# Patient Record
Sex: Male | Born: 1950 | Race: Black or African American | Hispanic: No | Marital: Married | Smoking: Never smoker
Health system: Southern US, Community
[De-identification: ages and names within clinical notes are randomized; demographics above are authoritative.]

## PROBLEM LIST (undated history)

## (undated) DIAGNOSIS — I10 Essential (primary) hypertension: Secondary | ICD-10-CM

## (undated) DIAGNOSIS — Z9289 Personal history of other medical treatment: Secondary | ICD-10-CM

## (undated) DIAGNOSIS — K219 Gastro-esophageal reflux disease without esophagitis: Secondary | ICD-10-CM

## (undated) DIAGNOSIS — D472 Monoclonal gammopathy: Secondary | ICD-10-CM

## (undated) DIAGNOSIS — G629 Polyneuropathy, unspecified: Secondary | ICD-10-CM

## (undated) DIAGNOSIS — K635 Polyp of colon: Secondary | ICD-10-CM

## (undated) DIAGNOSIS — S46009A Unspecified injury of muscle(s) and tendon(s) of the rotator cuff of unspecified shoulder, initial encounter: Secondary | ICD-10-CM

## (undated) DIAGNOSIS — I2510 Atherosclerotic heart disease of native coronary artery without angina pectoris: Secondary | ICD-10-CM

## (undated) DIAGNOSIS — J189 Pneumonia, unspecified organism: Secondary | ICD-10-CM

## (undated) DIAGNOSIS — G5600 Carpal tunnel syndrome, unspecified upper limb: Secondary | ICD-10-CM

## (undated) DIAGNOSIS — E785 Hyperlipidemia, unspecified: Secondary | ICD-10-CM

## (undated) HISTORY — DX: Carpal tunnel syndrome, unspecified upper limb: G56.00

## (undated) HISTORY — DX: Polyp of colon: K63.5

## (undated) HISTORY — DX: Polyneuropathy, unspecified: G62.9

## (undated) HISTORY — PX: OTHER SURGICAL HISTORY: SHX169

## (undated) HISTORY — PX: WRIST GANGLION EXCISION: SHX840

## (undated) HISTORY — DX: Gastro-esophageal reflux disease without esophagitis: K21.9

## (undated) HISTORY — DX: Unspecified injury of muscle(s) and tendon(s) of the rotator cuff of unspecified shoulder, initial encounter: S46.009A

## (undated) HISTORY — DX: Personal history of other medical treatment: Z92.89

## (undated) HISTORY — DX: Monoclonal gammopathy: D47.2

## (undated) HISTORY — DX: Hyperlipidemia, unspecified: E78.5

## (undated) HISTORY — DX: Essential (primary) hypertension: I10

## (undated) HISTORY — DX: Pneumonia, unspecified organism: J18.9

---

## 1987-09-30 DIAGNOSIS — M67431 Ganglion, right wrist: Secondary | ICD-10-CM

## 1987-09-30 HISTORY — PX: GANGLION CYST EXCISION: SHX1691

## 1987-09-30 HISTORY — DX: Ganglion, right wrist: M67.431

## 1998-03-08 ENCOUNTER — Encounter: Admission: RE | Admit: 1998-03-08 | Discharge: 1998-06-06 | Payer: Self-pay | Admitting: Family Medicine

## 2007-09-30 DIAGNOSIS — E119 Type 2 diabetes mellitus without complications: Secondary | ICD-10-CM

## 2007-09-30 HISTORY — DX: Type 2 diabetes mellitus without complications: E11.9

## 2009-03-01 ENCOUNTER — Ambulatory Visit: Payer: Self-pay | Admitting: Internal Medicine

## 2009-03-01 ENCOUNTER — Inpatient Hospital Stay (HOSPITAL_COMMUNITY): Admission: EM | Admit: 2009-03-01 | Discharge: 2009-03-03 | Payer: Self-pay | Admitting: Emergency Medicine

## 2009-03-02 ENCOUNTER — Ambulatory Visit: Payer: Self-pay | Admitting: Vascular Surgery

## 2009-03-02 ENCOUNTER — Encounter (INDEPENDENT_AMBULATORY_CARE_PROVIDER_SITE_OTHER): Payer: Self-pay | Admitting: Internal Medicine

## 2009-12-07 HISTORY — PX: OTHER SURGICAL HISTORY: SHX169

## 2011-01-06 LAB — URINALYSIS, MICROSCOPIC ONLY
Glucose, UA: NEGATIVE mg/dL
Hgb urine dipstick: NEGATIVE
Leukocytes, UA: NEGATIVE
Protein, ur: NEGATIVE mg/dL
Specific Gravity, Urine: 1.028 (ref 1.005–1.030)
pH: 6 (ref 5.0–8.0)

## 2011-01-06 LAB — CBC
MCHC: 33.1 g/dL (ref 30.0–36.0)
Platelets: 148 10*3/uL — ABNORMAL LOW (ref 150–400)
RBC: 4.93 MIL/uL (ref 4.22–5.81)
RDW: 13.7 % (ref 11.5–15.5)
WBC: 8.1 10*3/uL (ref 4.0–10.5)

## 2011-01-06 LAB — POCT CARDIAC MARKERS
CKMB, poc: 1.8 ng/mL (ref 1.0–8.0)
Troponin i, poc: <0.05 ng/mL (ref 0.00–0.09)

## 2011-01-06 LAB — DIFFERENTIAL
Basophils Relative: 0 % (ref 0–1)
Lymphs Abs: 1.7 10*3/uL (ref 0.7–4.0)
Monocytes Absolute: 0.6 10*3/uL (ref 0.1–1.0)
Monocytes Relative: 7 % (ref 3–12)
Neutro Abs: 5.8 10*3/uL (ref 1.7–7.7)
Neutrophils Relative %: 71 % (ref 43–77)

## 2011-01-06 LAB — COMPREHENSIVE METABOLIC PANEL
ALT: 42 U/L (ref 0–53)
Albumin: 4.6 g/dL (ref 3.5–5.2)
Alkaline Phosphatase: 40 U/L (ref 39–117)
BUN: 21 mg/dL (ref 6–23)
Calcium: 9.8 mg/dL (ref 8.4–10.5)
Potassium: 4.1 mEq/L (ref 3.5–5.1)
Sodium: 139 mEq/L (ref 135–145)
Total Protein: 8.4 g/dL — ABNORMAL HIGH (ref 6.0–8.3)

## 2011-01-06 LAB — GLUCOSE, CAPILLARY
Glucose-Capillary: 113 mg/dL — ABNORMAL HIGH (ref 70–99)
Glucose-Capillary: 159 mg/dL — ABNORMAL HIGH (ref 70–99)
Glucose-Capillary: 189 mg/dL — ABNORMAL HIGH (ref 70–99)
Glucose-Capillary: 241 mg/dL — ABNORMAL HIGH (ref 70–99)

## 2011-01-06 LAB — BASIC METABOLIC PANEL
BUN: 18 mg/dL (ref 6–23)
Calcium: 9.4 mg/dL (ref 8.4–10.5)
GFR calc non Af Amer: >60 mL/min (ref >60)
Glucose, Bld: 180 mg/dL — ABNORMAL HIGH (ref 70–99)
Sodium: 136 mEq/L (ref 135–145)

## 2011-01-06 LAB — LIPID PANEL
Cholesterol: 161 mg/dL (ref 0–200)
LDL Cholesterol: 95 mg/dL (ref 0–99)

## 2011-01-06 LAB — PROTIME-INR: INR: 1 (ref 0.00–1.49)

## 2011-01-06 LAB — CARDIAC PANEL(CRET KIN+CKTOT+MB+TROPI)
Relative Index: 0.7 (ref 0.0–2.5)
Troponin I: 0.01 ng/mL (ref 0.00–0.06)

## 2011-01-06 LAB — APTT: aPTT: 29 seconds (ref 24–37)

## 2011-01-06 LAB — HOMOCYSTEINE: Homocysteine: 11.1 umol/L (ref 4.0–15.4)

## 2011-02-11 NOTE — H&P (Signed)
NAME:  Jerome Vaughn, Jerome Vaughn                    ACCOUNT NO.:  [id number]   MEDICAL RECORD NO.:  [id number]          PATIENT TYPE:  INP   LOCATION:  1419                         FACILITY:  [facility]   PHYSICIAN:  Corinna L. Lendell Caprice, MDDATE OF BIRTH:  [birth year]   DATE OF ADMISSION:  03/01/2009  DATE OF DISCHARGE:                              HISTORY & PHYSICAL   CHIEF COMPLAINT:  Left shoulder pain and left leg and foot tingling.   HPI:  Jerome Vaughn is a 60 year old black male with a history of shoulder  pain starting Monday.  He had been lifting heavy objects over the  weekend.  He was unable to really move the arm at all.  He also noted  this morning that his left leg started tingling from the knee up and  then subsequently spread to his foot, particularly paresthesias of the  plantar surface.  He also felt numbness there.  This sensation has  resolved.  He had no other symptoms with this.   PAST MEDICAL HISTORY:  1. Diabetes.  2. Hypertension.  3. Hyperlipidemia.   SOCIAL HISTORY:  He smokes cigarettes.  He drinks occasionally.  He  denies drugs.  He is married.   FAMILY HISTORY:  His mother died at age 74 of a head injury.  His father  died at age 62.  His brother had a stroke.  He has a sister with  diabetes and another sister with renal failure.  One brother died of  some type of aneurysm.   MEDICATIONS:  1. Glipizide ER 10 mg a Buonocore.  2. Metformin ER 500 mg 1 tablet q.i.d.  3. Crestor 40 mg a Mccleave.  4. Lisinopril 10 mg a Cory.  5. Actos 30 mg a Sawchuk.  6. Aspirin 81 mg a Newlun.   All systems reviewed and is negative other than above.   PHYSICAL EXAMINATION:  Temperature is 97.5.  Blood pressure 118/70.  Heart rate 75.  Oxygen saturation 96%.  GENERAL:  Patient is well nourished, well developed, in no acute  distress.  HEENT:  He has facial symmetry.  Pupils equal, round, reactive to light.  Sclerae nonicteric.  Moist mucous membranes.  NECK:  Supple.  No carotid bruits.  LUNGS:  Clear to  auscultation bilaterally without wheezes, rhonchi, or  rales.  CARDIOVASCULAR:  Regular rate and rhythm without murmurs, gallops, or  rubs.  ABDOMEN:  Soft, nontender, nondistended.  GU:  Deferred.  RECTAL:  Deferred.  EXTREMITIES:  No clubbing, cyanosis, or edema.  NEUROLOGIC:  He is alert and oriented x3.  Cranial nerves are intact.  Motor strength 5/5 in all extremities except for the left due to pain.  Deep tendon reflexes.  Gait normal.  Finger-to-nose normal on the right.  Speech is clear and fluent.  MUSCULOSKELETAL:  He has decreased range of motion with abduction of the  left shoulder with active or passive movement.  SKIN:  No rash.  PSYCHIATRIC:  Normal affect.   LABS:  CBC is unremarkable.  Basic metabolic panel unremarkable.  Cardiac enzymes negative.  Liver function tests unremarkable.  EKG shows  normal sinus rhythm, LVH.  CT of the brain shows nothing acute.  CT of  the C-spine done in the ER shows spondylosis, otherwise essentially  negative.  Left shoulder x-ray, 2 views, shows minimal early [facility] joint  osteoarthritic change.  Two views of the chest show minimally enlarged  cardiac silhouette, nothing acute.   ASSESSMENT AND PLAN:  1. Transient left leg and foot paresthesia, resolved, question      transient ischemic attack, question radiculopathy:  Patient will be      admitted.  He will get an MRI/MRA of the brain, carotid Dopplers,      echocardiogram, check fasting lipids, homocysteine level.  I will      increase his aspirin to 325 mg a Grunewald.  He will be monitored on      telemetry.  2. Left shoulder injury, suspect rotator cuff injury.  He will get      nonsteroidal anti-inflammatories and patient requesting inpatient      Orthopedic evaluation which is reasonable as he is having a      difficult time performing daily activities.  3. Type 2 diabetes.  Continue outpatient medications.  Check      hemoglobin A1c.  4. Hypertension.  Continue lisinopril.  5.  Hyperlipidemia.  Continue statin.      Corinna L. Lendell Caprice, MD  Electronically Signed     CLS/MEDQ  D:  03/02/2009  T:  03/02/2009  Job:  [id number]

## 2011-02-11 NOTE — Consult Note (Signed)
NAME:  Jerome Vaughn, Jerome Vaughn NO.:  [id number]   MEDICAL RECORD NO.:  [id number]          PATIENT TYPE:  INP   LOCATION:  1419                         FACILITY:  [facility]   PHYSICIAN:  Myrtie Neither, MD      DATE OF BIRTH:  1951/06/14   DATE OF CONSULTATION:  03/02/2009  DATE OF DISCHARGE:                                 CONSULTATION   REFERRING PHYSICIAN:  Dr. Crista Curb   REASON FOR CONSULTATION:  Left rotator cuff injury.   </   HISTORY OF PRESENT ILLNESS:  This is a 60 year old black male who states  that over the past 2-3 days he has had severe pain, swelling and loss of  function in left shoulder.  Patient states that he was helping someone  lift some paneling above the chest level last week.  Patient states that  the pain is from the shoulder down to the elbow but not below.  He is  experiencing pain on minimal extension or rotation of his shoulder,  unable to sleep last night.   PAST MEDICAL HISTORY:  1. Diabetes mellitus.  2. Hypertension.  3. Hyperlipidemia.  4. History of gouty arthritis.   SOCIAL HISTORY:  Patient does smoke less than 1 pack a Bolio, occasional  use of alcohol, denies use of illegal drugs.   MEDICATIONS:  1. Glipizide ER 10 mg daily.  2. Metformin ER 500 mg q.i.d.  3. Crestor 40 mg daily.  4. Lisinopril 10 mg a Roberg.  5. Actos 30 mg a Weiand.  6. Aspirin 81 mg a Cegielski.   REVIEW OF SYSTEMS:  Patient was also having symptoms of tingling and  paresthesias involving lower extremity, particularly the left lower  extremity.   PHYSICAL EXAMINATION:  GENERAL:  Patient is alert and oriented, in no  acute distress, sitting guarding the left upper extremity.  VITAL SIGNS:  Temperature 97.5, blood pressure 118/70, pulse 75,  respirations 18.  NECK:  Supple, range of motion good.  LEFT SHOULDER:  Obvious deltoid bursa swelling markedly tender, no  increase in warmth, very limited range of motion both actively and  passively.  Good grip  tension.  __________ intact left upper extremity.  Marked tenderness is __________ at the subacromial space.   X-ray revealed osteophyte degenerative changes at the [facility] joint, no acute  changes.   IMPRESSION:  1. Acute subdeltoid subacromial bursitis.  2. Impingement syndrome of the shoulder.  3. Possible gouty arthritis.   RECOMMENDATIONS:  Ice pack __________.  Will have uric acid level  checked for gouty arthropathy.  Decadron 4 mg IV tonight.  Indomethacin  50 mg b.i.d.  Will follow up tomorrow.  Will see back in the office in 1  week.  If symptoms persist, then we may have __________.      Myrtie Neither, MD  Electronically Signed     AC/MEDQ  D:  03/02/2009  T:  03/03/2009  Job:  [id number]

## 2011-02-14 NOTE — Discharge Summary (Signed)
NAME:  CARRY, ORTEZ                    ACCOUNT NO.:  [id number]   MEDICAL RECORD NO.:  [id number]          PATIENT TYPE:  INP   LOCATION:  1419                         [facility]ACILITY:  [facility]   PHYSICIAN:  Corinna L. Lendell Caprice, MDDATE OF BIRTH:  1951/08/10   DATE OF ADMISSION:  03/01/2009  DATE OF DISCHARGE:  03/03/2009                               DISCHARGE SUMMARY   DISCHARGE DIAGNOSES:  1. Transient left leg paresthesias.  2. Left shoulder pain, acute subdeltoid subacromial bursitis with      impingement syndrome of the shoulder.  3. Type 2 diabetes.  4. Hyperlipidemia.  5. Hypertension.   DISCHARGE MEDICATIONS:  1. Increase aspirin to 325 mg a Horseman.  2. Indocin 50 mg twice a Saur for 7 days.  3. Continue glipizide ER 10 mg a Granillo.  4. Metformin 1000 mg twice a Lame.  5. Lisinopril 10 mg daily.  6. Actos 30 mg a Baby.   CONDITION:  Stable.   FOLLOWUP:  Follow up with Dr. Montez Morita on June 11.  Follow up with primary  care physician.   DISCHARGE INSTRUCTIONS:  1. Diet is diabetic, heart-healthy.  2. No activity restrictions.   CONSULTATIONS:  Dr. Montez Morita.   PROCEDURES:  None.   LABORATORY DATA:  CBC significant for platelet count of 148, otherwise,  unremarkable.  Basic metabolic panel:  Glucose 122, hemoglobin A1c 6.4.  Liver function tests significant for a total protein of 8.4, uric acid  level six.  Homocystine 11.  Cardiac enzymes significant for a CPK of  267, hemoglobin A1c was 6.4, LDL 95, HDL 43, triglycerides 114.  Urinalysis showed negative nitrite negative leukocyte esterase negative  protein, negative blood, hemoglobin A1c was 6.4.   SPECIAL STUDIES RADIOLOGY:  CT of the C-spine showed nothing acute  spondylosis, degenerative disk disease and facet disease throughout,  mild to moderate multilevel neural foraminal narrowing on the left due  to facet disease.  CT brain showed nothing acute.  Left shoulder x-ray  showed minimal early [facility] joint osteoarthritic change.  Chest  x-ray two  views showed minimally enlarged cardiac silhouette, nothing acute, mild  degenerative spondylosis compatible with age.  MRI of the brain showed  nothing acute, small vessel changes, prominent soft tissue in the  nasopharynx posteriorly initially representing lymphoid tissue, but  cannot rule out neoplastic process.  MRA of the brain was normal.  Carotid Dopplers showed no significant ischemia.  Vertebral arteries  were antegrade flow.  Echocardiogram showed ejection fraction of 55%,  increased wall thickness of the left ventricle.  No source of embolus.  EKG showed normal sinus rhythm and LVH.   HISTORY AND HOSPITAL COURSE:  Mr. Mccauslin is a 60 year old black male who  presented with left shoulder pain.  The pain started after heavy  lifting.  While in the emergency room, he also complained of  paresthesias on the on his left foot and thigh, but this was a secondary  complaint.  The ED physician was concerned about TIA, and noted that the  patient had difficulty with range of motion and pain involving the  left  shoulder.  The patient was admitted for TIA workup.  He was unable to  move his left arm much due to pain, but had otherwise normal neurologic  exam.  His symptoms resolved.  It was felt that his transient left leg  paresthesias could be radiculopathy or TIA.  His aspirin was increased  to 325 and a TIA workup was unremarkable.  Dr. Montez Morita was consulted for  the shoulder pain.  Please see his dictation.  He recommended IV steroid  and nonsteroidal anti-inflammatories.  Also, will follow up in the  office as an outpatient.  By the time of discharge, the patient was  feeling better and stable for discharge.      Corinna L. Lendell Caprice, MD  Electronically Signed     CLS/MEDQ  D:  03/29/2009  T:  03/29/2009  Job:  [id number]

## 2011-03-28 ENCOUNTER — Encounter: Payer: Self-pay | Admitting: Internal Medicine

## 2011-03-28 ENCOUNTER — Ambulatory Visit (INDEPENDENT_AMBULATORY_CARE_PROVIDER_SITE_OTHER): Payer: 59 | Admitting: Internal Medicine

## 2011-03-28 DIAGNOSIS — D126 Benign neoplasm of colon, unspecified: Secondary | ICD-10-CM

## 2011-03-28 DIAGNOSIS — Z0000 Encounter for general adult medical examination without abnormal findings: Secondary | ICD-10-CM

## 2011-03-28 DIAGNOSIS — E119 Type 2 diabetes mellitus without complications: Secondary | ICD-10-CM

## 2011-03-28 DIAGNOSIS — I10 Essential (primary) hypertension: Secondary | ICD-10-CM

## 2011-03-28 DIAGNOSIS — Z23 Encounter for immunization: Secondary | ICD-10-CM

## 2011-03-28 DIAGNOSIS — K635 Polyp of colon: Secondary | ICD-10-CM

## 2011-03-28 DIAGNOSIS — F17200 Nicotine dependence, unspecified, uncomplicated: Secondary | ICD-10-CM

## 2011-03-28 DIAGNOSIS — E785 Hyperlipidemia, unspecified: Secondary | ICD-10-CM

## 2011-03-28 DIAGNOSIS — Z125 Encounter for screening for malignant neoplasm of prostate: Secondary | ICD-10-CM

## 2011-03-28 LAB — BASIC METABOLIC PANEL
CO2: 26 mEq/L (ref 19–32)
Chloride: 104 mEq/L (ref 96–112)
Creatinine, Ser: 1 mg/dL (ref 0.4–1.5)
Potassium: 4.1 mEq/L (ref 3.5–5.1)

## 2011-03-28 LAB — MICROALBUMIN / CREATININE URINE RATIO
Creatinine,U: 268.2 mg/dL
Microalb Creat Ratio: 0.6 mg/g (ref 0.0–30.0)
Microalb, Ur: 1.6 mg/dL (ref 0.0–1.9)

## 2011-03-28 LAB — CBC WITH DIFFERENTIAL/PLATELET
Basophils Absolute: 0 10*3/uL (ref 0.0–0.1)
Eosinophils Absolute: 0 10*3/uL (ref 0.0–0.7)
Hemoglobin: 14.4 g/dL (ref 13.0–17.0)
Lymphocytes Relative: 35.3 % (ref 12.0–46.0)
MCHC: 34.6 g/dL (ref 30.0–36.0)
Monocytes Relative: 7.6 % (ref 3.0–12.0)
Neutro Abs: 2.7 10*3/uL (ref 1.4–7.7)
Neutrophils Relative %: 55.8 % (ref 43.0–77.0)
RDW: 13.2 % (ref 11.5–14.6)
WBC: 4.8 10*3/uL (ref 4.5–10.5)

## 2011-03-28 LAB — LIPID PANEL
Cholesterol: 339 mg/dL — ABNORMAL HIGH (ref 0–200)
HDL: 50.2 mg/dL (ref >39.00)
Total CHOL/HDL Ratio: 7
Triglycerides: 264 mg/dL — ABNORMAL HIGH (ref 0.0–149.0)
VLDL: 52.8 mg/dL — ABNORMAL HIGH (ref 0.0–40.0)

## 2011-03-28 LAB — HEPATIC FUNCTION PANEL
Albumin: 4.4 g/dL (ref 3.5–5.2)
Alkaline Phosphatase: 42 U/L (ref 39–117)
Total Protein: 7.9 g/dL (ref 6.0–8.3)

## 2011-03-28 LAB — HEMOGLOBIN A1C: Hgb A1c MFr Bld: 9.1 % — ABNORMAL HIGH (ref 4.6–6.5)

## 2011-03-28 LAB — PSA: PSA: 0.3 ng/mL (ref 0.10–4.00)

## 2011-03-28 MED ORDER — LISINOPRIL 20 MG PO TABS: 20.0000 mg | ORAL_TABLET | Freq: Every day | ORAL | Status: DC

## 2011-03-28 MED ORDER — METFORMIN HCL ER 500 MG PO TB24: ORAL_TABLET | ORAL | Status: DC

## 2011-03-28 MED ORDER — GLIPIZIDE 10 MG PO TABS: 10.0000 mg | ORAL_TABLET | Freq: Every day | ORAL | Status: DC

## 2011-03-28 MED ORDER — ROSUVASTATIN CALCIUM 40 MG PO TABS: 40.0000 mg | ORAL_TABLET | Freq: Every day | ORAL | Status: DC

## 2011-03-28 MED ORDER — PIOGLITAZONE HCL 30 MG PO TABS: 30.0000 mg | ORAL_TABLET | Freq: Every day | ORAL | Status: DC

## 2011-03-30 DIAGNOSIS — K635 Polyp of colon: Secondary | ICD-10-CM | POA: Insufficient documentation

## 2011-03-30 DIAGNOSIS — F17200 Nicotine dependence, unspecified, uncomplicated: Secondary | ICD-10-CM | POA: Insufficient documentation

## 2011-03-30 DIAGNOSIS — E7800 Pure hypercholesterolemia, unspecified: Secondary | ICD-10-CM | POA: Insufficient documentation

## 2011-03-30 DIAGNOSIS — E785 Hyperlipidemia, unspecified: Secondary | ICD-10-CM | POA: Insufficient documentation

## 2011-03-30 DIAGNOSIS — I10 Essential (primary) hypertension: Secondary | ICD-10-CM | POA: Insufficient documentation

## 2011-03-30 NOTE — Progress Notes (Signed)
  Subjective:    Patient ID: Jerome Vaughn, male    DOB: November 26, 1950, 60 y.o.   MRN: [id number]  HPI patient presents to clinic to establish primary care and for followup of diabetes. States fingerstick blood sugars typically in the 96 without hypoglycemia. Last recalled labs approximately 5 months ago. No polyuria or polydipsia and denies feet paresthesias. Currently maintained on p.o. Diabetic medications. Is aware of potential side effects of Actos. Eye exam up-to-date March 2012. Does smoke however only smokes approximately 3 cigarettes daily. Blood pressure elevated in clinic without complaint of headache or dizziness. Monitor his blood pressure at home and has been entirely normotensive. Believes colonoscopy 2011 with one polyp with recommended 5 year followup. No active complaint  Reviewed past medical history, past surgical history, medications, allergies, social history and family history    Review of Systems  Respiratory: Negative for cough and shortness of breath.   Cardiovascular: Negative for chest pain.  Genitourinary: Negative for frequency and difficulty urinating.  Neurological: Negative for dizziness and headaches.  All other systems reviewed and are negative.       Objective:   Physical Exam    Physical Exam  Vitals reviewed. Constitutional:  appears well-developed and well-nourished. No distress.  HENT:  Head: Normocephalic and atraumatic.  Right Ear: Tympanic membrane, external ear and ear canal normal.  Left Ear: Tympanic membrane, external ear and ear canal normal.  Nose: Nose normal.  Mouth/Throat: Oropharynx is clear and moist. No oropharyngeal exudate.  Eyes: Conjunctivae and EOM are normal. Pupils are equal, round, and reactive to light. Right eye exhibits no discharge. Left eye exhibits no discharge. No scleral icterus.  Neck: Neck supple. No thyromegaly present.  Cardiovascular: Normal rate, regular rhythm and normal heart sounds.  Exam reveals no gallop and  no friction rub.   No murmur heard. Pulmonary/Chest: Effort normal and breath sounds normal. No respiratory distress.  has no wheezes.  has no rales.  Lymphadenopathy:   no cervical adenopathy.  Neurological:  is alert.  Skin: Skin is warm and dry.  not diaphoretic.  Psychiatric: normal mood and affect.  Diabetic foot exam: No wounds, ulcerations or significant callusing. Monofilament exam normal   Assessment & Plan:

## 2011-03-30 NOTE — Assessment & Plan Note (Signed)
Historically good control. Obtain CBC, Chem-7, A1c and urine microalbumin. Did discuss potential for changing actos. Patient aware of potential side effects. Patient will consider

## 2011-03-30 NOTE — Assessment & Plan Note (Signed)
Normotensive and stable. Continue current regimen. Isolated elevation in clinic today.

## 2011-03-30 NOTE — Assessment & Plan Note (Signed)
Obtain fasting lipid profile and liver function tests. 

## 2011-03-30 NOTE — Assessment & Plan Note (Signed)
Counseled regarding the need for cessation. Patient states understanding

## 2011-04-04 ENCOUNTER — Telehealth: Payer: Self-pay

## 2011-04-04 NOTE — Telephone Encounter (Signed)
Pt notified and verbalized understanding. 3 week follow up appointment scheduled with Dr. Leonard Schwartz

## 2011-04-04 NOTE — Telephone Encounter (Signed)
Message copied by Beverely Low on Fri Apr 04, 2011 10:04 AM ------      Message from: Staci Righter      Created: Wed Apr 02, 2011  6:07 PM       Sugar avg well above 200 (not what he was reporting at home). Also cholesterol is very high (reportedly taking crestor 40). Confirm taking medications as prescribed. Forward fsbs after one week. Needs f/u appt within 3wks

## 2011-04-21 ENCOUNTER — Ambulatory Visit: Payer: 59 | Admitting: Family Medicine

## 2011-05-01 ENCOUNTER — Encounter: Payer: Self-pay | Admitting: Family Medicine

## 2011-05-01 ENCOUNTER — Ambulatory Visit (INDEPENDENT_AMBULATORY_CARE_PROVIDER_SITE_OTHER): Payer: 59 | Admitting: Family Medicine

## 2011-05-01 VITALS — BP 106/70 | HR 78 | Temp 98.0°F | Wt 203.0 lb

## 2011-05-01 DIAGNOSIS — E785 Hyperlipidemia, unspecified: Secondary | ICD-10-CM

## 2011-05-01 DIAGNOSIS — I10 Essential (primary) hypertension: Secondary | ICD-10-CM

## 2011-05-01 DIAGNOSIS — E119 Type 2 diabetes mellitus without complications: Secondary | ICD-10-CM

## 2011-05-01 MED ORDER — METFORMIN HCL 500 MG PO TABS: 500.0000 mg | ORAL_TABLET | Freq: Four times a day (QID) | ORAL | Status: DC

## 2011-05-01 NOTE — Progress Notes (Signed)
  Subjective:    Patient ID: Jerome Vaughn, male    DOB: 04/03/1951, 60 y.o.   MRN: [id number]  HPI Here to establish with me after switching from Dr. Rodena Medin and to follow up on DM and lipids. He was here on 03-28-11 and had fasting labs drawn. These were remarkable for an A1c of 9.1 , and LDL of 235, and a TG of 264. His BP has been stable. He admits today that he had let his diet slip and that he had been out of meds for about 3 weeks. He feels fine. His last A1c in December 2011 was 6.9.    Review of Systems  Constitutional: Negative.   Respiratory: Negative.   Cardiovascular: Negative.        Objective:   Physical Exam  Constitutional: He appears well-developed and well-nourished.  Neck: No thyromegaly present.  Cardiovascular: Normal rate, regular rhythm, normal heart sounds and intact distal pulses.   Pulmonary/Chest: Effort normal and breath sounds normal.  Lymphadenopathy:    He has no cervical adenopathy.          Assessment & Plan:  He will continue his diet plan. meds were refilled. Recheck an A1c in 6 months

## 2011-05-02 ENCOUNTER — Encounter: Payer: Self-pay | Admitting: Family Medicine

## 2011-05-09 ENCOUNTER — Encounter: Payer: Self-pay | Admitting: Family Medicine

## 2011-05-09 ENCOUNTER — Ambulatory Visit (INDEPENDENT_AMBULATORY_CARE_PROVIDER_SITE_OTHER): Payer: 59 | Admitting: Family Medicine

## 2011-05-09 VITALS — BP 128/78 | HR 68 | Temp 97.6°F | Wt 201.0 lb

## 2011-05-09 DIAGNOSIS — L259 Unspecified contact dermatitis, unspecified cause: Secondary | ICD-10-CM

## 2011-05-09 DIAGNOSIS — N529 Male erectile dysfunction, unspecified: Secondary | ICD-10-CM

## 2011-05-09 MED ORDER — PREDNISONE (PAK) 10 MG PO TABS: ORAL_TABLET | ORAL | Status: DC

## 2011-05-09 MED ORDER — TADALAFIL 5 MG PO TABS: 5.0000 mg | ORAL_TABLET | Freq: Every day | ORAL | Status: DC

## 2011-05-09 NOTE — Progress Notes (Signed)
  Subjective:    Patient ID: Jerome Vaughn, male    DOB: 10/02/1950, 60 y.o.   MRN: [id number]  HPI Here for 2 weeks of an itchy rash over both arms and both legs. This started after he cleared some brush from his property. Using Benadryl and topical steroid creams. He is also interested in trying daily Cialis. He has used prn Cialis in the past.    Review of Systems  Constitutional: Negative.   Respiratory: Negative.   Skin: Positive for rash.       Objective:   Physical Exam  Constitutional: He appears well-developed and well-nourished.  Pulmonary/Chest: Effort normal and breath sounds normal.  Skin:       Scattered red papulovesicular lesions as above          Assessment & Plan:  Use a steroid dose pack.

## 2011-10-02 ENCOUNTER — Other Ambulatory Visit: Payer: Self-pay | Admitting: Internal Medicine

## 2011-10-03 ENCOUNTER — Other Ambulatory Visit: Payer: Self-pay | Admitting: Family Medicine

## 2011-10-03 NOTE — Telephone Encounter (Signed)
Pt called to check on status of getting refills for lisinopril (PRINIVIL,ZESTRIL) 20 MG tablet, metFORMIN (GLUCOPHAGE) 500 MG,glipiZIDE (GLUCOTROL) 10 MG tablet to [facility] on [address].

## 2011-10-03 NOTE — Telephone Encounter (Signed)
Pt is out of all med

## 2011-10-03 NOTE — Telephone Encounter (Signed)
Refill all these for one year  

## 2011-10-06 ENCOUNTER — Telehealth: Payer: Self-pay | Admitting: Family Medicine

## 2011-10-06 MED ORDER — LISINOPRIL 20 MG PO TABS: 20.0000 mg | ORAL_TABLET | Freq: Every day | ORAL | Status: DC

## 2011-10-06 MED ORDER — PIOGLITAZONE HCL 30 MG PO TABS: 30.0000 mg | ORAL_TABLET | Freq: Every day | ORAL | Status: DC

## 2011-10-06 MED ORDER — GLIPIZIDE ER 10 MG PO TB24: 10.0000 mg | ORAL_TABLET | Freq: Every day | ORAL | Status: DC

## 2011-10-06 NOTE — Telephone Encounter (Signed)
Pt. Called back again. He is agitated that this has not been done. I told him we have up to 72 business hrs to complete. He asked his pharmacy to send [place] refill requests 12.29. They did not until 1/3. At this point, he is out of all his meds. Please complete today if at all possible. Patient requests you call him at [phone] when done.

## 2011-10-06 NOTE — Telephone Encounter (Signed)
Pt contacted pharmacy about refills they said they have not received anything from [place]. Please re call in scripts to pharmacy

## 2011-10-06 NOTE — Telephone Encounter (Signed)
Scripts sent e-scribe and pt is aware.

## 2011-10-15 ENCOUNTER — Encounter: Payer: Self-pay | Admitting: Family Medicine

## 2011-10-15 ENCOUNTER — Ambulatory Visit (INDEPENDENT_AMBULATORY_CARE_PROVIDER_SITE_OTHER): Payer: 59 | Admitting: Family Medicine

## 2011-10-15 VITALS — BP 128/86 | HR 82 | Temp 98.5°F | Wt 205.0 lb

## 2011-10-15 DIAGNOSIS — E785 Hyperlipidemia, unspecified: Secondary | ICD-10-CM

## 2011-10-15 DIAGNOSIS — E119 Type 2 diabetes mellitus without complications: Secondary | ICD-10-CM

## 2011-10-15 DIAGNOSIS — I10 Essential (primary) hypertension: Secondary | ICD-10-CM

## 2011-10-15 LAB — POCT URINALYSIS DIPSTICK
Blood, UA: NEGATIVE
Glucose, UA: NEGATIVE
Spec Grav, UA: 1.02
Urobilinogen, UA: 0.2
pH, UA: 6.5

## 2011-10-15 NOTE — Progress Notes (Signed)
  Subjective:    Patient ID: Jerome Vaughn, male    DOB: Mar 23, 1951, 61 y.o.   MRN: [id number]  HPI Here to follow up. He has felt fine with no concerns. Watching his diet and exercising. His am fasting glucoses have been in the range of 110-120.    Review of Systems  Constitutional: Negative.   Respiratory: Negative.   Cardiovascular: Negative.        Objective:   Physical Exam  Constitutional: He appears well-developed and well-nourished.  Neck: No thyromegaly present.  Cardiovascular: Normal rate, regular rhythm, normal heart sounds and intact distal pulses.   Pulmonary/Chest: Effort normal and breath sounds normal.  Lymphadenopathy:    He has no cervical adenopathy.          Assessment & Plan:  Set up fasting labs soon. His HTN is stable.

## 2011-10-16 LAB — HEPATIC FUNCTION PANEL
AST: 42 U/L — ABNORMAL HIGH (ref 0–37)
Albumin: 4.7 g/dL (ref 3.5–5.2)
Alkaline Phosphatase: 42 U/L (ref 39–117)
Bilirubin, Direct: 0.1 mg/dL (ref 0.0–0.3)
Total Protein: 8 g/dL (ref 6.0–8.3)

## 2011-10-16 LAB — BASIC METABOLIC PANEL
CO2: 27 mEq/L (ref 19–32)
Calcium: 10.4 mg/dL (ref 8.4–10.5)
Chloride: 101 mEq/L (ref 96–112)
Glucose, Bld: 92 mg/dL (ref 70–99)
Potassium: 4.5 mEq/L (ref 3.5–5.1)
Sodium: 138 mEq/L (ref 135–145)

## 2011-10-16 LAB — CBC WITH DIFFERENTIAL/PLATELET
Basophils Relative: 0.5 % (ref 0.0–3.0)
Eosinophils Absolute: 0 10*3/uL (ref 0.0–0.7)
Eosinophils Relative: 0.7 % (ref 0.0–5.0)
HCT: 42.3 % (ref 39.0–52.0)
Lymphs Abs: 1.9 10*3/uL (ref 0.7–4.0)
MCHC: 34.3 g/dL (ref 30.0–36.0)
MCV: 90.6 fl (ref 78.0–100.0)
Monocytes Absolute: 0.5 10*3/uL (ref 0.1–1.0)
RBC: 4.66 Mil/uL (ref 4.22–5.81)
WBC: 6.3 10*3/uL (ref 4.5–10.5)

## 2011-10-16 LAB — LIPID PANEL
Total CHOL/HDL Ratio: 3
Triglycerides: 87 mg/dL (ref 0.0–149.0)

## 2011-10-17 ENCOUNTER — Encounter: Payer: Self-pay | Admitting: Family Medicine

## 2011-10-17 NOTE — Progress Notes (Signed)
Quick Note:  Spoke with pt and put a copy in mail. ______ 

## 2012-01-22 ENCOUNTER — Encounter (INDEPENDENT_AMBULATORY_CARE_PROVIDER_SITE_OTHER): Payer: 59 | Admitting: Ophthalmology

## 2012-01-22 DIAGNOSIS — H3530 Unspecified macular degeneration: Secondary | ICD-10-CM

## 2012-01-22 DIAGNOSIS — E11319 Type 2 diabetes mellitus with unspecified diabetic retinopathy without macular edema: Secondary | ICD-10-CM

## 2012-01-22 DIAGNOSIS — H2510 Age-related nuclear cataract, unspecified eye: Secondary | ICD-10-CM

## 2012-01-22 DIAGNOSIS — E1165 Type 2 diabetes mellitus with hyperglycemia: Secondary | ICD-10-CM

## 2012-01-22 DIAGNOSIS — H43819 Vitreous degeneration, unspecified eye: Secondary | ICD-10-CM

## 2012-01-22 DIAGNOSIS — E1139 Type 2 diabetes mellitus with other diabetic ophthalmic complication: Secondary | ICD-10-CM

## 2012-03-17 ENCOUNTER — Other Ambulatory Visit: Payer: Self-pay | Admitting: Internal Medicine

## 2012-03-21 ENCOUNTER — Other Ambulatory Visit: Payer: Self-pay | Admitting: Family Medicine

## 2012-05-02 ENCOUNTER — Other Ambulatory Visit: Payer: Self-pay | Admitting: Internal Medicine

## 2012-05-03 NOTE — Telephone Encounter (Signed)
He takes 2 tablets twice a Wichmann, so call in #[phone] with 11 rf

## 2012-05-03 NOTE — Telephone Encounter (Signed)
Please clarify how many times a Maisel for this medication?

## 2012-05-04 ENCOUNTER — Other Ambulatory Visit: Payer: Self-pay

## 2012-05-04 ENCOUNTER — Other Ambulatory Visit: Payer: Self-pay | Admitting: Family Medicine

## 2012-05-29 ENCOUNTER — Other Ambulatory Visit: Payer: Self-pay | Admitting: Family Medicine

## 2012-06-02 ENCOUNTER — Other Ambulatory Visit: Payer: Self-pay | Admitting: Family Medicine

## 2012-07-03 ENCOUNTER — Other Ambulatory Visit: Payer: Self-pay | Admitting: Family Medicine

## 2012-10-01 ENCOUNTER — Other Ambulatory Visit: Payer: Self-pay | Admitting: Family Medicine

## 2012-12-05 ENCOUNTER — Other Ambulatory Visit: Payer: Self-pay | Admitting: Family Medicine

## 2013-01-21 ENCOUNTER — Ambulatory Visit (INDEPENDENT_AMBULATORY_CARE_PROVIDER_SITE_OTHER): Payer: 59 | Admitting: Ophthalmology

## 2013-02-09 ENCOUNTER — Ambulatory Visit (INDEPENDENT_AMBULATORY_CARE_PROVIDER_SITE_OTHER): Payer: Self-pay | Admitting: Ophthalmology

## 2013-02-24 ENCOUNTER — Ambulatory Visit (INDEPENDENT_AMBULATORY_CARE_PROVIDER_SITE_OTHER): Payer: 59 | Admitting: Ophthalmology

## 2013-02-24 DIAGNOSIS — E1165 Type 2 diabetes mellitus with hyperglycemia: Secondary | ICD-10-CM

## 2013-02-24 DIAGNOSIS — H3530 Unspecified macular degeneration: Secondary | ICD-10-CM

## 2013-02-24 DIAGNOSIS — H2510 Age-related nuclear cataract, unspecified eye: Secondary | ICD-10-CM

## 2013-02-24 DIAGNOSIS — E1139 Type 2 diabetes mellitus with other diabetic ophthalmic complication: Secondary | ICD-10-CM

## 2013-02-24 DIAGNOSIS — E11319 Type 2 diabetes mellitus with unspecified diabetic retinopathy without macular edema: Secondary | ICD-10-CM

## 2013-02-24 DIAGNOSIS — H43819 Vitreous degeneration, unspecified eye: Secondary | ICD-10-CM

## 2013-04-08 ENCOUNTER — Encounter (INDEPENDENT_AMBULATORY_CARE_PROVIDER_SITE_OTHER): Payer: 59 | Admitting: Ophthalmology

## 2013-05-19 ENCOUNTER — Emergency Department (HOSPITAL_COMMUNITY)
Admission: EM | Admit: 2013-05-19 | Discharge: 2013-05-20 | Disposition: A | Discharge status: Home / Self Care | Payer: 59 | Attending: Emergency Medicine | Admitting: Emergency Medicine

## 2013-05-19 ENCOUNTER — Encounter (HOSPITAL_COMMUNITY): Payer: Self-pay

## 2013-05-19 DIAGNOSIS — E785 Hyperlipidemia, unspecified: Secondary | ICD-10-CM | POA: Insufficient documentation

## 2013-05-19 DIAGNOSIS — F17200 Nicotine dependence, unspecified, uncomplicated: Secondary | ICD-10-CM | POA: Insufficient documentation

## 2013-05-19 DIAGNOSIS — K219 Gastro-esophageal reflux disease without esophagitis: Secondary | ICD-10-CM

## 2013-05-19 DIAGNOSIS — Z7982 Long term (current) use of aspirin: Secondary | ICD-10-CM | POA: Insufficient documentation

## 2013-05-19 DIAGNOSIS — Z79899 Other long term (current) drug therapy: Secondary | ICD-10-CM | POA: Insufficient documentation

## 2013-05-19 DIAGNOSIS — E119 Type 2 diabetes mellitus without complications: Secondary | ICD-10-CM | POA: Insufficient documentation

## 2013-05-19 DIAGNOSIS — R110 Nausea: Secondary | ICD-10-CM | POA: Insufficient documentation

## 2013-05-19 DIAGNOSIS — I10 Essential (primary) hypertension: Secondary | ICD-10-CM | POA: Insufficient documentation

## 2013-05-19 DIAGNOSIS — K2970 Gastritis, unspecified, without bleeding: Secondary | ICD-10-CM

## 2013-05-19 LAB — URINALYSIS, ROUTINE W REFLEX MICROSCOPIC
Bilirubin Urine: NEGATIVE
Glucose, UA: >1000 mg/dL — AB
Ketones, ur: NEGATIVE mg/dL
Leukocytes, UA: NEGATIVE
Nitrite: NEGATIVE
Specific Gravity, Urine: 1.034 — ABNORMAL HIGH (ref 1.005–1.030)
pH: 6.5 (ref 5.0–8.0)

## 2013-05-19 LAB — CBC WITH DIFFERENTIAL/PLATELET
Basophils Relative: 0 % (ref 0–1)
Eosinophils Absolute: 0.1 10*3/uL (ref 0.0–0.7)
HCT: 40.6 % (ref 39.0–52.0)
Hemoglobin: 14.1 g/dL (ref 13.0–17.0)
Lymphs Abs: 2.2 10*3/uL (ref 0.7–4.0)
MCH: 29.7 pg (ref 26.0–34.0)
MCHC: 34.7 g/dL (ref 30.0–36.0)
MCV: 85.7 fL (ref 78.0–100.0)
Monocytes Absolute: 0.5 10*3/uL (ref 0.1–1.0)
Monocytes Relative: 8 % (ref 3–12)
Neutrophils Relative %: 52 % (ref 43–77)
RBC: 4.74 MIL/uL (ref 4.22–5.81)

## 2013-05-19 LAB — URINE MICROSCOPIC-ADD ON

## 2013-05-19 LAB — POCT I-STAT, CHEM 8
BUN: 20 mg/dL (ref 6–23)
Chloride: 102 mEq/L (ref 96–112)
Glucose, Bld: 273 mg/dL — ABNORMAL HIGH (ref 70–99)
Potassium: 4.1 mEq/L (ref 3.5–5.1)

## 2013-05-19 LAB — LIPASE, BLOOD: Lipase: 61 U/L — ABNORMAL HIGH (ref 11–59)

## 2013-05-19 NOTE — ED Notes (Signed)
Pt complains of upper abd pain for three days, no vomiting or diarrhea but states he was nauseated at first, the pain has decreased some but the pain has shifted to the left side of the abdomen

## 2013-05-20 ENCOUNTER — Emergency Department (HOSPITAL_COMMUNITY): Payer: 59

## 2013-05-20 MED ORDER — OMEPRAZOLE 20 MG PO CPDR: 20.0000 mg | DELAYED_RELEASE_CAPSULE | Freq: Every day | ORAL | Status: DC

## 2013-05-20 MED ORDER — GI COCKTAIL ~~LOC~~
30.0000 mL | Freq: Once | ORAL | Status: AC
  Administered 2013-05-20: 30 mL via ORAL
  Filled 2013-05-20: qty 30

## 2013-05-20 NOTE — ED Provider Notes (Signed)
CSN: [id number]     Arrival date & time 05/19/13  2116 History     First MD Initiated Contact with Patient 05/20/13 0209     Chief Complaint  Patient presents with  . Abdominal Pain   (Consider location/radiation/quality/duration/timing/severity/associated sxs/prior Treatment) The history is provided by the patient and the spouse. No language interpreter was used.  Vuk Skillern Stairs is a 62 y/o M with PMHx of HTN, HLD, DM presenting to the ED, with wife, with abdominal pain that has been ongoing for the past 3 days. Patient reported that the abdominal pain is localized to the right upper quadrant, described as a pain, that radiates to the left side of the chest and left lower rib region. Patient reported that the pain worsens after he eats - reported that he does not eat much of fatty, greasy foods. Reported feeling nauseous. Denied fever, chest pain, shortness of breath, difficulty breathing, melena, hematochezia, vomiting, diarrhea, urinary complaints. PCP Dr. Claris Che   Past Medical History  Diagnosis Date  . Diabetes mellitus   . Hyperlipidemia   . Hypertension    Past Surgical History  Procedure Laterality Date  . Wrist ganglion excision     Family History  Problem Relation Age of Onset  . Hyperlipidemia Sister   . Diabetes Sister   . Hypertension Sister   . Anuerysm Brother   . Diabetes Maternal Grandmother   . Hyperlipidemia Sister   . Kidney disease Sister    History  Substance Use Topics  . Smoking status: Current Every Jahn Smoker -- 0.30 packs/[place]    Types: Cigarettes  . Smokeless tobacco: Never Used  . Alcohol Use: 1.5 oz/week    3 drink(s) per week    Review of Systems  Constitutional: Negative for fever and chills.  Respiratory: Negative for chest tightness and shortness of breath.   Cardiovascular: Negative for chest pain.  Gastrointestinal: Positive for nausea and abdominal pain.  Genitourinary: Negative for decreased urine volume.  Neurological: Negative for  weakness and headaches.  All other systems reviewed and are negative.    Allergies  Review of patient's allergies indicates no known allergies.  Home Medications   Current Outpatient Rx  Name  Route  Sig  Dispense  Refill  . aspirin 81 MG tablet   Oral   Take 81 mg by mouth daily.           Marland Kitchen CINNAMON PO   Oral   Take 2,000 mg by mouth.          Marland Kitchen glipiZIDE (GLUCOTROL XL) 10 MG 24 hr tablet   Oral   Take 10 mg by mouth daily.         Marland Kitchen lisinopril (PRINIVIL,ZESTRIL) 20 MG tablet   Oral   Take 20 mg by mouth daily.         . metFORMIN (GLUCOPHAGE-XR) 500 MG 24 hr tablet   Oral   Take 2,000 mg by mouth daily with breakfast.         . Multiple Vitamin (MULTIVITAMIN) tablet   Oral   Take 1 tablet by mouth daily.           . rosuvastatin (CRESTOR) 40 MG tablet   Oral   Take 40 mg by mouth daily.         Marland Kitchen omeprazole (PRILOSEC) 20 MG capsule   Oral   Take 1 capsule (20 mg total) by mouth daily.   5 capsule   0    BP 125/88  Pulse 61  Temp(Src) 97.8 F (36.6 C) (Oral)  Resp 14  SpO2 96% Physical Exam  Nursing note and vitals reviewed. Constitutional: He is oriented to person, place, and time. He appears well-developed and well-nourished.  HENT:  Head: Normocephalic and atraumatic.  Eyes: Conjunctivae and EOM are normal. Pupils are equal, round, and reactive to light. Right eye exhibits no discharge. Left eye exhibits no discharge.  Neck: Normal range of motion. Neck supple.  Cardiovascular: Normal rate, regular rhythm and normal heart sounds.  Exam reveals no friction rub.   No murmur heard. Pulses:      Radial pulses are 2+ on the right side, and 2+ on the left side.       Dorsalis pedis pulses are 2+ on the right side, and 2+ on the left side.  Pulmonary/Chest: Effort normal and breath sounds normal. No respiratory distress. He has no wheezes. He has no rales.  Abdominal: Bowel sounds are normal. He exhibits no distension. There is no  hepatosplenomegaly. There is tenderness in the right upper quadrant. There is positive Murphy's sign. There is no rigidity, no rebound, no guarding and no tenderness at McBurney's point.    Lymphadenopathy:    He has no cervical adenopathy.  Neurological: He is alert and oriented to person, place, and time.  Skin: Skin is warm and dry. No rash noted. No erythema.  Psychiatric: He has a normal mood and affect. His behavior is normal. Thought content normal.    ED Course   Procedures (including critical care time)  4:54 AM Discussed with patient and wife labs and imaging findings in great detail. All questions answered. Discussed with patient suspicion high for gastritis/GERD. Educated patient with gastritis and GERD is. Discussed with patient proper diet and but to stay away from. Patient reported that he does eat a lot of spicy foods and greasy foods. Patient reported that the GI cocktail aided in the discomfort. Patient ready to go home.  Medications  gi cocktail (Maalox,Lidocaine,Donnatal) (30 mLs Oral Given 05/20/13 0316)    Labs Reviewed  CBC WITH DIFFERENTIAL - Abnormal; Notable for the following:    Platelets 135 (*)    All other components within normal limits  LIPASE, BLOOD - Abnormal; Notable for the following:    Lipase 61 (*)    All other components within normal limits  URINALYSIS, ROUTINE W REFLEX MICROSCOPIC - Abnormal; Notable for the following:    Specific Gravity, Urine 1.034 (*)    Glucose, UA >1000 (*)    All other components within normal limits  POCT I-STAT, CHEM 8 - Abnormal; Notable for the following:    Glucose, Bld 273 (*)    All other components within normal limits  AMYLASE  URINE MICROSCOPIC-ADD ON   No results found. 1. Gastritis   2. GERD (gastroesophageal reflux disease)     MDM  Patient presenting to the emergency department with abdominal pain has been ongoing for the past 3 days localized to the right upper quadrant with migration towards  the left side of the chest and lower rib cage. Alert and oriented. Negative acute abdomen, negative peritoneal signs. Positive Murphy's sign. Negative McBurney's. Bowel sounds normoactive in all 4 quadrants.  CBC negative elevation of WBC-doubt infection.Chem-8 negative findings. Urine negative for infection, negative signs of hemoglobin. Amylase and lipase negative elevation. Ultrasound of abdomen negative findings for gallstones - nonobstructing stone in the left kidney noted with a cyst on the right kidney, right kidney cyst measuring approximately 7 cm  in diameter. Doubt appendicitis. Doubt pyelonephritis. Doubt kidney stones. Patient reported that discomfort improved with GI cocktail. Suspicion high for gastritis and possible GERD. Patient stable, afebrile. Discharge patient with PPIs. Referred patient to primary care provider and gastroenterology. Discussed with patient diet. Discussed with patient to stay hydrated. Discussed with patient to continue to monitor symptoms and if symptoms are to worsen or change to report back to emergency department - return instructions given. Patient agreed to plan of care, understood, all questions answered.  Raymon Mutton, PA-C 05/22/13 1825

## 2013-05-23 LAB — GLUCOSE, CAPILLARY

## 2013-05-23 NOTE — ED Provider Notes (Signed)
Medical screening examination/treatment/procedure(s) were performed by non-physician practitioner and as supervising physician I was immediately available for consultation/collaboration.  Psalm Schappell M Rozann Holts, MD 05/23/13 2053 

## 2013-06-10 ENCOUNTER — Other Ambulatory Visit: Payer: Self-pay | Admitting: Family Medicine

## 2013-08-20 ENCOUNTER — Other Ambulatory Visit: Payer: Self-pay | Admitting: Family Medicine

## 2013-08-27 ENCOUNTER — Other Ambulatory Visit: Payer: Self-pay | Admitting: Family Medicine

## 2013-08-29 NOTE — Telephone Encounter (Signed)
Looks like pt needs office visit, can we do any refills?

## 2013-09-05 ENCOUNTER — Ambulatory Visit (INDEPENDENT_AMBULATORY_CARE_PROVIDER_SITE_OTHER): Payer: 59 | Admitting: Family Medicine

## 2013-09-05 ENCOUNTER — Encounter: Payer: Self-pay | Admitting: Family Medicine

## 2013-09-05 VITALS — BP 124/80 | HR 77 | Temp 98.3°F | Wt 198.0 lb

## 2013-09-05 DIAGNOSIS — N401 Enlarged prostate with lower urinary tract symptoms: Secondary | ICD-10-CM

## 2013-09-05 DIAGNOSIS — E785 Hyperlipidemia, unspecified: Secondary | ICD-10-CM

## 2013-09-05 DIAGNOSIS — N138 Other obstructive and reflux uropathy: Secondary | ICD-10-CM

## 2013-09-05 DIAGNOSIS — I10 Essential (primary) hypertension: Secondary | ICD-10-CM

## 2013-09-05 DIAGNOSIS — E119 Type 2 diabetes mellitus without complications: Secondary | ICD-10-CM

## 2013-09-05 DIAGNOSIS — N139 Obstructive and reflux uropathy, unspecified: Secondary | ICD-10-CM

## 2013-09-05 LAB — CBC WITH DIFFERENTIAL/PLATELET
Basophils Relative: 0.5 % (ref 0.0–3.0)
Eosinophils Absolute: 0.1 10*3/uL (ref 0.0–0.7)
Eosinophils Relative: 1.1 % (ref 0.0–5.0)
Lymphocytes Relative: 41.6 % (ref 12.0–46.0)
MCHC: 34.2 g/dL (ref 30.0–36.0)
Neutrophils Relative %: 48.5 % (ref 43.0–77.0)
Platelets: 130 10*3/uL — ABNORMAL LOW (ref 150.0–400.0)
RBC: 4.64 Mil/uL (ref 4.22–5.81)
WBC: 5.1 10*3/uL (ref 4.5–10.5)

## 2013-09-05 LAB — HEMOGLOBIN A1C: Hgb A1c MFr Bld: 11.2 % — ABNORMAL HIGH (ref 4.6–6.5)

## 2013-09-05 LAB — LIPID PANEL
Cholesterol: 321 mg/dL — ABNORMAL HIGH (ref 0–200)
HDL: 50.9 mg/dL (ref >39.00)
Triglycerides: 268 mg/dL — ABNORMAL HIGH (ref 0.0–149.0)
VLDL: 53.6 mg/dL — ABNORMAL HIGH (ref 0.0–40.0)

## 2013-09-05 LAB — BASIC METABOLIC PANEL
BUN: 14 mg/dL (ref 6–23)
Calcium: 9.7 mg/dL (ref 8.4–10.5)
Creatinine, Ser: 0.9 mg/dL (ref 0.4–1.5)
GFR: 106.55 mL/min (ref >60.00)
Potassium: 3.8 mEq/L (ref 3.5–5.1)

## 2013-09-05 LAB — PSA: PSA: 0.25 ng/mL (ref 0.10–4.00)

## 2013-09-05 LAB — HEPATIC FUNCTION PANEL: Albumin: 4.5 g/dL (ref 3.5–5.2)

## 2013-09-05 MED ORDER — TADALAFIL 20 MG PO TABS: 20.0000 mg | ORAL_TABLET | Freq: Every day | ORAL | Status: DC | PRN

## 2013-09-05 MED ORDER — ROSUVASTATIN CALCIUM 40 MG PO TABS: ORAL_TABLET | ORAL | Status: DC

## 2013-09-05 MED ORDER — METFORMIN HCL ER 500 MG PO TB24: 2000.0000 mg | ORAL_TABLET | Freq: Every day | ORAL | Status: DC

## 2013-09-05 NOTE — Progress Notes (Signed)
Pre visit review using our clinic review tool, if applicable. No additional management support is needed unless otherwise documented below in the visit note. 

## 2013-09-05 NOTE — Progress Notes (Signed)
   Subjective:    Patient ID: Jerome Vaughn, male    DOB: 1951-06-22, 62 y.o.   MRN: [id number]  HPI Here to follow up after a 2 year absence. He has felt well, and he says his BP and glucoses have been stable. He is fasting today.    Review of Systems  Constitutional: Negative.   Respiratory: Negative.   Cardiovascular: Negative.        Objective:   Physical Exam  Constitutional: He appears well-developed and well-nourished.  Cardiovascular: Normal rate, regular rhythm, normal heart sounds and intact distal pulses.   Pulmonary/Chest: Effort normal and breath sounds normal.  Lymphadenopathy:    He has no cervical adenopathy.          Assessment & Plan:  Get labs today including an A1c. He will set up a cpx after the first of next year.

## 2013-09-06 LAB — POCT URINALYSIS DIPSTICK
Bilirubin, UA: NEGATIVE
Leukocytes, UA: NEGATIVE
Nitrite, UA: NEGATIVE
Urobilinogen, UA: 0.2

## 2013-09-06 LAB — LDL CHOLESTEROL, DIRECT: Direct LDL: 204.8 mg/dL

## 2013-09-07 NOTE — Addendum Note (Signed)
Addended by: Gershon Crane A on: 09/07/2013 06:08 PM   Modules accepted: Orders

## 2013-09-13 ENCOUNTER — Ambulatory Visit: Payer: 59 | Admitting: Endocrinology

## 2013-10-04 ENCOUNTER — Ambulatory Visit: Payer: 59 | Admitting: Endocrinology

## 2013-10-04 ENCOUNTER — Ambulatory Visit (INDEPENDENT_AMBULATORY_CARE_PROVIDER_SITE_OTHER): Payer: 59 | Admitting: Endocrinology

## 2013-10-04 ENCOUNTER — Encounter: Payer: Self-pay | Admitting: Endocrinology

## 2013-10-04 VITALS — BP 128/86 | HR 86 | Temp 98.0°F | Ht 67.0 in | Wt 203.0 lb

## 2013-10-04 DIAGNOSIS — E1165 Type 2 diabetes mellitus with hyperglycemia: Principal | ICD-10-CM

## 2013-10-04 DIAGNOSIS — IMO0001 Reserved for inherently not codable concepts without codable children: Secondary | ICD-10-CM

## 2013-10-04 MED ORDER — GLIPIZIDE ER 10 MG PO TB24: 10.0000 mg | ORAL_TABLET | Freq: Every day | ORAL | Status: DC

## 2013-10-04 MED ORDER — METFORMIN HCL ER 500 MG PO TB24: 2000.0000 mg | ORAL_TABLET | Freq: Every day | ORAL | Status: DC

## 2013-10-04 MED ORDER — PIOGLITAZONE HCL 45 MG PO TABS: 45.0000 mg | ORAL_TABLET | Freq: Every day | ORAL | Status: DC

## 2013-10-04 NOTE — Patient Instructions (Addendum)
good diet and exercise habits significanly improve the control of your diabetes.  please let me know if you wish to be referred to a dietician.  high blood sugar is very risky to your health.  you should see an eye doctor every year.  You are at higher than average risk for pneumonia and hepatitis-B.  You should be vaccinated against both.   controlling your blood pressure and cholesterol drastically reduces the damage diabetes does to your body.  this also applies to quitting smoking.  please discuss these with your doctor.   check your blood sugar once a [place].  vary the time of [place] when you check, between before the 3 meals, and at bedtime.  also check if you have symptoms of your blood sugar being too high or too low.  please keep a record of the readings and bring it to your next appointment here.  You can write it on any piece of paper.  please call us sooner if your blood sugar goes below 70, or if you have a lot of readings over 200.   i have sent a prescription to your pharmacy, to add back the "actos."  Please come back for a follow-up appointment in 3 months.

## 2013-10-04 NOTE — Progress Notes (Signed)
Subjective:    Patient ID: Jerome Vaughn, male    DOB: 08/11/1951, 63 y.o.   MRN: [id number]  HPI pt states DM was dx'ed in 2002; he has mild if any neuropathy of the lower extremities; he is unaware of any associated chronic complications.  he has never been on insulin.  pt says his diet and exercise are . He says he had been off of his DM meds for a few months, leading up to his recent A1c.  Since back on meds, cbg's are in the mid-100's.   Past Medical History  Diagnosis Date  . Diabetes mellitus   . Hyperlipidemia   . Hypertension     Past Surgical History  Procedure Laterality Date  . Wrist ganglion excision      History   Social History  . Marital Status: Single    Spouse Name: N/A    Number of Children: N/A  . Years of Education: N/A   Occupational History  . Not on file.   Social History Main Topics  . Smoking status: Current Some [place] Smoker    Types: Cigarettes  . Smokeless tobacco: Never Used  . Alcohol Use: 1.5 oz/week    3 drink(s) per week  . Drug Use: No  . Sexual Activity: Not on file   Other Topics Concern  . Not on file   Social History Narrative  . No narrative on file    Current Outpatient Prescriptions on File Prior to Visit  Medication Sig Dispense Refill  . aspirin 81 MG tablet Take 81 mg by mouth daily.       Marland Kitchen CINNAMON PO Take 2,000 mg by mouth.       Marland Kitchen glipiZIDE (GLUCOTROL XL) 10 MG 24 hr tablet Take 10 mg by mouth daily.      Marland Kitchen lisinopril (PRINIVIL,ZESTRIL) 20 MG tablet Take 20 mg by mouth daily.      . metFORMIN (GLUCOPHAGE-XR) 500 MG 24 hr tablet Take 4 tablets (2,000 mg total) by mouth daily with breakfast.  120 tablet  2  . Multiple Vitamin (MULTIVITAMIN) tablet Take 1 tablet by mouth daily.        Marland Kitchen omeprazole (PRILOSEC) 20 MG capsule Take 1 capsule (20 mg total) by mouth daily.  5 capsule  0  . rosuvastatin (CRESTOR) 40 MG tablet TAKE AS DIRECTED FOR CHOLESTROL  30 tablet  2  . tadalafil (CIALIS) 20 MG tablet Take 1 tablet (20 mg  total) by mouth daily as needed for erectile dysfunction.  10 tablet  2  . tadalafil (CIALIS) 5 MG tablet Take 1 tablet (5 mg total) by mouth daily.  30 tablet  11   No current facility-administered medications on file prior to visit.    No Known Allergies  Family History  Problem Relation Age of Onset  . Hyperlipidemia Sister   . Diabetes Sister   . Hypertension Sister   . Anuerysm Brother   . Diabetes Maternal Grandmother   . Hyperlipidemia Sister   . Kidney disease Sister   DM: 2 sibs  BP 128/86  Pulse 86  Temp(Src) 98 F (36.7 C) (Oral)  Ht 5\' 7"  (1.702 m)  Wt 203 lb (92.08 kg)  BMI 31.79 kg/m2  SpO2 95%  Review of Systems denies blurry vision, headache, chest pain, sob, n/v, urinary frequency, cramps, excessive diaphoresis, memory loss, depression, hypoglycemia, rhinorrhea, and easy bruising.  He has weight gain.      Objective:   Physical Exam VS: see vs  page GEN: no distress HEAD: head: no deformity eyes: no periorbital swelling, no proptosis external nose and ears are normal mouth: no lesion seen NECK: supple, thyroid is not enlarged CHEST WALL: no deformity LUNGS: clear to auscultation BREASTS:  No gynecomastia CV: reg rate and rhythm, no murmur ABD: abdomen is soft, nontender.  no hepatosplenomegaly.  not distended.  no hernia.   MUSCULOSKELETAL: muscle bulk and strength are grossly normal.  no obvious joint swelling.  gait is normal and steady. PULSES:  no carotid bruit NEURO:  cn 2-12 grossly intact.   readily moves all 4's.   SKIN:  Normal texture and temperature.  No rash or suspicious lesion is visible.   NODES:  None palpable at the neck PSYCH: alert, well-oriented.  Does not appear anxious nor depressed.  Lab Results  Component Value Date   HGBA1C 11.2* 09/05/2013      Assessment & Plan:  DM: very poor control. Insulin will most likely be needed. Noncompliance with meds,  This compromises the rx of DM. Weight gain: this also limits the rx of  DM.

## 2013-10-05 ENCOUNTER — Telehealth: Payer: Self-pay | Admitting: Family Medicine

## 2013-10-05 NOTE — Telephone Encounter (Signed)
Request to change medication due to cost. Pt currently on Lisinopril 20 mg and pt would like to change to Benazepril, if appropriate? Please send new script to [facility].

## 2013-10-06 NOTE — Telephone Encounter (Signed)
Switch from Lisinopril to Benazepril 20 mg daily and fill for one year

## 2013-10-07 NOTE — Telephone Encounter (Signed)
I tried to reach pt by phone, no answer or option to leave a message. I need to know if pt wants to make this change?

## 2013-10-10 MED ORDER — BENAZEPRIL HCL 20 MG PO TABS: 20.0000 mg | ORAL_TABLET | Freq: Every day | ORAL | Status: DC

## 2013-10-10 NOTE — Telephone Encounter (Signed)
I spoke with pt and he would like to make the change and I did send new script e-scribe.

## 2013-11-03 ENCOUNTER — Telehealth: Payer: Self-pay | Admitting: Family Medicine

## 2013-11-03 NOTE — Telephone Encounter (Signed)
[facility] requesting to change rosuvastatin (CRESTOR) 40 MG tablet to Atorvastatin due to lower cost.

## 2013-11-04 ENCOUNTER — Telehealth: Payer: Self-pay | Admitting: Family Medicine

## 2013-11-04 NOTE — Telephone Encounter (Signed)
I faxed this response to [facility].

## 2013-11-04 NOTE — Telephone Encounter (Signed)
Pt requesting to change from Crestor to Atorvastatin due to cost and send to [facility].

## 2013-11-04 NOTE — Telephone Encounter (Signed)
I want him to stay on Crestor because even the highest dose of Atorvastatin will not be strong enough to help him

## 2013-11-04 NOTE — Telephone Encounter (Signed)
Per Dr. Sarajane Jews, pt should stay on Crestor because even the highest dose of Atorvastatin will not be strong enough to help pt. I did fax this back to [facility].

## 2013-11-11 MED ORDER — ROSUVASTATIN CALCIUM 40 MG PO TABS: 40.0000 mg | ORAL_TABLET | Freq: Every day | ORAL | Status: DC

## 2013-11-11 NOTE — Telephone Encounter (Signed)
Pt would like to know why dr fry denied his crestor. Would like a cb

## 2013-11-11 NOTE — Telephone Encounter (Signed)
I spoke with pt and per his request sent in script e-scribe for the Crestor.

## 2013-11-11 NOTE — Addendum Note (Signed)
Addended by: Aggie Hacker A on: 11/11/2013 04:36 PM   Modules accepted: Orders

## 2014-04-24 ENCOUNTER — Telehealth: Payer: Self-pay | Admitting: *Deleted

## 2014-04-24 ENCOUNTER — Encounter: Payer: Self-pay | Admitting: *Deleted

## 2014-04-24 NOTE — Telephone Encounter (Signed)
Unable to reach patient by phone. Letter sent to home address.

## 2014-08-21 ENCOUNTER — Ambulatory Visit: Payer: 59 | Admitting: Family Medicine

## 2014-08-22 ENCOUNTER — Ambulatory Visit (INDEPENDENT_AMBULATORY_CARE_PROVIDER_SITE_OTHER): Payer: 59 | Admitting: Family Medicine

## 2014-08-22 ENCOUNTER — Encounter: Payer: Self-pay | Admitting: Family Medicine

## 2014-08-22 VITALS — BP 140/96 | HR 72 | Temp 97.5°F | Ht 67.0 in | Wt 211.0 lb

## 2014-08-22 DIAGNOSIS — Z23 Encounter for immunization: Secondary | ICD-10-CM

## 2014-08-22 DIAGNOSIS — I10 Essential (primary) hypertension: Secondary | ICD-10-CM

## 2014-08-22 DIAGNOSIS — E119 Type 2 diabetes mellitus without complications: Secondary | ICD-10-CM

## 2014-08-22 LAB — CBC WITH DIFFERENTIAL/PLATELET
Basophils Absolute: 0 10*3/uL (ref 0.0–0.1)
Basophils Relative: 0.4 % (ref 0.0–3.0)
EOS PCT: 1.1 % (ref 0.0–5.0)
Eosinophils Absolute: 0.1 10*3/uL (ref 0.0–0.7)
HCT: 39.3 % (ref 39.0–52.0)
Hemoglobin: 13.1 g/dL (ref 13.0–17.0)
Lymphocytes Relative: 43 % (ref 12.0–46.0)
Lymphs Abs: 2.2 10*3/uL (ref 0.7–4.0)
MCHC: 33.3 g/dL (ref 30.0–36.0)
MCV: 88.2 fl (ref 78.0–100.0)
MONOS PCT: 11.4 % (ref 3.0–12.0)
Monocytes Absolute: 0.6 10*3/uL (ref 0.1–1.0)
NEUTROS PCT: 44.1 % (ref 43.0–77.0)
Neutro Abs: 2.2 10*3/uL (ref 1.4–7.7)
PLATELETS: 146 10*3/uL — AB (ref 150.0–400.0)
RBC: 4.46 Mil/uL (ref 4.22–5.81)
RDW: 13.7 % (ref 11.5–15.5)
WBC: 5.1 10*3/uL (ref 4.0–10.5)

## 2014-08-22 LAB — HEPATIC FUNCTION PANEL
ALT: 32 U/L (ref 0–53)
AST: 25 U/L (ref 0–37)
Albumin: 4.3 g/dL (ref 3.5–5.2)
Alkaline Phosphatase: 37 U/L — ABNORMAL LOW (ref 39–117)
BILIRUBIN TOTAL: 0.3 mg/dL (ref 0.2–1.2)
Bilirubin, Direct: 0 mg/dL (ref 0.0–0.3)
Total Protein: 8 g/dL (ref 6.0–8.3)

## 2014-08-22 LAB — BASIC METABOLIC PANEL
BUN: 24 mg/dL — AB (ref 6–23)
CO2: 27 mEq/L (ref 19–32)
CREATININE: 1.1 mg/dL (ref 0.4–1.5)
Calcium: 9.6 mg/dL (ref 8.4–10.5)
Chloride: 101 mEq/L (ref 96–112)
GFR: 86.59 mL/min (ref >60.00)
Glucose, Bld: 140 mg/dL — ABNORMAL HIGH (ref 70–99)
POTASSIUM: 4.3 meq/L (ref 3.5–5.1)
Sodium: 138 mEq/L (ref 135–145)

## 2014-08-22 LAB — HEMOGLOBIN A1C: HEMOGLOBIN A1C: 10.2 % — AB (ref 4.6–6.5)

## 2014-08-22 LAB — MICROALBUMIN / CREATININE URINE RATIO
Creatinine,U: 138.7 mg/dL
MICROALB UR: 1.2 mg/dL (ref 0.0–1.9)
MICROALB/CREAT RATIO: 0.9 mg/g (ref 0.0–30.0)

## 2014-08-22 LAB — LIPID PANEL
CHOL/HDL RATIO: 3
Cholesterol: 169 mg/dL (ref 0–200)
HDL: 51.8 mg/dL (ref >39.00)
LDL CALC: 88 mg/dL (ref 0–99)
NonHDL: 117.2
TRIGLYCERIDES: 146 mg/dL (ref 0.0–149.0)
VLDL: 29.2 mg/dL (ref 0.0–40.0)

## 2014-08-22 LAB — TSH: TSH: 0.99 u[IU]/mL (ref 0.35–4.50)

## 2014-08-22 NOTE — Progress Notes (Signed)
Pre visit review using our clinic review tool, if applicable. No additional management support is needed unless otherwise documented below in the visit note. 

## 2014-08-22 NOTE — Progress Notes (Signed)
   Subjective:    Patient ID: Jerome Vaughn, male    DOB: September 20, 1951, 63 y.o.   MRN: [id number]  HPI Here to follow up on HTN and diabetes. In December 2014 his labs showed that his A1c had jumped from 7 up to 11.2. We referred him to see Dr. Loanne Drilling and he saw him once only, and he did not follow up as was recommended. He feels well. His am fasting glucoses average in the 120s or 130s.   Review of Systems  Constitutional: Negative.   Respiratory: Negative.   Cardiovascular: Negative.        Objective:   Physical Exam  Constitutional: He appears well-developed and well-nourished.  Cardiovascular: Normal rate, regular rhythm, normal heart sounds and intact distal pulses.   Pulmonary/Chest: Effort normal and breath sounds normal.          Assessment & Plan:  Get fasting labs

## 2014-08-23 ENCOUNTER — Telehealth: Payer: Self-pay | Admitting: Family Medicine

## 2014-08-23 NOTE — Telephone Encounter (Signed)
emmi emailed °

## 2014-08-28 MED ORDER — CANAGLIFLOZIN 300 MG PO TABS: 300.0000 mg | ORAL_TABLET | Freq: Every day | ORAL | Status: DC

## 2014-08-28 MED ORDER — GLUCOSE BLOOD VI STRP: ORAL_STRIP | Status: DC

## 2014-08-28 MED ORDER — ACCU-CHEK AVIVA PLUS W/DEVICE KIT: PACK | Status: DC

## 2014-08-28 MED ORDER — ACCU-CHEK SOFT TOUCH LANCETS MISC: Status: DC

## 2014-08-28 NOTE — Addendum Note (Signed)
Addended by: Aggie Hacker A on: 08/28/2014 04:37 PM   Modules accepted: Orders, Medications

## 2014-08-28 NOTE — Addendum Note (Signed)
Addended by: Aggie Hacker A on: 08/28/2014 04:26 PM   Modules accepted: Medications

## 2014-09-08 ENCOUNTER — Other Ambulatory Visit: Payer: Self-pay | Admitting: Family Medicine

## 2014-09-08 ENCOUNTER — Other Ambulatory Visit: Payer: Self-pay | Admitting: Endocrinology

## 2014-09-08 NOTE — Telephone Encounter (Signed)
Please refill x 2 months Ov is due 

## 2014-09-08 NOTE — Telephone Encounter (Signed)
Please advise if ok to refill. Pt has note been seen since 10/04/2013. Thanks!

## 2014-09-08 NOTE — Telephone Encounter (Signed)
Rx sent to pharmacy   

## 2014-11-28 ENCOUNTER — Telehealth: Payer: Self-pay | Admitting: Family Medicine

## 2014-11-28 NOTE — Telephone Encounter (Signed)
Opened in error

## 2014-12-06 ENCOUNTER — Encounter: Payer: Self-pay | Admitting: Family Medicine

## 2014-12-06 ENCOUNTER — Ambulatory Visit (INDEPENDENT_AMBULATORY_CARE_PROVIDER_SITE_OTHER): Payer: BLUE CROSS/BLUE SHIELD | Admitting: Family Medicine

## 2014-12-06 VITALS — BP 140/88 | HR 70 | Temp 98.0°F | Wt 198.1 lb

## 2014-12-06 DIAGNOSIS — E785 Hyperlipidemia, unspecified: Secondary | ICD-10-CM

## 2014-12-06 DIAGNOSIS — I10 Essential (primary) hypertension: Secondary | ICD-10-CM

## 2014-12-06 DIAGNOSIS — E119 Type 2 diabetes mellitus without complications: Secondary | ICD-10-CM

## 2014-12-06 NOTE — Progress Notes (Signed)
   Subjective:    Patient ID: Jerome Vaughn, male    DOB: Dec 01, 1950, 64 y.o.   MRN: [id number]  HPI Here to follow up. He feels great. His BP at the pharmacy runs in the 120s or 130s over 80s. His am fasting glucoses run from 120 to 140. At his last visit we added Invokana to his Metformin and glipizide and Actos.    Review of Systems  Constitutional: Negative.   Respiratory: Negative.   Cardiovascular: Negative.        Objective:   Physical Exam  Constitutional: He is oriented to person, place, and time. He appears well-developed and well-nourished.  Neck: No thyromegaly present.  Cardiovascular: Normal rate, regular rhythm, normal heart sounds and intact distal pulses.   Pulmonary/Chest: Effort normal and breath sounds normal.  Musculoskeletal: He exhibits no edema.  Lymphadenopathy:    He has no cervical adenopathy.  Neurological: He is alert and oriented to person, place, and time.          Assessment & Plan:  His HTN is stable. Get an A1c today.

## 2014-12-06 NOTE — Progress Notes (Signed)
Pre visit review using our clinic review tool, if applicable. No additional management support is needed unless otherwise documented below in the visit note. 

## 2014-12-07 LAB — HEMOGLOBIN A1C: HEMOGLOBIN A1C: 8.2 % — AB (ref 4.6–6.5)

## 2014-12-07 NOTE — Addendum Note (Signed)
Addended by: Alysia Penna A on: 12/07/2014 12:59 PM   Modules accepted: Orders

## 2014-12-20 ENCOUNTER — Encounter: Payer: Self-pay | Admitting: Endocrinology

## 2014-12-20 ENCOUNTER — Ambulatory Visit (INDEPENDENT_AMBULATORY_CARE_PROVIDER_SITE_OTHER): Payer: BLUE CROSS/BLUE SHIELD | Admitting: Endocrinology

## 2014-12-20 VITALS — BP 124/82 | HR 78 | Temp 97.8°F | Ht 67.0 in | Wt 204.0 lb

## 2014-12-20 DIAGNOSIS — E119 Type 2 diabetes mellitus without complications: Secondary | ICD-10-CM | POA: Diagnosis not present

## 2014-12-20 MED ORDER — SAXAGLIPTIN HCL 5 MG PO TABS: 5.0000 mg | ORAL_TABLET | Freq: Every day | ORAL | Status: DC

## 2014-12-20 MED ORDER — GLIPIZIDE ER 2.5 MG PO TB24
2.5000 mg | ORAL_TABLET | Freq: Every day | ORAL | Status: DC
Start: 2014-12-20 — End: 2015-01-09

## 2014-12-20 NOTE — Progress Notes (Signed)
Subjective:    Patient ID: Jerome Vaughn, male    DOB: 04-Nov-1950, 64 y.o.   MRN: [id number]  HPI Pt returns for f/u of diabetes mellitus: DM type: 2 Dx'ed: 3016 Complications: none Therapy: 4 oral meds.  DKA: never Severe hypoglycemia: never Pancreatitis: never Other: he has never been on insulin Interval history: no cbg record, but states cbg's vary from 77-168.  pt states he feels well in general.  He takes meds as rx'ed.    Past Medical History  Diagnosis Date  . Diabetes mellitus   . Hyperlipidemia   . Hypertension     Past Surgical History  Procedure Laterality Date  . Wrist ganglion excision      History   Social History  . Marital Status: Single    Spouse Name: N/A  . Number of Children: N/A  . Years of Education: N/A   Occupational History  . Not on file.   Social History Main Topics  . Smoking status: Former Smoker    Types: Cigarettes    Quit date: 10/07/2013  . Smokeless tobacco: Never Used     Comment: quit cigarettes, might have a cigar 1 X month  . Alcohol Use: 1.8 oz/week    3 Standard drinks or equivalent per week  . Drug Use: No  . Sexual Activity: Not on file   Other Topics Concern  . Not on file   Social History Narrative    Current Outpatient Prescriptions on File Prior to Visit  Medication Sig Dispense Refill  . aspirin 81 MG tablet Take 81 mg by mouth daily.     . benazepril (LOTENSIN) 20 MG tablet Take 1 tablet by mouth  daily 90 tablet 0  . Blood Glucose Monitoring Suppl (ACCU-CHEK AVIVA PLUS) W/DEVICE KIT Test once per [place] 1 kit 0  . canagliflozin (INVOKANA) 300 MG TABS tablet Take 300 mg by mouth daily before breakfast. 30 tablet 11  . CINNAMON PO Take 2,000 mg by mouth.     Marland Kitchen glucose blood (ACCU-CHEK AVIVA PLUS) test strip Diagnosis code is E 11.9 100 each 0  . Lancets (ACCU-CHEK SOFT TOUCH) lancets Dispense for Aviva plus and diagnosis code is E 11.9 100 each 0  . metFORMIN (GLUCOPHAGE-XR) 500 MG 24 hr tablet Take 4 tablets  by mouth daily with breakfast. <APPOINTMENT NEEDED FOR FURTHER REFILLS> 360 tablet 0  . Multiple Vitamin (MULTIVITAMIN) tablet Take 1 tablet by mouth daily.      . Omega-3 Fatty Acids (FISH OIL) 1000 MG CAPS Take by mouth.    . pioglitazone (ACTOS) 45 MG tablet Take 1 tablet by mouth daily. < APPOINTMENT NEEDED FOR FURTHER REFILLS > 90 tablet 0  . rosuvastatin (CRESTOR) 40 MG tablet Take 1 tablet (40 mg total) by mouth daily. TAKE AS DIRECTED FOR CHOLESTROL 90 tablet 3   No current facility-administered medications on file prior to visit.    No Known Allergies  Family History  Problem Relation Age of Onset  . Hyperlipidemia Sister   . Diabetes Sister   . Hypertension Sister   . Anuerysm Brother   . Diabetes Maternal Grandmother   . Hyperlipidemia Sister   . Kidney disease Sister     BP 124/82 mmHg  Pulse 78  Temp(Src) 97.8 F (36.6 C) (Oral)  Ht '5\' 7"'  (1.702 m)  Wt 204 lb (92.534 kg)  BMI 31.94 kg/m2  SpO2 95%    Review of Systems He denies hypoglycemia.  he has gained weight.  Objective:   Physical Exam VITAL SIGNS:  See vs page GENERAL: no distress Pulses: dorsalis pedis intact bilat.   MSK: no deformity of the feet CV: no leg edema Skin:  no ulcer on the feet.  normal color and temp on the feet. Neuro: sensation is intact to touch on the feet    Lab Results  Component Value Date   HGBA1C 8.2* 12/06/2014      Assessment & Plan:  DM: worse Hypoglycemia: new: we need to reduce glipizide, despite increased a1c Weight gain: he is advised to re-lose: onglyza may help also  Patient is advised the following: Patient Instructions  check your blood sugar once a [place].  vary the time of [place] when you check, between before the 3 meals, and at bedtime.  also check if you have symptoms of your blood sugar being too high or too low.  please keep a record of the readings and bring it to your next appointment here.  You can write it on any piece of paper.  please call  us sooner if your blood sugar goes below 70, or if you have a lot of readings over 200.    Please come back for a follow-up appointment in 2 months.   i have sent 2 prescriptions to your pharmacy: to reduce the glipizide, and to add "onglyza."

## 2014-12-20 NOTE — Patient Instructions (Addendum)
check your blood sugar once a Jerome Vaughn.  vary the time of [place] when you check, between before the 3 meals, and at bedtime.  also check if you have symptoms of your blood sugar being too high or too low.  please keep a record of the readings and bring it to your next appointment here.  You can write it on any piece of paper.  please call us sooner if your blood sugar goes below 70, or if you have a lot of readings over 200.    Please come back for a follow-up appointment in 2 months.   i have sent 2 prescriptions to your pharmacy: to reduce the glipizide, and to add "onglyza."

## 2014-12-28 ENCOUNTER — Other Ambulatory Visit: Payer: Self-pay | Admitting: Family Medicine

## 2014-12-28 ENCOUNTER — Other Ambulatory Visit: Payer: Self-pay | Admitting: Endocrinology

## 2015-01-03 ENCOUNTER — Telehealth: Payer: Self-pay | Admitting: Endocrinology

## 2015-01-03 MED ORDER — BROMOCRIPTINE MESYLATE 2.5 MG PO TABS: 1.2500 mg | ORAL_TABLET | Freq: Every day | ORAL | Status: DC

## 2015-01-03 NOTE — Telephone Encounter (Signed)
Onglyza is still to expensive even with the discount card. Is there an alternate that could be used.

## 2015-01-03 NOTE — Telephone Encounter (Signed)
Ok, Please change to "bromocriptine," to help your blood sugar. It has possible side effects of nausea and dizziness.  These go away with time.  You can avoid these by taking it at bedtime.  i have sent a prescription to [facility]

## 2015-01-03 NOTE — Telephone Encounter (Signed)
See note below and please advise, Thanks! 

## 2015-01-04 NOTE — Telephone Encounter (Signed)
Pt advised of note below and voiced understanding.  

## 2015-01-09 ENCOUNTER — Telehealth: Payer: Self-pay | Admitting: Family Medicine

## 2015-01-09 ENCOUNTER — Other Ambulatory Visit: Payer: Self-pay

## 2015-01-09 ENCOUNTER — Telehealth: Payer: Self-pay | Admitting: Endocrinology

## 2015-01-09 MED ORDER — GLIPIZIDE ER 2.5 MG PO TB24: 2.5000 mg | ORAL_TABLET | Freq: Every day | ORAL | Status: DC

## 2015-01-09 MED ORDER — METFORMIN HCL ER 500 MG PO TB24: ORAL_TABLET | ORAL | Status: DC

## 2015-01-09 MED ORDER — PIOGLITAZONE HCL 45 MG PO TABS: ORAL_TABLET | ORAL | Status: DC

## 2015-01-09 MED ORDER — BROMOCRIPTINE MESYLATE 2.5 MG PO TABS: 1.2500 mg | ORAL_TABLET | Freq: Every day | ORAL | Status: DC

## 2015-01-09 NOTE — Telephone Encounter (Signed)
Rx's sent per pt's request.

## 2015-01-09 NOTE — Telephone Encounter (Signed)
Patient ask if prescriptions can be changed from [facility] to [facility], please advise

## 2015-01-09 NOTE — Telephone Encounter (Signed)
Patient switched to [facility] and need the following medication sent to [facility]: benazepril (LOTENSIN) 20 MG tablet. He is going to call Dr. Cordelia Pen office and have them send the remaining to [facility].

## 2015-01-10 NOTE — Telephone Encounter (Signed)
Pt would like to add  rosuvastatin (CRESTOR) 40 MG tablet [facility]

## 2015-01-11 MED ORDER — ROSUVASTATIN CALCIUM 40 MG PO TABS: 40.0000 mg | ORAL_TABLET | Freq: Every day | ORAL | Status: DC

## 2015-01-11 NOTE — Telephone Encounter (Signed)
I sent script for Crestor e-scribe to [facility].

## 2015-02-19 ENCOUNTER — Encounter: Payer: Self-pay | Admitting: Endocrinology

## 2015-02-19 ENCOUNTER — Ambulatory Visit (INDEPENDENT_AMBULATORY_CARE_PROVIDER_SITE_OTHER): Payer: BLUE CROSS/BLUE SHIELD | Admitting: Endocrinology

## 2015-02-19 ENCOUNTER — Telehealth: Payer: Self-pay | Admitting: Endocrinology

## 2015-02-19 VITALS — BP 126/88 | HR 65 | Temp 97.7°F | Ht 67.0 in | Wt 206.0 lb

## 2015-02-19 DIAGNOSIS — E119 Type 2 diabetes mellitus without complications: Secondary | ICD-10-CM | POA: Diagnosis not present

## 2015-02-19 LAB — BASIC METABOLIC PANEL
BUN: 19 mg/dL (ref 6–23)
CALCIUM: 9.8 mg/dL (ref 8.4–10.5)
CHLORIDE: 102 meq/L (ref 96–112)
CO2: 26 mEq/L (ref 19–32)
Creatinine, Ser: 1.16 mg/dL (ref 0.40–1.50)
GFR: 82.16 mL/min (ref >60.00)
Glucose, Bld: 123 mg/dL — ABNORMAL HIGH (ref 70–99)
Potassium: 4 mEq/L (ref 3.5–5.1)
Sodium: 136 mEq/L (ref 135–145)

## 2015-02-19 LAB — HEMOGLOBIN A1C: Hgb A1c MFr Bld: 7.1 % — ABNORMAL HIGH (ref 4.6–6.5)

## 2015-02-19 NOTE — Telephone Encounter (Signed)
please call patient: Blood sugar is good. Please continue the same medications. i'll see you next time.

## 2015-02-19 NOTE — Patient Instructions (Addendum)
check your blood sugar once a Bia.  vary the time of Alber[place] when you check, between before the 3 meals, and at bedtime.  also check if you have symptoms of your blood sugar being too high or too low.  please keep a record of the readings and bring it to your next appointment here.  You can write it on any piece of paper.  please call us sooner if your blood sugar goes below 70, or if you have a lot of readings over 200.    Please come back for a follow-up appointment in 2 months.   blood tests are requested for you today.  We'll let you know about the results.   If necessary, we can add "tradjenta."

## 2015-02-19 NOTE — Progress Notes (Signed)
Subjective:    Patient ID: Jerome Vaughn, male    DOB: Apr 06, 1951, 64 y.o.   MRN: [id number]  HPI Pt returns for f/u of diabetes mellitus: DM type: 2 Dx'ed: 9470 Complications: none Therapy: 5 oral meds.  DKA: never Severe hypoglycemia: never Pancreatitis: never Other: he has never been on insulin; he could not afford onglyza. Interval history: no cbg record, but states cbg's are well-controlled.  pt states he feels well in general.  He takes meds as rx'ed.  Past Medical History  Diagnosis Date  . Diabetes mellitus   . Hyperlipidemia   . Hypertension     Past Surgical History  Procedure Laterality Date  . Wrist ganglion excision      History   Social History  . Marital Status: Single    Spouse Name: N/A  . Number of Children: N/A  . Years of Education: N/A   Occupational History  . Not on file.   Social History Main Topics  . Smoking status: Former Smoker    Types: Cigarettes    Quit date: 10/07/2013  . Smokeless tobacco: Never Used     Comment: quit cigarettes, might have a cigar 1 X month  . Alcohol Use: 1.8 oz/week    3 Standard drinks or equivalent per week  . Drug Use: No  . Sexual Activity: Not on file   Other Topics Concern  . Not on file   Social History Narrative    Current Outpatient Prescriptions on File Prior to Visit  Medication Sig Dispense Refill  . aspirin 81 MG tablet Take 81 mg by mouth daily.     . benazepril (LOTENSIN) 20 MG tablet Take 1 tablet by mouth  daily 90 tablet 3  . Blood Glucose Monitoring Suppl (ACCU-CHEK AVIVA PLUS) W/DEVICE KIT Test once per [place] 1 kit 0  . bromocriptine (PARLODEL) 2.5 MG tablet Take 0.5 tablets (1.25 mg total) by mouth at bedtime. 45 tablet 3  . canagliflozin (INVOKANA) 300 MG TABS tablet Take 300 mg by mouth daily before breakfast. 30 tablet 11  . CINNAMON PO Take 2,000 mg by mouth.     Marland Kitchen glipiZIDE (GLUCOTROL XL) 2.5 MG 24 hr tablet Take 1 tablet (2.5 mg total) by mouth daily with breakfast. 90 tablet 3   . glucose blood (ACCU-CHEK AVIVA PLUS) test strip Diagnosis code is E 11.9 100 each 0  . Lancets (ACCU-CHEK SOFT TOUCH) lancets Dispense for Aviva plus and diagnosis code is E 11.9 100 each 0  . metFORMIN (GLUCOPHAGE-XR) 500 MG 24 hr tablet Take 4 tablets by mouth  daily with breakfast 360 tablet 3  . Multiple Vitamin (MULTIVITAMIN) tablet Take 1 tablet by mouth daily.      . Omega-3 Fatty Acids (FISH OIL) 1000 MG CAPS Take by mouth.    . pioglitazone (ACTOS) 45 MG tablet Take 1 tablet by mouth  daily 90 tablet 3  . rosuvastatin (CRESTOR) 40 MG tablet Take 1 tablet (40 mg total) by mouth daily. TAKE AS DIRECTED FOR CHOLESTROL 90 tablet 1   No current facility-administered medications on file prior to visit.    No Known Allergies  Family History  Problem Relation Age of Onset  . Hyperlipidemia Sister   . Diabetes Sister   . Hypertension Sister   . Anuerysm Brother   . Diabetes Maternal Grandmother   . Hyperlipidemia Sister   . Kidney disease Sister     BP 126/88 mmHg  Pulse 65  Temp(Src) 97.7 F (36.5 C) (Oral)  Ht '5\' 7"'  (1.702 m)  Wt 206 lb (93.441 kg)  BMI 32.26 kg/m2  SpO2 97%    Review of Systems He denies hypoglycemia    Objective:   Physical Exam VITAL SIGNS:  See vs page GENERAL: no distress Ext: no edema.   Lab Results  Component Value Date   HGBA1C 7.1* 02/19/2015       Assessment & Plan:  DM: this is the best control this pt should aim for, given this sulfonylurea-containing regimen.  Patient is advised the following: Patient Instructions  check your blood sugar once a Simonich.  vary the time of [place] when you check, between before the 3 meals, and at bedtime.  also check if you have symptoms of your blood sugar being too high or too low.  please keep a record of the readings and bring it to your next appointment here.  You can write it on any piece of paper.  please call us sooner if your blood sugar goes below 70, or if you have a lot of readings over  200.    Please come back for a follow-up appointment in 2 months.   blood tests are requested for you today.  We'll let you know about the results.   If necessary, we can add "tradjenta."   addendum: Please continue the same diabetes medications

## 2015-02-20 NOTE — Telephone Encounter (Signed)
Patient advise of note below and voiced understanding.

## 2015-02-23 ENCOUNTER — Telehealth: Payer: Self-pay | Admitting: Family Medicine

## 2015-02-23 MED ORDER — BENAZEPRIL HCL 20 MG PO TABS: ORAL_TABLET | ORAL | Status: DC

## 2015-02-23 NOTE — Telephone Encounter (Signed)
I sent script e-scribe. 

## 2015-02-23 NOTE — Telephone Encounter (Signed)
Pt request refill of the following: benazepril (LOTENSIN) 20 MG tablet   Phamacy: [facility]

## 2015-03-28 ENCOUNTER — Other Ambulatory Visit: Payer: Self-pay

## 2015-03-28 MED ORDER — CANAGLIFLOZIN 300 MG PO TABS: 300.0000 mg | ORAL_TABLET | Freq: Every day | ORAL | Status: DC

## 2015-05-07 ENCOUNTER — Ambulatory Visit: Payer: BLUE CROSS/BLUE SHIELD | Admitting: Endocrinology

## 2015-08-15 ENCOUNTER — Telehealth: Payer: Self-pay | Admitting: Family Medicine

## 2015-08-15 NOTE — Telephone Encounter (Signed)
Pt call to ask for a rx to get a pneumonia shot at [facility]

## 2015-08-16 NOTE — Telephone Encounter (Signed)
rx is ready to pick up  

## 2015-08-17 NOTE — Telephone Encounter (Signed)
Attempted to call and make pt aware rx is ready for pick up.

## 2015-08-25 ENCOUNTER — Other Ambulatory Visit: Payer: Self-pay | Admitting: Family Medicine

## 2015-08-27 ENCOUNTER — Other Ambulatory Visit: Payer: Self-pay | Admitting: Endocrinology

## 2015-09-10 ENCOUNTER — Telehealth: Payer: Self-pay | Admitting: Family Medicine

## 2015-09-10 NOTE — Telephone Encounter (Signed)
Pt is [facility] using [facility]. Pt needs 90 [place] supply w/refills of benazepril 20 mg, rosuvastatin, metformin,glipizide,bromocriptine, pioglitazone send to [facility] [address]

## 2015-09-13 MED ORDER — BENAZEPRIL HCL 20 MG PO TABS: ORAL_TABLET | ORAL | Status: DC

## 2015-09-13 MED ORDER — ROSUVASTATIN CALCIUM 40 MG PO TABS: 40.0000 mg | ORAL_TABLET | Freq: Every day | ORAL | Status: DC

## 2015-09-13 NOTE — Telephone Encounter (Signed)
I sent script for Benazepril & Rosuvastatin to local pharmacy. I spoke with pt and he will need to schedule a office visit here, he is due for labs, also advised pt to contact Dr. Cordelia Pen office for refills on other 4 scripts.

## 2015-09-28 ENCOUNTER — Telehealth: Payer: Self-pay | Admitting: Endocrinology

## 2015-09-28 NOTE — Telephone Encounter (Signed)
error 

## 2015-09-30 HISTORY — PX: CORONARY ARTERY BYPASS GRAFT: SHX141

## 2015-10-03 ENCOUNTER — Ambulatory Visit: Payer: BLUE CROSS/BLUE SHIELD | Admitting: Endocrinology

## 2015-10-20 ENCOUNTER — Other Ambulatory Visit: Payer: Self-pay | Admitting: Family Medicine

## 2015-11-07 ENCOUNTER — Ambulatory Visit (INDEPENDENT_AMBULATORY_CARE_PROVIDER_SITE_OTHER): Payer: BLUE CROSS/BLUE SHIELD | Admitting: Endocrinology

## 2015-11-07 ENCOUNTER — Encounter: Payer: Self-pay | Admitting: Endocrinology

## 2015-11-07 VITALS — BP 118/78 | HR 88 | Temp 98.1°F | Ht 67.0 in | Wt 194.0 lb

## 2015-11-07 DIAGNOSIS — E119 Type 2 diabetes mellitus without complications: Secondary | ICD-10-CM

## 2015-11-07 DIAGNOSIS — R9431 Abnormal electrocardiogram [ECG] [EKG]: Secondary | ICD-10-CM

## 2015-11-07 LAB — POCT GLYCOSYLATED HEMOGLOBIN (HGB A1C): Hemoglobin A1C: 7.3

## 2015-11-07 MED ORDER — PIOGLITAZONE HCL 45 MG PO TABS: ORAL_TABLET | ORAL | Status: DC

## 2015-11-07 NOTE — Patient Instructions (Addendum)
check your blood sugar once a D[place]ffer.  vary the time of [place] when you check, between before the 3 meals, and at bedtime.  also check if you have symptoms of your blood sugar being too high or too low.  please keep a record of the readings and bring it to your next appointment here.  You can write it on any piece of paper.  please call us sooner if your blood sugar goes below 70, or if you have a lot of readings over 200.    Please come back for a follow-up appointment in 2 months.   Please have a "treadmill" test.  you will receive a phone call, about a Bright and time for an appointment.  Also, please go back to see Dr Sarajane Jews, as I need to drop down, and take care of just the diabetes.

## 2015-11-07 NOTE — Progress Notes (Signed)
Subjective:    Patient ID: Jerome Vaughn, male    DOB: 11/07/1950, 65 y.o.   MRN: [id number]  HPI Pt returns for f/u of diabetes mellitus: DM type: 2 Dx'ed: 3254 Complications: none Therapy: 5 oral meds.  DKA: never Severe hypoglycemia: never Pancreatitis: never Other: he has never been on insulin; he could not afford onglyza. Interval history: He does not take pioglitizone. Pt states few mos of intermittent (L>R) arm numbness, and slight assoc pain.  sxs are nonexertional, and worse at night.   Past Medical History  Diagnosis Date  . Diabetes mellitus   . Hyperlipidemia   . Hypertension     Past Surgical History  Procedure Laterality Date  . Wrist ganglion excision      Social History   Social History  . Marital Status: Single    Spouse Name: N/A  . Number of Children: N/A  . Years of Education: N/A   Occupational History  . Not on file.   Social History Main Topics  . Smoking status: Former Smoker    Types: Cigarettes    Quit date: 10/07/2013  . Smokeless tobacco: Never Used     Comment: quit cigarettes, might have a cigar 1 X month  . Alcohol Use: 1.8 oz/week    3 Standard drinks or equivalent per week  . Drug Use: No  . Sexual Activity: Not on file   Other Topics Concern  . Not on file   Social History Narrative    Current Outpatient Prescriptions on File Prior to Visit  Medication Sig Dispense Refill  . aspirin 81 MG tablet Take 81 mg by mouth daily.     . benazepril (LOTENSIN) 20 MG tablet Take 1 tablet by mouth  daily 30 tablet 1  . Blood Glucose Monitoring Suppl (ACCU-CHEK AVIVA PLUS) W/DEVICE KIT Test once per [place] 1 kit 0  . bromocriptine (PARLODEL) 2.5 MG tablet Take 0.5 tablets (1.25 mg total) by mouth at bedtime. 45 tablet 3  . canagliflozin (INVOKANA) 300 MG TABS tablet Take 300 mg by mouth daily before breakfast. 30 tablet 3  . CINNAMON PO Take 2,000 mg by mouth.     Marland Kitchen glipiZIDE (GLUCOTROL XL) 2.5 MG 24 hr tablet Take 1 tablet (2.5 mg  total) by mouth daily with breakfast. 90 tablet 3  . glucose blood (ACCU-CHEK AVIVA PLUS) test strip Diagnosis code is E 11.9 100 each 0  . INVOKANA 300 MG TABS tablet TAKE 300 MG BY MOUTH DAILY BEFORE BREAKFAST. 30 tablet 11  . Lancets (ACCU-CHEK SOFT TOUCH) lancets Dispense for Aviva plus and diagnosis code is E 11.9 100 each 0  . metFORMIN (GLUCOPHAGE-XR) 500 MG 24 hr tablet Take 4 tablets by mouth  daily with breakfast 360 tablet 3  . Multiple Vitamin (MULTIVITAMIN) tablet Take 1 tablet by mouth daily.      . Omega-3 Fatty Acids (FISH OIL) 1000 MG CAPS Take by mouth.    . rosuvastatin (CRESTOR) 40 MG tablet TAKE 1 TABLET DAILY AS DIRECTED FOR CHOLESTEROL 90 tablet 0   No current facility-administered medications on file prior to visit.    No Known Allergies  Family History  Problem Relation Age of Onset  . Hyperlipidemia Sister   . Diabetes Sister   . Hypertension Sister   . Anuerysm Brother   . Diabetes Maternal Grandmother   . Hyperlipidemia Sister   . Kidney disease Sister     BP 118/78 mmHg  Pulse 88  Temp(Src) 98.1 F (36.7 C) (  Oral)  Ht '5\' 7"'  (1.702 m)  Wt 194 lb (87.998 kg)  BMI 30.38 kg/m2  SpO2 94%  Review of Systems Denies chest pain and sob     Objective:   Physical Exam VITAL SIGNS:  See vs page. GENERAL: no distress. Pulses: dorsalis pedis intact bilat.   MSK: no deformity of the feet CV: trace leg edema Skin:  no ulcer on the feet.  normal color and temp on the feet. Neuro: sensation is intact to touch on the hands and feet.  i personally reviewed electrocardiogram tracing (today): Indication: UE sxs Impression: benign changes only   A1c=7.3%    Assessment & Plan:  DM: this is the best control this pt should aim for, given this sulfonylurea-containing regimen. Pain and numbness, new, atypical for cardiogenic sxs.    Patient is advised the following: Patient Instructions  check your blood sugar once a Ch[place]oin.  vary the time of [place] when you  check, between before the 3 meals, and at bedtime.  also check if you have symptoms of your blood sugar being too high or too low.  please keep a record of the readings and bring it to your next appointment here.  You can write it on any piece of paper.  please call us sooner if your blood sugar goes below 70, or if you have a lot of readings over 200.    Please come back for a follow-up appointment in 2 months.   Please have a "treadmill" test.  you will receive a phone call, about a Vanegas and time for an appointment.  Also, please go back to see Dr Sarajane Jews, as I need to drop down, and take care of just the diabetes.

## 2015-11-09 ENCOUNTER — Telehealth: Payer: Self-pay | Admitting: Endocrinology

## 2015-11-09 MED ORDER — PIOGLITAZONE HCL 45 MG PO TABS: ORAL_TABLET | ORAL | Status: DC

## 2015-11-09 NOTE — Telephone Encounter (Signed)
please call patient: Ins has declined pioglitizone, but it is cheap to buy without insurance. i can send it to a cheaper pharmacy, such as [facility].

## 2015-11-09 NOTE — Telephone Encounter (Signed)
Rx submitted to [facility] per pt's request.

## 2015-11-14 ENCOUNTER — Encounter: Payer: Self-pay | Admitting: *Deleted

## 2015-11-14 ENCOUNTER — Encounter: Payer: Self-pay | Admitting: Physician Assistant

## 2015-11-14 ENCOUNTER — Ambulatory Visit (INDEPENDENT_AMBULATORY_CARE_PROVIDER_SITE_OTHER): Payer: BLUE CROSS/BLUE SHIELD

## 2015-11-14 ENCOUNTER — Ambulatory Visit (INDEPENDENT_AMBULATORY_CARE_PROVIDER_SITE_OTHER): Payer: BLUE CROSS/BLUE SHIELD | Admitting: Physician Assistant

## 2015-11-14 VITALS — BP 110/80 | HR 86 | Ht 67.5 in | Wt 193.4 lb

## 2015-11-14 DIAGNOSIS — M79602 Pain in left arm: Secondary | ICD-10-CM

## 2015-11-14 DIAGNOSIS — E785 Hyperlipidemia, unspecified: Secondary | ICD-10-CM

## 2015-11-14 DIAGNOSIS — E119 Type 2 diabetes mellitus without complications: Secondary | ICD-10-CM

## 2015-11-14 DIAGNOSIS — R9439 Abnormal result of other cardiovascular function study: Secondary | ICD-10-CM | POA: Diagnosis not present

## 2015-11-14 DIAGNOSIS — R9431 Abnormal electrocardiogram [ECG] [EKG]: Secondary | ICD-10-CM

## 2015-11-14 DIAGNOSIS — I10 Essential (primary) hypertension: Secondary | ICD-10-CM

## 2015-11-14 DIAGNOSIS — I208 Other forms of angina pectoris: Secondary | ICD-10-CM

## 2015-11-14 LAB — EXERCISE TOLERANCE TEST
Estimated workload: 7.5 METS
Exercise duration (min): 6 min
Exercise duration (sec): 23 s
MPHR: 158 {beats}/min
Peak HR: 137 {beats}/min
Percent HR: 87 %
Percent of predicted max HR: 86 %
RPE: 15
Rest HR: 78 {beats}/min
Stage 1 DBP: 86 mmHg
Stage 1 Grade: 0 %
Stage 1 HR: 82 {beats}/min
Stage 1 SBP: 128 mmHg
Stage 1 Speed: 0 mph
Stage 2 Grade: 0 %
Stage 2 HR: 86 {beats}/min
Stage 2 Speed: 1 mph
Stage 3 Grade: 0.1 %
Stage 3 HR: 86 {beats}/min
Stage 3 Speed: 1 mph
Stage 4 DBP: 79 mmHg
Stage 4 Grade: 10 %
Stage 4 HR: 116 {beats}/min
Stage 4 SBP: 161 mmHg
Stage 4 Speed: 1.7 mph
Stage 5 DBP: 86 mmHg
Stage 5 Grade: 12 %
Stage 5 HR: 134 {beats}/min
Stage 5 SBP: 168 mmHg
Stage 5 Speed: 2.5 mph
Stage 6 Grade: 14 %
Stage 6 HR: 137 {beats}/min
Stage 6 Speed: 3.4 mph
Stage 7 DBP: 86 mmHg
Stage 7 Grade: 0 %
Stage 7 HR: 127 {beats}/min
Stage 7 SBP: 171 mmHg
Stage 7 Speed: 0 mph
Stage 8 Grade: 0 %
Stage 8 HR: 88 {beats}/min
Stage 8 Speed: 0 mph

## 2015-11-14 LAB — CBC WITH DIFFERENTIAL/PLATELET
BASOS ABS: 0 10*3/uL (ref 0.0–0.1)
BASOS PCT: 0 % (ref 0–1)
Eosinophils Absolute: 0.1 10*3/uL (ref 0.0–0.7)
Eosinophils Relative: 2 % (ref 0–5)
HCT: 41.1 % (ref 39.0–52.0)
HEMOGLOBIN: 14.1 g/dL (ref 13.0–17.0)
Lymphocytes Relative: 33 % (ref 12–46)
Lymphs Abs: 1.7 10*3/uL (ref 0.7–4.0)
MCH: 30.1 pg (ref 26.0–34.0)
MCHC: 34.3 g/dL (ref 30.0–36.0)
MCV: 87.8 fL (ref 78.0–100.0)
MONOS PCT: 9 % (ref 3–12)
MPV: 10.4 fL (ref 8.6–12.4)
Monocytes Absolute: 0.5 10*3/uL (ref 0.1–1.0)
NEUTROS ABS: 2.8 10*3/uL (ref 1.7–7.7)
NEUTROS PCT: 56 % (ref 43–77)
PLATELETS: 177 10*3/uL (ref 150–400)
RBC: 4.68 MIL/uL (ref 4.22–5.81)
RDW: 13.7 % (ref 11.5–15.5)
WBC: 5 10*3/uL (ref 4.0–10.5)

## 2015-11-14 MED ORDER — NITROGLYCERIN 0.4 MG SL SUBL: 0.4000 mg | SUBLINGUAL_TABLET | SUBLINGUAL | Status: DC | PRN

## 2015-11-14 NOTE — Patient Instructions (Addendum)
Medication Instructions:  1. AN RX FOR NITROGLYCERIN HAS BEEN SENT IN AND YOU HAVE BEEN ADVISED AS TO HOW AND WHEN TO USE NTG  Labwork: 1. TODAY BMET, CBC W/DIFF, PT/INR  Testing/Procedures: Your physician has requested that you have a cardiac catheterization. Cardiac catheterization is used to diagnose and/or treat various heart conditions. Doctors may recommend this procedure for a number of different reasons. The most common reason is to evaluate chest pain. Chest pain can be a symptom of coronary artery disease (CAD), and cardiac catheterization can show whether plaque is narrowing or blocking your heart's arteries. This procedure is also used to evaluate the valves, as well as measure the blood flow and oxygen levels in different parts of your heart. For further information please visit [email or web address]. Please follow instruction sheet, as given.  Follow-Up: 12/06/15 @ 11:30 WITH SCOTT WEAVER, PAC  Any Other Special Instructions Will Be Listed Below (If Applicable).  If you need a refill on your cardiac medications before your next appointment, please call your pharmacy.

## 2015-11-14 NOTE — Progress Notes (Signed)
Cardiology Office Note:    Date:  11/14/2015   ID:  Jerome Vaughn, DOB 06/01/1951, MRN [id number]  PCP:  FRY,STEPHEN A, MD  Endocrinologist:  Dr. Ellison Cardiologist:  New - seeing Dr. Thomas Brackbill today  Electrophysiologist:  N/a  Referring MD:  Dr. Ellison   Chief Complaint  Patient presents with  . Chest Pain    Consult  . Abnormal Stress Test    History of Present Illness:     Jerome Vaughn is a 65 y.o. male former smoker with a hx of DM, HTN, HL.  He has had a hx of L arm numbness and tingling for several mos. This is not related to exertion and seems worse when he lies prone.  He has noted exertional substernal chest tightness/burning over the past few mos.  It comes on with walking in the evening.  He denies assoc symptoms.  He denies orthopnea, PND, edema, syncope.  He saw Dr. Ellison recently and was referred for an ETT.  His ETT was markedly abnormal and he was added on to my schedule.  He notes reproduction of his chest symptoms on the treadmill today.     Past Medical History  Diagnosis Date  . Diabetes mellitus   . Hyperlipidemia   . Hypertension   . Carpal tunnel syndrome     both hands    Past Surgical History  Procedure Laterality Date  . Wrist ganglion excision    . Frozen shoulder release    . Colonsocopy  2012    Current Medications: Outpatient Prescriptions Prior to Visit  Medication Sig Dispense Refill  . aspirin 81 MG tablet Take 81 mg by mouth daily.     . benazepril (LOTENSIN) 20 MG tablet Take 1 tablet by mouth  daily 30 tablet 1  . Blood Glucose Monitoring Suppl (ACCU-CHEK AVIVA PLUS) W/DEVICE KIT Test once per [place] 1 kit 0  . bromocriptine (PARLODEL) 2.5 MG tablet Take 0.5 tablets (1.25 mg total) by mouth at bedtime. 45 tablet 3  . canagliflozin (INVOKANA) 300 MG TABS tablet Take 300 mg by mouth daily before breakfast. 30 tablet 3  . CINNAMON PO Take 2,000 mg by mouth daily.     . glipiZIDE (GLUCOTROL XL) 2.5 MG 24 hr tablet Take 1 tablet  (2.5 mg total) by mouth daily with breakfast. 90 tablet 3  . glucose blood (ACCU-CHEK AVIVA PLUS) test strip Diagnosis code is E 11.9 100 each 0  . Lancets (ACCU-CHEK SOFT TOUCH) lancets Dispense for Aviva plus and diagnosis code is E 11.9 100 each 0  . metFORMIN (GLUCOPHAGE-XR) 500 MG 24 hr tablet Take 4 tablets by mouth  daily with breakfast 360 tablet 3  . Multiple Vitamin (MULTIVITAMIN) tablet Take 1 tablet by mouth daily.      . Omega-3 Fatty Acids (FISH OIL) 1000 MG CAPS Take 1,000 mg by mouth daily.     . pioglitazone (ACTOS) 45 MG tablet Take 1 tablet by mouth  daily 90 tablet 3  . INVOKANA 300 MG TABS tablet TAKE 300 MG BY MOUTH DAILY BEFORE BREAKFAST. (Patient not taking: Reported on 11/14/2015) 30 tablet 11  . rosuvastatin (CRESTOR) 40 MG tablet TAKE 1 TABLET DAILY AS DIRECTED FOR CHOLESTEROL (Patient not taking: Reported on 11/14/2015) 90 tablet 0   No facility-administered medications prior to visit.     Allergies:   Review of patient's allergies indicates no known allergies.   Social History   Social History  . Marital Status: Single      Spouse Name: N/A  . Number of Children: 5  . Years of Education: N/A   Occupational History  . Dispatcher    Social History Main Topics  . Smoking status: Former Smoker -- 0.25 packs/Kuc for 20 years    Types: Cigarettes    Quit date: 10/07/2013  . Smokeless tobacco: Never Used     Comment: quit cigarettes, might have a cigar 1 X month  . Alcohol Use: 1.8 oz/week    3 Standard drinks or equivalent per week  . Drug Use: No  . Sexual Activity: Not Asked   Other Topics Concern  . 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 There is no history of Heart attack.   ROS:   Please see the history of present illness.    Review of Systems  Constitution: Negative for fever.  Respiratory: Negative for cough.   Hematologic/Lymphatic: Negative for bleeding problem.  Gastrointestinal: Negative for melena and vomiting.  All other systems reviewed and are negative.   Physical Exam:    VS:  BP 110/80 mmHg  Pulse 86  Ht 5' 7.5" (1.715 m)  Wt 193 lb 6.4 oz (87.726 kg)  BMI 29.83 kg/m2   GEN: Well nourished, well developed, in no acute distress HEENT: normal Neck: no JVD, no masses Cardiac: Normal S1/S2, RRR; no murmurs, rubs, or gallops, trace bilateral ankle edema;  no carotid bruits,  No FA bruits bilaterally Respiratory:  clear to auscultation bilaterally; no wheezing, rhonchi or rales GI: soft, nontender, nondistended  MS: no deformity or atrophy Skin: warm and dry  Neuro:   no focal deficits  Psych: Alert and oriented x 3, normal affect  Wt Readings from Last 3 Encounters:  11/14/15 193 lb 6.4 oz (87.726 kg)  11/07/15 194 lb (87.998 kg)  02/19/15 206 lb (93.441 kg)      Studies/Labs Reviewed:     EKG:  EKG is not  ordered today.  The ekg ordered today demonstrates n/a   Recent Labs: 02/19/2015: BUN 19; Creatinine, Ser 1.16; Potassium 4.0; Sodium 136   Recent Lipid Panel    Component Value Date/Time   CHOL 169 08/22/2014 1034   TRIG 146.0 08/22/2014 1034   HDL 51.80 08/22/2014 1034   CHOLHDL 3 08/22/2014 1034   VLDL 29.2 08/22/2014 1034   LDLCALC 88 08/22/2014 1034   LDLDIRECT 204.8 09/05/2013 1559    Additional studies/ records that were reviewed today include:   ETT 11/14/15 2 mm or greater ST depression in inferolateral leads at peak exercise  Echo 6/10 Mild LVH, EF 55%   ASSESSMENT:     1. Exertional angina (HCC)   2. Abnormal stress test   3. Essential hypertension   4. Controlled type 2 diabetes mellitus without  complication, without long-term current use of insulin (HCC)   5. Hyperlipidemia   6. Left arm pain     PLAN:     In order of problems listed above:  1. Exertional angina - Patient presented to the office today for routine exercise treadmill test. He was referred by his endocrinologist. As noted, ETT is markedly abnormal with   findings suggestive of ischemia. He had reproduction of chest pain on the treadmill. Recommendation is to proceed with cardiac catheterization for definitive evaluation. I discussed this with Dr. Brackbill (DOD), who agreed. He also saw the patient today.  Risks and benefits of cardiac catheterization have been discussed with the patient.  These include bleeding, infection, kidney damage, stroke, heart attack, death.  The patient understands these risks and is willing to proceed.   -  LHC in the next week  -  Continue ASA, statin  -  Rx for prn NTG  -  FU 2 weeks post cath  2. Abnormal ETT - As outlined above.  3. HTN - Controlled.  4. DM2 - Hold Metformin 24 hours pre and 48 hours post cath.  Hold Invokana, Glipizide AM of cath.  5. HL - Continue statin.  6. L arm pain - Symptoms are atypical for ischemia. If left arm symptoms continue after cardiac catheterization, consider further evaluation with primary care.    Medication Adjustments/Labs and Tests Ordered: Current medicines are reviewed at length with the patient today.  Concerns regarding medicines are outlined above.  Medication changes, Labs and Tests ordered today are outlined in the Patient Instructions noted below. Patient Instructions  Medication Instructions:  1. AN RX FOR NITROGLYCERIN HAS BEEN SENT IN AND YOU HAVE BEEN ADVISED AS TO HOW AND WHEN TO USE NTG  Labwork: 1. TODAY BMET, CBC W/DIFF, PT/INR  Testing/Procedures: Your physician has requested that you have a cardiac catheterization. Cardiac catheterization is used to diagnose and/or treat various heart conditions. Doctors may recommend  this procedure for a number of different reasons. The most common reason is to evaluate chest pain. Chest pain can be a symptom of coronary artery disease (CAD), and cardiac catheterization can show whether plaque is narrowing or blocking your heart's arteries. This procedure is also used to evaluate the valves, as well as measure the blood flow and oxygen levels in different parts of your heart. For further information please visit www.cardiosmart.org. Please follow instruction sheet, as given.  Follow-Up: 12/06/15 @ 11:30 WITH Tauheed Mcfayden, PAC  Any Other Special Instructions Will Be Listed Below (If Applicable).  If you need a refill on your cardiac medications before your next appointment, please call your pharmacy.   Signed, Montie Swiderski, PA-C  11/14/2015 11:28 AM    [facility] [address], [place], [place]  [address] Phone: [phone]; Fax: [phone]     

## 2015-11-15 ENCOUNTER — Telehealth: Payer: Self-pay | Admitting: Endocrinology

## 2015-11-15 LAB — BASIC METABOLIC PANEL
BUN: 20 mg/dL (ref 7–25)
CALCIUM: 10.2 mg/dL (ref 8.6–10.3)
CHLORIDE: 105 mmol/L (ref 98–110)
CO2: 22 mmol/L (ref 20–31)
Creat: 1.19 mg/dL (ref 0.70–1.25)
Glucose, Bld: 144 mg/dL — ABNORMAL HIGH (ref 65–99)
Potassium: 4.4 mmol/L (ref 3.5–5.3)
SODIUM: 138 mmol/L (ref 135–146)

## 2015-11-15 LAB — PROTIME-INR
INR: 1.02 (ref <1.50)
PROTHROMBIN TIME: 13.5 s (ref 11.6–15.2)

## 2015-11-15 NOTE — Telephone Encounter (Signed)
i'll do PA form 

## 2015-11-15 NOTE — Telephone Encounter (Signed)
Jerome Vaughn from [facility] stated that before patient can be considered to take medication Piolitazone he first has to try an alternative medication, Metformin ACL tab or Metformin ER Tabs, or Januvia.  please advise  phone # [phone]  Ref# [id number]

## 2015-11-15 NOTE — Telephone Encounter (Signed)
Or the Trajenta

## 2015-11-15 NOTE — Telephone Encounter (Signed)
See note below and please advise, Thanks! 

## 2015-11-16 NOTE — Telephone Encounter (Signed)
Pa placed on your desk.  

## 2015-11-16 NOTE — Telephone Encounter (Signed)
done

## 2015-11-19 ENCOUNTER — Telehealth: Payer: Self-pay | Admitting: *Deleted

## 2015-11-19 NOTE — Telephone Encounter (Signed)
Pt has been notified of lab results by phone with verbal understanding. 

## 2015-11-19 NOTE — Telephone Encounter (Signed)
PA faxed waiting on response.

## 2015-11-21 ENCOUNTER — Encounter (HOSPITAL_COMMUNITY)
Admission: AD | Disposition: A | Discharge status: Home / Self Care | Payer: Self-pay | Source: Ambulatory Visit | Attending: Cardiothoracic Surgery

## 2015-11-21 ENCOUNTER — Inpatient Hospital Stay (HOSPITAL_COMMUNITY): Payer: BLUE CROSS/BLUE SHIELD

## 2015-11-21 ENCOUNTER — Encounter (HOSPITAL_COMMUNITY): Payer: Self-pay | Admitting: Certified Registered Nurse Anesthetist

## 2015-11-21 ENCOUNTER — Inpatient Hospital Stay (HOSPITAL_COMMUNITY)
Admission: AD | Admit: 2015-11-21 | Discharge: 2015-11-27 | DRG: 234 | Disposition: A | Discharge status: Home / Self Care | Payer: BLUE CROSS/BLUE SHIELD | Source: Ambulatory Visit | Attending: Cardiothoracic Surgery | Admitting: Cardiothoracic Surgery

## 2015-11-21 ENCOUNTER — Other Ambulatory Visit: Payer: Self-pay | Admitting: *Deleted

## 2015-11-21 DIAGNOSIS — I25110 Atherosclerotic heart disease of native coronary artery with unstable angina pectoris: Principal | ICD-10-CM | POA: Diagnosis present

## 2015-11-21 DIAGNOSIS — K913 Postprocedural intestinal obstruction: Secondary | ICD-10-CM | POA: Diagnosis not present

## 2015-11-21 DIAGNOSIS — Z8249 Family history of ischemic heart disease and other diseases of the circulatory system: Secondary | ICD-10-CM | POA: Diagnosis not present

## 2015-11-21 DIAGNOSIS — I208 Other forms of angina pectoris: Secondary | ICD-10-CM

## 2015-11-21 DIAGNOSIS — J449 Chronic obstructive pulmonary disease, unspecified: Secondary | ICD-10-CM | POA: Diagnosis present

## 2015-11-21 DIAGNOSIS — K567 Ileus, unspecified: Secondary | ICD-10-CM

## 2015-11-21 DIAGNOSIS — I2510 Atherosclerotic heart disease of native coronary artery without angina pectoris: Secondary | ICD-10-CM | POA: Diagnosis not present

## 2015-11-21 DIAGNOSIS — R079 Chest pain, unspecified: Secondary | ICD-10-CM

## 2015-11-21 DIAGNOSIS — Z79899 Other long term (current) drug therapy: Secondary | ICD-10-CM

## 2015-11-21 DIAGNOSIS — Z7982 Long term (current) use of aspirin: Secondary | ICD-10-CM

## 2015-11-21 DIAGNOSIS — Z951 Presence of aortocoronary bypass graft: Secondary | ICD-10-CM

## 2015-11-21 DIAGNOSIS — I10 Essential (primary) hypertension: Secondary | ICD-10-CM | POA: Diagnosis present

## 2015-11-21 DIAGNOSIS — I2584 Coronary atherosclerosis due to calcified coronary lesion: Secondary | ICD-10-CM | POA: Diagnosis present

## 2015-11-21 DIAGNOSIS — E1165 Type 2 diabetes mellitus with hyperglycemia: Secondary | ICD-10-CM | POA: Diagnosis present

## 2015-11-21 DIAGNOSIS — Z7984 Long term (current) use of oral hypoglycemic drugs: Secondary | ICD-10-CM

## 2015-11-21 DIAGNOSIS — I25119 Atherosclerotic heart disease of native coronary artery with unspecified angina pectoris: Secondary | ICD-10-CM

## 2015-11-21 DIAGNOSIS — I2582 Chronic total occlusion of coronary artery: Secondary | ICD-10-CM | POA: Diagnosis present

## 2015-11-21 DIAGNOSIS — E7800 Pure hypercholesterolemia, unspecified: Secondary | ICD-10-CM | POA: Diagnosis present

## 2015-11-21 DIAGNOSIS — J9811 Atelectasis: Secondary | ICD-10-CM

## 2015-11-21 DIAGNOSIS — E785 Hyperlipidemia, unspecified: Secondary | ICD-10-CM | POA: Diagnosis present

## 2015-11-21 DIAGNOSIS — Z01818 Encounter for other preprocedural examination: Secondary | ICD-10-CM

## 2015-11-21 DIAGNOSIS — Z833 Family history of diabetes mellitus: Secondary | ICD-10-CM

## 2015-11-21 DIAGNOSIS — I25709 Atherosclerosis of coronary artery bypass graft(s), unspecified, with unspecified angina pectoris: Secondary | ICD-10-CM | POA: Insufficient documentation

## 2015-11-21 DIAGNOSIS — F17200 Nicotine dependence, unspecified, uncomplicated: Secondary | ICD-10-CM | POA: Diagnosis present

## 2015-11-21 DIAGNOSIS — Z794 Long term (current) use of insulin: Secondary | ICD-10-CM

## 2015-11-21 DIAGNOSIS — Z87891 Personal history of nicotine dependence: Secondary | ICD-10-CM | POA: Diagnosis not present

## 2015-11-21 DIAGNOSIS — R9439 Abnormal result of other cardiovascular function study: Secondary | ICD-10-CM

## 2015-11-21 DIAGNOSIS — I25708 Atherosclerosis of coronary artery bypass graft(s), unspecified, with other forms of angina pectoris: Secondary | ICD-10-CM | POA: Insufficient documentation

## 2015-11-21 HISTORY — PX: CARDIAC CATHETERIZATION: SHX172

## 2015-11-21 HISTORY — DX: Atherosclerotic heart disease of native coronary artery without angina pectoris: I25.10

## 2015-11-21 LAB — SPIROMETRY WITH GRAPH
FEF 25-75 Post: 2.12 L/sec
FEF 25-75 Pre: 2.66 L/sec
FEF2575-%Change-Post: -20 %
FEF2575-%Pred-Post: 82 %
FEF2575-%Pred-Pre: 103 %
FEV1-%Change-Post: -6 %
FEV1-%Pred-Post: 80 %
FEV1-%Pred-Pre: 86 %
FEV1-Post: 2.24 L
FEV1-Pre: 2.4 L
FEV1FVC-%Change-Post: -11 %
FEV1FVC-%Pred-Pre: 103 %
FEV6-%Change-Post: 5 %
FEV6-%Pred-Post: 89 %
FEV6-%Pred-Pre: 84 %
FEV6-Post: 3.11 L
FEV6-Pre: 2.94 L
FEV6FVC-%Change-Post: 0 %
FEV6FVC-%Pred-Post: 104 %
FEV6FVC-%Pred-Pre: 103 %
FVC-%Change-Post: 4 %
FVC-%Pred-Post: 86 %
FVC-%Pred-Pre: 82 %
FVC-Post: 3.11 L
FVC-Pre: 2.96 L
Post FEV1/FVC ratio: 72 %
Post FEV6/FVC ratio: 100 %
Pre FEV1/FVC ratio: 81 %
Pre FEV6/FVC Ratio: 99 %

## 2015-11-21 LAB — ABO/RH: ABO/RH(D): A POS

## 2015-11-21 LAB — GLUCOSE, CAPILLARY
GLUCOSE-CAPILLARY: 112 mg/dL — AB (ref 65–99)
GLUCOSE-CAPILLARY: 102 mg/dL — AB (ref 65–99)
GLUCOSE-CAPILLARY: 189 mg/dL — AB (ref 65–99)
Glucose-Capillary: 118 mg/dL — ABNORMAL HIGH (ref 65–99)

## 2015-11-21 LAB — PREPARE RBC (CROSSMATCH)

## 2015-11-21 SURGERY — LEFT HEART CATH AND CORONARY ANGIOGRAPHY

## 2015-11-21 MED ORDER — BISACODYL 5 MG PO TBEC
5.0000 mg | DELAYED_RELEASE_TABLET | Freq: Once | ORAL | Status: AC
  Administered 2015-11-21: 5 mg via ORAL
  Filled 2015-11-21: qty 1

## 2015-11-21 MED ORDER — SODIUM CHLORIDE 0.9 % WEIGHT BASED INFUSION: 1.0000 mL/kg/h | INTRAVENOUS | Status: DC

## 2015-11-21 MED ORDER — ALBUTEROL SULFATE (2.5 MG/3ML) 0.083% IN NEBU
2.5000 mg | INHALATION_SOLUTION | Freq: Once | RESPIRATORY_TRACT | Status: AC
  Administered 2015-11-21: 2.5 mg via RESPIRATORY_TRACT

## 2015-11-21 MED ORDER — DEXTROSE 5 % IV SOLN
750.0000 mg | INTRAVENOUS | Status: DC
  Filled 2015-11-21: qty 750

## 2015-11-21 MED ORDER — VERAPAMIL HCL 2.5 MG/ML IV SOLN
INTRAVENOUS | Status: DC | PRN
  Administered 2015-11-21: 10:00:00 via INTRA_ARTERIAL

## 2015-11-21 MED ORDER — TEMAZEPAM 15 MG PO CAPS
15.0000 mg | ORAL_CAPSULE | Freq: Once | ORAL | Status: DC | PRN
Start: 2015-11-21 — End: 2015-11-21

## 2015-11-21 MED ORDER — DEXMEDETOMIDINE HCL IN NACL 400 MCG/100ML IV SOLN
0.1000 ug/kg/h | INTRAVENOUS | Status: DC
  Filled 2015-11-21: qty 100

## 2015-11-21 MED ORDER — SODIUM CHLORIDE 0.9% FLUSH: 3.0000 mL | Freq: Two times a day (BID) | INTRAVENOUS | Status: DC

## 2015-11-21 MED ORDER — DOPAMINE-DEXTROSE 3.2-5 MG/ML-% IV SOLN: 0.0000 ug/kg/min | INTRAVENOUS | Status: DC

## 2015-11-21 MED ORDER — LIDOCAINE HCL (PF) 1 % IJ SOLN
INTRAMUSCULAR | Status: AC
  Filled 2015-11-21: qty 30

## 2015-11-21 MED ORDER — OMEGA-3-ACID ETHYL ESTERS 1 G PO CAPS
1000.0000 mg | ORAL_CAPSULE | Freq: Every day | ORAL | Status: DC
  Filled 2015-11-21: qty 1

## 2015-11-21 MED ORDER — SODIUM CHLORIDE 0.9 % IV SOLN: 250.0000 mL | INTRAVENOUS | Status: DC | PRN

## 2015-11-21 MED ORDER — HEPARIN (PORCINE) IN NACL 2-0.9 UNIT/ML-% IJ SOLN
INTRAMUSCULAR | Status: AC
Start: 2015-11-21 — End: 2015-11-21
  Filled 2015-11-21: qty 1000

## 2015-11-21 MED ORDER — ASPIRIN 81 MG PO CHEW: 81.0000 mg | CHEWABLE_TABLET | ORAL | Status: DC

## 2015-11-21 MED ORDER — NITROGLYCERIN 0.4 MG SL SUBL: 0.4000 mg | SUBLINGUAL_TABLET | SUBLINGUAL | Status: DC | PRN

## 2015-11-21 MED ORDER — BISACODYL 5 MG PO TBEC: 5.0000 mg | DELAYED_RELEASE_TABLET | Freq: Once | ORAL | Status: DC

## 2015-11-21 MED ORDER — ALPRAZOLAM 0.25 MG PO TABS: 0.2500 mg | ORAL_TABLET | ORAL | Status: DC | PRN

## 2015-11-21 MED ORDER — IOHEXOL 350 MG/ML SOLN
INTRAVENOUS | Status: DC | PRN
  Administered 2015-11-21: 70 mL via INTRA_ARTERIAL

## 2015-11-21 MED ORDER — CHLORHEXIDINE GLUCONATE 4 % EX LIQD
60.0000 mL | Freq: Once | CUTANEOUS | Status: AC
  Administered 2015-11-22: 4 via TOPICAL

## 2015-11-21 MED ORDER — NITROGLYCERIN IN D5W 200-5 MCG/ML-% IV SOLN: 2.0000 ug/min | INTRAVENOUS | Status: DC

## 2015-11-21 MED ORDER — MAGNESIUM SULFATE 50 % IJ SOLN
40.0000 meq | INTRAMUSCULAR | Status: DC
Start: 2015-11-22 — End: 2015-11-22
  Filled 2015-11-21: qty 10

## 2015-11-21 MED ORDER — PLASMA-LYTE 148 IV SOLN
INTRAVENOUS | Status: AC
  Administered 2015-11-22: 500 mL
  Filled 2015-11-21: qty 2.5

## 2015-11-21 MED ORDER — NITROGLYCERIN IN D5W 200-5 MCG/ML-% IV SOLN
2.0000 ug/min | INTRAVENOUS | Status: DC
  Administered 2015-11-22: 10 ug/min via INTRAVENOUS
  Filled 2015-11-21: qty 250

## 2015-11-21 MED ORDER — EPINEPHRINE HCL 1 MG/ML IJ SOLN
0.0000 ug/min | INTRAVENOUS | Status: DC
  Filled 2015-11-21: qty 4

## 2015-11-21 MED ORDER — PHENYLEPHRINE HCL 10 MG/ML IJ SOLN
30.0000 ug/min | INTRAVENOUS | Status: DC
  Filled 2015-11-21: qty 2

## 2015-11-21 MED ORDER — SODIUM CHLORIDE 0.9 % IV SOLN
INTRAVENOUS | Status: AC
  Administered 2015-11-22: 69.8 mL/h via INTRAVENOUS
  Filled 2015-11-21: qty 40

## 2015-11-21 MED ORDER — MIDAZOLAM HCL 2 MG/2ML IJ SOLN
INTRAMUSCULAR | Status: AC
  Filled 2015-11-21: qty 2

## 2015-11-21 MED ORDER — GLIPIZIDE ER 2.5 MG PO TB24
2.5000 mg | ORAL_TABLET | Freq: Every day | ORAL | Status: DC
  Filled 2015-11-21: qty 1

## 2015-11-21 MED ORDER — ASPIRIN 81 MG PO CHEW: 81.0000 mg | CHEWABLE_TABLET | Freq: Every day | ORAL | Status: DC

## 2015-11-21 MED ORDER — DEXTROSE 5 % IV SOLN
30.0000 ug/min | INTRAVENOUS | Status: DC
  Filled 2015-11-21: qty 2

## 2015-11-21 MED ORDER — PIOGLITAZONE HCL 45 MG PO TABS
45.0000 mg | ORAL_TABLET | Freq: Every day | ORAL | Status: DC
  Administered 2015-11-21: 45 mg via ORAL
  Filled 2015-11-21 (×2): qty 1

## 2015-11-21 MED ORDER — SODIUM CHLORIDE 0.9 % IV SOLN
INTRAVENOUS | Status: DC
  Filled 2015-11-21: qty 30

## 2015-11-21 MED ORDER — HEPARIN SODIUM (PORCINE) 1000 UNIT/ML IJ SOLN
INTRAMUSCULAR | Status: AC
  Filled 2015-11-21: qty 1

## 2015-11-21 MED ORDER — CANAGLIFLOZIN 300 MG PO TABS: 300.0000 mg | ORAL_TABLET | Freq: Every day | ORAL | Status: DC

## 2015-11-21 MED ORDER — ONDANSETRON HCL 4 MG/2ML IJ SOLN: 4.0000 mg | Freq: Four times a day (QID) | INTRAMUSCULAR | Status: DC | PRN

## 2015-11-21 MED ORDER — FENTANYL CITRATE (PF) 100 MCG/2ML IJ SOLN
INTRAMUSCULAR | Status: DC | PRN
  Administered 2015-11-21 (×2): 50 ug via INTRAVENOUS

## 2015-11-21 MED ORDER — CHLORHEXIDINE GLUCONATE 4 % EX LIQD: 60.0000 mL | Freq: Once | CUTANEOUS | Status: DC

## 2015-11-21 MED ORDER — METOPROLOL TARTRATE 25 MG PO TABS
25.0000 mg | ORAL_TABLET | Freq: Two times a day (BID) | ORAL | Status: DC
  Administered 2015-11-21 (×2): 25 mg via ORAL
  Filled 2015-11-21 (×3): qty 1

## 2015-11-21 MED ORDER — METOPROLOL TARTRATE 12.5 MG HALF TABLET
12.5000 mg | ORAL_TABLET | Freq: Once | ORAL | Status: AC
  Administered 2015-11-22: 12.5 mg via ORAL
  Filled 2015-11-21: qty 1

## 2015-11-21 MED ORDER — SODIUM CHLORIDE 0.9% FLUSH: 3.0000 mL | INTRAVENOUS | Status: DC | PRN

## 2015-11-21 MED ORDER — LIDOCAINE HCL (PF) 1 % IJ SOLN
INTRAMUSCULAR | Status: DC | PRN
  Administered 2015-11-21: 5 mL via INTRADERMAL

## 2015-11-21 MED ORDER — OXYCODONE-ACETAMINOPHEN 5-325 MG PO TABS: 1.0000 | ORAL_TABLET | ORAL | Status: DC | PRN

## 2015-11-21 MED ORDER — CHLORHEXIDINE GLUCONATE 0.12 % MT SOLN: 15.0000 mL | Freq: Once | OROMUCOSAL | Status: DC

## 2015-11-21 MED ORDER — CHLORHEXIDINE GLUCONATE 4 % EX LIQD
60.0000 mL | Freq: Once | CUTANEOUS | Status: AC
  Administered 2015-11-21: 4 via TOPICAL
  Filled 2015-11-21: qty 60

## 2015-11-21 MED ORDER — DEXMEDETOMIDINE HCL IN NACL 400 MCG/100ML IV SOLN
0.1000 ug/kg/h | INTRAVENOUS | Status: AC
  Administered 2015-11-22: .3 ug/kg/h via INTRAVENOUS
  Filled 2015-11-21: qty 100

## 2015-11-21 MED ORDER — DIAZEPAM 5 MG PO TABS: 5.0000 mg | ORAL_TABLET | Freq: Once | ORAL | Status: DC

## 2015-11-21 MED ORDER — SODIUM CHLORIDE 0.9 % IV SOLN
1250.0000 mg | INTRAVENOUS | Status: AC
  Administered 2015-11-22: 1250 mg via INTRAVENOUS
  Filled 2015-11-21: qty 1250

## 2015-11-21 MED ORDER — TEMAZEPAM 15 MG PO CAPS: 15.0000 mg | ORAL_CAPSULE | Freq: Once | ORAL | Status: DC | PRN

## 2015-11-21 MED ORDER — DEXTROSE 5 % IV SOLN: 1.5000 g | INTRAVENOUS | Status: DC

## 2015-11-21 MED ORDER — SODIUM CHLORIDE 0.9 % IV SOLN
INTRAVENOUS | Status: AC
  Administered 2015-11-22: 1.4 [IU]/h via INTRAVENOUS
  Filled 2015-11-21: qty 2.5

## 2015-11-21 MED ORDER — VANCOMYCIN HCL 10 G IV SOLR: 1250.0000 mg | INTRAVENOUS | Status: DC

## 2015-11-21 MED ORDER — BROMOCRIPTINE MESYLATE 2.5 MG PO TABS
1.2500 mg | ORAL_TABLET | Freq: Every day | ORAL | Status: DC
  Administered 2015-11-21: 1.25 mg via ORAL
  Filled 2015-11-21: qty 1

## 2015-11-21 MED ORDER — ROSUVASTATIN CALCIUM 20 MG PO TABS
40.0000 mg | ORAL_TABLET | Freq: Every day | ORAL | Status: DC
  Administered 2015-11-21 – 2015-11-27 (×6): 40 mg via ORAL
  Filled 2015-11-21 (×3): qty 2
  Filled 2015-11-21: qty 1
  Filled 2015-11-21 (×2): qty 2

## 2015-11-21 MED ORDER — MIDAZOLAM HCL 2 MG/2ML IJ SOLN
INTRAMUSCULAR | Status: DC | PRN
  Administered 2015-11-21 (×2): 1 mg via INTRAVENOUS

## 2015-11-21 MED ORDER — DEXTROSE 5 % IV SOLN
1.5000 g | INTRAVENOUS | Status: AC
  Administered 2015-11-22: 1500 mg via INTRAVENOUS
  Administered 2015-11-22: 750 mg via INTRAVENOUS
  Filled 2015-11-21 (×2): qty 1.5

## 2015-11-21 MED ORDER — SODIUM CHLORIDE 0.9 % IV SOLN
INTRAVENOUS | Status: DC
  Filled 2015-11-21: qty 40

## 2015-11-21 MED ORDER — MAGNESIUM SULFATE 50 % IJ SOLN
40.0000 meq | INTRAMUSCULAR | Status: DC
  Filled 2015-11-21: qty 10

## 2015-11-21 MED ORDER — PAPAVERINE HCL 30 MG/ML IJ SOLN
INTRAMUSCULAR | Status: DC
  Filled 2015-11-21: qty 2.5

## 2015-11-21 MED ORDER — HEPARIN SODIUM (PORCINE) 1000 UNIT/ML IJ SOLN
INTRAMUSCULAR | Status: DC | PRN
  Administered 2015-11-21: 4500 [IU] via INTRAVENOUS

## 2015-11-21 MED ORDER — ACETAMINOPHEN 325 MG PO TABS: 650.0000 mg | ORAL_TABLET | ORAL | Status: DC | PRN

## 2015-11-21 MED ORDER — CHLORHEXIDINE GLUCONATE 0.12 % MT SOLN
15.0000 mL | Freq: Once | OROMUCOSAL | Status: DC
  Filled 2015-11-21: qty 15

## 2015-11-21 MED ORDER — METOPROLOL TARTRATE 12.5 MG HALF TABLET: 12.5000 mg | ORAL_TABLET | Freq: Once | ORAL | Status: DC

## 2015-11-21 MED ORDER — DOPAMINE-DEXTROSE 3.2-5 MG/ML-% IV SOLN
0.0000 ug/kg/min | INTRAVENOUS | Status: DC
  Filled 2015-11-21: qty 250

## 2015-11-21 MED ORDER — POTASSIUM CHLORIDE 2 MEQ/ML IV SOLN
80.0000 meq | INTRAVENOUS | Status: DC
  Filled 2015-11-21: qty 40

## 2015-11-21 MED ORDER — VERAPAMIL HCL 2.5 MG/ML IV SOLN
INTRAVENOUS | Status: AC
Start: 2015-11-21 — End: 2015-11-21
  Filled 2015-11-21: qty 2

## 2015-11-21 MED ORDER — DIAZEPAM 5 MG PO TABS
5.0000 mg | ORAL_TABLET | Freq: Once | ORAL | Status: AC
  Administered 2015-11-22: 5 mg via ORAL
  Filled 2015-11-21: qty 1

## 2015-11-21 MED ORDER — ENOXAPARIN SODIUM 30 MG/0.3ML ~~LOC~~ SOLN: 30.0000 mg | SUBCUTANEOUS | Status: DC

## 2015-11-21 MED ORDER — HEPARIN (PORCINE) IN NACL 2-0.9 UNIT/ML-% IJ SOLN
INTRAMUSCULAR | Status: DC | PRN
  Administered 2015-11-21: 1000 mL

## 2015-11-21 MED ORDER — BENAZEPRIL HCL 20 MG PO TABS
20.0000 mg | ORAL_TABLET | Freq: Every day | ORAL | Status: DC
  Administered 2015-11-21: 20 mg via ORAL
  Filled 2015-11-21 (×2): qty 1

## 2015-11-21 MED ORDER — FENTANYL CITRATE (PF) 100 MCG/2ML IJ SOLN
INTRAMUSCULAR | Status: AC
  Filled 2015-11-21: qty 2

## 2015-11-21 MED ORDER — SODIUM CHLORIDE 0.9 % IV SOLN
INTRAVENOUS | Status: DC
  Filled 2015-11-21: qty 2.5

## 2015-11-21 MED ORDER — INSULIN ASPART 100 UNIT/ML ~~LOC~~ SOLN: 0.0000 [IU] | Freq: Three times a day (TID) | SUBCUTANEOUS | Status: DC

## 2015-11-21 MED ORDER — SODIUM CHLORIDE 0.9 % IV SOLN
INTRAVENOUS | Status: DC
  Administered 2015-11-21: 08:00:00 via INTRAVENOUS

## 2015-11-21 SURGICAL SUPPLY — 9 items
CATH INFINITI 5 FR JL3.5 (CATHETERS) ×3 IMPLANT
CATH INFINITI JR4 5F (CATHETERS) ×3 IMPLANT
DEVICE RAD COMP TR BAND LRG (VASCULAR PRODUCTS) ×3 IMPLANT
GLIDESHEATH SLEND A-KIT 6F 22G (SHEATH) ×3 IMPLANT
KIT HEART LEFT (KITS) ×3 IMPLANT
PACK CARDIAC CATHETERIZATION (CUSTOM PROCEDURE TRAY) ×3 IMPLANT
TRANSDUCER W/STOPCOCK (MISCELLANEOUS) ×3 IMPLANT
TUBING CIL FLEX 10 FLL-RA (TUBING) ×6 IMPLANT
WIRE SAFE-T 1.5MM-J .035X260CM (WIRE) ×3 IMPLANT

## 2015-11-21 NOTE — Progress Notes (Signed)
TR BAND REMOVAL  LOCATION:    Radial  Right DEFLATED PER PROTOCOL:     TIME BAND OFF / DRESSING APPLIED:    13:30:00  SITE UPON ARRIVAL:    Level 0  SITE AFTER BAND REMOVAL:    Level 0  CIRCULATION SENSATION AND MOVEMENT:    Within Normal Limits : Rt radial post assessment is within normal limits. Rt radial site is clean and dry.  COMMENTS:   Pt leaves Cath Lab in stable condition. Rt radial site unremarkable.

## 2015-11-21 NOTE — Progress Notes (Signed)
Patient has arrived on unit from cath lab. Patient oriented to unit, assessed, placed on tele x2 verification, VS were stable, and Nils Pyle MD at bedside with family.

## 2015-11-21 NOTE — Consult Note (Signed)
[address]       ,[place] [id number]             [phone]        Sherry E Brose  Medical Record #[id number] Date of Birth: 07-30-1951  Referring: Daneen Schick M.D. Primary Care: [name], MD  Chief Complaint:   Exertional angina  History of Present Illness:     Patient examined, coronary angiograms personally reviewed and counseled with patient and family.  [name] diabetic reformed smoker with class III angina. Stress test was markedly positive with ST segment changes in the inferior leads. Outpatient cardiac catheterization performed today demonstrates severe three-vessel coronary disease and probably preserved LV systolic function--echocardiogram pending. CABG has been recommended to the patient's cardiologist and I agree with that recommendation.  The patient has had pre-CABG Doppler exam showing no significant carotid disease and positive Allen's test left radial artery. ABIs are normal. PFTs show mild-moderate COPD. Echocardiogram and chest x-ray are pending.   Current Activity/ Functional Status: Patient lives with his wife. 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   Zubrod Score: At the time of surgery this patient's most appropriate activity status/level should be described as: _0     0    Normal activity, no symptoms _1     1    Restricted in physical strenuous activity but ambulatory, able to do out light work _2     2    Ambulatory and capable of self care, unable to do work activities, up and about                 more than 50%  Of the time                            _3     3    Only limited self care, in bed greater than 50% of waking hours _4     4    Completely disabled, no self care, confined to bed or chair _5     5    Moribund  Past Medical History  Diagnosis Date  . Diabetes mellitus   . Hyperlipidemia   .  Hypertension   . Carpal tunnel syndrome     both hands    Past Surgical History  Procedure Laterality Date  . Wrist ganglion excision    . Frozen shoulder release    . Colonsocopy  2012    History  Smoking status  . Former Smoker -- 0.25 packs/[place] for 20 years  . Types: Cigarettes  . Quit date: 10/07/2013  Smokeless tobacco  . Never Used    Comment: quit cigarettes, might have a cigar 1 X month    History  Alcohol Use  . 1.8 oz/week  . 3 Standard drinks or equivalent per week    Social History   Social History  . Marital Status: Married    Spouse Name: N/A  . Number of Children: 5  . Years of Education: N/A   Occupational History  . Dispatcher    Social History Main Topics  . Smoking status: Former Smoker -- 0.25 packs/[place] for 20 years    Types: Cigarettes    Quit date: 10/07/2013  . Smokeless tobacco: Never Used     Comment: quit cigarettes, might have a cigar 1 X month  . Alcohol Use:  1.8 oz/week    3 Standard drinks or equivalent per week  . Drug Use: No  . Sexual Activity: Not on file   Other Topics Concern  . 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 0.9 %  sodium chloride infusion  250 mL Intravenous PRN [name], MD      . acetaminophen (TYLENOL) tablet 650 mg  650 mg Oral Q4H PRN [name], MD      . Derrill Memo ON 11/22/2015] aminocaproic acid (AMICAR) 10 g in sodium chloride 0.9 % 100 mL infusion   Intravenous To OR [name], MD      . Derrill Memo ON 11/22/2015] aspirin chewable tablet 81 mg  81 mg Oral Daily [name], MD      . aspirin chewable tablet 81 mg  81 mg Oral Daily [name], MD      . benazepril (LOTENSIN) tablet 20 mg  20 mg Oral Daily [name], MD      . bromocriptine (PARLODEL) tablet 1.25 mg  1.25 mg Oral QHS [name], MD      . Derrill Memo ON 11/22/2015] canagliflozin [facility]) tablet 300 mg  300 mg Oral QAC breakfast [name], MD      . Derrill Memo ON 11/22/2015] cefUROXime (ZINACEF) 1.5 g in dextrose 5 % 50 mL IVPB  1.5 g Intravenous To OR Karren Cobble, RPH      . [START ON 11/22/2015] cefUROXime (ZINACEF) 750 mg in dextrose 5 % 50 mL IVPB  750 mg Intravenous To OR [name], MD      . Derrill Memo ON 11/22/2015] dexmedetomidine (PRECEDEX) 400 MCG/100ML (4 mcg/mL) infusion  0.1-0.7 mcg/kg/hr Intravenous To OR [name], MD      . Derrill Memo ON 11/22/2015] DOPamine (INTROPIN) 800 mg in dextrose 5 % 250 mL (3.2 mg/mL) infusion  0-10 mcg/kg/min Intravenous To OR [name], MD      . Derrill Memo ON 11/22/2015] enoxaparin (LOVENOX) injection 30 mg  30 mg Subcutaneous Q24H [name], MD      . Derrill Memo ON 11/22/2015] EPINEPHrine (ADRENALIN) 4 mg in dextrose 5 % 250 mL (0.016 mg/mL) infusion  0-10 mcg/min Intravenous To OR [name], MD      . Derrill Memo ON 11/22/2015] glipiZIDE (GLUCOTROL XL) 24 hr tablet 2.5 mg  2.5 mg Oral Q breakfast [name], MD      . Derrill Memo ON 11/22/2015] heparin 2,500 Units, papaverine 30 mg in electrolyte-148 (PLASMALYTE-148) 500 mL irrigation   Irrigation To OR [name], MD      . Derrill Memo ON 11/22/2015] heparin 30,000 units/NS 1000 mL solution for CELLSAVER   Other To OR [name], MD      . Derrill Memo ON 11/22/2015] insulin aspart (novoLOG) injection 0-15 Units  0-15 Units Subcutaneous TID WC [name], MD      . Derrill Memo ON 11/22/2015] insulin regular (NOVOLIN R,HUMULIN R) 250 Units in sodium chloride 0.9 % 250 mL (1 Units/mL) infusion   Intravenous To OR [name], MD      . Derrill Memo ON 11/22/2015] magnesium sulfate (IV Push/IM) injection 40 mEq  40  mEq Other To OR [name], MD      . metoprolol tartrate (LOPRESSOR) tablet 25 mg  25 mg Oral BID [name], MD   25 mg at  11/21/15 1139  . nitroGLYCERIN (NITROSTAT) SL tablet 0.4 mg  0.4 mg Sublingual Q5 min PRN [name], MD      . Derrill Memo ON 11/22/2015] nitroGLYCERIN 50 mg in dextrose 5 % 250 mL (0.2 mg/mL) infusion  2-200 mcg/min Intravenous To OR [name], MD      . omega-3 acid ethyl esters (LOVAZA) capsule 1,000 mg  1,000 mg Oral Daily [name], MD      . ondansetron [facility]) injection 4 mg  4 mg Intravenous Q6H PRN [name], MD      . oxyCODONE-acetaminophen (PERCOCET/ROXICET) 5-325 MG per tablet 1-2 tablet  1-2 tablet Oral Q4H PRN [name], MD      . Derrill Memo ON 11/22/2015] phenylephrine (NEO-SYNEPHRINE) 20 mg in dextrose 5 % 250 mL (0.08 mg/mL) infusion  30-200 mcg/min Intravenous To OR [name], MD      . pioglitazone (ACTOS) tablet 45 mg  45 mg Oral Daily [name], MD      . Derrill Memo ON 11/22/2015] potassium chloride injection 80 mEq  80 mEq Other To OR [name], MD      . rosuvastatin (CRESTOR) tablet 40 mg  40 mg Oral Daily [name], MD      . sodium chloride flush (NS) 0.9 % injection 3 mL  3 mL Intravenous Q12H [name], MD      . sodium chloride flush (NS) 0.9 % injection 3 mL  3 mL Intravenous PRN [name], MD      . Derrill Memo ON 11/22/2015] vancomycin (VANCOCIN) 1,250 mg in sodium chloride 0.9 % 250 mL IVPB  1,250 mg Intravenous To OR Karren Cobble, [facility]        Prescriptions prior to admission  Medication Sig Dispense Refill Last Dose  . aspirin 81 MG tablet Take 81 mg by mouth daily.    11/21/2015 at 0545  . benazepril (LOTENSIN) 20 MG tablet Take 1 tablet by mouth  daily 30 tablet 1 11/20/2015  . Blood Glucose Monitoring Suppl (ACCU-CHEK AVIVA PLUS) W/DEVICE KIT Test once per [place] 1 kit 0   . bromocriptine (PARLODEL) 2.5 MG tablet Take 0.5 tablets (1.25 mg total) by mouth at bedtime. 45 tablet 3 11/20/2015  . canagliflozin (INVOKANA) 300 MG TABS tablet Take 300 mg by mouth daily before breakfast. 30 tablet 3 11/20/2015  . CINNAMON PO Take 2,000 mg  by mouth daily.    11/20/2015  . glipiZIDE (GLUCOTROL XL) 2.5 MG 24 hr tablet Take 1 tablet (2.5 mg total) by mouth daily with breakfast. 90 tablet 3 11/20/2015  . glucose blood (ACCU-CHEK AVIVA PLUS) test strip Diagnosis code is E 11.9 100 each 0 11/20/2015  . Lancets (ACCU-CHEK SOFT TOUCH) lancets Dispense for Aviva plus and diagnosis code is E 11.9 100 each 0   . metFORMIN (GLUCOPHAGE-XR) 500 MG 24 hr tablet Take 4 tablets by mouth  daily with breakfast 360 tablet 3 11/19/2015  . Multiple Vitamin (MULTIVITAMIN) tablet Take 1 tablet by mouth daily.     11/20/2015  . nitroGLYCERIN (NITROSTAT) 0.4 MG SL tablet Place 1 tablet (0.4 mg total) under the tongue every 5 (five) minutes as needed for chest pain. 25 tablet 3   . Omega-3 Fatty Acids (FISH OIL) 1000  MG CAPS Take 1,000 mg by mouth daily.    11/20/2015  . pioglitazone (ACTOS) 45 MG tablet Take 1 tablet by mouth  daily 90 tablet 3 11/20/2015  . rosuvastatin (CRESTOR) 40 MG tablet Take 40 mg by mouth daily.   11/20/2015    Family History  Problem Relation Age of Onset  . Hyperlipidemia Sister   . Diabetes Sister   . Hypertension Sister   . Anuerysm Brother   . Diabetes Maternal Grandmother   . Hyperlipidemia Sister   . Kidney disease Sister   . Heart attack Neg Hx      Review of Systems:       Cardiac Review of Systems: Y or N  Chest Pain [  yes  ]  Resting SOB [ no  ] Exertional SOB  [ no ]  Orthopnea [ no ]   Pedal Edema [ no  ]    Palpitations [no  ] Syncope  [ no ]   Presyncope [ no  ]  General Review of Systems: [Y] = yes [  ]=no Constitional: recent weight change [ loss of 5 pounds in the last 4 weeks ]; anorexia [  ]; fatigue [  ]; nausea [  ]; night sweats [  ]; fever [  ]; or chills [  ]                                                               Dental: poor dentition[ yes-complains of loose teeth in maxilla in front ]; Last Dentist visit:greater than one year   Eye : blurred vision [  ]; diplopia [   ]; vision changes [  ];   Amaurosis fugax[  ]; Resp: cough [  ];  wheezing[  ];  hemoptysis[  ]; shortness of breath[  ]; paroxysmal nocturnal dyspnea[  ]; dyspnea on exertion[  ]; or orthopnea[  ];  GI:  gallstones[  ], vomiting[  ];  dysphagia[  ]; melena[  ];  hematochezia [  ]; heartburn[  ];   Hx of  Colonoscopy[  ]; GU: kidney stones [  ]; hematuria[  ];   dysuria [  ];  nocturia[  ];  history of     obstruction [  ]; urinary frequency [ yes with polypectomy-benign ]             Skin: rash, swelling[  ];, hair loss[  ];  peripheral edema[  ];  or itching[  ]; Musculosketetal: myalgias[  ];  joint swelling[  ];  joint erythema[  ];  joint pain[  ];  back pain[  ];  Heme/Lymph: bruising[  ];  bleeding[  ];  anemia[  ];  Neuro: TIA[  ];  headaches[  ];  stroke[  ];  vertigo[  ];  seizures[  ];   paresthesias[  ];  difficulty walking[  ];  Psych:depression[  ]; anxiety[  ];  Endocrine: diabetes[yes on oral meds  ];  thyroid dysfunction[  ];  Immunizations: Flu [  ]; Pneumococcal[  ];  Other:right-hand dominant  Physical Exam: BP 111/73 mmHg  Pulse 64  Temp(Src) 98 F (36.7 C) (Oral)  Resp 16  Ht 5' 7.5" (1.715 m)  Wt 190 lb 9.6 oz (86.456 kg)  BMI 29.39 kg/m2  SpO2 95%      Physical Exam  General: alert comfortable AA well-nourished male in telemetry room accompanied by family members HEENT: Normocephalic pupils equal , dentition suboptimal with poor dental hygiene Neck: Supple without JVD, adenopathy, or bruit Chest: Clear to auscultation, symmetrical breath sounds, no rhonchi, no tenderness             or deformity Cardiovascular: Regular rate and rhythm, no murmur, no gallop, peripheral pulses             palpable in all extremities Abdomen:  Soft, nontender, no palpable mass or organomegaly Extremities: Warm, well-perfused, no clubbing cyanosis edema or tenderness,no hematoma in the right wrist cath site              no venous stasis changes of the legs Rectal/GU: Deferred Neuro: Grossly  non--focal and symmetrical throughout Skin: Clean and dry without rash or ulceration    Diagnostic Studies & Laboratory data:     Recent Radiology Findings:   chest x-ray pending  I have independently reviewed the above radiologic studies.  Recent Lab Findings: Lab Results  Component Value Date   WBC 5.0 11/14/2015   HGB 14.1 11/14/2015   HCT 41.1 11/14/2015   PLT 177 11/14/2015   GLUCOSE 144* 11/14/2015   CHOL 169 08/22/2014   TRIG 146.0 08/22/2014   HDL 51.80 08/22/2014   LDLDIRECT 204.8 09/05/2013   LDLCALC 88 08/22/2014   ALT 32 08/22/2014   AST 25 08/22/2014   NA 138 11/14/2015   K 4.4 11/14/2015   CL 105 11/14/2015   CREATININE 1.19 11/14/2015   BUN 20 11/14/2015   CO2 22 11/14/2015   TSH 0.99 08/22/2014   INR 1.02 11/14/2015   HGBA1C 7.3 11/07/2015      Assessment / Plan:     Severe multivessel coronary artery disease    Acute coronary syndrome-unstable angina    Poorly controlled diabetes mellitus  Plan multivessel CABG in a.m. I discussed the procedure of CABG in detail the patient.  He understands the deficits of surgery, the alternatives to surgery, and expected postoperative recovery. We discussed the risks to the surgery including a 1-2 percent risk of major morbidity or mortality--stroke, sepsis, MI, multisystem failure. He understands and agrees to proceed with surgery.  _0 @ 11/21/2015 6:00 PM

## 2015-11-21 NOTE — Interval H&P Note (Signed)
Cath Lab Visit (complete for each Cath Lab visit)  Clinical Evaluation Leading to the Procedure:   ACS: Yes.    Non-ACS:    Anginal Classification: CCS III  Anti-ischemic medical therapy: No Therapy  Non-Invasive Test Results: High-risk stress test findings: cardiac mortality >3%/year  Prior CABG: No previous CABG      History and Physical Interval Note:  11/21/2015 9:48 AM  [name]  has presented today for surgery, with the diagnosis of abnormal stress test  The various methods of treatment have been discussed with the patient and family. After consideration of risks, benefits and other options for treatment, the patient has consented to  Procedure(s): Left Heart Cath and Coronary Angiography (N/A) as a surgical intervention .  The patient's history has been reviewed, patient examined, no change in status, stable for surgery.  I have reviewed the patient's chart and labs.  Questions were answered to the patient's satisfaction.     Jerome Vaughn

## 2015-11-21 NOTE — Progress Notes (Signed)
Pre-op Cardiac Surgery  Carotid Findings:  Bilateral: No significant (1-39%) ICA stenosis. Antegrade vertebral flow.    Upper Extremity Right Left  Brachial Pressures 114 111  Radial Waveforms Tri Tri  Ulnar Waveforms Tri Tri  Palmar Arch (Allen's Test) Normal  Decreases >50% with radial compression, normal with ulnar compression    Bilateral pedal arteries within normal limits.    Landry Mellow, RDMS, RVT 11/21/2015

## 2015-11-21 NOTE — Research (Signed)
CADLAD Informed Consent   Subject Name: Jerome Vaughn  Subject met inclusion and exclusion criteria.  The informed consent form, study requirements and expectations were reviewed with the subject and questions and concerns were addressed prior to the signing of the consent form.  The subject verbalized understanding of the trail requirements.  The subject agreed to participate in the CAADLAD trial and signed the informed consent.  The informed consent was obtained prior to performance of any protocol-specific procedures for the subject.  A copy of the signed informed consent was given to the subject and a copy was placed in the subject's medical record.  Hedrick,Tammy W 11/21/2015, 0725

## 2015-11-21 NOTE — H&P (View-Only) (Signed)
Cardiology Office Note:    Date:  11/14/2015   ID:  Jerome Vaughn, DOB 28-Sep-1951, MRN [id number]  PCP:  Jerome Morale, MD  Endocrinologist:  Dr. Loanne Vaughn Cardiologist:  New - seeing Dr. Darlin Vaughn today  Electrophysiologist:  N/a  Referring MD:  Dr. Loanne Vaughn   Chief Complaint  Patient presents with  . Chest Pain    Consult  . Abnormal Stress Test    History of Present Illness:     Jerome Vaughn is a 65 y.o. male former smoker with a hx of DM, HTN, HL.  He has had a hx of L arm numbness and tingling for several mos. This is not related to exertion and seems worse when he lies prone.  He has noted exertional substernal chest tightness/burning over the past few mos.  It comes on with walking in the evening.  He denies assoc symptoms.  He denies orthopnea, PND, edema, syncope.  He saw Dr. Loanne Vaughn recently and was referred for an ETT.  His ETT was markedly abnormal and he was added on to my schedule.  He notes reproduction of his chest symptoms on the treadmill today.     Past Medical History  Diagnosis Date  . Diabetes mellitus   . Hyperlipidemia   . Hypertension   . Carpal tunnel syndrome     both hands    Past Surgical History  Procedure Laterality Date  . Wrist ganglion excision    . Frozen shoulder release    . Colonsocopy  2012    Current Medications: Outpatient Prescriptions Prior to Visit  Medication Sig Dispense Refill  . aspirin 81 MG tablet Take 81 mg by mouth daily.     . benazepril (LOTENSIN) 20 MG tablet Take 1 tablet by mouth  daily 30 tablet 1  . Blood Glucose Monitoring Suppl (ACCU-CHEK AVIVA PLUS) W/DEVICE KIT Test once per [place] 1 kit 0  . bromocriptine (PARLODEL) 2.5 MG tablet Take 0.5 tablets (1.25 mg total) by mouth at bedtime. 45 tablet 3  . canagliflozin (INVOKANA) 300 MG TABS tablet Take 300 mg by mouth daily before breakfast. 30 tablet 3  . CINNAMON PO Take 2,000 mg by mouth daily.     Marland Kitchen glipiZIDE (GLUCOTROL XL) 2.5 MG 24 hr tablet Take 1 tablet  (2.5 mg total) by mouth daily with breakfast. 90 tablet 3  . glucose blood (ACCU-CHEK AVIVA PLUS) test strip Diagnosis code is E 11.9 100 each 0  . Lancets (ACCU-CHEK SOFT TOUCH) lancets Dispense for Aviva plus and diagnosis code is E 11.9 100 each 0  . metFORMIN (GLUCOPHAGE-XR) 500 MG 24 hr tablet Take 4 tablets by mouth  daily with breakfast 360 tablet 3  . Multiple Vitamin (MULTIVITAMIN) tablet Take 1 tablet by mouth daily.      . Omega-3 Fatty Acids (FISH OIL) 1000 MG CAPS Take 1,000 mg by mouth daily.     . pioglitazone (ACTOS) 45 MG tablet Take 1 tablet by mouth  daily 90 tablet 3  . INVOKANA 300 MG TABS tablet TAKE 300 MG BY MOUTH DAILY BEFORE BREAKFAST. (Patient not taking: Reported on 11/14/2015) 30 tablet 11  . rosuvastatin (CRESTOR) 40 MG tablet TAKE 1 TABLET DAILY AS DIRECTED FOR CHOLESTEROL (Patient not taking: Reported on 11/14/2015) 90 tablet 0   No facility-administered medications prior to visit.     Allergies:   Review of patient's allergies indicates no known allergies.   Social History   Social History  . Marital Status: Single  Spouse Name: N/A  . Number of Children: 5  . Years of Education: N/A   Occupational History  . Dispatcher    Social History Main Topics  . Smoking status: Former Smoker -- 0.25 packs/[place] for 20 years    Types: Cigarettes    Quit date: 10/07/2013  . Smokeless tobacco: Never Used     Comment: quit cigarettes, might have a cigar 1 X month  . Alcohol Use: 1.8 oz/week    3 Standard drinks or equivalent per week  . Drug Use: No  . Sexual Activity: Not Asked   Other Topics Concern  . 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 There is no history of Heart attack.   ROS:   Please see the history of present illness.    Review of Systems  Constitution: Negative for fever.  Respiratory: Negative for cough.   Hematologic/Lymphatic: Negative for bleeding problem.  Gastrointestinal: Negative for melena and vomiting.  All other systems reviewed and are negative.   Physical Exam:    VS:  BP 110/80 mmHg  Pulse 86  Ht 5' 7.5" (1.715 m)  Wt 193 lb 6.4 oz (87.726 kg)  BMI 29.83 kg/m2   GEN: Well nourished, well developed, in no acute distress HEENT: normal Neck: no JVD, no masses Cardiac: Normal S1/S2, RRR; no murmurs, rubs, or gallops, trace bilateral ankle edema;  no carotid bruits,  No FA bruits bilaterally Respiratory:  clear to auscultation bilaterally; no wheezing, rhonchi or rales GI: soft, nontender, nondistended  MS: no deformity or atrophy Skin: warm and dry  Neuro:   no focal deficits  Psych: Alert and oriented x 3, normal affect  Wt Readings from Last 3 Encounters:  11/14/15 193 lb 6.4 oz (87.726 kg)  11/07/15 194 lb (87.998 kg)  02/19/15 206 lb (93.441 kg)      Studies/Labs Reviewed:     EKG:  EKG is not  ordered today.  The ekg ordered today demonstrates n/a   Recent Labs: 02/19/2015: BUN 19; Creatinine, Ser 1.16; Potassium 4.0; Sodium 136   Recent Lipid Panel    Component Value Date/Time   CHOL 169 08/22/2014 1034   TRIG 146.0 08/22/2014 1034   HDL 51.80 08/22/2014 1034   CHOLHDL 3 08/22/2014 1034   VLDL 29.2 08/22/2014 1034   LDLCALC 88 08/22/2014 1034   LDLDIRECT 204.8 09/05/2013 1559    Additional studies/ records that were reviewed today include:   ETT 11/14/15 2 mm or greater ST depression in inferolateral leads at peak exercise  Echo 6/10 Mild LVH, EF 55%   ASSESSMENT:     1. Exertional angina (HCC)   2. Abnormal stress test   3. Essential hypertension   4. Controlled type 2 diabetes mellitus without  complication, without long-term current use of insulin ([place])   5. Hyperlipidemia   6. Left arm pain     PLAN:     In order of problems listed above:  1. Exertional angina - Patient presented to the office today for routine exercise treadmill test. He was referred by his endocrinologist. As noted, ETT is markedly abnormal with  findings suggestive of ischemia. He had reproduction of chest pain on the treadmill. Recommendation is to proceed with cardiac catheterization for definitive evaluation. I discussed this with Dr. Mare Ferrari (DOD), who agreed. He also saw the patient today.  Risks and benefits of cardiac catheterization have been discussed with the patient.  These include bleeding, infection, kidney damage, stroke, heart attack, death.  The patient understands these risks and is willing to proceed.   -  LHC in the next week  -  Continue ASA, statin  -  Rx for prn NTG  -  FU 2 weeks post cath  2. Abnormal ETT - As outlined above.  3. HTN - Controlled.  4. DM2 - Hold Metformin 24 hours pre and 48 hours post cath.  Hold Invokana, Glipizide AM of cath.  5. HL - Continue statin.  6. L arm pain - Symptoms are atypical for ischemia. If left arm symptoms continue after cardiac catheterization, consider further evaluation with primary care.    Medication Adjustments/Labs and Tests Ordered: Current medicines are reviewed at length with the patient today.  Concerns regarding medicines are outlined above.  Medication changes, Labs and Tests ordered today are outlined in the Patient Instructions noted below. Patient Instructions  Medication Instructions:  1. AN RX FOR NITROGLYCERIN HAS BEEN SENT IN AND YOU HAVE BEEN ADVISED AS TO HOW AND WHEN TO USE NTG  Labwork: 1. TODAY BMET, CBC W/DIFF, PT/INR  Testing/Procedures: Your physician has requested that you have a cardiac catheterization. Cardiac catheterization is used to diagnose and/or treat various heart conditions. Doctors may recommend  this procedure for a number of different reasons. The most common reason is to evaluate chest pain. Chest pain can be a symptom of coronary artery disease (CAD), and cardiac catheterization can show whether plaque is narrowing or blocking your heart's arteries. This procedure is also used to evaluate the valves, as well as measure the blood flow and oxygen levels in different parts of your heart. For further information please visit [email or web address]. Please follow instruction sheet, as given.  Follow-Up: 12/06/15 @ 11:30 WITH Shaquilla Kehres, PAC  Any Other Special Instructions Will Be Listed Below (If Applicable).  If you need a refill on your cardiac medications before your next appointment, please call your pharmacy.   Signed, Richardson Dopp, PA-C  11/14/2015 11:28 AM    [facility] [address], [place], [place]  [address] Phone: [phone]; Fax: [phone]

## 2015-11-22 ENCOUNTER — Inpatient Hospital Stay (HOSPITAL_COMMUNITY): Payer: BLUE CROSS/BLUE SHIELD | Admitting: Certified Registered Nurse Anesthetist

## 2015-11-22 ENCOUNTER — Inpatient Hospital Stay (HOSPITAL_COMMUNITY): Payer: BLUE CROSS/BLUE SHIELD

## 2015-11-22 ENCOUNTER — Other Ambulatory Visit: Payer: Self-pay

## 2015-11-22 ENCOUNTER — Encounter (HOSPITAL_COMMUNITY)
Admission: AD | Disposition: A | Discharge status: Home / Self Care | Payer: Self-pay | Source: Ambulatory Visit | Attending: Cardiothoracic Surgery

## 2015-11-22 ENCOUNTER — Encounter (HOSPITAL_COMMUNITY): Payer: Self-pay | Admitting: Interventional Cardiology

## 2015-11-22 DIAGNOSIS — I25110 Atherosclerotic heart disease of native coronary artery with unstable angina pectoris: Principal | ICD-10-CM

## 2015-11-22 DIAGNOSIS — Z951 Presence of aortocoronary bypass graft: Secondary | ICD-10-CM

## 2015-11-22 HISTORY — PX: TEE WITHOUT CARDIOVERSION: SHX5443

## 2015-11-22 HISTORY — PX: CORONARY ARTERY BYPASS GRAFT: SHX141

## 2015-11-22 LAB — POCT I-STAT, CHEM 8
BUN: 17 mg/dL (ref 6–20)
BUN: 14 mg/dL (ref 6–20)
BUN: 16 mg/dL (ref 6–20)
BUN: 14 mg/dL (ref 6–20)
BUN: 14 mg/dL (ref 6–20)
BUN: 13 mg/dL (ref 6–20)
BUN: 15 mg/dL (ref 6–20)
CALCIUM ION: 1.1 mmol/L — AB (ref 1.13–1.30)
CALCIUM ION: 1.13 mmol/L (ref 1.13–1.30)
CHLORIDE: 105 mmol/L (ref 101–111)
CHLORIDE: 105 mmol/L (ref 101–111)
CHLORIDE: 103 mmol/L (ref 101–111)
CHLORIDE: 102 mmol/L (ref 101–111)
CHLORIDE: 103 mmol/L (ref 101–111)
CHLORIDE: 107 mmol/L (ref 101–111)
CREATININE: 0.8 mg/dL (ref 0.61–1.24)
CREATININE: 0.7 mg/dL (ref 0.61–1.24)
CREATININE: 0.7 mg/dL (ref 0.61–1.24)
Calcium, Ion: 1.25 mmol/L (ref 1.13–1.30)
Calcium, Ion: 1.05 mmol/L — ABNORMAL LOW (ref 1.13–1.30)
Calcium, Ion: 1.23 mmol/L (ref 1.13–1.30)
Calcium, Ion: 1.06 mmol/L — ABNORMAL LOW (ref 1.13–1.30)
Calcium, Ion: 1.08 mmol/L — ABNORMAL LOW (ref 1.13–1.30)
Chloride: 102 mmol/L (ref 101–111)
Creatinine, Ser: 0.7 mg/dL (ref 0.61–1.24)
Creatinine, Ser: 0.6 mg/dL — ABNORMAL LOW (ref 0.61–1.24)
Creatinine, Ser: 0.8 mg/dL (ref 0.61–1.24)
Creatinine, Ser: 0.9 mg/dL (ref 0.61–1.24)
GLUCOSE: 130 mg/dL — AB (ref 65–99)
GLUCOSE: 129 mg/dL — AB (ref 65–99)
GLUCOSE: 139 mg/dL — AB (ref 65–99)
GLUCOSE: 116 mg/dL — AB (ref 65–99)
Glucose, Bld: 103 mg/dL — ABNORMAL HIGH (ref 65–99)
Glucose, Bld: 122 mg/dL — ABNORMAL HIGH (ref 65–99)
Glucose, Bld: 113 mg/dL — ABNORMAL HIGH (ref 65–99)
HCT: 36 % — ABNORMAL LOW (ref 39.0–52.0)
HCT: 30 % — ABNORMAL LOW (ref 39.0–52.0)
HCT: 32 % — ABNORMAL LOW (ref 39.0–52.0)
HEMATOCRIT: 39 % (ref 39.0–52.0)
HEMATOCRIT: 29 % — AB (ref 39.0–52.0)
HEMATOCRIT: 29 % — AB (ref 39.0–52.0)
HEMATOCRIT: 40 % (ref 39.0–52.0)
HEMOGLOBIN: 13.3 g/dL (ref 13.0–17.0)
HEMOGLOBIN: 9.9 g/dL — AB (ref 13.0–17.0)
Hemoglobin: 12.2 g/dL — ABNORMAL LOW (ref 13.0–17.0)
Hemoglobin: 9.9 g/dL — ABNORMAL LOW (ref 13.0–17.0)
Hemoglobin: 10.2 g/dL — ABNORMAL LOW (ref 13.0–17.0)
Hemoglobin: 10.9 g/dL — ABNORMAL LOW (ref 13.0–17.0)
Hemoglobin: 13.6 g/dL (ref 13.0–17.0)
POTASSIUM: 4.2 mmol/L (ref 3.5–5.1)
POTASSIUM: 3.9 mmol/L (ref 3.5–5.1)
POTASSIUM: 4.6 mmol/L (ref 3.5–5.1)
POTASSIUM: 4 mmol/L (ref 3.5–5.1)
POTASSIUM: 4.5 mmol/L (ref 3.5–5.1)
Potassium: 3.5 mmol/L (ref 3.5–5.1)
Potassium: 3.7 mmol/L (ref 3.5–5.1)
SODIUM: 141 mmol/L (ref 135–145)
SODIUM: 140 mmol/L (ref 135–145)
Sodium: 140 mmol/L (ref 135–145)
Sodium: 140 mmol/L (ref 135–145)
Sodium: 138 mmol/L (ref 135–145)
Sodium: 141 mmol/L (ref 135–145)
Sodium: 142 mmol/L (ref 135–145)
TCO2: 27 mmol/L (ref 0–100)
TCO2: 27 mmol/L (ref 0–100)
TCO2: 27 mmol/L (ref 0–100)
TCO2: 24 mmol/L (ref 0–100)
TCO2: 25 mmol/L (ref 0–100)
TCO2: 24 mmol/L (ref 0–100)
TCO2: 20 mmol/L (ref 0–100)

## 2015-11-22 LAB — MAGNESIUM: Magnesium: 3.1 mg/dL — ABNORMAL HIGH (ref 1.7–2.4)

## 2015-11-22 LAB — POCT I-STAT 3, ART BLOOD GAS (G3+)
Acid-Base Excess: 2 mmol/L (ref 0.0–2.0)
Acid-base deficit: 2 mmol/L (ref 0.0–2.0)
Acid-base deficit: 1 mmol/L (ref 0.0–2.0)
Acid-base deficit: 5 mmol/L — ABNORMAL HIGH (ref 0.0–2.0)
Acid-base deficit: 6 mmol/L — ABNORMAL HIGH (ref 0.0–2.0)
BICARBONATE: 27 meq/L — AB (ref 20.0–24.0)
BICARBONATE: 22.6 meq/L (ref 20.0–24.0)
Bicarbonate: 23.8 mEq/L (ref 20.0–24.0)
Bicarbonate: 19.8 mEq/L — ABNORMAL LOW (ref 20.0–24.0)
Bicarbonate: 18.5 mEq/L — ABNORMAL LOW (ref 20.0–24.0)
O2 SAT: 100 %
O2 SAT: 92 %
O2 SAT: 96 %
O2 Saturation: 100 %
O2 Saturation: 98 %
PCO2 ART: 35.1 mmHg (ref 35.0–45.0)
PCO2 ART: 37.9 mmHg (ref 35.0–45.0)
PCO2 ART: 35.6 mmHg (ref 35.0–45.0)
PCO2 ART: 34.1 mmHg — AB (ref 35.0–45.0)
PH ART: 7.409 (ref 7.350–7.450)
PH ART: 7.416 (ref 7.350–7.450)
PH ART: 7.402 (ref 7.350–7.450)
PH ART: 7.354 (ref 7.350–7.450)
PH ART: 7.344 — AB (ref 7.350–7.450)
PO2 ART: 61 mmHg — AB (ref 80.0–100.0)
PO2 ART: 109 mmHg — AB (ref 80.0–100.0)
Patient temperature: 36.1
Patient temperature: 37.2
Patient temperature: 37.3
TCO2: 28 mmol/L (ref 0–100)
TCO2: 24 mmol/L (ref 0–100)
TCO2: 25 mmol/L (ref 0–100)
TCO2: 21 mmol/L (ref 0–100)
TCO2: 19 mmol/L (ref 0–100)
pCO2 arterial: 42.7 mmHg (ref 35.0–45.0)
pO2, Arterial: 442 mmHg — ABNORMAL HIGH (ref 80.0–100.0)
pO2, Arterial: 295 mmHg — ABNORMAL HIGH (ref 80.0–100.0)
pO2, Arterial: 88 mmHg (ref 80.0–100.0)

## 2015-11-22 LAB — APTT: APTT: 27 s (ref 24–37)

## 2015-11-22 LAB — BASIC METABOLIC PANEL
Anion gap: 12 (ref 5–15)
BUN: 15 mg/dL (ref 6–20)
CO2: 25 mmol/L (ref 22–32)
Calcium: 9.6 mg/dL (ref 8.9–10.3)
Chloride: 103 mmol/L (ref 101–111)
Creatinine, Ser: 1.11 mg/dL (ref 0.61–1.24)
GFR calc Af Amer: >60 mL/min (ref >60)
GFR calc non Af Amer: >60 mL/min (ref >60)
Glucose, Bld: 131 mg/dL — ABNORMAL HIGH (ref 65–99)
Potassium: 4.1 mmol/L (ref 3.5–5.1)
Sodium: 140 mmol/L (ref 135–145)

## 2015-11-22 LAB — CREATININE, SERUM
Creatinine, Ser: 1 mg/dL (ref 0.61–1.24)
GFR calc Af Amer: >60 mL/min (ref >60)
GFR calc non Af Amer: >60 mL/min (ref >60)

## 2015-11-22 LAB — HEMOGLOBIN A1C
Hgb A1c MFr Bld: 7.6 % — ABNORMAL HIGH (ref 4.8–5.6)
Mean Plasma Glucose: 171 mg/dL

## 2015-11-22 LAB — CBC
HCT: 43.1 % (ref 39.0–52.0)
HCT: 30.2 % — ABNORMAL LOW (ref 39.0–52.0)
HEMATOCRIT: 30.1 % — AB (ref 39.0–52.0)
HEMOGLOBIN: 10 g/dL — AB (ref 13.0–17.0)
Hemoglobin: 14.1 g/dL (ref 13.0–17.0)
Hemoglobin: 9.8 g/dL — ABNORMAL LOW (ref 13.0–17.0)
MCH: 29.3 pg (ref 26.0–34.0)
MCH: 29.8 pg (ref 26.0–34.0)
MCH: 28.7 pg (ref 26.0–34.0)
MCHC: 32.7 g/dL (ref 30.0–36.0)
MCHC: 33.2 g/dL (ref 30.0–36.0)
MCHC: 32.5 g/dL (ref 30.0–36.0)
MCV: 89.4 fL (ref 78.0–100.0)
MCV: 89.6 fL (ref 78.0–100.0)
MCV: 88.3 fL (ref 78.0–100.0)
Platelets: 165 10*3/uL (ref 150–400)
Platelets: 71 10*3/uL — ABNORMAL LOW (ref 150–400)
Platelets: 90 10*3/uL — ABNORMAL LOW (ref 150–400)
RBC: 4.82 MIL/uL (ref 4.22–5.81)
RBC: 3.36 MIL/uL — ABNORMAL LOW (ref 4.22–5.81)
RBC: 3.42 MIL/uL — ABNORMAL LOW (ref 4.22–5.81)
RDW: 13.2 % (ref 11.5–15.5)
RDW: 13.3 % (ref 11.5–15.5)
RDW: 13.2 % (ref 11.5–15.5)
WBC: 5 10*3/uL (ref 4.0–10.5)
WBC: 6.2 10*3/uL (ref 4.0–10.5)
WBC: 6.7 10*3/uL (ref 4.0–10.5)

## 2015-11-22 LAB — PROTIME-INR
INR: 1.46 (ref 0.00–1.49)
Prothrombin Time: 17.8 seconds — ABNORMAL HIGH (ref 11.6–15.2)

## 2015-11-22 LAB — PLATELET COUNT: Platelets: 105 10*3/uL — ABNORMAL LOW (ref 150–400)

## 2015-11-22 LAB — HEMOGLOBIN AND HEMATOCRIT, BLOOD
HCT: 27.7 % — ABNORMAL LOW (ref 39.0–52.0)
Hemoglobin: 9.4 g/dL — ABNORMAL LOW (ref 13.0–17.0)

## 2015-11-22 LAB — MRSA PCR SCREENING: MRSA by PCR: NEGATIVE

## 2015-11-22 SURGERY — CORONARY ARTERY BYPASS GRAFTING (CABG)
Anesthesia: General | Site: Chest

## 2015-11-22 MED ORDER — PROTAMINE SULFATE 10 MG/ML IV SOLN
INTRAVENOUS | Status: AC
  Filled 2015-11-22: qty 25

## 2015-11-22 MED ORDER — SODIUM CHLORIDE 0.9 % IV SOLN: INTRAVENOUS | Status: DC

## 2015-11-22 MED ORDER — VANCOMYCIN HCL IN DEXTROSE 1-5 GM/200ML-% IV SOLN
1000.0000 mg | Freq: Once | INTRAVENOUS | Status: DC
  Filled 2015-11-22 (×2): qty 200

## 2015-11-22 MED ORDER — MAGNESIUM SULFATE 4 GM/100ML IV SOLN
4.0000 g | Freq: Once | INTRAVENOUS | Status: AC
  Administered 2015-11-22: 4 g via INTRAVENOUS
  Filled 2015-11-22: qty 100

## 2015-11-22 MED ORDER — PROTAMINE SULFATE 10 MG/ML IV SOLN
INTRAVENOUS | Status: AC
  Filled 2015-11-22: qty 5

## 2015-11-22 MED ORDER — FENTANYL CITRATE (PF) 250 MCG/5ML IJ SOLN
INTRAMUSCULAR | Status: AC
  Filled 2015-11-22: qty 5

## 2015-11-22 MED ORDER — SUCCINYLCHOLINE CHLORIDE 20 MG/ML IJ SOLN
INTRAMUSCULAR | Status: DC | PRN
  Administered 2015-11-22: 100 mg via INTRAVENOUS

## 2015-11-22 MED ORDER — EPHEDRINE SULFATE 50 MG/ML IJ SOLN
INTRAMUSCULAR | Status: AC
  Filled 2015-11-22: qty 1

## 2015-11-22 MED ORDER — MIDAZOLAM HCL 5 MG/5ML IJ SOLN
INTRAMUSCULAR | Status: DC | PRN
  Administered 2015-11-22: 2 mg via INTRAVENOUS
  Administered 2015-11-22: 5 mg via INTRAVENOUS
  Administered 2015-11-22: 3 mg via INTRAVENOUS
  Administered 2015-11-22: 2 mg via INTRAVENOUS

## 2015-11-22 MED ORDER — PANTOPRAZOLE SODIUM 40 MG PO TBEC
40.0000 mg | DELAYED_RELEASE_TABLET | Freq: Every day | ORAL | Status: DC
  Administered 2015-11-24 – 2015-11-27 (×4): 40 mg via ORAL
  Filled 2015-11-22 (×4): qty 1

## 2015-11-22 MED ORDER — LACTATED RINGERS IV SOLN
INTRAVENOUS | Status: DC | PRN
  Administered 2015-11-22: 07:00:00 via INTRAVENOUS

## 2015-11-22 MED ORDER — DOCUSATE SODIUM 100 MG PO CAPS
200.0000 mg | ORAL_CAPSULE | Freq: Every day | ORAL | Status: DC
  Administered 2015-11-23 – 2015-11-27 (×4): 200 mg via ORAL
  Filled 2015-11-22 (×4): qty 2

## 2015-11-22 MED ORDER — METOPROLOL TARTRATE 25 MG/10 ML ORAL SUSPENSION
12.5000 mg | Freq: Two times a day (BID) | ORAL | Status: DC
Start: 2015-11-22 — End: 2015-11-24

## 2015-11-22 MED ORDER — ACETAMINOPHEN 160 MG/5ML PO SOLN: 650.0000 mg | Freq: Once | ORAL | Status: AC

## 2015-11-22 MED ORDER — TRAMADOL HCL 50 MG PO TABS
50.0000 mg | ORAL_TABLET | ORAL | Status: DC | PRN
  Administered 2015-11-24 – 2015-11-26 (×4): 100 mg via ORAL
  Filled 2015-11-22 (×5): qty 2

## 2015-11-22 MED ORDER — PROPOFOL 10 MG/ML IV BOLUS
INTRAVENOUS | Status: DC | PRN
  Administered 2015-11-22: 30 mg via INTRAVENOUS

## 2015-11-22 MED ORDER — FENTANYL CITRATE (PF) 250 MCG/5ML IJ SOLN
INTRAMUSCULAR | Status: AC
  Filled 2015-11-22: qty 25

## 2015-11-22 MED ORDER — METOPROLOL TARTRATE 12.5 MG HALF TABLET
12.5000 mg | ORAL_TABLET | Freq: Two times a day (BID) | ORAL | Status: DC
  Administered 2015-11-23 – 2015-11-25 (×5): 12.5 mg via ORAL
  Filled 2015-11-22 (×5): qty 1

## 2015-11-22 MED ORDER — SUCCINYLCHOLINE CHLORIDE 20 MG/ML IJ SOLN
INTRAMUSCULAR | Status: AC
  Filled 2015-11-22: qty 1

## 2015-11-22 MED ORDER — FAMOTIDINE IN NACL 20-0.9 MG/50ML-% IV SOLN
20.0000 mg | Freq: Two times a day (BID) | INTRAVENOUS | Status: AC
  Administered 2015-11-22: 20 mg via INTRAVENOUS

## 2015-11-22 MED ORDER — ANTISEPTIC ORAL RINSE SOLUTION (CORINZ): 7.0000 mL | Freq: Four times a day (QID) | OROMUCOSAL | Status: DC

## 2015-11-22 MED ORDER — ACETAMINOPHEN 160 MG/5ML PO SOLN: 1000.0000 mg | Freq: Four times a day (QID) | ORAL | Status: DC

## 2015-11-22 MED ORDER — SODIUM CHLORIDE 0.9 % IV SOLN: Freq: Once | INTRAVENOUS | Status: DC

## 2015-11-22 MED ORDER — HEPARIN SODIUM (PORCINE) 1000 UNIT/ML IJ SOLN
INTRAMUSCULAR | Status: AC
  Filled 2015-11-22: qty 1

## 2015-11-22 MED ORDER — ASPIRIN EC 325 MG PO TBEC
325.0000 mg | DELAYED_RELEASE_TABLET | Freq: Every day | ORAL | Status: DC
  Administered 2015-11-23 – 2015-11-27 (×5): 325 mg via ORAL
  Filled 2015-11-22 (×5): qty 1

## 2015-11-22 MED ORDER — ROCURONIUM BROMIDE 50 MG/5ML IV SOLN
INTRAVENOUS | Status: AC
  Filled 2015-11-22: qty 1

## 2015-11-22 MED ORDER — OXYCODONE HCL 5 MG PO TABS
5.0000 mg | ORAL_TABLET | ORAL | Status: DC | PRN
  Administered 2015-11-23: 10 mg via ORAL
  Administered 2015-11-23: 5 mg via ORAL
  Administered 2015-11-23 – 2015-11-24 (×2): 10 mg via ORAL
  Administered 2015-11-24: 5 mg via ORAL
  Filled 2015-11-22: qty 1
  Filled 2015-11-22 (×2): qty 2
  Filled 2015-11-22: qty 1
  Filled 2015-11-22: qty 2

## 2015-11-22 MED ORDER — MIDAZOLAM HCL 2 MG/2ML IJ SOLN: 2.0000 mg | INTRAMUSCULAR | Status: DC | PRN

## 2015-11-22 MED ORDER — PHENYLEPHRINE 40 MCG/ML (10ML) SYRINGE FOR IV PUSH (FOR BLOOD PRESSURE SUPPORT)
PREFILLED_SYRINGE | INTRAVENOUS | Status: AC
  Filled 2015-11-22: qty 10

## 2015-11-22 MED ORDER — MORPHINE SULFATE (PF) 2 MG/ML IV SOLN
2.0000 mg | INTRAVENOUS | Status: DC | PRN
  Administered 2015-11-22: 4 mg via INTRAVENOUS
  Administered 2015-11-22: 2 mg via INTRAVENOUS
  Administered 2015-11-22: 4 mg via INTRAVENOUS
  Administered 2015-11-23: 2 mg via INTRAVENOUS
  Administered 2015-11-23: 4 mg via INTRAVENOUS
  Administered 2015-11-23 (×3): 2 mg via INTRAVENOUS
  Filled 2015-11-22 (×3): qty 2
  Filled 2015-11-22 (×3): qty 1
  Filled 2015-11-22: qty 2

## 2015-11-22 MED ORDER — 0.9 % SODIUM CHLORIDE (POUR BTL) OPTIME
TOPICAL | Status: DC | PRN
  Administered 2015-11-22: 1000 mL

## 2015-11-22 MED ORDER — BISACODYL 10 MG RE SUPP
10.0000 mg | Freq: Every day | RECTAL | Status: DC
  Administered 2015-11-24: 10 mg via RECTAL
  Filled 2015-11-22: qty 1

## 2015-11-22 MED ORDER — ARTIFICIAL TEARS OP OINT
TOPICAL_OINTMENT | OPHTHALMIC | Status: AC
  Filled 2015-11-22: qty 3.5

## 2015-11-22 MED ORDER — ARTIFICIAL TEARS OP OINT
TOPICAL_OINTMENT | OPHTHALMIC | Status: DC | PRN
  Administered 2015-11-22: 1 via OPHTHALMIC

## 2015-11-22 MED ORDER — SODIUM CHLORIDE 0.45 % IV SOLN: INTRAVENOUS | Status: DC | PRN

## 2015-11-22 MED ORDER — SODIUM CHLORIDE 0.9% FLUSH
3.0000 mL | Freq: Two times a day (BID) | INTRAVENOUS | Status: DC
  Administered 2015-11-23 – 2015-11-24 (×2): 10 mL via INTRAVENOUS
  Administered 2015-11-25: 3 mL via INTRAVENOUS
  Administered 2015-11-25: 10 mL via INTRAVENOUS
  Administered 2015-11-26: 3 mL via INTRAVENOUS
  Administered 2015-11-26: 10 mL via INTRAVENOUS

## 2015-11-22 MED ORDER — HEMOSTATIC AGENTS (NO CHARGE) OPTIME
TOPICAL | Status: DC | PRN
  Administered 2015-11-22: 1 via TOPICAL

## 2015-11-22 MED ORDER — MIDAZOLAM HCL 2 MG/2ML IJ SOLN
INTRAMUSCULAR | Status: AC
  Filled 2015-11-22: qty 2

## 2015-11-22 MED ORDER — LACTATED RINGERS IV SOLN: 500.0000 mL | Freq: Once | INTRAVENOUS | Status: DC | PRN

## 2015-11-22 MED ORDER — CETYLPYRIDINIUM CHLORIDE 0.05 % MT LIQD
7.0000 mL | Freq: Two times a day (BID) | OROMUCOSAL | Status: DC
  Administered 2015-11-23 – 2015-11-26 (×6): 7 mL via OROMUCOSAL

## 2015-11-22 MED ORDER — SODIUM CHLORIDE 0.9% FLUSH: 3.0000 mL | INTRAVENOUS | Status: DC | PRN

## 2015-11-22 MED ORDER — ESMOLOL HCL 100 MG/10ML IV SOLN
INTRAVENOUS | Status: AC
  Filled 2015-11-22: qty 10

## 2015-11-22 MED ORDER — GELATIN ABSORBABLE MT POWD
OROMUCOSAL | Status: DC | PRN
  Administered 2015-11-22: 4 mL via TOPICAL

## 2015-11-22 MED ORDER — NITROGLYCERIN IN D5W 200-5 MCG/ML-% IV SOLN: 0.0000 ug/min | INTRAVENOUS | Status: DC

## 2015-11-22 MED ORDER — ALBUMIN HUMAN 5 % IV SOLN
INTRAVENOUS | Status: DC | PRN
  Administered 2015-11-22: 13:00:00 via INTRAVENOUS

## 2015-11-22 MED ORDER — DEXTROSE 5 % IV SOLN
1.5000 g | Freq: Two times a day (BID) | INTRAVENOUS | Status: AC
  Administered 2015-11-22 – 2015-11-24 (×4): 1.5 g via INTRAVENOUS
  Filled 2015-11-22 (×4): qty 1.5

## 2015-11-22 MED ORDER — HEPARIN SODIUM (PORCINE) 1000 UNIT/ML IJ SOLN
INTRAMUSCULAR | Status: DC | PRN
  Administered 2015-11-22: 2000 [IU] via INTRAVENOUS
  Administered 2015-11-22: 30000 [IU] via INTRAVENOUS
  Administered 2015-11-22: 3000 [IU] via INTRAVENOUS

## 2015-11-22 MED ORDER — LACTATED RINGERS IV SOLN
INTRAVENOUS | Status: DC
  Administered 2015-11-22: 20 mL/h via INTRAVENOUS

## 2015-11-22 MED ORDER — PROTAMINE SULFATE 10 MG/ML IV SOLN
INTRAVENOUS | Status: DC | PRN
  Administered 2015-11-22: 50 mg via INTRAVENOUS
  Administered 2015-11-22: 300 mg via INTRAVENOUS

## 2015-11-22 MED ORDER — PHENYLEPHRINE HCL 10 MG/ML IJ SOLN
10.0000 mg | INTRAVENOUS | Status: DC | PRN
  Administered 2015-11-22: 20 ug/min via INTRAVENOUS

## 2015-11-22 MED ORDER — DEXTROSE 5 % IV SOLN
20.0000 mg | INTRAVENOUS | Status: DC | PRN
  Administered 2015-11-22: 10 ug/min via INTRAVENOUS

## 2015-11-22 MED ORDER — BISACODYL 5 MG PO TBEC
10.0000 mg | DELAYED_RELEASE_TABLET | Freq: Every day | ORAL | Status: DC
  Administered 2015-11-23 – 2015-11-27 (×3): 10 mg via ORAL
  Filled 2015-11-22 (×4): qty 2

## 2015-11-22 MED ORDER — INSULIN REGULAR BOLUS VIA INFUSION
0.0000 [IU] | Freq: Three times a day (TID) | INTRAVENOUS | Status: DC
  Administered 2015-11-23 (×2): 2 [IU] via INTRAVENOUS
  Filled 2015-11-22: qty 10

## 2015-11-22 MED ORDER — LIDOCAINE HCL (CARDIAC) 20 MG/ML IV SOLN
INTRAVENOUS | Status: DC | PRN
  Administered 2015-11-22: 100 mg via INTRAVENOUS

## 2015-11-22 MED ORDER — ROCURONIUM BROMIDE 100 MG/10ML IV SOLN
INTRAVENOUS | Status: DC | PRN
  Administered 2015-11-22 (×6): 50 mg via INTRAVENOUS

## 2015-11-22 MED ORDER — FENTANYL CITRATE (PF) 100 MCG/2ML IJ SOLN
INTRAMUSCULAR | Status: DC | PRN
  Administered 2015-11-22: 250 ug via INTRAVENOUS
  Administered 2015-11-22: 50 ug via INTRAVENOUS
  Administered 2015-11-22: 150 ug via INTRAVENOUS
  Administered 2015-11-22: 100 ug via INTRAVENOUS
  Administered 2015-11-22: 200 ug via INTRAVENOUS
  Administered 2015-11-22 (×5): 150 ug via INTRAVENOUS

## 2015-11-22 MED ORDER — PROPOFOL 10 MG/ML IV BOLUS
INTRAVENOUS | Status: AC
Start: 2015-11-22 — End: 2015-11-22
  Filled 2015-11-22: qty 20

## 2015-11-22 MED ORDER — ACETAMINOPHEN 500 MG PO TABS
1000.0000 mg | ORAL_TABLET | Freq: Four times a day (QID) | ORAL | Status: DC
  Administered 2015-11-23 – 2015-11-27 (×15): 1000 mg via ORAL
  Filled 2015-11-22 (×15): qty 2

## 2015-11-22 MED ORDER — LACTATED RINGERS IV SOLN
INTRAVENOUS | Status: DC | PRN
  Administered 2015-11-22 (×2): via INTRAVENOUS

## 2015-11-22 MED ORDER — 0.9 % SODIUM CHLORIDE (POUR BTL) OPTIME
TOPICAL | Status: DC | PRN
  Administered 2015-11-22: 5000 mL

## 2015-11-22 MED ORDER — ONDANSETRON HCL 4 MG/2ML IJ SOLN: 4.0000 mg | Freq: Four times a day (QID) | INTRAMUSCULAR | Status: DC | PRN

## 2015-11-22 MED ORDER — CHLORHEXIDINE GLUCONATE 0.12 % MT SOLN: 15.0000 mL | OROMUCOSAL | Status: DC

## 2015-11-22 MED ORDER — LACTATED RINGERS IV SOLN
INTRAVENOUS | Status: DC
Start: 2015-11-22 — End: 2015-11-24

## 2015-11-22 MED ORDER — KETOROLAC TROMETHAMINE 15 MG/ML IJ SOLN
15.0000 mg | Freq: Four times a day (QID) | INTRAMUSCULAR | Status: AC
  Administered 2015-11-22 – 2015-11-24 (×6): 15 mg via INTRAVENOUS
  Filled 2015-11-22 (×6): qty 1

## 2015-11-22 MED ORDER — POTASSIUM CHLORIDE 10 MEQ/50ML IV SOLN
10.0000 meq | INTRAVENOUS | Status: AC
  Administered 2015-11-22 (×3): 10 meq via INTRAVENOUS

## 2015-11-22 MED ORDER — METOPROLOL TARTRATE 1 MG/ML IV SOLN: 2.5000 mg | INTRAVENOUS | Status: DC | PRN

## 2015-11-22 MED ORDER — MIDAZOLAM HCL 10 MG/2ML IJ SOLN
INTRAMUSCULAR | Status: AC
Start: 2015-11-22 — End: 2015-11-22
  Filled 2015-11-22: qty 2

## 2015-11-22 MED ORDER — MORPHINE SULFATE (PF) 2 MG/ML IV SOLN: 1.0000 mg | INTRAVENOUS | Status: AC | PRN

## 2015-11-22 MED ORDER — LIDOCAINE HCL (CARDIAC) 20 MG/ML IV SOLN
INTRAVENOUS | Status: AC
  Filled 2015-11-22: qty 5

## 2015-11-22 MED ORDER — PHENYLEPHRINE HCL 10 MG/ML IJ SOLN
0.0000 ug/min | INTRAVENOUS | Status: DC
  Filled 2015-11-22 (×2): qty 2

## 2015-11-22 MED ORDER — ASPIRIN 81 MG PO CHEW: 324.0000 mg | CHEWABLE_TABLET | Freq: Every day | ORAL | Status: DC

## 2015-11-22 MED ORDER — ROCURONIUM BROMIDE 50 MG/5ML IV SOLN
INTRAVENOUS | Status: AC
  Filled 2015-11-22: qty 4

## 2015-11-22 MED ORDER — CHLORHEXIDINE GLUCONATE 0.12% ORAL RINSE (MEDLINE KIT)
15.0000 mL | Freq: Two times a day (BID) | OROMUCOSAL | Status: DC
  Administered 2015-11-22: 15 mL via OROMUCOSAL

## 2015-11-22 MED ORDER — DEXMEDETOMIDINE HCL IN NACL 200 MCG/50ML IV SOLN
INTRAVENOUS | Status: AC
  Filled 2015-11-22: qty 50

## 2015-11-22 MED ORDER — ACETAMINOPHEN 650 MG RE SUPP
650.0000 mg | Freq: Once | RECTAL | Status: AC
  Administered 2015-11-22: 650 mg via RECTAL

## 2015-11-22 MED ORDER — PHENYLEPHRINE HCL 10 MG/ML IJ SOLN
INTRAMUSCULAR | Status: DC | PRN
  Administered 2015-11-22: 80 ug via INTRAVENOUS
  Administered 2015-11-22: 40 ug via INTRAVENOUS

## 2015-11-22 MED ORDER — DEXMEDETOMIDINE HCL IN NACL 200 MCG/50ML IV SOLN
0.0000 ug/kg/h | INTRAVENOUS | Status: DC
  Administered 2015-11-22: 0.3 ug/kg/h via INTRAVENOUS
  Filled 2015-11-22: qty 50

## 2015-11-22 MED ORDER — DOPAMINE-DEXTROSE 3.2-5 MG/ML-% IV SOLN: 0.0000 ug/kg/min | INTRAVENOUS | Status: DC

## 2015-11-22 MED ORDER — ALBUMIN HUMAN 5 % IV SOLN
250.0000 mL | INTRAVENOUS | Status: AC | PRN
Start: 2015-11-22 — End: 2015-11-23
  Administered 2015-11-22 (×2): 250 mL via INTRAVENOUS

## 2015-11-22 MED ORDER — SODIUM CHLORIDE 0.9 % IV SOLN
20.0000 ug | Freq: Once | INTRAVENOUS | Status: AC
  Administered 2015-11-22: 20 ug via INTRAVENOUS
  Filled 2015-11-22: qty 5

## 2015-11-22 MED ORDER — SODIUM CHLORIDE 0.9 % IV SOLN: 250.0000 mL | INTRAVENOUS | Status: DC

## 2015-11-22 MED ORDER — SODIUM CHLORIDE 0.9 % IV SOLN
INTRAVENOUS | Status: DC
  Administered 2015-11-23: 01:00:00 via INTRAVENOUS
  Filled 2015-11-22: qty 2.5

## 2015-11-22 MED ORDER — ESMOLOL HCL 100 MG/10ML IV SOLN
INTRAVENOUS | Status: DC | PRN
  Administered 2015-11-22: 30 mg via INTRAVENOUS

## 2015-11-22 MED ORDER — SODIUM CHLORIDE 0.9 % IV SOLN
INTRAVENOUS | Status: DC | PRN
  Administered 2015-11-22: 13:00:00 via INTRAVENOUS

## 2015-11-22 MED ORDER — ACETAMINOPHEN 500 MG PO TABS: 1000.0000 mg | ORAL_TABLET | Freq: Four times a day (QID) | ORAL | Status: DC

## 2015-11-22 MED ORDER — HEMOSTATIC AGENTS (NO CHARGE) OPTIME
TOPICAL | Status: DC | PRN
  Administered 2015-11-22 (×2): 1 via TOPICAL

## 2015-11-22 MED FILL — Mannitol IV Soln 20%: INTRAVENOUS | Qty: 500 | Status: AC

## 2015-11-22 MED FILL — Potassium Chloride Inj 2 mEq/ML: INTRAVENOUS | Qty: 40 | Status: AC

## 2015-11-22 MED FILL — Heparin Sodium (Porcine) Inj 1000 Unit/ML: INTRAMUSCULAR | Qty: 10 | Status: AC

## 2015-11-22 MED FILL — Sodium Chloride IV Soln 0.9%: INTRAVENOUS | Qty: 2000 | Status: AC

## 2015-11-22 MED FILL — Sodium Bicarbonate IV Soln 8.4%: INTRAVENOUS | Qty: 50 | Status: AC

## 2015-11-22 MED FILL — Heparin Sodium (Porcine) Inj 1000 Unit/ML: INTRAMUSCULAR | Qty: 30 | Status: AC

## 2015-11-22 MED FILL — Electrolyte-R (PH 7.4) Solution: INTRAVENOUS | Qty: 4000 | Status: AC

## 2015-11-22 MED FILL — Lidocaine HCl IV Inj 20 MG/ML: INTRAVENOUS | Qty: 5 | Status: AC

## 2015-11-22 MED FILL — Magnesium Sulfate Inj 50%: INTRAMUSCULAR | Qty: 10 | Status: AC

## 2015-11-22 SURGICAL SUPPLY — 102 items
ADAPTER CARDIO PERF ANTE/RETRO (ADAPTER) ×4 IMPLANT
BAG DECANTER FOR FLEXI CONT (MISCELLANEOUS) ×4 IMPLANT
BANDAGE ACE 4X5 VEL STRL LF (GAUZE/BANDAGES/DRESSINGS) ×4 IMPLANT
BANDAGE ACE 6X5 VEL STRL LF (GAUZE/BANDAGES/DRESSINGS) ×4 IMPLANT
BANDAGE ELASTIC 4 VELCRO ST LF (GAUZE/BANDAGES/DRESSINGS) ×4 IMPLANT
BANDAGE ELASTIC 6 VELCRO ST LF (GAUZE/BANDAGES/DRESSINGS) ×4 IMPLANT
BASKET HEART  (ORDER IN 25'S) (MISCELLANEOUS) ×1
BASKET HEART (ORDER IN 25'S) (MISCELLANEOUS) ×1
BASKET HEART (ORDER IN 25S) (MISCELLANEOUS) ×2 IMPLANT
BLADE STERNUM SYSTEM 6 (BLADE) ×4 IMPLANT
BLADE SURG 12 STRL SS (BLADE) ×4 IMPLANT
BLADE SURG ROTATE 9660 (MISCELLANEOUS) IMPLANT
BNDG GAUZE ELAST 4 BULKY (GAUZE/BANDAGES/DRESSINGS) ×4 IMPLANT
CANISTER SUCTION 2500CC (MISCELLANEOUS) ×4 IMPLANT
CANNULA GUNDRY RCSP 15FR (MISCELLANEOUS) ×4 IMPLANT
CATH CPB KIT VANTRIGT (MISCELLANEOUS) ×4 IMPLANT
CATH ROBINSON RED A/P 18FR (CATHETERS) ×12 IMPLANT
CATH THORACIC 36FR RT ANG (CATHETERS) ×4 IMPLANT
CLIP FOGARTY SPRING 6M (CLIP) ×4 IMPLANT
CLIP TI WIDE RED SMALL 24 (CLIP) ×4 IMPLANT
COVER SURGICAL LIGHT HANDLE (MISCELLANEOUS) ×4 IMPLANT
CRADLE DONUT ADULT HEAD (MISCELLANEOUS) ×4 IMPLANT
DRAIN CHANNEL 32F RND 10.7 FF (WOUND CARE) ×4 IMPLANT
DRAPE CARDIOVASCULAR INCISE (DRAPES) ×2
DRAPE SLUSH/WARMER DISC (DRAPES) ×4 IMPLANT
DRAPE SRG 135X102X78XABS (DRAPES) ×2 IMPLANT
DRSG AQUACEL AG ADV 3.5X14 (GAUZE/BANDAGES/DRESSINGS) ×4 IMPLANT
ELECT BLADE 4.0 EZ CLEAN MEGAD (MISCELLANEOUS) ×4
ELECT BLADE 6.5 EXT (BLADE) ×4 IMPLANT
ELECT CAUTERY BLADE 6.4 (BLADE) ×4 IMPLANT
ELECT REM PT RETURN 9FT ADLT (ELECTROSURGICAL) ×8
ELECTRODE BLDE 4.0 EZ CLN MEGD (MISCELLANEOUS) ×2 IMPLANT
ELECTRODE REM PT RTRN 9FT ADLT (ELECTROSURGICAL) ×4 IMPLANT
GAUZE SPONGE 4X4 12PLY STRL (GAUZE/BANDAGES/DRESSINGS) ×8 IMPLANT
GLOVE BIO SURGEON STRL SZ 6 (GLOVE) ×12 IMPLANT
GLOVE BIO SURGEON STRL SZ 6.5 (GLOVE) ×15 IMPLANT
GLOVE BIO SURGEON STRL SZ7.5 (GLOVE) ×20 IMPLANT
GLOVE BIO SURGEONS STRL SZ 6.5 (GLOVE) ×5
GLOVE BIOGEL PI IND STRL 6 (GLOVE) ×4 IMPLANT
GLOVE BIOGEL PI IND STRL 6.5 (GLOVE) ×14 IMPLANT
GLOVE BIOGEL PI IND STRL 8 (GLOVE) ×2 IMPLANT
GLOVE BIOGEL PI INDICATOR 6 (GLOVE) ×4
GLOVE BIOGEL PI INDICATOR 6.5 (GLOVE) ×14
GLOVE BIOGEL PI INDICATOR 8 (GLOVE) ×2
GOWN STRL REUS W/ TWL LRG LVL3 (GOWN DISPOSABLE) ×16 IMPLANT
GOWN STRL REUS W/TWL LRG LVL3 (GOWN DISPOSABLE) ×16
HEMOSTAT POWDER SURGIFOAM 1G (HEMOSTASIS) ×12 IMPLANT
HEMOSTAT SURGICEL 2X14 (HEMOSTASIS) ×4 IMPLANT
INSERT FOGARTY XLG (MISCELLANEOUS) IMPLANT
KIT BASIN OR (CUSTOM PROCEDURE TRAY) ×4 IMPLANT
KIT ROOM TURNOVER OR (KITS) ×4 IMPLANT
KIT SUCTION CATH 14FR (SUCTIONS) ×4 IMPLANT
KIT VASOVIEW W/TROCAR VH 2000 (KITS) ×4 IMPLANT
LEAD PACING MYOCARDI (MISCELLANEOUS) ×4 IMPLANT
MARKER GRAFT CORONARY BYPASS (MISCELLANEOUS) ×12 IMPLANT
NS IRRIG 1000ML POUR BTL (IV SOLUTION) ×24 IMPLANT
PACK OPEN HEART (CUSTOM PROCEDURE TRAY) ×4 IMPLANT
PAD ARMBOARD 7.5X6 YLW CONV (MISCELLANEOUS) ×12 IMPLANT
PAD ELECT DEFIB RADIOL ZOLL (MISCELLANEOUS) ×4 IMPLANT
PENCIL BUTTON HOLSTER BLD 10FT (ELECTRODE) ×4 IMPLANT
PUNCH AORTIC ROTATE 4.0MM (MISCELLANEOUS) IMPLANT
PUNCH AORTIC ROTATE 4.5MM 8IN (MISCELLANEOUS) ×4 IMPLANT
PUNCH AORTIC ROTATE 5MM 8IN (MISCELLANEOUS) IMPLANT
SET CARDIOPLEGIA MPS 5001102 (MISCELLANEOUS) ×4 IMPLANT
SPONGE GAUZE 4X4 12PLY STER LF (GAUZE/BANDAGES/DRESSINGS) ×8 IMPLANT
SPONGE LAP 18X18 X RAY DECT (DISPOSABLE) ×4 IMPLANT
SPONGE LAP 4X18 X RAY DECT (DISPOSABLE) ×4 IMPLANT
SURGIFLO W/THROMBIN 8M KIT (HEMOSTASIS) ×12 IMPLANT
SUT BONE WAX W31G (SUTURE) ×4 IMPLANT
SUT MNCRL AB 4-0 PS2 18 (SUTURE) IMPLANT
SUT PROLENE 3 0 SH DA (SUTURE) ×12 IMPLANT
SUT PROLENE 3 0 SH1 36 (SUTURE) IMPLANT
SUT PROLENE 4 0 RB 1 (SUTURE) ×4
SUT PROLENE 4 0 SH DA (SUTURE) ×4 IMPLANT
SUT PROLENE 4-0 RB1 .5 CRCL 36 (SUTURE) ×4 IMPLANT
SUT PROLENE 5 0 C 1 36 (SUTURE) IMPLANT
SUT PROLENE 6 0 C 1 30 (SUTURE) ×4 IMPLANT
SUT PROLENE 6 0 CC (SUTURE) ×16 IMPLANT
SUT PROLENE 8 0 BV175 6 (SUTURE) ×4 IMPLANT
SUT PROLENE BLUE 7 0 (SUTURE) ×8 IMPLANT
SUT SILK  1 MH (SUTURE)
SUT SILK 1 MH (SUTURE) IMPLANT
SUT SILK 2 0 SH CR/8 (SUTURE) ×4 IMPLANT
SUT SILK 3 0 SH CR/8 (SUTURE) IMPLANT
SUT STEEL 6MS V (SUTURE) ×8 IMPLANT
SUT STEEL SZ 6 DBL 3X14 BALL (SUTURE) ×8 IMPLANT
SUT VIC AB 1 CTX 36 (SUTURE) ×4
SUT VIC AB 1 CTX36XBRD ANBCTR (SUTURE) ×4 IMPLANT
SUT VIC AB 2-0 CT1 27 (SUTURE) ×4
SUT VIC AB 2-0 CT1 TAPERPNT 27 (SUTURE) ×4 IMPLANT
SUT VIC AB 2-0 CTX 27 (SUTURE) IMPLANT
SUT VIC AB 3-0 X1 27 (SUTURE) ×8 IMPLANT
SUTURE E-PAK OPEN HEART (SUTURE) ×4 IMPLANT
SYSTEM SAHARA CHEST DRAIN ATS (WOUND CARE) ×4 IMPLANT
TAPE CLOTH SURG 4X10 WHT LF (GAUZE/BANDAGES/DRESSINGS) ×4 IMPLANT
TAPE PAPER 2X10 WHT MICROPORE (GAUZE/BANDAGES/DRESSINGS) ×4 IMPLANT
TOWEL OR 17X24 6PK STRL BLUE (TOWEL DISPOSABLE) IMPLANT
TOWEL OR 17X26 10 PK STRL BLUE (TOWEL DISPOSABLE) ×8 IMPLANT
TRAY FOLEY IC TEMP SENS 16FR (CATHETERS) ×4 IMPLANT
TUBING INSUFFLATION (TUBING) ×4 IMPLANT
UNDERPAD 30X30 INCONTINENT (UNDERPADS AND DIAPERS) ×4 IMPLANT
WATER STERILE IRR 1000ML POUR (IV SOLUTION) ×8 IMPLANT

## 2015-11-22 NOTE — Procedures (Signed)
Extubation Procedure Note  Patient Details:   Name: Naheem Tenerelli Ivanoff DOB: Feb 23, 1951 MRN: [id number]   Airway Documentation:  Pre-Extubation- Pt completed Rapid Wean w/o complication. Followed all commands, ABG normal. NIF: -30, VC: 1.2L. Cuff leak present.  Post-Extubation- Pt on 2L [place]. No Stridor. Pt clearly speaks name/location. Clear BBS. Strong cough. IS= 1060mls. RN at bedside throughout.   Evaluation  O2 sats: stable throughout Complications: No apparent complications Patient did tolerate procedure well. Bilateral Breath Sounds: Clear, Diminished Suctioning: Airway Yes  Sharen Hint 11/22/2015, 8:26 PM

## 2015-11-22 NOTE — Progress Notes (Signed)
The patient was examined and preop studies reviewed. There has been no change from the prior exam and the patient is ready for surgery.   Plan CABG on Jerome Vaughn for severe CAD

## 2015-11-22 NOTE — Progress Notes (Signed)
Patient ID: Jerome Vaughn, male   DOB: March 01, 1951, 65 y.o.   MRN: [id number]   SICU Evening Rounds:   Hemodynamically stable  CI = 2.8 on no drips  Weaning on vent.  Urine output good  CT output low  CBC    Component Value Date/Time   WBC 6.2 11/22/2015 1420   RBC 3.36* 11/22/2015 1420   HGB 10.9* 11/22/2015 1424   HCT 32.0* 11/22/2015 1424   PLT 71* 11/22/2015 1420   MCV 89.6 11/22/2015 1420   MCH 29.8 11/22/2015 1420   MCHC 33.2 11/22/2015 1420   RDW 13.3 11/22/2015 1420   LYMPHSABS 1.7 11/14/2015 1135   MONOABS 0.5 11/14/2015 1135   EOSABS 0.1 11/14/2015 1135   BASOSABS 0.0 11/14/2015 1135     BMET    Component Value Date/Time   NA 142 11/22/2015 1424   K 3.7 11/22/2015 1424   CL 103 11/22/2015 1424   CO2 25 11/22/2015 0500   GLUCOSE 116* 11/22/2015 1424   BUN 13 11/22/2015 1424   CREATININE 0.80 11/22/2015 1424   CREATININE 1.19 11/14/2015 1135   CALCIUM 9.6 11/22/2015 0500   GFRNONAA >60 11/22/2015 0500   GFRAA >60 11/22/2015 0500     A/P:  Stable postop course. Continue current plans

## 2015-11-22 NOTE — Anesthesia Preprocedure Evaluation (Addendum)
Anesthesia Evaluation  Patient identified by MRN, date of birth, ID band Patient awake    Reviewed: Allergy & Precautions, NPO status , Patient's Chart, lab work & pertinent test results, reviewed documented beta blocker date and time   Airway Mallampati: II  TM Distance: >3 FB Neck ROM: Full    Dental  (+) Poor Dentition, Chipped, Missing, Dental Advisory Given,    Pulmonary former smoker,    breath sounds clear to auscultation- rhonchi       Cardiovascular hypertension, Pt. on medications + CAD   Rhythm:Regular Rate:Normal     Neuro/Psych negative neurological ROS     GI/Hepatic negative GI ROS, Neg liver ROS,   Endo/Other  diabetes, Type 2, Oral Hypoglycemic Agents  Renal/GU negative Renal ROS     Musculoskeletal negative musculoskeletal ROS (+)   Abdominal   Peds  Hematology negative hematology ROS (+)   Anesthesia Other Findings   Reproductive/Obstetrics                          Lab Results  Component Value Date   WBC 5.0 11/22/2015   HGB 14.1 11/22/2015   HCT 43.1 11/22/2015   MCV 89.4 11/22/2015   PLT 165 11/22/2015   Lab Results  Component Value Date   CREATININE 1.11 11/22/2015   BUN 15 11/22/2015   NA 140 11/22/2015   K 4.1 11/22/2015   CL 103 11/22/2015   CO2 25 11/22/2015    Anesthesia Physical Anesthesia Plan  ASA: IV  Anesthesia Plan: General   Post-op Pain Management:    Induction: Intravenous  Airway Management Planned: Oral ETT  Additional Equipment: Arterial line, PA Cath, CVP, Ultrasound Guidance Line Placement and 3D TEE  Intra-op Plan:   Post-operative Plan: Post-operative intubation/ventilation  Informed Consent: I have reviewed the patients History and Physical, chart, labs and discussed the procedure including the risks, benefits and alternatives for the proposed anesthesia with the patient or authorized representative who has indicated  his/her understanding and acceptance.   Dental advisory given  Plan Discussed with: CRNA  Anesthesia Plan Comments:        Anesthesia Quick Evaluation

## 2015-11-22 NOTE — Op Note (Signed)
NAMEHADRIEL, KOLLER NO.:  [id number]  MEDICAL RECORD NO.:  [id number]  LOCATION:  2S02C                        FACILITY:  [facility]  PHYSICIAN:  [name], M.D.  DATE OF BIRTH:  11/05/1950  DATE OF PROCEDURE: DATE OF DISCHARGE:                              OPERATIVE REPORT   OPERATION: 1. Coronary artery bypass grafting x5 (left internal mammary artery to     LAD, saphenous vein graft to diagonal, sequential saphenous vein     graft to OM1 and distal circumflex, saphenous vein graft to     posterior descending). 2. Endoscopic harvest of right and left leg greater saphenous vein.  SURGEON:  [name], M.D.  ASSISTANT:  Jadene Pierini, PA-C.  ANESTHESIA:  General by Dr. Rodman Comp.  PREOPERATIVE DIAGNOSIS:  Unstable angina, positive stress test, critical left main stenosis.  POSTOPERATIVE DIAGNOSIS:  Unstable angina, positive stress test, critical left main stenosis.  INDICATIONS:  The patient is a 65 year old African American male who was admitted to the hospital after outpatient catheterization demonstrated a critical left main stenosis three-vessel coronary disease with recent history of a strongly positive stress test.  Cardiac surgical evaluation was requested based on his coronary anatomy and symptoms.  LV function was fairly well preserved with some inferior wall hypokinesia.  I discussed the results of the cardiac catheterization with the patient and his family and reviewed the expected benefits of coronary artery bypass surgery for treatment of his severe coronary artery disease.  We discussed the details of surgery including the use of general anesthesia and cardiopulmonary bypass, the location of the surgical incisions, and the expected postoperative hospital recovery.  I discussed with him the risks of CABG to him including the risks of stroke, bleeding, blood transfusion requirement, MI, postoperative infection, postoperative  lung problems including pleural effusion, and death.  After reviewing these issues, he demonstrated his understanding and agreed to proceed with surgery under what I felt was an informed consent.  OPERATIVE FINDINGS: 1. Adequate conduit in the right leg, small conduit in the left leg,     vein harvested from both legs. 2. Severe calcified coronary disease with difficult targets. 3. Preserved LV function after separation from cardiopulmonary bypass     by TEE.  OPERATIVE PROCEDURE:  The patient was brought to the operating room and placed supine on the operating table.  General anesthesia was induced under invasive hemodynamic monitoring.  The chest, abdomen, and legs were prepped with Betadine and draped as a sterile field.  A transesophageal echo probe was placed by the anesthesiologist.  A proper time-out was performed.  A sternal incision was made as the saphenous vein was harvested endoscopically from both legs.  The left internal mammary artery was harvested as a pedicle graft from its origin at the subclavian vessels.  It was a good vessel, 1.5 mm with good flow.  The sternal retractor was placed.  The pericardium was opened and suspended.  The aorta was inspected and palpated.  It had mild plaque or calcium.  Pursestrings placed in the ascending aorta and right atrium, and heparin was administered.  When the ACT was documented as  being therapeutic, the patient was cannulated and placed on cardiopulmonary bypass.  The coronaries were identified for grafting.  The LAD, diagonal, OM1, distal circumflex, and posterior descending were found to be adequate targets, although there was heavy calcification in the vessels.  The mammary artery and vein grafts were prepared for the distal anastomoses, and cardioplegia cannulas were placed for both antegrade and retrograde cold blood cardioplegia.  The patient was cooled to 32 degrees.  The aortic crossclamp was applied.  One liter  of cold blood cardioplegia was delivered in split doses between the antegrade aortic and retrograde coronary sinus catheters.  There was good cardioplegic arrest, and the septal temperature dropped less than 12 degrees.  Cardioplegia was delivered every 20 minutes.  The distal coronary anastomoses were performed.  First distal anastomosis was the posterior descending branch of the right.  This was totally occluded proximally.  It was heavily calcified.  The probe passed distally toward the apex.  A reverse saphenous vein was sewn end- to-side with running 7-0 Prolene with good flow through the graft. Cardioplegia was redosed.  The second and third distal anastomoses consisted a sequential vein graft to the OM1 and distal circumflex.  The OM1 was intramyocardial and was a 1.7-mm vessel.  The side-to-side anastomosis with the vein was sewn with a running 7-0 Prolene.  The continuation of the vein was then sewn end-to-side to the distal circumflex which was a smaller 1.2-mm vessel with heavy calcification but an adequate target.  After completion of the sequential grafts, the vein grafts were perfused with cardioplegia.  The fourth distal anastomosis was from the diagonal branch to the LAD. This was a 1.4-mm vessel with proximal 90% ostial stenosis, heavily calcified.  A reverse saphenous vein of the small caliber was sewn end- to-side with running 8-0 Prolene.  There was good flow through the graft.  Cardioplegia was redosed.  The fifth distal anastomosis was placed to the distal third of the LAD. The left IMA pedicle was brought through an opening, and the left lateral pericardium was brought down onto the LAD and sewn end-to-side with a running 8-0 Prolene.  There was good flow through the anastomosis after briefly releasing the pedicle bulldog on the mammary artery.  The bulldog was reapplied, and the pedicle was secured to the epicardium with 6-0 Prolene.  Cardioplegia was  redosed.  The crossclamp was still in place.  Three proximal vein anastomoses were performed on the ascending aorta using a 4.0 mm punch running 6-0 Prolene.  Prior to tying down the final proximal anastomosis, air was vented from the coronaries with a dose of retrograde warm blood cardioplegia.  The cross-clamp was removed.  The heart resumed a spontaneous rhythm.  The vein grafts were de-aired and opened, and each had good flow.  Hemostasis was documented at the proximal distal anastomoses.  The patient was rewarmed and reperfused. Temporary pacing wires were applied.  The lungs were expanded.  The ventilator was resumed.  The patient was then weaned from cardiopulmonary bypass without inotropes.  Hemodynamics were stable. Echo showed good LV function.  Protamine was administered without adverse reaction.  The cannulas were removed.  Protamine was administered without adverse effect to reverse the heparin.  There still was diffuse coagulopathy.  The patient was given FFP with some improved coagulation function as well as an extra dose of protamine.  Anterior and left pleural chest tubes were placed and brought through separate incisions.  The superior mediastinum and aorta were covered by  the pericardial fat.  The sternum was closed with interrupted steel wire.  The pectoralis fascia was closed with a running #1 Vicryl.  The subcutaneous and skin layers were closed in running Vicryl, and sterile dressings were applied.  Total cardiopulmonary bypass time was 132 minutes.     [name], M.D.     PV/MEDQ  D:  11/22/2015  T:  11/22/2015  Job:  [id number]  cc:   [name], M.D.

## 2015-11-22 NOTE — Progress Notes (Signed)
RT NOTE:  Rapid Wean started 

## 2015-11-22 NOTE — Progress Notes (Signed)
Echocardiogram Echocardiogram Transesophageal has been performed.  Joelene Millin 11/22/2015, 9:13 AM

## 2015-11-22 NOTE — Progress Notes (Signed)
Utilization review completed. Mehr Depaoli, RN, BSN. 

## 2015-11-22 NOTE — Brief Op Note (Signed)
11/21/2015 - 11/22/2015  12:14 PM      [address]       ,[place] [address]             [phone]     11/21/2015 - 11/22/2015  12:15 PM  PATIENT:  Jerome Vaughn  65 y.o. male  PRE-OPERATIVE DIAGNOSIS:  Left main stenosis, CAD  POST-OPERATIVE DIAGNOSIS:  Left main stenosis, CAD  PROCEDURE:  Procedure(s): CORONARY ARTERY BYPASS GRAFTING (CABG) times using the left internal mammary, right greater saphenous vein EVH, and left thigh greater saphenous vein EVH. (LIMA-LAD; SEQ SVG-OM-DIST CX; SVG-PD; SVG-DIAG) TRANSESOPHAGEAL ECHOCARDIOGRAM (TEE)  SURGEON:  Surgeon(s): [name], MD  PHYSICIAN ASSISTANT: WAYNE GOLD PA-C  ANESTHESIA:   general  PATIENT CONDITION:  ICU - intubated and hemodynamically stable.  PRE-OPERATIVE WEIGHT: [id number]  COMPLICATIONS: NO KNOWN

## 2015-11-22 NOTE — Anesthesia Postprocedure Evaluation (Signed)
Anesthesia Post Note  Patient: Jerome Vaughn  Procedure(s) Performed: Procedure(s) (LRB): CORONARY ARTERY BYPASS GRAFTING (CABG) times five using the left internal mammary, right greater saphenous vein EVH, and left thigh greater saphenous vein EVH (N/A) TRANSESOPHAGEAL ECHOCARDIOGRAM (TEE) (N/A)  Patient location during evaluation: PACU Anesthesia Type: General Level of consciousness: patient remains intubated per anesthesia plan Pain management: pain level controlled Vital Signs Assessment: post-procedure vital signs reviewed and stable Respiratory status: patient remains intubated per anesthesia plan Cardiovascular status: blood pressure returned to baseline Anesthetic complications: no    Last Vitals:  Filed Vitals:   11/21/15 2047 11/22/15 0445  BP: 113/72 117/79  Pulse: 73 57  Temp: 36.8 C 36.7 C  Resp: 16 18    Last Pain: There were no vitals filed for this visit.               Tiajuana Amass

## 2015-11-22 NOTE — Anesthesia Procedure Notes (Signed)
Procedure Name: Intubation Date/Time: 11/22/2015 7:53 AM Performed by: Garrison Columbus T Pre-anesthesia Checklist: Patient identified, Emergency Drugs available, Suction available and Patient being monitored Patient Re-evaluated:Patient Re-evaluated prior to inductionOxygen Delivery Method: Circle system utilized Preoxygenation: Pre-oxygenation with 100% oxygen Intubation Type: IV induction Ventilation: Mask ventilation without difficulty and Oral airway inserted - appropriate to patient size Laryngoscope Size: Mac and 4 Grade View: Grade III Tube type: Oral Tube size: 8.0 mm Number of attempts: 1 Airway Equipment and Method: Stylet and Oral airway Placement Confirmation: ETT inserted through vocal cords under direct vision,  positive ETCO2 and breath sounds checked- equal and bilateral Secured at: 22 cm Tube secured with: Tape Dental Injury: Teeth and Oropharynx as per pre-operative assessment  Comments: Intubation by Wynne Dust, SRNA

## 2015-11-22 NOTE — Transfer of Care (Signed)
Immediate Anesthesia Transfer of Care Note  Patient: Jerome Vaughn  Procedure(s) Performed: Procedure(s): CORONARY ARTERY BYPASS GRAFTING (CABG) times five using the left internal mammary, right greater saphenous vein EVH, and left thigh greater saphenous vein EVH (N/A) TRANSESOPHAGEAL ECHOCARDIOGRAM (TEE) (N/A)  Patient Location: SICU  Anesthesia Type:General  Level of Consciousness: sedated, unresponsive and Patient remains intubated per anesthesia plan  Airway & Oxygen Therapy: Patient remains intubated per anesthesia plan and Patient placed on Ventilator (see vital sign flow sheet for setting)  Post-op Assessment: Report given to RN and Post -op Vital signs reviewed and stable  Post vital signs: Reviewed and stable  Last Vitals:  Filed Vitals:   11/21/15 2047 11/22/15 0445  BP: 113/72 117/79  Pulse: 73 57  Temp: 36.8 C 36.7 C  Resp: 16 18    Complications: No apparent anesthesia complications

## 2015-11-23 ENCOUNTER — Inpatient Hospital Stay (HOSPITAL_COMMUNITY): Payer: BLUE CROSS/BLUE SHIELD

## 2015-11-23 ENCOUNTER — Encounter (HOSPITAL_COMMUNITY): Payer: Self-pay | Admitting: Cardiothoracic Surgery

## 2015-11-23 DIAGNOSIS — Z951 Presence of aortocoronary bypass graft: Secondary | ICD-10-CM

## 2015-11-23 LAB — CREATININE, SERUM
CREATININE: 1.13 mg/dL (ref 0.61–1.24)
GFR calc Af Amer: >60 mL/min (ref >60)

## 2015-11-23 LAB — BASIC METABOLIC PANEL
ANION GAP: 9 (ref 5–15)
BUN: 13 mg/dL (ref 6–20)
CHLORIDE: 109 mmol/L (ref 101–111)
CO2: 20 mmol/L — ABNORMAL LOW (ref 22–32)
Calcium: 8 mg/dL — ABNORMAL LOW (ref 8.9–10.3)
Creatinine, Ser: 0.9 mg/dL (ref 0.61–1.24)
GFR calc Af Amer: >60 mL/min (ref >60)
Glucose, Bld: 94 mg/dL (ref 65–99)
POTASSIUM: 4.2 mmol/L (ref 3.5–5.1)
SODIUM: 138 mmol/L (ref 135–145)

## 2015-11-23 LAB — POCT I-STAT, CHEM 8
BUN: 23 mg/dL — ABNORMAL HIGH (ref 6–20)
CHLORIDE: 101 mmol/L (ref 101–111)
Calcium, Ion: 1.12 mmol/L — ABNORMAL LOW (ref 1.13–1.30)
Creatinine, Ser: 1.1 mg/dL (ref 0.61–1.24)
Glucose, Bld: 186 mg/dL — ABNORMAL HIGH (ref 65–99)
HEMATOCRIT: 31 % — AB (ref 39.0–52.0)
HEMOGLOBIN: 10.5 g/dL — AB (ref 13.0–17.0)
POTASSIUM: 4.3 mmol/L (ref 3.5–5.1)
SODIUM: 136 mmol/L (ref 135–145)
TCO2: 22 mmol/L (ref 0–100)

## 2015-11-23 LAB — GLUCOSE, CAPILLARY
GLUCOSE-CAPILLARY: 100 mg/dL — AB (ref 65–99)
GLUCOSE-CAPILLARY: 107 mg/dL — AB (ref 65–99)
GLUCOSE-CAPILLARY: 111 mg/dL — AB (ref 65–99)
GLUCOSE-CAPILLARY: 119 mg/dL — AB (ref 65–99)
GLUCOSE-CAPILLARY: 157 mg/dL — AB (ref 65–99)
GLUCOSE-CAPILLARY: 85 mg/dL (ref 65–99)
GLUCOSE-CAPILLARY: 89 mg/dL (ref 65–99)
GLUCOSE-CAPILLARY: 93 mg/dL (ref 65–99)
GLUCOSE-CAPILLARY: 97 mg/dL (ref 65–99)
GLUCOSE-CAPILLARY: 99 mg/dL (ref 65–99)
Glucose-Capillary: 102 mg/dL — ABNORMAL HIGH (ref 65–99)
Glucose-Capillary: 107 mg/dL — ABNORMAL HIGH (ref 65–99)
Glucose-Capillary: 109 mg/dL — ABNORMAL HIGH (ref 65–99)
Glucose-Capillary: 109 mg/dL — ABNORMAL HIGH (ref 65–99)
Glucose-Capillary: 121 mg/dL — ABNORMAL HIGH (ref 65–99)
Glucose-Capillary: 128 mg/dL — ABNORMAL HIGH (ref 65–99)
Glucose-Capillary: 131 mg/dL — ABNORMAL HIGH (ref 65–99)
Glucose-Capillary: 91 mg/dL (ref 65–99)
Glucose-Capillary: 96 mg/dL (ref 65–99)
Glucose-Capillary: 96 mg/dL (ref 65–99)

## 2015-11-23 LAB — CBC
HCT: 28.9 % — ABNORMAL LOW (ref 39.0–52.0)
HCT: 29.3 % — ABNORMAL LOW (ref 39.0–52.0)
HEMOGLOBIN: 9.7 g/dL — AB (ref 13.0–17.0)
HEMOGLOBIN: 9.9 g/dL — AB (ref 13.0–17.0)
MCH: 30.3 pg (ref 26.0–34.0)
MCH: 30.3 pg (ref 26.0–34.0)
MCHC: 33.6 g/dL (ref 30.0–36.0)
MCHC: 33.8 g/dL (ref 30.0–36.0)
MCV: 90.3 fL (ref 78.0–100.0)
MCV: 89.6 fL (ref 78.0–100.0)
PLATELETS: 88 10*3/uL — AB (ref 150–400)
Platelets: 107 10*3/uL — ABNORMAL LOW (ref 150–400)
RBC: 3.2 MIL/uL — AB (ref 4.22–5.81)
RBC: 3.27 MIL/uL — ABNORMAL LOW (ref 4.22–5.81)
RDW: 13.5 % (ref 11.5–15.5)
RDW: 13.6 % (ref 11.5–15.5)
WBC: 5.9 10*3/uL (ref 4.0–10.5)
WBC: 5.9 10*3/uL (ref 4.0–10.5)

## 2015-11-23 LAB — PREPARE FRESH FROZEN PLASMA
Unit division: 0
Unit division: 0

## 2015-11-23 LAB — MAGNESIUM
MAGNESIUM: 2.7 mg/dL — AB (ref 1.7–2.4)
MAGNESIUM: 2.7 mg/dL — AB (ref 1.7–2.4)

## 2015-11-23 MED ORDER — INSULIN DETEMIR 100 UNIT/ML ~~LOC~~ SOLN
12.0000 [IU] | Freq: Two times a day (BID) | SUBCUTANEOUS | Status: DC
  Administered 2015-11-23 (×2): 12 [IU] via SUBCUTANEOUS
  Filled 2015-11-23 (×4): qty 0.12

## 2015-11-23 MED ORDER — INSULIN ASPART 100 UNIT/ML ~~LOC~~ SOLN
0.0000 [IU] | SUBCUTANEOUS | Status: DC
Start: 2015-11-23 — End: 2015-11-25
  Administered 2015-11-23 (×2): 2 [IU] via SUBCUTANEOUS
  Administered 2015-11-23: 4 [IU] via SUBCUTANEOUS
  Administered 2015-11-24 (×2): 2 [IU] via SUBCUTANEOUS
  Administered 2015-11-24: 4 [IU] via SUBCUTANEOUS
  Administered 2015-11-24 – 2015-11-25 (×3): 2 [IU] via SUBCUTANEOUS

## 2015-11-23 MED ORDER — SODIUM BICARBONATE 8.4 % IV SOLN: 50.0000 meq | Freq: Once | INTRAVENOUS | Status: AC

## 2015-11-23 NOTE — Care Management Note (Signed)
Case Management Note  Patient Details  Name: Jerome Vaughn MRN: [id number] Date of Birth: 17-May-1951  Subjective/Objective:   Pt lives with spouse, states she works from home and will be available to provide 24/7 assistance when he is medically stable for discharge.                      Expected Discharge Plan:  Home/Self Care  Discharge planning Services  CM Consult  Status of Service:  In process, will continue to follow  Jerome Cooter, RN 11/23/2015, 1:45 PM

## 2015-11-23 NOTE — Progress Notes (Signed)
1 [place] Post-Op Procedure(s) (LRB): CORONARY ARTERY BYPASS GRAFTING (CABG) times five using the left internal mammary, right greater saphenous vein EVH, and left thigh greater saphenous vein EVH (N/A) TRANSESOPHAGEAL ECHOCARDIOGRAM (TEE) (N/A) Subjective: Doing well after multivessel CABG for unstable angina, severe left main stenosis Hemodynamic stable, cardiac index 2.2 Chest x-ray with subsegmental atelectasis left base Chest tube drainage remained significant-we'll leave both chest tubes today Patient ready for progression to chair, ambulation Continue sliding scale insulin with Lantus insulin for diabetic control  Objective: Vital signs in last 24 hours: Temp:  [96.3 F (35.7 C)-99.3 F (37.4 C)] 98.1 F (36.7 C) (02/24 0745) Pulse Rate:  [86-100] 91 (02/24 0745) Cardiac Rhythm:  [-] Normal sinus rhythm (02/24 0600) Resp:  [0-38] 21 (02/24 0745) BP: (97-124)/(64-85) 101/67 mmHg (02/24 0700) SpO2:  [94 %-100 %] 96 % (02/24 0745) Arterial Line BP: (89-129)/(51-77) 99/55 mmHg (02/24 0745) FiO2 (%):  [40 %-50 %] 40 % (02/23 1945) Weight:  [192 lb 8 oz (87.317 kg)] 192 lb 8 oz (87.317 kg) (02/24 0615)  Hemodynamic parameters for last 24 hours: PAP: (17-36)/(7-22) 31/16 mmHg CO:  [3.6 L/min-6.9 L/min] 6.3 L/min CI:  [1.8 L/min/m2-3.5 L/min/m2] 3.2 L/min/m2  Intake/Output from previous [place]: 02/23 0701 - 02/24 0700 In: 5067.3 [I.V.:2792.3; Blood:1155; IV Piggyback:1120] Out: [id number] [Urine:2455; Emesis/NG output:20; Blood:1350; Chest Tube:520] Intake/Output this shift:        Physical Exam      Exam    General- alert and comfortable   Lungs- clear without rales, wheezes   Cor- regular rate and rhythm, no murmur , gallop   Abdomen- soft, non-tender   Extremities - warm, non-tender, minimal edema   Neuro- oriented, appropriate, no focal weakness     Lab Results:  Recent Labs  11/22/15 2035 11/23/15 0416  WBC 6.7 5.9  HGB 9.8* 9.7*  HCT 30.2* 28.9*  PLT 90* 88*    BMET:  Recent Labs  11/22/15 0500  11/22/15 2021 11/22/15 2035 11/23/15 0416  NA 140  < > 140  --  138  K 4.1  < > 4.5  --  4.2  CL 103  < > 107  --  109  CO2 25  --   --   --  20*  GLUCOSE 131*  < > 113*  --  94  BUN 15  < > 15  --  13  CREATININE 1.11  < > 0.90 1.00 0.90  CALCIUM 9.6  --   --   --  8.0*  < > = values in this interval not displayed.  PT/INR:  Recent Labs  11/22/15 1420  LABPROT 17.8*  INR 1.46   ABG    Component Value Date/Time   PHART 7.344* 11/22/2015 2123   HCO3 18.5* 11/22/2015 2123   TCO2 19 11/22/2015 2123   ACIDBASEDEF 6.0* 11/22/2015 2123   O2SAT 96.0 11/22/2015 2123   CBG (last 3)   Recent Labs  11/21/15 1633 11/21/15 2045 11/22/15 1458  GLUCAP 118* 189* 109*    Assessment/Plan: S/P Procedure(s) (LRB): CORONARY ARTERY BYPASS GRAFTING (CABG) times five using the left internal mammary, right greater saphenous vein EVH, and left thigh greater saphenous vein EVH (N/A) TRANSESOPHAGEAL ECHOCARDIOGRAM (TEE) (N/A) Mobilize Diuresis Diabetes control d/c tubes/lines See progression orders Leave both chest tubes today for significant drainage   LOS: 2 days    Jerome Vaughn 11/23/2015

## 2015-11-23 NOTE — Progress Notes (Signed)
TCTS BRIEF SICU PROGRESS NOTE  1 Jerome Vaughn  S/P Procedure(s) (LRB): CORONARY ARTERY BYPASS GRAFTING (CABG) times five using the left internal mammary, right greater saphenous vein EVH, and left thigh greater saphenous vein EVH (N/A) TRANSESOPHAGEAL ECHOCARDIOGRAM (TEE) (N/A)   Stable [place] NSR w/ stable BP off all drips O2 sats 97% on 2 L/min via [place] UOP adequate Labs okay  Plan: Continue routine care  Rexene Alberts, MD 11/23/2015 5:13 PM

## 2015-11-24 ENCOUNTER — Inpatient Hospital Stay (HOSPITAL_COMMUNITY): Payer: BLUE CROSS/BLUE SHIELD

## 2015-11-24 LAB — BASIC METABOLIC PANEL
Anion gap: 5 (ref 5–15)
BUN: 26 mg/dL — ABNORMAL HIGH (ref 6–20)
CO2: 25 mmol/L (ref 22–32)
Calcium: 8.1 mg/dL — ABNORMAL LOW (ref 8.9–10.3)
Chloride: 105 mmol/L (ref 101–111)
Creatinine, Ser: 1.07 mg/dL (ref 0.61–1.24)
GFR calc Af Amer: >60 mL/min (ref >60)
GFR calc non Af Amer: >60 mL/min (ref >60)
Glucose, Bld: 135 mg/dL — ABNORMAL HIGH (ref 65–99)
Potassium: 4.4 mmol/L (ref 3.5–5.1)
Sodium: 135 mmol/L (ref 135–145)

## 2015-11-24 LAB — GLUCOSE, CAPILLARY
GLUCOSE-CAPILLARY: 133 mg/dL — AB (ref 65–99)
GLUCOSE-CAPILLARY: 121 mg/dL — AB (ref 65–99)
GLUCOSE-CAPILLARY: 138 mg/dL — AB (ref 65–99)
GLUCOSE-CAPILLARY: 169 mg/dL — AB (ref 65–99)
GLUCOSE-CAPILLARY: 115 mg/dL — AB (ref 65–99)
Glucose-Capillary: 134 mg/dL — ABNORMAL HIGH (ref 65–99)

## 2015-11-24 LAB — CBC
HCT: 28.4 % — ABNORMAL LOW (ref 39.0–52.0)
Hemoglobin: 9.1 g/dL — ABNORMAL LOW (ref 13.0–17.0)
MCH: 28.8 pg (ref 26.0–34.0)
MCHC: 32 g/dL (ref 30.0–36.0)
MCV: 89.9 fL (ref 78.0–100.0)
Platelets: 91 10*3/uL — ABNORMAL LOW (ref 150–400)
RBC: 3.16 MIL/uL — ABNORMAL LOW (ref 4.22–5.81)
RDW: 13.5 % (ref 11.5–15.5)
WBC: 5.4 10*3/uL (ref 4.0–10.5)

## 2015-11-24 MED ORDER — SODIUM CHLORIDE 0.9% FLUSH: 3.0000 mL | INTRAVENOUS | Status: DC | PRN

## 2015-11-24 MED ORDER — METOCLOPRAMIDE HCL 5 MG/ML IJ SOLN
10.0000 mg | Freq: Four times a day (QID) | INTRAMUSCULAR | Status: AC
  Administered 2015-11-24 – 2015-11-25 (×4): 10 mg via INTRAVENOUS
  Filled 2015-11-24 (×4): qty 2

## 2015-11-24 MED ORDER — MORPHINE SULFATE (PF) 2 MG/ML IV SOLN
2.0000 mg | INTRAVENOUS | Status: DC | PRN
Start: 2015-11-24 — End: 2015-11-25
  Administered 2015-11-24: 2 mg via INTRAVENOUS
  Filled 2015-11-24: qty 1

## 2015-11-24 MED ORDER — SODIUM CHLORIDE 0.9% FLUSH
3.0000 mL | Freq: Two times a day (BID) | INTRAVENOUS | Status: DC
  Administered 2015-11-24: 3 mL via INTRAVENOUS
  Administered 2015-11-24: 10 mL via INTRAVENOUS
  Administered 2015-11-24: 3 mL via INTRAVENOUS
  Administered 2015-11-25: 10 mL via INTRAVENOUS
  Administered 2015-11-25 – 2015-11-26 (×2): 3 mL via INTRAVENOUS
  Administered 2015-11-26: 10 mL via INTRAVENOUS

## 2015-11-24 MED ORDER — MOVING RIGHT ALONG BOOK
Freq: Once | Status: AC
  Administered 2015-11-24: 10:00:00
  Filled 2015-11-24: qty 1

## 2015-11-24 MED ORDER — SODIUM CHLORIDE 0.9 % IV SOLN: 250.0000 mL | INTRAVENOUS | Status: DC | PRN

## 2015-11-24 MED ORDER — INSULIN DETEMIR 100 UNIT/ML ~~LOC~~ SOLN
24.0000 [IU] | Freq: Every day | SUBCUTANEOUS | Status: DC
  Administered 2015-11-24 – 2015-11-27 (×4): 24 [IU] via SUBCUTANEOUS
  Filled 2015-11-24 (×4): qty 0.24

## 2015-11-24 NOTE — Progress Notes (Signed)
[address]       [place] [address]             [phone]        CARDIOTHORACIC SURGERY PROGRESS NOTE   R2 Days Post-Op Procedure(s) (LRB): CORONARY ARTERY BYPASS GRAFTING (CABG) times five using the left internal mammary, right greater saphenous vein EVH, and left thigh greater saphenous vein EVH (N/A) TRANSESOPHAGEAL ECHOCARDIOGRAM (TEE) (N/A)  Subjective: Feels uncomfortable due to abdominal distension and "gas" pains  Objective: Vital signs: BP Readings from Last 1 Encounters:  11/24/15 110/80   Pulse Readings from Last 1 Encounters:  11/24/15 86   Resp Readings from Last 1 Encounters:  11/24/15 12   Temp Readings from Last 1 Encounters:  11/24/15 98.4 F (36.9 C) Oral    Hemodynamics: PAP: (33)/(19) 33/19 mmHg  Physical Exam:  Rhythm:   sinus  Breath sounds: clear  Heart sounds:  RRR  Incisions:  Dressing dry, intact  Abdomen:  Minimal bowel sounds, moderately distended and tympanitic, mild tenderness, no rebound/guarding  Extremities:  Warm, well-perfused  Chest tubes:  Low volume thin serosanguinous output, no air leak    Intake/Output from previous [place]: 02/24 0701 - 02/25 0700 In: 1897.2 [P.O.:1200; I.V.:597.2; IV Piggyback:100] Out: 1375 [Urine:925; Chest Tube:450] Intake/Output this shift: Total I/O In: 20 [I.V.:20] Out: -   Lab Results:  CBC: Recent Labs  11/23/15 1738 11/24/15 0406  WBC 5.9 5.4  HGB 9.9* 9.1*  HCT 29.3* 28.4*  PLT 107* 91*    BMET:  Recent Labs  11/23/15 0416 11/23/15 1733 11/23/15 1738 11/24/15 0406  NA 138 136  --  135  K 4.2 4.3  --  4.4  CL 109 101  --  105  CO2 20*  --   --  25  GLUCOSE 94 186*  --  135*  BUN 13 23*  --  26*  CREATININE 0.90 1.10 1.13 1.07  CALCIUM 8.0*  --   --  8.1*     PT/INR:   Recent Labs  11/22/15 1420  LABPROT 17.8*  INR 1.46    CBG (last 3)   Recent Labs  11/23/15 2102 11/23/15 2311 11/24/15 0331  GLUCAP 157* 133* 134*    ABG      Component Value Date/Time   PHART 7.344* 11/22/2015 2123   PCO2ART 34.1* 11/22/2015 2123   PO2ART 88.0 11/22/2015 2123   HCO3 18.5* 11/22/2015 2123   TCO2 22 11/23/2015 1733   ACIDBASEDEF 6.0* 11/22/2015 2123   O2SAT 96.0 11/22/2015 2123    CXR: PORTABLE CHEST 1 VIEW  COMPARISON: November 23, 2015  FINDINGS: A left chest tube remains in place. The PA catheter has been removed with a right jugular sheath remaining. No pneumothorax. Cardiomediastinal silhouette is stable. Mild atelectasis remains in the left retrocardiac region. No other interval changes or acute abnormalities.  IMPRESSION: No significant interval change. Removal of PA catheter.   Electronically Signed  By: Dorise Bullion III M.D  On: 11/24/2015 07:23   Assessment/Plan: S/P Procedure(s) (LRB): CORONARY ARTERY BYPASS GRAFTING (CABG) times five using the left internal mammary, right greater saphenous vein EVH, and left thigh greater saphenous vein EVH (N/A) TRANSESOPHAGEAL ECHOCARDIOGRAM (TEE) (N/A)  Overall stable POD2 Maintaining NSR w/ stable BP Breathing comfortably w/ O2 sats 98-100% on 2 L/min via [place] Abdominal distension and hypoactive bowel sounds w/ mild discomfort, no peritoneal signs - c/w postoperative ileus Expected post op acute blood loss anemia, mild Expected post op volume  excess, mild Expected post op atelectasis, mild Type II diabetes mellitus, excellent glycemic control   Check KUB  Diet sips clear liquids only for now  Add Reglan  D/C chest tubes  Mobilize  Hold diuretics for now    Rexene Alberts, MD 11/24/2015 8:56 AM

## 2015-11-24 NOTE — Progress Notes (Signed)
TCTS BRIEF SICU PROGRESS NOTE  2 Days Post-Op  S/P Procedure(s) (LRB): CORONARY ARTERY BYPASS GRAFTING (CABG) times five using the left internal mammary, right greater saphenous vein EVH, and left thigh greater saphenous vein EVH (N/A) TRANSESOPHAGEAL ECHOCARDIOGRAM (TEE) (N/A)   Stable [place] Passed some gas and feels much better Ambulated around unit twice NSR w/ stable BP O2 sats 95% on RA UOP adequate  Plan: Continue routine care  Rexene Alberts, MD 11/24/2015 7:04 PM

## 2015-11-24 NOTE — Progress Notes (Signed)
EKG CRITICAL VALUE     12 lead EKG performed.  Critical value noted.  C[place], RN notified.   Neva Seat, CCT 11/24/2015 9:34 AM

## 2015-11-25 ENCOUNTER — Inpatient Hospital Stay (HOSPITAL_COMMUNITY): Payer: BLUE CROSS/BLUE SHIELD

## 2015-11-25 LAB — CBC
HCT: 27.2 % — ABNORMAL LOW (ref 39.0–52.0)
HEMOGLOBIN: 8.8 g/dL — AB (ref 13.0–17.0)
MCH: 28.9 pg (ref 26.0–34.0)
MCHC: 32.4 g/dL (ref 30.0–36.0)
MCV: 89.5 fL (ref 78.0–100.0)
Platelets: 107 10*3/uL — ABNORMAL LOW (ref 150–400)
RBC: 3.04 MIL/uL — AB (ref 4.22–5.81)
RDW: 13.2 % (ref 11.5–15.5)
WBC: 4.7 10*3/uL (ref 4.0–10.5)

## 2015-11-25 LAB — TYPE AND SCREEN
ABO/RH(D): A POS
Antibody Screen: NEGATIVE
Unit division: 0
Unit division: 0

## 2015-11-25 LAB — COMPREHENSIVE METABOLIC PANEL
ALBUMIN: 2.8 g/dL — AB (ref 3.5–5.0)
ALK PHOS: 27 U/L — AB (ref 38–126)
ALT: 19 U/L (ref 17–63)
ANION GAP: 8 (ref 5–15)
AST: 33 U/L (ref 15–41)
BUN: 18 mg/dL (ref 6–20)
CALCIUM: 8.2 mg/dL — AB (ref 8.9–10.3)
CO2: 25 mmol/L (ref 22–32)
CREATININE: 0.97 mg/dL (ref 0.61–1.24)
Chloride: 104 mmol/L (ref 101–111)
GFR calc Af Amer: >60 mL/min (ref >60)
GFR calc non Af Amer: >60 mL/min (ref >60)
GLUCOSE: 194 mg/dL — AB (ref 65–99)
Potassium: 4.4 mmol/L (ref 3.5–5.1)
SODIUM: 137 mmol/L (ref 135–145)
Total Bilirubin: 0.4 mg/dL (ref 0.3–1.2)
Total Protein: 5.5 g/dL — ABNORMAL LOW (ref 6.5–8.1)

## 2015-11-25 LAB — GLUCOSE, CAPILLARY
GLUCOSE-CAPILLARY: 105 mg/dL — AB (ref 65–99)
GLUCOSE-CAPILLARY: 113 mg/dL — AB (ref 65–99)
GLUCOSE-CAPILLARY: 135 mg/dL — AB (ref 65–99)
GLUCOSE-CAPILLARY: 157 mg/dL — AB (ref 65–99)
Glucose-Capillary: 160 mg/dL — ABNORMAL HIGH (ref 65–99)
Glucose-Capillary: 153 mg/dL — ABNORMAL HIGH (ref 65–99)

## 2015-11-25 LAB — AMYLASE: Amylase: 53 U/L (ref 28–100)

## 2015-11-25 LAB — LIPASE, BLOOD: Lipase: 33 U/L (ref 11–51)

## 2015-11-25 MED ORDER — FUROSEMIDE 10 MG/ML IJ SOLN: 20.0000 mg | Freq: Every day | INTRAMUSCULAR | Status: DC

## 2015-11-25 MED ORDER — INSULIN ASPART 100 UNIT/ML ~~LOC~~ SOLN
0.0000 [IU] | Freq: Three times a day (TID) | SUBCUTANEOUS | Status: DC
  Administered 2015-11-25 (×2): 2 [IU] via SUBCUTANEOUS
  Administered 2015-11-26: 4 [IU] via SUBCUTANEOUS
  Administered 2015-11-26: 2 [IU] via SUBCUTANEOUS
  Administered 2015-11-26: 4 [IU] via SUBCUTANEOUS

## 2015-11-25 NOTE — Progress Notes (Addendum)
      [address]       [place] [address]             [phone]        CARDIOTHORACIC SURGERY PROGRESS NOTE   R3 Days Post-Op Procedure(s) (LRB): CORONARY ARTERY BYPASS GRAFTING (CABG) times five using the left internal mammary, right greater saphenous vein EVH, and left thigh greater saphenous vein EVH (N/A) TRANSESOPHAGEAL ECHOCARDIOGRAM (TEE) (N/A)  Subjective: Looks good and feels well.  Just ate breakfast.  Passing gas but no BM yet.  Ambulated already once today.  Objective: Vital signs: BP Readings from Last 1 Encounters:  11/25/15 97/72   Pulse Readings from Last 1 Encounters:  11/25/15 91   Resp Readings from Last 1 Encounters:  11/25/15 26   Temp Readings from Last 1 Encounters:  11/25/15 98.8 F (37.1 C) Oral    Hemodynamics:    Physical Exam:  Rhythm:   sinus  Breath sounds: clear  Heart sounds:  RRR  Incisions:  Clean and dry  Abdomen:  Soft, non-distended, non-tender  Extremities:  Warm, well-perfused    Intake/Output from previous [place]: 02/25 0701 - 02/26 0700 In: 1300 [P.O.:1280; I.V.:20] Out: 2475 [Urine:2475] Intake/Output this shift: Total I/O In: 120 [P.O.:120] Out: -   Lab Results:  CBC: Recent Labs  11/24/15 0406 11/25/15 0245  WBC 5.4 4.7  HGB 9.1* 8.8*  HCT 28.4* 27.2*  PLT 91* 107*    BMET:  Recent Labs  11/24/15 0406 11/25/15 0245  NA 135 137  K 4.4 4.4  CL 105 104  CO2 25 25  GLUCOSE 135* 194*  BUN 26* 18  CREATININE 1.07 0.97  CALCIUM 8.1* 8.2*     PT/INR:   Recent Labs  11/22/15 1420  LABPROT 17.8*  INR 1.46    CBG (last 3)   Recent Labs  11/24/15 2101 11/25/15 0026 11/25/15 0404  GLUCAP 115* 105* 160*    ABG    Component Value Date/Time   PHART 7.344* 11/22/2015 2123   PCO2ART 34.1* 11/22/2015 2123   PO2ART 88.0 11/22/2015 2123   HCO3 18.5* 11/22/2015 2123   TCO2 22 11/23/2015 1733   ACIDBASEDEF 6.0* 11/22/2015 2123   O2SAT 96.0 11/22/2015 2123     CXR: CHEST 2 VIEW  COMPARISON: 11/24/2015  FINDINGS: Cardiomegaly. Prior CABG. Interval removal of left chest tube. No pneumothorax. Areas of atelectasis in the left mid and lower lung. Right lung is clear. Small left pleural effusion.  IMPRESSION: Interval removal of left chest tube without pneumothorax. Areas of atelectasis in the left lung with small left effusion.   Electronically Signed  By: Rolm Baptise M.D.  On: 11/25/2015 07:29   Assessment/Plan: S/P Procedure(s) (LRB): CORONARY ARTERY BYPASS GRAFTING (CABG) times five using the left internal mammary, right greater saphenous vein EVH, and left thigh greater saphenous vein EVH (N/A) TRANSESOPHAGEAL ECHOCARDIOGRAM (TEE) (N/A)  Overall stable POD3 Maintaining NSR w/ stable BP Breathing comfortably w/ O2 sats 96-97% on room air Abdominal distension has resolved, tolerating regular diet although no BM yet Expected post op acute blood loss anemia, mild Expected post op volume excess, mild Expected post op atelectasis, mild Type II diabetes mellitus, excellent glycemic control   Mobilize  Diuresis  Change CBG's and SSI to ac/hs  Continue levemir insulin for now and possibly restart oral agents tomorrow   Transfer 2W  Rexene Alberts, MD 11/25/2015 9:19 AM

## 2015-11-25 NOTE — Plan of Care (Signed)
Problem: Bowel/Gastric: Goal: Gastrointestinal status for postoperative course will improve Outcome: Progressing Pt with active bowel sounds, passing significant flatus, no bowel related complaints  Problem: Nutritional: Goal: Risk for body nutrition deficit will decrease Outcome: Progressing Pt ate Carb Mod/Heart Healthy diet today, appetite improving  Problem: Respiratory: Goal: Levels of oxygenation will improve Outcome: Completed/Met Date Met:  11/25/15 sats WNL on RA

## 2015-11-26 LAB — GLUCOSE, CAPILLARY
GLUCOSE-CAPILLARY: 164 mg/dL — AB (ref 65–99)
GLUCOSE-CAPILLARY: 188 mg/dL — AB (ref 65–99)
Glucose-Capillary: 125 mg/dL — ABNORMAL HIGH (ref 65–99)
Glucose-Capillary: 130 mg/dL — ABNORMAL HIGH (ref 65–99)

## 2015-11-26 LAB — BASIC METABOLIC PANEL
ANION GAP: 9 (ref 5–15)
BUN: 15 mg/dL (ref 6–20)
CHLORIDE: 105 mmol/L (ref 101–111)
CO2: 25 mmol/L (ref 22–32)
CREATININE: 0.89 mg/dL (ref 0.61–1.24)
Calcium: 8.6 mg/dL — ABNORMAL LOW (ref 8.9–10.3)
GFR calc non Af Amer: >60 mL/min (ref >60)
GLUCOSE: 132 mg/dL — AB (ref 65–99)
Potassium: 4.4 mmol/L (ref 3.5–5.1)
Sodium: 139 mmol/L (ref 135–145)

## 2015-11-26 LAB — CBC
HCT: 27.5 % — ABNORMAL LOW (ref 39.0–52.0)
HEMOGLOBIN: 8.8 g/dL — AB (ref 13.0–17.0)
MCH: 28.5 pg (ref 26.0–34.0)
MCHC: 32 g/dL (ref 30.0–36.0)
MCV: 89 fL (ref 78.0–100.0)
Platelets: 122 10*3/uL — ABNORMAL LOW (ref 150–400)
RBC: 3.09 MIL/uL — AB (ref 4.22–5.81)
RDW: 13 % (ref 11.5–15.5)
WBC: 4.4 10*3/uL (ref 4.0–10.5)

## 2015-11-26 MED ORDER — METOPROLOL TARTRATE 25 MG PO TABS
25.0000 mg | ORAL_TABLET | Freq: Two times a day (BID) | ORAL | Status: DC
  Administered 2015-11-26 – 2015-11-27 (×2): 25 mg via ORAL
  Filled 2015-11-26 (×3): qty 1

## 2015-11-26 MED ORDER — FUROSEMIDE 40 MG PO TABS
40.0000 mg | ORAL_TABLET | Freq: Every day | ORAL | Status: DC
  Administered 2015-11-26 – 2015-11-27 (×2): 40 mg via ORAL
  Filled 2015-11-26 (×2): qty 1

## 2015-11-26 NOTE — Progress Notes (Signed)
4 Days Post-Op Procedure(s) (LRB): CORONARY ARTERY BYPASS GRAFTING (CABG) times five using the left internal mammary, right greater saphenous vein EVH, and left thigh greater saphenous vein EVH (N/A) TRANSESOPHAGEAL ECHOCARDIOGRAM (TEE) (N/A) Subjective: Doing well No GI complaints NSRstable  Objective: Vital signs in last 24 hours: Temp:  [98.2 F (36.8 C)-99.1 F (37.3 C)] 98.2 F (36.8 C) (02/27 0734) Pulse Rate:  [86-103] 96 (02/27 0400) Cardiac Rhythm:  [-] Sinus tachycardia (02/26 2000) Resp:  [10-34] 16 (02/27 0400) BP: (90-135)/(65-102) 103/77 mmHg (02/27 0400) SpO2:  [93 %-98 %] 96 % (02/27 0400) Weight:  [192 lb 14.4 oz (87.5 kg)] 192 lb 14.4 oz (87.5 kg) (02/27 0500)  Hemodynamic parameters for last 24 hours:    Intake/Output from previous [place]: 02/26 0701 - 02/27 0700 In: 1320 [P.O.:1320] Out: 1645 [Urine:1645] Intake/Output this shift:         Exam    General- alert and comfortable   Lungs- clear without rales, wheezes   Cor- regular rate and rhythm, no murmur , gallop   Abdomen- soft, non-tender   Extremities - warm, non-tender, minimal edema   Neuro- oriented, appropriate, no focal weakness   Lab Results:  Recent Labs  11/25/15 0245 11/26/15 0240  WBC 4.7 4.4  HGB 8.8* 8.8*  HCT 27.2* 27.5*  PLT 107* 122*   BMET:  Recent Labs  11/25/15 0245 11/26/15 0240  NA 137 139  K 4.4 4.4  CL 104 105  CO2 25 25  GLUCOSE 194* 132*  BUN 18 15  CREATININE 0.97 0.89  CALCIUM 8.2* 8.6*    PT/INR: No results for input(s): LABPROT, INR in the last 72 hours. ABG    Component Value Date/Time   PHART 7.344* 11/22/2015 2123   HCO3 18.5* 11/22/2015 2123   TCO2 22 11/23/2015 1733   ACIDBASEDEF 6.0* 11/22/2015 2123   O2SAT 96.0 11/22/2015 2123   CBG (last 3)   Recent Labs  11/25/15 1146 11/25/15 1557 11/25/15 2144  GLUCAP 135* 157* 153*    Assessment/Plan: S/P Procedure(s) (LRB): CORONARY ARTERY BYPASS GRAFTING (CABG) times five using the  left internal mammary, right greater saphenous vein EVH, and left thigh greater saphenous vein EVH (N/A) TRANSESOPHAGEAL ECHOCARDIOGRAM (TEE) (N/A) Mobilize Diuresis Diabetes control d/c pacing wires plan DC tomorrow 2-28   LOS: 5 days    Jerome Vaughn 11/26/2015

## 2015-11-26 NOTE — Progress Notes (Signed)
Patient ID: Jerome Vaughn, male   DOB: Oct 17, 1950, 65 y.o.   MRN: [id number]  SICU Evening Rounds:  Hemodynamically stable  Ambulating well  Urine output good  Still waiting on 2W bed  Scheduled to go home tomorrow.

## 2015-11-27 LAB — GLUCOSE, CAPILLARY: GLUCOSE-CAPILLARY: 115 mg/dL — AB (ref 65–99)

## 2015-11-27 MED ORDER — METOPROLOL TARTRATE 25 MG PO TABS: 25.0000 mg | ORAL_TABLET | Freq: Two times a day (BID) | ORAL | Status: DC

## 2015-11-27 MED ORDER — ASPIRIN 325 MG PO TBEC: 325.0000 mg | DELAYED_RELEASE_TABLET | Freq: Every day | ORAL | Status: DC

## 2015-11-27 MED ORDER — OXYCODONE HCL 5 MG PO TABS: 5.0000 mg | ORAL_TABLET | ORAL | Status: DC | PRN

## 2015-11-27 NOTE — Significant Event (Signed)
EPWs removed per new orders. No bleeding or tissue at end tips of EPW noted. Patient is bedrest at this time. Will continue to monitor. Jerome Vaughn, Therapist, sports

## 2015-11-27 NOTE — Significant Event (Signed)
Patient discharge to home at 1150am, taken to transportation via wheelchair by [facility]. VS stable prior to discharge. Patient took home all his belongings. Adiel Mcnamara, Therapist, sports.

## 2015-11-27 NOTE — Significant Event (Signed)
Patient has order for chest tube sutures to be removed and place steristrips on the sites. However, when RN went to remove them, patient stated Dr. Prescott Gum had told him this morning that it will be removed in his office in the follow-up appointment. Relay this to [place], who stated to leave sutures as is.

## 2015-11-27 NOTE — Discharge Summary (Signed)
Physician Discharge Summary  Patient ID: Jerome Vaughn MRN: [id number] DOB/AGE: 05/18/1951 65 y.o.  Admit date: 11/21/2015 Discharge date: 11/27/2015  Admission Diagnoses: Exertional angina/severe coronary artery disease  Discharge Diagnoses:  Active Problems:   Hypertension   Hyperlipidemia   Tobacco use disorder   Diabetes mellitus without complication (HCC)   Abnormal stress test   CAD (coronary artery disease)   S/P CABG x 5  Patient Active Problem List   Diagnosis Date Noted  . 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 Abnormal stress test 11/21/2015  . CAD (coronary artery disease)   . Nonspecific abnormal electrocardiogram (ECG) (EKG) 11/07/2015  . Diabetes mellitus without complication ([place]) 26/83/4196  . Hypertension 03/30/2011  . Hyperlipidemia 03/30/2011  . Tobacco use disorder 03/30/2011  . Colon polyp 03/30/2011    History of Present Illness:  Patient examined, coronary angiograms personally reviewed and counseled with patient and family. [name] diabetic reformed smoker with class III angina. Stress test was markedly positive with ST segment changes in the inferior leads. Outpatient cardiac catheterization performed today demonstrates severe three-vessel coronary disease and probably preserved LV systolic function--echocardiogram pending. CABG has been recommended to the patient's cardiologist and I agree with that recommendation  Discharged Condition: good  Hospital Course: The patient underwent cardiac catheterization and was found to have severe three-vessel coronary artery disease. Cardiothoracic surgical consultation was obtained with Jerome Aquas Trigt MD who evaluated the patient and his studies and agree with recommendations to proceed with coronary artery surgical revascularization. He was taken to the operating room on 11/22/2015 and underwent the below described procedure. He tolerated it well and was taken to the surgical intensive care unit in stable  condition. Postoperatively the patient has progressed nicely. He was extubated from the ventilator without difficulty using standard protocols. All routine lines, monitors and drainage devices have been discontinued in the standard fashion. He has some volume overload but has responded well to diuretics. Incisions are noted to be healing well without evidence of infection. His diabetes has been under good control using standard measures. He has an expected acute blood loss anemia which is stable. He is tolerating gradually increasing activities using standard protocols. He is maintaining normal sinus rhythm. He was seen today on morning rounds by Dr. Lucianne Lei TR Vaughn who feels as though he is stable for discharge at this time.   Consults: cardiology  Significant Diagnostic Studies: angiography: cardiac cath  Treatments :   OPERATIVE REPORT   OPERATION: 1. Coronary artery bypass grafting x5 (left internal mammary artery to  LAD, saphenous vein graft to diagonal, sequential saphenous vein  graft to OM1 and distal circumflex, saphenous vein graft to  posterior descending). 2. Endoscopic harvest of right and left leg greater saphenous vein.  SURGEON: Jerome Vaughn, M.D.  ASSISTANT: Jerome Pierini, PA-C.  ANESTHESIA: General by Dr. Rodman Vaughn.  PREOPERATIVE DIAGNOSIS: Unstable angina, positive stress test, critical left main stenosis.  POSTOPERATIVE DIAGNOSIS: Unstable angina, positive stress test, critical left main stenosis.   Discharge Exam: Blood pressure 110/72, pulse 89, temperature 99 F (37.2 C), temperature source Oral, resp. rate 18, height 5' 7.5" (1.715 m), weight 189 lb 2.5 oz (85.8 kg), SpO2 94 %.    General- alert and comfortable  Lungs- clear without rales, wheezes  Cor- regular rate and rhythm, no murmur , gallop  Abdomen- soft, non-tender  Extremities - warm, non-tender, minimal edema  Neuro- oriented,  appropriate, no focal weakness   Disposition: 01-Home or Self Care  Medication List    STOP taking these medications        aspirin 81 MG tablet  Replaced by:  aspirin 325 MG EC tablet     benazepril 20 MG tablet  Commonly known as:  LOTENSIN     nitroGLYCERIN 0.4 MG SL tablet  Commonly known as:  NITROSTAT      TAKE these medications        ACCU-CHEK AVIVA PLUS w/Device Kit  Test once per Jerome Vaughn     accu-chek soft touch lancets  Dispense for Aviva plus and diagnosis code is E 11.9     aspirin 325 MG EC tablet  Take 1 tablet (325 mg total) by mouth daily.     bromocriptine 2.5 MG tablet  Commonly known as:  PARLODEL  Take 0.5 tablets (1.25 mg total) by mouth at bedtime.     canagliflozin 300 MG Tabs tablet  Commonly known as:  INVOKANA  Take 300 mg by mouth daily before breakfast.     CINNAMON PO  Take 2,000 mg by mouth daily.     Fish Oil 1000 MG Caps  Take 1,000 mg by mouth daily.     glipiZIDE 2.5 MG 24 hr tablet  Commonly known as:  GLUCOTROL XL  Take 1 tablet (2.5 mg total) by mouth daily with breakfast.     glucose blood test strip  Commonly known as:  ACCU-CHEK AVIVA PLUS  Diagnosis code is E 11.9     metFORMIN 500 MG 24 hr tablet  Commonly known as:  GLUCOPHAGE-XR  Take 4 tablets by mouth  daily with breakfast     metoprolol tartrate 25 MG tablet  Commonly known as:  LOPRESSOR  Take 1 tablet (25 mg total) by mouth 2 (two) times daily.     multivitamin tablet  Take 1 tablet by mouth daily.     oxyCODONE 5 MG immediate release tablet  Commonly known as:  Oxy IR/ROXICODONE  Take 1-2 tablets (5-10 mg total) by mouth every 4 (four) hours as needed for severe pain.     pioglitazone 45 MG tablet  Commonly known as:  ACTOS  Take 1 tablet by mouth  daily     rosuvastatin 40 MG tablet  Commonly known as:  CRESTOR  Take 40 mg by mouth daily.           Follow-up Information    Follow up with Jerome Grooms, MD.   Specialty:  Cardiology    Why:  office will contact you with a 2 week appt to see cardiolgy. If they have not contacted you in couple days , call the office to arrange   Contact information:   [address] [place] [address] [phone]       Follow up with Jerome Childs, MD.   Specialty:  Cardiothoracic Surgery   Why:  The office will contact you his appointment in 4 weeks to see the surgeon. Please obtain a chest x-ray at [facility] one half hour prior to this appointment. [facility] is located in the same office complex.   Contact information:   [address] [place] [address] [phone]      The patient has been discharged on:   1.Beta Blocker:  Yes Blue.Reese  ]                              No   [   ]  If No, reason:  2.Ace Inhibitor/ARB: Yes [   ]                                     No  [   n ]                                     If No, reason:low BP  3.Statin:   Yes Blue.Reese   ]                  No  [   ]                  If No, reason:  4.Ecasa:  Yes  [ y  ]                  No   [   ]                  If No, reason:  Signed: GOLD,WAYNE E 11/27/2015, 9:18 AM  patient examined and medical record reviewed,agree with above note. Jerome Vaughn III 11/27/2015

## 2015-11-27 NOTE — Progress Notes (Signed)
5 Days Post-Op Procedure(s) (LRB): CORONARY ARTERY BYPASS GRAFTING (CABG) times five using the left internal mammary, right greater saphenous vein EVH, and left thigh greater saphenous vein EVH (N/A) TRANSESOPHAGEAL ECHOCARDIOGRAM (TEE) (N/A) Subjective: Patient doing well 5 days after multivessel CABG The patient is currently resting comfortably in bed after his temporary epicardial pacing wires have been removed He is been in a stable sinus rhythm overnight He is medically ready for discharge later today  Objective: Vital signs in last 24 hours: Temp:  [97 F (36.1 C)-99 F (37.2 C)] 97 F (36.1 C) (02/28 1130) Pulse Rate:  [86-101] 94 (02/28 0915) Cardiac Rhythm:  [-] Normal sinus rhythm (02/28 0800) Resp:  [12-29] 14 (02/28 1130) BP: (103-125)/(65-92) 125/92 mmHg (02/28 1130) SpO2:  [94 %-100 %] 100 % (02/28 1130) Weight:  [189 lb 2.5 oz (85.8 kg)] 189 lb 2.5 oz (85.8 kg) (02/28 0500)  Hemodynamic parameters for last 24 hours:  stable  Intake/Output from previous [place]: 02/27 0701 - 02/28 0700 In: 740 [P.O.:740] Out: 1775 [Urine:1775] Intake/Output this shift:         Exam    General- alert and comfortable   Lungs- clear without rales, wheezes   Cor- regular rate and rhythm, no murmur , gallop   Abdomen- soft, non-tender   Extremities - warm, non-tender, minimal edema   Neuro- oriented, appropriate, no focal weakness   Lab Results:  Recent Labs  11/25/15 0245 11/26/15 0240  WBC 4.7 4.4  HGB 8.8* 8.8*  HCT 27.2* 27.5*  PLT 107* 122*   BMET:  Recent Labs  11/25/15 0245 11/26/15 0240  NA 137 139  K 4.4 4.4  CL 104 105  CO2 25 25  GLUCOSE 194* 132*  BUN 18 15  CREATININE 0.97 0.89  CALCIUM 8.2* 8.6*    PT/INR: No results for input(s): LABPROT, INR in the last 72 hours. ABG    Component Value Date/Time   PHART 7.344* 11/22/2015 2123   HCO3 18.5* 11/22/2015 2123   TCO2 22 11/23/2015 1733   ACIDBASEDEF 6.0* 11/22/2015 2123   O2SAT 96.0 11/22/2015  2123   CBG (last 3)   Recent Labs  11/26/15 1658 11/26/15 2110 11/27/15 0725  GLUCAP 188* 130* 115*    Assessment/Plan: S/P Procedure(s) (LRB): CORONARY ARTERY BYPASS GRAFTING (CABG) times five using the left internal mammary, right greater saphenous vein EVH, and left thigh greater saphenous vein EVH (N/A) TRANSESOPHAGEAL ECHOCARDIOGRAM (TEE) (N/A) Mobilize Diabetes control d/c pacing wires Plan for discharge: see discharge orders  Transition to oral diabetic medications he will resume at home   LOS: 6 days    Tharon Aquas Trigt III 11/27/2015

## 2015-11-27 NOTE — Discharge Instructions (Signed)
Coronary Artery Bypass Grafting, Care After °These instructions give you information on caring for yourself after your procedure. Your doctor may also give you more specific instructions. Call your doctor if you have any problems or questions after your procedure.  °HOME CARE °· Only take medicine as told by your doctor. Take medicines exactly as told. Do not stop taking medicines or start any new medicines without talking to your doctor first. °· Take your pulse as told by your doctor. °· Do deep breathing as told by your doctor. Use your breathing device (incentive spirometer), if given, to practice deep breathing several times a [place]. Support your chest with a pillow or your arms when you take deep breaths or cough. °· Keep the area clean, dry, and protected where the surgery cuts (incisions) were made. Remove bandages (dressings) only as told by your doctor. If strips were applied to surgical area, do not take them off. They fall off on their own. °· Check the surgery area daily for puffiness (swelling), redness, or leaking fluid. °· If surgery cuts were made in your legs: °¨ Avoid crossing your legs. °¨ Avoid sitting for long periods of time. Change positions every 30 minutes. °¨ Raise your legs when you are sitting. Place them on pillows. °· Wear stockings that help keep blood clots from forming in your legs (compression stockings). °· Only take sponge baths until your doctor says it is okay to take showers. Pat the surgery area dry. Do not rub the surgery area with a washcloth or towel. Do not bathe, swim, or use a hot tub until your doctor says it is okay. °· Eat foods that are high in fiber. These include raw fruits and vegetables, whole grains, beans, and nuts. Choose lean meats. Avoid canned, processed, and fried foods. °· Drink enough fluids to keep your pee (urine) clear or pale yellow. °· Weigh yourself every [place]. °· Rest and limit activity as told by your doctor. You may be told to: °¨ Stop any  activity if you have chest pain, shortness of breath, changes in heartbeat, or dizziness. Get help right away if this happens. °¨ Move around often for short amounts of time or take short walks as told by your doctor. Gradually become more active. You may need help to strengthen your muscles and build endurance. °¨ Avoid lifting, pushing, or pulling anything heavier than 10 pounds (4.5 kg) for at least 6 weeks after surgery. °· Do not drive until your doctor says it is okay. °· Ask your doctor when you can go back to work. °· Ask your doctor when you can begin sexual activity again. °· Follow up with your doctor as told. °GET HELP IF: °· You have puffiness, redness, more pain, or fluid draining from the incision site. °· You have a fever. °· You have puffiness in your ankles or legs. °· You have pain in your legs. °· You gain 2 or more pounds (0.9 kg) a [place]. °· You feel sick to your stomach (nauseous) or throw up (vomit). °· You have watery poop (diarrhea). °GET HELP RIGHT AWAY IF: °· You have chest pain that goes to your jaw or arms. °· You have shortness of breath. °· You have a fast or irregular heartbeat. °· You notice a "clicking" in your breastbone when you move. °· You have numbness or weakness in your arms or legs. °· You feel dizzy or light-headed. °MAKE SURE YOU: °· Understand these instructions. °· Will watch your condition. °· Will get   help right away if you are not doing well or get worse.   This information is not intended to replace advice given to you by your health care provider. Make sure you discuss any questions you have with your health care provider.   Document Released: 09/20/2013 Document Reviewed: 09/20/2013 Elsevier Interactive Patient Education 2016 [place]. Endoscopic Saphenous Vein Harvesting, Care After Refer to this sheet in the next few weeks. These instructions provide you with information on caring for yourself after your procedure. Your health care provider may also  give you more specific instructions. Your treatment has been planned according to current medical practices, but problems sometimes occur. Call your health care provider if you have any problems or questions after your procedure. HOME CARE INSTRUCTIONS Medicine  Take whatever pain medicine your surgeon prescribes. Follow the directions carefully. Do not take over-the-counter pain medicine unless your surgeon says it is okay. Some pain medicine can cause bleeding problems for several weeks after surgery.  Follow your surgeon's instructions about driving. You will probably not be permitted to drive after heart surgery.  Take any medicines your surgeon prescribes. Any medicines you took before your heart surgery should be checked with your health care provider before you start taking them again. Wound care  If your surgeon has prescribed an elastic bandage or stocking, ask how long you should wear it.  Check the area around your surgical cuts (incisions) whenever your bandages (dressings) are changed. Look for any redness or swelling.  You will need to return to have the stitches (sutures) or staples taken out. Ask your surgeon when to do that.  Ask your surgeon when you can shower or bathe. Activity  Try to keep your legs raised when you are sitting.  Do any exercises your health care providers have given you. These may include deep breathing exercises, coughing, walking, or other exercises. SEEK MEDICAL CARE IF:  You have any questions about your medicines.  You have more leg pain, especially if your pain medicine stops working.  New or growing bruises develop on your leg.  Your leg swells, feels tight, or becomes red.  You have numbness in your leg. SEEK IMMEDIATE MEDICAL CARE IF:  Your pain gets much worse.  Blood or fluid leaks from any of the incisions.  Your incisions become warm, swollen, or red.  You have chest pain.  You have trouble breathing.  You have a  fever.  You have more pain near your leg incision. MAKE SURE YOU:  Understand these instructions.  Will watch your condition.  Will get help right away if you are not doing well or get worse.   This information is not intended to replace advice given to you by your health care provider. Make sure you discuss any questions you have with your health care provider.   Document Released: 05/28/2011 Document Revised: 10/06/2014 Document Reviewed: 05/28/2011 Elsevier Interactive Patient Education [employer].

## 2015-11-27 NOTE — Significant Event (Signed)
Discharge instruction reviewed with patient thoroughly. Given a copy of AVS to patient and prescription papers. All personal belongings with patient. Patient waiting for spouse for transportation to home. Atoya Andrew, Therapist, sports.

## 2015-11-27 NOTE — Progress Notes (Addendum)
Cardiac Rehab [phone] Pt has been ambulating on unit without any difficulty. Completed discharge education with pt. We discussed sternal precaution, exercise guidelines, diabetic diet and Outpt. CRP. He voices understanding. Referral sent to Outpt. CRP in [place]. Placed recovery from heart surgery video for pt to watch. Deon Pilling, RN 11/27/2015 11:16 AM

## 2015-12-03 ENCOUNTER — Telehealth: Payer: Self-pay | Admitting: Endocrinology

## 2015-12-03 DIAGNOSIS — Z736 Limitation of activities due to disability: Secondary | ICD-10-CM

## 2015-12-03 NOTE — Telephone Encounter (Signed)
See note below and please advise, Thanks! 

## 2015-12-03 NOTE — Telephone Encounter (Signed)
Pt called and said that his insurance will no longer cover Actos and wants to know if there is something else that he can be prescribed.

## 2015-12-03 NOTE — Telephone Encounter (Signed)
D/c actos Ov next week

## 2015-12-03 NOTE — Telephone Encounter (Signed)
Pt advised of note below and voiced understanding. Pt scheduled for 12/11/2015 at 145 pm.

## 2015-12-04 ENCOUNTER — Encounter (INDEPENDENT_AMBULATORY_CARE_PROVIDER_SITE_OTHER): Payer: Self-pay

## 2015-12-04 DIAGNOSIS — Z951 Presence of aortocoronary bypass graft: Secondary | ICD-10-CM

## 2015-12-05 ENCOUNTER — Emergency Department (HOSPITAL_COMMUNITY): Payer: BLUE CROSS/BLUE SHIELD

## 2015-12-05 ENCOUNTER — Encounter (HOSPITAL_COMMUNITY): Payer: Self-pay | Admitting: *Deleted

## 2015-12-05 ENCOUNTER — Observation Stay (HOSPITAL_COMMUNITY)
Admission: EM | Admit: 2015-12-05 | Discharge: 2015-12-06 | Disposition: A | Discharge status: Home / Self Care | Payer: BLUE CROSS/BLUE SHIELD | Attending: Cardiology | Admitting: Cardiology

## 2015-12-05 DIAGNOSIS — I25709 Atherosclerosis of coronary artery bypass graft(s), unspecified, with unspecified angina pectoris: Secondary | ICD-10-CM | POA: Diagnosis present

## 2015-12-05 DIAGNOSIS — R072 Precordial pain: Secondary | ICD-10-CM | POA: Diagnosis not present

## 2015-12-05 DIAGNOSIS — E119 Type 2 diabetes mellitus without complications: Secondary | ICD-10-CM | POA: Diagnosis not present

## 2015-12-05 DIAGNOSIS — Z7982 Long term (current) use of aspirin: Secondary | ICD-10-CM | POA: Diagnosis not present

## 2015-12-05 DIAGNOSIS — Z79899 Other long term (current) drug therapy: Secondary | ICD-10-CM | POA: Insufficient documentation

## 2015-12-05 DIAGNOSIS — E7800 Pure hypercholesterolemia, unspecified: Secondary | ICD-10-CM | POA: Diagnosis not present

## 2015-12-05 DIAGNOSIS — I959 Hypotension, unspecified: Secondary | ICD-10-CM

## 2015-12-05 DIAGNOSIS — I2510 Atherosclerotic heart disease of native coronary artery without angina pectoris: Secondary | ICD-10-CM | POA: Insufficient documentation

## 2015-12-05 DIAGNOSIS — I10 Essential (primary) hypertension: Secondary | ICD-10-CM | POA: Diagnosis not present

## 2015-12-05 DIAGNOSIS — Z951 Presence of aortocoronary bypass graft: Secondary | ICD-10-CM | POA: Diagnosis not present

## 2015-12-05 DIAGNOSIS — Z7984 Long term (current) use of oral hypoglycemic drugs: Secondary | ICD-10-CM | POA: Diagnosis not present

## 2015-12-05 DIAGNOSIS — Z87891 Personal history of nicotine dependence: Secondary | ICD-10-CM | POA: Diagnosis not present

## 2015-12-05 DIAGNOSIS — I25708 Atherosclerosis of coronary artery bypass graft(s), unspecified, with other forms of angina pectoris: Secondary | ICD-10-CM | POA: Diagnosis present

## 2015-12-05 DIAGNOSIS — E785 Hyperlipidemia, unspecified: Secondary | ICD-10-CM | POA: Diagnosis present

## 2015-12-05 LAB — LIPASE, BLOOD: Lipase: 38 U/L (ref 11–51)

## 2015-12-05 LAB — I-STAT CG4 LACTIC ACID, ED
LACTIC ACID, VENOUS: 2.34 mmol/L — AB (ref 0.5–2.0)
Lactic Acid, Venous: 2.35 mmol/L (ref 0.5–2.0)

## 2015-12-05 LAB — I-STAT CHEM 8, ED
BUN: 17 mg/dL (ref 6–20)
CALCIUM ION: 1.21 mmol/L (ref 1.13–1.30)
CHLORIDE: 105 mmol/L (ref 101–111)
Creatinine, Ser: 1.1 mg/dL (ref 0.61–1.24)
Glucose, Bld: 119 mg/dL — ABNORMAL HIGH (ref 65–99)
HEMATOCRIT: 28 % — AB (ref 39.0–52.0)
Hemoglobin: 9.5 g/dL — ABNORMAL LOW (ref 13.0–17.0)
Potassium: 4.5 mmol/L (ref 3.5–5.1)
SODIUM: 141 mmol/L (ref 135–145)
TCO2: 24 mmol/L (ref 0–100)

## 2015-12-05 LAB — CBC WITH DIFFERENTIAL/PLATELET
BASOS PCT: 0 %
Basophils Absolute: 0 10*3/uL (ref 0.0–0.1)
EOS ABS: 0.1 10*3/uL (ref 0.0–0.7)
EOS PCT: 2 %
HCT: 28 % — ABNORMAL LOW (ref 39.0–52.0)
Hemoglobin: 9.4 g/dL — ABNORMAL LOW (ref 13.0–17.0)
LYMPHS ABS: 1.4 10*3/uL (ref 0.7–4.0)
Lymphocytes Relative: 22 %
MCH: 29.6 pg (ref 26.0–34.0)
MCHC: 33.6 g/dL (ref 30.0–36.0)
MCV: 88.1 fL (ref 78.0–100.0)
MONO ABS: 0.4 10*3/uL (ref 0.1–1.0)
MONOS PCT: 6 %
NEUTROS PCT: 70 %
Neutro Abs: 4.4 10*3/uL (ref 1.7–7.7)
PLATELETS: 303 10*3/uL (ref 150–400)
RBC: 3.18 MIL/uL — ABNORMAL LOW (ref 4.22–5.81)
RDW: 12.7 % (ref 11.5–15.5)
WBC: 6.4 10*3/uL (ref 4.0–10.5)

## 2015-12-05 LAB — COMPREHENSIVE METABOLIC PANEL
ALBUMIN: 3.2 g/dL — AB (ref 3.5–5.0)
ALK PHOS: 48 U/L (ref 38–126)
ALT: 38 U/L (ref 17–63)
ANION GAP: 15 (ref 5–15)
AST: 24 U/L (ref 15–41)
BUN: 15 mg/dL (ref 6–20)
CALCIUM: 9.5 mg/dL (ref 8.9–10.3)
CHLORIDE: 104 mmol/L (ref 101–111)
CO2: 21 mmol/L — AB (ref 22–32)
Creatinine, Ser: 1.16 mg/dL (ref 0.61–1.24)
GFR calc non Af Amer: >60 mL/min (ref >60)
GLUCOSE: 125 mg/dL — AB (ref 65–99)
POTASSIUM: 4.5 mmol/L (ref 3.5–5.1)
SODIUM: 140 mmol/L (ref 135–145)
Total Bilirubin: 0.4 mg/dL (ref 0.3–1.2)
Total Protein: 7 g/dL (ref 6.5–8.1)

## 2015-12-05 LAB — GLUCOSE, CAPILLARY: Glucose-Capillary: 127 mg/dL — ABNORMAL HIGH (ref 65–99)

## 2015-12-05 LAB — TROPONIN I: Troponin I: <0.03 ng/mL (ref <0.031)

## 2015-12-05 LAB — I-STAT TROPONIN, ED
TROPONIN I, POC: 0.02 ng/mL (ref 0.00–0.08)
Troponin i, poc: 0.02 ng/mL (ref 0.00–0.08)

## 2015-12-05 LAB — BRAIN NATRIURETIC PEPTIDE: B NATRIURETIC PEPTIDE 5: 204.4 pg/mL — AB (ref 0.0–100.0)

## 2015-12-05 LAB — AMYLASE: Amylase: 67 U/L (ref 28–100)

## 2015-12-05 LAB — MAGNESIUM: Magnesium: 2 mg/dL (ref 1.7–2.4)

## 2015-12-05 MED ORDER — INSULIN ASPART 100 UNIT/ML ~~LOC~~ SOLN
0.0000 [IU] | Freq: Three times a day (TID) | SUBCUTANEOUS | Status: DC
  Administered 2015-12-06 (×2): 2 [IU] via SUBCUTANEOUS

## 2015-12-05 MED ORDER — ASPIRIN EC 325 MG PO TBEC
325.0000 mg | DELAYED_RELEASE_TABLET | Freq: Every day | ORAL | Status: DC
  Administered 2015-12-06: 325 mg via ORAL
  Filled 2015-12-05: qty 1

## 2015-12-05 MED ORDER — ONDANSETRON HCL 4 MG/2ML IJ SOLN: 4.0000 mg | Freq: Four times a day (QID) | INTRAMUSCULAR | Status: DC | PRN

## 2015-12-05 MED ORDER — SODIUM CHLORIDE 0.9 % IV BOLUS (SEPSIS)
1000.0000 mL | Freq: Once | INTRAVENOUS | Status: AC
  Administered 2015-12-05: 1000 mL via INTRAVENOUS

## 2015-12-05 MED ORDER — BROMOCRIPTINE MESYLATE 2.5 MG PO TABS
1.2500 mg | ORAL_TABLET | Freq: Every day | ORAL | Status: DC
  Administered 2015-12-06: 1.25 mg via ORAL
  Filled 2015-12-05: qty 1

## 2015-12-05 MED ORDER — METOPROLOL TARTRATE 25 MG PO TABS
25.0000 mg | ORAL_TABLET | Freq: Two times a day (BID) | ORAL | Status: DC
  Administered 2015-12-05: 25 mg via ORAL
  Filled 2015-12-05: qty 1

## 2015-12-05 MED ORDER — ENOXAPARIN SODIUM 40 MG/0.4ML ~~LOC~~ SOLN
40.0000 mg | SUBCUTANEOUS | Status: DC
  Administered 2015-12-05: 40 mg via SUBCUTANEOUS
  Filled 2015-12-05: qty 0.4

## 2015-12-05 MED ORDER — ACETAMINOPHEN 325 MG PO TABS
650.0000 mg | ORAL_TABLET | ORAL | Status: DC | PRN
  Administered 2015-12-05 – 2015-12-06 (×2): 650 mg via ORAL
  Filled 2015-12-05 (×2): qty 2

## 2015-12-05 MED ORDER — ROSUVASTATIN CALCIUM 10 MG PO TABS
40.0000 mg | ORAL_TABLET | Freq: Every day | ORAL | Status: DC
  Administered 2015-12-06: 40 mg via ORAL
  Filled 2015-12-05: qty 4

## 2015-12-05 MED ORDER — NITROGLYCERIN 0.4 MG SL SUBL: 0.4000 mg | SUBLINGUAL_TABLET | SUBLINGUAL | Status: DC | PRN

## 2015-12-05 MED ORDER — IOHEXOL 350 MG/ML SOLN
100.0000 mL | Freq: Once | INTRAVENOUS | Status: AC | PRN
  Administered 2015-12-05: 100 mL via INTRAVENOUS

## 2015-12-05 NOTE — ED Notes (Signed)
Per EMS Pt had a CABG a week ago, Pt began to have CP today that he said was a crushing and pressure. Pain was rated 10 out of 10.  Pt took 324mg  of ASA and 2 nitro prior to EMS arrival.  Pt was given 200cc of normal saline in route.  Pain was relieved by the nitro.  Vital signs are as follows: BP: 77/49 HR: 83 CBG:121

## 2015-12-05 NOTE — ED Notes (Signed)
The pt is alert no pain  He just feels bad.  C/o being cold  Covered with blankets

## 2015-12-05 NOTE — ED Notes (Signed)
Dr Stanford Breed at the bedside

## 2015-12-05 NOTE — ED Notes (Signed)
Blood drawn.

## 2015-12-05 NOTE — H&P (Signed)
Cardiology History and Physical    Patient ID: Jerome Vaughn MRN: [id number], DOB/AGE: [birth year]   Admit date: 12/05/2015 Date of Admission: 12/05/2015  Primary Physician: [name], MD Primary Cardiologist: Dr. Mare Ferrari - Now Dr. [name]   History of Present Illness    Jerome Vaughn is a 65 y.o. male with past medical history of CAD (recent CABG on 11/21/2015 with LIMA-LAD, SVG-Diag, SVG-OM1 and distal Cx, and SVG-PDA), Type 2 DM, HTN, and HLD who presented to [facility] ED on 12/05/2015 for evaluation of chest pain.   He was initially seen by Richardson Dopp, PA-C for an ETT on 11/14/2015 which showed significant ST depression. A cardiac catheterization was recommended and performed on 11/21/2015. It showed multivessel CAD with CTO of the mid-RCA, distal RCA with collaterals from the LCx, 70% stenosis in the Left Main, 90% stenosis in the LAD, and 70% stenosis in the OM. CABG was recommended and this was performed on 11/22/2015 with the LIMA-LAD, SVG-Diag, SVG-OM1 and distal Cx, and SVG-PDA. He tolerated the procedure well and had no significant complications following surgery. He was discharged in stable condition on 11/27/2015.  He presents with chest pain which started earlier this morning. He describes the pain as a pressure and rates it as a 10/10. It is present along his left pectoral region and epigastrium, associated with nausea. He was given SL NTG and reported significant improvement in his pain after the second dose. He says this pain is different from what he experienced after surgery, saying his chest was tender to touch for the week following surgery but this is completely different. Also says this pain is different from his previous angina leading to his initial cardiac workup, for he was having chest pain and dyspnea with    While in the ED, his initial troponin has been negative. WBC 6.4. Hgb 9.4. Platelets 303. Electrolytes without significant abnormalities. Creatinine 1.16. Mg 2.0. BNP  204. Lactic Acid 2.34. EKG shows NSR, HR 78. TWI in V1 and V2 with slight ST elevation in inferior leads.    Past Medical History   Past Medical History  Diagnosis Date  . Diabetes mellitus   . Hyperlipidemia   . Hypertension   . Carpal tunnel syndrome     both hands  . Coronary artery disease     a. Cath 11/21/2015: Multivessel CAD --> CABG recommended. b. CABG on 11/22/2015:  LIMA-LAD, SVG-Diag, SVG-OM1 and distal Cx, and SVG-PDA.    Past Surgical History  Procedure Laterality Date  . Wrist ganglion excision    . Frozen shoulder release    . Colonsocopy  2012  . Cardiac catheterization N/A 11/21/2015    Procedure: Left Heart Cath and Coronary Angiography;  Surgeon: [name], MD;  Location: [facility];  Service: Cardiovascular;  Laterality: N/A;  . Coronary artery bypass graft N/A 11/22/2015    Procedure: CORONARY ARTERY BYPASS GRAFTING (CABG) times five using the left internal mammary, right greater saphenous vein EVH, and left thigh greater saphenous vein EVH;  Surgeon: [name], MD;  Location: [place];  Service: Open Heart Surgery;  Laterality: N/A;  . Tee without cardioversion N/A 11/22/2015    Procedure: TRANSESOPHAGEAL ECHOCARDIOGRAM (TEE);  Surgeon: [name], MD;  Location: [place];  Service: Open Heart Surgery;  Laterality: N/A;     Allergies: No Known Allergies  Inpatient Medications      Family History    Family History  Problem Relation Age of Onset  .  Hyperlipidemia Sister   . Diabetes Sister   . Hypertension Sister   . Anuerysm Brother   . Diabetes Maternal Grandmother   . Hyperlipidemia Sister   . Kidney disease Sister   . Heart attack Neg Hx     Social History    Social History   Social History  . Marital Status: Married    Spouse Name: N/A  . Number of Children: 5  . Years of Education: N/A   Occupational History  . Dispatcher    Social History Main Topics  . Smoking status: Former Smoker -- 0.25 packs/[place] for 20 years     Types: Cigarettes    Quit date: 10/07/2013  . Smokeless tobacco: Never Used     Comment: quit cigarettes, might have a cigar 1 X month  . Alcohol Use: 1.8 oz/week    3 Standard drinks or equivalent per week  . Drug Use: No  . Sexual Activity: Not on file   Other Topics Concern  . 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  Cardiovascular:  No dyspnea on exertion, edema, orthopnea, palpitations, paroxysmal nocturnal dyspnea. Positive for chest pain. Dermatological: No rash, lesions/masses Respiratory: No cough, dyspnea Urologic: No hematuria, dysuria Abdominal:   No vomiting, diarrhea, bright red blood per rectum, melena, or hematemesis. Positive for nausea. Neurologic:  No visual changes, wkns, changes in mental status. All other systems reviewed and are otherwise negative except as noted above.  Physical Exam    Blood pressure 93/59, pulse 85, temperature 98.3 F (36.8 C), resp. rate 23, height 5' 7.5" (1.715 m), weight 189 lb (85.73 kg), SpO2 99 %.  General: Pleasant, African American male appearing in NAD. Psych: Normal affect. Neuro: Alert and oriented X 3. Moves all extremities spontaneously. HEENT: Normal  Neck: Supple without bruits or JVD. Lungs:  Resp regular and unlabored, CTA without wheezing or rales. Heart: RRR no s3, s4, or murmurs. Well-healing sternal incision. Abdomen: Soft, non-tender, non-distended, BS + x 4.  Extremities: No clubbing, cyanosis or edema. DP/PT/Radials 2+ and equal bilaterally.  Labs    Troponin [facility] Test)  Recent Labs  12/05/15 1404  TROPIPOC 0.02   No results for input(s): CKTOTAL, CKMB, TROPONINI in the last 72 hours. Lab Results  Component Value Date   WBC 6.4 12/05/2015   HGB 9.5*  12/05/2015   HCT 28.0* 12/05/2015   MCV 88.1 12/05/2015   PLT 303 12/05/2015     Recent Labs Lab 12/05/15 1404 12/05/15 1405  NA 140 141  K 4.5 4.5  CL 104 105  CO2 21*  --   BUN 15 17  CREATININE 1.16 1.10  CALCIUM 9.5  --   PROT 7.0  --   BILITOT 0.4  --   ALKPHOS 48  --   ALT 38  --   AST 24  --   GLUCOSE 125* 119*   Lab Results  Component Value Date   CHOL 169 08/22/2014   HDL 51.80 08/22/2014   LDLCALC 88 08/22/2014   TRIG 146.0 08/22/2014     Radiology Studies     CT SCAN: 11/21/2015 EXAM: CT ANGIOGRAPHY CHEST WITH CONTRAST  TECHNIQUE: Multidetector CT imaging of the chest was  performed using the standard protocol during bolus administration of intravenous contrast. Multiplanar CT image reconstructions and MIPs were obtained to evaluate the vascular anatomy.  CONTRAST: 100 mL OMNIPAQUE IOHEXOL 350 MG/ML SOLN  COMPARISON: PA and lateral chest 11/25/2015 in 03/01/2009.  FINDINGS: No pulmonary embolus is identified. The patient is status post CABG. There is cardiomegaly. Small left pleural effusion is identified. There is a small to moderate pericardial effusion. No axillary, hilar or mediastinal lymphadenopathy. The lungs demonstrate dependent basilar atelectasis bilaterally, worse on the left. There is no pneumothorax.  Visualized upper abdomen shows no focal abnormality. No focal bony abnormality is identified.  Review of the MIP images confirms the above findings.  IMPRESSION: Negative for pulmonary embolus.  Small left and small to moderate pericardial effusion.  Dependent bilateral atelectasis, worse on the left.  Cardiomegaly without evidence of edema.  EKG & Cardiac Imaging    EKG: NSR, HR 78. TWI in V1 and V2. Slight ST elevation in inferior leads.   Cardiac Catheterization: 11/21/2015 1. Mid RCA lesion, 100% stenosed. 2. LM-1 lesion, 70% stenosed. 3. LM-2 lesion, 90% stenosed. 4. Prox LAD lesion, 85%  stenosed. 5. 1st Diag lesion, 100% stenosed. 6. Ost 1st Mrg to 1st Mrg lesion, 75% stenosed. 7. Ost 2nd Mrg to 2nd Mrg lesion, 65% stenosed. 8. Mid RCA to Dist RCA lesion, 100% stenosed.   Severe multivessel coronary disease with chronic total occlusion of the mid RCA, distal RCA collateralized from the left circumflex, 70% distal left main, segmental 90% ostial to proximal LAD with superimposed saccular aneurysm, 80% mid LAD, and 70% tandem stenoses in the first obtuse marginal. A branch of the first diagonal is totally occluded  Mild mid anterior wall hypokinesis. EF is estimated to be 55%. EDP was normal.  RECOMMENDATIONS:   Early positive exercise treadmill test, significant component of silent ischemia, and diabetes mellitus type 2 suggest revascularization as soon as possible. For that reason he will be admitted. And anginal therapy will be titrated.  TCTS consultation   Assessment & Plan    1. Chest Pain/ History of CAD - had recent CABG on 11/21/2015 with LIMA-LAD, SVG-Diag, SVG-OM1 and distal Cx, and SVG-PDA. No complications noted prior to discharge on 11/27/2015.  - presents with chest pressure which started around noon today, located along his left pectoral region and epigastrium. Associated with nausea and relived with SL NTG. Reports the pain is different from his post-surgical pain.  - initial troponin has been negative. EKG shows NSR, HR 78. TWI in V1 and V2 with slight ST elevation in inferior leads.  - will check LFT's, Amylase, and Lipase due to his epigastric pain. - will obtain repeat EKG. Cycle cardiac enzymes. - Will obtain echocardiogram to further evaluate his pericardial effusion. - continue ASA, statin, BB (hold parameters in the setting of his hypotension). Will hold ACE-I with current hypotension.  2. HTN - BP has been 81/56 - 96/63 while in the ED. Reports his SBP was in the 120's this morning prior to administration of SL NTG. - will hold ACE-I in  setting of current hypotension.  3. HLD - continue statin therapy.  4. Type 2 DM - hold Metformin and oral anti-glycemic medications. - SSI while admitted.  Signed, Erma Heritage, PA-C 12/05/2015, 3:49 PM Pager: [phone] As above, patient seen and examined. Briefly he is a 65 year old male with past medical history of diabetes mellitus, hyperlipidemia, coronary artery disease status post recent coronary artery bypass graft with chest pain. Patient underwent coronary  artery bypass graft on 11/21/2015. He has done well since discharge. He denies dyspnea on exertion. He has had residual chest pain from his sternotomy but otherwise without complaints. Today he developed substernal chest tightness similar to his pain prior to bypass surgery. However it did radiate to his epigastric area. There was mild nausea but no dyspnea or diaphoresis. The patient took one sublingual nitroglycerin without resolution. He took a second and dizziness and hypotension. EMS was called. He took an aspirin and his pain resolved after 25 minutes. Presently pain-free. Electrocardiogram Shows sinus rhythm with nonspecific ST changes. Initial troponin normal.BNP 204. Chest CT shows no pulmonary embolus.There is a small left pleural effusion and small to moderate pericardial effusion. Quick look bedside echo was technically difficult but I did not see a large pericardial effusion. 1 chest pain-etiology of symptoms unclear. He says it is similar to his pain prior to his bypass surgery. However it did radiate to his epigastric area which was different. Electrocardiogram nondiagnostic. Initial enzymes negative. No pulmonary embolus on CT. Plan to admit to telemetry and cycle enzymes. Check echocardiogram for LV function and to rule out pericardial effusion. Check liver functions, amylase and lipase. Note he does not have right upper quadrant tenderness on examination. If above negative will likely ambulate patient to see if he  has recurrent symptoms. If not we'll likely treat medically. 2 coronary artery disease-continue aspirin and statin. 3 hypertension-patient's blood pressure was low on admission. However this was in the setting of 2 sublingual nitroglycerin. Hold lisinopril. Reassess based on follow-up blood pressures. 4 diabetes mellitus-follow CBGs. Kirk Ruths

## 2015-12-05 NOTE — ED Notes (Signed)
i attempted report  Still in floor report

## 2015-12-05 NOTE — ED Notes (Signed)
Labs delayed  Doctors at the beside

## 2015-12-05 NOTE — ED Notes (Signed)
thye pt seems tio be more assured with all the medical staff that has been with him

## 2015-12-05 NOTE — ED Provider Notes (Signed)
CSN: [id number]     Arrival date & time 12/05/15  1336 History   First MD Initiated Contact with Patient 12/05/15 1336     Chief Complaint  Patient presents with  . Chest Pain     (Consider location/radiation/quality/duration/timing/severity/associated sxs/prior Treatment) HPI Comments: Chest pain also on right side worse with deep breaths and sharp  Patient is a 65 y.o. male presenting with chest pain.  Chest Pain Pain location:  Substernal area Pain quality: burning and tightness   Pain radiates to:  Epigastrium Pain radiates to the back: no   Pain severity:  Severe Onset quality:  Sudden Duration:  1 hour Timing:  Constant Progression:  Partially resolved Chronicity:  New Relieved by:  Nitroglycerin Associated symptoms: diaphoresis, fatigue and nausea   Associated symptoms: no abdominal pain, no back pain, no cough, no fever, no headache, no numbness, no shortness of breath, no syncope, not vomiting and no weakness   Risk factors: coronary artery disease, high cholesterol, hypertension and surgery   Risk factors: no diabetes mellitus, no prior DVT/PE and no smoking     Past Medical History  Diagnosis Date  . Diabetes mellitus   . Hyperlipidemia   . Hypertension   . Carpal tunnel syndrome     both hands  . Coronary artery disease     a. Cath 11/21/2015: Multivessel CAD --> CABG recommended. b. CABG on 11/22/2015:  LIMA-LAD, SVG-Diag, SVG-OM1 and distal Cx, and SVG-PDA.   Past Surgical History  Procedure Laterality Date  . Wrist ganglion excision    . Frozen shoulder release    . Colonsocopy  2012  . Cardiac catheterization N/A 11/21/2015    Procedure: Left Heart Cath and Coronary Angiography;  Surgeon: [name], MD;  Location: [place];  Service: Cardiovascular;  Laterality: N/A;  . Coronary artery bypass graft N/A 11/22/2015    Procedure: CORONARY ARTERY BYPASS GRAFTING (CABG) times five using the left internal mammary, right greater saphenous vein EVH,  and left thigh greater saphenous vein EVH;  Surgeon: [name], MD;  Location: [place];  Service: Open Heart Surgery;  Laterality: N/A;  . Tee without cardioversion N/A 11/22/2015    Procedure: TRANSESOPHAGEAL ECHOCARDIOGRAM (TEE);  Surgeon: [name], MD;  Location: [facility];  Service: Open Heart Surgery;  Laterality: N/A;   Family History  Problem Relation Age of Onset  . Hyperlipidemia Sister   . Diabetes Sister   . Hypertension Sister   . Anuerysm Brother   . Diabetes Maternal Grandmother   . Hyperlipidemia Sister   . Kidney disease Sister   . Heart attack Neg Hx    Social History  Substance Use Topics  . Smoking status: Former Smoker -- 0.25 packs/[place] for 20 years    Types: Cigarettes    Quit date: 10/07/2013  . Smokeless tobacco: Never Used     Comment: quit cigarettes, might have a cigar 1 X month  . Alcohol Use: 1.8 oz/week    3 Standard drinks or equivalent per week    Review of Systems  Constitutional: Positive for diaphoresis and fatigue. Negative for fever.  HENT: Negative for sore throat.   Eyes: Negative for visual disturbance.  Respiratory: Negative for cough and shortness of breath.   Cardiovascular: Positive for chest pain. Negative for syncope.  Gastrointestinal: Positive for nausea. Negative for vomiting, abdominal pain, diarrhea and constipation.  Genitourinary: Negative for difficulty urinating.  Musculoskeletal: Negative for back pain and neck stiffness.  Skin: Negative for rash.  Neurological: Negative for syncope, weakness, numbness and headaches.      Allergies  Review of patient's allergies indicates no known allergies.  Home Medications   Prior to Admission medications   Medication Sig Start Date End Date Taking? Authorizing Provider  aspirin EC 325 MG EC tablet Take 1 tablet (325 mg total) by mouth daily. 11/27/15  Yes Wayne E Gold, PA-C  benazepril (LOTENSIN) 20 MG tablet Take 20 mg by mouth daily.   Yes Historical Provider, MD   Blood Glucose Monitoring Suppl (ACCU-CHEK AVIVA PLUS) W/DEVICE KIT Test once per [place] 08/28/14  Yes [name], MD  bromocriptine (PARLODEL) 2.5 MG tablet Take 0.5 tablets (1.25 mg total) by mouth at bedtime. Patient taking differently: Take 1.25 mg by mouth daily.  01/09/15  Yes [name], MD  canagliflozin (INVOKANA) 300 MG TABS tablet Take 300 mg by mouth daily before breakfast. 03/28/15  Yes [name], MD  CINNAMON PO Take 2,000 mg by mouth daily.    Yes Historical Provider, MD  glipiZIDE (GLUCOTROL XL) 2.5 MG 24 hr tablet Take 1 tablet (2.5 mg total) by mouth daily with breakfast. 01/09/15  Yes [name], MD  glucose blood (ACCU-CHEK AVIVA PLUS) test strip Diagnosis code is E 11.9 08/28/14  Yes [name], MD  Lancets (ACCU-CHEK SOFT TOUCH) lancets Dispense for Aviva plus and diagnosis code is E 11.9 08/28/14  Yes [name], MD  metFORMIN (GLUCOPHAGE-XR) 500 MG 24 hr tablet Take 4 tablets by mouth  daily with breakfast 01/09/15  Yes [name], MD  metoprolol tartrate (LOPRESSOR) 25 MG tablet Take 1 tablet (25 mg total) by mouth 2 (two) times daily. 11/27/15  Yes Wayne E Gold, PA-C  Multiple Vitamin (MULTIVITAMIN) tablet Take 1 tablet by mouth daily.     Yes Historical Provider, MD  Omega-3 Fatty Acids (FISH OIL) 1000 MG CAPS Take 1,000 mg by mouth daily.    Yes Historical Provider, MD  oxyCODONE (OXY IR/ROXICODONE) 5 MG immediate release tablet Take 1-2 tablets (5-10 mg total) by mouth every 4 (four) hours as needed for severe pain. 11/27/15  Yes Wayne E Gold, PA-C  rosuvastatin (CRESTOR) 40 MG tablet Take 40 mg by mouth daily.   Yes Historical Provider, MD  pioglitazone (ACTOS) 45 MG tablet Take 1 tablet by mouth  daily Patient not taking: Reported on 12/05/2015 11/09/15   [name], MD   BP 98/65 mmHg  Pulse 82  Temp(Src) 98.3 F (36.8 C)  Resp 20  Ht 5' 7.5" (1.715 m)  Wt 189 lb (85.73 kg)  BMI 29.15 kg/m2  SpO2 98% Physical Exam  Constitutional: He is oriented to  person, place, and time. He appears well-developed and well-nourished. No distress.  HENT:  Head: Normocephalic and atraumatic.  Eyes: Conjunctivae and EOM are normal.  Neck: Normal range of motion. No JVD present.  Cardiovascular: Normal rate, regular rhythm, normal heart sounds and intact distal pulses.  Exam reveals no gallop and no friction rub.   No murmur heard. Pulmonary/Chest: Effort normal and breath sounds normal. No respiratory distress. He has no wheezes. He has no rales.  Abdominal: Soft. He exhibits no distension. There is no tenderness. There is no guarding.  Musculoskeletal: He exhibits edema (right greater than left, right leg with tenderness, incisions c/d/i).  Neurological: He is alert and oriented to person, place, and time.  Skin: Skin is warm and dry. He is not diaphoretic.  Incisions right lower extremity C/D/I Incision left lower ext c/d/i, no sign  of erythema Sternotomy and epigastric incision C/D/I, no erythema   Nursing note and vitals reviewed.   ED Course  Procedures (including critical care time) Labs Review Labs Reviewed  CBC WITH DIFFERENTIAL/PLATELET - Abnormal; Notable for the following:    RBC 3.18 (*)    Hemoglobin 9.4 (*)    HCT 28.0 (*)    All other components within normal limits  COMPREHENSIVE METABOLIC PANEL - Abnormal; Notable for the following:    CO2 21 (*)    Glucose, Bld 125 (*)    Albumin 3.2 (*)    All other components within normal limits  BRAIN NATRIURETIC PEPTIDE - Abnormal; Notable for the following:    B Natriuretic Peptide 204.4 (*)    All other components within normal limits  I-STAT CHEM 8, ED - Abnormal; Notable for the following:    Glucose, Bld 119 (*)    Hemoglobin 9.5 (*)    HCT 28.0 (*)    All other components within normal limits  I-STAT CG4 LACTIC ACID, ED - Abnormal; Notable for the following:    Lactic Acid, Venous 2.34 (*)    All other components within normal limits  MAGNESIUM  TROPONIN I  I-STAT  TROPOININ, ED  I-STAT TROPOININ, ED  I-STAT CG4 LACTIC ACID, ED    Imaging Review Ct Angio Chest Pe W/cm &/or Wo Cm  12/05/2015  CLINICAL DATA:  Mid chest pain radiating to the right with deep inspiration. Symptoms began today. Initial encounter. EXAM: CT ANGIOGRAPHY CHEST WITH CONTRAST TECHNIQUE: Multidetector CT imaging of the chest was performed using the standard protocol during bolus administration of intravenous contrast. Multiplanar CT image reconstructions and MIPs were obtained to evaluate the vascular anatomy. CONTRAST:  100 mL OMNIPAQUE IOHEXOL 350 MG/ML SOLN COMPARISON:  PA and lateral chest 11/25/2015 in 03/01/2009. FINDINGS: No pulmonary embolus is identified. The patient is status post CABG. There is cardiomegaly. Small left pleural effusion is identified. There is a small to moderate pericardial effusion. No axillary, hilar or mediastinal lymphadenopathy. The lungs demonstrate dependent basilar atelectasis bilaterally, worse on the left. There is no pneumothorax. Visualized upper abdomen shows no focal abnormality. No focal bony abnormality is identified. Review of the MIP images confirms the above findings. IMPRESSION: Negative for pulmonary embolus. Small left and small to moderate pericardial effusion. Dependent bilateral atelectasis, worse on the left. Cardiomegaly without evidence of edema. Electronically Signed   By: Inge Rise M.D.   On: 12/05/2015 16:23   I have personally reviewed and evaluated these images and lab results as part of my medical decision-making.   EKG Interpretation   Date/Time:  Wednesday December 05 2015 16:45:40 EST Ventricular Rate:  83 PR Interval:  149 QRS Duration: 101 QT Interval:  407 QTC Calculation: 478 R Axis:   -17 Text Interpretation:  Sinus rhythm Borderline left axis deviation  Nonspecific T abnrm, anterolateral leads Borderline prolonged QT interval  No significant change since last tracing Confirmed by [facility] MD, Tanna Loeffler  ([id number])  on 12/05/2015 5:32:44 PM      MDM   Final diagnoses:  Precordial pain  S/P CABG x 5  Hypotension, unspecified hypotension type   65 year old male with a history of coronary artery disease status post CABG on 11/20/2014 17, diabetes, hypertension, hyperlipidemia who presents with concern for chest pain. Patient reports chest pain was relieved with nitroglycerin at home. Reports he checked his blood pressure prior to nitroglycerin and it was 505 397 systolic, however decreased and on arrival to the emergency department patient's  blood pressures are 62M and 35D systolic. Patient denies any infectious symptoms including no cough, no abdominal pain, no urinary symptoms, and incisions appear clean, dry, intact without surrounding cellulitis, and on arrival had low suspicion for sepsis as etiology of low blood pressures. Pt also without leukocytosis and without fever. Initially felt blood pressures are likely decreased secondary to recent nitroglycerin use, especially given family report of normal BP prior to nitro.  No sign of JVD or tachycardia to suggest tamponade.   CT PE study done to evaluate for PE given leg swelling, recent surgery and shows no evidence of PE. Does show pericardial effusion small-moderate in size.  Cardiology consulted. Troponin negative. EKG without acute findings.  Evaluated brief bedside ECHO with Cardiology at bedside with no signs of tamponade. Will continue to closely monitor blood pressures as inpatient. Do not suspect sepsis at this time.  BP decrease likely medication effect.     Gareth Morgan, MD 12/05/15 [phone]

## 2015-12-05 NOTE — ED Notes (Signed)
Dr Regenia Skeeter given a copy of lactic acid results 2.35

## 2015-12-05 NOTE — ED Notes (Signed)
Pt to ct 

## 2015-12-05 NOTE — ED Notes (Signed)
Admitting doctor at the bedside 

## 2015-12-05 NOTE — ED Notes (Signed)
The pt reports that his bp is usually higher than now

## 2015-12-05 NOTE — ED Notes (Signed)
Meal tray arrived.  Up to br

## 2015-12-05 NOTE — ED Notes (Signed)
Pt returhned frfom c-t 1000nss added to iv.  Unable to click it off someone doing a tgerst on that side of the room

## 2015-12-05 NOTE — ED Notes (Signed)
Dinner tray here

## 2015-12-06 ENCOUNTER — Observation Stay (HOSPITAL_BASED_OUTPATIENT_CLINIC_OR_DEPARTMENT_OTHER): Payer: BLUE CROSS/BLUE SHIELD

## 2015-12-06 ENCOUNTER — Ambulatory Visit: Payer: BLUE CROSS/BLUE SHIELD | Admitting: Physician Assistant

## 2015-12-06 DIAGNOSIS — R072 Precordial pain: Secondary | ICD-10-CM

## 2015-12-06 DIAGNOSIS — E7800 Pure hypercholesterolemia, unspecified: Secondary | ICD-10-CM | POA: Diagnosis not present

## 2015-12-06 DIAGNOSIS — I2510 Atherosclerotic heart disease of native coronary artery without angina pectoris: Secondary | ICD-10-CM | POA: Diagnosis not present

## 2015-12-06 DIAGNOSIS — I10 Essential (primary) hypertension: Secondary | ICD-10-CM | POA: Diagnosis not present

## 2015-12-06 LAB — CBC
HCT: 26.9 % — ABNORMAL LOW (ref 39.0–52.0)
Hemoglobin: 8.9 g/dL — ABNORMAL LOW (ref 13.0–17.0)
MCH: 29.3 pg (ref 26.0–34.0)
MCHC: 33.1 g/dL (ref 30.0–36.0)
MCV: 88.5 fL (ref 78.0–100.0)
Platelets: 293 10*3/uL (ref 150–400)
RBC: 3.04 MIL/uL — ABNORMAL LOW (ref 4.22–5.81)
RDW: 13 % (ref 11.5–15.5)
WBC: 5.7 10*3/uL (ref 4.0–10.5)

## 2015-12-06 LAB — GLUCOSE, CAPILLARY
GLUCOSE-CAPILLARY: 131 mg/dL — AB (ref 65–99)
Glucose-Capillary: 91 mg/dL (ref 65–99)
Glucose-Capillary: 146 mg/dL — ABNORMAL HIGH (ref 65–99)

## 2015-12-06 LAB — COMPREHENSIVE METABOLIC PANEL
ALBUMIN: 2.9 g/dL — AB (ref 3.5–5.0)
ALK PHOS: 47 U/L (ref 38–126)
ALT: 33 U/L (ref 17–63)
ANION GAP: 11 (ref 5–15)
AST: 24 U/L (ref 15–41)
BILIRUBIN TOTAL: 0.4 mg/dL (ref 0.3–1.2)
BUN: 12 mg/dL (ref 6–20)
CALCIUM: 8.9 mg/dL (ref 8.9–10.3)
CO2: 20 mmol/L — AB (ref 22–32)
CREATININE: 1.04 mg/dL (ref 0.61–1.24)
Chloride: 108 mmol/L (ref 101–111)
GFR calc Af Amer: >60 mL/min (ref >60)
GFR calc non Af Amer: >60 mL/min (ref >60)
Glucose, Bld: 155 mg/dL — ABNORMAL HIGH (ref 65–99)
Potassium: 4 mmol/L (ref 3.5–5.1)
SODIUM: 139 mmol/L (ref 135–145)
TOTAL PROTEIN: 6.3 g/dL — AB (ref 6.5–8.1)

## 2015-12-06 LAB — TROPONIN I
TROPONIN I: 0.03 ng/mL (ref <0.031)
TROPONIN I: 0.03 ng/mL (ref <0.031)

## 2015-12-06 LAB — PROTIME-INR
INR: 1.3 (ref 0.00–1.49)
PROTHROMBIN TIME: 16.3 s — AB (ref 11.6–15.2)

## 2015-12-06 LAB — ECHOCARDIOGRAM COMPLETE
Height: 67 in
WEIGHTICAEL: 3008.84 [oz_av]

## 2015-12-06 MED ORDER — METOPROLOL TARTRATE 12.5 MG HALF TABLET
12.5000 mg | ORAL_TABLET | Freq: Two times a day (BID) | ORAL | Status: DC
  Administered 2015-12-06: 12.5 mg via ORAL
  Filled 2015-12-06: qty 1

## 2015-12-06 MED ORDER — TRAMADOL HCL 50 MG PO TABS: 50.0000 mg | ORAL_TABLET | Freq: Four times a day (QID) | ORAL | Status: DC | PRN

## 2015-12-06 MED ORDER — METOPROLOL TARTRATE 25 MG PO TABS: 12.5000 mg | ORAL_TABLET | Freq: Two times a day (BID) | ORAL | Status: DC

## 2015-12-06 NOTE — Progress Notes (Addendum)
    Subjective:  Denies CP or dyspnea; Mild chest tightness last evening that increased with inspiration.   Objective:  Filed Vitals:   12/05/15 1930 12/05/15 2041 12/05/15 2046 12/06/15 0455  BP: 100/67  100/69 106/73  Pulse: 88  89 90  Temp:   98.4 F (36.9 C) 98.2 F (36.8 C)  TempSrc:   Oral Oral  Resp: 24  18 18   Height:   5\' 7"  (1.702 m)   Weight:   191 lb 3.2 oz (86.728 kg) 188 lb 0.8 oz (85.3 kg)  SpO2: 97% 99% 99% 97%    Intake/Output from previous [place]:  Intake/Output Summary (Last 24 hours) at 12/06/15 [id number] Last data filed at 12/05/15 1704  Gross per 24 hour  Intake   2000 ml  Output      0 ml  Net   2000 ml    Physical Exam: Physical exam: Well-developed well-nourished in no acute distress.  Skin is warm and dry.  HEENT is normal.  Neck is supple.  Chest Diminished breath sounds left lower lobe; status post sternotomy Cardiovascular exam is regular rate and rhythm.  Abdominal exam nontender or distended. No masses palpated. Extremities show no edema. neuro grossly intact    Lab Results: Basic Metabolic Panel:  Recent Labs  12/05/15 1404 12/05/15 1405 12/06/15 0755  NA 140 141 139  K 4.5 4.5 4.0  CL 104 105 108  CO2 21*  --  20*  GLUCOSE 125* 119* 155*  BUN 15 17 12   CREATININE 1.16 1.10 1.04  CALCIUM 9.5  --  8.9  MG 2.0  --   --    CBC:  Recent Labs  12/05/15 1404 12/05/15 1405 12/06/15 0755  WBC 6.4  --  5.7  NEUTROABS 4.4  --   --   HGB 9.4* 9.5* 8.9*  HCT 28.0* 28.0* 26.9*  MCV 88.1  --  88.5  PLT 303  --  293   Cardiac Enzymes:  Recent Labs  12/05/15 2058 12/06/15 0235 12/06/15 0755  TROPONINI <0.03 0.03 0.03     Assessment/Plan:  1 chest pain-symptoms are somewhat atypical. Enzymes are negative. LFTs, amylase and lipase unremarkable. CTA showed no pulmonary embolus. Plan echocardiogram to exclude pericardial effusion and wall motion. If unremarkable plan conservative measures. Would discharge and follow-up with  Dr. [name]. 2 hypertension-blood pressure is running low. Decrease metoprolol to 12.5 mg twice a Beske. Denies approval has been discontinued. 3 coronary artery disease-continue aspirin and statin. 4 hyperlipidemia-continue statin.  Kirk Ruths 12/06/2015, 8:52 AM   Will give ultram 50 q 6 PRN for pain at home at DC (oxycodone has not made him feel well at home). Kirk Ruths

## 2015-12-06 NOTE — Progress Notes (Signed)
  Echocardiogram 2D Echocardiogram has been performed.  Jennette Dubin 12/06/2015, 4:16 PM

## 2015-12-06 NOTE — Discharge Summary (Signed)
Discharge Summary    Patient ID: Jerome Vaughn,  MRN: [id number], DOB/AGE: Nov 21, 1950 65 y.o.  Admit date: 12/05/2015 Discharge date: 12/06/2015  Primary Care Provider: Alysia Penna A Primary Cardiologist: Previously Dr. Mare Ferrari, Now Dr. [name]  Discharge Diagnoses    Principal Problem:   Chest pain Active Problems:   Hypertension   Hyperlipidemia   CAD (coronary artery disease)    History of Present Illness     Jerome Vaughn is a 65 y.o. male with past medical history of CAD (recent CABG on 11/21/2015 with LIMA-LAD, SVG-Diag, SVG-OM1 and distal Cx, and SVG-PDA), Type 2 DM, HTN, and HLD who presented to [facility] ED on 12/05/2015 for evaluation of chest pain.   He was initially seen by Richardson Dopp, PA-C for an ETT on 11/14/2015 which showed significant ST depression. A cardiac catheterization was recommended and performed on 11/21/2015. It showed multivessel CAD with CTO of the mid-RCA, distal RCA with collaterals from the LCx, 70% stenosis in the Left Main, 90% stenosis in the LAD, and 70% stenosis in the OM. CABG was recommended and this was performed on 11/22/2015 with the LIMA-LAD, SVG-Diag, SVG-OM1 and distal Cx, and SVG-PDA. He tolerated the procedure well and had no significant complications following surgery. He was discharged in stable condition on 11/27/2015.  He presented with chest pain which started earlier that morning He described the pain as a pressure and rated it as a 10/10. The pain was along his left pectoral region and epigastrium, and associated with nausea. He was given SL NTG and reported significant improvement in his pain after the second dose. He says this pain is different from what he experienced after surgery, saying his chest was tender to touch for the week following surgery but this is completely different. Also says this pain is different from his previous angina leading to his initial cardiac workup, for he was having chest pain and dyspnea.    Hospital  Course     Consultants: None  While in the ED, his initial troponin was negative. WBC 6.4. Hgb 9.4. Platelets 303. Electrolytes without significant abnormalities. Creatinine 1.16. Mg 2.0. BNP 204. Lactic Acid 2.34. EKG showed NSR, HR 78. TWI in V1 and V2 with slight ST elevation in inferior leads. A CTA was performed which showed no evidence of a pulmonary embolism. There was a small to moderate pericardial effusion noted.  Overnight, his cyclic troponin values remained negative. Amylase and Lipase were within normal limits. He was without recurrence of chest pain.   An echocardiogram was performed to exclude a pericardial effusion. This showed a small to moderate pericardial effusion but no evidence of tamponade physiology. His BP remained soft, therefore his Lopressor was decreased from 74m BID to 12.555mBID. He reported not taking his Oxycodone at home due to it making him "feel funny". He will be prescribed Ultram 5016m6H PRN for the pain. He was given a printed Rx with no refills.   He was last examined by Dr. CreStanford Breedd deemed stable for discharge. Cardiology follow-up has been arranged on 12/24/2015.  Discharge Vitals Blood pressure 101/69, pulse 87, temperature 98 F (36.7 C), temperature source Oral, resp. rate 20, height '5\' 7"'  (1.702 m), weight 188 lb 0.8 oz (85.3 kg), SpO2 99 %.  Filed Weights   12/05/15 1339 12/05/15 2046 12/06/15 0455  Weight: 189 lb (85.73 kg) 191 lb 3.2 oz (86.728 kg) 188 lb 0.8 oz (85.3 kg)    Labs & Radiologic Studies  CBC  Recent Labs  12/05/15 1404 12/05/15 1405 12/06/15 0755  WBC 6.4  --  5.7  NEUTROABS 4.4  --   --   HGB 9.4* 9.5* 8.9*  HCT 28.0* 28.0* 26.9*  MCV 88.1  --  88.5  PLT 303  --  774   Basic Metabolic Panel  Recent Labs  12/05/15 1404 12/05/15 1405 12/06/15 0755  NA 140 141 139  K 4.5 4.5 4.0  CL 104 105 108  CO2 21*  --  20*  GLUCOSE 125* 119* 155*  BUN '15 17 12  ' CREATININE 1.16 1.10 1.04  CALCIUM 9.5  --  8.9   MG 2.0  --   --    Liver Function Tests  Recent Labs  12/05/15 1404 12/06/15 0755  AST 24 24  ALT 38 33  ALKPHOS 48 47  BILITOT 0.4 0.4  PROT 7.0 6.3*  ALBUMIN 3.2* 2.9*    Recent Labs  12/05/15 2058  LIPASE 38  AMYLASE 67   Cardiac Enzymes  Recent Labs  12/05/15 2058 12/06/15 0235 12/06/15 0755  TROPONINI <0.03 0.03 0.03     Ct Angio Chest Pe W/cm &/or Wo Cm: 12/05/2015  CLINICAL DATA:  Mid chest pain radiating to the right with deep inspiration. Symptoms began today. Initial encounter. EXAM: CT ANGIOGRAPHY CHEST WITH CONTRAST TECHNIQUE: Multidetector CT imaging of the chest was performed using the standard protocol during bolus administration of intravenous contrast. Multiplanar CT image reconstructions and MIPs were obtained to evaluate the vascular anatomy. CONTRAST:  100 mL OMNIPAQUE IOHEXOL 350 MG/ML SOLN COMPARISON:  PA and lateral chest 11/25/2015 in 03/01/2009. FINDINGS: No pulmonary embolus is identified. The patient is status post CABG. There is cardiomegaly. Small left pleural effusion is identified. There is a small to moderate pericardial effusion. No axillary, hilar or mediastinal lymphadenopathy. The lungs demonstrate dependent basilar atelectasis bilaterally, worse on the left. There is no pneumothorax. Visualized upper abdomen shows no focal abnormality. No focal bony abnormality is identified. Review of the MIP images confirms the above findings. IMPRESSION: Negative for pulmonary embolus. Small left and small to moderate pericardial effusion. Dependent bilateral atelectasis, worse on the left. Cardiomegaly without evidence of edema. Electronically Signed   By: Inge Rise M.D.   On: 12/05/2015 16:23    Diagnostic Studies/Procedures     Echocardiogram: 12/06/2015  Study Conclusions  - Left ventricle: The cavity size was normal. There was moderate  concentric hypertrophy. Systolic function was normal. The  estimated ejection fraction was in the  range of 60% to 65%. Wall  motion was normal; there were no regional wall motion  abnormalities. Features are consistent with a pseudonormal left  ventricular filling pattern, with concomitant abnormal relaxation  and increased filling pressure (grade 2 diastolic dysfunction). - Aortic valve: Transvalvular velocity was within the normal range.  There was no stenosis. There was no regurgitation. - Mitral valve: There was no regurgitation. - Right ventricle: The cavity size was normal. Wall thickness was  normal. Systolic function was normal. - Tricuspid valve: There was mild regurgitation. - Inferior vena cava: The vessel was normal in size. The  respirophasic diameter changes were in the normal range (>= 50%),  consistent with normal central venous pressure. - Pericardium, extracardiac: A small to moderate pericardial  effusion was identified circumferential to the heart. The fluid  had no internal echoes. Measures up to 1.24 cm. Features were not  consistent with tamponade physiology. There was a left pleural  effusion.    Disposition  Pt is being discharged home today in good condition.  Follow-up Plans & Appointments    Follow-up Information    Follow up with Richardson Dopp, PA-C On 12/24/2015.   Specialties:  Physician Assistant, Radiology, Interventional Cardiology   Why:  Cardiology Hospital Follow-Up on 12/24/2015 at 10:10AM.   Contact information:   [address] [place] [address] [phone]        Discharge Medications   Current Discharge Medication List    START taking these medications   Details  traMADol (ULTRAM) 50 MG tablet Take 1 tablet (50 mg total) by mouth every 6 (six) hours as needed. Qty: 30 tablet, Refills: 0      CONTINUE these medications which have CHANGED   Details  metoprolol tartrate (LOPRESSOR) 25 MG tablet Take 0.5 tablets (12.5 mg total) by mouth 2 (two) times daily. Qty: 60 tablet, Refills: 1        CONTINUE these medications which have NOT CHANGED   Details  aspirin EC 325 MG EC tablet Take 1 tablet (325 mg total) by mouth daily.    Blood Glucose Monitoring Suppl (ACCU-CHEK AVIVA PLUS) W/DEVICE KIT Test once per [place] Qty: 1 kit, Refills: 0    bromocriptine (PARLODEL) 2.5 MG tablet Take 0.5 tablets (1.25 mg total) by mouth at bedtime. Qty: 45 tablet, Refills: 3    canagliflozin (INVOKANA) 300 MG TABS tablet Take 300 mg by mouth daily before breakfast. Qty: 30 tablet, Refills: 3    CINNAMON PO Take 2,000 mg by mouth daily.     glipiZIDE (GLUCOTROL XL) 2.5 MG 24 hr tablet Take 1 tablet (2.5 mg total) by mouth daily with breakfast. Qty: 90 tablet, Refills: 3    glucose blood (ACCU-CHEK AVIVA PLUS) test strip Diagnosis code is E 11.9 Qty: 100 each, Refills: 0    Lancets (ACCU-CHEK SOFT TOUCH) lancets Dispense for Aviva plus and diagnosis code is E 11.9 Qty: 100 each, Refills: 0    metFORMIN (GLUCOPHAGE-XR) 500 MG 24 hr tablet Take 4 tablets by mouth  daily with breakfast Qty: 360 tablet, Refills: 3    Multiple Vitamin (MULTIVITAMIN) tablet Take 1 tablet by mouth daily.      Omega-3 Fatty Acids (FISH OIL) 1000 MG CAPS Take 1,000 mg by mouth daily.     oxyCODONE (OXY IR/ROXICODONE) 5 MG immediate release tablet Take 1-2 tablets (5-10 mg total) by mouth every 4 (four) hours as needed for severe pain. Qty: 50 tablet, Refills: 0    rosuvastatin (CRESTOR) 40 MG tablet Take 40 mg by mouth daily.    pioglitazone (ACTOS) 45 MG tablet Take 1 tablet by mouth  daily Qty: 90 tablet, Refills: 3      STOP taking these medications     benazepril (LOTENSIN) 20 MG tablet           Allergies No Known Allergies   Outstanding Labs/Studies   None  Duration of Discharge Encounter   Greater than 30 minutes including physician time.  Signed, Lyda Jester, PA-C 12/06/2015, 7:22 PM

## 2015-12-10 DIAGNOSIS — E118 Type 2 diabetes mellitus with unspecified complications: Secondary | ICD-10-CM | POA: Insufficient documentation

## 2015-12-10 NOTE — Progress Notes (Signed)
Subjective:    Patient ID: Jerome Vaughn, male    DOB: [birth year], 65 y.o.   MRN: [id number]  HPI Pt returns for f/u of diabetes mellitus: DM type: 2 Dx'ed: 6644 Complications: CAD.   Therapy: 4 oral meds.  DKA: never Severe hypoglycemia: never.  Pancreatitis: never.  Other: he has never been on insulin; he could not afford onglyza.  Interval history: He does not take pioglitizone.  no cbg record, but states cbg's are in the low-100's.  pt states he feels well in general.  Past Medical History  Diagnosis Date  . Diabetes mellitus   . Hyperlipidemia   . Hypertension   . Carpal tunnel syndrome     both hands  . Coronary artery disease     a. Cath 11/21/2015: Multivessel CAD --> CABG recommended. b. CABG on 11/22/2015:  LIMA-LAD, SVG-Diag, SVG-OM1 and distal Cx, and SVG-PDA.    Past Surgical History  Procedure Laterality Date  . Wrist ganglion excision    . Frozen shoulder release    . Colonsocopy  2012  . Cardiac catheterization N/A 11/21/2015    Procedure: Left Heart Cath and Coronary Angiography;  Surgeon: Jerome Crome, MD;  Location: [facility];  Service: Cardiovascular;  Laterality: N/A;  . Coronary artery bypass graft N/A 11/22/2015    Procedure: CORONARY ARTERY BYPASS GRAFTING (CABG) times five using the left internal mammary, right greater saphenous vein EVH, and left thigh greater saphenous vein EVH;  Surgeon: Jerome Poot, MD;  Location: [facility];  Service: Open Heart Surgery;  Laterality: N/A;  . Tee without cardioversion N/A 11/22/2015    Procedure: TRANSESOPHAGEAL ECHOCARDIOGRAM (TEE);  Surgeon: Jerome Poot, MD;  Location: [facility];  Service: Open Heart Surgery;  Laterality: N/A;    Social History   Social History  . Marital Status: Married    Spouse Name: N/A  . Number of Children: 5  . Years of Education: N/A   Occupational History  . Dispatcher    Social History Main Topics  . Smoking status: Former Smoker -- 0.25 packs/[place] for 20 years    Types:  Cigarettes    Quit date: 10/07/2013  . Smokeless tobacco: Never Used     Comment: quit cigarettes, might have a cigar 1 X month  . Alcohol Use: 1.8 oz/week    3 Standard drinks or equivalent per week  . Drug Use: No  . Sexual Activity: Not on file   Other Topics Concern  . 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 aspirin EC 325 MG EC tablet Take 1 tablet (325 mg total) by mouth daily.    . Blood Glucose Monitoring Suppl (ACCU-CHEK AVIVA PLUS) W/DEVICE KIT Test once per [place] 1 kit 0  . bromocriptine (PARLODEL) 2.5 MG tablet Take 0.5 tablets (1.25 mg total) by mouth at bedtime. (Patient taking differently: Take 1.25 mg by mouth daily. ) 45 tablet 3  . canagliflozin (INVOKANA) 300 MG TABS tablet Take 300 mg by mouth daily before breakfast. 30 tablet 3  . CINNAMON PO Take 2,000 mg by mouth daily.     Marland Kitchen glipiZIDE (GLUCOTROL XL) 2.5 MG 24 hr tablet Take 1 tablet (2.5 mg total) by mouth daily  with breakfast. 90 tablet 3  . glucose blood (ACCU-CHEK AVIVA PLUS) test strip Diagnosis code is E 11.9 100 each 0  . Lancets (ACCU-CHEK SOFT TOUCH) lancets Dispense for Aviva plus and diagnosis code is E 11.9 100 each 0  . metFORMIN (GLUCOPHAGE-XR) 500 MG 24 hr tablet Take 4 tablets by mouth  daily with breakfast 360 tablet 3  . metoprolol tartrate (LOPRESSOR) 25 MG tablet Take 0.5 tablets (12.5 mg total) by mouth 2 (two) times daily. 60 tablet 1  . Multiple Vitamin (MULTIVITAMIN) tablet Take 1 tablet by mouth daily.      . Omega-3 Fatty Acids (FISH OIL) 1000 MG CAPS Take 1,000 mg by mouth daily.     Marland Kitchen oxyCODONE (OXY IR/ROXICODONE) 5 MG immediate release tablet Take 1-2 tablets (5-10 mg total) by mouth every 4 (four) hours as needed for severe pain. 50 tablet 0  .  rosuvastatin (CRESTOR) 40 MG tablet Take 40 mg by mouth daily.    . traMADol (ULTRAM) 50 MG tablet Take 1 tablet (50 mg total) by mouth every 6 (six) hours as needed. 30 tablet 0   No current facility-administered medications on file prior to visit.    No Known Allergies  Family History  Problem Relation Age of Onset  . Hyperlipidemia Sister   . Diabetes Sister   . Hypertension Sister   . Anuerysm Brother   . Diabetes Maternal Grandmother   . Hyperlipidemia Sister   . Kidney disease Sister   . Heart attack Neg Hx     BP 102/64 mmHg  Pulse 96  Temp(Src) 98 F (36.7 C) (Oral)  Resp 20  Ht _0  (1.702 m)  Wt 191 lb 4 oz (86.75 kg)  BMI 29.95 kg/m2  SpO2 98%     Review of Systems He denies hypoglycemia    Objective:   Physical Exam VITAL SIGNS:  See vs page GENERAL: no distress Pulses: dorsalis pedis intact bilat.   MSK: no deformity of the feet CV: 2+ left leg edema (trace on the right).  Skin:  no ulcer on the feet.  normal color and temp on the feet.  Old healed surgical scars (vein harvest) at the legs Neuro: sensation is intact to touch on the feet     Assessment & Plan:  DM: well-controlled Edema: worse, due to surgery, but this may improve with time.    Patient is advised the following: Patient Instructions  check your blood sugar once a Munoz.  vary the time of Beals when you check, between before the 3 meals, and at bedtime.  also check if you have symptoms of your blood sugar being too high or too low.  please keep a record of the readings and bring it to your next appointment here.  You can write it on any piece of paper.  please call us sooner if your blood sugar goes below 70, or if you have a lot of readings over 200.    Please stay off the pioglitizone.   Please come back for a follow-up appointment in 2 months.    Please see [name] the same [place], to learn about insulin, just in case.

## 2015-12-10 NOTE — Patient Instructions (Addendum)
check your blood sugar once a [place].  vary the time of [place] when you check, between before the 3 meals, and at bedtime.  also check if you have symptoms of your blood sugar being too high or too low.  please keep a record of the readings and bring it to your next appointment here.  You can write it on any piece of paper.  please call us sooner if your blood sugar goes below 70, or if you have a lot of readings over 200.    Please stay off the pioglitizone.   Please come back for a follow-up appointment in 2 months.    Please see [name] the same Mart[place], to learn about insulin, just in case.

## 2015-12-11 ENCOUNTER — Ambulatory Visit (INDEPENDENT_AMBULATORY_CARE_PROVIDER_SITE_OTHER): Payer: BLUE CROSS/BLUE SHIELD | Admitting: Endocrinology

## 2015-12-11 ENCOUNTER — Encounter: Payer: Self-pay | Admitting: Endocrinology

## 2015-12-11 VITALS — BP 102/64 | HR 96 | Temp 98.0°F | Resp 20 | Ht 67.0 in | Wt 191.2 lb

## 2015-12-11 DIAGNOSIS — E119 Type 2 diabetes mellitus without complications: Secondary | ICD-10-CM | POA: Diagnosis not present

## 2015-12-11 DIAGNOSIS — E1142 Type 2 diabetes mellitus with diabetic polyneuropathy: Secondary | ICD-10-CM

## 2015-12-11 NOTE — Progress Notes (Signed)
Pre visit review using our clinic review tool, if applicable. No additional management support is needed unless otherwise documented below in the visit note. 

## 2015-12-21 ENCOUNTER — Other Ambulatory Visit: Payer: Self-pay | Admitting: Cardiothoracic Surgery

## 2015-12-21 DIAGNOSIS — Z951 Presence of aortocoronary bypass graft: Secondary | ICD-10-CM

## 2015-12-24 ENCOUNTER — Telehealth: Payer: Self-pay

## 2015-12-24 ENCOUNTER — Ambulatory Visit
Admission: RE | Admit: 2015-12-24 | Discharge: 2015-12-24 | Disposition: A | Discharge status: Home / Self Care | Payer: BLUE CROSS/BLUE SHIELD | Source: Ambulatory Visit | Attending: Cardiothoracic Surgery | Admitting: Cardiothoracic Surgery

## 2015-12-24 ENCOUNTER — Ambulatory Visit (HOSPITAL_COMMUNITY)
Admission: RE | Admit: 2015-12-24 | Discharge: 2015-12-24 | Disposition: A | Discharge status: Home / Self Care | Payer: BLUE CROSS/BLUE SHIELD | Source: Ambulatory Visit | Attending: Surgery | Admitting: Surgery

## 2015-12-24 ENCOUNTER — Ambulatory Visit (INDEPENDENT_AMBULATORY_CARE_PROVIDER_SITE_OTHER): Payer: Self-pay | Admitting: Physician Assistant

## 2015-12-24 ENCOUNTER — Ambulatory Visit (INDEPENDENT_AMBULATORY_CARE_PROVIDER_SITE_OTHER): Payer: BLUE CROSS/BLUE SHIELD | Admitting: Physician Assistant

## 2015-12-24 ENCOUNTER — Encounter: Payer: Self-pay | Admitting: Physician Assistant

## 2015-12-24 VITALS — BP 128/90 | HR 84 | Ht 67.5 in | Wt 192.8 lb

## 2015-12-24 VITALS — BP 114/75 | HR 88 | Resp 20 | Ht 67.5 in | Wt 192.0 lb

## 2015-12-24 DIAGNOSIS — I2510 Atherosclerotic heart disease of native coronary artery without angina pectoris: Secondary | ICD-10-CM | POA: Diagnosis not present

## 2015-12-24 DIAGNOSIS — M7989 Other specified soft tissue disorders: Secondary | ICD-10-CM | POA: Diagnosis not present

## 2015-12-24 DIAGNOSIS — Z951 Presence of aortocoronary bypass graft: Secondary | ICD-10-CM

## 2015-12-24 DIAGNOSIS — I319 Disease of pericardium, unspecified: Secondary | ICD-10-CM

## 2015-12-24 DIAGNOSIS — E785 Hyperlipidemia, unspecified: Secondary | ICD-10-CM | POA: Insufficient documentation

## 2015-12-24 DIAGNOSIS — M79661 Pain in right lower leg: Secondary | ICD-10-CM

## 2015-12-24 DIAGNOSIS — E119 Type 2 diabetes mellitus without complications: Secondary | ICD-10-CM | POA: Insufficient documentation

## 2015-12-24 DIAGNOSIS — I10 Essential (primary) hypertension: Secondary | ICD-10-CM

## 2015-12-24 DIAGNOSIS — I3139 Other pericardial effusion (noninflammatory): Secondary | ICD-10-CM

## 2015-12-24 DIAGNOSIS — I313 Pericardial effusion (noninflammatory): Secondary | ICD-10-CM

## 2015-12-24 MED ORDER — METOPROLOL TARTRATE 25 MG PO TABS: 25.0000 mg | ORAL_TABLET | Freq: Two times a day (BID) | ORAL | Status: DC

## 2015-12-24 MED ORDER — FUROSEMIDE 20 MG PO TABS: 20.0000 mg | ORAL_TABLET | Freq: Every day | ORAL | Status: DC

## 2015-12-24 MED ORDER — METOPROLOL TARTRATE 25 MG PO TABS: 37.5000 mg | ORAL_TABLET | Freq: Two times a day (BID) | ORAL | Status: DC

## 2015-12-24 NOTE — Progress Notes (Signed)
  HPI:  Patient returns for routine postoperative follow-up having undergone S/P CABG x 5 on 11/27/2015. The patient's early postoperative recovery while in the hospital was straight forward. Since hospital discharge the patient reports he is doing well for the most part.  He has followed up at the Cardiology office at which time they made adjustments to his dose of Lopressor.  He also has some RLE calf pain and swelling.  He is ambulating independently.  He denies chest pain and shortness of breath.   Current Outpatient Prescriptions  Medication Sig Dispense Refill  . aspirin EC 325 MG EC tablet Take 1 tablet (325 mg total) by mouth daily.    . Blood Glucose Monitoring Suppl (ACCU-CHEK AVIVA PLUS) W/DEVICE KIT Test once per [place] 1 kit 0  . bromocriptine (PARLODEL) 2.5 MG tablet TAKE 0.5 TABLETS (1.25 MG TOTAL) BY MOUTH AT BEDTIME    . canagliflozin (INVOKANA) 300 MG TABS tablet Take 300 mg by mouth daily before breakfast. 30 tablet 3  . CINNAMON PO Take 2,000 mg by mouth daily.     Marland Kitchen glipiZIDE (GLUCOTROL XL) 2.5 MG 24 hr tablet Take 1 tablet (2.5 mg total) by mouth daily with breakfast. 90 tablet 3  . glucose blood (ACCU-CHEK AVIVA PLUS) test strip Diagnosis code is E 11.9 100 each 0  . Lancets (ACCU-CHEK SOFT TOUCH) lancets Dispense for Aviva plus and diagnosis code is E 11.9 100 each 0  . metFORMIN (GLUCOPHAGE-XR) 500 MG 24 hr tablet Take 4 tablets by mouth  daily with breakfast 360 tablet 3  . metoprolol tartrate (LOPRESSOR) 25 MG tablet Take 1.5 tablets (37.5 mg total) by mouth 2 (two) times daily. 270 tablet 3  . Multiple Vitamin (MULTIVITAMIN) tablet Take 1 tablet by mouth daily.      . Omega-3 Fatty Acids (FISH OIL) 1000 MG CAPS Take 1,000 mg by mouth daily.     Marland Kitchen oxyCODONE (OXY IR/ROXICODONE) 5 MG immediate release tablet Take 1-2 tablets (5-10 mg total) by mouth every 4 (four) hours as needed for severe pain. 50 tablet 0  . rosuvastatin (CRESTOR) 40 MG tablet Take 40 mg by mouth daily.     . traMADol (ULTRAM) 50 MG tablet Take 50 mg by mouth every 6 (six) hours as needed (FOR PAIN).     No current facility-administered medications for this visit.    Physical Exam   BP 114/75 mmHg  Pulse 88  Resp 20  Ht 5' 7.5" (1.715 m)  Wt 192 lb (87.091 kg)  BMI 29.61 kg/m2  SpO2 96%  Gen: no apparent distress Heart: RRR Lungs: CTA bilaterally Abd: soft non-tender Ext: RLE with significant calf swelling, tender to palpation, EVH sites are clean and dry.... Sternotomy and chest tube sites are well healed  Diagnostic Tests:  CXR:  Small left pleural effusion, some bilateral atelecatsis  A/P  1. S/P CABG x 5- doing well in regards to surgery recovery.  He is ambulating without difficulty.  Incisions are well healed, Cardiology following 2. RLE Calf pain and swelling- Im concerned the patient has a DVT, will get LE duplex.  If positive patient will require admission to hospital for anticoagulation 3. RTC prn  Ellwood Handler, PA-C [facility] [phone]

## 2015-12-24 NOTE — Telephone Encounter (Signed)
RLE duplex  was negative for DVT. RX for Lasix 20 mg po every Balzarini x 5 days was call to [facility]. Patient was informed.

## 2015-12-24 NOTE — Progress Notes (Signed)
Cardiology Office Note:    Date:  12/24/2015   ID:  Jerome Vaughn, DOB Mar 29, 1951, MRN [id number]  PCP:  [name], MD  Endocrinologist:  Dr. Loanne Drilling Cardiologist:  Dr. Daneen Schick   Electrophysiologist:  N/a  Referring MD:  Dr. Loanne Drilling   Chief Complaint  Patient presents with  . Hospitalization Follow-up    1. s/p CABG;  2. admx with CP , pericardial effusion    History of Present Illness:     Jerome Vaughn is a 65 y.o. male former smoker with a hx of DM, HTN, HL.  He has had a hx of L arm numbness and tingling for several mos. Patient was referred to our office in February 2017 for exercise treadmill test for exertional chest discomfort.  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 He was referred to [facility]. He was seen by Dr. Prescott Gum and underwent CABG with LIMA-LAD, SVG-diagonal, SVG-OM1/distal LCx, SVG-PDA on 11/22/15. Postoperative course was fairly uneventful. He was discharged 11/27/15.  He was readmitted 3/8-3/9. Cardiac markers remained normal. CT was negative for pulmonary embolism but did show small to moderate pericardial effusion. Echocardiogram confirmed pericardial effusion without evidence of tamponade.    He returns for follow-up. Overall, he is doing well. Denies any further chest pain. He sees Dr. Prescott Gum later today. He plans to start cardiac rehabilitation soon. Denies significant dyspnea. Denies orthopnea, PND or edema. Denies syncope. Denies fevers or chills.   Past Medical History  Diagnosis Date  . Diabetes mellitus   . Hyperlipidemia   . Hypertension   . Carpal tunnel syndrome     both hands  . Coronary artery disease     a. Cath 11/21/2015: Multivessel CAD --> CABG recommended. b. CABG on 11/22/2015:  LIMA-LAD, SVG-Diag, SVG-OM1 and distal Cx, and SVG-PDA.    Past Surgical History  Procedure Laterality Date  . Wrist ganglion excision    . Frozen shoulder release    . Colonsocopy  2012  . Cardiac catheterization N/A 11/21/2015    Procedure:  Left Heart Cath and Coronary Angiography;  Surgeon: [name], MD;  Location: [facility];  Service: Cardiovascular;  Laterality: N/A;  . Coronary artery bypass graft N/A 11/22/2015    Procedure: CORONARY ARTERY BYPASS GRAFTING (CABG) times five using the left internal mammary, right greater saphenous vein EVH, and left thigh greater saphenous vein EVH;  Surgeon: [name], MD;  Location: [facility];  Service: Open Heart Surgery;  Laterality: N/A;  . Tee without cardioversion N/A 11/22/2015    Procedure: TRANSESOPHAGEAL ECHOCARDIOGRAM (TEE);  Surgeon: [name], MD;  Location: [facility];  Service: Open Heart Surgery;  Laterality: N/A;    Current Medications: Outpatient Prescriptions Prior to Visit  Medication Sig Dispense Refill  . aspirin EC 325 MG EC tablet Take 1 tablet (325 mg total) by mouth daily.    . Blood Glucose Monitoring Suppl (ACCU-CHEK AVIVA PLUS) W/DEVICE KIT Test once per [place] 1 kit 0  . canagliflozin (INVOKANA) 300 MG TABS tablet Take 300 mg by mouth daily before breakfast. 30 tablet 3  . CINNAMON PO Take 2,000 mg by mouth daily.     Marland Kitchen glipiZIDE (GLUCOTROL XL) 2.5 MG 24 hr tablet Take 1 tablet (2.5 mg total) by mouth daily with breakfast. 90 tablet 3  . glucose blood (ACCU-CHEK AVIVA PLUS) test strip Diagnosis code is E 11.9 100 each 0  . Lancets (ACCU-CHEK SOFT TOUCH) lancets Dispense for Aviva plus and diagnosis  code is E 11.9 100 each 0  . metFORMIN (GLUCOPHAGE-XR) 500 MG 24 hr tablet Take 4 tablets by mouth  daily with breakfast 360 tablet 3  . Multiple Vitamin (MULTIVITAMIN) tablet Take 1 tablet by mouth daily.      . Omega-3 Fatty Acids (FISH OIL) 1000 MG CAPS Take 1,000 mg by mouth daily.     Marland Kitchen oxyCODONE (OXY IR/ROXICODONE) 5 MG immediate release tablet Take 1-2 tablets (5-10 mg total) by mouth every 4 (four) hours as needed for severe pain. 50 tablet 0  . rosuvastatin (CRESTOR) 40 MG tablet Take 40 mg by mouth daily.    . bromocriptine (PARLODEL) 2.5 MG  tablet Take 0.5 tablets (1.25 mg total) by mouth at bedtime. (Patient taking differently: Take 1.25 mg by mouth daily. ) 45 tablet 3  . metoprolol tartrate (LOPRESSOR) 25 MG tablet Take 0.5 tablets (12.5 mg total) by mouth 2 (two) times daily. 60 tablet 1  . traMADol (ULTRAM) 50 MG tablet Take 1 tablet (50 mg total) by mouth every 6 (six) hours as needed. 30 tablet 0   No facility-administered medications prior to visit.     Allergies:   Review of patient's allergies indicates no known allergies.   Social History   Social History  . Marital Status: Married    Spouse Name: N/A  . Number of Children: 5  . Years of Education: N/A   Occupational History  . Dispatcher    Social History Main Topics  . Smoking status: Former Smoker -- 0.25 packs/[place] for 20 years    Types: Cigarettes    Quit date: 10/07/2013  . Smokeless tobacco: Never Used     Comment: quit cigarettes, might have a cigar 1 X month  . Alcohol Use: 1.8 oz/week    3 Standard drinks or equivalent per week  . Drug Use: No  . Sexual Activity: Not Asked   Other Topics Concern  . 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 There is no history of Heart attack.   ROS:   Please see the history of present illness.    Review of Systems  Musculoskeletal: Positive for joint pain.  All other systems reviewed and are negative.   Physical Exam:    VS:  BP 128/90 mmHg  Pulse 84  Ht 5' 7.5" (1.715 m)  Wt 192 lb 12.8 oz (87.454 kg)  BMI 29.73 kg/m2   GEN: Well nourished, well developed, in no acute distress HEENT: normal Neck: no JVD, no masses Cardiac: Normal  S1/S2, RRR; no murmurs, rubs, or gallops, no edema Respiratory:  clear to auscultation bilaterally; no wheezing, rhonchi or rales GI: soft, nontender, nondistended  MS: no deformity or atrophy Skin: warm and dry  Neuro:   no focal deficits  Psych: Alert and oriented x 3, normal affect  Wt Readings from Last 3 Encounters:  12/24/15 192 lb 12.8 oz (87.454 kg)  12/11/15 191 lb 4 oz (86.75 kg)  12/06/15 188 lb 0.8 oz (85.3 kg)      Studies/Labs Reviewed:     EKG:  EKG is   ordered today.  The  ekg ordered today demonstrates NSR, HR 84, LAD, NSSTTW changes, QTc 449 ms.   Recent Labs: 12/05/2015: B Natriuretic Peptide 204.4*; Magnesium 2.0 12/06/2015: ALT 33; BUN 12; Creatinine, Ser 1.04; Hemoglobin 8.9*; Platelets 293; Potassium 4.0; Sodium 139   Recent Lipid Panel    Component Value Date/Time   CHOL 169 08/22/2014 1034   TRIG 146.0 08/22/2014 1034   HDL 51.80 08/22/2014 1034   CHOLHDL 3 08/22/2014 1034   VLDL 29.2 08/22/2014 1034   LDLCALC 88 08/22/2014 1034   LDLDIRECT 204.8 09/05/2013 1559    Additional studies/ records that were reviewed today include:   Echo 12/06/15  Moderate concentric LVH, EF 60-65%, normal wall motion, grade 2 diastolic dysfunction, normal RV function, mild TR, small to moderate pericardial effusion, no tamponade physiology, left pleural effusion  Chest CTA 12/05/15 Negative for pulmonary embolism, small to moderate pericardial effusion, dependent bilateral atelectasis, cardiomegaly  LHC 11/21/15 LM 70%, 90% LAD proximal 85%, D1 1 100% LCx OM1 75%, OM to 65% RCA mid 100%  Severe multivessel coronary disease with chronic total occlusion of the mid RCA, distal RCA collateralized from the left circumflex, 70% distal left main, segmental 90% ostial to proximal LAD with superimposed saccular aneurysm, 80% mid LAD, and 70% tandem stenoses in the first obtuse marginal. A branch of the first diagonal is totally occluded  Mild mid anterior wall hypokinesis. EF is  estimated to be 55%. EDP was normal.  ETT 11/14/15 2 mm or greater ST depression in inferolateral leads at peak exercise  Echo 6/10 Mild LVH, EF 55%   ASSESSMENT:     1. Coronary artery disease involving native coronary artery of native heart without angina pectoris   2. Pericardial effusion   3. Essential hypertension   4. Hyperlipidemia     PLAN:     In order of problems listed above:  1. CAD - s/p CABG 10/2015.  Overall, he is progressing well. As noted, he had a recent admission with chest pain and small to moderate pericardial effusion on echocardiogram. His chest discomfort has resolved. Continue aspirin, beta blocker, statin. As noted, he plans to start cardiac rehabilitation soon.  2. Pericardial effusion - Recent admit with chest pain and small to mod pericardial effusion.  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   Repeat Echo in 1 month.  3. HTN - Borderline control. His heart rate could be better. Increase metoprolol tartrate to 37.5 mg twice a [place]. He thinks that he may still be taking benazepril. He will check his medicines and let us know.  4. HL - This is followed by primary care. Continue statin.    Medication Adjustments/Labs and Tests Ordered: Current medicines are reviewed at length with the patient today.  Concerns regarding medicines are outlined above.  Medication changes, Labs and Tests ordered today are outlined in the Patient Instructions noted below. Patient Instructions  Medication Instructions:  1. INCREASE METOPROLOL TO 37.5 MG TWICE DAILY; (this is 1 and 1/2 tabs twice daily)  Labwork: NONE  Testing/Procedures: Your physician has requested that you have an LIMITED echocardiogram TO BE DONE IN 1 MONTH.Marland Kitchen Echocardiography is a painless test that uses sound waves to create images of your heart. It provides your doctor with information about the size and shape of your heart and how well your heart's chambers and valves are working. This procedure takes  approximately one hour. There are no restrictions for this procedure.  Follow-Up: 3 MONTHS WITH DR. [name]  Any Other Special Instructions Will Be Listed Below (  If Applicable). CALL AND LET us KNOW IF YOU ARE STILL TAKING THE BENAZEPRIL [phone]  If you need a refill on your cardiac medications before your next appointment, please call your pharmacy.   Signed, Richardson Dopp, PA-C  12/24/2015 11:10 AM    [facility] [address], [place], [place]  [address] Phone: [phone]; Fax: [phone]

## 2015-12-24 NOTE — Patient Instructions (Addendum)
Medication Instructions:  1. INCREASE METOPROLOL TO 37.5 MG TWICE DAILY; (this is 1 and 1/2 tabs twice daily)  Labwork: NONE  Testing/Procedures: Your physician has requested that you have an LIMITED echocardiogram TO BE DONE IN 1 MONTH.Marland Kitchen Echocardiography is a painless test that uses sound waves to create images of your heart. It provides your doctor with information about the size and shape of your heart and how well your heart's chambers and valves are working. This procedure takes approximately one hour. There are no restrictions for this procedure.  Follow-Up: 3 MONTHS WITH DR. [name]  Any Other Special Instructions Will Be Listed Below (If Applicable). CALL AND LET us KNOW IF YOU ARE STILL TAKING THE BENAZEPRIL [phone]  If you need a refill on your cardiac medications before your next appointment, please call your pharmacy.

## 2016-01-04 ENCOUNTER — Telehealth: Payer: Self-pay | Admitting: Endocrinology

## 2016-01-04 ENCOUNTER — Telehealth: Payer: Self-pay | Admitting: Family Medicine

## 2016-01-04 ENCOUNTER — Other Ambulatory Visit: Payer: Self-pay | Admitting: Family Medicine

## 2016-01-04 ENCOUNTER — Ambulatory Visit: Payer: BLUE CROSS/BLUE SHIELD | Admitting: Endocrinology

## 2016-01-04 MED ORDER — GLUCOSE BLOOD VI STRP: ORAL_STRIP | Status: DC

## 2016-01-04 MED ORDER — EMPAGLIFLOZIN 25 MG PO TABS: 25.0000 mg | ORAL_TABLET | Freq: Every day | ORAL | Status: DC

## 2016-01-04 NOTE — Telephone Encounter (Signed)
I called the pt and advised we have sent the test strips. Pt stated he was able to pick the Jardiance up at the pharmacy.

## 2016-01-04 NOTE — Telephone Encounter (Signed)
Pt need to Rx for Accu chek Aviva plus test strips.  Pharm [facility] on [address]  Abbott [facility] is out of Rx

## 2016-01-04 NOTE — Telephone Encounter (Signed)
please call patient: Ins wants you to change invokana to jardiance.  They are similar. i have sent a prescription to your pharmacy

## 2016-01-04 NOTE — Telephone Encounter (Signed)
Pt needs his test strips sent to [facility] and would also like a call back from you to discuss other matters.

## 2016-01-04 NOTE — Telephone Encounter (Signed)
Pt advised of note below and voiced understanding.  

## 2016-01-04 NOTE — Telephone Encounter (Signed)
Pt see's a provider for his diabetes, he should request this from internal medicine. I spoke with pt and he will contact that office.

## 2016-01-09 ENCOUNTER — Other Ambulatory Visit: Payer: Self-pay | Admitting: Endocrinology

## 2016-01-15 ENCOUNTER — Telehealth (HOSPITAL_COMMUNITY): Payer: Self-pay | Admitting: *Deleted

## 2016-01-21 ENCOUNTER — Ambulatory Visit (HOSPITAL_COMMUNITY): Payer: BLUE CROSS/BLUE SHIELD | Attending: Cardiovascular Disease

## 2016-01-21 ENCOUNTER — Telehealth: Payer: Self-pay | Admitting: *Deleted

## 2016-01-21 ENCOUNTER — Encounter: Payer: Self-pay | Admitting: Physician Assistant

## 2016-01-21 ENCOUNTER — Other Ambulatory Visit: Payer: Self-pay

## 2016-01-21 DIAGNOSIS — I2510 Atherosclerotic heart disease of native coronary artery without angina pectoris: Secondary | ICD-10-CM | POA: Diagnosis not present

## 2016-01-21 DIAGNOSIS — I3139 Other pericardial effusion (noninflammatory): Secondary | ICD-10-CM

## 2016-01-21 DIAGNOSIS — E785 Hyperlipidemia, unspecified: Secondary | ICD-10-CM | POA: Diagnosis not present

## 2016-01-21 DIAGNOSIS — Z87891 Personal history of nicotine dependence: Secondary | ICD-10-CM | POA: Diagnosis not present

## 2016-01-21 DIAGNOSIS — E119 Type 2 diabetes mellitus without complications: Secondary | ICD-10-CM | POA: Insufficient documentation

## 2016-01-21 DIAGNOSIS — I119 Hypertensive heart disease without heart failure: Secondary | ICD-10-CM | POA: Insufficient documentation

## 2016-01-21 DIAGNOSIS — I313 Pericardial effusion (noninflammatory): Secondary | ICD-10-CM

## 2016-01-21 DIAGNOSIS — I319 Disease of pericardium, unspecified: Secondary | ICD-10-CM | POA: Diagnosis not present

## 2016-01-21 NOTE — Telephone Encounter (Signed)
Pt has been notified of Limted Echo results by phone with verbal understanding.

## 2016-01-22 ENCOUNTER — Other Ambulatory Visit: Payer: Self-pay | Admitting: Endocrinology

## 2016-02-11 ENCOUNTER — Ambulatory Visit (INDEPENDENT_AMBULATORY_CARE_PROVIDER_SITE_OTHER): Payer: BLUE CROSS/BLUE SHIELD | Admitting: Ophthalmology

## 2016-02-11 ENCOUNTER — Other Ambulatory Visit: Payer: Self-pay | Admitting: Cardiothoracic Surgery

## 2016-02-11 DIAGNOSIS — E11319 Type 2 diabetes mellitus with unspecified diabetic retinopathy without macular edema: Secondary | ICD-10-CM | POA: Diagnosis not present

## 2016-02-11 DIAGNOSIS — H43813 Vitreous degeneration, bilateral: Secondary | ICD-10-CM

## 2016-02-11 DIAGNOSIS — E113293 Type 2 diabetes mellitus with mild nonproliferative diabetic retinopathy without macular edema, bilateral: Secondary | ICD-10-CM

## 2016-02-11 DIAGNOSIS — H35033 Hypertensive retinopathy, bilateral: Secondary | ICD-10-CM

## 2016-02-11 DIAGNOSIS — Z951 Presence of aortocoronary bypass graft: Secondary | ICD-10-CM

## 2016-02-11 DIAGNOSIS — I10 Essential (primary) hypertension: Secondary | ICD-10-CM

## 2016-02-12 ENCOUNTER — Encounter: Payer: Self-pay | Admitting: Endocrinology

## 2016-02-12 ENCOUNTER — Encounter: Payer: BLUE CROSS/BLUE SHIELD | Attending: Endocrinology | Admitting: Nutrition

## 2016-02-12 ENCOUNTER — Ambulatory Visit (INDEPENDENT_AMBULATORY_CARE_PROVIDER_SITE_OTHER): Payer: BLUE CROSS/BLUE SHIELD | Admitting: Endocrinology

## 2016-02-12 VITALS — BP 122/80 | HR 76 | Temp 98.5°F | Wt 190.0 lb

## 2016-02-12 DIAGNOSIS — E1142 Type 2 diabetes mellitus with diabetic polyneuropathy: Secondary | ICD-10-CM | POA: Diagnosis not present

## 2016-02-12 DIAGNOSIS — E291 Testicular hypofunction: Secondary | ICD-10-CM | POA: Diagnosis not present

## 2016-02-12 LAB — POCT GLYCOSYLATED HEMOGLOBIN (HGB A1C): HEMOGLOBIN A1C: 7.4

## 2016-02-12 MED ORDER — SILDENAFIL CITRATE 100 MG PO TABS: 50.0000 mg | ORAL_TABLET | Freq: Every day | ORAL | Status: DC | PRN

## 2016-02-12 MED ORDER — LINAGLIPTIN 5 MG PO TABS: 5.0000 mg | ORAL_TABLET | Freq: Every day | ORAL | Status: DC

## 2016-02-12 NOTE — Progress Notes (Signed)
Discussed why insulin is needed, how to store the insulin, and  how to dial the dose and inject the insulin. Discussed the need to rotate sites.   Also discussed low blood sugars--symptoms and treatments.  He reported good understanding of this and had no questions. Diet review: Bfast:  Cold cereal, fruit, and 8 ounces juice Lunch: 1/2 sandwich with pickle and water to drink Supper; chicken breast, or [place] burger, 1-2 starchy veg or bread, 2 non starchy veg.   HS snack:  2 ounces pretzels, or 1/2 bag of popcorn Alcohol: beer 1 q 2 days.  Activity:  Very active in the am--yard work, Writer.  Afternoon: walking 3 miles (1 hour)  Blood sugars:  Did not bring meter.  Says FBSs are in the low 120s. Not testing other times.  Plan::  1.  Suggested he test 2hr. After breakfast and if over 170, stop the juice.  He agreed to do this.

## 2016-02-12 NOTE — Patient Instructions (Addendum)
check your blood sugar once a Pall[place].  vary the time of [place] when you check, between before the 3 meals, and at bedtime.  also check if you have symptoms of your blood sugar being too high or too low.  please keep a record of the readings and bring it to your next appointment here.  You can write it on any piece of paper.  please call us sooner if your blood sugar goes below 70, or if you have a lot of readings over 200.    Please change the glipizide to "tradjenta."  i have sent a prescription to your pharmacy.   If your blood sugar goes up, the next option would be to add "repaglinide."  An alternative would be to change the tradjenta to "trulicity" (once a week injection).   Our goals are to get the a1c less than 7, without low blood sugar.   blood tests are requested for you today.  We'll let you know about the results. Please come back for a follow-up appointment in 2 months.

## 2016-02-12 NOTE — Progress Notes (Signed)
Subjective:    Patient ID: Jerome Vaughn, male    DOB: August 17, 1951, 65 y.o.   MRN: [id number]  HPI Pt returns for f/u of diabetes mellitus: DM type: 2 Dx'ed: 3500 Complications: CAD.   Therapy: 4 oral meds.  DKA: never Severe hypoglycemia: never.  Pancreatitis: never.  Other: he has never been on insulin; he could not afford onglyza; he stopped pioglitizone, due to edema  Interval history: He does not take pioglitizone.  no cbg record, but states cbg's are in the low-100's.  pt states he feels well in general.  He has learned how to take insulin today.   Past Medical History  Diagnosis Date  . Diabetes mellitus   . Hyperlipidemia   . Hypertension   . Carpal tunnel syndrome     both hands  . Coronary artery disease     a. Cath 11/21/2015: Multivessel CAD --> CABG recommended. b. CABG on 11/22/2015:  LIMA-LAD, SVG-Diag, SVG-OM1 and distal Cx, and SVG-PDA.  Marland Kitchen History of echocardiogram     a. Echo 4/17: Moderate LVH, EF 50-55%, mild LAE, no pericardial effusion    Past Surgical History  Procedure Laterality Date  . Wrist ganglion excision    . Frozen shoulder release    . Colonsocopy  2012  . Cardiac catheterization N/A 11/21/2015    Procedure: Left Heart Cath and Coronary Angiography;  Surgeon: [name], MD;  Location: [facility];  Service: Cardiovascular;  Laterality: N/A;  . Coronary artery bypass graft N/A 11/22/2015    Procedure: CORONARY ARTERY BYPASS GRAFTING (CABG) times five using the left internal mammary, right greater saphenous vein EVH, and left thigh greater saphenous vein EVH;  Surgeon: [name], MD;  Location: [facility];  Service: Open Heart Surgery;  Laterality: N/A;  . Tee without cardioversion N/A 11/22/2015    Procedure: TRANSESOPHAGEAL ECHOCARDIOGRAM (TEE);  Surgeon: [name], MD;  Location: [facility];  Service: Open Heart Surgery;  Laterality: N/A;    Social History   Social History  . Marital Status: Married    Spouse Name: N/A  . Number  of Children: 5  . Years of Education: N/A   Occupational History  . Dispatcher    Social History Main Topics  . Smoking status: Former Smoker -- 0.25 packs/[place] for 20 years    Types: Cigarettes    Quit date: 10/07/2013  . Smokeless tobacco: Never Used     Comment: quit cigarettes, might have a cigar 1 X month  . Alcohol Use: 1.8 oz/week    3 Standard drinks or equivalent per week  . Drug Use: No  . Sexual Activity: Not on file   Other Topics Concern  . 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 aspirin EC 325 MG EC tablet Take 1 tablet (325 mg total) by mouth daily.    . Blood Glucose Monitoring Suppl (ACCU-CHEK AVIVA PLUS) W/DEVICE KIT Test once per [place] 1 kit 0  . bromocriptine (PARLODEL) 2.5 MG tablet TAKE 0.5 TABLETS (1.25 MG TOTAL) BY MOUTH AT BEDTIME    . CINNAMON PO Take 2,000 mg by mouth daily.     . empagliflozin (JARDIANCE) 25 MG TABS tablet Take 25 mg by  mouth daily. 30 tablet 11  . furosemide (LASIX) 20 MG tablet Take 1 tablet (20 mg total) by mouth daily. 5 tablet 0  . glucose blood (ACCU-CHEK AVIVA PLUS) test strip Diagnosis code is E 11.9 100 each 2  . Lancets (ACCU-CHEK SOFT TOUCH) lancets Dispense for Aviva plus and diagnosis code is E 11.9 100 each 0  . metFORMIN (GLUCOPHAGE-XR) 500 MG 24 hr tablet TAKE 4 TABLETS DAILY WITH BREAKFAST 360 tablet 2  . metoprolol tartrate (LOPRESSOR) 25 MG tablet Take 1.5 tablets (37.5 mg total) by mouth 2 (two) times daily. 270 tablet 3  . Multiple Vitamin (MULTIVITAMIN) tablet Take 1 tablet by mouth daily.      . Omega-3 Fatty Acids (FISH OIL) 1000 MG CAPS Take 1,000 mg by mouth daily.     . rosuvastatin (CRESTOR) 40 MG tablet Take 40 mg by mouth daily.     No current facility-administered  medications on file prior to visit.    No Known Allergies  Family History  Problem Relation Age of Onset  . Hyperlipidemia Sister   . Diabetes Sister   . Hypertension Sister   . Anuerysm Brother   . Diabetes Maternal Grandmother   . Hyperlipidemia Sister   . Kidney disease Sister   . Heart attack Neg Hx     BP 122/80 mmHg  Pulse 76  Temp(Src) 98.5 F (36.9 C) (Oral)  Wt 190 lb (86.183 kg)  SpO2 96%  Review of Systems He denies hypoglycemia    Objective:   Physical Exam VITAL SIGNS:  See vs page GENERAL: no distress SKIN:  Insulin injection sites at the anterior abdomen are normal   A1c=7.4%    Assessment & Plan:  DM: Needs increased rx, if it can be done with a regimen that avoids or minimizes hypoglycemia.  Patient is advised the following: Patient Instructions  check your blood sugar once a [place].  vary the time of [place] when you check, between before the 3 meals, and at bedtime.  also check if you have symptoms of your blood sugar being too high or too low.  please keep a record of the readings and bring it to your next appointment here.  You can write it on any piece of paper.  please call us sooner if your blood sugar goes below 70, or if you have a lot of readings over 200.    Please change the glipizide to "tradjenta."  i have sent a prescription to your pharmacy.   If your blood sugar goes up, the next option would be to add "repaglinide."  An alternative would be to change the tradjenta to "trulicity" (once a week injection).   Our goals are to get the a1c less than 7, without low blood sugar.   blood tests are requested for you today.  We'll let you know about the results. Please come back for a follow-up appointment in 2 months.

## 2016-02-12 NOTE — Patient Instructions (Signed)
1.  Suggested he test 2hr. After breakfast and if over 170, stop the juice.  2. Call if questions when starting insulin

## 2016-02-13 ENCOUNTER — Telehealth: Payer: Self-pay | Admitting: Endocrinology

## 2016-02-13 ENCOUNTER — Encounter: Payer: Self-pay | Admitting: Cardiothoracic Surgery

## 2016-02-13 ENCOUNTER — Ambulatory Visit (INDEPENDENT_AMBULATORY_CARE_PROVIDER_SITE_OTHER): Payer: Self-pay | Admitting: Cardiothoracic Surgery

## 2016-02-13 ENCOUNTER — Ambulatory Visit
Admission: RE | Admit: 2016-02-13 | Discharge: 2016-02-13 | Disposition: A | Discharge status: Home / Self Care | Payer: BLUE CROSS/BLUE SHIELD | Source: Ambulatory Visit | Attending: Cardiothoracic Surgery | Admitting: Cardiothoracic Surgery

## 2016-02-13 ENCOUNTER — Other Ambulatory Visit: Payer: Self-pay

## 2016-02-13 ENCOUNTER — Other Ambulatory Visit: Payer: BLUE CROSS/BLUE SHIELD

## 2016-02-13 VITALS — BP 118/80 | HR 71 | Resp 16 | Ht 67.5 in | Wt 190.0 lb

## 2016-02-13 DIAGNOSIS — E291 Testicular hypofunction: Secondary | ICD-10-CM | POA: Insufficient documentation

## 2016-02-13 DIAGNOSIS — M7989 Other specified soft tissue disorders: Secondary | ICD-10-CM

## 2016-02-13 DIAGNOSIS — Z951 Presence of aortocoronary bypass graft: Secondary | ICD-10-CM

## 2016-02-13 NOTE — Telephone Encounter (Signed)
See note below. Viagra rx was submitted yesterday, but the lasix rx is currently under another provider. Can we refill this med? Thanks!

## 2016-02-13 NOTE — Telephone Encounter (Signed)
Pt advised we cannot refill the Lasix. Pt advised to contact PCP.

## 2016-02-13 NOTE — Telephone Encounter (Signed)
Attempted to reach the pt. Pt was not available. Will try again at a later time.

## 2016-02-13 NOTE — Progress Notes (Signed)
PCP is [name], MD Referring Provider is [name], MD  Chief Complaint  Patient presents with  . Routine Post Op    s/p CABG with a cxr...ready to RTW    HPI:the patient has  done well after multivessel CABG done less than 3 months ago for unstable angina. His right lower extremity Swelling has resolved. The ultrasound was negative for DVT performed in March.the patient is walking 2 miles daily without recurrent angina or symptoms of CHF. Surgical incisions are well-healed. The patient does note some slight clicking-popping sensation in the sternum when he bends or lifts heavy objects. The patient understands that he should not lift more than 20 pounds until over 3 months after the date of surgery to allow proper sternal healing.  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 Diabetes mellitus   . Hyperlipidemia   . Hypertension   . Carpal tunnel syndrome     both hands  . Coronary artery disease     a. Cath 11/21/2015: Multivessel CAD --> CABG recommended. b. CABG on 11/22/2015:  LIMA-LAD, SVG-Diag, SVG-OM1 and distal Cx, and SVG-PDA.  Marland Kitchen History of echocardiogram     a. Echo 4/17: Moderate LVH, EF 50-55%, mild LAE, no pericardial effusion    Past Surgical History  Procedure Laterality Date  . Wrist ganglion excision    . Frozen shoulder release    . Colonsocopy  2012  . Cardiac catheterization N/A 11/21/2015    Procedure: Left Heart Cath and Coronary Angiography;  Surgeon: [name], MD;  Location: [facility];  Service:  Cardiovascular;  Laterality: N/A;  . Coronary artery bypass graft N/A 11/22/2015    Procedure: CORONARY ARTERY BYPASS GRAFTING (CABG) times five using the left internal mammary, right greater saphenous vein EVH, and left thigh greater saphenous vein EVH;  Surgeon: [name], MD;  Location: [facility];  Service: Open Heart Surgery;  Laterality: N/A;  . Tee without cardioversion N/A 11/22/2015    Procedure: TRANSESOPHAGEAL ECHOCARDIOGRAM (TEE);  Surgeon: [name], MD;  Location: [facility];  Service: Open Heart Surgery;  Laterality: N/A;    Family History  Problem Relation Age of Onset  . Hyperlipidemia Sister   . Diabetes Sister   . Hypertension Sister   . Anuerysm Brother   . Diabetes Maternal Grandmother   . Hyperlipidemia Sister   . Kidney disease Sister   . Heart attack Neg Hx     Social History Social History  Substance Use Topics  . Smoking status: Former Smoker -- 0.25 packs/[place] for 20 years    Types: Cigarettes    Quit date: 10/07/2013  . Smokeless tobacco: Never Used     Comment: quit cigarettes, might have a cigar 1 X month  . Alcohol Use: 1.8 oz/week    3 Standard drinks or equivalent per week    Current Outpatient Prescriptions  Medication Sig Dispense Refill  . aspirin EC 325 MG EC tablet Take 1 tablet (325 mg total) by mouth daily.    Marland Kitchen  Blood Glucose Monitoring Suppl (ACCU-CHEK AVIVA PLUS) W/DEVICE KIT Test once per [place] 1 kit 0  . bromocriptine (PARLODEL) 2.5 MG tablet TAKE 0.5 TABLETS (1.25 MG TOTAL) BY MOUTH AT BEDTIME    . CINNAMON PO Take 2,000 mg by mouth daily.     . empagliflozin (JARDIANCE) 25 MG TABS tablet Take 25 mg by mouth daily. 30 tablet 11  . furosemide (LASIX) 20 MG tablet Take 1 tablet (20 mg total) by mouth daily. 5 tablet 0  . glucose blood (ACCU-CHEK AVIVA PLUS) test strip Diagnosis code is E 11.9 100 each 2  . Lancets (ACCU-CHEK SOFT TOUCH) lancets Dispense for Aviva plus and diagnosis code is E 11.9 100 each 0  . linagliptin (TRADJENTA) 5  MG TABS tablet Take 1 tablet (5 mg total) by mouth daily. 30 tablet 11  . metFORMIN (GLUCOPHAGE-XR) 500 MG 24 hr tablet TAKE 4 TABLETS DAILY WITH BREAKFAST 360 tablet 2  . metoprolol tartrate (LOPRESSOR) 25 MG tablet Take 1.5 tablets (37.5 mg total) by mouth 2 (two) times daily. 270 tablet 3  . Multiple Vitamin (MULTIVITAMIN) tablet Take 1 tablet by mouth daily.      . Omega-3 Fatty Acids (FISH OIL) 1000 MG CAPS Take 1,000 mg by mouth daily.     . rosuvastatin (CRESTOR) 40 MG tablet Take 40 mg by mouth daily.    . sildenafil (VIAGRA) 100 MG tablet Take 0.5-1 tablets (50-100 mg total) by mouth daily as needed for erectile dysfunction. 10 tablet 11   No current facility-administered medications for this visit.    No Known Allergies  Review of Systems  Doing very well with increased exercise tolerance, no cardiac symptoms, surgical incision is well-healed  BP 118/80 mmHg  Pulse 71  Resp 16  Ht 5' 7.5" (1.715 m)  Wt 190 lb (86.183 kg)  BMI 29.30 kg/m2  SpO2 98% Physical Exam Alert and comfortable Lungs clear Heart rate regular without gallop or murmur Sternum stable well-healed Right leg incision well-healed, no pedal edema Neuro intact  Diagnostic Tests: Postop chest x-ray taken in march reviewed and is clear  Impression: Doing well recovery after multivessel bypass surgery. Complaints of sternal click-pop sensation are signs that the sternal needs more time to heal. We'll move his return to work date back to June 5.  Plan: He will be followed by his cardiologist and primary care physician for his further followup.  Len Childs, MD [facility] [phone]

## 2016-02-13 NOTE — Telephone Encounter (Signed)
Patient need a PA for Viagra, and a refill for medication furosemide (LASIX) 20 MG tablet send to  [id number] - [place], [place] - [place] [id number] (Phone) [phone] (Fax)

## 2016-02-13 NOTE — Telephone Encounter (Signed)
No, i am not the prescriber for lasix

## 2016-02-14 LAB — TESTOSTERONE,FREE AND TOTAL
Testosterone, Free: 7 pg/mL (ref 6.6–18.1)
Testosterone: 185 ng/dL — ABNORMAL LOW (ref 348–1197)

## 2016-02-14 LAB — SPECIMEN STATUS REPORT

## 2016-02-16 LAB — TSH: TSH: 0.842 u[IU]/mL (ref 0.450–4.500)

## 2016-02-16 LAB — SPECIMEN STATUS REPORT

## 2016-02-16 LAB — PROLACTIN: PROLACTIN: 4.3 ng/mL (ref 4.0–15.2)

## 2016-02-16 LAB — LUTEINIZING HORMONE: LH: 3.5 m[IU]/mL (ref 1.7–8.6)

## 2016-03-14 IMAGING — CR DG CHEST 1V PORT
1 series · 1 of 1 positions shown · non-contrast
Comparison: 11/22/2015

CLINICAL DATA: CABG.

EXAM:
PORTABLE CHEST 1 VIEW

[AP]
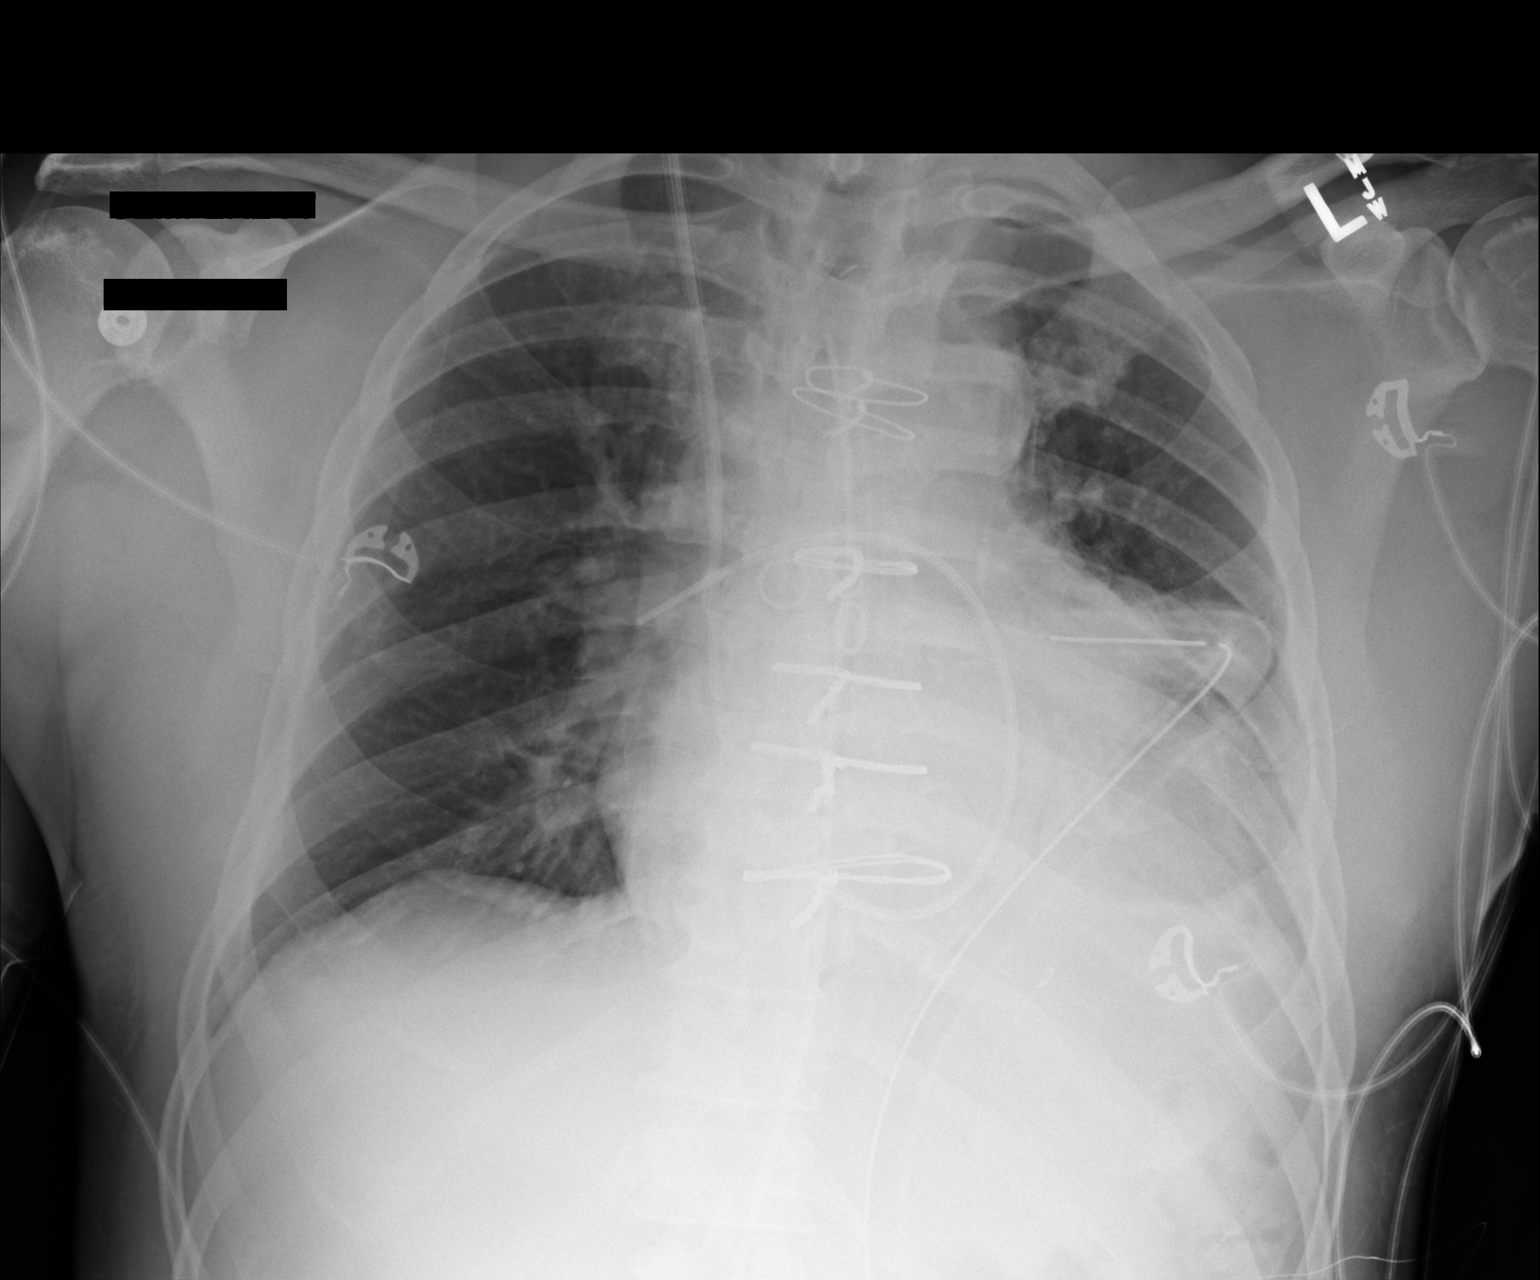

[1 of 1 positions shown; findings below may reference images not displayed]

FINDINGS: Interim extubation. Interim removal of NG tube. Swan-Ganz catheter
tip noted over the right main pulmonary artery on today's exam.
Mediastinal drainage catheter and left chest tube in stable
position. Cardiomegaly with normal pulmonary vascularity . Left base
atelectasis and tiny left pleural effusion. No pneumothorax.
IMPRESSION: 1. Interim extubation and removal of NG tube.
2. Swan-Ganz catheter tip noted over right main pulmonary artery.
Mediastinal drainage catheter and left chest tube in stable
position. No pneumothorax.
3. Prior CABG. Stable cardiomegaly. No pulmonary venous congestion.
4. Mild left base atelectasis and tiny left pleural effusion
scratched noted.

## 2016-03-20 ENCOUNTER — Other Ambulatory Visit: Payer: Self-pay | Admitting: Family Medicine

## 2016-03-20 NOTE — Telephone Encounter (Signed)
Refill sent to pharmacy.   

## 2016-04-14 ENCOUNTER — Ambulatory Visit (INDEPENDENT_AMBULATORY_CARE_PROVIDER_SITE_OTHER): Payer: BLUE CROSS/BLUE SHIELD | Admitting: Endocrinology

## 2016-04-14 ENCOUNTER — Encounter: Payer: Self-pay | Admitting: Endocrinology

## 2016-04-14 VITALS — BP 104/72 | HR 94 | Wt 193.8 lb

## 2016-04-14 DIAGNOSIS — E138 Other specified diabetes mellitus with unspecified complications: Secondary | ICD-10-CM

## 2016-04-14 LAB — POCT GLYCOSYLATED HEMOGLOBIN (HGB A1C): HEMOGLOBIN A1C: 6.9

## 2016-04-14 NOTE — Patient Instructions (Signed)
check your blood sugar once a Patry.  vary the time of Asmus when you check, between before the 3 meals, and at bedtime.  also check if you have symptoms of your blood sugar being too high or too low.  please keep a record of the readings and bring it to your next appointment here.  You can write it on any piece of paper.  please call us sooner if your blood sugar goes below 70, or if you have a lot of readings over 200. Please continue the same medications. Please come back for a follow-up appointment in 3-4 months.   

## 2016-04-14 NOTE — Progress Notes (Signed)
Subjective:    Patient ID: Jerome Vaughn, male    DOB: [birth year], 65 y.o.   MRN: [id number]  HPI Pt returns for f/u of diabetes mellitus: DM type: 2 Dx'ed: 4709 Complications: CAD.   Therapy: 4 oral meds.  DKA: never Severe hypoglycemia: never.  Pancreatitis: never.  Other: he has never been on insulin, but he has learned how; he could not afford onglyza; he stopped pioglitizone, due to edema  Interval history: pt states he feels well in general.  He takes meds as rx'ed.  no cbg record, but states cbg's are well-controlled.  Past Medical History  Diagnosis Date  . Diabetes mellitus   . Hyperlipidemia   . Hypertension   . Carpal tunnel syndrome     both hands  . Coronary artery disease     a. Cath 11/21/2015: Multivessel CAD --> CABG recommended. b. CABG on 11/22/2015:  LIMA-LAD, SVG-Diag, SVG-OM1 and distal Cx, and SVG-PDA.  Marland Kitchen History of echocardiogram     a. Echo 4/17: Moderate LVH, EF 50-55%, mild LAE, no pericardial effusion    Past Surgical History  Procedure Laterality Date  . Wrist ganglion excision    . Frozen shoulder release    . Colonsocopy  2012  . Cardiac catheterization N/A 11/21/2015    Procedure: Left Heart Cath and Coronary Angiography;  Surgeon: [name], MD;  Location: [facility];  Service: Cardiovascular;  Laterality: N/A;  . Coronary artery bypass graft N/A 11/22/2015    Procedure: CORONARY ARTERY BYPASS GRAFTING (CABG) times five using the left internal mammary, right greater saphenous vein EVH, and left thigh greater saphenous vein EVH;  Surgeon: [name], MD;  Location: [facility];  Service: Open Heart Surgery;  Laterality: N/A;  . Tee without cardioversion N/A 11/22/2015    Procedure: TRANSESOPHAGEAL ECHOCARDIOGRAM (TEE);  Surgeon: [name], MD;  Location: [facility];  Service: Open Heart Surgery;  Laterality: N/A;    Social History   Social History  . Marital Status: Married    Spouse Name: N/A  . Number of Children: 5  . Years of  Education: N/A   Occupational History  . Dispatcher    Social History Main Topics  . Smoking status: Former Smoker -- 0.25 packs/[place] for 20 years    Types: Cigarettes    Quit date: 10/07/2013  . Smokeless tobacco: Never Used     Comment: quit cigarettes, might have a cigar 1 X month  . Alcohol Use: 1.8 oz/week    3 Standard drinks or equivalent per week  . Drug Use: No  . Sexual Activity: Not on file   Other Topics Concern  . 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 aspirin EC 325 MG EC tablet Take 1 tablet (325 mg total) by mouth daily.    . Blood Glucose Monitoring Suppl (ACCU-CHEK AVIVA PLUS) W/DEVICE KIT Test once per [place] 1 kit 0  . bromocriptine (PARLODEL) 2.5 MG tablet TAKE 0.5 TABLETS (1.25 MG TOTAL) BY MOUTH AT BEDTIME    . CINNAMON PO Take 2,000 mg by mouth daily.     . empagliflozin (JARDIANCE) 25 MG TABS tablet Take 25 mg by mouth daily. 30 tablet 11  .  furosemide (LASIX) 20 MG tablet Take 1 tablet (20 mg total) by mouth daily. 5 tablet 0  . glucose blood (ACCU-CHEK AVIVA PLUS) test strip Diagnosis code is E 11.9 100 each 2  . Lancets (ACCU-CHEK SOFT TOUCH) lancets Dispense for Aviva plus and diagnosis code is E 11.9 100 each 0  . linagliptin (TRADJENTA) 5 MG TABS tablet Take 1 tablet (5 mg total) by mouth daily. 30 tablet 11  . metFORMIN (GLUCOPHAGE-XR) 500 MG 24 hr tablet TAKE 4 TABLETS DAILY WITH BREAKFAST 360 tablet 2  . metoprolol tartrate (LOPRESSOR) 25 MG tablet Take 1.5 tablets (37.5 mg total) by mouth 2 (two) times daily. 270 tablet 3  . Multiple Vitamin (MULTIVITAMIN) tablet Take 1 tablet by mouth daily.      . Omega-3 Fatty Acids (FISH OIL) 1000 MG CAPS Take 1,000 mg by mouth daily.     . rosuvastatin (CRESTOR) 40 MG  tablet TAKE 1 TABLET DAILY AS DIRECTED FOR CHOLESTEROL 30 tablet 0  . sildenafil (VIAGRA) 100 MG tablet Take 0.5-1 tablets (50-100 mg total) by mouth daily as needed for erectile dysfunction. 10 tablet 11   No current facility-administered medications on file prior to visit.   No Known Allergies  Family History  Problem Relation Age of Onset  . Hyperlipidemia Sister   . Diabetes Sister   . Hypertension Sister   . Anuerysm Brother   . Diabetes Maternal Grandmother   . Hyperlipidemia Sister   . Kidney disease Sister   . Heart attack Neg Hx     BP 104/72 mmHg  Pulse 94  Wt 193 lb 12.8 oz (87.907 kg)  SpO2 94%  Review of Systems He denies hypoglycemia    Objective:   Physical Exam VITAL SIGNS:  See vs page GENERAL: no distress Pulses: dorsalis pedis intact bilat.   MSK: no deformity of the feet CV: 1+ bilat leg edema Skin:  no ulcer on the feet.  normal color and temp on the feet. Neuro: sensation is intact to touch on the feet.  A1c=6.9%    Assessment & Plan:  Type 2 DM: he needs increased rx, if it can be done with a regimen that avoids or minimizes hypoglycemia.  He declines to add repaglinide.    Patient is advised the following: Patient Instructions  check your blood sugar once a Bankert.  vary the time of Myler when you check, between before the 3 meals, and at bedtime.  also check if you have symptoms of your blood sugar being too high or too low.  please keep a record of the readings and bring it to your next appointment here.  You can write it on any piece of paper.  please call us sooner if your blood sugar goes below 70, or if you have a lot of readings over 200.   Please continue the same medications  Please come back for a follow-up appointment in 3-4 months.     Jerome Shin, MD

## 2016-05-27 ENCOUNTER — Encounter: Payer: Self-pay | Admitting: Family Medicine

## 2016-05-27 ENCOUNTER — Ambulatory Visit (INDEPENDENT_AMBULATORY_CARE_PROVIDER_SITE_OTHER): Payer: BLUE CROSS/BLUE SHIELD | Admitting: Family Medicine

## 2016-05-27 VITALS — BP 124/87 | Temp 97.9°F | Ht 67.5 in | Wt 198.0 lb

## 2016-05-27 DIAGNOSIS — E785 Hyperlipidemia, unspecified: Secondary | ICD-10-CM | POA: Diagnosis not present

## 2016-05-27 DIAGNOSIS — N401 Enlarged prostate with lower urinary tract symptoms: Secondary | ICD-10-CM | POA: Diagnosis not present

## 2016-05-27 DIAGNOSIS — E1142 Type 2 diabetes mellitus with diabetic polyneuropathy: Secondary | ICD-10-CM

## 2016-05-27 DIAGNOSIS — N138 Other obstructive and reflux uropathy: Secondary | ICD-10-CM

## 2016-05-27 DIAGNOSIS — I2510 Atherosclerotic heart disease of native coronary artery without angina pectoris: Secondary | ICD-10-CM | POA: Diagnosis not present

## 2016-05-27 DIAGNOSIS — I10 Essential (primary) hypertension: Secondary | ICD-10-CM | POA: Diagnosis not present

## 2016-05-27 LAB — HEPATIC FUNCTION PANEL
ALBUMIN: 4.5 g/dL (ref 3.5–5.2)
ALK PHOS: 35 U/L — AB (ref 39–117)
ALT: 19 U/L (ref 0–53)
AST: 19 U/L (ref 0–37)
Bilirubin, Direct: 0.1 mg/dL (ref 0.0–0.3)
TOTAL PROTEIN: 8 g/dL (ref 6.0–8.3)
Total Bilirubin: 0.4 mg/dL (ref 0.2–1.2)

## 2016-05-27 LAB — BASIC METABOLIC PANEL
BUN: 17 mg/dL (ref 6–23)
CHLORIDE: 103 meq/L (ref 96–112)
CO2: 27 mEq/L (ref 19–32)
CREATININE: 1.15 mg/dL (ref 0.40–1.50)
Calcium: 9.3 mg/dL (ref 8.4–10.5)
GFR: 82.65 mL/min (ref >60.00)
GLUCOSE: 140 mg/dL — AB (ref 70–99)
POTASSIUM: 4.6 meq/L (ref 3.5–5.1)
Sodium: 137 mEq/L (ref 135–145)

## 2016-05-27 LAB — LIPID PANEL
CHOL/HDL RATIO: 4
CHOLESTEROL: 207 mg/dL — AB (ref 0–200)
HDL: 55.2 mg/dL (ref >39.00)
LDL Cholesterol: 127 mg/dL — ABNORMAL HIGH (ref 0–99)
NonHDL: 151.35
TRIGLYCERIDES: 120 mg/dL (ref 0.0–149.0)
VLDL: 24 mg/dL (ref 0.0–40.0)

## 2016-05-27 LAB — CBC WITH DIFFERENTIAL/PLATELET
BASOS ABS: 0 10*3/uL (ref 0.0–0.1)
Basophils Relative: 0.6 % (ref 0.0–3.0)
EOS ABS: 0.1 10*3/uL (ref 0.0–0.7)
EOS PCT: 1.6 % (ref 0.0–5.0)
HCT: 41.2 % (ref 39.0–52.0)
HEMOGLOBIN: 13.9 g/dL (ref 13.0–17.0)
LYMPHS ABS: 1.3 10*3/uL (ref 0.7–4.0)
Lymphocytes Relative: 30.6 % (ref 12.0–46.0)
MCHC: 33.7 g/dL (ref 30.0–36.0)
MCV: 82.4 fl (ref 78.0–100.0)
MONO ABS: 0.5 10*3/uL (ref 0.1–1.0)
Monocytes Relative: 10.5 % (ref 3.0–12.0)
NEUTROS PCT: 56.7 % (ref 43.0–77.0)
Neutro Abs: 2.4 10*3/uL (ref 1.4–7.7)
Platelets: 140 10*3/uL — ABNORMAL LOW (ref 150.0–400.0)
RBC: 5.01 Mil/uL (ref 4.22–5.81)
RDW: 16.5 % — ABNORMAL HIGH (ref 11.5–15.5)
WBC: 4.3 10*3/uL (ref 4.0–10.5)

## 2016-05-27 LAB — PSA: PSA: 0.34 ng/mL (ref 0.10–4.00)

## 2016-05-27 MED ORDER — ROSUVASTATIN CALCIUM 40 MG PO TABS: ORAL_TABLET | ORAL | 3 refills | Status: DC

## 2016-05-27 NOTE — Progress Notes (Signed)
Pre visit review using our clinic review tool, if applicable. No additional management support is needed unless otherwise documented below in the visit note. 

## 2016-05-27 NOTE — Progress Notes (Signed)
   Subjective:    Patient ID: Jerome Vaughn, male    DOB: 01/06/1951, 64 y.o.   MRN: [id number]  HPI Here to follow up. He feels fine and has no complaints. In March he had a 5 vessel CABG and he has done very well since then. He went back to  To work full time in June. He sees Dr. Loanne Drilling for the diabetes and this has been stable. His last A1c was 6.9. He sees Cardiology regularly. He has not had his lipids checked for several years however.    Review of Systems  Constitutional: Negative.   Respiratory: Negative.   Cardiovascular: Negative.   Genitourinary: Negative.   Neurological: Negative.        Objective:   Physical Exam  Constitutional: He is oriented to person, place, and time. He appears well-developed and well-nourished.  Neck: No thyromegaly present.  Cardiovascular: Normal rate, regular rhythm, normal heart sounds and intact distal pulses.   Pulmonary/Chest: Effort normal and breath sounds normal.  Abdominal: Soft. Bowel sounds are normal. He exhibits no distension and no mass. There is no tenderness. There is no rebound and no guarding.  Genitourinary: Rectum normal, prostate normal and penis normal.  Lymphadenopathy:    He has no cervical adenopathy.  Neurological: He is alert and oriented to person, place, and time.          Assessment & Plan:  He is doing well in general. His cardiovascular status is stable. Get fasting labs today including lipids. His diabetes is stable.  [name], MD

## 2016-08-14 ENCOUNTER — Ambulatory Visit (INDEPENDENT_AMBULATORY_CARE_PROVIDER_SITE_OTHER): Payer: BLUE CROSS/BLUE SHIELD | Admitting: Endocrinology

## 2016-08-14 ENCOUNTER — Encounter: Payer: Self-pay | Admitting: Endocrinology

## 2016-08-14 VITALS — BP 110/76 | HR 72 | Ht 67.0 in | Wt 186.5 lb

## 2016-08-14 DIAGNOSIS — I2510 Atherosclerotic heart disease of native coronary artery without angina pectoris: Secondary | ICD-10-CM | POA: Diagnosis not present

## 2016-08-14 DIAGNOSIS — E089 Diabetes mellitus due to underlying condition without complications: Secondary | ICD-10-CM

## 2016-08-14 LAB — POCT GLYCOSYLATED HEMOGLOBIN (HGB A1C): Hemoglobin A1C: 6.3

## 2016-08-14 NOTE — Progress Notes (Signed)
Subjective:    Patient ID: Jerome Vaughn, male    DOB: 09/08/1951, 65 y.o.   MRN: [id number]  HPI Pt returns for f/u of diabetes mellitus: DM type: 2 Dx'ed: [id number] Complications: CAD.   Therapy: 4 oral meds.  DKA: never Severe hypoglycemia: never.  Pancreatitis: never.  Other: he has never been on insulin, but he has learned how; he could not afford onglyza; he stopped pioglitizone, due to edema. Interval history: He is still on glipizide. pt states he feels well in general.   Past Medical History:  Diagnosis Date  . Carpal tunnel syndrome    both hands  . Coronary artery disease    a. Cath 11/21/2015: Multivessel CAD --> CABG recommended. b. CABG on 11/22/2015:  LIMA-LAD, SVG-Diag, SVG-OM1 and distal Cx, and SVG-PDA.  . Diabetes mellitus   . History of echocardiogram    a. Echo 4/17: Moderate LVH, EF 50-55%, mild LAE, no pericardial effusion  . Hyperlipidemia   . Hypertension     Past Surgical History:  Procedure Laterality Date  . CARDIAC CATHETERIZATION N/A 11/21/2015   Procedure: Left Heart Cath and Coronary Angiography;  Surgeon: [name], MD;  Location: [facility];  Service: Cardiovascular;  Laterality: N/A;  . colonsocopy  2012  . CORONARY ARTERY BYPASS GRAFT N/A 11/22/2015   Procedure: CORONARY ARTERY BYPASS GRAFTING (CABG) times five using the left internal mammary, right greater saphenous vein EVH, and left thigh greater saphenous vein EVH;  Surgeon: [name], MD;  Location: [facility];  Service: Open Heart Surgery;  Laterality: N/A;  . frozen shoulder release    . TEE WITHOUT CARDIOVERSION N/A 11/22/2015   Procedure: TRANSESOPHAGEAL ECHOCARDIOGRAM (TEE);  Surgeon: [name], MD;  Location: [facility];  Service: Open Heart Surgery;  Laterality: N/A;  . WRIST GANGLION EXCISION      Social History   Social History  . Marital status: Married    Spouse name: N/A  . Number of children: 5  . Years of education: N/A   Occupational History  . Dispatcher      Social History Main Topics  . Smoking status: Former Smoker    Packs/[place]: 0.25    Years: 20.00    Types: Cigarettes    Quit date: 10/07/2013  . Smokeless tobacco: Never Used     Comment: quit cigarettes, might have a cigar 1 X month  . Alcohol use 1.8 oz/week    3 Standard drinks or equivalent per week  . Drug use: No  . Sexual activity: Not on file   Other Topics Concern  . 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 aspirin EC 325 MG EC tablet Take 1 tablet (325 mg total) by mouth daily.    . bromocriptine (PARLODEL) 2.5 MG tablet TAKE 0.5 TABLETS (1.25 MG TOTAL) BY MOUTH AT BEDTIME    . CINNAMON PO Take 2,000 mg by mouth daily.     . empagliflozin (JARDIANCE) 25 MG TABS tablet Take 25 mg by mouth daily. 30 tablet 11  . glucose blood (ACCU-CHEK AVIVA PLUS) test strip Diagnosis code is E 11.9 100 each 2  . Lancets (ACCU-CHEK SOFT TOUCH) lancets Dispense for Aviva plus and diagnosis code  is E 11.9 100 each 0  . linagliptin (TRADJENTA) 5 MG TABS tablet Take 1 tablet (5 mg total) by mouth daily. 30 tablet 11  . metFORMIN (GLUCOPHAGE-XR) 500 MG 24 hr tablet TAKE 4 TABLETS DAILY WITH BREAKFAST 360 tablet 2  . metoprolol tartrate (LOPRESSOR) 25 MG tablet Take 1.5 tablets (37.5 mg total) by mouth 2 (two) times daily. 270 tablet 3  . Multiple Vitamin (MULTIVITAMIN) tablet Take 1 tablet by mouth daily.      . Omega-3 Fatty Acids (FISH OIL) 1000 MG CAPS Take 1,000 mg by mouth daily.     . rosuvastatin (CRESTOR) 40 MG tablet TAKE 1 TABLET DAILY AS DIRECTED FOR CHOLESTEROL 90 tablet 3   No current facility-administered medications on file prior to visit.     No Known Allergies  Family History  Problem Relation Age of Onset  . Anuerysm Brother   .  Hyperlipidemia Sister   . Diabetes Sister   . Hypertension Sister   . Diabetes Maternal Grandmother   . Hyperlipidemia Sister   . Kidney disease Sister   . Heart attack Neg Hx     BP 110/76   Pulse 72   Ht 5\' 7"  (1.702 m)   Wt 186 lb 8 oz (84.6 kg)   SpO2 96%   BMI 29.21 kg/m    Review of Systems He denies hypoglycemia    Objective:   Physical Exam VITAL SIGNS:  See vs page GENERAL: no distress Pulses: dorsalis pedis intact bilat.   MSK: no deformity of the feet CV: no leg edema Skin:  no ulcer on the feet.  normal color and temp on the feet. Neuro: sensation is intact to touch on the feet.     A1c=6.3%    Assessment & Plan:  Type 2 DM, with CAD: overcontrolled.  Patient is advised the following: Patient Instructions  check your blood sugar once a Brunson.  vary the time of Debruler when you check, between before the 3 meals, and at bedtime.  also check if you have symptoms of your blood sugar being too high or too low.  please keep a record of the readings and bring it to your next appointment here.  You can write it on any piece of paper.  please call us sooner if your blood sugar goes below 70, or if you have a lot of readings over 200.   Please stop taking the glipizide, and continue the same other medications Please come back for a follow-up appointment in 3 months.

## 2016-08-14 NOTE — Patient Instructions (Signed)
check your blood sugar once a Westerlund.  vary the time of Folkes when you check, between before the 3 meals, and at bedtime.  also check if you have symptoms of your blood sugar being too high or too low.  please keep a record of the readings and bring it to your next appointment here.  You can write it on any piece of paper.  please call us sooner if your blood sugar goes below 70, or if you have a lot of readings over 200.   Please stop taking the glipizide, and continue the same other medications Please come back for a follow-up appointment in 3 months.

## 2016-09-02 ENCOUNTER — Ambulatory Visit (INDEPENDENT_AMBULATORY_CARE_PROVIDER_SITE_OTHER): Payer: BLUE CROSS/BLUE SHIELD | Admitting: Family Medicine

## 2016-09-02 ENCOUNTER — Encounter: Payer: Self-pay | Admitting: Family Medicine

## 2016-09-02 VITALS — BP 125/76 | HR 85 | Temp 98.2°F | Ht 67.0 in | Wt 188.0 lb

## 2016-09-02 DIAGNOSIS — S39012A Strain of muscle, fascia and tendon of lower back, initial encounter: Secondary | ICD-10-CM | POA: Diagnosis not present

## 2016-09-02 MED ORDER — SILDENAFIL CITRATE 50 MG PO TABS: 50.0000 mg | ORAL_TABLET | Freq: Every day | ORAL | 11 refills | Status: DC | PRN

## 2016-09-02 MED ORDER — METHYLPREDNISOLONE 4 MG PO TBPK: ORAL_TABLET | ORAL | 0 refills | Status: DC

## 2016-09-02 MED ORDER — CYCLOBENZAPRINE HCL 10 MG PO TABS: 10.0000 mg | ORAL_TABLET | Freq: Three times a day (TID) | ORAL | 2 refills | Status: DC | PRN

## 2016-09-02 NOTE — Progress Notes (Signed)
   Subjective:    Patient ID: Jerome Vaughn, male    DOB: 06/19/1951, 65 y.o.   MRN: [id number]  HPI Here for 2 weeks of sharp low back pain and stiffness. No prior hx of back trouble. On this Vastine he bent over to pick up something and felt a sudden sharp pain. The pain radiates to the buttocks but not done the legs. Using heat and [facility].    Review of Systems  Constitutional: Negative.   Respiratory: Negative.   Cardiovascular: Negative.   Musculoskeletal: Positive for back pain.  Neurological: Negative.        Objective:   Physical Exam  Constitutional: He appears well-developed and well-nourished.  Cardiovascular: Normal rate, regular rhythm, normal heart sounds and intact distal pulses.   Pulmonary/Chest: Effort normal and breath sounds normal.  Musculoskeletal:  Mildly tender in the lower back with reduced ROM. Negative SLR          Assessment & Plan:  Low back strain. Try Flexeril and a Medrol dose pack.  [name], MD

## 2016-09-02 NOTE — Progress Notes (Signed)
Pre visit review using our clinic review tool, if applicable. No additional management support is needed unless otherwise documented below in the visit note. 

## 2016-09-17 ENCOUNTER — Other Ambulatory Visit: Payer: Self-pay | Admitting: Endocrinology

## 2016-09-30 ENCOUNTER — Telehealth: Payer: Self-pay | Admitting: Endocrinology

## 2016-09-30 MED ORDER — BROMOCRIPTINE MESYLATE 2.5 MG PO TABS: ORAL_TABLET | ORAL | 2 refills | Status: DC

## 2016-09-30 NOTE — Telephone Encounter (Signed)
Refill submitted. 

## 2016-09-30 NOTE — Telephone Encounter (Signed)
bromcriptine needs to be called [facility]

## 2016-11-19 ENCOUNTER — Encounter: Payer: Self-pay | Admitting: Endocrinology

## 2016-11-19 ENCOUNTER — Ambulatory Visit (INDEPENDENT_AMBULATORY_CARE_PROVIDER_SITE_OTHER): Payer: 59 | Admitting: Endocrinology

## 2016-11-19 VITALS — BP 122/82 | HR 72 | Ht 67.0 in | Wt 183.0 lb

## 2016-11-19 DIAGNOSIS — E1142 Type 2 diabetes mellitus with diabetic polyneuropathy: Secondary | ICD-10-CM | POA: Diagnosis not present

## 2016-11-19 LAB — POCT GLYCOSYLATED HEMOGLOBIN (HGB A1C): Hemoglobin A1C: 6.5

## 2016-11-19 MED ORDER — DAPAGLIFLOZIN PROPANEDIOL 10 MG PO TABS: 10.0000 mg | ORAL_TABLET | Freq: Every day | ORAL | 11 refills | Status: DC

## 2016-11-19 MED ORDER — SITAGLIPTIN PHOSPHATE 100 MG PO TABS: 100.0000 mg | ORAL_TABLET | Freq: Every day | ORAL | 11 refills | Status: DC

## 2016-11-19 NOTE — Progress Notes (Signed)
Subjective:    Patient ID: Jerome Vaughn, male    DOB: 14-Nov-1950, 66 y.o.   MRN: [id number]  HPI Pt returns for f/u of diabetes mellitus: DM type: 2 Dx'ed: 123XX123 Complications: CAD.   Therapy: 4 oral meds.  DKA: never Severe hypoglycemia: never.  Pancreatitis: never.  Other: he has never been on insulin, but he has learned how; he could not afford onglyza; he stopped pioglitizone, due to edema. Interval history: pt states he feels well in general.  He says ins does not cover jardiance or tradjenta. Past Medical History:  Diagnosis Date  . Carpal tunnel syndrome    both hands  . Coronary artery disease    a. Cath 11/21/2015: Multivessel CAD --> CABG recommended. b. CABG on 11/22/2015:  LIMA-LAD, SVG-Diag, SVG-OM1 and distal Cx, and SVG-PDA.  . Diabetes mellitus   . History of echocardiogram    a. Echo 4/17: Moderate LVH, EF 50-55%, mild LAE, no pericardial effusion  . Hyperlipidemia   . Hypertension     Past Surgical History:  Procedure Laterality Date  . CARDIAC CATHETERIZATION N/A 11/21/2015   Procedure: Left Heart Cath and Coronary Angiography;  Surgeon: [name], MD;  Location: [facility];  Service: Cardiovascular;  Laterality: N/A;  . colonsocopy  2012  . CORONARY ARTERY BYPASS GRAFT N/A 11/22/2015   Procedure: CORONARY ARTERY BYPASS GRAFTING (CABG) times five using the left internal mammary, right greater saphenous vein EVH, and left thigh greater saphenous vein EVH;  Surgeon: [name], MD;  Location: [facility];  Service: Open Heart Surgery;  Laterality: N/A;  . frozen shoulder release    . TEE WITHOUT CARDIOVERSION N/A 11/22/2015   Procedure: TRANSESOPHAGEAL ECHOCARDIOGRAM (TEE);  Surgeon: [name], MD;  Location: [facility];  Service: Open Heart Surgery;  Laterality: N/A;  . WRIST GANGLION EXCISION      Social History   Social History  . Marital status: Married    Spouse name: N/A  . Number of children: 5  . Years of education: N/A   Occupational  History  . Dispatcher    Social History Main Topics  . Smoking status: Former Smoker    Packs/Lavalais: 0.25    Years: 20.00    Types: Cigarettes    Quit date: 10/07/2013  . Smokeless tobacco: Never Used     Comment: quit cigarettes, might have a cigar 1 X month  . Alcohol use 1.8 oz/week    3 Standard drinks or equivalent per week  . Drug use: No  . Sexual activity: Not on file   Other Topics Concern  . 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 aspirin EC 325 MG EC tablet Take 1 tablet (325 mg total) by mouth daily.    . bromocriptine (PARLODEL) 2.5 MG tablet TAKE 0.5 TABLETS (1.25 MG TOTAL) BY MOUTH AT BEDTIME 45 tablet 2  . CINNAMON PO Take 2,000 mg by mouth daily.     . cyclobenzaprine (FLEXERIL) 10 MG tablet Take 1 tablet (10 mg total) by mouth 3 (three) times daily as needed for muscle spasms. 60 tablet 2  . glucose blood (ACCU-CHEK AVIVA PLUS) test strip Diagnosis code is E 11.9 100 each 2  .  Lancets (ACCU-CHEK SOFT TOUCH) lancets Dispense for Aviva plus and diagnosis code is E 11.9 100 each 0  . metFORMIN (GLUCOPHAGE-XR) 500 MG 24 hr tablet TAKE 4 TABLETS DAILY WITH BREAKFAST 360 tablet 2  . metoprolol tartrate (LOPRESSOR) 25 MG tablet Take 1.5 tablets (37.5 mg total) by mouth 2 (two) times daily. 270 tablet 3  . Multiple Vitamin (MULTIVITAMIN) tablet Take 1 tablet by mouth daily.      . Omega-3 Fatty Acids (FISH OIL) 1000 MG CAPS Take 1,000 mg by mouth daily.     . rosuvastatin (CRESTOR) 40 MG tablet TAKE 1 TABLET DAILY AS DIRECTED FOR CHOLESTEROL 90 tablet 3  . sildenafil (VIAGRA) 50 MG tablet Take 1 tablet (50 mg total) by mouth daily as needed for erectile dysfunction. 8 tablet 11   No current facility-administered medications on file  prior to visit.     No Known Allergies  Family History  Problem Relation Age of Onset  . Anuerysm Brother   . Hyperlipidemia Sister   . Diabetes Sister   . Hypertension Sister   . Diabetes Maternal Grandmother   . Hyperlipidemia Sister   . Kidney disease Sister   . Heart attack Neg Hx     BP 122/82   Pulse 72   Ht 5\' 7"  (1.702 m)   Wt 183 lb (83 kg)   SpO2 95%   BMI 28.66 kg/m    Review of Systems He denies hypoglycemia.      Objective:   Physical Exam VITAL SIGNS:  See vs page GENERAL: no distress Pulses: dorsalis pedis intact bilat.   MSK: no deformity of the feet CV: no leg edema Skin:  no ulcer on the feet.  normal color and temp on the feet. Neuro: sensation is intact to touch on the feet  A1c=6.5%     Assessment & Plan:  Type 2 DM: well-controlled.  Patient is advised the following: Patient Instructions  check your blood sugar once a Barbone.  vary the time of Jordan when you check, between before the 3 meals, and at bedtime.  also check if you have symptoms of your blood sugar being too high or too low.  please keep a record of the readings and bring it to your next appointment here.  You can write it on any piece of paper.  please call us sooner if your blood sugar goes below 70, or if you have a lot of readings over 200.   I have sent prescriptions to your pharmacy, for alternatives to jardiance and tradjenta. Please come back for a follow-up appointment in 6 months.

## 2016-11-19 NOTE — Patient Instructions (Addendum)
check your blood sugar once a Gleed.  vary the time of Berti when you check, between before the 3 meals, and at bedtime.  also check if you have symptoms of your blood sugar being too high or too low.  please keep a record of the readings and bring it to your next appointment here.  You can write it on any piece of paper.  please call us sooner if your blood sugar goes below 70, or if you have a lot of readings over 200.   I have sent prescriptions to your pharmacy, for alternatives to jardiance and tradjenta. Please come back for a follow-up appointment in 6 months.

## 2016-11-21 ENCOUNTER — Telehealth: Payer: Self-pay | Admitting: Endocrinology

## 2016-11-21 MED ORDER — DAPAGLIFLOZIN PROPANEDIOL 10 MG PO TABS: 10.0000 mg | ORAL_TABLET | Freq: Every day | ORAL | 2 refills | Status: DC

## 2016-11-21 MED ORDER — SITAGLIPTIN PHOSPHATE 100 MG PO TABS: 100.0000 mg | ORAL_TABLET | Freq: Every day | ORAL | 1 refills | Status: DC

## 2016-11-21 NOTE — Telephone Encounter (Signed)
Refills submitted.  

## 2016-11-21 NOTE — Telephone Encounter (Signed)
°  Need prescriptions for  dapagliflozin propanediol (FARXIGA) 10 MG TABS tablet  sitaGLIPtin (JANUVIA) 100 MG tablet  Send to  [facility].Pharm.Passenger transport manager) - [place], [place] [address] [place] (Phone) [phone] (Fax)

## 2016-11-28 MED ORDER — SITAGLIPTIN PHOSPHATE 100 MG PO TABS: 100.0000 mg | ORAL_TABLET | Freq: Every day | ORAL | 1 refills | Status: DC

## 2016-11-28 MED ORDER — DAPAGLIFLOZIN PROPANEDIOL 10 MG PO TABS: 10.0000 mg | ORAL_TABLET | Freq: Every day | ORAL | 2 refills | Status: DC

## 2016-11-28 NOTE — Telephone Encounter (Signed)
Pt needs farxiga and the [place] now need to go to [facility]

## 2016-11-28 NOTE — Telephone Encounter (Signed)
Refills submitted.  

## 2016-12-02 ENCOUNTER — Telehealth: Payer: Self-pay | Admitting: Endocrinology

## 2016-12-02 NOTE — Telephone Encounter (Signed)
Attempted to reach the patient was not available and his mail box was full. PA's have been submitted for [place].

## 2016-12-02 NOTE — Telephone Encounter (Signed)
Pt called in saying that the pharmacy told him that his Jerome Vaughn and [name] are requiring a PA.  He would like to know what the next steps will be.

## 2016-12-03 ENCOUNTER — Ambulatory Visit (INDEPENDENT_AMBULATORY_CARE_PROVIDER_SITE_OTHER): Payer: 59

## 2016-12-03 DIAGNOSIS — Z23 Encounter for immunization: Secondary | ICD-10-CM | POA: Diagnosis not present

## 2016-12-03 NOTE — Telephone Encounter (Signed)
I contacted the patient and advised PA's have been submitted for the [place] and Januvia. Patient voiced understanding and had no further questions.

## 2016-12-05 ENCOUNTER — Other Ambulatory Visit: Payer: Self-pay

## 2016-12-05 ENCOUNTER — Telehealth: Payer: Self-pay

## 2016-12-05 ENCOUNTER — Telehealth: Payer: Self-pay | Admitting: Family Medicine

## 2016-12-05 DIAGNOSIS — I10 Essential (primary) hypertension: Secondary | ICD-10-CM

## 2016-12-05 MED ORDER — SITAGLIPTIN PHOSPHATE 100 MG PO TABS: 100.0000 mg | ORAL_TABLET | Freq: Every day | ORAL | 1 refills | Status: DC

## 2016-12-05 NOTE — Telephone Encounter (Signed)
Refill request for Metoprolol 25 mg and send a 90 [place]qu[place] supply to [facility].

## 2016-12-05 NOTE — Telephone Encounter (Signed)
I contacted the patient and advised rx for Januvia has been approved till 12/06/2017. Per pt's request rx submitted to [facility].

## 2016-12-05 NOTE — Telephone Encounter (Signed)
Pharmacy calling to check the status of the following Rx's  metoprolol and rosuvastatin for the pt the pharmacy is not able to escribe.

## 2016-12-08 MED ORDER — ROSUVASTATIN CALCIUM 40 MG PO TABS: ORAL_TABLET | ORAL | 0 refills | Status: DC

## 2016-12-08 MED ORDER — METOPROLOL TARTRATE 25 MG PO TABS: 37.5000 mg | ORAL_TABLET | Freq: Two times a day (BID) | ORAL | 0 refills | Status: DC

## 2016-12-08 NOTE — Telephone Encounter (Signed)
I sent both scripts e-scribe to [facility].

## 2016-12-23 ENCOUNTER — Telehealth: Payer: Self-pay

## 2016-12-23 NOTE — Telephone Encounter (Signed)
[facility] pharmacy is asking about the refill they received for the fortimet ER  # [phone] [facility] ext [phone]

## 2016-12-23 NOTE — Telephone Encounter (Signed)
I contacted Janett Billow with [facility] and advised to please dispense the Glucophage XR. Janett Billow voiced understanding.

## 2016-12-31 ENCOUNTER — Telehealth: Payer: Self-pay | Admitting: Family Medicine

## 2016-12-31 NOTE — Telephone Encounter (Signed)
Refill request for Viagra 50 mg and send to [facility].

## 2017-01-05 NOTE — Telephone Encounter (Signed)
Call in #10 with 11 rf 

## 2017-01-07 MED ORDER — SILDENAFIL CITRATE 50 MG PO TABS: 50.0000 mg | ORAL_TABLET | Freq: Every day | ORAL | 11 refills | Status: DC | PRN

## 2017-01-07 NOTE — Telephone Encounter (Signed)
Script was printed and faxed to [facility].

## 2017-02-09 ENCOUNTER — Encounter: Payer: Self-pay | Admitting: Family Medicine

## 2017-02-09 ENCOUNTER — Ambulatory Visit (INDEPENDENT_AMBULATORY_CARE_PROVIDER_SITE_OTHER): Payer: 59 | Admitting: Family Medicine

## 2017-02-09 VITALS — BP 113/78 | HR 62 | Temp 97.7°F | Ht 67.0 in | Wt 183.0 lb

## 2017-02-09 DIAGNOSIS — Z23 Encounter for immunization: Secondary | ICD-10-CM

## 2017-02-09 DIAGNOSIS — K649 Unspecified hemorrhoids: Secondary | ICD-10-CM | POA: Diagnosis not present

## 2017-02-09 NOTE — Addendum Note (Signed)
Addended by: Aggie Hacker A on: 02/09/2017 10:50 AM   Modules accepted: Orders

## 2017-02-09 NOTE — Patient Instructions (Signed)
WE NOW OFFER   [facility] FAST TRACK!!!  SAME [facility] Appointments for ACUTE CARE  Such as: Sprains, Injuries, cuts, abrasions, rashes, muscle pain, joint pain, back pain Colds, flu, sore throats, headache, allergies, cough, fever  Ear pain, sinus and eye infections Abdominal pain, nausea, vomiting, diarrhea, upset stomach Animal/insect bites  3 Easy Ways to Schedule: Walk-In Scheduling Call in scheduling Mychart Sign-up: [email or web address]         

## 2017-02-09 NOTE — Progress Notes (Signed)
   Subjective:    Patient ID: Jerome Vaughn, male    DOB: Mar 20, 1951, 66 y.o.   MRN: [id number]  HPI Here for one week of seeing a small amount of bright red blood after he passes a bowel movement. The stools tend to be dry or hard, but they are not difficult to pass and they are not painful. No abdominal pain. He drinks plenty of water every Noland.    Review of Systems  Constitutional: Negative.   Respiratory: Negative.   Cardiovascular: Negative.   Gastrointestinal: Positive for anal bleeding. Negative for abdominal distention, abdominal pain, constipation, diarrhea, nausea, rectal pain and vomiting.       Objective:   Physical Exam  Constitutional: He appears well-developed and well-nourished.  Cardiovascular: Normal rate, regular rhythm, normal heart sounds and intact distal pulses.   Pulmonary/Chest: Effort normal and breath sounds normal.  Abdominal: Soft. Bowel sounds are normal. He exhibits no distension and no mass. There is no tenderness. There is no rebound and no guarding.  Genitourinary:  Genitourinary Comments: Several external hemorrhoids are present           Assessment & Plan:  Hemorrhoids, I suggested he try Miralax or Metamucil daily to soften the stools. Recheck prn.  Alysia Penna, MD

## 2017-02-10 ENCOUNTER — Ambulatory Visit (INDEPENDENT_AMBULATORY_CARE_PROVIDER_SITE_OTHER): Payer: BLUE CROSS/BLUE SHIELD | Admitting: Ophthalmology

## 2017-02-24 ENCOUNTER — Other Ambulatory Visit: Payer: Self-pay | Admitting: Family Medicine

## 2017-02-24 DIAGNOSIS — I10 Essential (primary) hypertension: Secondary | ICD-10-CM

## 2017-03-10 ENCOUNTER — Other Ambulatory Visit: Payer: Self-pay

## 2017-03-10 MED ORDER — SITAGLIPTIN PHOSPHATE 100 MG PO TABS: 100.0000 mg | ORAL_TABLET | Freq: Every day | ORAL | 1 refills | Status: DC

## 2017-03-13 ENCOUNTER — Telehealth: Payer: Self-pay | Admitting: Endocrinology

## 2017-03-13 MED ORDER — DAPAGLIFLOZIN PROPANEDIOL 10 MG PO TABS: 10.0000 mg | ORAL_TABLET | Freq: Every day | ORAL | 2 refills | Status: DC

## 2017-03-13 MED ORDER — SITAGLIPTIN PHOSPHATE 100 MG PO TABS: 100.0000 mg | ORAL_TABLET | Freq: Every day | ORAL | 1 refills | Status: DC

## 2017-03-13 NOTE — Telephone Encounter (Signed)
Pt called and is concerned because his insurance does not want to cover two of his RXs, [name] was one, the other was one I don't think is listed on his medication list [name]?) he asked for you to call him back so he can see if Dr. Loanne Drilling can prescribe him an alternative. He also has questions about his mail order pharmacy, thanks!

## 2017-03-13 NOTE — Telephone Encounter (Signed)
I contacted the patient back and advised we could send a prescription for his Tong[place] and farxiga. Patient advised we we would complete the PA and submit the needed information requesting them to pay for the Bertrand.  Form completed and faxed to the insurance. Waiting on confirmation back stating if the medication will be approved.

## 2017-03-27 ENCOUNTER — Telehealth: Payer: Self-pay | Admitting: Endocrinology

## 2017-03-27 ENCOUNTER — Other Ambulatory Visit: Payer: Self-pay

## 2017-03-27 MED ORDER — EMPAGLIFLOZIN 25 MG PO TABS: 25.0000 mg | ORAL_TABLET | Freq: Every day | ORAL | 11 refills | Status: DC

## 2017-03-27 NOTE — Telephone Encounter (Signed)
Patient called to check the status on the note below. Call patient to advise.

## 2017-03-27 NOTE — Telephone Encounter (Signed)
This was faxed with confirmation on 03/13/17- I am refaxing this to [facility] today and patient has been notified

## 2017-03-27 NOTE — Telephone Encounter (Signed)
please call patient: Ins wants you to change farxiga to jardiance.  I have sent a prescription to your pharmacy I'll see you next time.

## 2017-03-30 MED ORDER — EMPAGLIFLOZIN 25 MG PO TABS
25.0000 mg | ORAL_TABLET | Freq: Every day | ORAL | 2 refills | Status: DC
Start: 2017-03-30 — End: 2017-10-27

## 2017-03-30 NOTE — Telephone Encounter (Signed)
Patient notified

## 2017-04-17 ENCOUNTER — Telehealth: Payer: Self-pay | Admitting: Endocrinology

## 2017-04-17 ENCOUNTER — Telehealth: Payer: Self-pay | Admitting: Family Medicine

## 2017-04-17 NOTE — Telephone Encounter (Signed)
Pt is calling to let md know viagra 50 mg is not working and he would like to go to next highest mg. Pharm [facility]  mail order pharm

## 2017-04-17 NOTE — Telephone Encounter (Signed)
Patient calling to request his meter be changed from Accu Check to the One Touch per pharmacies recommendation.  Pharmacy is:  [facility] [place], [place] [phone] (Phone) [phone] (Fax)

## 2017-04-20 MED ORDER — SILDENAFIL CITRATE 100 MG PO TABS: 100.0000 mg | ORAL_TABLET | Freq: Every day | ORAL | 3 refills | Status: DC | PRN

## 2017-04-20 NOTE — Telephone Encounter (Signed)
Ready to fax  

## 2017-04-20 NOTE — Telephone Encounter (Signed)
Script was printed and faxed to below mail order.

## 2017-04-21 ENCOUNTER — Other Ambulatory Visit: Payer: Self-pay

## 2017-04-21 MED ORDER — ONETOUCH VERIO FLEX SYSTEM W/DEVICE KIT: 1.0000 | PACK | Freq: Every day | 0 refills | Status: DC

## 2017-04-21 MED ORDER — GLUCOSE BLOOD VI STRP: ORAL_STRIP | 12 refills | Status: DC

## 2017-04-21 NOTE — Telephone Encounter (Signed)
Left patient VM about new prescription for monitor sent to is pharmacy.

## 2017-04-21 NOTE — Telephone Encounter (Signed)
Patient calling to check the status on the note below, it has been 4 days and the patient has not heard anything. Please advise today so patient can check his insulin. Patient requesting a call back with an update also.

## 2017-04-21 NOTE — Telephone Encounter (Signed)
OK to change?

## 2017-05-15 ENCOUNTER — Other Ambulatory Visit: Payer: Self-pay | Admitting: Family Medicine

## 2017-05-15 DIAGNOSIS — I10 Essential (primary) hypertension: Secondary | ICD-10-CM

## 2017-05-19 ENCOUNTER — Encounter: Payer: Self-pay | Admitting: Endocrinology

## 2017-05-19 ENCOUNTER — Ambulatory Visit (INDEPENDENT_AMBULATORY_CARE_PROVIDER_SITE_OTHER): Payer: 59 | Admitting: Endocrinology

## 2017-05-19 VITALS — BP 126/84 | HR 69 | Wt 184.2 lb

## 2017-05-19 DIAGNOSIS — I2510 Atherosclerotic heart disease of native coronary artery without angina pectoris: Secondary | ICD-10-CM

## 2017-05-19 DIAGNOSIS — E1142 Type 2 diabetes mellitus with diabetic polyneuropathy: Secondary | ICD-10-CM

## 2017-05-19 LAB — POCT GLYCOSYLATED HEMOGLOBIN (HGB A1C): HEMOGLOBIN A1C: 6.6

## 2017-05-19 NOTE — Patient Instructions (Addendum)
check your blood sugar once a Gundry.  vary the time of Zellner when you check, between before the 3 meals, and at bedtime.  also check if you have symptoms of your blood sugar being too high or too low.  please keep a record of the readings and bring it to your next appointment here.  You can write it on any piece of paper.  please call us sooner if your blood sugar goes below 70, or if you have a lot of readings over 200.   Please go back to see the heart specialist.  you will receive a phone call, about a Gover and time for an appointment Please come back for a follow-up appointment in 6 months.

## 2017-05-19 NOTE — Progress Notes (Signed)
Subjective:    Patient ID: Jerome Vaughn, male    DOB: 09/07/1951, 66 y.o.   MRN: [id number]  HPI Pt returns for f/u of diabetes mellitus:  DM type: 2 Dx'ed: 7026 Complications: CAD.   Therapy: 4 oral meds.  DKA: never Severe hypoglycemia: never.  Pancreatitis: never.  Other: he has never been on insulin, but he has learned how; he stopped pioglitizone, due to edema. Interval history: pt states he feels well in general.  Past Medical History:  Diagnosis Date  . Carpal tunnel syndrome    both hands  . Coronary artery disease    a. Cath 11/21/2015: Multivessel CAD --> CABG recommended. b. CABG on 11/22/2015:  LIMA-LAD, SVG-Diag, SVG-OM1 and distal Cx, and SVG-PDA.  . Diabetes mellitus   . History of echocardiogram    a. Echo 4/17: Moderate LVH, EF 50-55%, mild LAE, no pericardial effusion  . Hyperlipidemia   . Hypertension     Past Surgical History:  Procedure Laterality Date  . CARDIAC CATHETERIZATION N/A 11/21/2015   Procedure: Left Heart Cath and Coronary Angiography;  Surgeon: [name], MD;  Location: [facility];  Service: Cardiovascular;  Laterality: N/A;  . colonsocopy  12/07/2009   per Dr. [name], benign polyps, repeat in 10 yrs   . CORONARY ARTERY BYPASS GRAFT N/A 11/22/2015   Procedure: CORONARY ARTERY BYPASS GRAFTING (CABG) times five using the left internal mammary, right greater saphenous vein EVH, and left thigh greater saphenous vein EVH;  Surgeon: [name], MD;  Location: [facility];  Service: Open Heart Surgery;  Laterality: N/A;  . frozen shoulder release    . TEE WITHOUT CARDIOVERSION N/A 11/22/2015   Procedure: TRANSESOPHAGEAL ECHOCARDIOGRAM (TEE);  Surgeon: [name], MD;  Location: [facility];  Service: Open Heart Surgery;  Laterality: N/A;  . WRIST GANGLION EXCISION      Social History   Social History  . Marital status: Married    Spouse name: N/A  . Number of children: 5  . Years of education: N/A   Occupational History  .  Dispatcher    Social History Main Topics  . Smoking status: Former Smoker    Packs/Bouchie: 0.25    Years: 20.00    Types: Cigarettes    Quit date: 10/07/2013  . Smokeless tobacco: Never Used     Comment: quit cigarettes, might have a cigar 1 X month  . Alcohol use 1.8 oz/week    3 Standard drinks or equivalent per week  . Drug use: No  . Sexual activity: Not on file   Other Topics Concern  . 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 aspirin EC 325 MG EC tablet Take 1 tablet (325 mg total) by mouth daily.    . bromocriptine (PARLODEL) 2.5 MG tablet TAKE 0.5 TABLETS (1.25 MG TOTAL) BY MOUTH AT BEDTIME 45 tablet 2  . CINNAMON PO Take 2,000 mg by mouth daily.     . cyclobenzaprine (FLEXERIL) 10 MG tablet Take 1 tablet (10 mg total) by mouth 3 (three) times daily as needed for muscle spasms. 60 tablet 2  . empagliflozin (JARDIANCE) 25 MG TABS tablet Take 25 mg by mouth daily. 90 tablet 2  .  glucose blood (ONETOUCH VERIO) test strip Use as instructed to check BG once a Gundry 100 each 12  . Lancets (ACCU-CHEK SOFT TOUCH) lancets Dispense for Aviva plus and diagnosis code is E 11.9 100 each 0  . metFORMIN (GLUCOPHAGE-XR) 500 MG 24 hr tablet TAKE 4 TABLETS DAILY WITH BREAKFAST 360 tablet 2  . metoprolol tartrate (LOPRESSOR) 25 MG tablet TAKE ONE AND ONE-HALF TABLETS BY MOUTH TWICE DAILY 270 tablet 3  . Multiple Vitamin (MULTIVITAMIN) tablet Take 1 tablet by mouth daily.      . Omega-3 Fatty Acids (FISH OIL) 1000 MG CAPS Take 1,000 mg by mouth daily.     . rosuvastatin (CRESTOR) 40 MG tablet TAKE ONE TABLET BY MOUTH DAILY AS DIRECTED FOR CHOLESTEROL 90 tablet 3  . sildenafil (VIAGRA) 100 MG tablet Take 1 tablet (100 mg total) by mouth daily as needed for erectile  dysfunction. 30 tablet 3  . sitaGLIPtin (JANUVIA) 100 MG tablet Take 1 tablet (100 mg total) by mouth daily. 90 tablet 1   No current facility-administered medications on file prior to visit.     No Known Allergies  Family History  Problem Relation Age of Onset  . Anuerysm Brother   . Hyperlipidemia Sister   . Diabetes Sister   . Hypertension Sister   . Diabetes Maternal Grandmother   . Hyperlipidemia Sister   . Kidney disease Sister   . Heart attack Neg Hx     BP 126/84   Pulse 69   Wt 184 lb 3.2 oz (83.6 kg)   SpO2 95%   BMI 28.85 kg/m   Review of Systems He denies hypoglycemia.  Denies chest pain and sob    Objective:   Physical Exam VITAL SIGNS:  See vs page GENERAL: no distress Pulses: foot pulses are intact bilaterally.   MSK: no deformity of the feet or ankles.  CV: trace bilat edema of the legs.   Skin:  no ulcer on the feet or ankles.  normal color and temp on the feet and ankles.   Neuro: sensation is intact to touch on the feet and ankles.    A1c=6.6% Lab Results  Component Value Date   CREATININE 1.15 05/27/2016   BUN 17 05/27/2016   NA 137 05/27/2016   K 4.6 05/27/2016   CL 103 05/27/2016   CO2 27 05/27/2016       Assessment & Plan:  Type 2 DM: well-controlled. Edema, recurrent.  This limits rx options.  CAD: due for f/u   Patient is advised the following: Patient Instructions  check your blood sugar once a [place].  vary the time of Rease when you check, between before the 3 meals, and at bedtime.  also check if you have symptoms of your blood sugar being too high or too low.  please keep a record of the readings and bring it to your next appointment here.  You can write it on any piece of paper.  please call us sooner if your blood sugar goes below 70, or if you have a lot of readings over 200.   Please go back to see the heart specialist.  you will receive a phone call, about a Tiegs and time for an appointment Please come back for a follow-up  appointment in 6 months.

## 2017-05-19 NOTE — Telephone Encounter (Signed)
Can you confirm dose change on Metoprolol?

## 2017-05-20 ENCOUNTER — Other Ambulatory Visit: Payer: Self-pay | Admitting: Endocrinology

## 2017-06-18 ENCOUNTER — Encounter: Payer: Self-pay | Admitting: Family Medicine

## 2017-06-19 ENCOUNTER — Other Ambulatory Visit: Payer: Self-pay | Admitting: Endocrinology

## 2017-06-22 ENCOUNTER — Other Ambulatory Visit: Payer: Self-pay | Admitting: Endocrinology

## 2017-06-22 ENCOUNTER — Ambulatory Visit: Payer: 59 | Admitting: Nurse Practitioner

## 2017-06-24 ENCOUNTER — Encounter: Payer: Self-pay | Admitting: Nurse Practitioner

## 2017-06-24 ENCOUNTER — Ambulatory Visit (INDEPENDENT_AMBULATORY_CARE_PROVIDER_SITE_OTHER): Payer: 59 | Admitting: Nurse Practitioner

## 2017-06-24 VITALS — BP 100/68 | HR 70 | Ht 67.5 in | Wt 186.1 lb

## 2017-06-24 DIAGNOSIS — I2510 Atherosclerotic heart disease of native coronary artery without angina pectoris: Secondary | ICD-10-CM | POA: Diagnosis not present

## 2017-06-24 DIAGNOSIS — E7800 Pure hypercholesterolemia, unspecified: Secondary | ICD-10-CM

## 2017-06-24 DIAGNOSIS — I10 Essential (primary) hypertension: Secondary | ICD-10-CM

## 2017-06-24 DIAGNOSIS — Z951 Presence of aortocoronary bypass graft: Secondary | ICD-10-CM | POA: Diagnosis not present

## 2017-06-24 MED ORDER — ASPIRIN EC 81 MG PO TBEC: 81.0000 mg | DELAYED_RELEASE_TABLET | Freq: Every day | ORAL | Status: AC

## 2017-06-24 NOTE — Progress Notes (Signed)
CARDIOLOGY OFFICE NOTE  Date:  06/24/2017    [name] Date of Birth: Oct 30, 1950 Medical Record #[id number]  PCP:  [name], MD  Cardiologist:   [name]  Chief Complaint  Patient presents with  . Coronary Artery Disease    Follow up visit - seen for Dr. [name]    History of Present Illness: Jerome Vaughn is a 66 y.o. male who presents today for a follow up visit. Seen for Dr. [name].   He has a history of smoking, DM, HTN, & HLD.  Patient was referred to our office in February 2017 for exercise treadmill test for exertional chest discomfort.  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 He was referred to TCTS. He was seen by Dr. Prescott Gum and underwent CABG with LIMA-LAD, SVG-diagonal, SVG-OM1/distal LCx, SVG-PDA on 11/22/15. Postoperative course was fairly uneventful. He was discharged 11/27/15.  He was readmitted in March of 2017. Cardiac markers remained normal. CT was negative for pulmonary embolism but did show small to moderate pericardial effusion. Echocardiogram confirmed pericardial effusion without evidence of tamponade.  Seen by Richardson Dopp, PA back in March 2017 - was doing well - had echo updated the following month - no evidence of effusion.   Comes in today. Here alone. Not really clear to me as to why he did not keep his follow up. He says he is doing ok. No real problems but he has "some concerns". He will have some tightness over the left chest - feels like "skin tightening". Has been present for quite some time. Comes and goes. Not exertional. Will just go away on its on. He feels this is similar to his prior chest pain syndrome but also with some palpation of his chest. Not exercising. Has a "physical" job but admits he could do more exercise. No recent lipids. Not short of breath. He is actually taking 2 full strength aspirins each [place] - says he has done this "forever". Most recent A1C under 7.   Past Medical History:  Diagnosis Date  . Carpal tunnel  syndrome    both hands  . Coronary artery disease    a. Cath 11/21/2015: Multivessel CAD --> CABG recommended. b. CABG on 11/22/2015:  LIMA-LAD, SVG-Diag, SVG-OM1 and distal Cx, and SVG-PDA.  . Diabetes mellitus   . History of echocardiogram    a. Echo 4/17: Moderate LVH, EF 50-55%, mild LAE, no pericardial effusion  . Hyperlipidemia   . Hypertension     Past Surgical History:  Procedure Laterality Date  . CARDIAC CATHETERIZATION N/A 11/21/2015   Procedure: Left Heart Cath and Coronary Angiography;  Surgeon: [name], MD;  Location: [facility];  Service: Cardiovascular;  Laterality: N/A;  . colonsocopy  12/07/2009   per Dr. [name], benign polyps, repeat in 10 yrs   . CORONARY ARTERY BYPASS GRAFT N/A 11/22/2015   Procedure: CORONARY ARTERY BYPASS GRAFTING (CABG) times five using the left internal mammary, right greater saphenous vein EVH, and left thigh greater saphenous vein EVH;  Surgeon: [name], MD;  Location: [facility];  Service: Open Heart Surgery;  Laterality: N/A;  . frozen shoulder release    . TEE WITHOUT CARDIOVERSION N/A 11/22/2015   Procedure: TRANSESOPHAGEAL ECHOCARDIOGRAM (TEE);  Surgeon: [name], MD;  Location: [facility];  Service: Open Heart Surgery;  Laterality: N/A;  . WRIST GANGLION EXCISION       Medications: Current Meds  Medication Sig  . Ascorbic Acid (VITAMIN  C) 100 MG tablet Take 100 mg by mouth daily.  . Blood Glucose Monitoring Suppl ([place]) w/Device KIT USE AS DIRECTED  . Blood Glucose Monitoring Suppl ([place] FLEX SYSTEM) w/Device KIT USE AS DIRECTED  . bromocriptine (PARLODEL) 2.5 MG tablet TAKE 0.5 TABLETS (1.25 MG TOTAL) BY MOUTH AT BEDTIME  . CINNAMON PO Take 2,000 mg by mouth daily.   . empagliflozin (JARDIANCE) 25 MG TABS tablet Take 25 mg by mouth daily.  Marland Kitchen glucose blood (ONETOUCH VERIO) test strip Use as instructed to check BG once a [place]  . Lancets (ACCU-CHEK SOFT TOUCH) lancets Dispense for Aviva  plus and diagnosis code is E 11.9  . metFORMIN (GLUCOPHAGE-XR) 500 MG 24 hr tablet TAKE 4 TABLETS DAILY WITH BREAKFAST  . metoprolol tartrate (LOPRESSOR) 25 MG tablet TAKE ONE AND ONE-HALF TABLETS BY MOUTH TWICE DAILY  . Multiple Vitamin (MULTIVITAMIN) tablet Take 1 tablet by mouth daily.    . Omega-3 Fatty Acids (FISH OIL) 1000 MG CAPS Take 1,000 mg by mouth daily.   . rosuvastatin (CRESTOR) 40 MG tablet TAKE ONE TABLET BY MOUTH DAILY AS DIRECTED FOR CHOLESTEROL  . sildenafil (VIAGRA) 100 MG tablet Take 1 tablet (100 mg total) by mouth daily as needed for erectile dysfunction.  . sitaGLIPtin (JANUVIA) 100 MG tablet Take 1 tablet (100 mg total) by mouth daily.  . [DISCONTINUED] aspirin EC 325 MG EC tablet Take 1 tablet (325 mg total) by mouth daily.     Allergies: No Known Allergies  Social History: The patient  reports that he quit smoking about 3 years ago. His smoking use included Cigarettes. He has a 5.00 pack-year smoking history. He has never used smokeless tobacco. He reports that he drinks about 1.8 oz of alcohol per week . He reports that he does not use drugs.   Family History: The patient's family history includes Anuerysm in his brother; Diabetes in his maternal grandmother and sister; Hyperlipidemia in his sister and sister; Hypertension in his sister; Kidney disease in his sister.   Review of Systems: Please see the history of present illness.   Otherwise, the review of systems is positive for none.   All other systems are reviewed and negative.   Physical Exam: VS:  BP 100/68 (BP Location: Left Arm, Patient Position: Sitting, Cuff Size: Normal)   Pulse 70   Ht 5' 7.5" (1.715 m)   Wt 186 lb 1.9 oz (84.4 kg)   BMI 28.72 kg/m  .  BMI Body mass index is 28.72 kg/m.  Wt Readings from Last 3 Encounters:  06/24/17 186 lb 1.9 oz (84.4 kg)  05/19/17 184 lb 3.2 oz (83.6 kg)  02/09/17 183 lb (83 kg)    General: Pleasant. Well developed, well nourished and in no acute  distress.   HEENT: Normal but with poor dentition.  Neck: Supple, no JVD, carotid bruits, or masses noted.  Cardiac: Regular rate and rhythm. No murmurs, rubs, or gallops. No edema.  Respiratory:  Lungs are clear to auscultation bilaterally with normal work of breathing.  GI: Soft and nontender.  MS: No deformity or atrophy. Gait and ROM intact.  Skin: Warm and dry. Color is normal.  Neuro:  Strength and sensation are intact and no gross focal deficits noted.  Psych: Alert, appropriate and with normal affect.   LABORATORY DATA:  EKG:  EKG is ordered today. This demonstrates NSR with IVCD.  Lab Results  Component Value Date   WBC 4.3 05/27/2016   HGB 13.9  05/27/2016   HCT 41.2 05/27/2016   PLT 140.0 (L) 05/27/2016   GLUCOSE 140 (H) 05/27/2016   CHOL 207 (H) 05/27/2016   TRIG 120.0 05/27/2016   HDL 55.20 05/27/2016   LDLDIRECT 204.8 09/05/2013   LDLCALC 127 (H) 05/27/2016   ALT 19 05/27/2016   AST 19 05/27/2016   NA 137 05/27/2016   K 4.6 05/27/2016   CL 103 05/27/2016   CREATININE 1.15 05/27/2016   BUN 17 05/27/2016   CO2 27 05/27/2016   TSH 0.842 02/12/2016   PSA 0.34 05/27/2016   INR 1.30 12/06/2015   HGBA1C 6.6 05/19/2017   MICROALBUR 1.2 08/22/2014     BNP (last 3 results) No results for input(s): BNP in the last 8760 hours.  ProBNP (last 3 results) No results for input(s): PROBNP in the last 8760 hours.   Other Studies Reviewed Today:  Echo Study Conclusions 12/2015  - Left ventricle: The cavity size was mildly dilated. Wall   thickness was increased in a pattern of moderate LVH. Systolic   function was normal. The estimated ejection fraction was in the   range of 50% to 55%. - Left atrium: The atrium was mildly dilated. - Impressions: Limited study no color flow or doppler done  Impressions:  - Limited study no color flow or doppler done   Echo 12/06/15  Moderate concentric LVH, EF 60-65%, normal wall motion, grade 2 diastolic dysfunction,  normal RV function, mild TR, small to moderate pericardial effusion, no tamponade physiology, left pleural effusion  Chest CTA 12/05/15 Negative for pulmonary embolism, small to moderate pericardial effusion, dependent bilateral atelectasis, cardiomegaly  LHC 11/21/15 LM 70%, 90% LAD proximal 85%, D1 1 100% LCx OM1 75%, OM to 65% RCA mid 100%  Severe multivessel coronary disease with chronic total occlusion of the mid RCA, distal RCA collateralized from the left circumflex, 70% distal left main, segmental 90% ostial to proximal LAD with superimposed saccular aneurysm, 80% mid LAD, and 70% tandem stenoses in the first obtuse marginal. A branch of the first diagonal is totally occluded  Mild mid anterior wall hypokinesis. EF is estimated to be 55%. EDP was normal.  ETT 11/14/15 2 mm or greater ST depression in inferolateral leads at peak exercise  Echo 6/10 Mild LVH, EF 55%  Assessment/Plan:  1. CAD - s/p CABG 10/2015.  Reports recurrent chest tightness - typical and atypical features noted - will arrange for stress Myoview. Further disposition to follow.   2. Pericardial effusion - resolved on repeat echo - no rub on exam.   3. HTN - BP looks ok on his current regimen.   4. HLD - on statin - no recent lipids - will arrange at time of stress testing.   Current medicines are reviewed with the patient today.  The patient does not have concerns regarding medicines other than what has been noted above.  The following changes have been made:  See above.  Labs/ tests ordered today include:    Orders Placed This Encounter  Procedures  . Basic metabolic panel  . CBC  . Hepatic function panel  . Lipid panel  . MYOCARDIAL PERFUSION IMAGING  . EKG 12-Lead     Disposition:   FU with Dr. [name] tentatively in 6 months unless stress testing is abnormal.    Patient is agreeable to this plan and will call if any problems develop in the interim.   SignedTruitt Merle, NP    06/24/2017 11:34 AM  [facility]  [address] [place], [place]  [address] Phone: [phone] Fax: [phone]

## 2017-06-24 NOTE — Patient Instructions (Addendum)
We will be checking the following labs today - NONE  Fasting lipids/HPF/BMET/CBC on [place] of stress test.    Medication Instructions:    Continue with your current medicines. BUT  Cut your aspirin back to just 81 mg a [place]    Testing/Procedures To Be Arranged:  Stress Myoview  Follow-Up:   Tentatively see Dr. [name] in 6 months.     Other Special Instructions:   N/A    If you need a refill on your cardiac medications before your next appointment, please call your pharmacy.   Call the [facility] office at [phone] if you have any questions, problems or concerns.

## 2017-07-06 ENCOUNTER — Telehealth (HOSPITAL_COMMUNITY): Payer: Self-pay | Admitting: *Deleted

## 2017-07-06 NOTE — Telephone Encounter (Signed)
Patient given detailed instructions per Myocardial Perfusion Study Information Sheet for the test on 07/08/17 at 0715. Patient notified to arrive 15 minutes early and that it is imperative to arrive on time for appointment to keep from having the test rescheduled.  If you need to cancel or reschedule your appointment, please call the office within 24 hours of your appointment. . Patient verbalized understanding.Kallyn Demarcus, Ranae Palms

## 2017-07-08 ENCOUNTER — Ambulatory Visit (HOSPITAL_COMMUNITY): Payer: 59 | Attending: Cardiology

## 2017-07-08 ENCOUNTER — Other Ambulatory Visit: Payer: 59 | Admitting: *Deleted

## 2017-07-08 DIAGNOSIS — R002 Palpitations: Secondary | ICD-10-CM | POA: Insufficient documentation

## 2017-07-08 DIAGNOSIS — I2510 Atherosclerotic heart disease of native coronary artery without angina pectoris: Secondary | ICD-10-CM

## 2017-07-08 DIAGNOSIS — E7800 Pure hypercholesterolemia, unspecified: Secondary | ICD-10-CM

## 2017-07-08 DIAGNOSIS — R079 Chest pain, unspecified: Secondary | ICD-10-CM | POA: Diagnosis present

## 2017-07-08 DIAGNOSIS — Z951 Presence of aortocoronary bypass graft: Secondary | ICD-10-CM | POA: Diagnosis not present

## 2017-07-08 DIAGNOSIS — I10 Essential (primary) hypertension: Secondary | ICD-10-CM | POA: Insufficient documentation

## 2017-07-08 LAB — MYOCARDIAL PERFUSION IMAGING
Estimated workload: 10.4 METS
Exercise duration (min): 6 min
Exercise duration (sec): 30 s
LV dias vol: 141 mL (ref 62–150)
LV sys vol: 74 mL
MPHR: 157 {beats}/min
Peak HR: 146 {beats}/min
Percent HR: 93 %
RATE: 0.28
RPE: 18
Rest HR: 60 {beats}/min
SDS: 0
SRS: 0
SSS: 0
TID: 1.12

## 2017-07-08 MED ORDER — TECHNETIUM TC 99M TETROFOSMIN IV KIT
10.8000 | PACK | Freq: Once | INTRAVENOUS | Status: AC | PRN
  Administered 2017-07-08: 10.8 via INTRAVENOUS
  Filled 2017-07-08: qty 11

## 2017-07-08 MED ORDER — TECHNETIUM TC 99M TETROFOSMIN IV KIT
32.6000 | PACK | Freq: Once | INTRAVENOUS | Status: AC | PRN
  Administered 2017-07-08: 32.6 via INTRAVENOUS
  Filled 2017-07-08: qty 33

## 2017-07-09 LAB — LIPID PANEL
Chol/HDL Ratio: 3.1 ratio (ref 0.0–5.0)
Cholesterol, Total: 169 mg/dL (ref 100–199)
HDL: 54 mg/dL (ref >39)
LDL Calculated: 89 mg/dL (ref 0–99)
Triglycerides: 129 mg/dL (ref 0–149)
VLDL Cholesterol Cal: 26 mg/dL (ref 5–40)

## 2017-07-09 LAB — BASIC METABOLIC PANEL
BUN/Creatinine Ratio: 14 (ref 10–24)
BUN: 15 mg/dL (ref 8–27)
CO2: 20 mmol/L (ref 20–29)
Calcium: 9.3 mg/dL (ref 8.6–10.2)
Chloride: 103 mmol/L (ref 96–106)
Creatinine, Ser: 1.04 mg/dL (ref 0.76–1.27)
GFR calc Af Amer: 88 mL/min/{1.73_m2} (ref >59)
GFR calc non Af Amer: 76 mL/min/{1.73_m2} (ref >59)
Glucose: 121 mg/dL — ABNORMAL HIGH (ref 65–99)
Potassium: 3.9 mmol/L (ref 3.5–5.2)
Sodium: 137 mmol/L (ref 134–144)

## 2017-07-09 LAB — HEPATIC FUNCTION PANEL
ALT: 23 IU/L (ref 0–44)
AST: 22 IU/L (ref 0–40)
Albumin: 4.3 g/dL (ref 3.6–4.8)
Alkaline Phosphatase: 34 IU/L — ABNORMAL LOW (ref 39–117)
Bilirubin Total: 0.4 mg/dL (ref 0.0–1.2)
Bilirubin, Direct: 0.11 mg/dL (ref 0.00–0.40)
Total Protein: 7.5 g/dL (ref 6.0–8.5)

## 2017-07-09 LAB — CBC
Hematocrit: 40.3 % (ref 37.5–51.0)
Hemoglobin: 13.4 g/dL (ref 13.0–17.7)
MCH: 28.3 pg (ref 26.6–33.0)
MCHC: 33.3 g/dL (ref 31.5–35.7)
MCV: 85 fL (ref 79–97)
Platelets: 132 10*3/uL — ABNORMAL LOW (ref 150–379)
RBC: 4.73 x10E6/uL (ref 4.14–5.80)
RDW: 16.1 % — ABNORMAL HIGH (ref 12.3–15.4)
WBC: 3.8 10*3/uL (ref 3.4–10.8)

## 2017-07-14 ENCOUNTER — Other Ambulatory Visit: Payer: Self-pay | Admitting: *Deleted

## 2017-07-14 DIAGNOSIS — R072 Precordial pain: Secondary | ICD-10-CM

## 2017-07-20 ENCOUNTER — Other Ambulatory Visit: Payer: Self-pay

## 2017-07-20 ENCOUNTER — Ambulatory Visit (HOSPITAL_COMMUNITY): Payer: 59 | Attending: Cardiology

## 2017-07-20 DIAGNOSIS — Z720 Tobacco use: Secondary | ICD-10-CM | POA: Diagnosis not present

## 2017-07-20 DIAGNOSIS — I2510 Atherosclerotic heart disease of native coronary artery without angina pectoris: Secondary | ICD-10-CM | POA: Insufficient documentation

## 2017-07-20 DIAGNOSIS — R072 Precordial pain: Secondary | ICD-10-CM | POA: Diagnosis not present

## 2017-07-20 DIAGNOSIS — E785 Hyperlipidemia, unspecified: Secondary | ICD-10-CM | POA: Insufficient documentation

## 2017-07-20 DIAGNOSIS — I071 Rheumatic tricuspid insufficiency: Secondary | ICD-10-CM | POA: Insufficient documentation

## 2017-07-20 DIAGNOSIS — E119 Type 2 diabetes mellitus without complications: Secondary | ICD-10-CM | POA: Diagnosis not present

## 2017-07-20 DIAGNOSIS — I10 Essential (primary) hypertension: Secondary | ICD-10-CM | POA: Diagnosis not present

## 2017-07-24 ENCOUNTER — Ambulatory Visit: Payer: 59 | Admitting: Interventional Cardiology

## 2017-07-29 ENCOUNTER — Ambulatory Visit (INDEPENDENT_AMBULATORY_CARE_PROVIDER_SITE_OTHER): Payer: 59 | Admitting: Interventional Cardiology

## 2017-07-29 ENCOUNTER — Encounter: Payer: Self-pay | Admitting: Interventional Cardiology

## 2017-07-29 VITALS — BP 124/82 | HR 71 | Ht 67.0 in | Wt 189.6 lb

## 2017-07-29 DIAGNOSIS — I10 Essential (primary) hypertension: Secondary | ICD-10-CM | POA: Diagnosis not present

## 2017-07-29 DIAGNOSIS — I2510 Atherosclerotic heart disease of native coronary artery without angina pectoris: Secondary | ICD-10-CM | POA: Diagnosis not present

## 2017-07-29 DIAGNOSIS — I5022 Chronic systolic (congestive) heart failure: Secondary | ICD-10-CM | POA: Diagnosis not present

## 2017-07-29 DIAGNOSIS — I3139 Other pericardial effusion (noninflammatory): Secondary | ICD-10-CM

## 2017-07-29 DIAGNOSIS — I5042 Chronic combined systolic (congestive) and diastolic (congestive) heart failure: Secondary | ICD-10-CM | POA: Insufficient documentation

## 2017-07-29 DIAGNOSIS — I313 Pericardial effusion (noninflammatory): Secondary | ICD-10-CM

## 2017-07-29 MED ORDER — NITROGLYCERIN 0.4 MG SL SUBL: 0.4000 mg | SUBLINGUAL_TABLET | SUBLINGUAL | 1 refills | Status: DC | PRN

## 2017-07-29 MED ORDER — BENAZEPRIL HCL 10 MG PO TABS: 10.0000 mg | ORAL_TABLET | Freq: Every day | ORAL | 3 refills | Status: DC

## 2017-07-29 MED ORDER — NITROGLYCERIN 0.4 MG SL SUBL: 0.4000 mg | SUBLINGUAL_TABLET | SUBLINGUAL | 3 refills | Status: DC | PRN

## 2017-07-29 MED ORDER — BENAZEPRIL HCL 10 MG PO TABS: 10.0000 mg | ORAL_TABLET | Freq: Every day | ORAL | 1 refills | Status: DC

## 2017-07-29 NOTE — Progress Notes (Signed)
Cardiology Office Note    Date:  07/29/2017   ID:  AYYUB KRALL, DOB 11-20-1950, MRN [id number]  PCP:  [name], MD  Cardiologist: Sinclair Grooms, MD   Chief Complaint  Patient presents with  . Coronary Artery Disease  . Congestive Heart Failure    History of Present Illness:  Jerome Vaughn is a 66 y.o. 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 He was referred to TCTS. He was seen by Dr. Prescott Gum and underwent CABG with LIMA-LAD, SVG-diagonal, SVG-OM1/distal LCx, SVG-PDA on 11/22/15.  After bypass surgery he was readmitted to the hospital after developing chest discomfort.  Recently an LV assessment by echo demonstrated EF 30-35%.  The patient feels well but does describe a vague intermittent discomfort in the chest.  These qualities is somewhat similar to previous surgery.  The discomfort is also similar to that that he had 1 week after surgery and led to readmission with rule out.  The discomfort is not particularly exertional.  Earlier this fall and echocardiogram was done to follow-up on prior pericardial effusion.  Surprisingly the EF was low at 35%.  A myocardial perfusion study demonstrated a EF 47% no ischemia and was classified as a low risk study.  After bypass surgery, benazepril therapy was discontinued.    Past Medical History:  Diagnosis Date  . Carpal tunnel syndrome    both hands  . Coronary artery disease    a. Cath 11/21/2015: Multivessel CAD --> CABG recommended. b. CABG on 11/22/2015:  LIMA-LAD, SVG-Diag, SVG-OM1 and distal Cx, and SVG-PDA.  . Diabetes mellitus   . History of echocardiogram    a. Echo 4/17: Moderate LVH, EF 50-55%, mild LAE, no pericardial effusion  . Hyperlipidemia   . Hypertension     Past Surgical History:  Procedure Laterality Date  . CARDIAC CATHETERIZATION N/A 11/21/2015   Procedure: Left Heart Cath and Coronary Angiography;  Surgeon: [name], MD;  Location: [facility];  Service: Cardiovascular;   Laterality: N/A;  . colonsocopy  12/07/2009   per Dr. [name], benign polyps, repeat in 10 yrs   . CORONARY ARTERY BYPASS GRAFT N/A 11/22/2015   Procedure: CORONARY ARTERY BYPASS GRAFTING (CABG) times five using the left internal mammary, right greater saphenous vein EVH, and left thigh greater saphenous vein EVH;  Surgeon: [name], MD;  Location: [facility];  Service: Open Heart Surgery;  Laterality: N/A;  . frozen shoulder release    . TEE WITHOUT CARDIOVERSION N/A 11/22/2015   Procedure: TRANSESOPHAGEAL ECHOCARDIOGRAM (TEE);  Surgeon: [name], MD;  Location: [facility];  Service: Open Heart Surgery;  Laterality: N/A;  . WRIST GANGLION EXCISION      Current Medications: Outpatient Medications Prior to Visit  Medication Sig Dispense Refill  . Ascorbic Acid (VITAMIN C) 100 MG tablet Take 100 mg by mouth daily.    Marland Kitchen aspirin EC 81 MG tablet Take 1 tablet (81 mg total) by mouth daily.    . Blood Glucose Monitoring Suppl (ONETOUCH VERIO FLEX SYSTEM) w/Device KIT USE AS DIRECTED 1 kit 0  . Blood Glucose Monitoring Suppl (ONETOUCH VERIO FLEX SYSTEM) w/Device KIT USE AS DIRECTED 1 kit 0  . bromocriptine (PARLODEL) 2.5 MG tablet TAKE 0.5 TABLETS (1.25 MG TOTAL) BY MOUTH AT BEDTIME 45 tablet 2  . CINNAMON PO Take 2,000 mg by mouth daily.     . empagliflozin (JARDIANCE) 25 MG TABS tablet Take 25 mg by mouth daily. [place]  tablet 2  . glucose blood (ONETOUCH VERIO) test strip Use as instructed to check BG once a [place] 100 each 12  . Lancets (ACCU-CHEK SOFT TOUCH) lancets Dispense for Aviva plus and diagnosis code is E 11.9 100 each 0  . metFORMIN (GLUCOPHAGE-XR) 500 MG 24 hr tablet TAKE 4 TABLETS DAILY WITH BREAKFAST 360 tablet 2  . metoprolol tartrate (LOPRESSOR) 25 MG tablet TAKE ONE AND ONE-HALF TABLETS BY MOUTH TWICE DAILY 270 tablet 3  . Multiple Vitamin (MULTIVITAMIN) tablet Take 1 tablet by mouth daily.      . Omega-3 Fatty Acids (FISH OIL) 1000 MG CAPS Take 1,000 mg by mouth daily.     .  rosuvastatin (CRESTOR) 40 MG tablet TAKE ONE TABLET BY MOUTH DAILY AS DIRECTED FOR CHOLESTEROL 90 tablet 3  . sildenafil (VIAGRA) 100 MG tablet Take 1 tablet (100 mg total) by mouth daily as needed for erectile dysfunction. 30 tablet 3  . sitaGLIPtin (JANUVIA) 100 MG tablet Take 1 tablet (100 mg total) by mouth daily. 90 tablet 1   No facility-administered medications prior to visit.      Allergies:   Patient has no known allergies.   Social History   Social History  . Marital status: Married    Spouse name: N/A  . Number of children: 5  . Years of education: N/A   Occupational History  . Dispatcher    Social History Main Topics  . Smoking status: Former Smoker    Packs/Flud: 0.25    Years: 20.00    Types: Cigarettes    Quit date: 10/07/2013  . Smokeless tobacco: Never Used     Comment: quit cigarettes, might have a cigar 1 X month  . Alcohol use 1.8 oz/week    3 Standard drinks or equivalent per week  . Drug use: No  . Sexual activity: Not Asked   Other Topics Concern  . 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   ROS:   Please see the history of present illness.    Right lower extremity swelling since vein graft harvesting.  Chest discomfort as mentioned above. All other systems reviewed and are negative.   PHYSICAL EXAM:   VS:  BP 124/82 (BP Location: Left Arm)   Pulse 71   Ht '5\' 7"'  (1.702 m)   Wt 189 lb 9.6 oz (86 kg)   BMI 29.70 kg/m    GEN: Well nourished, well developed, in no acute distress. HEENT: normal  Neck: no JVD, carotid bruits, or masses Cardiac: RRR; no murmurs, rubs, or gallops,.   Pitting edema right lower extremity below the knee. Respiratory:  clear to auscultation bilaterally, normal work of breathing GI: soft, nontender, nondistended, + BS MS: no deformity or atrophy  Skin: warm and dry, no rash Neuro:  Alert and Oriented x 3, Strength and sensation are intact Psych: euthymic mood, full affect  Wt Readings from Last 3 Encounters:  07/29/17 189 lb 9.6 oz (86 kg)  07/08/17 186 lb (84.4 kg)  06/24/17 186 lb 1.9 oz (84.4 kg)      Studies/Labs Reviewed:   EKG:  EKG  not repeated  Recent Labs: 07/08/2017: ALT 23; BUN 15; Creatinine, Ser 1.04; Hemoglobin 13.4; Platelets 132; Potassium 3.9; Sodium 137   Lipid Panel    Component Value Date/Time   CHOL 169 07/08/2017 0731   TRIG 129 07/08/2017 0731   HDL 54 07/08/2017 0731   CHOLHDL 3.1 07/08/2017 0731   CHOLHDL 4 05/27/2016 0950   VLDL 24.0 05/27/2016 0950   LDLCALC 89 07/08/2017 0731   LDLDIRECT 204.8 09/05/2013 1559    Additional studies/ records that were reviewed today include:  Echocardiography performed 07/20/2017: Study Conclusions   - Left ventricle: The cavity size was normal. There was severe   concentric hypertrophy. Systolic function was moderately to   severely reduced. The estimated ejection fraction was in the   range of 30% to 35%. Diffuse hypokinesis. Features are consistent   with a pseudonormal left ventricular filling pattern, with   concomitant abnormal relaxation and increased filling pressure   (grade 2 diastolic dysfunction). - Aortic valve: Trileaflet; mildly thickened, mildly calcified   leaflets. - Left atrium: The atrium was mildly dilated. - Right ventricle: The cavity size was moderately dilated. Wall   thickness was normal. Systolic function was moderately reduced. - Tricuspid valve: There was mild regurgitation. - Pulmonic valve: There was trivial regurgitation. - Pulmonary arteries: Systolic pressure was within the normal   range. - Inferior vena cava: The  vessel was normal in size. - Pericardium, extracardiac: There was no pericardial effusion.   Impressions:   - Since the last study on 01/21/2016 LVEF has decaresed from 50-55%   to 30-35%. RVEF is moderately decreased.  Stress Myoview 07/08/2017: Study Highlights     Nuclear stress EF: 47%.  Blood pressure demonstrated a hypertensive response to exercise.  There was no ST segment deviation noted during stress.  This is a low risk study.  The left ventricular ejection fraction is mildly decreased (45-54%).   1. EF 47%, mild diffuse hypokinesis.  2. No perfusion defects so no evidence for ischemia or infarction.    Low risk study, possible nonischemic cardiomyopathy.    Pre-cardiac surgery coronary angiography performed November 21, 2015:  Severe multivessel coronary disease with chronic total occlusion of the mid RCA, distal RCA collateralized from the left circumflex, 70% distal left main, segmental 90% ostial to proximal LAD with superimposed saccular aneurysm, 80% mid LAD, and 70% tandem stenoses in the first obtuse marginal. A branch of the first diagonal is totally occluded  Mild mid anterior wall hypokinesis. EF is estimated to be 55%. EDP was normal.   RECOMMENDATIONS:    Early positive exercise treadmill test, significant component of silent ischemia, and diabetes mellitus type 2 suggest revascularization as soon as possible. For that reason he will be admitted. And anginal therapy will be titrated.  TCTS consultation     ASSESSMENT:    1. Coronary artery disease involving native coronary artery of native heart without angina pectoris   2. Essential hypertension   3. Chronic systolic heart failure ([place])   4. Pericardial effusion      PLAN:  In order of problems listed above:  1. Recurring vague chest discomfort of uncertain etiology.  Cannot totally exclude the possibility of myocardial ischemia.  Sublingual nitroglycerin as advised. 2. Blood pressure is  adequately controlled for a diabetic.  Target is 130/85 mmHg or less. 3. Resume ACE inhibitor therapy in the form of lisinopril/benazepril 10 mg/[place].  Basic metabolic panel in 2-83 days.  Clinical follow-up in 1 month.  Optimization of heart  failure therapy will be done at that time. 4. Most recent echo demonstrates the pericardial effusion has resolved.  His situation is concerning.  LV function has dropped.  We will need to rule out the possibility of graft occlusion however myocardial perfusion imaging did not reveal significant perfusion abnormality or scar.  For decreased systolic function we will go ahead and optimize medical therapy.  If no improvement after optimization diagnostic angiography will be performed.    Medication Adjustments/Labs and Tests Ordered: Current medicines are reviewed at length with the patient today.  Concerns regarding medicines are outlined above.  Medication changes, Labs and Tests ordered today are listed in the Patient Instructions below. Patient Instructions  Medication Instructions:  1) START Benazepril 34m once daily 2)The proper use and anticipated side effects of nitroglycerine has been carefully explained.  If a single episode of chest pain is not relieved by one tablet, the patient will try another within 5 minutes; and if this doesn't relieve the pain, the patient is instructed to call 911 for transportation to an emergency department. You may use Nitro when you are having chest discomfort.  Please make sure you are sitting down if you use this.  Please remain seated for at least 10 minutes after taking.  Labwork: Your physician recommends that you return for lab work in: 10-14 days (BMET)   Testing/Procedures: None  Follow-Up: Your physician recommends that you schedule a follow-up appointment in: 4 weeks (Can have 10/27 at 11:40A)   Any Other Special Instructions Will Be Listed Below (If Applicable).     If you need a refill on your  cardiac medications before your next appointment, please call your pharmacy.      Signed, HSinclair Grooms MD  07/29/2017 12:38 PM    [facility] [address] [place]   [id number]: [phone] Fax: [phone]

## 2017-07-29 NOTE — Patient Instructions (Addendum)
Medication Instructions:  1) START Benazepril 10mg  once daily 2)The proper use and anticipated side effects of nitroglycerine has been carefully explained.  If a single episode of chest pain is not relieved by one tablet, the patient will try another within 5 minutes; and if this doesn't relieve the pain, the patient is instructed to call 911 for transportation to an emergency department. You may use Nitro when you are having chest discomfort.  Please make sure you are sitting down if you use this.  Please remain seated for at least 10 minutes after taking.  Labwork: Your physician recommends that you return for lab work in: 10-14 days (BMET)   Testing/Procedures: None  Follow-Up: Your physician recommends that you schedule a follow-up appointment in: 4 weeks (Can have 10/27 at 11:40A)   Any Other Special Instructions Will Be Listed Below (If Applicable).     If you need a refill on your cardiac medications before your next appointment, please call your pharmacy.

## 2017-07-30 ENCOUNTER — Telehealth: Payer: Self-pay | Admitting: Interventional Cardiology

## 2017-07-30 NOTE — Telephone Encounter (Signed)
Spoke with [facility] and they just wanted to make sure ok to fill Nitro since pt is taking Sildenafil.  Advised pt was educated at appt on proper use of both medications.  [facility] verbalized understanding and was appreciative for call.

## 2017-07-30 NOTE — Telephone Encounter (Signed)
Pt's pharmacy [facility] asking for clarification, pt is taking sildenafil and nitroglycerin, pharmacy stating that there is a contraindication between these medications. Please advise

## 2017-07-31 ENCOUNTER — Telehealth: Payer: Self-pay | Admitting: Interventional Cardiology

## 2017-07-31 NOTE — Telephone Encounter (Signed)
New message    Call from [facility]  [phone] ext [phone] ask for [facility].  Calling to confirm that Dr Lovena Le is aware pt is taking Viagra from another doctor. Wants to be sure patient can take medication while on Nitro. Please call.

## 2017-07-31 NOTE — Telephone Encounter (Signed)
Loren Racer, LPN    28/3/15 17:61 AM  Note    Spoke with [facility] and they just wanted to make sure ok to fill Nitro since pt is taking Sildenafil.  Advised pt was educated at appt on proper use of both medications.  [place] verbalized understanding and was appreciative for call     07/31/17--copied from phone note 07/30/17.  I spoke with Estill Bamberg, she has been given this information.

## 2017-08-11 ENCOUNTER — Other Ambulatory Visit: Payer: Self-pay | Admitting: Endocrinology

## 2017-08-12 ENCOUNTER — Other Ambulatory Visit: Payer: 59 | Admitting: *Deleted

## 2017-08-12 DIAGNOSIS — I10 Essential (primary) hypertension: Secondary | ICD-10-CM

## 2017-08-12 DIAGNOSIS — I2510 Atherosclerotic heart disease of native coronary artery without angina pectoris: Secondary | ICD-10-CM

## 2017-08-12 LAB — BASIC METABOLIC PANEL
BUN / CREAT RATIO: 23 (ref 10–24)
BUN: 26 mg/dL (ref 8–27)
CO2: 19 mmol/L — AB (ref 20–29)
CREATININE: 1.14 mg/dL (ref 0.76–1.27)
Calcium: 9.5 mg/dL (ref 8.6–10.2)
Chloride: 105 mmol/L (ref 96–106)
GFR calc Af Amer: 78 mL/min/{1.73_m2} (ref >59)
GFR, EST NON AFRICAN AMERICAN: 68 mL/min/{1.73_m2} (ref >59)
Glucose: 132 mg/dL — ABNORMAL HIGH (ref 65–99)
Potassium: 4.2 mmol/L (ref 3.5–5.2)
SODIUM: 140 mmol/L (ref 134–144)

## 2017-08-14 ENCOUNTER — Telehealth: Payer: Self-pay | Admitting: Interventional Cardiology

## 2017-08-14 NOTE — Telephone Encounter (Signed)
Patient returning call for lab results. 

## 2017-08-17 LAB — HM DIABETES EYE EXAM

## 2017-08-17 NOTE — Telephone Encounter (Signed)
Informed pt of lab results. Pt verbalized understanding. 

## 2017-08-18 ENCOUNTER — Other Ambulatory Visit: Payer: Self-pay | Admitting: Endocrinology

## 2017-08-25 NOTE — Progress Notes (Signed)
Cardiology Office Note    Date:  08/26/2017   ID:  Jerome Vaughn, DOB 11/16/1950, MRN [id number]  PCP:  Jerome Morale, MD  Cardiologist: Jerome Grooms, MD   Chief Complaint  Patient presents with  . Coronary Artery Disease  . Congestive Heart Failure    History of Present Illness:  Jerome Vaughn is a 66 y.o. 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 He was referred to [facility]. He was seen by Dr. Prescott Gum and underwent CABG with LIMA-LAD, SVG-diagonal, SVG-OM1/distal LCx, SVG-PDA on 11/22/15.  After bypass surgery he was readmitted to the hospital after developing chest discomfort.  Recently an LV assessment by echo demonstrated EF 30-35%.   He is here for optimization of medication follow-up.  LVEF has dropped.  Previous chest discomfort has lingered.  He is not use nitroglycerin as instructed.  He does note however that this type discomfort had completely gone away after surgery and over the past year he is starting to have more.   Past Medical History:  Diagnosis Date  . Carpal tunnel syndrome    both hands  . Coronary artery disease    a. Cath 11/21/2015: Multivessel CAD --> CABG recommended. b. CABG on 11/22/2015:  LIMA-LAD, SVG-Diag, SVG-OM1 and distal Cx, and SVG-PDA.  . Diabetes mellitus   . History of echocardiogram    a. Echo 4/17: Moderate LVH, EF 50-55%, mild LAE, no pericardial effusion  . Hyperlipidemia   . Hypertension     Past Surgical History:  Procedure Laterality Date  . CARDIAC CATHETERIZATION N/A 11/21/2015   Procedure: Left Heart Cath and Coronary Angiography;  Surgeon: [name], MD;  Location: [facility];  Service: Cardiovascular;  Laterality: N/A;  . colonsocopy  12/07/2009   per Dr. [name], benign polyps, repeat in 10 yrs   . CORONARY ARTERY BYPASS GRAFT N/A 11/22/2015   Procedure: CORONARY ARTERY BYPASS GRAFTING (CABG) times five using the left internal mammary, right greater saphenous vein EVH, and left thigh greater  saphenous vein EVH;  Surgeon: [name], MD;  Location: [facility];  Service: Open Heart Surgery;  Laterality: N/A;  . frozen shoulder release    . TEE WITHOUT CARDIOVERSION N/A 11/22/2015   Procedure: TRANSESOPHAGEAL ECHOCARDIOGRAM (TEE);  Surgeon: [name], MD;  Location: [facility];  Service: Open Heart Surgery;  Laterality: N/A;  . WRIST GANGLION EXCISION      Current Medications: Outpatient Medications Prior to Visit  Medication Sig Dispense Refill  . Ascorbic Acid (VITAMIN C) 100 MG tablet Take 100 mg by mouth daily.    Marland Kitchen aspirin EC 81 MG tablet Take 1 tablet (81 mg total) by mouth daily.    . benazepril (LOTENSIN) 10 MG tablet Take 1 tablet (10 mg total) by mouth daily. 30 tablet 1  . Blood Glucose Monitoring Suppl (ONETOUCH VERIO FLEX SYSTEM) w/Device KIT USE AS DIRECTED 1 kit 0  . Blood Glucose Monitoring Suppl (ONETOUCH VERIO FLEX SYSTEM) w/Device KIT USE AS DIRECTED 1 kit 0  . bromocriptine (PARLODEL) 2.5 MG tablet TAKE ONE-HALF TABLET BY MOUTH EVERY NIGHT AT BEDTIME 45 tablet 0  . CINNAMON PO Take 2,000 mg by mouth daily.     . empagliflozin (JARDIANCE) 25 MG TABS tablet Take 25 mg by mouth daily. 90 tablet 2  . glucose blood (ONETOUCH VERIO) test strip Use as instructed to check BG once a [place] 100 each 12  . JANUVIA 100 MG tablet TAKE ONE  TABLET BY MOUTH DAILY. 90 tablet 0  . Lancets (ACCU-CHEK SOFT TOUCH) lancets Dispense for Aviva plus and diagnosis code is E 11.9 100 each 0  . metFORMIN (GLUCOPHAGE-XR) 500 MG 24 hr tablet TAKE 4 TABLETS BY MOUTH ONCE DAILY 360 tablet 0  . metoprolol tartrate (LOPRESSOR) 25 MG tablet TAKE ONE AND ONE-HALF TABLETS BY MOUTH TWICE DAILY 270 tablet 3  . Multiple Vitamin (MULTIVITAMIN) tablet Take 1 tablet by mouth daily.      . nitroGLYCERIN (NITROSTAT) 0.4 MG SL tablet Place 1 tablet (0.4 mg total) under the tongue every 5 (five) minutes as needed for chest pain. 25 tablet 1  . Omega-3 Fatty Acids (FISH OIL) 1000 MG CAPS Take 1,000 mg by mouth  daily.     . rosuvastatin (CRESTOR) 40 MG tablet TAKE ONE TABLET BY MOUTH DAILY AS DIRECTED FOR CHOLESTEROL 90 tablet 3  . sildenafil (VIAGRA) 100 MG tablet Take 1 tablet (100 mg total) by mouth daily as needed for erectile dysfunction. 30 tablet 3   No facility-administered medications prior to visit.      Allergies:   Patient has no known allergies.   Social History   Socioeconomic History  . Marital status: Married    Spouse name: None  . Number of children: 5  . Years of education: None  . Highest education level: None  Social Needs  . Financial resource strain: None  . Food insecurity - worry: None  . Food insecurity - inability: None  . Transportation needs - medical: None  . Transportation needs - non-medical: None  Occupational History  . Occupation: Counsellor  Tobacco Use  . Smoking status: Former Smoker    Packs/Deetz: 0.25    Years: 20.00    Pack years: 5.00    Types: Cigarettes    Last attempt to quit: 10/07/2013    Years since quitting: 3.8  . Smokeless tobacco: Never Used  . Tobacco comment: quit cigarettes, might have a cigar 1 X month  Substance and Sexual Activity  . Alcohol use: Yes    Alcohol/week: 1.8 oz    Types: 3 Standard drinks or equivalent per week  . Drug use: No  . Sexual activity: None  Other Topics Concern  . 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   ROS:   Please see the history of present illness.    No complaints.  Walking continuously at work without exacerbation of discomfort or worsening of discomfort when it is present. All other systems reviewed and are  negative.   PHYSICAL EXAM:   VS:  BP 100/70   Pulse 78   Ht '5\' 7"'  (1.702 m)   Wt 188 lb 6.4 oz (85.5 kg)   SpO2 95%   BMI 29.51 kg/m    GEN: Well nourished, well developed, in no acute distress  HEENT: normal  Neck: no JVD, carotid bruits, or masses Cardiac: RRR; no murmurs, rubs, or gallops,no edema  Respiratory:  clear to auscultation bilaterally, normal work of breathing GI: soft, nontender, nondistended, + BS MS: no deformity or atrophy  Skin: warm and dry, no rash Neuro:  Alert and Oriented x 3, Strength and sensation are intact Psych: euthymic mood, full affect  Wt Readings from Last 3 Encounters:  08/26/17 188 lb 6.4 oz (85.5 kg)  07/29/17 189 lb 9.6 oz (86 kg)  07/08/17 186 lb (84.4 kg)      Studies/Labs Reviewed:   EKG:  EKG  Not done  Recent Labs: 07/08/2017: ALT 23; Hemoglobin 13.4; Platelets 132 08/12/2017: BUN 26; Creatinine, Ser 1.14; Potassium 4.2; Sodium 140   Lipid Panel    Component Value Date/Time   CHOL 169 07/08/2017 0731   TRIG 129 07/08/2017 0731   HDL 54 07/08/2017 0731   CHOLHDL 3.1 07/08/2017 0731   CHOLHDL 4 05/27/2016 0950   VLDL 24.0 05/27/2016 0950   LDLCALC 89 07/08/2017 0731   LDLDIRECT 204.8 09/05/2013 1559    Additional studies/ records that were reviewed today include:  No new data    ASSESSMENT:    1. Coronary artery disease involving native coronary artery of native heart without angina pectoris   2. Chronic systolic heart failure ([place])   3. Essential hypertension   4. Pericardial effusion   5. S/P CABG x 5   6. Tobacco use disorder      PLAN:  In order of problems listed above:  1. Probable angina.  Increase frequency of nitroglycerin use.  Record response and if nitroglycerin makes significant improvement in discomfort, he will need to have repeat catheterization. 2. Up titration of medical therapy to control/prevent progressive LV dysfunction is being well-tolerated although blood pressure does not allow  further up titration. 3. Excellent control 4. Resolved on last echo   88-monthfollow-up.  Record response to nitro.  Clinical follow-up if needed for chest pain.  If nitro relieves discomfort on a regular basis, we will perform angiography to rule out bypass graft failure.    Medication Adjustments/Labs and Tests Ordered: Current medicines are reviewed at length with the patient today.  Concerns regarding medicines are outlined above.  Medication changes, Labs and Tests ordered today are listed in the Patient Instructions below. There are no Patient Instructions on file for this visit.   Signed, HSinclair Grooms MD  08/26/2017 10:47 AM    [facility] [address] [place]  [id number]: [phone] Fax: [phone]

## 2017-08-26 ENCOUNTER — Ambulatory Visit (INDEPENDENT_AMBULATORY_CARE_PROVIDER_SITE_OTHER): Payer: 59 | Admitting: Interventional Cardiology

## 2017-08-26 ENCOUNTER — Encounter: Payer: Self-pay | Admitting: Interventional Cardiology

## 2017-08-26 VITALS — BP 100/70 | HR 78 | Ht 67.0 in | Wt 188.4 lb

## 2017-08-26 DIAGNOSIS — I313 Pericardial effusion (noninflammatory): Secondary | ICD-10-CM

## 2017-08-26 DIAGNOSIS — I5022 Chronic systolic (congestive) heart failure: Secondary | ICD-10-CM

## 2017-08-26 DIAGNOSIS — F17200 Nicotine dependence, unspecified, uncomplicated: Secondary | ICD-10-CM

## 2017-08-26 DIAGNOSIS — Z951 Presence of aortocoronary bypass graft: Secondary | ICD-10-CM

## 2017-08-26 DIAGNOSIS — I3139 Other pericardial effusion (noninflammatory): Secondary | ICD-10-CM

## 2017-08-26 DIAGNOSIS — I10 Essential (primary) hypertension: Secondary | ICD-10-CM

## 2017-08-26 DIAGNOSIS — I2510 Atherosclerotic heart disease of native coronary artery without angina pectoris: Secondary | ICD-10-CM | POA: Diagnosis not present

## 2017-08-26 MED ORDER — BENAZEPRIL HCL 10 MG PO TABS: 10.0000 mg | ORAL_TABLET | Freq: Every day | ORAL | 1 refills | Status: DC

## 2017-08-26 MED ORDER — BENAZEPRIL HCL 10 MG PO TABS: 10.0000 mg | ORAL_TABLET | Freq: Every day | ORAL | 3 refills | Status: DC

## 2017-08-26 NOTE — Patient Instructions (Signed)
Medication Instructions:  1) When you have chest discomfort, sit down and take a Nitro.  Record how often you are having the discomfort and using the Nitro and if it is giving relief.  Call the office in a week with an update.  Labwork: None  Testing/Procedures: None  Follow-Up: Your physician recommends that you schedule a follow-up appointment in: 4 months with Dr. [name].    Any Other Special Instructions Will Be Listed Below (If Applicable).     If you need a refill on your cardiac medications before your next appointment, please call your pharmacy.

## 2017-08-26 NOTE — Addendum Note (Signed)
Addended by: Loren Racer on: 08/26/2017 12:18 PM   Modules accepted: Orders

## 2017-09-14 ENCOUNTER — Other Ambulatory Visit: Payer: Self-pay

## 2017-09-14 MED ORDER — NITROGLYCERIN 0.4 MG SL SUBL: 0.4000 mg | SUBLINGUAL_TABLET | SUBLINGUAL | 5 refills | Status: DC | PRN

## 2017-10-14 ENCOUNTER — Telehealth: Payer: Self-pay | Admitting: Interventional Cardiology

## 2017-10-14 NOTE — Telephone Encounter (Signed)
New message  Patient calling with concerns of chest tightness.   Pt c/o of Chest Pain: STAT if CP now or developed within 24 hours  1. Are you having CP right now? Yes  2. Are you experiencing any other symptoms (ex. SOB, nausea, vomiting, sweating)? No  3. How long have you been experiencing CP? Started 1 week ago, worse today  4. Is your CP continuous or coming and going? Coming and going  5. Have you taken Nitroglycerin? yes?

## 2017-10-14 NOTE — Telephone Encounter (Signed)
Patient calling about chest pain underneath left breast that comes and goes. Patient stated this has been going on for a while now, since 09/27/17. Patient stated he took a nitro, and at first the pain got worse, but now it's easing off. Patient stated today's episode started when he was sitting at his desk at work. Patient myoview was low risk stress test. Patient stated Dr. [name] wanted him to call when he had CP and take nitro. Made an appointment with Dr. [name] for tomorrow. Will consult Dr. [name] on any further advisement. Dr. [name] had a few questions for the patient. Called patient back. Patient stated the longest episode he had was 25 minutes, and this felt different than the chest pain he had before his Bypass. Patient stated his chest pain is more of a discomfort and it is nagging and lingers. Patient stated if it becomes worse he will call 911. Will forward to Dr. [name] and his nurse.

## 2017-10-15 ENCOUNTER — Ambulatory Visit (INDEPENDENT_AMBULATORY_CARE_PROVIDER_SITE_OTHER): Payer: 59 | Admitting: Interventional Cardiology

## 2017-10-15 ENCOUNTER — Encounter: Payer: Self-pay | Admitting: Interventional Cardiology

## 2017-10-15 VITALS — BP 108/78 | HR 64 | Ht 67.5 in | Wt 188.4 lb

## 2017-10-15 DIAGNOSIS — R079 Chest pain, unspecified: Secondary | ICD-10-CM | POA: Diagnosis not present

## 2017-10-15 DIAGNOSIS — I313 Pericardial effusion (noninflammatory): Secondary | ICD-10-CM

## 2017-10-15 DIAGNOSIS — I10 Essential (primary) hypertension: Secondary | ICD-10-CM | POA: Diagnosis not present

## 2017-10-15 DIAGNOSIS — E118 Type 2 diabetes mellitus with unspecified complications: Secondary | ICD-10-CM

## 2017-10-15 DIAGNOSIS — I25709 Atherosclerosis of coronary artery bypass graft(s), unspecified, with unspecified angina pectoris: Secondary | ICD-10-CM | POA: Diagnosis not present

## 2017-10-15 DIAGNOSIS — I5022 Chronic systolic (congestive) heart failure: Secondary | ICD-10-CM | POA: Diagnosis not present

## 2017-10-15 DIAGNOSIS — I3139 Other pericardial effusion (noninflammatory): Secondary | ICD-10-CM

## 2017-10-15 MED ORDER — CARVEDILOL 3.125 MG PO TABS: 3.1250 mg | ORAL_TABLET | Freq: Two times a day (BID) | ORAL | 3 refills | Status: DC

## 2017-10-15 MED ORDER — BENAZEPRIL HCL 20 MG PO TABS: 20.0000 mg | ORAL_TABLET | Freq: Every day | ORAL | 3 refills | Status: DC

## 2017-10-15 NOTE — H&P (View-Only) (Signed)
Cardiology Office Note    Date:  10/15/2017   ID:  Jerome Vaughn, DOB Apr 20, 1951, MRN [id number]  PCP:  [name], MD  Cardiologist: Sinclair Grooms, MD   Chief Complaint  Patient presents with  . Coronary Artery Disease  . Chest Pain    History of Present Illness:  Jerome Vaughn is a 67 y.o. 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 He was referred to [facility]. He was seen by Dr. Prescott Gum and underwent CABG with LIMA-LAD, SVG-diagonal, SVG-OM1/distal LCx, SVG-PDA on 11/22/15.  After bypass surgery he was readmitted to the hospital after developing chest discomfort.  Recently an LV assessment by echo demonstrated EF 30-35%.   Starting approximately 6 months after surgery he has begun complaining of episodes of chest pain.  The patient is not doing well.  He is having vague recurring episodes of chest pain that began approximately 6-9 months ago.  Also during this time we have discovered that LV function is significantly decreased compared to prior.  He has been seen on multiple occasions since bypass surgery.  Most of the follow-ups have been with advanced providers.  My last visit was in November 2018.  I had him come in today because he found yesterday complaining of vague left chest discomfort.  He describes this by holding his hand over the left precordial area.  He denies dyspnea.  Perhaps activity aggravates the discomfort.  The discomfort is persistent.  It is different than his complaints prior to surgery.  The discomfort does not occur/is not made worse by lying flat.  There is no orthopnea or PND.   Past Medical History:  Diagnosis Date  . Carpal tunnel syndrome    both hands  . Coronary artery disease    a. Cath 11/21/2015: Multivessel CAD --> CABG recommended. b. CABG on 11/22/2015:  LIMA-LAD, SVG-Diag, SVG-OM1 and distal Cx, and SVG-PDA.  . Diabetes mellitus   . History of echocardiogram    a. Echo 4/17: Moderate LVH, EF 50-55%, mild LAE, no pericardial  effusion  . Hyperlipidemia   . Hypertension     Past Surgical History:  Procedure Laterality Date  . CARDIAC CATHETERIZATION N/A 11/21/2015   Procedure: Left Heart Cath and Coronary Angiography;  Surgeon: [name], MD;  Location: [facility];  Service: Cardiovascular;  Laterality: N/A;  . colonsocopy  12/07/2009   per Dr. [name], benign polyps, repeat in 10 yrs   . CORONARY ARTERY BYPASS GRAFT N/A 11/22/2015   Procedure: CORONARY ARTERY BYPASS GRAFTING (CABG) times five using the left internal mammary, right greater saphenous vein EVH, and left thigh greater saphenous vein EVH;  Surgeon: [name], MD;  Location: [facility];  Service: Open Heart Surgery;  Laterality: N/A;  . frozen shoulder release    . TEE WITHOUT CARDIOVERSION N/A 11/22/2015   Procedure: TRANSESOPHAGEAL ECHOCARDIOGRAM (TEE);  Surgeon: [name], MD;  Location: [facility];  Service: Open Heart Surgery;  Laterality: N/A;  . WRIST GANGLION EXCISION      Current Medications: Outpatient Medications Prior to Visit  Medication Sig Dispense Refill  . Ascorbic Acid (VITAMIN C) 100 MG tablet Take 100 mg by mouth daily.    Marland Kitchen aspirin EC 81 MG tablet Take 1 tablet (81 mg total) by mouth daily.    . Blood Glucose Monitoring Suppl (ONETOUCH VERIO FLEX SYSTEM) w/Device KIT USE AS DIRECTED 1 kit 0  . bromocriptine (PARLODEL) 2.5 MG tablet TAKE ONE-HALF  TABLET BY MOUTH EVERY NIGHT AT BEDTIME 45 tablet 0  . CINNAMON PO Take 2,000 mg by mouth daily.     . empagliflozin (JARDIANCE) 25 MG TABS tablet Take 25 mg by mouth daily. 90 tablet 2  . glucose blood (ONETOUCH VERIO) test strip Use as instructed to check BG once a [place] 100 each 12  . JANUVIA 100 MG tablet TAKE ONE TABLET BY MOUTH DAILY. 90 tablet 0  . Lancets (ACCU-CHEK SOFT TOUCH) lancets Dispense for Aviva plus and diagnosis code is E 11.9 100 each 0  . metFORMIN (GLUCOPHAGE-XR) 500 MG 24 hr tablet TAKE 4 TABLETS BY MOUTH ONCE DAILY 360 tablet 0  . Multiple Vitamin  (MULTIVITAMIN) tablet Take 1 tablet by mouth daily.      . nitroGLYCERIN (NITROSTAT) 0.4 MG SL tablet Place 1 tablet (0.4 mg total) under the tongue every 5 (five) minutes as needed for chest pain. 25 tablet 5  . Omega-3 Fatty Acids (FISH OIL) 1000 MG CAPS Take 1,000 mg by mouth daily.     . rosuvastatin (CRESTOR) 40 MG tablet TAKE ONE TABLET BY MOUTH DAILY AS DIRECTED FOR CHOLESTEROL 90 tablet 3  . sildenafil (VIAGRA) 100 MG tablet Take 1 tablet (100 mg total) by mouth daily as needed for erectile dysfunction. 30 tablet 3  . benazepril (LOTENSIN) 10 MG tablet Take 1 tablet (10 mg total) by mouth daily. 90 tablet 3  . metoprolol tartrate (LOPRESSOR) 25 MG tablet TAKE ONE AND ONE-HALF TABLETS BY MOUTH TWICE DAILY 270 tablet 3  . Blood Glucose Monitoring Suppl (ONETOUCH VERIO FLEX SYSTEM) w/Device KIT USE AS DIRECTED (Patient not taking: Reported on 10/15/2017) 1 kit 0   No facility-administered medications prior to visit.      Allergies:   Patient has no known allergies.   Social History   Socioeconomic History  . Marital status: Married    Spouse name: None  . Number of children: 5  . Years of education: None  . Highest education level: None  Social Needs  . Financial resource strain: None  . Food insecurity - worry: None  . Food insecurity - inability: None  . Transportation needs - medical: None  . Transportation needs - non-medical: None  Occupational History  . Occupation: Dispatcher  Tobacco Use  . Smoking status: Former Smoker    Packs/Bastyr: 0.25    Years: 20.00    Pack years: 5.00    Types: Cigarettes    Last attempt to quit: 10/07/2013    Years since quitting: 4.0  . Smokeless tobacco: Never Used  . Tobacco comment: quit cigarettes, might have a cigar 1 X month  Substance and Sexual Activity  . Alcohol use: Yes    Alcohol/week: 1.8 oz    Types: 3 Standard drinks or equivalent per week  . Drug use: No  . Sexual activity: None  Other Topics Concern  . 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   ROS:   Please see the history of present illness.    Concerned that he may be having indigestion.  He is back at work.  Not limited   by his current symptoms but there is vague continual discomfort but that is concerning. All other systems reviewed and are negative.   PHYSICAL EXAM:   VS:  BP 108/78   Pulse 64   Ht 5' 7.5" (1.715 m)   Wt 188 lb 6.4 oz (85.5 kg)   BMI 29.07 kg/m    GEN: Well nourished, well developed, in no acute distress  HEENT: normal  Neck: no JVD, carotid bruits, or masses Cardiac: RRR; no murmurs, rubs, or gallops,no edema  Respiratory:  clear to auscultation bilaterally, normal work of breathing GI: soft, nontender, nondistended, + BS MS: no deformity or atrophy  Skin: warm and dry, no rash Neuro:  Alert and Oriented x 3, Strength and sensation are intact Psych: euthymic mood, full affect  Wt Readings from Last 3 Encounters:  10/15/17 188 lb 6.4 oz (85.5 kg)  08/26/17 188 lb 6.4 oz (85.5 kg)  07/29/17 189 lb 9.6 oz (86 kg)      Studies/Labs Reviewed:   EKG:  EKG normal sinus rhythm, left anterior hemiblock, no change compared to September 2018.  Recent Labs: 07/08/2017: ALT 23; Hemoglobin 13.4; Platelets 132 08/12/2017: BUN 26; Creatinine, Ser 1.14; Potassium 4.2; Sodium 140   Lipid Panel    Component Value Date/Time   CHOL 169 07/08/2017 0731   TRIG 129 07/08/2017 0731   HDL 54 07/08/2017 0731   CHOLHDL 3.1 07/08/2017 0731   CHOLHDL 4 05/27/2016 0950   VLDL 24.0 05/27/2016 0950   LDLCALC 89 07/08/2017 0731   LDLDIRECT 204.8 09/05/2013 1559     Additional studies/ records that were reviewed today include:   Stress myocardial perfusion imaging 07/08/17: Study Highlights      Nuclear stress EF: 47%.  Blood pressure demonstrated a hypertensive response to exercise.  There was no ST segment deviation noted during stress.  This is a low risk study.  The left ventricular ejection fraction is mildly decreased (45-54%).   1. EF 47%, mild diffuse hypokinesis.  2. No perfusion defects so no evidence for ischemia or infarction.    Low risk study, possible nonischemic cardiomyopathy.    2D Doppler echocardiogram 07/20/2017: Study Conclusions   - Left ventricle: The cavity size was normal. There was severe   concentric hypertrophy. Systolic function was moderately to   severely reduced. The estimated ejection fraction was in the   range of 30% to 35%. Diffuse hypokinesis. Features are consistent   with a pseudonormal left ventricular filling pattern, with   concomitant abnormal relaxation and increased filling pressure   (grade 2 diastolic dysfunction). - Aortic valve: Trileaflet; mildly thickened, mildly calcified   leaflets. - Left atrium: The atrium was mildly dilated. - Right ventricle: The cavity size was moderately dilated. Wall   thickness was normal. Systolic function was moderately reduced. - Tricuspid valve: There was mild regurgitation. - Pulmonic valve: There was trivial regurgitation. - Pulmonary arteries: Systolic pressure was within the normal   range. - Inferior vena cava: The vessel was normal in size. - Pericardium, extracardiac: There was no pericardial effusion.   Impressions:   - Since the last study on 01/21/2016 LVEF has decaresed from 50-55%   to 30-35%. RVEF is moderately decreased.     ASSESSMENT:    1. Chronic systolic heart failure ([place])   2. Coronary artery disease involving coronary bypass graft of native heart with angina pectoris ([place])   3. Essential hypertension   4.  Pericardial effusion   5. Type 2 diabetes mellitus with complication, without  long-term current use of insulin ([facility])   6. Chest pain, unspecified type      PLAN:  In order of problems listed above:  1. Since bypass surgery the patient has developed significant systolic dysfunction.  EF is now approximately 35% down from 50% pre-surgery.  Uptitrate Lotensin to 20 mg/[place].  Change metoprolol tartrate to carvedilol 3.125 mg twice daily.  I have recommended that the patient undergo coronary angiography with bypass graft angiography and possible PCI to define whether or not graft occlusion is the source of the decreased LV function. 2. Vague chest discomfort could be due to myocardial ischemia.  This concern was temporarily shelved because of a relatively recent low risk myocardial perfusion study.  With the development of significant systolic dysfunction coronary angiography is needed to define bypass grafts and exclude occlusion. 3. Blood pressures control.  Uptitrate Lotensin and switch to carvedilol.  Further titration for purposes of LV preservation will need to be performed. 4. With relatively recent echo did not demonstrate any evidence of pericardial effusion which has been present in the past.  The patient was counseled to undergo left heart catheterization, coronary angiography, and possible percutaneous coronary intervention with stent implantation. The procedural risks and benefits were discussed in detail. The risks discussed included death, stroke, myocardial infarction, life-threatening bleeding, limb ischemia, kidney injury, allergy, and possible emergency cardiac surgery. The risk of these significant complications were estimated to occur less than 1% of the time. After discussion, the patient has agreed to proceed.  Medication Adjustments/Labs and Tests Ordered: Current medicines are reviewed at length with the patient today.  Concerns regarding medicines are outlined above.  Medication  changes, Labs and Tests ordered today are listed in the Patient Instructions below. Patient Instructions  Medication Instructions:  1) INCREASE Benazepril to 87m once daily 2) DISCONTINUE Metoprolol 3) START Carvedilol 3.1271mtwice daily   Labwork: BMET, CBC and INR today  Testing/Procedures: Your physician has requested that you have a cardiac catheterization. Cardiac catheterization is used to diagnose and/or treat various heart conditions. Doctors may recommend this procedure for a number of different reasons. The most common reason is to evaluate chest pain. Chest pain can be a symptom of coronary artery disease (CAD), and cardiac catheterization can show whether plaque is narrowing or blocking your heart's arteries. This procedure is also used to evaluate the valves, as well as measure the blood flow and oxygen levels in different parts of your heart. For further information please visit [email or web address] follow instruction sheet, as given.   Follow-Up: Your physician recommends that you schedule a follow-up appointment 2-3 weeks after catheterization with a PA or NP (cath is 1/29)   Any Other Special Instructions Will Be Listed Below (If Applicable).    [facility] [address] [id number]: [phone]: [phone] Boehlkeay  10/15/2017  You are scheduled for a Cardiac Catheterization on Tuesday, January 29 with Dr. HeDaneen Schick 1. [facility] [id number] 8:00 AM (two hours before your procedure to ensure your preparation). Free valet parking service is available.   Special note: Every effort is made to have your procedure done on time. Please understand that emergencies sometimes delay scheduled procedures.  2. Diet: Do not eat or drink anything  after midnight prior to your procedure except sips of  water to take medications.  3. Labs: Will be completed today.  4. Medication instructions in preparation for your procedure:   Do not take your Jardiance or Januvia the morning of your procedure.  Do not take your Metformin for 24 hours prior to your procedure or 48 hours after.     On the morning of your procedure, take your Aspirin and any morning medicines NOT listed above.  You may use sips of water.  5. Plan for one night stay--bring personal belongings. 6. Bring a current list of your medications and current insurance cards. 7. You MUST have a responsible person to drive you home. 8. Someone MUST be with you the first 24 hours after you arrive home or your discharge will be delayed. 9. Please wear clothes that are easy to get on and off and wear slip-on shoes.  Thank you for allowing [place] to care for you!   -- [facility]    If you need a refill on your cardiac medications before your next appointment, please call your pharmacy.      Signed, Sinclair Grooms, MD  10/15/2017 12:44 PM    [facility] [place], [place], [place]  [address] Phone: [phone]; Fax: [phone]

## 2017-10-15 NOTE — Patient Instructions (Signed)
Medication Instructions:  1) INCREASE Benazepril to 20mg  once daily 2) DISCONTINUE Metoprolol 3) START Carvedilol 3.125mg  twice daily   Labwork: BMET, CBC and INR today  Testing/Procedures: Your physician has requested that you have a cardiac catheterization. Cardiac catheterization is used to diagnose and/or treat various heart conditions. Doctors may recommend this procedure for a number of different reasons. The most common reason is to evaluate chest pain. Chest pain can be a symptom of coronary artery disease (CAD), and cardiac catheterization can show whether plaque is narrowing or blocking your heart's arteries. This procedure is also used to evaluate the valves, as well as measure the blood flow and oxygen levels in different parts of your heart. For further information please visit HugeFiesta.[email or web address]. Please follow instruction sheet, as given.   Follow-Up: Your physician recommends that you schedule a follow-up appointment 2-3 weeks after catheterization with a PA or NP (cath is 1/29)   Any Other Special Instructions Will Be Listed Below (If Applicable).    [facility] [address], [address] [place] [address] Dept: [phone] Loc: [phone]  Jerome Vaughn  10/15/2017  You are scheduled for a Cardiac Catheterization on Tuesday, January 29 with Dr. Daneen Schick.  1. Please arrive at the [facility] (Main Entrance A) at [facility]: [address] [place], [place] [address] at 8:00 AM (two hours before your procedure to ensure your preparation). Free valet parking service is available.   Special note: Every effort is made to have your procedure done on time. Please understand that emergencies sometimes delay scheduled procedures.  2. Diet: Do not eat or drink anything after midnight prior to your procedure except sips of water to take medications.  3. Labs: Will be completed  today.  4. Medication instructions in preparation for your procedure:   Do not take your Jardiance or Januvia the morning of your procedure.  Do not take your Metformin for 24 hours prior to your procedure or 48 hours after.     On the morning of your procedure, take your Aspirin and any morning medicines NOT listed above.  You may use sips of water.  5. Plan for one night stay--bring personal belongings. 6. Bring a current list of your medications and current insurance cards. 7. You MUST have a responsible person to drive you home. 8. Someone MUST be with you the first 24 hours after you arrive home or your discharge will be delayed. 9. Please wear clothes that are easy to get on and off and wear slip-on shoes.  Thank you for allowing [place] to care for you!   -- [facility]    If you need a refill on your cardiac medications before your next appointment, please call your pharmacy.

## 2017-10-15 NOTE — Telephone Encounter (Signed)
Spoke with Dr. [name] and he said it would be fine to still have dental extraction but pt could have increased bleeding d/t having to take the ASA the [place] of cath.  Advised pt of this information.  Pt is going to try to move dental work.  Pt appreciative for call.

## 2017-10-15 NOTE — Telephone Encounter (Signed)
Jerome Vaughn is calling because Dr.Smith is to have a cath done , beut he is supposed to have a tooth extraction bon Monday 10/26/17 and the cath is schedule for Tuesday 10/27/17. He is wanting to know will that be a confliction .Please call

## 2017-10-15 NOTE — Progress Notes (Signed)
Cardiology Office Note    Date:  10/15/2017   ID:  Jerome Vaughn, DOB Apr 20, 1951, MRN [id number]  PCP:  [name], MD  Cardiologist: Sinclair Grooms, MD   Chief Complaint  Patient presents with  . Coronary Artery Disease  . Chest Pain    History of Present Illness:  Jerome Vaughn is a 67 y.o. 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 He was referred to [facility]. He was seen by Dr. Prescott Gum and underwent CABG with LIMA-LAD, SVG-diagonal, SVG-OM1/distal LCx, SVG-PDA on 11/22/15.  After bypass surgery he was readmitted to the hospital after developing chest discomfort.  Recently an LV assessment by echo demonstrated EF 30-35%.   Starting approximately 6 months after surgery he has begun complaining of episodes of chest pain.  The patient is not doing well.  He is having vague recurring episodes of chest pain that began approximately 6-9 months ago.  Also during this time we have discovered that LV function is significantly decreased compared to prior.  He has been seen on multiple occasions since bypass surgery.  Most of the follow-ups have been with advanced providers.  My last visit was in November 2018.  I had him come in today because he found yesterday complaining of vague left chest discomfort.  He describes this by holding his hand over the left precordial area.  He denies dyspnea.  Perhaps activity aggravates the discomfort.  The discomfort is persistent.  It is different than his complaints prior to surgery.  The discomfort does not occur/is not made worse by lying flat.  There is no orthopnea or PND.   Past Medical History:  Diagnosis Date  . Carpal tunnel syndrome    both hands  . Coronary artery disease    a. Cath 11/21/2015: Multivessel CAD --> CABG recommended. b. CABG on 11/22/2015:  LIMA-LAD, SVG-Diag, SVG-OM1 and distal Cx, and SVG-PDA.  . Diabetes mellitus   . History of echocardiogram    a. Echo 4/17: Moderate LVH, EF 50-55%, mild LAE, no pericardial  effusion  . Hyperlipidemia   . Hypertension     Past Surgical History:  Procedure Laterality Date  . CARDIAC CATHETERIZATION N/A 11/21/2015   Procedure: Left Heart Cath and Coronary Angiography;  Surgeon: [name], MD;  Location: [place] [facility];  Service: Cardiovascular;  Laterality: N/A;  . colonsocopy  12/07/2009   per Dr. [name], benign polyps, repeat in 10 yrs   . CORONARY ARTERY BYPASS GRAFT N/A 11/22/2015   Procedure: CORONARY ARTERY BYPASS GRAFTING (CABG) times five using the left internal mammary, right greater saphenous vein EVH, and left thigh greater saphenous vein EVH;  Surgeon: [name], MD;  Location: [facility];  Service: Open Heart Surgery;  Laterality: N/A;  . frozen shoulder release    . TEE WITHOUT CARDIOVERSION N/A 11/22/2015   Procedure: TRANSESOPHAGEAL ECHOCARDIOGRAM (TEE);  Surgeon: [name], MD;  Location: [facility];  Service: Open Heart Surgery;  Laterality: N/A;  . WRIST GANGLION EXCISION      Current Medications: Outpatient Medications Prior to Visit  Medication Sig Dispense Refill  . Ascorbic Acid (VITAMIN C) 100 MG tablet Take 100 mg by mouth daily.    Marland Kitchen aspirin EC 81 MG tablet Take 1 tablet (81 mg total) by mouth daily.    . Blood Glucose Monitoring Suppl (ONETOUCH VERIO FLEX SYSTEM) w/Device KIT USE AS DIRECTED 1 kit 0  . bromocriptine (PARLODEL) 2.5 MG tablet TAKE ONE-HALF  TABLET BY MOUTH EVERY NIGHT AT BEDTIME 45 tablet 0  . CINNAMON PO Take 2,000 mg by mouth daily.     . empagliflozin (JARDIANCE) 25 MG TABS tablet Take 25 mg by mouth daily. 90 tablet 2  . glucose blood (ONETOUCH VERIO) test strip Use as instructed to check BG once a [place] 100 each 12  . JANUVIA 100 MG tablet TAKE ONE TABLET BY MOUTH DAILY. 90 tablet 0  . Lancets (ACCU-CHEK SOFT TOUCH) lancets Dispense for Aviva plus and diagnosis code is E 11.9 100 each 0  . metFORMIN (GLUCOPHAGE-XR) 500 MG 24 hr tablet TAKE 4 TABLETS BY MOUTH ONCE DAILY 360 tablet 0  . Multiple Vitamin  (MULTIVITAMIN) tablet Take 1 tablet by mouth daily.      . nitroGLYCERIN (NITROSTAT) 0.4 MG SL tablet Place 1 tablet (0.4 mg total) under the tongue every 5 (five) minutes as needed for chest pain. 25 tablet 5  . Omega-3 Fatty Acids (FISH OIL) 1000 MG CAPS Take 1,000 mg by mouth daily.     . rosuvastatin (CRESTOR) 40 MG tablet TAKE ONE TABLET BY MOUTH DAILY AS DIRECTED FOR CHOLESTEROL 90 tablet 3  . sildenafil (VIAGRA) 100 MG tablet Take 1 tablet (100 mg total) by mouth daily as needed for erectile dysfunction. 30 tablet 3  . benazepril (LOTENSIN) 10 MG tablet Take 1 tablet (10 mg total) by mouth daily. 90 tablet 3  . metoprolol tartrate (LOPRESSOR) 25 MG tablet TAKE ONE AND ONE-HALF TABLETS BY MOUTH TWICE DAILY 270 tablet 3  . Blood Glucose Monitoring Suppl ([place]) w/Device KIT USE AS DIRECTED (Patient not taking: Reported on 10/15/2017) 1 kit 0   No facility-administered medications prior to visit.      Allergies:   Patient has no known allergies.   Social History   Socioeconomic History  . Marital status: Married    Spouse name: None  . Number of children: 5  . Years of education: None  . Highest education level: None  Social Needs  . Financial resource strain: None  . Food insecurity - worry: None  . Food insecurity - inability: None  . Transportation needs - medical: None  . Transportation needs - non-medical: None  Occupational History  . Occupation: Counsellor  Tobacco Use  . Smoking status: Former Smoker    Packs/Cullins: 0.25    Years: 20.00    Pack years: 5.00    Types: Cigarettes    Last attempt to quit: 10/07/2013    Years since quitting: 4.0  . Smokeless tobacco: Never Used  . Tobacco comment: quit cigarettes, might have a cigar 1 X month  Substance and Sexual Activity  . Alcohol use: Yes    Alcohol/week: 1.8 oz    Types: 3 Standard drinks or equivalent per week  . Drug use: No  . Sexual activity: None  Other Topics Concern  . 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   ROS:   Please see the history of present illness.    Concerned that he may be having indigestion.  He is back at work.  Not limited  by his current symptoms but there is vague continual discomfort but that is concerning. All other systems reviewed and are negative.   PHYSICAL EXAM:   VS:  BP 108/78   Pulse 64   Ht 5' 7.5" (1.715 m)   Wt 188 lb 6.4 oz (85.5 kg)   BMI 29.07 kg/m    GEN: Well nourished, well developed, in no acute distress  HEENT: normal  Neck: no JVD, carotid bruits, or masses Cardiac: RRR; no murmurs, rubs, or gallops,no edema  Respiratory:  clear to auscultation bilaterally, normal work of breathing GI: soft, nontender, nondistended, + BS MS: no deformity or atrophy  Skin: warm and dry, no rash Neuro:  Alert and Oriented x 3, Strength and sensation are intact Psych: euthymic mood, full affect  Wt Readings from Last 3 Encounters:  10/15/17 188 lb 6.4 oz (85.5 kg)  08/26/17 188 lb 6.4 oz (85.5 kg)  07/29/17 189 lb 9.6 oz (86 kg)      Studies/Labs Reviewed:   EKG:  EKG normal sinus rhythm, left anterior hemiblock, no change compared to September 2018.  Recent Labs: 07/08/2017: ALT 23; Hemoglobin 13.4; Platelets 132 08/12/2017: BUN 26; Creatinine, Ser 1.14; Potassium 4.2; Sodium 140   Lipid Panel    Component Value Date/Time   CHOL 169 07/08/2017 0731   TRIG 129 07/08/2017 0731   HDL 54 07/08/2017 0731   CHOLHDL 3.1 07/08/2017 0731   CHOLHDL 4 05/27/2016 0950   VLDL 24.0 05/27/2016 0950   LDLCALC 89 07/08/2017 0731   LDLDIRECT 204.8 09/05/2013 1559     Additional studies/ records that were reviewed today include:   Stress myocardial perfusion imaging 07/08/17: Study Highlights      Nuclear stress EF: 47%.  Blood pressure demonstrated a hypertensive response to exercise.  There was no ST segment deviation noted during stress.  This is a low risk study.  The left ventricular ejection fraction is mildly decreased (45-54%).   1. EF 47%, mild diffuse hypokinesis.  2. No perfusion defects so no evidence for ischemia or infarction.    Low risk study, possible nonischemic cardiomyopathy.    2D Doppler echocardiogram 07/20/2017: Study Conclusions   - Left ventricle: The cavity size was normal. There was severe   concentric hypertrophy. Systolic function was moderately to   severely reduced. The estimated ejection fraction was in the   range of 30% to 35%. Diffuse hypokinesis. Features are consistent   with a pseudonormal left ventricular filling pattern, with   concomitant abnormal relaxation and increased filling pressure   (grade 2 diastolic dysfunction). - Aortic valve: Trileaflet; mildly thickened, mildly calcified   leaflets. - Left atrium: The atrium was mildly dilated. - Right ventricle: The cavity size was moderately dilated. Wall   thickness was normal. Systolic function was moderately reduced. - Tricuspid valve: There was mild regurgitation. - Pulmonic valve: There was trivial regurgitation. - Pulmonary arteries: Systolic pressure was within the normal   range. - Inferior vena cava: The vessel was normal in size. - Pericardium, extracardiac: There was no pericardial effusion.   Impressions:   - Since the last study on 01/21/2016 LVEF has decaresed from 50-55%   to 30-35%. RVEF is moderately decreased.     ASSESSMENT:    1. Chronic systolic heart failure ([place])   2. Coronary artery disease involving coronary bypass graft of native heart with angina pectoris ([place])   3. Essential hypertension   4.  Pericardial effusion   5. Type 2 diabetes mellitus with complication, without  long-term current use of insulin (H[facility])   6. Chest pain, unspecified type      PLAN:  In order of problems listed above:  1. Since bypass surgery the patient has developed significant systolic dysfunction.  EF is now approximately 35% down from 50% pre-surgery.  Uptitrate Lotensin to 20 mg/[place].  Change metoprolol tartrate to carvedilol 3.125 mg twice daily.  I have recommended that the patient undergo coronary angiography with bypass graft angiography and possible PCI to define whether or not graft occlusion is the source of the decreased LV function. 2. Vague chest discomfort could be due to myocardial ischemia.  This concern was temporarily shelved because of a relatively recent low risk myocardial perfusion study.  With the development of significant systolic dysfunction coronary angiography is needed to define bypass grafts and exclude occlusion. 3. Blood pressures control.  Uptitrate Lotensin and switch to carvedilol.  Further titration for purposes of LV preservation will need to be performed. 4. With relatively recent echo did not demonstrate any evidence of pericardial effusion which has been present in the past.  The patient was counseled to undergo left heart catheterization, coronary angiography, and possible percutaneous coronary intervention with stent implantation. The procedural risks and benefits were discussed in detail. The risks discussed included death, stroke, myocardial infarction, life-threatening bleeding, limb ischemia, kidney injury, allergy, and possible emergency cardiac surgery. The risk of these significant complications were estimated to occur less than 1% of the time. After discussion, the patient has agreed to proceed.  Medication Adjustments/Labs and Tests Ordered: Current medicines are reviewed at length with the patient today.  Concerns regarding medicines are outlined above.  Medication  changes, Labs and Tests ordered today are listed in the Patient Instructions below. Patient Instructions  Medication Instructions:  1) INCREASE Benazepril to 87m once daily 2) DISCONTINUE Metoprolol 3) START Carvedilol 3.1271mtwice daily   Labwork: BMET, CBC and INR today  Testing/Procedures: Your physician has requested that you have a cardiac catheterization. Cardiac catheterization is used to diagnose and/or treat various heart conditions. Doctors may recommend this procedure for a number of different reasons. The most common reason is to evaluate chest pain. Chest pain can be a symptom of coronary artery disease (CAD), and cardiac catheterization can show whether plaque is narrowing or blocking your heart's arteries. This procedure is also used to evaluate the valves, as well as measure the blood flow and oxygen levels in different parts of your heart. For further information please visit [email or web address] follow instruction sheet, as given.   Follow-Up: Your physician recommends that you schedule a follow-up appointment 2-3 weeks after catheterization with a PA or NP (cath is 1/29)   Any Other Special Instructions Will Be Listed Below (If Applicable).    COGatewayFFICE [address] [id number]: [phone]: [phone] Boehlkeay  10/15/2017  You are scheduled for a Cardiac Catheterization on Tuesday, January 29 with Dr. HeDaneen Schick 1. [facility] [id number] 8:00 AM (two hours before your procedure to ensure your preparation). Free valet parking service is available.   Special note: Every effort is made to have your procedure done on time. Please understand that emergencies sometimes delay scheduled procedures.  2. Diet: Do not eat or drink anything  after midnight prior to your procedure except sips of  water to take medications.  3. Labs: Will be completed today.  4. Medication instructions in preparation for your procedure:   Do not take your Jardiance or Januvia the morning of your procedure.  Do not take your Metformin for 24 hours prior to your procedure or 48 hours after.     On the morning of your procedure, take your Aspirin and any morning medicines NOT listed above.  You may use sips of water.  5. Plan for one night stay--bring personal belongings. 6. Bring a current list of your medications and current insurance cards. 7. You MUST have a responsible person to drive you home. 8. Someone MUST be with you the first 24 hours after you arrive home or your discharge will be delayed. 9. Please wear clothes that are easy to get on and off and wear slip-on shoes.  Thank you for allowing [place] to care for you!   -- [facility]    If you need a refill on your cardiac medications before your next appointment, please call your pharmacy.      Signed, Sinclair Grooms, MD  10/15/2017 12:44 PM    [facility] [place], [place],   [address] Phone: [phone]; Fax: [phone]

## 2017-10-16 LAB — BASIC METABOLIC PANEL
BUN/Creatinine Ratio: 18 (ref 10–24)
BUN: 21 mg/dL (ref 8–27)
CALCIUM: 10.2 mg/dL (ref 8.6–10.2)
CHLORIDE: 99 mmol/L (ref 96–106)
CO2: 20 mmol/L (ref 20–29)
Creatinine, Ser: 1.14 mg/dL (ref 0.76–1.27)
GFR calc Af Amer: 78 mL/min/{1.73_m2} (ref >59)
GFR, EST NON AFRICAN AMERICAN: 68 mL/min/{1.73_m2} (ref >59)
GLUCOSE: 139 mg/dL — AB (ref 65–99)
POTASSIUM: 5.2 mmol/L (ref 3.5–5.2)
SODIUM: 138 mmol/L (ref 134–144)

## 2017-10-16 LAB — CBC
HEMOGLOBIN: 13.3 g/dL (ref 13.0–17.7)
Hematocrit: 40.6 % (ref 37.5–51.0)
MCH: 29 pg (ref 26.6–33.0)
MCHC: 32.8 g/dL (ref 31.5–35.7)
MCV: 89 fL (ref 79–97)
Platelets: 143 10*3/uL — ABNORMAL LOW (ref 150–379)
RBC: 4.59 x10E6/uL (ref 4.14–5.80)
RDW: 14.4 % (ref 12.3–15.4)
WBC: 4.7 10*3/uL (ref 3.4–10.8)

## 2017-10-16 LAB — PROTIME-INR
INR: 1.1 (ref 0.8–1.2)
PROTHROMBIN TIME: 11.1 s (ref 9.1–12.0)

## 2017-10-19 ENCOUNTER — Telehealth: Payer: Self-pay | Admitting: Endocrinology

## 2017-10-19 NOTE — Telephone Encounter (Signed)
I called patient & asked him to refer to his PCP.

## 2017-10-19 NOTE — Telephone Encounter (Signed)
Patient wants a Hepatitis screening, Pneumonia shot  and a Flu shot. Should he make appointment with Dr Sarajane Jews (his PCP) or Dr. Loanne Drilling? Please advise patient at ph# [phone]

## 2017-10-19 NOTE — Telephone Encounter (Signed)
PCP?

## 2017-10-26 ENCOUNTER — Telehealth: Payer: Self-pay | Admitting: *Deleted

## 2017-10-26 NOTE — Telephone Encounter (Signed)
Patient contacted pre-catheterization at [facility] scheduled for: 10/27/17 10:30 AM Verified arrival time and place: [facility] NT/Main A 8 AM  Confirmed AM meds to be taken pre-cath with sip of water: Take ASA 81 mg am of  HOLD: Metformin 24 hours prior (today), am of and 48 hours after. Jardiance am of Januvia am of Do not use Viagra  Confirmed patient has responsible person to drive home post procedure and observe patient for 24 hours: yes

## 2017-10-27 ENCOUNTER — Other Ambulatory Visit: Payer: Self-pay

## 2017-10-27 ENCOUNTER — Ambulatory Visit (HOSPITAL_COMMUNITY)
Admission: RE | Disposition: A | Discharge status: Home / Self Care | Payer: Self-pay | Source: Ambulatory Visit | Attending: Interventional Cardiology

## 2017-10-27 ENCOUNTER — Ambulatory Visit (HOSPITAL_COMMUNITY)
Admission: RE | Admit: 2017-10-27 | Discharge: 2017-10-27 | Disposition: A | Discharge status: Home / Self Care | Payer: 59 | Source: Ambulatory Visit | Attending: Interventional Cardiology | Admitting: Interventional Cardiology

## 2017-10-27 DIAGNOSIS — Z7982 Long term (current) use of aspirin: Secondary | ICD-10-CM | POA: Insufficient documentation

## 2017-10-27 DIAGNOSIS — E7800 Pure hypercholesterolemia, unspecified: Secondary | ICD-10-CM | POA: Diagnosis present

## 2017-10-27 DIAGNOSIS — I110 Hypertensive heart disease with heart failure: Secondary | ICD-10-CM | POA: Insufficient documentation

## 2017-10-27 DIAGNOSIS — I25708 Atherosclerosis of coronary artery bypass graft(s), unspecified, with other forms of angina pectoris: Secondary | ICD-10-CM | POA: Diagnosis present

## 2017-10-27 DIAGNOSIS — I25709 Atherosclerosis of coronary artery bypass graft(s), unspecified, with unspecified angina pectoris: Secondary | ICD-10-CM

## 2017-10-27 DIAGNOSIS — I2510 Atherosclerotic heart disease of native coronary artery without angina pectoris: Secondary | ICD-10-CM | POA: Insufficient documentation

## 2017-10-27 DIAGNOSIS — G5603 Carpal tunnel syndrome, bilateral upper limbs: Secondary | ICD-10-CM | POA: Insufficient documentation

## 2017-10-27 DIAGNOSIS — Z794 Long term (current) use of insulin: Secondary | ICD-10-CM | POA: Insufficient documentation

## 2017-10-27 DIAGNOSIS — Z833 Family history of diabetes mellitus: Secondary | ICD-10-CM | POA: Insufficient documentation

## 2017-10-27 DIAGNOSIS — Z79899 Other long term (current) drug therapy: Secondary | ICD-10-CM | POA: Insufficient documentation

## 2017-10-27 DIAGNOSIS — E785 Hyperlipidemia, unspecified: Secondary | ICD-10-CM | POA: Diagnosis not present

## 2017-10-27 DIAGNOSIS — I313 Pericardial effusion (noninflammatory): Secondary | ICD-10-CM | POA: Insufficient documentation

## 2017-10-27 DIAGNOSIS — Z87891 Personal history of nicotine dependence: Secondary | ICD-10-CM | POA: Insufficient documentation

## 2017-10-27 DIAGNOSIS — E119 Type 2 diabetes mellitus without complications: Secondary | ICD-10-CM | POA: Insufficient documentation

## 2017-10-27 DIAGNOSIS — Z8249 Family history of ischemic heart disease and other diseases of the circulatory system: Secondary | ICD-10-CM | POA: Insufficient documentation

## 2017-10-27 DIAGNOSIS — Z841 Family history of disorders of kidney and ureter: Secondary | ICD-10-CM | POA: Insufficient documentation

## 2017-10-27 DIAGNOSIS — I5022 Chronic systolic (congestive) heart failure: Secondary | ICD-10-CM | POA: Diagnosis not present

## 2017-10-27 DIAGNOSIS — I25810 Atherosclerosis of coronary artery bypass graft(s) without angina pectoris: Secondary | ICD-10-CM | POA: Diagnosis not present

## 2017-10-27 DIAGNOSIS — Z7984 Long term (current) use of oral hypoglycemic drugs: Secondary | ICD-10-CM | POA: Insufficient documentation

## 2017-10-27 DIAGNOSIS — I2584 Coronary atherosclerosis due to calcified coronary lesion: Secondary | ICD-10-CM | POA: Insufficient documentation

## 2017-10-27 DIAGNOSIS — I10 Essential (primary) hypertension: Secondary | ICD-10-CM | POA: Diagnosis present

## 2017-10-27 DIAGNOSIS — F17200 Nicotine dependence, unspecified, uncomplicated: Secondary | ICD-10-CM | POA: Diagnosis present

## 2017-10-27 DIAGNOSIS — I2582 Chronic total occlusion of coronary artery: Secondary | ICD-10-CM | POA: Diagnosis not present

## 2017-10-27 DIAGNOSIS — E118 Type 2 diabetes mellitus with unspecified complications: Secondary | ICD-10-CM | POA: Diagnosis present

## 2017-10-27 HISTORY — PX: LEFT HEART CATH AND CORS/GRAFTS ANGIOGRAPHY: CATH118250

## 2017-10-27 LAB — GLUCOSE, CAPILLARY
GLUCOSE-CAPILLARY: 148 mg/dL — AB (ref 65–99)
GLUCOSE-CAPILLARY: 173 mg/dL — AB (ref 65–99)

## 2017-10-27 SURGERY — LEFT HEART CATH AND CORS/GRAFTS ANGIOGRAPHY
Anesthesia: LOCAL

## 2017-10-27 MED ORDER — NITROGLYCERIN 0.4 MG SL SUBL: 0.4000 mg | SUBLINGUAL_TABLET | SUBLINGUAL | Status: DC | PRN

## 2017-10-27 MED ORDER — SODIUM CHLORIDE 0.9 % IV SOLN: 250.0000 mL | INTRAVENOUS | Status: DC | PRN

## 2017-10-27 MED ORDER — ASPIRIN 81 MG PO CHEW: 81.0000 mg | CHEWABLE_TABLET | ORAL | Status: DC

## 2017-10-27 MED ORDER — FENTANYL CITRATE (PF) 100 MCG/2ML IJ SOLN
INTRAMUSCULAR | Status: DC | PRN
  Administered 2017-10-27: 50 ug via INTRAVENOUS

## 2017-10-27 MED ORDER — IOPAMIDOL (ISOVUE-370) INJECTION 76%
INTRAVENOUS | Status: AC
  Filled 2017-10-27: qty 125

## 2017-10-27 MED ORDER — SODIUM CHLORIDE 0.9% FLUSH: 3.0000 mL | Freq: Two times a day (BID) | INTRAVENOUS | Status: DC

## 2017-10-27 MED ORDER — SITAGLIPTIN PHOSPHATE 100 MG PO TABS: 100.0000 mg | ORAL_TABLET | Freq: Every day | ORAL | 0 refills | Status: DC

## 2017-10-27 MED ORDER — ASPIRIN 81 MG PO CHEW: 81.0000 mg | CHEWABLE_TABLET | Freq: Every day | ORAL | Status: DC

## 2017-10-27 MED ORDER — HEPARIN SODIUM (PORCINE) 1000 UNIT/ML IJ SOLN
INTRAMUSCULAR | Status: AC
  Filled 2017-10-27: qty 1

## 2017-10-27 MED ORDER — EMPAGLIFLOZIN 25 MG PO TABS: 25.0000 mg | ORAL_TABLET | Freq: Every day | ORAL | 2 refills | Status: DC

## 2017-10-27 MED ORDER — HEPARIN (PORCINE) IN NACL 2-0.9 UNIT/ML-% IJ SOLN
INTRAMUSCULAR | Status: AC
  Filled 2017-10-27: qty 1000

## 2017-10-27 MED ORDER — SODIUM CHLORIDE 0.9 % IV SOLN: INTRAVENOUS | Status: DC

## 2017-10-27 MED ORDER — MIDAZOLAM HCL 2 MG/2ML IJ SOLN
INTRAMUSCULAR | Status: AC
  Filled 2017-10-27: qty 2

## 2017-10-27 MED ORDER — HEPARIN SODIUM (PORCINE) 1000 UNIT/ML IJ SOLN
INTRAMUSCULAR | Status: DC | PRN
  Administered 2017-10-27: 5000 [IU] via INTRAVENOUS

## 2017-10-27 MED ORDER — SODIUM CHLORIDE 0.9 % WEIGHT BASED INFUSION
3.0000 mL/kg/h | INTRAVENOUS | Status: AC
  Administered 2017-10-27: 3 mL/kg/h via INTRAVENOUS

## 2017-10-27 MED ORDER — LIDOCAINE HCL (PF) 1 % IJ SOLN
INTRAMUSCULAR | Status: DC | PRN
  Administered 2017-10-27: 2 mL

## 2017-10-27 MED ORDER — IOPAMIDOL (ISOVUE-370) INJECTION 76%
INTRAVENOUS | Status: DC | PRN
  Administered 2017-10-27: 155 mL via INTRA_ARTERIAL

## 2017-10-27 MED ORDER — FENTANYL CITRATE (PF) 100 MCG/2ML IJ SOLN
INTRAMUSCULAR | Status: AC
  Filled 2017-10-27: qty 2

## 2017-10-27 MED ORDER — SODIUM CHLORIDE 0.9 % WEIGHT BASED INFUSION: 1.0000 mL/kg/h | INTRAVENOUS | Status: DC

## 2017-10-27 MED ORDER — MIDAZOLAM HCL 2 MG/2ML IJ SOLN
INTRAMUSCULAR | Status: DC | PRN
  Administered 2017-10-27: 1 mg via INTRAVENOUS

## 2017-10-27 MED ORDER — SODIUM CHLORIDE 0.9% FLUSH: 3.0000 mL | INTRAVENOUS | Status: DC | PRN

## 2017-10-27 MED ORDER — HEPARIN (PORCINE) IN NACL 2-0.9 UNIT/ML-% IJ SOLN
INTRAMUSCULAR | Status: AC | PRN
  Administered 2017-10-27: 1000 mL

## 2017-10-27 MED ORDER — VERAPAMIL HCL 2.5 MG/ML IV SOLN
INTRAVENOUS | Status: AC
  Filled 2017-10-27: qty 2

## 2017-10-27 MED ORDER — IOPAMIDOL (ISOVUE-370) INJECTION 76%
INTRAVENOUS | Status: AC
  Filled 2017-10-27: qty 50

## 2017-10-27 MED ORDER — LIDOCAINE HCL 1 % IJ SOLN
INTRAMUSCULAR | Status: AC
  Filled 2017-10-27: qty 20

## 2017-10-27 MED ORDER — ONDANSETRON HCL 4 MG/2ML IJ SOLN: 4.0000 mg | Freq: Four times a day (QID) | INTRAMUSCULAR | Status: DC | PRN

## 2017-10-27 MED ORDER — ACETAMINOPHEN 325 MG PO TABS: 650.0000 mg | ORAL_TABLET | ORAL | Status: DC | PRN

## 2017-10-27 MED ORDER — VERAPAMIL HCL 2.5 MG/ML IV SOLN
INTRAVENOUS | Status: DC | PRN
  Administered 2017-10-27: 10 mL via INTRA_ARTERIAL

## 2017-10-27 SURGICAL SUPPLY — 12 items
CATH INFINITI 5FR AL1 (CATHETERS) ×2 IMPLANT
CATH INFINITI 5FR MULTPACK ANG (CATHETERS) ×2 IMPLANT
COVER PRB 48X5XTLSCP FOLD TPE (BAG) ×1 IMPLANT
COVER PROBE 5X48 (BAG) ×1
DEVICE RAD COMP TR BAND LRG (VASCULAR PRODUCTS) ×2 IMPLANT
GLIDESHEATH SLEND A-KIT 6F 22G (SHEATH) ×2 IMPLANT
GUIDEWIRE INQWIRE 1.5J.035X260 (WIRE) ×1 IMPLANT
INQWIRE 1.5J .035X260CM (WIRE) ×2
KIT HEART LEFT (KITS) ×2 IMPLANT
PACK CARDIAC CATHETERIZATION (CUSTOM PROCEDURE TRAY) ×2 IMPLANT
TRANSDUCER W/STOPCOCK (MISCELLANEOUS) ×2 IMPLANT
TUBING CIL FLEX 10 FLL-RA (TUBING) ×2 IMPLANT

## 2017-10-27 NOTE — Interval H&P Note (Signed)
Cath Lab Visit (complete for each Cath Lab visit)  Clinical Evaluation Leading to the Procedure:   ACS: No.  Non-ACS:    Anginal Classification: CCS III  Anti-ischemic medical therapy: Maximal Therapy (2 or more classes of medications)  Non-Invasive Test Results: No non-invasive testing performed  Prior CABG: Previous CABG      History and Physical Interval Note:  10/27/2017 10:40 AM  Jerome Vaughn  has presented today for surgery, with the diagnosis of cp  The various methods of treatment have been discussed with the patient and family. After consideration of risks, benefits and other options for treatment, the patient has consented to  Procedure(s): LEFT HEART CATH AND CORS/GRAFTS ANGIOGRAPHY (N/A) as a surgical intervention .  The patient's history has been reviewed, patient examined, no change in status, stable for surgery.  I have reviewed the patient's chart and labs.  Questions were answered to the patient's satisfaction.     [name] III

## 2017-10-27 NOTE — Telephone Encounter (Signed)
I have corrected & sent.

## 2017-10-27 NOTE — Discharge Instructions (Signed)
NO METFORMIN/GLUCOPHAGE FOR 2 DAYS ° ° ° °Radial Site Care °Refer to this sheet in the next few weeks. These instructions provide you with information about caring for yourself after your procedure. Your health care provider may also give you more specific instructions. Your treatment has been planned according to current medical practices, but problems sometimes occur. Call your health care provider if you have any problems or questions after your procedure. °What can I expect after the procedure? °After your procedure, it is typical to have the following: °· Bruising at the radial site that usually fades within 1-2 weeks. °· Blood collecting in the tissue (hematoma) that may be painful to the touch. It should usually decrease in size and tenderness within 1-2 weeks. ° °Follow these instructions at home: °· Take medicines only as directed by your health care provider. °· You may shower 24-48 hours after the procedure or as directed by your health care provider. Remove the bandage (dressing) and gently wash the site with plain soap and water. Pat the area dry with a clean towel. Do not rub the site, because this may cause bleeding. °· Do not take baths, swim, or use a hot tub until your health care provider approves. °· Check your insertion site every [place] for redness, swelling, or drainage. °· Do not apply powder or lotion to the site. °· Do not flex or bend the affected arm for 24 hours or as directed by your health care provider. °· Do not push or pull heavy objects with the affected arm for 24 hours or as directed by your health care provider. °· Do not lift over 10 lb (4.5 kg) for 5 days after your procedure or as directed by your health care provider. °· Ask your health care provider when it is okay to: °? Return to work or school. °? Resume usual physical activities or sports. °? Resume sexual activity. °· Do not drive home if you are discharged the same [place] as the procedure. Have someone else drive  you. °· You may drive 24 hours after the procedure unless otherwise instructed by your health care provider. °· Do not operate machinery or power tools for 24 hours after the procedure. °· If your procedure was done as an outpatient procedure, which means that you went home the same [place] as your procedure, a responsible adult should be with you for the first 24 hours after you arrive home. °· Keep all follow-up visits as directed by your health care provider. This is important. °Contact a health care provider if: °· You have a fever. °· You have chills. °· You have increased bleeding from the radial site. Hold pressure on the site. °Get help right away if: °· You have unusual pain at the radial site. °· You have redness, warmth, or swelling at the radial site. °· You have drainage (other than a small amount of blood on the dressing) from the radial site. °· The radial site is bleeding, and the bleeding does not stop after 30 minutes of holding steady pressure on the site. °· Your arm or hand becomes pale, cool, tingly, or numb. °This information is not intended to replace advice given to you by your health care provider. Make sure you discuss any questions you have with your health care provider. °Document Released: 10/18/2010 Document Revised: 02/21/2016 Document Reviewed: 04/03/2014 °Elsevier Interactive Patient Education © 2018 Elsevier Inc[employer] °Moderate Conscious Sedation, Adult, Care After °These instructions provide you with information about caring for yourself after your   procedure. Your health care provider may also give you more specific instructions. Your treatment has been planned according to current medical practices, but problems sometimes occur. Call your health care provider if you have any problems or questions after your procedure. °What can I expect after the procedure? °After your procedure, it is common: °· To feel sleepy for several hours. °· To feel clumsy and have poor balance for several  hours. °· To have poor judgment for several hours. °· To vomit if you eat too soon. ° °Follow these instructions at home: °For at least 24 hours after the procedure: ° °· Do not: °? Participate in activities where you could fall or become injured. °? Drive. °? Use heavy machinery. °? Drink alcohol. °? Take sleeping pills or medicines that cause drowsiness. °? Make important decisions or sign legal documents. °? Take care of children on your own. °· Rest. °Eating and drinking °· Follow the diet recommended by your health care provider. °· If you vomit: °? Drink water, juice, or soup when you can drink without vomiting. °? Make sure you have little or no nausea before eating solid foods. °General instructions °· Have a responsible adult stay with you until you are awake and alert. °· Take over-the-counter and prescription medicines only as told by your health care provider. °· If you smoke, do not smoke without supervision. °· Keep all follow-up visits as told by your health care provider. This is important. °Contact a health care provider if: °· You keep feeling nauseous or you keep vomiting. °· You feel light-headed. °· You develop a rash. °· You have a fever. °Get help right away if: °· You have trouble breathing. °This information is not intended to replace advice given to you by your health care provider. Make sure you discuss any questions you have with your health care provider. °Document Released: 07/06/2013 Document Revised: 02/18/2016 Document Reviewed: 01/05/2016 °Elsevier Interactive Patient Education © 2018 Elsevier Inc. ° °

## 2017-10-27 NOTE — Telephone Encounter (Signed)
I have sent.

## 2017-10-27 NOTE — Telephone Encounter (Signed)
Patient stated his medication was sent to the wrong pharmacy and wrong medicine was sent as well.  He need jardiance, send to  [place] Phone: [phone]

## 2017-10-27 NOTE — Telephone Encounter (Signed)
empagliflozin (JARDIANCE) 25 MG TABS tablet   [facility] [place], [place]

## 2017-10-28 ENCOUNTER — Encounter (HOSPITAL_COMMUNITY): Payer: Self-pay | Admitting: Interventional Cardiology

## 2017-10-28 MED FILL — Lidocaine HCl Local Inj 1%: INTRAMUSCULAR | Qty: 20 | Status: AC

## 2017-11-05 ENCOUNTER — Other Ambulatory Visit: Payer: Self-pay | Admitting: Endocrinology

## 2017-11-09 ENCOUNTER — Ambulatory Visit: Payer: 59 | Admitting: Physician Assistant

## 2017-11-09 ENCOUNTER — Encounter: Payer: Self-pay | Admitting: Physician Assistant

## 2017-11-09 VITALS — BP 94/62 | HR 60 | Ht 67.5 in | Wt 189.8 lb

## 2017-11-09 DIAGNOSIS — I10 Essential (primary) hypertension: Secondary | ICD-10-CM

## 2017-11-09 DIAGNOSIS — E7800 Pure hypercholesterolemia, unspecified: Secondary | ICD-10-CM | POA: Diagnosis not present

## 2017-11-09 DIAGNOSIS — E118 Type 2 diabetes mellitus with unspecified complications: Secondary | ICD-10-CM | POA: Diagnosis not present

## 2017-11-09 DIAGNOSIS — I25709 Atherosclerosis of coronary artery bypass graft(s), unspecified, with unspecified angina pectoris: Secondary | ICD-10-CM

## 2017-11-09 DIAGNOSIS — I5022 Chronic systolic (congestive) heart failure: Secondary | ICD-10-CM | POA: Diagnosis not present

## 2017-11-09 MED ORDER — EZETIMIBE 10 MG PO TABS: 10.0000 mg | ORAL_TABLET | Freq: Every day | ORAL | 3 refills | Status: DC

## 2017-11-09 NOTE — Progress Notes (Signed)
Cardiology Office Note    Date:  11/09/2017   ID:  Jerome Vaughn, DOB [birth year], MRN [id number]  PCP:  [name], MD  Cardiologist: Sinclair Grooms, MD  Chief Complaint  Patient presents with  . Follow-up    History of Present Illness:  Jerome Vaughn is a 67 y.o. male with history of CAD status post CABG 11/22/15 with LIMA to the LAD, SVG to the diagonal, SVG to OM1/distal circumflex, SVG to PDA.  Patient has been having chest pain since 6 months after surgery but it has worsened.  2D echo 06/2017 showed worsening LV EF 30-35%.  Had a low risk Myoview 06/2017.  Cardiac catheterization 10/27/17 showed patent LIMA to the LAD, occluded SVG to the first diagonal, occluded distal limb of the SVG to OM1 and OM 2.  OM1 continues to receive adequate blood flow via the graft.  Widely patent SVG to the mid PDA.  Severe diffuse disease proximal to the graft insertion site and in the distal RCA, normal left ventricular function ejection fraction 60%.  Continued aggressive risk factor modification with LDL less than 70 and hemoglobin A1c less than 7.  Patient comes in today for follow-up.  He denies any further chest pain overall he feels well.  Past Medical History:  Diagnosis Date  . Carpal tunnel syndrome    both hands  . Coronary artery disease    a. Cath 11/21/2015: Multivessel CAD --> CABG recommended. b. CABG on 11/22/2015:  LIMA-LAD, SVG-Diag, SVG-OM1 and distal Cx, and SVG-PDA.  . Diabetes mellitus   . History of echocardiogram    a. Echo 4/17: Moderate LVH, EF 50-55%, mild LAE, no pericardial effusion  . Hyperlipidemia   . Hypertension     Past Surgical History:  Procedure Laterality Date  . CARDIAC CATHETERIZATION N/A 11/21/2015   Procedure: Left Heart Cath and Coronary Angiography;  Surgeon: [name], MD;  Location: [facility];  Service: Cardiovascular;  Laterality: N/A;  . colonsocopy  12/07/2009   per Dr. [name], benign polyps, repeat in 10 yrs   . CORONARY  ARTERY BYPASS GRAFT N/A 11/22/2015   Procedure: CORONARY ARTERY BYPASS GRAFTING (CABG) times five using the left internal mammary, right greater saphenous vein EVH, and left thigh greater saphenous vein EVH;  Surgeon: [name], MD;  Location: [facility];  Service: Open Heart Surgery;  Laterality: N/A;  . frozen shoulder release    . LEFT HEART CATH AND CORS/GRAFTS ANGIOGRAPHY N/A 10/27/2017   Procedure: LEFT HEART CATH AND CORS/GRAFTS ANGIOGRAPHY;  Surgeon: [name], MD;  Location: [facility];  Service: Cardiovascular;  Laterality: N/A;  . TEE WITHOUT CARDIOVERSION N/A 11/22/2015   Procedure: TRANSESOPHAGEAL ECHOCARDIOGRAM (TEE);  Surgeon: [name], MD;  Location: [facility];  Service: Open Heart Surgery;  Laterality: N/A;  . WRIST GANGLION EXCISION      Current Medications: Current Meds  Medication Sig  . Ascorbic Acid (VITAMIN C) 100 MG tablet Take 100 mg by mouth daily.  Marland Kitchen aspirin EC 81 MG tablet Take 1 tablet (81 mg total) by mouth daily.  . benazepril (LOTENSIN) 20 MG tablet Take 1 tablet (20 mg total) by mouth daily.  . Blood Glucose Monitoring Suppl ([place]) w/Device KIT USE AS DIRECTED  . bromocriptine (PARLODEL) 2.5 MG tablet TAKE ONE-HALF TABLET BY MOUTH EVERY NIGHT AT BEDTIME  . carvedilol (COREG) 3.125 MG tablet Take 3.125 mg by mouth 2 (two) times daily with  a meal.  . CINNAMON PO Take 2,000 mg by mouth daily.   . Cyanocobalamin (VITAMIN B-12 PO) Take 1 tablet by mouth daily.  . empagliflozin (JARDIANCE) 25 MG TABS tablet Take 25 mg by mouth daily.  Marland Kitchen glucose blood (ONETOUCH VERIO) test strip Use as instructed to check BG once a [place]  . JANUVIA 100 MG tablet TAKE ONE TABLET BY MOUTH DAILY.  Marland Kitchen Lancets (ACCU-CHEK SOFT TOUCH) lancets Dispense for Aviva plus and diagnosis code is E 11.9  . metFORMIN (GLUCOPHAGE-XR) 500 MG 24 hr tablet TAKE 4 TABLETS BY MOUTH ONCE DAILY  . Multiple Vitamin (MULTIVITAMIN) tablet Take 1 tablet by mouth daily.    .  nitroGLYCERIN (NITROSTAT) 0.4 MG SL tablet Place 1 tablet (0.4 mg total) under the tongue every 5 (five) minutes as needed for chest pain.  . Omega-3 Fatty Acids (FISH OIL) 1000 MG CAPS Take 1,000 mg by mouth daily.   . rosuvastatin (CRESTOR) 40 MG tablet TAKE ONE TABLET BY MOUTH DAILY AS DIRECTED FOR CHOLESTEROL     Allergies:   Patient has no known allergies.   Social History   Socioeconomic History  . Marital status: Married    Spouse name: None  . Number of children: 5  . Years of education: None  . Highest education level: None  Social Needs  . Financial resource strain: None  . Food insecurity - worry: None  . Food insecurity - inability: None  . Transportation needs - medical: None  . Transportation needs - non-medical: None  Occupational History  . Occupation: Counsellor  Tobacco Use  . Smoking status: Former Smoker    Packs/Seitzinger: 0.25    Years: 20.00    Pack years: 5.00    Types: Cigarettes    Last attempt to quit: 10/07/2013    Years since quitting: 4.0  . Smokeless tobacco: Never Used  . Tobacco comment: quit cigarettes, might have a cigar 1 X month  Substance and Sexual Activity  . Alcohol use: Yes    Alcohol/week: 1.8 oz    Types: 3 Standard drinks or equivalent per week  . Drug use: No  . Sexual activity: None  Other Topics Concern  . 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   ROS:   Please see the history of present illness.    Review of Systems  Constitution: Negative.  HENT: Negative.   Cardiovascular: Negative.   Respiratory: Negative.   Endocrine: Negative.   Hematologic/Lymphatic:  Negative.   Musculoskeletal: Negative.   Gastrointestinal: Negative.   Genitourinary: Negative.   Neurological: Negative.    All other systems reviewed and are negative.   PHYSICAL EXAM:   VS:  BP 94/62   Pulse 60   Ht 5' 7.5" (1.715 m)   Wt 189 lb 12.8 oz (86.1 kg)   SpO2 97%   BMI 29.29 kg/m   Physical Exam  GEN: Well nourished, well developed, in no acute distress  Neck: no JVD, carotid bruits, or masses Cardiac:RRR; positive S4 Respiratory:  clear to auscultation bilaterally, normal work of breathing GI: soft, nontender, nondistended, + BS Ext: Left arm at  cath site without hematoma or hemorrhage, good radial brachial pulses, lower extremities without cyanosis, clubbing, or edema, Good distal pulses bilaterally Neuro:  Alert and Oriented x 3 Psych: euthymic mood, full affect  Wt Readings from Last 3 Encounters:  11/09/17 189 lb 12.8 oz (86.1 kg)  10/27/17 185 lb (83.9 kg)  10/15/17 188 lb 6.4 oz (85.5 kg)      Studies/Labs Reviewed:   EKG:  EKG is ordered today.  The ekg ordered today demonstrates normal sinus rhythm with PACs nonspecific intraventricular block  Recent Labs: 07/08/2017: ALT 23 10/15/2017: BUN 21; Creatinine, Ser 1.14; Hemoglobin 13.3; Platelets 143; Potassium 5.2; Sodium 138   Lipid Panel    Component Value Date/Time   CHOL 169 07/08/2017 0731   TRIG 129 07/08/2017 0731   HDL 54 07/08/2017 0731   CHOLHDL 3.1 07/08/2017 0731   CHOLHDL 4 05/27/2016 0950   VLDL 24.0 05/27/2016 0950   LDLCALC 89 07/08/2017 0731   LDLDIRECT 204.8 09/05/2013 1559    Additional studies/ records that were reviewed today include:   Cardiac cath 1/29/19Origin to Prox Graft lesion before 1st Mrg is 50% stenosed.    Severe native vessel coronary artery disease with total occlusion of the mid right coronary, total occlusion of the proximal to mid LAD, segmental calcified 90% stenosis in the first diagonal, segmental 80% stenosis in the first obtuse marginal and  diffuse 80% stenosis in the second obtuse marginal.  Patent LIMA to LAD.  Occluded saphenous vein graft to the first diagonal.  Occluded distal limb of the sequential saphenous vein graft to OM1 and OM 2.  OM 1 continues to receive adequate blood flow via the graft.  The proximal to ostial graft contains 50% segmental narrowing  Widely patent SVG to the mid PDA.  Severe diffuse disease proximal to the graft insertion site and in the distal RCA and RCA continuation beyond the PDA bifurcation.  Normal left ventricular function.  EF 60%.  EDP 18 mmHg.   RECOMMENDATIONS:    Continue aggressive risk factor modification: LDL less than 70, hemoglobin A1c less than 7, blood pressure 130/85 mmHg or less, aerobic exercise, beta-blocker therapy, and ACE/ARB therapy.  Nitroglycerin for any prolonged episodes of chest discomfort.   2D echo 10/22/18Study Conclusions   - Left ventricle: The cavity size was normal. There was severe   concentric hypertrophy. Systolic function was moderately to   severely reduced. The estimated ejection fraction was in the   range of 30% to 35%. Diffuse hypokinesis. Features are consistent   with a pseudonormal left ventricular filling pattern, with   concomitant abnormal relaxation and increased filling pressure   (grade 2 diastolic dysfunction). - Aortic valve: Trileaflet; mildly thickened, mildly calcified   leaflets. - Left atrium: The atrium was mildly dilated. - Right ventricle: The cavity size was moderately dilated. Wall   thickness was normal. Systolic function was moderately reduced. - Tricuspid valve: There was mild regurgitation. - Pulmonic valve: There was trivial regurgitation. - Pulmonary arteries: Systolic pressure was within the normal   range. - Inferior vena cava: The vessel was normal in size. - Pericardium, extracardiac: There was no pericardial effusion.   Impressions:   - Since the last study on 01/21/2016 LVEF has decaresed from  50-55%   to 30-35%. RVEF is moderately decreased.       ASSESSMENT:    1. Coronary artery disease involving coronary bypass graft of native heart with angina pectoris ([facility])   2. Essential hypertension   3. Chronic  systolic heart failure ([facility])   4. Pure hypercholesterolemia   5. Type 2 diabetes mellitus with complication, without long-term current use of insulin (H[facility])      PLAN:  In order of problems listed above:  CAD status post previous CABG with most recent cath patent LIMA to the LAD, occluded SVG to the diagonal 1, occluded distal limb SVG to OM1 and OM 2 and widely patent SVG to mid PDA.  Normalization of LV function EF now 60%.  Medical therapy recommended.  Goal for LDL to be less than 70 hemoglobin A1c less than 7.  Cannot titrate carvedilol today because of low blood pressure.  Follow-up with Dr. [name] in 2 months.  Essential hypertension blood pressure well controlled  Chronic systolic CHF resolved with normalization of his ejection fraction now 60%.  Continue lisinopril and carvedilol  Hyperlipidemia patient's LDL was 89 in October.  We will add Zetia 10 mg to his Crestor 40 mg daily.  Check fasting lipid panel and LFTs in 2-3 months.  Diabetes mellitus type 2 patient's sugars are well controlled according to him.  He says his last hemoglobin A1c was 6.9 Medication Adjustments/Labs and Tests Ordered: Current medicines are reviewed at length with the patient today.  Concerns regarding medicines are outlined above.  Medication changes, Labs and Tests ordered today are listed in the Patient Instructions below. Patient Instructions  Medication Instructions:  Your physician has recommended you make the following change in your medication:  1.  START Zetia 10 mg daily  Labwork: 3 MONTHS:  FASTING LIPID & LFT  Testing/Procedures: None ordered  Follow-Up: Your physician recommends that you schedule a follow-up appointment in: 2-3 [place]   Any Other  Special Instructions Will Be Listed Below (If Applicable).     If you need a refill on your cardiac medications before your next appointment, please call your pharmacy.      Sumner Boast, PA-C  11/09/2017 10:59 AM    [facility] [place], [place],   [address] Phone: [phone]; Fax: [phone]

## 2017-11-09 NOTE — Patient Instructions (Addendum)
Medication Instructions:  Your physician has recommended you make the following change in your medication:  1.  START Zetia 10 mg daily  Labwork: 3 MONTHS:  FASTING LIPID & LFT  Testing/Procedures: None ordered  Follow-Up: Your physician recommends that you schedule a follow-up appointment in: 2-3 [place]   Any Other Special Instructions Will Be Listed Below (If Applicable).     If you need a refill on your cardiac medications before your next appointment, please call your pharmacy.

## 2017-11-19 ENCOUNTER — Telehealth: Payer: Self-pay | Admitting: Interventional Cardiology

## 2017-11-19 NOTE — Telephone Encounter (Signed)
NEW MESSAGE    Patient calling for the status of FMLA paperwork that was dropped off last week.    1. Are you calling in reference to your FMLA or disability form? YES  2. What is your question in regards to FMLA or disability form? ARE FORMS COMPLETE AND READY FOR PICK UP   3. Do you need copies of your medical records? N/A  4. Are you waiting on a nurse to call you back with results or are you wanting copies of your results? NO    Please route to Medical Records or your medical records site representative

## 2017-11-19 NOTE — Telephone Encounter (Signed)
Patients FMLA papers were faxed to [place] ( copying service) yesterday 11/18/17 signed & completed.  Sonia Baller was aware patient needed faxed asap. Originals were sent interoffice to [facility].

## 2017-11-20 ENCOUNTER — Ambulatory Visit: Payer: 59 | Admitting: Endocrinology

## 2017-11-23 ENCOUNTER — Ambulatory Visit (INDEPENDENT_AMBULATORY_CARE_PROVIDER_SITE_OTHER): Payer: 59 | Admitting: Endocrinology

## 2017-11-23 ENCOUNTER — Encounter: Payer: Self-pay | Admitting: Endocrinology

## 2017-11-23 VITALS — BP 130/76 | HR 82 | Ht 67.5 in | Wt 192.2 lb

## 2017-11-23 DIAGNOSIS — E118 Type 2 diabetes mellitus with unspecified complications: Secondary | ICD-10-CM

## 2017-11-23 DIAGNOSIS — Z23 Encounter for immunization: Secondary | ICD-10-CM

## 2017-11-23 LAB — POCT GLYCOSYLATED HEMOGLOBIN (HGB A1C): Hemoglobin A1C: 7.1

## 2017-11-23 MED ORDER — BROMOCRIPTINE MESYLATE 2.5 MG PO TABS: 2.5000 mg | ORAL_TABLET | Freq: Every day | ORAL | 3 refills | Status: DC

## 2017-11-23 NOTE — Patient Instructions (Addendum)
Please increase the bromocriptine to 1 whole pill per [place]. Please continue the same other medications. check your blood sugar once a Sweeney.  vary the time of Priestly when you check, between before the 3 meals, and at bedtime.  also check if you have symptoms of your blood sugar being too high or too low.  please keep a record of the readings and bring it to your next appointment here (or you can bring the meter itself).  You can write it on any piece of paper.  please call us sooner if your blood sugar goes below 70, or if you have a lot of readings over 200. Please come back for a follow-up appointment in 3 months

## 2017-11-23 NOTE — Progress Notes (Signed)
   Subjective:    Patient ID: Jerome Vaughn, male    DOB: May 11, 1951, 67 y.o.   MRN: [id number]  HPI Pt returns for f/u of diabetes mellitus:  DM type: 2 Dx'ed: 1324 Complications: CAD and renal insuff  Therapy: 4 oral meds.  DKA: never Severe hypoglycemia: never.  Pancreatitis: never.  Other: he has never been on insulin, but he has learned how; he stopped pioglitizone, due to edema.   Interval history: pt states he feels well in general.  He takes meds as rx'ed.     Review of Systems He denies hypoglycemia.      Objective:   Physical Exam VITAL SIGNS:  See vs page GENERAL: no distress Pulses: dorsalis pedis intact bilat.   MSK: no deformity of the feet CV: no leg edema Skin:  no ulcer on the feet.  normal color and temp on the feet. Neuro: sensation is intact to touch on the feet  Lab Results  Component Value Date   HGBA1C 7.1 11/23/2017   Lab Results  Component Value Date   CREATININE 1.14 10/15/2017   BUN 21 10/15/2017   NA 138 10/15/2017   K 5.2 10/15/2017   CL 99 10/15/2017   CO2 20 10/15/2017        Assessment & Plan:  Type 2 DM, with CAD: she needs increased rx   Patient Instructions  Please increase the bromocriptine to 1 whole pill per Candee. Please continue the same other medications. check your blood sugar once a Fontes.  vary the time of Bartell when you check, between before the 3 meals, and at bedtime.  also check if you have symptoms of your blood sugar being too high or too low.  please keep a record of the readings and bring it to your next appointment here (or you can bring the meter itself).  You can write it on any piece of paper.  please call us sooner if your blood sugar goes below 70, or if you have a lot of readings over 200. Please come back for a follow-up appointment in 3 months

## 2017-11-25 ENCOUNTER — Ambulatory Visit: Payer: 59 | Admitting: Endocrinology

## 2017-12-16 ENCOUNTER — Ambulatory Visit: Payer: 59 | Admitting: Interventional Cardiology

## 2018-01-18 ENCOUNTER — Other Ambulatory Visit: Payer: Self-pay | Admitting: Endocrinology

## 2018-01-19 ENCOUNTER — Other Ambulatory Visit: Payer: 59 | Admitting: *Deleted

## 2018-01-19 ENCOUNTER — Telehealth: Payer: Self-pay

## 2018-01-19 DIAGNOSIS — I25709 Atherosclerosis of coronary artery bypass graft(s), unspecified, with unspecified angina pectoris: Secondary | ICD-10-CM

## 2018-01-19 DIAGNOSIS — I10 Essential (primary) hypertension: Secondary | ICD-10-CM

## 2018-01-19 DIAGNOSIS — I5022 Chronic systolic (congestive) heart failure: Secondary | ICD-10-CM

## 2018-01-19 LAB — HEPATIC FUNCTION PANEL
ALT: 31 IU/L (ref 0–44)
AST: 24 IU/L (ref 0–40)
Albumin: 4.5 g/dL (ref 3.6–4.8)
Alkaline Phosphatase: 30 IU/L — ABNORMAL LOW (ref 39–117)
BILIRUBIN TOTAL: 0.2 mg/dL (ref 0.0–1.2)
BILIRUBIN, DIRECT: 0.1 mg/dL (ref 0.00–0.40)
TOTAL PROTEIN: 7.8 g/dL (ref 6.0–8.5)

## 2018-01-19 LAB — LIPID PANEL
CHOLESTEROL TOTAL: 128 mg/dL (ref 100–199)
Chol/HDL Ratio: 2.3 ratio (ref 0.0–5.0)
HDL: 56 mg/dL (ref >39)
LDL CALC: 59 mg/dL (ref 0–99)
TRIGLYCERIDES: 67 mg/dL (ref 0–149)
VLDL Cholesterol Cal: 13 mg/dL (ref 5–40)

## 2018-01-19 NOTE — Telephone Encounter (Signed)
I also LVM to patient that Dr. Loanne Drilling stated that it was ok to receive shot, but to keep a check on BS & let us know how they are doing.

## 2018-01-19 NOTE — Telephone Encounter (Signed)
I spoke with PA from Dr. Berenice Primas & they wanted to give patient a Cortizone shot in his knee for pain. He was concerned that this would cause his blood sugars to elevate. I stated that you were out of the office & I could message you in regards to this. He is not giving the shot until he hears back from [place]. Please advise?

## 2018-01-19 NOTE — Telephone Encounter (Signed)
Ok to give shot.  Please call 2 days later to report cbg's.

## 2018-01-19 NOTE — Telephone Encounter (Signed)
I called and [place] they have sent a note back to PA that it was ok to give cortisone shot.I ask that their office just notify us when patient is receiving shot.

## 2018-01-25 ENCOUNTER — Other Ambulatory Visit: Payer: Self-pay | Admitting: Endocrinology

## 2018-02-03 ENCOUNTER — Other Ambulatory Visit: Payer: 59

## 2018-02-10 ENCOUNTER — Ambulatory Visit: Payer: 59 | Admitting: Interventional Cardiology

## 2018-02-19 ENCOUNTER — Ambulatory Visit (INDEPENDENT_AMBULATORY_CARE_PROVIDER_SITE_OTHER): Payer: 59 | Admitting: Endocrinology

## 2018-02-19 ENCOUNTER — Encounter: Payer: Self-pay | Admitting: Endocrinology

## 2018-02-19 VITALS — BP 100/68 | HR 73 | Wt 186.2 lb

## 2018-02-19 DIAGNOSIS — E118 Type 2 diabetes mellitus with unspecified complications: Secondary | ICD-10-CM

## 2018-02-19 LAB — POCT GLYCOSYLATED HEMOGLOBIN (HGB A1C): Hemoglobin A1C: 7 % — AB (ref 4.0–5.6)

## 2018-02-19 MED ORDER — BROMOCRIPTINE MESYLATE 5 MG PO CAPS: 5.0000 mg | ORAL_CAPSULE | Freq: Every day | ORAL | 3 refills | Status: DC

## 2018-02-19 NOTE — Progress Notes (Signed)
Subjective:    Patient ID: Jerome Vaughn, male    DOB: 1951/06/02, 67 y.o.   MRN: [id number]  HPI Pt returns for f/u of diabetes mellitus:  DM type: 2 Dx'ed: 6606 Complications: CAD and renal insuff.   Therapy: 4 oral meds.  DKA: never Severe hypoglycemia: never.  Pancreatitis: never.  Other: he has never been on insulin, but he has learned how; he stopped pioglitizone, due to edema.   Interval history: pt states he feels well in general.  He takes meds as rx'ed. Past Medical History:  Diagnosis Date  . Carpal tunnel syndrome    both hands  . Coronary artery disease    a. Cath 11/21/2015: Multivessel CAD --> CABG recommended. b. CABG on 11/22/2015:  LIMA-LAD, SVG-Diag, SVG-OM1 and distal Cx, and SVG-PDA.  . Diabetes mellitus   . History of echocardiogram    a. Echo 4/17: Moderate LVH, EF 50-55%, mild LAE, no pericardial effusion  . Hyperlipidemia   . Hypertension     Past Surgical History:  Procedure Laterality Date  . CARDIAC CATHETERIZATION N/A 11/21/2015   Procedure: Left Heart Cath and Coronary Angiography;  Surgeon: [name], MD;  Location: [facility];  Service: Cardiovascular;  Laterality: N/A;  . colonsocopy  12/07/2009   per Dr. [name], benign polyps, repeat in 10 yrs   . CORONARY ARTERY BYPASS GRAFT N/A 11/22/2015   Procedure: CORONARY ARTERY BYPASS GRAFTING (CABG) times five using the left internal mammary, right greater saphenous vein EVH, and left thigh greater saphenous vein EVH;  Surgeon: [name], MD;  Location: [facility];  Service: Open Heart Surgery;  Laterality: N/A;  . frozen shoulder release    . LEFT HEART CATH AND CORS/GRAFTS ANGIOGRAPHY N/A 10/27/2017   Procedure: LEFT HEART CATH AND CORS/GRAFTS ANGIOGRAPHY;  Surgeon: [name], MD;  Location: [facility];  Service: Cardiovascular;  Laterality: N/A;  . TEE WITHOUT CARDIOVERSION N/A 11/22/2015   Procedure: TRANSESOPHAGEAL ECHOCARDIOGRAM (TEE);  Surgeon: [name], MD;   Location: [facility];  Service: Open Heart Surgery;  Laterality: N/A;  . WRIST GANGLION EXCISION      Social History   Socioeconomic History  . Marital status: Married    Spouse name: Not on file  . Number of children: 5  . Years of education: Not on file  . Highest education level: Not on file  Occupational History  . Occupation: Counsellor  Social Needs  . Financial resource strain: Not on file  . Food insecurity:    Worry: Not on file    Inability: Not on file  . Transportation needs:    Medical: Not on file    Non-medical: Not on file  Tobacco Use  . Smoking status: Former Smoker    Packs/Maya: 0.25    Years: 20.00    Pack years: 5.00    Types: Cigarettes    Last attempt to quit: 10/07/2013    Years since quitting: 4.3  . Smokeless tobacco: Never Used  . Tobacco comment: quit cigarettes, might have a cigar 1 X month  Substance and Sexual Activity  . Alcohol use: Yes    Alcohol/week: 1.8 oz    Types: 3 Standard drinks or equivalent per week  . Drug use: No  . Sexual activity: Not on file  Lifestyle  . Physical activity:    Days per week: Not on file    Minutes per session: Not on file  . Stress: Not on file  Relationships  .  Social connections:    Talks on phone: Not on file    Gets together: Not on file    Attends religious service: Not on file    Active member of club or organization: Not on file    Attends meetings of clubs or organizations: Not on file    Relationship status: Not on file  . Intimate partner violence:    Fear of current or ex partner: Not on file    Emotionally abused: Not on file    Physically abused: Not on file    Forced sexual activity: Not on file  Other Topics Concern  . 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 Ascorbic Acid (VITAMIN C) 100 MG tablet Take 100 mg by mouth daily.    . aspirin EC 81 MG tablet Take 1 tablet (81 mg total) by mouth daily.    . benazepril (LOTENSIN) 20 MG tablet Take 1 tablet (20 mg total) by mouth daily. 90 tablet 3  . Blood Glucose Monitoring Suppl (ONETOUCH VERIO FLEX SYSTEM) w/Device KIT USE AS DIRECTED 1 kit 0  . carvedilol (COREG) 3.125 MG tablet Take 3.125 mg by mouth 2 (two) times daily with a meal.    . CINNAMON PO Take 2,000 mg by mouth daily.     . Cyanocobalamin (VITAMIN B-12 PO) Take 1 tablet by mouth daily.    . empagliflozin (JARDIANCE) 25 MG TABS tablet Take 25 mg by mouth daily. 90 tablet 2  . ezetimibe (ZETIA) 10 MG tablet Take 1 tablet (10 mg total) by mouth daily. 90 tablet 3  . glucose blood (ONETOUCH VERIO) test strip Use as instructed to check BG once a Kreh 100 each 12  . JANUVIA 100 MG tablet TAKE ONE TABLET BY MOUTH DAILY. 90 tablet 0  . Lancets (ACCU-CHEK SOFT TOUCH) lancets Dispense for Aviva plus and diagnosis code is E 11.9 100 each 0  . metFORMIN (GLUCOPHAGE-XR) 500 MG 24 hr tablet TAKE 4 TABLETS BY MOUTH ONCE DAILY 360 tablet 0  . Multiple Vitamin (MULTIVITAMIN) tablet Take 1 tablet by mouth daily.      . nitroGLYCERIN (NITROSTAT) 0.4 MG SL tablet Place 1 tablet (0.4 mg total) under the tongue every 5 (five) minutes as needed for chest pain. 25 tablet 5  . Omega-3 Fatty Acids (FISH OIL) 1000 MG CAPS Take 1,000 mg by mouth daily.     . rosuvastatin (CRESTOR) 40 MG tablet TAKE ONE TABLET BY MOUTH DAILY AS DIRECTED FOR CHOLESTEROL 90 tablet 3   No current facility-administered medications on file prior to visit.     No Known Allergies  Family History  Problem Relation Age of Onset  . Anuerysm Brother   . Hyperlipidemia Sister   . Diabetes Sister   . Hypertension Sister   . Diabetes Maternal Grandmother   . Hyperlipidemia Sister   . Kidney disease Sister   . Heart attack Neg Hx     BP 100/68   Pulse 73   Wt 186 lb 3.2 oz  (84.5 kg)   SpO2 96%   BMI 28.73 kg/m    Review of Systems He denies hypoglycemia    Objective:   Physical Exam VITAL SIGNS:  See vs page GENERAL: no   distress Pulses: foot pulses are intact bilaterally.   MSK: no deformity of the feet or ankles.  CV: no edema of the legs or ankles Skin:  no ulcer on the feet or ankles.  normal color and temp on the feet and ankles.   Neuro: sensation is intact to touch on the feet and ankles.      Lab Results  Component Value Date   CREATININE 1.14 10/15/2017   BUN 21 10/15/2017   NA 138 10/15/2017   K 5.2 10/15/2017   CL 99 10/15/2017   CO2 20 10/15/2017   Lab Results  Component Value Date   HGBA1C 7.0 (A) 02/19/2018      Assessment & Plan:  Type 2 DM, with CAD: he needs increased rx Renal insuff: this limits rx options Edema: this limits rx options  Patient Instructions  Please increase the bromocriptine to 5 mg per Tietje. Please continue the same other diabetes medications. check your blood sugar once a Juday.  vary the time of Echevarria when you check, between before the 3 meals, and at bedtime.  also check if you have symptoms of your blood sugar being too high or too low.  please keep a record of the readings and bring it to your next appointment here (or you can bring the meter itself).  You can write it on any piece of paper.  please call us sooner if your blood sugar goes below 70, or if you have a lot of readings over 200. Please come back for a follow-up appointment in 4-5 months    

## 2018-02-19 NOTE — Patient Instructions (Addendum)
Please increase the bromocriptine to 5 mg per Mah[place]. Please continue the same other diabetes medications. check your blood sugar once a Raczka.  vary the time of Brun when you check, between before the 3 meals, and at bedtime.  also check if you have symptoms of your blood sugar being too high or too low.  please keep a record of the readings and bring it to your next appointment here (or you can bring the meter itself).  You can write it on any piece of paper.  please call us sooner if your blood sugar goes below 70, or if you have a lot of readings over 200. Please come back for a follow-up appointment in 4-5 months

## 2018-03-22 ENCOUNTER — Telehealth: Payer: Self-pay | Admitting: Family Medicine

## 2018-03-22 MED ORDER — ROSUVASTATIN CALCIUM 40 MG PO TABS: ORAL_TABLET | ORAL | 0 refills | Status: DC

## 2018-03-22 NOTE — Telephone Encounter (Signed)
Copied from [id number]. Topic: Quick Communication - Rx Refill/Question >> Mar 22, 2018 12:19 PM Neva Seat wrote: rosuvastatin (CRESTOR) 40 MG tablet  Needs the refills called into:  [facility]    [address] [place],   [place], [place] [address]    [phone]

## 2018-03-31 ENCOUNTER — Other Ambulatory Visit: Payer: Self-pay

## 2018-03-31 ENCOUNTER — Telehealth: Payer: Self-pay | Admitting: Endocrinology

## 2018-03-31 MED ORDER — EMPAGLIFLOZIN 25 MG PO TABS: 25.0000 mg | ORAL_TABLET | Freq: Every day | ORAL | 2 refills | Status: DC

## 2018-03-31 MED ORDER — GLUCOSE BLOOD VI STRP: ORAL_STRIP | 12 refills | Status: DC

## 2018-03-31 MED ORDER — ACCU-CHEK SOFT TOUCH LANCETS MISC: 0 refills | Status: DC

## 2018-03-31 MED ORDER — SITAGLIPTIN PHOSPHATE 100 MG PO TABS: 100.0000 mg | ORAL_TABLET | Freq: Every day | ORAL | 0 refills | Status: DC

## 2018-03-31 MED ORDER — METFORMIN HCL ER 500 MG PO TB24: ORAL_TABLET | ORAL | 0 refills | Status: DC

## 2018-03-31 MED ORDER — BROMOCRIPTINE MESYLATE 5 MG PO CAPS: 5.0000 mg | ORAL_CAPSULE | Freq: Every day | ORAL | 3 refills | Status: DC

## 2018-03-31 NOTE — Telephone Encounter (Signed)
I have sent all prescriptions that Dr. Loanne Drilling prescribed to requested pharmacy.

## 2018-03-31 NOTE — Telephone Encounter (Signed)
Patient want all prescription send to [facility], [place], [place] [address] Phone: [phone]

## 2018-04-02 ENCOUNTER — Other Ambulatory Visit: Payer: Self-pay | Admitting: Endocrinology

## 2018-04-08 ENCOUNTER — Ambulatory Visit (INDEPENDENT_AMBULATORY_CARE_PROVIDER_SITE_OTHER): Payer: Managed Care, Other (non HMO) | Admitting: Family Medicine

## 2018-04-08 ENCOUNTER — Encounter: Payer: Self-pay | Admitting: Family Medicine

## 2018-04-08 VITALS — BP 102/66 | HR 80 | Temp 98.0°F | Ht 67.5 in | Wt 179.2 lb

## 2018-04-08 DIAGNOSIS — N521 Erectile dysfunction due to diseases classified elsewhere: Secondary | ICD-10-CM

## 2018-04-08 DIAGNOSIS — Z0000 Encounter for general adult medical examination without abnormal findings: Secondary | ICD-10-CM | POA: Diagnosis not present

## 2018-04-08 DIAGNOSIS — Z209 Contact with and (suspected) exposure to unspecified communicable disease: Secondary | ICD-10-CM

## 2018-04-08 LAB — HEPATIC FUNCTION PANEL
ALT: 48 U/L (ref 0–53)
AST: 24 U/L (ref 0–37)
Albumin: 4.4 g/dL (ref 3.5–5.2)
Alkaline Phosphatase: 27 U/L — ABNORMAL LOW (ref 39–117)
BILIRUBIN DIRECT: 0.1 mg/dL (ref 0.0–0.3)
BILIRUBIN TOTAL: 0.4 mg/dL (ref 0.2–1.2)
Total Protein: 7.7 g/dL (ref 6.0–8.3)

## 2018-04-08 LAB — CBC WITH DIFFERENTIAL/PLATELET
Basophils Absolute: 0 10*3/uL (ref 0.0–0.1)
Basophils Relative: 0.8 % (ref 0.0–3.0)
EOS ABS: 0.1 10*3/uL (ref 0.0–0.7)
Eosinophils Relative: 1.6 % (ref 0.0–5.0)
HCT: 41.7 % (ref 39.0–52.0)
HEMOGLOBIN: 14.2 g/dL (ref 13.0–17.0)
Lymphocytes Relative: 41.7 % (ref 12.0–46.0)
Lymphs Abs: 1.7 10*3/uL (ref 0.7–4.0)
MCHC: 34.1 g/dL (ref 30.0–36.0)
MCV: 89.3 fl (ref 78.0–100.0)
MONO ABS: 0.5 10*3/uL (ref 0.1–1.0)
Monocytes Relative: 11.8 % (ref 3.0–12.0)
NEUTROS PCT: 44.1 % (ref 43.0–77.0)
Neutro Abs: 1.8 10*3/uL (ref 1.4–7.7)
Platelets: 136 10*3/uL — ABNORMAL LOW (ref 150.0–400.0)
RBC: 4.67 Mil/uL (ref 4.22–5.81)
RDW: 13.7 % (ref 11.5–15.5)
WBC: 4 10*3/uL (ref 4.0–10.5)

## 2018-04-08 LAB — LIPID PANEL
CHOL/HDL RATIO: 3
Cholesterol: 154 mg/dL (ref 0–200)
HDL: 52.7 mg/dL (ref >39.00)
LDL CALC: 72 mg/dL (ref 0–99)
NONHDL: 101.16
Triglycerides: 147 mg/dL (ref 0.0–149.0)
VLDL: 29.4 mg/dL (ref 0.0–40.0)

## 2018-04-08 LAB — TSH: TSH: 1.07 u[IU]/mL (ref 0.35–4.50)

## 2018-04-08 LAB — POC URINALSYSI DIPSTICK (AUTOMATED)
Bilirubin, UA: NEGATIVE
Blood, UA: NEGATIVE
Glucose, UA: POSITIVE — AB
Ketones, UA: NEGATIVE
LEUKOCYTES UA: NEGATIVE
Nitrite, UA: NEGATIVE
PROTEIN UA: NEGATIVE
SPEC GRAV UA: 1.02 (ref 1.010–1.025)
UROBILINOGEN UA: 0.2 U/dL
pH, UA: 6 (ref 5.0–8.0)

## 2018-04-08 LAB — BASIC METABOLIC PANEL
BUN: 20 mg/dL (ref 6–23)
CALCIUM: 9.9 mg/dL (ref 8.4–10.5)
CO2: 24 meq/L (ref 19–32)
CREATININE: 1.14 mg/dL (ref 0.40–1.50)
Chloride: 103 mEq/L (ref 96–112)
GFR: 82.99 mL/min (ref >60.00)
Glucose, Bld: 122 mg/dL — ABNORMAL HIGH (ref 70–99)
Potassium: 4.4 mEq/L (ref 3.5–5.1)
Sodium: 137 mEq/L (ref 135–145)

## 2018-04-08 LAB — PSA: PSA: 0.41 ng/mL (ref 0.10–4.00)

## 2018-04-08 MED ORDER — ROSUVASTATIN CALCIUM 40 MG PO TABS: ORAL_TABLET | ORAL | 3 refills | Status: DC

## 2018-04-08 MED ORDER — TADALAFIL 20 MG PO TABS: 20.0000 mg | ORAL_TABLET | Freq: Every day | ORAL | 3 refills | Status: DC | PRN

## 2018-04-08 NOTE — Progress Notes (Signed)
   Subjective:    Patient ID: Jerome Vaughn, male    DOB: [birth year], 67 y.o.   MRN: [id number]  HPI Here for a well exam. He is doing well in general. He sees Dr. Loanne Drilling for his diabetes and Dr. [name] for his heart disease. Currently he has been dealing with some low back pain. He has been seeing Sports Medicine at [facility] and he is using Prednisone and Methocarbamol.    Review of Systems  Constitutional: Negative.   HENT: Negative.   Eyes: Negative.   Respiratory: Negative.   Cardiovascular: Negative.   Gastrointestinal: Negative.   Genitourinary: Negative.   Musculoskeletal: Positive for back pain.  Skin: Negative.   Neurological: Negative.   Psychiatric/Behavioral: Negative.        Objective:   Physical Exam  Constitutional: He is oriented to person, place, and time. He appears well-developed and well-nourished. No distress.  HENT:  Head: Normocephalic and atraumatic.  Right Ear: External ear normal.  Left Ear: External ear normal.  Nose: Nose normal.  Mouth/Throat: Oropharynx is clear and moist. No oropharyngeal exudate.  Eyes: Pupils are equal, round, and reactive to light. Conjunctivae and EOM are normal. Right eye exhibits no discharge. Left eye exhibits no discharge. No scleral icterus.  Neck: Neck supple. No JVD present. No tracheal deviation present. No thyromegaly present.  Cardiovascular: Normal rate, regular rhythm, normal heart sounds and intact distal pulses. Exam reveals no gallop and no friction rub.  No murmur heard. Pulmonary/Chest: Effort normal and breath sounds normal. No respiratory distress. He has no wheezes. He has no rales. He exhibits no tenderness.  Abdominal: Soft. Bowel sounds are normal. He exhibits no distension and no mass. There is no tenderness. There is no rebound and no guarding.  Genitourinary: Rectum normal, prostate normal and penis normal. Rectal exam shows guaiac negative stool. No penile tenderness.  Musculoskeletal:  Normal range of motion. He exhibits no edema or tenderness.  Lymphadenopathy:    He has no cervical adenopathy.  Neurological: He is alert and oriented to person, place, and time. He has normal reflexes. He displays normal reflexes. No cranial nerve deficit. He exhibits normal muscle tone. Coordination normal.  Skin: Skin is warm and dry. No rash noted. He is not diaphoretic. No erythema. No pallor.  Psychiatric: He has a normal mood and affect. His behavior is normal. Judgment and thought content normal.          Assessment & Plan:  Well exam. We discussed diet and exercise. Get fasting labs.  Alysia Penna, MD

## 2018-04-09 ENCOUNTER — Other Ambulatory Visit: Payer: Self-pay | Admitting: Endocrinology

## 2018-04-09 LAB — HEPATITIS C ANTIBODY
Hepatitis C Ab: NONREACTIVE
SIGNAL TO CUT-OFF: 0.01 (ref <1.00)

## 2018-04-22 ENCOUNTER — Other Ambulatory Visit: Payer: Self-pay

## 2018-04-22 ENCOUNTER — Telehealth: Payer: Self-pay | Admitting: Endocrinology

## 2018-04-22 MED ORDER — METFORMIN HCL ER 500 MG PO TB24: ORAL_TABLET | ORAL | 0 refills | Status: DC

## 2018-04-22 MED ORDER — GLUCOSE BLOOD VI STRP
ORAL_STRIP | 12 refills | Status: DC
Start: 2018-04-22 — End: 2019-03-08

## 2018-04-22 MED ORDER — SITAGLIPTIN PHOSPHATE 100 MG PO TABS: 100.0000 mg | ORAL_TABLET | Freq: Every day | ORAL | 0 refills | Status: DC

## 2018-04-22 NOTE — Telephone Encounter (Signed)
I have sent for patient.  

## 2018-04-22 NOTE — Telephone Encounter (Signed)
Patient requests all medications be sent as [address] supply to [facility] on [address].

## 2018-05-17 ENCOUNTER — Other Ambulatory Visit: Payer: Self-pay | Admitting: *Deleted

## 2018-05-17 MED ORDER — BENAZEPRIL HCL 20 MG PO TABS: 20.0000 mg | ORAL_TABLET | Freq: Every day | ORAL | 1 refills | Status: DC

## 2018-05-26 ENCOUNTER — Other Ambulatory Visit: Payer: Self-pay

## 2018-05-26 MED ORDER — SITAGLIPTIN PHOSPHATE 100 MG PO TABS: 100.0000 mg | ORAL_TABLET | Freq: Every day | ORAL | 1 refills | Status: DC

## 2018-06-09 NOTE — Progress Notes (Signed)
Cardiology Office Note:    Date:  06/10/2018   ID:  Jerome Vaughn, DOB 15-Jan-1951, MRN [id number]  PCP:  [name], MD  Cardiologist:  Sinclair Grooms, MD   Referring MD: [name], MD   Chief Complaint  Patient presents with  . Atrial Fibrillation    History of Present Illness:    Jerome Vaughn is a 67 y.o. 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 He was referred to [facility]. He was seen by Dr. Prescott Gum and underwent CABG with LIMA-LAD, SVG-diagonal, SVG-OM1/distal LCx, SVG-PDA on 11/22/15.After bypass surgery he was readmitted to the hospital after developing chest discomfort. Recently an LV assessment by echo demonstrated EF 30-35%.  Starting approximately 6 months after surgery he has begun complaining of episodes of chest pain.  Doing well at work.  Very active requiring multiple trips up and down 4 flights of stairs per [place] without angina.  Medication adjustment after coronary angiography earlier this year is led to complete resolution of anginal symptoms.  No medication side effects.  He has not had any of his medications yet today.    Past Medical History:  Diagnosis Date  . Carpal tunnel syndrome    both hands  . Coronary artery disease    a. Cath 11/21/2015: Multivessel CAD --> CABG recommended. b. CABG on 11/22/2015:  LIMA-LAD, SVG-Diag, SVG-OM1 and distal Cx, and SVG-PDA.  . Diabetes mellitus   . History of echocardiogram    a. Echo 4/17: Moderate LVH, EF 50-55%, mild LAE, no pericardial effusion  . Hyperlipidemia   . Hypertension     Past Surgical History:  Procedure Laterality Date  . CARDIAC CATHETERIZATION N/A 11/21/2015   Procedure: Left Heart Cath and Coronary Angiography;  Surgeon: [name], MD;  Location: [facility];  Service: Cardiovascular;  Laterality: N/A;  . colonsocopy  12/07/2009   per Dr. [name], benign polyps, repeat in 10 yrs   . CORONARY ARTERY BYPASS GRAFT N/A 11/22/2015   Procedure: CORONARY ARTERY  BYPASS GRAFTING (CABG) times five using the left internal mammary, right greater saphenous vein EVH, and left thigh greater saphenous vein EVH;  Surgeon: [name], MD;  Location: [facility];  Service: Open Heart Surgery;  Laterality: N/A;  . frozen shoulder release    . LEFT HEART CATH AND CORS/GRAFTS ANGIOGRAPHY N/A 10/27/2017   Procedure: LEFT HEART CATH AND CORS/GRAFTS ANGIOGRAPHY;  Surgeon: [name], MD;  Location: [facility];  Service: Cardiovascular;  Laterality: N/A;  . TEE WITHOUT CARDIOVERSION N/A 11/22/2015   Procedure: TRANSESOPHAGEAL ECHOCARDIOGRAM (TEE);  Surgeon: [name], MD;  Location: [facility];  Service: Open Heart Surgery;  Laterality: N/A;  . WRIST GANGLION EXCISION      Current Medications: Current Meds  Medication Sig  . Ascorbic Acid (VITAMIN C) 100 MG tablet Take 100 mg by mouth daily.  Marland Kitchen aspirin EC 81 MG tablet Take 1 tablet (81 mg total) by mouth daily.  . benazepril (LOTENSIN) 20 MG tablet Take 1 tablet (20 mg total) by mouth daily.  . Blood Glucose Monitoring Suppl ([place]) w/Device KIT USE AS DIRECTED  . bromocriptine (PARLODEL) 5 MG capsule Take 1 capsule (5 mg total) by mouth daily.  . carvedilol (COREG) 3.125 MG tablet Take 3.125 mg by mouth 2 (two) times daily with a meal.  . CINNAMON PO Take 2,000 mg by mouth daily.   . Cyanocobalamin (VITAMIN B-12  PO) Take 1 tablet by mouth daily.  . empagliflozin (JARDIANCE) 25 MG TABS tablet Take 25 mg by mouth daily.  Marland Kitchen ezetimibe (ZETIA) 10 MG tablet Take 10 mg by mouth daily.  . ferrous sulfate 325 (65 FE) MG tablet Take 325 mg by mouth daily with breakfast.  . glucose blood (ONETOUCH VERIO) test strip Use as instructed to check BG once a Alonso  . Lancets (ACCU-CHEK SOFT TOUCH) lancets Dispense for Aviva plus and diagnosis code is E 11.9  . metFORMIN (GLUCOPHAGE-XR) 500 MG 24 hr tablet TAKE 4 TABLETS BY MOUTH ONCE DAILY  . methocarbamol (ROBAXIN) 500 MG tablet Take 500 mg by mouth  every 8 (eight) hours as needed for muscle spasms.  . Multiple Vitamin (MULTIVITAMIN) tablet Take 1 tablet by mouth daily.    . nitroGLYCERIN (NITROSTAT) 0.4 MG SL tablet Place 1 tablet (0.4 mg total) under the tongue every 5 (five) minutes as needed for chest pain.  . Omega-3 Fatty Acids (FISH OIL) 1000 MG CAPS Take 1,000 mg by mouth daily.   . rosuvastatin (CRESTOR) 40 MG tablet TAKE ONE TABLET BY MOUTH DAILY AS DIRECTED FOR CHOLESTEROL  . sitaGLIPtin (JANUVIA) 100 MG tablet Take 1 tablet (100 mg total) by mouth daily.  . tadalafil (CIALIS) 20 MG tablet Take 1 tablet (20 mg total) by mouth daily as needed for erectile dysfunction.     Allergies:   Patient has no known allergies.   Social History   Socioeconomic History  . Marital status: Married    Spouse name: Not on file  . Number of children: 5  . Years of education: Not on file  . Highest education level: Not on file  Occupational History  . Occupation: Counsellor  Social Needs  . Financial resource strain: Not on file  . Food insecurity:    Worry: Not on file    Inability: Not on file  . Transportation needs:    Medical: Not on file    Non-medical: Not on file  Tobacco Use  . Smoking status: Former Smoker    Packs/Rumple: 0.25    Years: 20.00    Pack years: 5.00    Types: Cigarettes    Last attempt to quit: 10/07/2013    Years since quitting: 4.6  . Smokeless tobacco: Never Used  . Tobacco comment: quit cigarettes, might have a cigar 1 X month  Substance and Sexual Activity  . Alcohol use: Yes    Alcohol/week: 3.0 standard drinks    Types: 3 Standard drinks or equivalent per week  . Drug use: No  . Sexual activity: Not on file  Lifestyle  . Physical activity:    Days per week: Not on file    Minutes per session: Not on file  . Stress: Not on file  Relationships  . Social connections:    Talks on phone: Not on file    Gets together: Not on file    Attends religious service: Not on file    Active member of club  or organization: Not on file    Attends meetings of clubs or organizations: Not on file    Relationship status: Not on file  Other Topics Concern  . 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 There is no history of Heart attack.  ROS:   Please see the history of present illness.    Having difficulty with plantar fasciitis in the right foot.  Otherwise no complaints.  All other systems reviewed and are negative.  EKGs/Labs/Other Studies Reviewed:    The following studies were reviewed today: No new data  EKG:  EKG is not ordered today.    Recent Labs: 04/08/2018: ALT 48; BUN 20; Creatinine, Ser 1.14; Hemoglobin 14.2; Platelets 136.0; Potassium 4.4; Sodium 137; TSH 1.07  Recent Lipid Panel    Component Value Date/Time   CHOL 154 04/08/2018 0851   CHOL 128 01/19/2018 0907   TRIG 147.0 04/08/2018 0851   HDL 52.70 04/08/2018 0851   HDL 56 01/19/2018 0907   CHOLHDL 3 04/08/2018 0851   VLDL 29.4 04/08/2018 0851   LDLCALC 72 04/08/2018 0851   LDLCALC 59 01/19/2018 0907   LDLDIRECT 204.8 09/05/2013 1559    Physical Exam:    VS:  BP 136/90   Pulse 72   Ht '5\' 7"'  (1.702 m)   Wt 181 lb 6.4 oz (82.3 kg)   BMI 28.41 kg/m     Wt Readings from Last 3 Encounters:  06/10/18 181 lb 6.4 oz (82.3 kg)  04/08/18 179 lb 3.2 oz (81.3 kg)  02/19/18 186 lb 3.2 oz (84.5 kg)     GEN:  Well nourished, well developed in no acute distress HEENT: Normal NECK: No JVD. LYMPHATICS: No lymphadenopathy CARDIAC: RRR, no murmur, no gallop, no edema. VASCULAR: 2+ bilateral radial pulses.  No bruits. RESPIRATORY:  Clear to  auscultation without rales, wheezing or rhonchi  ABDOMEN: Soft, non-tender, non-distended, No pulsatile mass, MUSCULOSKELETAL: No deformity  SKIN: Warm and dry NEUROLOGIC:  Alert and oriented x 3 PSYCHIATRIC:  Normal affect   ASSESSMENT:    1. S/P CABG x 5   2. Type 2 diabetes mellitus with complication, without long-term current use of insulin ([place])   3. Essential hypertension   4. Pure hypercholesterolemia   5. Coronary artery disease involving coronary bypass graft of native heart with angina pectoris ([place])   6. Chronic systolic heart failure (HCC)    PLAN:    In order of problems listed above:  1. Doing well from coronary standpoint.  Did have documentation of bypass graft failure but with adjustment of medication, now rendered asymptomatic from the standpoint of angina.  Continue aggressive risk mitigation: Discussed A1c less than 7, LDL less than 70, blood pressure 130/80 mmHg, and mandatory 150 minutes of moderate aerobic activity per week. 2. A1c target less than 7.  SGLT2 therapy has been instituted in the form of Jardiance. 3. Target blood pressure 130/80.  I contemplated increasing carvedilol to 6.25 mg twice daily until you tell me that today's blood pressures are without any medication on board.  He is encouraged to take his medication as soon as possible. 4. LDL target less than 70.  Most recent was 70 in July.  Low fat diet also prescribed. 5. No evidence of volume overload on exam.  Clinical follow-up in 1 year.  Call if chest discomfort.  Adhere to risk medication as outlined above.   Medication Adjustments/Labs and Tests Ordered: Current medicines are reviewed at length with the patient today.  Concerns regarding medicines are outlined above.  No orders of the defined types were placed in this encounter.  No orders of the defined types were placed in this encounter.   Patient Instructions  Medication Instructions:  Your physician recommends that you continue on  your current medications as directed. Please refer to the Current Medication list given to you today.   Labwork: None    Testing/Procedures: None   Follow-Up: Your physician wants you to follow-up in 1 year with Dr. [name]. You will receive a reminder letter in the mail two months in advance. If you don't receive a letter, please call our office to schedule the follow-up appointment.   Any Other Special Instructions Will Be Listed Below (If Applicable).     If you need a refill on your cardiac medications before your next appointment, please call your pharmacy.      Signed, Sinclair Grooms, MD  06/10/2018 9:31 AM    [place]

## 2018-06-10 ENCOUNTER — Encounter: Payer: Self-pay | Admitting: Interventional Cardiology

## 2018-06-10 ENCOUNTER — Ambulatory Visit (INDEPENDENT_AMBULATORY_CARE_PROVIDER_SITE_OTHER): Payer: Managed Care, Other (non HMO) | Admitting: Interventional Cardiology

## 2018-06-10 VITALS — BP 136/90 | HR 72 | Ht 67.0 in | Wt 181.4 lb

## 2018-06-10 DIAGNOSIS — Z951 Presence of aortocoronary bypass graft: Secondary | ICD-10-CM

## 2018-06-10 DIAGNOSIS — I5022 Chronic systolic (congestive) heart failure: Secondary | ICD-10-CM

## 2018-06-10 DIAGNOSIS — E118 Type 2 diabetes mellitus with unspecified complications: Secondary | ICD-10-CM

## 2018-06-10 DIAGNOSIS — I25709 Atherosclerosis of coronary artery bypass graft(s), unspecified, with unspecified angina pectoris: Secondary | ICD-10-CM

## 2018-06-10 DIAGNOSIS — I10 Essential (primary) hypertension: Secondary | ICD-10-CM

## 2018-06-10 DIAGNOSIS — E7800 Pure hypercholesterolemia, unspecified: Secondary | ICD-10-CM

## 2018-06-10 NOTE — Patient Instructions (Signed)

## 2018-06-21 ENCOUNTER — Ambulatory Visit (INDEPENDENT_AMBULATORY_CARE_PROVIDER_SITE_OTHER): Payer: Managed Care, Other (non HMO) | Admitting: Endocrinology

## 2018-06-21 ENCOUNTER — Encounter: Payer: Self-pay | Admitting: Endocrinology

## 2018-06-21 VITALS — BP 112/72 | HR 77 | Ht 67.0 in | Wt 175.4 lb

## 2018-06-21 DIAGNOSIS — E1129 Type 2 diabetes mellitus with other diabetic kidney complication: Secondary | ICD-10-CM | POA: Diagnosis not present

## 2018-06-21 DIAGNOSIS — E118 Type 2 diabetes mellitus with unspecified complications: Secondary | ICD-10-CM

## 2018-06-21 DIAGNOSIS — E1159 Type 2 diabetes mellitus with other circulatory complications: Secondary | ICD-10-CM | POA: Diagnosis not present

## 2018-06-21 LAB — POCT GLYCOSYLATED HEMOGLOBIN (HGB A1C): Hemoglobin A1C: 6.5 % — AB (ref 4.0–5.6)

## 2018-06-21 MED ORDER — BROMOCRIPTINE MESYLATE 5 MG PO CAPS: 5.0000 mg | ORAL_CAPSULE | Freq: Every day | ORAL | 3 refills | Status: DC

## 2018-06-21 NOTE — Patient Instructions (Signed)
Please continue the same diabetes medications. check your blood sugar once a Nolt.  vary the time of Lisowski when you check, between before the 3 meals, and at bedtime.  also check if you have symptoms of your blood sugar being too high or too low.  please keep a record of the readings and bring it to your next appointment here (or you can bring the meter itself).  You can write it on any piece of paper.  please call us sooner if your blood sugar goes below 70, or if you have a lot of readings over 200.  Please come back for a follow-up appointment in 4-5 months.    

## 2018-06-21 NOTE — Progress Notes (Signed)
Subjective:    Patient ID: Jerome Vaughn, male    DOB: 1951/06/13, 67 y.o.   MRN: [id number]  HPI Pt returns for f/u of diabetes mellitus:  DM type: 2 Dx'ed: 8527 Complications: CAD and renal insuff.   Therapy: 4 oral meds.  DKA: never.  Severe hypoglycemia: never.  Pancreatitis: never.  Other: he has never been on insulin, but he has learned how; he stopped pioglitizone, due to edema.   Interval history: pt states he feels well in general.  He takes meds as rx'ed.  Meter is downloaded today, and the printout is scanned into the record.  cbg varies from 70-170.  There is no trend throughout the [place]. Past Medical History:  Diagnosis Date  . Carpal tunnel syndrome    both hands  . Coronary artery disease    a. Cath 11/21/2015: Multivessel CAD --> CABG recommended. b. CABG on 11/22/2015:  LIMA-LAD, SVG-Diag, SVG-OM1 and distal Cx, and SVG-PDA.  . Diabetes mellitus   . History of echocardiogram    a. Echo 4/17: Moderate LVH, EF 50-55%, mild LAE, no pericardial effusion  . Hyperlipidemia   . Hypertension     Past Surgical History:  Procedure Laterality Date  . CARDIAC CATHETERIZATION N/A 11/21/2015   Procedure: Left Heart Cath and Coronary Angiography;  Surgeon: [name], MD;  Location: [facility];  Service: Cardiovascular;  Laterality: N/A;  . colonsocopy  12/07/2009   per Dr. [name], benign polyps, repeat in 10 yrs   . CORONARY ARTERY BYPASS GRAFT N/A 11/22/2015   Procedure: CORONARY ARTERY BYPASS GRAFTING (CABG) times five using the left internal mammary, right greater saphenous vein EVH, and left thigh greater saphenous vein EVH;  Surgeon: [name], MD;  Location: [facility];  Service: Open Heart Surgery;  Laterality: N/A;  . frozen shoulder release    . LEFT HEART CATH AND CORS/GRAFTS ANGIOGRAPHY N/A 10/27/2017   Procedure: LEFT HEART CATH AND CORS/GRAFTS ANGIOGRAPHY;  Surgeon: [name], MD;  Location: [facility];  Service: Cardiovascular;  Laterality:  N/A;  . TEE WITHOUT CARDIOVERSION N/A 11/22/2015   Procedure: TRANSESOPHAGEAL ECHOCARDIOGRAM (TEE);  Surgeon: [name], MD;  Location: [facility];  Service: Open Heart Surgery;  Laterality: N/A;  . WRIST GANGLION EXCISION      Social History   Socioeconomic History  . Marital status: Married    Spouse name: Not on file  . Number of children: 5  . Years of education: Not on file  . Highest education level: Not on file  Occupational History  . Occupation: Counsellor  Social Needs  . Financial resource strain: Not on file  . Food insecurity:    Worry: Not on file    Inability: Not on file  . Transportation needs:    Medical: Not on file    Non-medical: Not on file  Tobacco Use  . Smoking status: Former Smoker    Packs/Ratliff: 0.25    Years: 20.00    Pack years: 5.00    Types: Cigarettes    Last attempt to quit: 10/07/2013    Years since quitting: 4.7  . Smokeless tobacco: Never Used  . Tobacco comment: quit cigarettes, might have a cigar 1 X month  Substance and Sexual Activity  . Alcohol use: Yes    Alcohol/week: 3.0 standard drinks    Types: 3 Standard drinks or equivalent per week  . Drug use: No  . Sexual activity: Not on file  Lifestyle  .  Physical activity:    Days per week: Not on file    Minutes per session: Not on file  . Stress: Not on file  Relationships  . Social connections:    Talks on phone: Not on file    Gets together: Not on file    Attends religious service: Not on file    Active member of club or organization: Not on file    Attends meetings of clubs or organizations: Not on file    Relationship status: Not on file  . Intimate partner violence:    Fear of current or ex partner: Not on file    Emotionally abused: Not on file    Physically abused: Not on file    Forced sexual activity: Not on file  Other Topics Concern  . 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 Ascorbic Acid (VITAMIN C) 100 MG tablet Take 100 mg by mouth daily.    Marland Kitchen aspirin EC 81 MG tablet Take 1 tablet (81 mg total) by mouth daily.    . benazepril (LOTENSIN) 20 MG tablet Take 1 tablet (20 mg total) by mouth daily. 90 tablet 1  . Blood Glucose Monitoring Suppl (ONETOUCH VERIO FLEX SYSTEM) w/Device KIT USE AS DIRECTED 1 kit 0  . carvedilol (COREG) 3.125 MG tablet Take 3.125 mg by mouth 2 (two) times daily with a meal.    . CINNAMON PO Take 2,000 mg by mouth daily.     . Cyanocobalamin (VITAMIN B-12 PO) Take 1 tablet by mouth daily.    . empagliflozin (JARDIANCE) 25 MG TABS tablet Take 25 mg by mouth daily. 90 tablet 2  . ezetimibe (ZETIA) 10 MG tablet Take 10 mg by mouth daily.  3  . ferrous sulfate 325 (65 FE) MG tablet Take 325 mg by mouth daily with breakfast.    . glucose blood (ONETOUCH VERIO) test strip Use as instructed to check BG once a [place]eter[place] 100 each 12  . Lancets (ACCU-CHEK SOFT TOUCH) lancets Dispense for Aviva plus and diagnosis code is E 11.9 100 each 0  . metFORMIN (GLUCOPHAGE-XR) 500 MG 24 hr tablet TAKE 4 TABLETS BY MOUTH ONCE DAILY 360 tablet 0  . methocarbamol (ROBAXIN) 500 MG tablet Take 500 mg by mouth every 8 (eight) hours as needed for muscle spasms.  0  . Multiple Vitamin (MULTIVITAMIN) tablet Take 1 tablet by mouth daily.      . nitroGLYCERIN (NITROSTAT) 0.4 MG SL tablet Place 1 tablet (0.4 mg total) under the tongue every 5 (five) minutes as needed for chest pain. 25 tablet 5  . Omega-3 Fatty Acids (FISH OIL) 1000 MG CAPS Take 1,000 mg by mouth daily.     . rosuvastatin (CRESTOR) 40 MG tablet TAKE ONE TABLET BY MOUTH DAILY AS DIRECTED FOR CHOLESTEROL 90 tablet 3  . sitaGLIPtin (JANUVIA) 100 MG tablet Take 1 tablet (100 mg total) by mouth daily. 90 tablet 1  . tadalafil (CIALIS) 20 MG tablet Take 1  tablet (20 mg total) by mouth daily as needed for erectile dysfunction. 30 tablet 3   No current facility-administered medications on file prior to visit.     No Known Allergies  Family History  Problem Relation Age of Onset  .  Anuerysm Brother   . Hyperlipidemia Sister   . Diabetes Sister   . Hypertension Sister   . Diabetes Maternal Grandmother   . Hyperlipidemia Sister   . Kidney disease Sister   . Heart attack Neg Hx     BP 112/72 (BP Location: Left Arm)   Pulse 77   Ht _0  (1.702 m)   Wt 175 lb 6.4 oz (79.6 kg)   SpO2 95%   BMI 27.47 kg/m    Review of Systems He denies hypoglycemia    Objective:   Physical Exam VITAL SIGNS:  See vs page GENERAL: no distress Pulses: foot pulses are intact bilaterally.   MSK: no deformity of the feet or ankles.  CV: no edema of the legs or ankles Skin:  no ulcer on the feet or ankles.  normal color and temp on the feet and ankles Neuro: sensation is intact to touch on the feet and ankles.     Lab Results  Component Value Date   HGBA1C 6.5 (A) 06/21/2018   Lab Results  Component Value Date   CREATININE 1.14 04/08/2018   BUN 20 04/08/2018   NA 137 04/08/2018   K 4.4 04/08/2018   CL 103 04/08/2018   CO2 24 04/08/2018      Assessment & Plan:  Type 2 DM: well-controlled, with CAD Renal insuff: this limits rx options.     Patient Instructions  Please continue the same diabetes medications. check your blood sugar once a Gillian.  vary the time of [place] when you check, between before the 3 meals, and at bedtime.  also check if you have symptoms of your blood sugar being too high or too low.  please keep a record of the readings and bring it to your next appointment here (or you can bring the meter itself).  You can write it on any piece of paper.  please call us sooner if your blood sugar goes below 70, or if you have a lot of readings over 200. Please come back for a follow-up appointment in 4-5 months.

## 2018-06-25 ENCOUNTER — Other Ambulatory Visit: Payer: Self-pay

## 2018-06-25 ENCOUNTER — Telehealth: Payer: Self-pay | Admitting: Endocrinology

## 2018-06-25 MED ORDER — BROMOCRIPTINE MESYLATE 2.5 MG PO TABS: 2.5000 mg | ORAL_TABLET | Freq: Every day | ORAL | 0 refills | Status: DC

## 2018-06-25 NOTE — Telephone Encounter (Signed)
done

## 2018-06-25 NOTE — Telephone Encounter (Signed)
OK 

## 2018-06-25 NOTE — Telephone Encounter (Signed)
Ok to change dosage?

## 2018-06-25 NOTE — Telephone Encounter (Signed)
Pt requests the following changes: bromocriptine (PARLODEL) 5 MG capsule would like it be 2.5 MG bc he cannot afford the 5 MG  Generic Viagra refill  Pharmacy [facility] in [place] Ph # [phone]  Please Advice

## 2018-07-12 ENCOUNTER — Other Ambulatory Visit: Payer: Self-pay | Admitting: Interventional Cardiology

## 2018-07-12 MED ORDER — CARVEDILOL 3.125 MG PO TABS: 3.1250 mg | ORAL_TABLET | Freq: Two times a day (BID) | ORAL | 3 refills | Status: DC

## 2018-07-12 NOTE — Telephone Encounter (Signed)
° ° ° °*  STAT* If patient is at the pharmacy, call can be transferred to refill team.   1. Which medications need to be refilled? (please list name of each medication and dose if known) carvedilol (COREG) 3.125 MG tablet  2. Which pharmacy/location (including street and city if local pharmacy) is medication to be sent to?[place], [address]  3. Do they need a [address] [place] or [address] supply? [place]

## 2018-07-12 NOTE — Telephone Encounter (Signed)
Pt's medication was sent to pt's pharmacy a requested. Confirmation received.  

## 2018-07-29 ENCOUNTER — Other Ambulatory Visit: Payer: Self-pay | Admitting: Family Medicine

## 2018-07-29 NOTE — Telephone Encounter (Signed)
Copied from [place] [phone]. Topic: Quick Communication - Rx Refill/Question >> Jul 29, 2018  2:31 PM Leward Quan A wrote: Medication: rosuvastatin (CRESTOR) 40 MG tablet  Patient is completely out of this medication  Has the patient contacted their pharmacy? Yes.     Preferred Pharmacy (with phone number or street name): [facility] [place] [phone] (Phone) [phone] (Fax)    Agent: Please be advised that RX refills may take up to 3 business days. We ask that you follow-up with your pharmacy.

## 2018-08-10 ENCOUNTER — Encounter: Payer: Self-pay | Admitting: Endocrinology

## 2018-08-10 LAB — HM DIABETES EYE EXAM

## 2018-08-29 ENCOUNTER — Encounter (HOSPITAL_COMMUNITY): Payer: Self-pay | Admitting: *Deleted

## 2018-08-29 ENCOUNTER — Ambulatory Visit (HOSPITAL_COMMUNITY)
Admission: EM | Admit: 2018-08-29 | Discharge: 2018-08-29 | Disposition: A | Discharge status: Home / Self Care | Payer: 59 | Attending: Family Medicine | Admitting: Family Medicine

## 2018-08-29 DIAGNOSIS — M545 Low back pain, unspecified: Secondary | ICD-10-CM

## 2018-08-29 MED ORDER — TIZANIDINE HCL 4 MG PO CAPS: 4.0000 mg | ORAL_CAPSULE | Freq: Three times a day (TID) | ORAL | 0 refills | Status: DC

## 2018-08-29 MED ORDER — NAPROXEN 500 MG PO TABS: 500.0000 mg | ORAL_TABLET | Freq: Two times a day (BID) | ORAL | 0 refills | Status: DC

## 2018-08-29 MED ORDER — ACETAMINOPHEN 500 MG PO TABS: 500.0000 mg | ORAL_TABLET | Freq: Four times a day (QID) | ORAL | 0 refills | Status: DC | PRN

## 2018-08-29 NOTE — ED Triage Notes (Addendum)
C/O intermittent low back pain onset 4 days ago; over past 2 days pain has become more constant.  Denies injury.  Denies any radiation or parasthesias.  Pt reports painful movements.

## 2018-08-29 NOTE — Discharge Instructions (Signed)
This is most likely a lower lumbar strain or muscle spasm. We will give you naproxen to take twice a Strutz with food to help with pain and inflammation Zanaflex for muscle relaxant.  Be aware this may make you drowsy. Heat/stretching/gentle massage to the area You can also take an additional Tylenol for pain if you need that Follow up as needed for continued or worsening symptoms

## 2018-08-29 NOTE — ED Provider Notes (Signed)
Jerome Vaughn    CSN: [id number] Arrival date & time: 08/29/18  1029     History   Chief Complaint Chief Complaint  Patient presents with  . Back Pain    HPI Jerome Vaughn is a 67 y.o. male.   Patient is a 67 year old male presents for lower lumbar pain that is been waxing and waning and worsening over the last couple days.  The pain is worse with getting out of bed and certain movements.  He has not tried anything to treat his back pain.  He did get a deep tissue massage from his son this morning that seemed to help relieve some of the pain.  He denies any radiation of pain, numbness, tingling, saddle paresthesias.  He denies any bowel or bladder incontinence.  No fevers.  No injuries to the back, strenuous activity or heavy lifting.  ROS per HPI      Past Medical History:  Diagnosis Date  . Carpal tunnel syndrome    both hands  . Coronary artery disease    a. Cath 11/21/2015: Multivessel CAD --> CABG recommended. b. CABG on 11/22/2015:  LIMA-LAD, SVG-Diag, SVG-OM1 and distal Cx, and SVG-PDA.  . Diabetes mellitus   . History of echocardiogram    a. Echo 4/17: Moderate LVH, EF 50-55%, mild LAE, no pericardial effusion  . Hyperlipidemia   . Hypertension     Patient Active Problem List   Diagnosis Date Noted  . Erectile disorder due to medical condition in male 04/08/2018  . Chronic systolic heart failure ([place]) 07/29/2017  . Hypogonadism male 02/13/2016  . Pericardial effusion 12/24/2015  . Type 2 diabetes mellitus with complication, without long-term current use of insulin ([place]) 12/10/2015  . 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 Abnormal stress test 11/21/2015  . Coronary artery disease involving coronary bypass graft of native heart with angina pectoris ([place])   . Nonspecific abnormal electrocardiogram (ECG) (EKG) 11/07/2015  . Hypertension 03/30/2011  . Hyperlipidemia 03/30/2011  . Tobacco use disorder 03/30/2011  . Colon polyp 03/30/2011    Past Surgical History:    Procedure Laterality Date  . CARDIAC CATHETERIZATION N/A 11/21/2015   Procedure: Left Heart Cath and Coronary Angiography;  Surgeon: [name], MD;  Location: [facility];  Service: Cardiovascular;  Laterality: N/A;  . colonsocopy  12/07/2009   per Dr. [name], benign polyps, repeat in 10 yrs   . CORONARY ARTERY BYPASS GRAFT N/A 11/22/2015   Procedure: CORONARY ARTERY BYPASS GRAFTING (CABG) times five using the left internal mammary, right greater saphenous vein EVH, and left thigh greater saphenous vein EVH;  Surgeon: [name], MD;  Location: [facility];  Service: Open Heart Surgery;  Laterality: N/A;  . frozen shoulder release    . LEFT HEART CATH AND CORS/GRAFTS ANGIOGRAPHY N/A 10/27/2017   Procedure: LEFT HEART CATH AND CORS/GRAFTS ANGIOGRAPHY;  Surgeon: [name], MD;  Location: [facility];  Service: Cardiovascular;  Laterality: N/A;  . TEE WITHOUT CARDIOVERSION N/A 11/22/2015   Procedure: TRANSESOPHAGEAL ECHOCARDIOGRAM (TEE);  Surgeon: [name], MD;  Location: [facility];  Service: Open Heart Surgery;  Laterality: N/A;  . WRIST GANGLION EXCISION         Home Medications    Prior to Admission medications   Medication Sig Start Date End Date Taking? Authorizing Provider  Ascorbic Acid (VITAMIN C) 100 MG tablet Take 100 mg by mouth daily.   Yes [provider]  aspirin EC 81 MG tablet  Take 1 tablet (81 mg total) by mouth daily. 06/24/17  Yes Burtis Junes, NP  benazepril (LOTENSIN) 20 MG tablet Take 1 tablet (20 mg total) by mouth daily. 05/17/18  Yes [name], MD  bromocriptine (PARLODEL) 2.5 MG tablet Take 1 tablet (2.5 mg total) by mouth daily. 06/25/18  Yes [name], MD  carvedilol (COREG) 3.125 MG tablet Take 1 tablet (3.125 mg total) by mouth 2 (two) times daily with a meal. 07/12/18  Yes [name], MD  CINNAMON PO Take 2,000 mg by mouth daily.    Yes [provider]  Cyanocobalamin (VITAMIN B-12 PO) Take 1 tablet by  mouth daily.   Yes [provider]  empagliflozin (JARDIANCE) 25 MG TABS tablet Take 25 mg by mouth daily. 03/31/18  Yes [name], MD  ezetimibe (ZETIA) 10 MG tablet Take 10 mg by mouth daily. 05/21/18  Yes [provider]  ferrous sulfate 325 (65 FE) MG tablet Take 325 mg by mouth daily with breakfast.   Yes [provider]  metFORMIN (GLUCOPHAGE-XR) 500 MG 24 hr tablet TAKE 4 TABLETS BY MOUTH ONCE DAILY 04/09/18  Yes [name], MD  Multiple Vitamin (MULTIVITAMIN) tablet Take 1 tablet by mouth daily.     Yes [provider]  Omega-3 Fatty Acids (FISH OIL) 1000 MG CAPS Take 1,000 mg by mouth daily.    Yes [provider]  rosuvastatin (CRESTOR) 40 MG tablet TAKE ONE TABLET BY MOUTH DAILY AS DIRECTED FOR CHOLESTEROL 04/08/18  Yes [name], MD  sitaGLIPtin (JANUVIA) 100 MG tablet Take 1 tablet (100 mg total) by mouth daily. 05/26/18  Yes [name], MD  acetaminophen (TYLENOL) 500 MG tablet Take 1 tablet (500 mg total) by mouth every 6 (six) hours as needed. 08/29/18   Orvan July, NP  Blood Glucose Monitoring Suppl (ONETOUCH VERIO FLEX SYSTEM) w/Device KIT USE AS DIRECTED 06/22/17   [name], MD  glucose blood [facility] VERIO) test strip Use as instructed to check BG once a [place] 04/22/18   [name], MD  Lancets (ACCU-CHEK SOFT [facility]) lancets Dispense for Aviva plus and diagnosis code is E 11.9 03/31/18   [name], MD  naproxen (NAPROSYN) 500 MG tablet Take 1 tablet (500 mg total) by mouth 2 (two) times daily. 08/29/18   Loura Halt A, NP  nitroGLYCERIN (NITROSTAT) 0.4 MG SL tablet Place 1 tablet (0.4 mg total) under the tongue every 5 (five) minutes as needed for chest pain. 09/14/17   [name], MD  tadalafil (CIALIS) 20 MG tablet Take 1 tablet (20 mg total) by mouth daily as needed for erectile dysfunction. 04/08/18   [name], MD  tiZANidine (ZANAFLEX) 4 MG capsule Take 1 capsule (4 mg total) by mouth 3 (three) times  daily. 08/29/18   Orvan July, NP    Family History Family History  Problem Relation Age of Onset  . Anuerysm Brother   . Hyperlipidemia Sister   . Diabetes Sister   . Hypertension Sister   . Diabetes Maternal Grandmother   . Hyperlipidemia Sister   . Kidney disease Sister   . Heart attack Neg Hx     Social History Social History   Tobacco Use  . Smoking status: Former Smoker    Packs/Rutt: 0.25    Years: 20.00    Pack years: 5.00    Types: Cigarettes    Last attempt to quit: 10/07/2013    Years since quitting: 4.8  . Smokeless  tobacco: Never Used  . Tobacco comment: quit cigarettes, might have a cigar 1 X month  Substance Use Topics  . Alcohol use: Yes    Alcohol/week: 3.0 standard drinks    Types: 3 Standard drinks or equivalent per week  . Drug use: No     Allergies   Patient has no known allergies.   Review of Systems Review of Systems   Physical Exam Triage Vital Signs ED Triage Vitals  Enc Vitals Group     BP 08/29/18 1106 113/80     Pulse Rate 08/29/18 1106 80     Resp 08/29/18 1106 16     Temp 08/29/18 1106 (!) 97.4 F (36.3 C)     Temp Source 08/29/18 1106 Oral     SpO2 08/29/18 1106 98 %     Weight --      Height --      Head Circumference --      Peak Flow --      Pain Score 08/29/18 1108 7     Pain Loc --      Pain Edu? --      Excl. in [place]? --    No data found.  Updated Vital Signs BP 113/80   Pulse 80   Temp (!) 97.4 F (36.3 C) (Oral)   Resp 16   SpO2 98%   Visual Acuity Right Eye Distance:   Left Eye Distance:   Bilateral Distance:    Right Eye Near:   Left Eye Near:    Bilateral Near:     Physical Exam  Constitutional: He appears well-developed and well-nourished.  HENT:  Head: Normocephalic.  Eyes: Conjunctivae are normal.  Neck: Normal range of motion.  Pulmonary/Chest: Effort normal.  Abdominal: Soft.  Musculoskeletal: Normal range of motion. He exhibits tenderness. He exhibits no edema or deformity.    Tenderness to bilateral lower lumbar paravertebral musculature.  No bony tenderness.  No swelling, erythema, ecchymosis or deformities.  Neurological: He is alert.  Skin: Skin is warm and dry.  Nursing note and vitals reviewed.    UC Treatments / Results  Labs (all labs ordered are listed, but only abnormal results are displayed) Labs Reviewed - No data to display  EKG None  Radiology No results found.  Procedures Procedures (including critical care time)  Medications Ordered in UC Medications - No data to display  Initial Impression / Assessment and Plan / UC Course  I have reviewed the triage vital signs and the nursing notes.  Pertinent labs & imaging results that were available during my care of the patient were reviewed by me and considered in my medical decision making (see chart for details).     Most likely lower lumbar strain/muscle spasm. Will treat with low-dose muscle relaxant at bedtime Naproxen twice a Dominey as needed for pain inflammation For worsening or continued symptoms he will need to follow-up with his primary care provider Final Clinical Impressions(s) / UC Diagnoses   Final diagnoses:  Acute bilateral low back pain without sciatica     Discharge Instructions     This is most likely a lower lumbar strain or muscle spasm. We will give you naproxen to take twice a [place] with food to help with pain and inflammation Zanaflex for muscle relaxant.  Be aware this may make you drowsy. Heat/stretching/gentle massage to the area You can also take an additional Tylenol for pain if you need that Follow up as needed for continued or worsening symptoms  ED Prescriptions    Medication Sig Dispense Auth. Provider   tiZANidine (ZANAFLEX) 4 MG capsule Take 1 capsule (4 mg total) by mouth 3 (three) times daily. 15 capsule Rise Traeger A, NP   naproxen (NAPROSYN) 500 MG tablet Take 1 tablet (500 mg total) by mouth 2 (two) times daily. 30 tablet Evamarie Raetz  A, NP   acetaminophen (TYLENOL) 500 MG tablet Take 1 tablet (500 mg total) by mouth every 6 (six) hours as needed. 30 tablet Loura Halt A, NP     Controlled Substance Prescriptions [place] Controlled Substance Registry consulted? Not Applicable   Orvan July, NP 08/29/18 1141

## 2018-09-26 ENCOUNTER — Other Ambulatory Visit: Payer: Self-pay | Admitting: Endocrinology

## 2018-10-01 ENCOUNTER — Telehealth: Payer: Self-pay | Admitting: Endocrinology

## 2018-10-01 ENCOUNTER — Telehealth: Payer: Self-pay | Admitting: Family Medicine

## 2018-10-01 MED ORDER — SILDENAFIL CITRATE 100 MG PO TABS: 100.0000 mg | ORAL_TABLET | Freq: Every day | ORAL | 11 refills | Status: DC | PRN

## 2018-10-01 MED ORDER — ALOGLIPTIN BENZOATE 25 MG PO TABS: 1.0000 | ORAL_TABLET | Freq: Every day | ORAL | 3 refills | Status: DC

## 2018-10-01 NOTE — Telephone Encounter (Signed)
Call to verify medication needed- Rod Can is current on list- Viagra is history. Left message to call back to make sure which medication patient wants.

## 2018-10-01 NOTE — Telephone Encounter (Signed)
See patients request for Viagra instead of Cailas / Will send to provider as Viagra is not on active medication list /

## 2018-10-01 NOTE — Telephone Encounter (Signed)
Call in Viagra 100 mg to take as needed, #10 with 11 rf

## 2018-10-01 NOTE — Telephone Encounter (Signed)
Pt called back in returning Opal Sidles call about medication. Pt says that he would like to have the Viagra filled instead of Cialis

## 2018-10-01 NOTE — Telephone Encounter (Signed)
Copied from [place] [id number]. Topic: Quick Communication - Rx Refill/Question >> Oct 01, 2018  1:33 PM Oneta Rack wrote: Medication: sildenafil  Has the patient contacted their pharmacy? Yes   (Agent: If yes, when and what did the pharmacy advise?) new pharmacy   Preferred Pharmacy (with phone number or street name):  [facility], [place] [phone] (Phone) [phone] (Fax)   Agent: Please be advised that RX refills may take up to 3 business days. We ask that you follow-up with your pharmacy.

## 2018-10-01 NOTE — Telephone Encounter (Signed)
viagra has been sent to the pharmacy per pts request.

## 2018-10-01 NOTE — Telephone Encounter (Signed)
Ok, I have sent a prescription to your pharmacy, to switch.

## 2018-10-01 NOTE — Telephone Encounter (Signed)
Please advise if ok to switch

## 2018-10-01 NOTE — Telephone Encounter (Signed)
Patient is stating that sitaGLIPtin (JANUVIA) 100 MG tablet has become to expensive. He has called insurance company and they offered switching alogliptin as an alternative. Please Advise, thanks

## 2018-10-05 ENCOUNTER — Telehealth: Payer: Self-pay | Admitting: Endocrinology

## 2018-10-05 NOTE — Telephone Encounter (Signed)
Paper work has been dropped off by patient to be filled out by Dr. Loanne Drilling. Patient also attached a stamped envelope for documents to be mailed once completed. Please Inform patient when they have been placed in the mail, thanks.  Documents have been placed in Dr. Cordelia Pen box. Patient was informed it will take up to 5-7 business days to be completed.

## 2018-10-05 NOTE — Telephone Encounter (Signed)
Pt assistance forms have been placed on Dr. Loanne Drilling desk for signature

## 2018-10-06 NOTE — Telephone Encounter (Signed)
Dr. Loanne Drilling completed and returned [employer] Patient Assistance Program Enrollment Forms. Copy of completed documents made, labeled and placed in scan file for HIM scanning purposes and for our future reference. Called pt and informed documents have been completed and mailed as requested.

## 2018-10-21 ENCOUNTER — Ambulatory Visit (INDEPENDENT_AMBULATORY_CARE_PROVIDER_SITE_OTHER): Payer: 59 | Admitting: Endocrinology

## 2018-10-21 ENCOUNTER — Encounter: Payer: Self-pay | Admitting: Endocrinology

## 2018-10-21 VITALS — BP 106/70 | HR 66 | Ht 67.0 in | Wt 177.4 lb

## 2018-10-21 DIAGNOSIS — E118 Type 2 diabetes mellitus with unspecified complications: Secondary | ICD-10-CM

## 2018-10-21 LAB — POCT GLYCOSYLATED HEMOGLOBIN (HGB A1C): Hemoglobin A1C: 6.7 % — AB (ref 4.0–5.6)

## 2018-10-21 NOTE — Progress Notes (Signed)
Subjective:    Patient ID: Jerome Vaughn, male    DOB: 1950-10-26, 68 y.o.   MRN: [id number]  HPI Pt returns for f/u of diabetes mellitus:  DM type: 2 Dx'ed: 6811 Complications: CAD and renal insuff.   Therapy: 4 oral meds.  DKA: never.  Severe hypoglycemia: never.  Pancreatitis: never.  Other: he has never been on insulin, but he has learned how; he stopped pioglitizone, due to edema.   Interval history: pt states he feels well in general.  He stopped [place] 3 weeks ago, due to cost.  Meter is downloaded today, and the printout is scanned into the record.  He checks fasting.  cbg varies from 120-170.   Past Medical History:  Diagnosis Date  . Carpal tunnel syndrome    both hands  . Coronary artery disease    a. Cath 11/21/2015: Multivessel CAD --> CABG recommended. b. CABG on 11/22/2015:  LIMA-LAD, SVG-Diag, SVG-OM1 and distal Cx, and SVG-PDA.  . Diabetes mellitus   . History of echocardiogram    a. Echo 4/17: Moderate LVH, EF 50-55%, mild LAE, no pericardial effusion  . Hyperlipidemia   . Hypertension     Past Surgical History:  Procedure Laterality Date  . CARDIAC CATHETERIZATION N/A 11/21/2015   Procedure: Left Heart Cath and Coronary Angiography;  Surgeon: [name], MD;  Location: [facility];  Service: Cardiovascular;  Laterality: N/A;  . colonsocopy  12/07/2009   per Dr. [name], benign polyps, repeat in 10 yrs   . CORONARY ARTERY BYPASS GRAFT N/A 11/22/2015   Procedure: CORONARY ARTERY BYPASS GRAFTING (CABG) times five using the left internal mammary, right greater saphenous vein EVH, and left thigh greater saphenous vein EVH;  Surgeon: [name], MD;  Location: [facility];  Service: Open Heart Surgery;  Laterality: N/A;  . frozen shoulder release    . LEFT HEART CATH AND CORS/GRAFTS ANGIOGRAPHY N/A 10/27/2017   Procedure: LEFT HEART CATH AND CORS/GRAFTS ANGIOGRAPHY;  Surgeon: [name], MD;  Location: [facility];  Service: Cardiovascular;   Laterality: N/A;  . TEE WITHOUT CARDIOVERSION N/A 11/22/2015   Procedure: TRANSESOPHAGEAL ECHOCARDIOGRAM (TEE);  Surgeon: [name], MD;  Location: [facility];  Service: Open Heart Surgery;  Laterality: N/A;  . WRIST GANGLION EXCISION      Social History   Socioeconomic History  . Marital status: Married    Spouse name: Not on file  . Number of children: 5  . Years of education: Not on file  . Highest education level: Not on file  Occupational History  . Occupation: Counsellor  Social Needs  . Financial resource strain: Not on file  . Food insecurity:    Worry: Not on file    Inability: Not on file  . Transportation needs:    Medical: Not on file    Non-medical: Not on file  Tobacco Use  . Smoking status: Former Smoker    Packs/Stachowski: 0.25    Years: 20.00    Pack years: 5.00    Types: Cigarettes    Last attempt to quit: 10/07/2013    Years since quitting: 5.0  . Smokeless tobacco: Never Used  . Tobacco comment: quit cigarettes, might have a cigar 1 X month  Substance and Sexual Activity  . Alcohol use: Yes    Alcohol/week: 3.0 standard drinks    Types: 3 Standard drinks or equivalent per week  . Drug use: No  . Sexual activity: Not on file  Lifestyle  .  Physical activity:    Days per week: Not on file    Minutes per session: Not on file  . Stress: Not on file  Relationships  . Social connections:    Talks on phone: Not on file    Gets together: Not on file    Attends religious service: Not on file    Active member of club or organization: Not on file    Attends meetings of clubs or organizations: Not on file    Relationship status: Not on file  . Intimate partner violence:    Fear of current or ex partner: Not on file    Emotionally abused: Not on file    Physically abused: Not on file    Forced sexual activity: Not on file  Other Topics Concern  . 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 acetaminophen (TYLENOL) 500 MG tablet Take 1 tablet (500 mg total) by mouth every 6 (six) hours as needed. 30 tablet 0  . Ascorbic Acid (VITAMIN C) 100 MG tablet Take 100 mg by mouth daily.    Marland Kitchen aspirin EC 81 MG tablet Take 1 tablet (81 mg total) by mouth daily.    . benazepril (LOTENSIN) 20 MG tablet Take 1 tablet (20 mg total) by mouth daily. 90 tablet 1  . Blood Glucose Monitoring Suppl (ONETOUCH VERIO FLEX SYSTEM) w/Device KIT USE AS DIRECTED 1 kit 0  . bromocriptine (PARLODEL) 2.5 MG tablet TAKE 1 TABLET BY MOUTH ONCE DAILY 90 tablet 0  . carvedilol (COREG) 3.125 MG tablet Take 1 tablet (3.125 mg total) by mouth 2 (two) times daily with a meal. 180 tablet 3  . CINNAMON PO Take 2,000 mg by mouth daily.     . Cyanocobalamin (VITAMIN B-12 PO) Take 1 tablet by mouth daily.    . empagliflozin (JARDIANCE) 25 MG TABS tablet Take 25 mg by mouth daily. 90 tablet 2  . ezetimibe (ZETIA) 10 MG tablet Take 10 mg by mouth daily.  3  . ferrous sulfate 325 (65 FE) MG tablet Take 325 mg by mouth daily with breakfast.    . glucose blood (ONETOUCH VERIO) test strip Use as instructed to check BG once a [place] 100 each 12  . metFORMIN (GLUCOPHAGE-XR) 500 MG 24 hr tablet TAKE 4 TABLETS BY MOUTH ONCE DAILY 360 tablet 0  . Multiple Vitamin (MULTIVITAMIN) tablet Take 1 tablet by mouth daily.      . naproxen (NAPROSYN) 500 MG tablet Take 1 tablet (500 mg total) by mouth 2 (two) times daily. 30 tablet 0  . nitroGLYCERIN (NITROSTAT) 0.4 MG SL tablet Place 1 tablet (0.4 mg total) under the tongue every 5 (five) minutes as needed for chest pain. 25 tablet 5  . Omega-3 Fatty Acids (FISH OIL) 1000 MG CAPS Take 1,000 mg by mouth daily.     . rosuvastatin (CRESTOR) 40 MG tablet TAKE ONE TABLET BY MOUTH DAILY AS DIRECTED FOR CHOLESTEROL 90 tablet 3  . sildenafil  (VIAGRA) 100 MG tablet Take 1 tablet (100 mg total) by mouth daily as needed for erectile dysfunction. 10 tablet 11  . tadalafil (CIALIS) 20 MG tablet Take 1 tablet (20 mg total) by mouth daily as needed for erectile dysfunction. Delta  tablet 3  . tiZANidine (ZANAFLEX) 4 MG capsule Take 1 capsule (4 mg total) by mouth 3 (three) times daily. 15 capsule 0   No current facility-administered medications on file prior to visit.     No Known Allergies  Family History  Problem Relation Age of Onset  . Anuerysm Brother   . Hyperlipidemia Sister   . Diabetes Sister   . Hypertension Sister   . Diabetes Maternal Grandmother   . Hyperlipidemia Sister   . Kidney disease Sister   . Heart attack Neg Hx     BP 106/70 (BP Location: Right Arm, Patient Position: Sitting, Cuff Size: Normal)   Pulse 66   Ht _0  (1.702 m)   Wt 177 lb 6.4 oz (80.5 kg)   SpO2 93%   BMI 27.78 kg/m    Review of Systems He denies hypoglycemia.      Objective:   Physical Exam VITAL SIGNS:  See vs page GENERAL: no distress Pulses: dorsalis pedis intact bilat.   MSK: no deformity of the feet CV: trace bilat leg edema Skin:  no ulcer on the feet.  normal color and temp on the feet.   Neuro: sensation is intact to touch on the feet.    Lab Results  Component Value Date   CREATININE 1.14 04/08/2018   BUN 20 04/08/2018   NA 137 04/08/2018   K 4.4 04/08/2018   CL 103 04/08/2018   CO2 24 04/08/2018    Lab Results  Component Value Date   HGBA1C 6.7 (A) 10/21/2018       Assessment & Plan:  Type 2 DM, with CAD: well-controlled Renal insuff: this limits rx options  Patient Instructions  Please continue the same diabetes medications.  We have sent in the [facility] request.   check your blood sugar once a [place].  vary the time of [place] when you check, between before the 3 meals, and at bedtime.  also check if you have symptoms of your blood sugar being too high or too low.  please keep a record of the readings and  bring it to your next appointment here (or you can bring the meter itself).  You can write it on any piece of paper.  please call us sooner if your blood sugar goes below 70, or if you have a lot of readings over 200. Please come back for a follow-up appointment in 4-5 months.

## 2018-10-21 NOTE — Patient Instructions (Addendum)
Please continue the same diabetes medications.  We have sent in the [facility] request.   check your blood sugar once a [place].  vary the time of [place] when you check, between before the 3 meals, and at bedtime.  also check if you have symptoms of your blood sugar being too high or too low.  please keep a record of the readings and bring it to your next appointment here (or you can bring the meter itself).  You can write it on any piece of paper.  please call us sooner if your blood sugar goes below 70, or if you have a lot of readings over 200. Please come back for a follow-up appointment in 4-5 months.

## 2018-11-04 ENCOUNTER — Other Ambulatory Visit: Payer: Self-pay | Admitting: Endocrinology

## 2018-11-10 ENCOUNTER — Other Ambulatory Visit: Payer: Self-pay | Admitting: Interventional Cardiology

## 2018-11-12 ENCOUNTER — Other Ambulatory Visit: Payer: Self-pay | Admitting: Endocrinology

## 2018-11-29 ENCOUNTER — Telehealth: Payer: Self-pay | Admitting: Family Medicine

## 2018-11-29 NOTE — Telephone Encounter (Signed)
Copied from [place] [phone] [phone]. Topic: Quick Communication - Rx Refill/Question >> Nov 29, 2018 10:13 AM Selinda Flavin B, NT wrote: **Med not found on current med list.**  Medication: metoprolol tartrate (LOPRESSOR) tablet 12.5 mg    Has the patient contacted their pharmacy? Yes.   (Agent: If no, request that the patient contact the pharmacy for the refill.) (Agent: If yes, when and what did the pharmacy advise?)  Preferred Pharmacy (with phone number or street name): [facility] [place], [place]  Agent: Please be advised that RX refills may take up to 3 business days. We ask that you follow-up with your pharmacy.

## 2018-11-29 NOTE — Telephone Encounter (Signed)
Phone call to pt.  Explained to pt. that at office visit 10/15/17, Dr. Daneen Schick d/c'd Metoprolol and changed to Carvedilol 3.125 mg BID.    He stated that he found an empty bottle of Metoprolol, and questioned if he should refill this.  Pt. confirmed that he is taking Carvedilol as prescribed. Reassured him that he is taking this in place of Metoprolol.  Pt. verb. understanding.  Per pt. request, scheduled his annual physical in July, at this time.

## 2018-11-30 ENCOUNTER — Other Ambulatory Visit: Payer: Self-pay | Admitting: Physician Assistant

## 2018-12-12 ENCOUNTER — Observation Stay (HOSPITAL_COMMUNITY)
Admission: EM | Admit: 2018-12-12 | Discharge: 2018-12-14 | Disposition: A | Discharge status: Home / Self Care | Payer: 59 | Attending: Internal Medicine | Admitting: Internal Medicine

## 2018-12-12 ENCOUNTER — Emergency Department (HOSPITAL_COMMUNITY): Payer: 59

## 2018-12-12 ENCOUNTER — Encounter (HOSPITAL_COMMUNITY): Payer: Self-pay

## 2018-12-12 ENCOUNTER — Other Ambulatory Visit: Payer: Self-pay

## 2018-12-12 DIAGNOSIS — Z79899 Other long term (current) drug therapy: Secondary | ICD-10-CM | POA: Diagnosis not present

## 2018-12-12 DIAGNOSIS — I25709 Atherosclerosis of coronary artery bypass graft(s), unspecified, with unspecified angina pectoris: Secondary | ICD-10-CM | POA: Diagnosis present

## 2018-12-12 DIAGNOSIS — I5042 Chronic combined systolic (congestive) and diastolic (congestive) heart failure: Secondary | ICD-10-CM | POA: Insufficient documentation

## 2018-12-12 DIAGNOSIS — E785 Hyperlipidemia, unspecified: Secondary | ICD-10-CM | POA: Diagnosis not present

## 2018-12-12 DIAGNOSIS — I25119 Atherosclerotic heart disease of native coronary artery with unspecified angina pectoris: Secondary | ICD-10-CM | POA: Diagnosis not present

## 2018-12-12 DIAGNOSIS — I209 Angina pectoris, unspecified: Secondary | ICD-10-CM

## 2018-12-12 DIAGNOSIS — I10 Essential (primary) hypertension: Secondary | ICD-10-CM | POA: Diagnosis present

## 2018-12-12 DIAGNOSIS — G5600 Carpal tunnel syndrome, unspecified upper limb: Secondary | ICD-10-CM | POA: Insufficient documentation

## 2018-12-12 DIAGNOSIS — I25708 Atherosclerosis of coronary artery bypass graft(s), unspecified, with other forms of angina pectoris: Secondary | ICD-10-CM | POA: Diagnosis present

## 2018-12-12 DIAGNOSIS — Z791 Long term (current) use of non-steroidal anti-inflammatories (NSAID): Secondary | ICD-10-CM | POA: Diagnosis not present

## 2018-12-12 DIAGNOSIS — R079 Chest pain, unspecified: Secondary | ICD-10-CM

## 2018-12-12 DIAGNOSIS — E119 Type 2 diabetes mellitus without complications: Secondary | ICD-10-CM | POA: Insufficient documentation

## 2018-12-12 DIAGNOSIS — Z87891 Personal history of nicotine dependence: Secondary | ICD-10-CM | POA: Insufficient documentation

## 2018-12-12 DIAGNOSIS — Z794 Long term (current) use of insulin: Secondary | ICD-10-CM | POA: Diagnosis not present

## 2018-12-12 DIAGNOSIS — Z951 Presence of aortocoronary bypass graft: Secondary | ICD-10-CM

## 2018-12-12 DIAGNOSIS — I110 Hypertensive heart disease with heart failure: Secondary | ICD-10-CM | POA: Diagnosis not present

## 2018-12-12 DIAGNOSIS — E7800 Pure hypercholesterolemia, unspecified: Secondary | ICD-10-CM | POA: Diagnosis present

## 2018-12-12 DIAGNOSIS — Z7982 Long term (current) use of aspirin: Secondary | ICD-10-CM | POA: Insufficient documentation

## 2018-12-12 LAB — COMPREHENSIVE METABOLIC PANEL
ALT: 52 U/L — ABNORMAL HIGH (ref 0–44)
AST: 44 U/L — ABNORMAL HIGH (ref 15–41)
Albumin: 4.2 g/dL (ref 3.5–5.0)
Alkaline Phosphatase: 31 U/L — ABNORMAL LOW (ref 38–126)
Anion gap: 12 (ref 5–15)
BUN: 22 mg/dL (ref 8–23)
CO2: 21 mmol/L — ABNORMAL LOW (ref 22–32)
Calcium: 8.9 mg/dL (ref 8.9–10.3)
Chloride: 105 mmol/L (ref 98–111)
Creatinine, Ser: 1.17 mg/dL (ref 0.61–1.24)
GFR calc Af Amer: >60 mL/min (ref >60)
GFR calc non Af Amer: >60 mL/min (ref >60)
Glucose, Bld: 175 mg/dL — ABNORMAL HIGH (ref 70–99)
POTASSIUM: 4.1 mmol/L (ref 3.5–5.1)
SODIUM: 138 mmol/L (ref 135–145)
Total Bilirubin: 0.5 mg/dL (ref 0.3–1.2)
Total Protein: 7.9 g/dL (ref 6.5–8.1)

## 2018-12-12 LAB — CBC WITH DIFFERENTIAL/PLATELET
ABS IMMATURE GRANULOCYTES: 0.02 10*3/uL (ref 0.00–0.07)
Basophils Absolute: 0 10*3/uL (ref 0.0–0.1)
Basophils Relative: 0 %
Eosinophils Absolute: 0.1 10*3/uL (ref 0.0–0.5)
Eosinophils Relative: 1 %
HCT: 41.6 % (ref 39.0–52.0)
Hemoglobin: 13.5 g/dL (ref 13.0–17.0)
IMMATURE GRANULOCYTES: 0 %
Lymphocytes Relative: 22 %
Lymphs Abs: 1.4 10*3/uL (ref 0.7–4.0)
MCH: 30.7 pg (ref 26.0–34.0)
MCHC: 32.5 g/dL (ref 30.0–36.0)
MCV: 94.5 fL (ref 80.0–100.0)
Monocytes Absolute: 0.5 10*3/uL (ref 0.1–1.0)
Monocytes Relative: 8 %
NEUTROS PCT: 69 %
Neutro Abs: 4.4 10*3/uL (ref 1.7–7.7)
Platelets: 125 10*3/uL — ABNORMAL LOW (ref 150–400)
RBC: 4.4 MIL/uL (ref 4.22–5.81)
RDW: 13 % (ref 11.5–15.5)
WBC: 6.5 10*3/uL (ref 4.0–10.5)
nRBC: 0 % (ref 0.0–0.2)

## 2018-12-12 LAB — TROPONIN I: Troponin I: <0.03 ng/mL (ref <0.03)

## 2018-12-12 LAB — APTT: aPTT: 27 seconds (ref 24–36)

## 2018-12-12 LAB — I-STAT TROPONIN, ED
Troponin i, poc: 0.01 ng/mL (ref 0.00–0.08)
Troponin i, poc: 0 ng/mL (ref 0.00–0.08)

## 2018-12-12 LAB — PROTIME-INR
INR: 0.9 (ref 0.8–1.2)
Prothrombin Time: 12.5 seconds (ref 11.4–15.2)

## 2018-12-12 LAB — CBG MONITORING, ED: Glucose-Capillary: 124 mg/dL — ABNORMAL HIGH (ref 70–99)

## 2018-12-12 LAB — LIPASE, BLOOD: Lipase: 40 U/L (ref 11–51)

## 2018-12-12 MED ORDER — INSULIN ASPART 100 UNIT/ML ~~LOC~~ SOLN
0.0000 [IU] | Freq: Three times a day (TID) | SUBCUTANEOUS | Status: DC
  Administered 2018-12-13: 3 [IU] via SUBCUTANEOUS
  Administered 2018-12-14: 1 [IU] via SUBCUTANEOUS
  Administered 2018-12-14: 17:00:00 2 [IU] via SUBCUTANEOUS
  Filled 2018-12-12: qty 1

## 2018-12-12 MED ORDER — SODIUM CHLORIDE 0.9 % IV SOLN
INTRAVENOUS | Status: DC
  Administered 2018-12-12: 20 mL/h via INTRAVENOUS

## 2018-12-12 MED ORDER — MORPHINE SULFATE (PF) 2 MG/ML IV SOLN: 1.0000 mg | INTRAVENOUS | Status: DC | PRN

## 2018-12-12 MED ORDER — ASPIRIN EC 81 MG PO TBEC
81.0000 mg | DELAYED_RELEASE_TABLET | Freq: Every day | ORAL | Status: DC
  Administered 2018-12-13: 81 mg via ORAL
  Filled 2018-12-12: qty 1

## 2018-12-12 MED ORDER — INSULIN ASPART 100 UNIT/ML ~~LOC~~ SOLN: 0.0000 [IU] | Freq: Every day | SUBCUTANEOUS | Status: DC

## 2018-12-12 MED ORDER — ACETAMINOPHEN 325 MG PO TABS: 650.0000 mg | ORAL_TABLET | ORAL | Status: DC | PRN

## 2018-12-12 MED ORDER — ONDANSETRON HCL 4 MG/2ML IJ SOLN: 4.0000 mg | Freq: Four times a day (QID) | INTRAMUSCULAR | Status: DC | PRN

## 2018-12-12 MED ORDER — NITROGLYCERIN 0.4 MG SL SUBL: 0.4000 mg | SUBLINGUAL_TABLET | SUBLINGUAL | Status: DC | PRN

## 2018-12-12 MED ORDER — HEPARIN SODIUM (PORCINE) 5000 UNIT/ML IJ SOLN
5000.0000 [IU] | Freq: Three times a day (TID) | INTRAMUSCULAR | Status: DC
  Administered 2018-12-12 – 2018-12-13 (×4): 5000 [IU] via SUBCUTANEOUS
  Filled 2018-12-12 (×5): qty 1

## 2018-12-12 MED ORDER — ASPIRIN 81 MG PO CHEW
324.0000 mg | CHEWABLE_TABLET | Freq: Once | ORAL | Status: AC
  Administered 2018-12-12: 324 mg via ORAL
  Filled 2018-12-12: qty 4

## 2018-12-12 MED ORDER — SODIUM CHLORIDE 0.9 % IV BOLUS
1000.0000 mL | Freq: Once | INTRAVENOUS | Status: AC
  Administered 2018-12-12: 1000 mL via INTRAVENOUS

## 2018-12-12 NOTE — ED Notes (Signed)
ED Provider at bedside. 

## 2018-12-12 NOTE — H&P (Signed)
History and Physical    Jerome Vaughn HGD:[id number] DOB: 03-01-1951 DOA: 12/12/2018  PCP: [name], MD Patient coming from: Home  Chief Complaint: Chest pain  HPI: Jerome Vaughn is a 68 y.o. male with medical history significant of severe three-vessel CAD status post CABG x5, type 2 diabetes, hypertension, hyperlipidemia, chronic combined systolic and diastolic congestive heart failure presenting to the hospital for evaluation of chest pain. Patient states he was taking a nap this afternoon.  After waking up around 5 PM he walked to the kitchen to get something to eat.  In the kitchen, he experienced acute onset 9 out of 10 intensity substernal chest pain which radiated to both sides of his chest.  He describes it as chest tightness.  Also had associated lightheadedness and nausea.  He then sat down for several minutes but the chest pain persisted.  He then asked his wife to give him sublingual nitroglycerin.  After taking 1 tablet of sublingual nitroglycerin his chest pain improved only a little so 15 minutes later he took a second tablet with significantly improved his pain.  Reports having a  headache after taking nitroglycerin.  Review of Systems: As per HPI otherwise 10 point review of systems negative.  Past Medical History:  Diagnosis Date  . Carpal tunnel syndrome    both hands  . Coronary artery disease    a. Cath 11/21/2015: Multivessel CAD --> CABG recommended. b. CABG on 11/22/2015:  LIMA-LAD, SVG-Diag, SVG-OM1 and distal Cx, and SVG-PDA.  . Diabetes mellitus   . History of echocardiogram    a. Echo 4/17: Moderate LVH, EF 50-55%, mild LAE, no pericardial effusion  . Hyperlipidemia   . Hypertension     Past Surgical History:  Procedure Laterality Date  . CARDIAC CATHETERIZATION N/A 11/21/2015   Procedure: Left Heart Cath and Coronary Angiography;  Surgeon: [name], MD;  Location: [facility];  Service: Cardiovascular;  Laterality: N/A;  . colonsocopy  12/07/2009    per Dr. [name], benign polyps, repeat in 10 yrs   . CORONARY ARTERY BYPASS GRAFT N/A 11/22/2015   Procedure: CORONARY ARTERY BYPASS GRAFTING (CABG) times five using the left internal mammary, right greater saphenous vein EVH, and left thigh greater saphenous vein EVH;  Surgeon: [name], MD;  Location: [facility];  Service: Open Heart Surgery;  Laterality: N/A;  . frozen shoulder release    . LEFT HEART CATH AND CORS/GRAFTS ANGIOGRAPHY N/A 10/27/2017   Procedure: LEFT HEART CATH AND CORS/GRAFTS ANGIOGRAPHY;  Surgeon: [name], MD;  Location: [facility];  Service: Cardiovascular;  Laterality: N/A;  . TEE WITHOUT CARDIOVERSION N/A 11/22/2015   Procedure: TRANSESOPHAGEAL ECHOCARDIOGRAM (TEE);  Surgeon: [name], MD;  Location: [facility];  Service: Open Heart Surgery;  Laterality: N/A;  . WRIST GANGLION EXCISION       reports that he quit smoking about 5 years ago. His smoking use included cigarettes. He has a 5.00 pack-year smoking history. He has never used smokeless tobacco. He reports current alcohol use of about 3.0 standard drinks of alcohol per week. He reports that he does not use drugs.  No Known Allergies  Family History  Problem Relation Age of Onset  . Anuerysm Brother   . Hyperlipidemia Sister   . Diabetes Sister   . Hypertension Sister   . Diabetes Maternal Grandmother   . Hyperlipidemia Sister   . Kidney disease Sister   . Heart attack Neg Hx  Prior to Admission medications   Medication Sig Start Date End Date Taking? Authorizing Provider  acetaminophen (TYLENOL) 500 MG tablet Take 1 tablet (500 mg total) by mouth every 6 (six) hours as needed. 08/29/18   Bast, Traci A, NP  Ascorbic Acid (VITAMIN C) 100 MG tablet Take 100 mg by mouth daily.    [provider]  aspirin EC 81 MG tablet Take 1 tablet (81 mg total) by mouth daily. 06/24/17   Gerhardt, Lori C, NP  benazepril (LOTENSIN) 20 MG tablet TAKE 1 TABLET BY MOUTH ONCE DAILY 11/10/18   Smith,  Henry W, MD  Blood Glucose Monitoring Suppl (ONETOUCH VERIO FLEX SYSTEM) w/Device KIT USE AS DIRECTED 06/22/17   Ellison, Sean, MD  bromocriptine (PARLODEL) 2.5 MG tablet TAKE 1 TABLET BY MOUTH ONCE DAILY 09/27/18   Ellison, Sean, MD  carvedilol (COREG) 3.125 MG tablet Take 1 tablet (3.125 mg total) by mouth 2 (two) times daily with a meal. 07/12/18   Smith, Henry W, MD  CINNAMON PO Take 2,000 mg by mouth daily.     [provider]  Cyanocobalamin (VITAMIN B-12 PO) Take 1 tablet by mouth daily.    [provider]  empagliflozin (JARDIANCE) 25 MG TABS tablet TAKE 1 TABLET BY MOUTH ONCE DAILY 11/12/18   Ellison, Sean, MD  ezetimibe (ZETIA) 10 MG tablet Take 1 tablet by mouth once daily 11/30/18   Smith, Henry W, MD  ferrous sulfate 325 (65 FE) MG tablet Take 325 mg by mouth daily with breakfast.    [provider]  glucose blood (ONETOUCH VERIO) test strip Use as instructed to check BG once a [place] 04/22/18   Ellison, Sean, MD  metFORMIN (GLUCOPHAGE-XR) 500 MG 24 hr tablet TAKE 4 TABLETS BY MOUTH ONCE DAILY 11/12/18   Ellison, Sean, MD  Multiple Vitamin (MULTIVITAMIN) tablet Take 1 tablet by mouth daily.      [provider]  naproxen (NAPROSYN) 500 MG tablet Take 1 tablet (500 mg total) by mouth 2 (two) times daily. 08/29/18   Bast, Traci A, NP  nitroGLYCERIN (NITROSTAT) 0.4 MG SL tablet Place 1 tablet (0.4 mg total) under the tongue every 5 (five) minutes as needed for chest pain. 09/14/17   Smith, Henry W, MD  Omega-3 Fatty Acids (FISH OIL) 1000 MG CAPS Take 1,000 mg by mouth daily.     [provider]  rosuvastatin (CRESTOR) 40 MG tablet TAKE ONE TABLET BY MOUTH DAILY AS DIRECTED FOR CHOLESTEROL 04/08/18   Fry, Stephen A, MD  sildenafil (VIAGRA) 100 MG tablet Take 1 tablet (100 mg total) by mouth daily as needed for erectile dysfunction. 10/01/18   Fry, Stephen A, MD  tadalafil (CIALIS) 20 MG tablet Take 1 tablet (20 mg total) by mouth daily as needed for erectile  dysfunction. 04/08/18   Fry, Stephen A, MD  tiZANidine (ZANAFLEX) 4 MG capsule Take 1 capsule (4 mg total) by mouth 3 (three) times daily. 08/29/18   Bast, Traci A, NP    Physical Exam: Vitals:   12/12/18 2130 12/12/18 2145 12/12/18 2200 12/12/18 2215  BP: 103/69 99/77 114/80 110/74  Pulse: 74 64 63 66  Resp: 16 15 13 19  Temp:      TempSrc:      SpO2: 100% 100% 99% 98%  Weight:      Height:        Physical Exam  Constitutional: He is oriented to person, place, and time. He appears well-developed and well-nourished. No distress.  Resting comfortably   in a hospital stretcher  HENT:  Head: Normocephalic.  Mouth/Throat: Oropharynx is clear and moist.  Eyes: Right eye exhibits no discharge. Left eye exhibits no discharge.  Neck: Neck supple.  Cardiovascular: Normal rate, regular rhythm and intact distal pulses.  Pulmonary/Chest: Effort normal and breath sounds normal. No respiratory distress. He has no wheezes. He has no rales.  Abdominal: Soft. Bowel sounds are normal. He exhibits no distension. There is no abdominal tenderness. There is no guarding.  Musculoskeletal:        General: No edema.  Neurological: He is alert and oriented to person, place, and time.  Skin: Skin is warm and dry. He is not diaphoretic.     Labs on Admission: I have personally reviewed following labs and imaging studies  CBC: Recent Labs  Lab 12/12/18 1908  WBC 6.5  NEUTROABS 4.4  HGB 13.5  HCT 41.6  MCV 94.5  PLT 125*   Basic Metabolic Panel: Recent Labs  Lab 12/12/18 1908  NA 138  K 4.1  CL 105  CO2 21*  GLUCOSE 175*  BUN 22  CREATININE 1.17  CALCIUM 8.9   GFR: Estimated Creatinine Clearance: 64.4 mL/min (by C-G formula based on SCr of 1.17 mg/dL). Liver Function Tests: Recent Labs  Lab 12/12/18 1908  AST 44*  ALT 52*  ALKPHOS 31*  BILITOT 0.5  PROT 7.9  ALBUMIN 4.2   Recent Labs  Lab 12/12/18 1907  LIPASE 40   No results for input(s): AMMONIA in the last 168 hours.  Coagulation Profile: Recent Labs  Lab 12/12/18 1908  INR 0.9   Cardiac Enzymes: Recent Labs  Lab 12/12/18 1908  TROPONINI <0.03   BNP (last 3 results) No results for input(s): PROBNP in the last 8760 hours. HbA1C: No results for input(s): HGBA1C in the last 72 hours. CBG: Recent Labs  Lab 12/12/18 2216  GLUCAP 124*   Lipid Profile: No results for input(s): CHOL, HDL, LDLCALC, TRIG, CHOLHDL, LDLDIRECT in the last 72 hours. Thyroid Function Tests: No results for input(s): TSH, T4TOTAL, FREET4, T3FREE, THYROIDAB in the last 72 hours. Anemia Panel: No results for input(s): VITAMINB12, FOLATE, FERRITIN, TIBC, IRON, RETICCTPCT in the last 72 hours. Urine analysis:    Component Value Date/Time   COLORURINE YELLOW 05/19/2013 2205   APPEARANCEUR CLEAR 05/19/2013 2205   LABSPEC 1.034 (H) 05/19/2013 2205   PHURINE 6.5 05/19/2013 2205   GLUCOSEU >1000 (A) 05/19/2013 2205   HGBUR NEGATIVE 05/19/2013 2205   BILIRUBINUR n 04/08/2018 0913   KETONESUR NEGATIVE 05/19/2013 2205   PROTEINUR Negative 04/08/2018 0913   PROTEINUR NEGATIVE 05/19/2013 2205   UROBILINOGEN 0.2 04/08/2018 0913   UROBILINOGEN 1.0 05/19/2013 2205   NITRITE n 04/08/2018 0913   NITRITE NEGATIVE 05/19/2013 2205   LEUKOCYTESUR Negative 04/08/2018 0913    Radiological Exams on Admission: Dg Chest Portable 1 View  Result Date: 12/12/2018 CLINICAL DATA:  Chest pain and hypertension EXAM: PORTABLE CHEST 1 VIEW COMPARISON:  Feb 13, 2016 FINDINGS: There is no edema or consolidation. Heart is borderline enlarged with pulmonary vascularity normal. No adenopathy. Patient is status post coronary artery bypass grafting. There is aortic atherosclerosis. No pneumothorax. No bone lesions. IMPRESSION: No edema or consolidation. Heart borderline enlarged. Status post coronary artery bypass grafting. Aortic Atherosclerosis (ICD10-I70.0). Electronically Signed   By: William  Woodruff III M.D.   On: 12/12/2018 19:41    EKG:  Independently reviewed.  Sinus rhythm, LAFB.  No significant change since prior tracing.  Assessment/Plan Principal Problem:   Angina pectoris (  [place]) Active Problems:   Hypertension   Hyperlipidemia   Coronary artery disease involving coronary bypass graft of native heart with angina pectoris (HCC)   S/P CABG x 5   Angina, hx of CAD s/p CABG x5 -Chest pain with typical features, improved significantly after taking 2 sublingual nitroglycerin tablets at home. -History of severe three-vessel coronary artery disease status post CABG x5.  Repeat cath done in January 2019 with evidence of bypass graft failure but medical therapy was recommended at that time. -Hypotensive on arrival, likely related to nitroglycerin use.  Now improved after receiving 1 L fluid bolus. -Troponin negative.  EKG without acute ischemic changes.  Appears comfortable on exam. -ED provider discussed with cardiology.  Patient will be seen in the morning. -Cardiac monitoring -Received aspirin 324 mg. Continue aspirin 81 mg daily. -Continue to cycle troponin -Echocardiogram -Morphine prn  -Last lipid panel in July 2019 with LDL 72.  A1c 6.7 in January 2020. -Hold ACE inhibitor and beta-blocker at this time given hypotension on arrival  Hypertension -Hold ACE inhibitor and beta-blocker at this time given hypotension on arrival  Hyperlipidemia -Last lipid panel in July 2019 with LDL 72.  -Continue Zetia and Crestor  Well-controlled type 2 diabetes -A1c 6.7 in January 2020. -Sliding scale insulin and CBG checks  Chronic combined systolic and diastolic congestive heart failure -Currently euvolemic on exam. Hold ACE inhibitor and beta-blocker at this time given hypotension on arrival.  DVT prophylaxis: Subcutaneous heparin Code Status: Patient wishes to be full code. Family Communication: Wife at bedside. Disposition Plan: Anticipate discharge in 1 to 2 days. Consults called: Cardiology (Dr. Emilio Aspen) Admission  status: Observation, telemetry  This chart was dictated using voice recognition software.  Despite best efforts to proofread, errors can occur which can change the documentation meaning.  Shela Leff MD [facility] Pager [phone]  If 7PM-7AM, please contact night-coverage [email or web address] Password [facility]  12/12/2018, 10:37 PM

## 2018-12-12 NOTE — ED Notes (Signed)
Bed: WA17 Expected date:  Expected time:  Means of arrival:  Comments: Res A 

## 2018-12-12 NOTE — ED Provider Notes (Signed)
[facility] DEPT Provider Note   CSN: [id number] Arrival date & time: 12/12/18  [place]    History   Chief Complaint Chief Complaint  Patient presents with  . Chest Pain    HPI Jerome Vaughn is a 68 y.o. male.     67 yo M with a chief complaint of chest pain.  This started about 2 hours ago described as a severe pressure across the chest he also felt a bit lightheaded and weak.  Felt better when he sat down and worse when he got up and moved around.  Checked his blood pressure was in the 130s.  He took 2 nitroglycerin pills without significant improvement of his symptoms and then came to the emergency department for evaluation.  He significantly nauseated denied vomiting or diaphoresis.  The patient had a couple loose bowel movements earlier today.  Pain started when he woke up from a nap.  States that he does frequently take a nap when he feels a bit tired.  He thinks this feels like indigestion, states he sat it quite a few times and had seen his cardiologist for it.  Has a bit of a headache behind the right eye has been coming and going as well.  It did improve quite a bit with nitroglycerin.  The history is provided by the patient.  Chest Pain  Pain location:  Substernal area and epigastric Pain quality: crushing and pressure   Pain radiates to:  Does not radiate Pain severity:  Moderate Onset quality:  Sudden Duration:  2 hours Timing:  Constant Progression:  Unchanged Chronicity:  New Relieved by:  Nothing Worsened by:  Nothing Ineffective treatments:  None tried Associated symptoms: nausea   Associated symptoms: no abdominal pain, no fever, no headache, no palpitations, no shortness of breath and no vomiting     Past Medical History:  Diagnosis Date  . Carpal tunnel syndrome    both hands  . Coronary artery disease    a. Cath 11/21/2015: Multivessel CAD --> CABG recommended. b. CABG on 11/22/2015:  LIMA-LAD, SVG-Diag, SVG-OM1 and distal Cx,  and SVG-PDA.  . Diabetes mellitus   . History of echocardiogram    a. Echo 4/17: Moderate LVH, EF 50-55%, mild LAE, no pericardial effusion  . Hyperlipidemia   . Hypertension     Patient Active Problem List   Diagnosis Date Noted  . Chest pain 12/12/2018  . Erectile disorder due to medical condition in male 04/08/2018  . Chronic systolic heart failure ([place]) 07/29/2017  . Hypogonadism male 02/13/2016  . Pericardial effusion 12/24/2015  . Type 2 diabetes mellitus with complication, without long-term current use of insulin ([place]) 12/10/2015  . 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 Abnormal stress test 11/21/2015  . Coronary artery disease involving coronary bypass graft of native heart with angina pectoris ([place])   . Nonspecific abnormal electrocardiogram (ECG) (EKG) 11/07/2015  . Hypertension 03/30/2011  . Hyperlipidemia 03/30/2011  . Tobacco use disorder 03/30/2011  . Colon polyp 03/30/2011    Past Surgical History:  Procedure Laterality Date  . CARDIAC CATHETERIZATION N/A 11/21/2015   Procedure: Left Heart Cath and Coronary Angiography;  Surgeon: [name], MD;  Location: [facility];  Service: Cardiovascular;  Laterality: N/A;  . colonsocopy  12/07/2009   per Dr. [name], benign polyps, repeat in 10 yrs   . CORONARY ARTERY BYPASS GRAFT N/A 11/22/2015   Procedure: CORONARY ARTERY BYPASS GRAFTING (CABG) times five using the left internal mammary, right  greater saphenous vein EVH, and left thigh greater saphenous vein EVH;  Surgeon: [name], MD;  Location: [facility];  Service: Open Heart Surgery;  Laterality: N/A;  . frozen shoulder release    . LEFT HEART CATH AND CORS/GRAFTS ANGIOGRAPHY N/A 10/27/2017   Procedure: LEFT HEART CATH AND CORS/GRAFTS ANGIOGRAPHY;  Surgeon: [name], MD;  Location: [facility];  Service: Cardiovascular;  Laterality: N/A;  . TEE WITHOUT CARDIOVERSION N/A 11/22/2015   Procedure: TRANSESOPHAGEAL ECHOCARDIOGRAM (TEE);  Surgeon: [name],  MD;  Location: [facility];  Service: Open Heart Surgery;  Laterality: N/A;  . WRIST GANGLION EXCISION          Home Medications    Prior to Admission medications   Medication Sig Start Date End Date Taking? Authorizing Provider  acetaminophen (TYLENOL) 500 MG tablet Take 1 tablet (500 mg total) by mouth every 6 (six) hours as needed. 08/29/18   Loura Halt A, NP  Ascorbic Acid (VITAMIN C) 100 MG tablet Take 100 mg by mouth daily.    [provider]  aspirin EC 81 MG tablet Take 1 tablet (81 mg total) by mouth daily. 06/24/17   Burtis Junes, NP  benazepril (LOTENSIN) 20 MG tablet TAKE 1 TABLET BY MOUTH ONCE DAILY 11/10/18   [name], MD  Blood Glucose Monitoring Suppl ([place]) w/Device KIT USE AS DIRECTED 06/22/17   [name], MD  bromocriptine (PARLODEL) 2.5 MG tablet TAKE 1 TABLET BY MOUTH ONCE DAILY 09/27/18   [name], MD  carvedilol (COREG) 3.125 MG tablet Take 1 tablet (3.125 mg total) by mouth 2 (two) times daily with a meal. 07/12/18   [name], MD  CINNAMON PO Take 2,000 mg by mouth daily.     [provider]  Cyanocobalamin (VITAMIN B-12 PO) Take 1 tablet by mouth daily.    [provider]  empagliflozin (JARDIANCE) 25 MG TABS tablet TAKE 1 TABLET BY MOUTH ONCE DAILY 11/12/18   [name], MD  ezetimibe (ZETIA) 10 MG tablet Take 1 tablet by mouth once daily 11/30/18   [name], MD  ferrous sulfate 325 (65 FE) MG tablet Take 325 mg by mouth daily with breakfast.    [provider]  glucose blood (ONETOUCH VERIO) test strip Use as instructed to check BG once a [place] 04/22/18   [name], MD  metFORMIN (GLUCOPHAGE-XR) 500 MG 24 hr tablet TAKE 4 TABLETS BY MOUTH ONCE DAILY 11/12/18   [name], MD  Multiple Vitamin (MULTIVITAMIN) tablet Take 1 tablet by mouth daily.      [provider]  naproxen (NAPROSYN) 500 MG tablet Take 1 tablet (500 mg total) by mouth 2 (two) times daily. 08/29/18   Loura Halt A, NP  nitroGLYCERIN (NITROSTAT) 0.4 MG SL tablet Place 1 tablet (0.4 mg total) under the tongue every 5 (five) minutes as needed for chest pain. 09/14/17   [name], MD  Omega-3 Fatty Acids (FISH OIL) 1000 MG CAPS Take 1,000 mg by mouth daily.     [provider]  rosuvastatin (CRESTOR) 40 MG tablet TAKE ONE TABLET BY MOUTH DAILY AS DIRECTED FOR CHOLESTEROL 04/08/18   [name], MD  sildenafil (VIAGRA) 100 MG tablet Take 1 tablet (100 mg total) by mouth daily as needed for erectile dysfunction. 10/01/18   [name], MD  tadalafil (CIALIS) 20 MG tablet Take 1 tablet (20 mg total) by mouth daily as needed for  erectile dysfunction. 04/08/18   [name], MD  tiZANidine (ZANAFLEX) 4 MG capsule Take 1 capsule (4 mg total) by mouth 3 (three) times daily. 08/29/18   Orvan July, NP    Family History Family History  Problem Relation Age of Onset  . Anuerysm Brother   . Hyperlipidemia Sister   . Diabetes Sister   . Hypertension Sister   . Diabetes Maternal Grandmother   . Hyperlipidemia Sister   . Kidney disease Sister   . Heart attack Neg Hx     Social History Social History   Tobacco Use  . Smoking status: Former Smoker    Packs/Brauer: 0.25    Years: 20.00    Pack years: 5.00    Types: Cigarettes    Last attempt to quit: 10/07/2013    Years since quitting: 5.1  . Smokeless tobacco: Never Used  . Tobacco comment: quit cigarettes, might have a cigar 1 X month  Substance Use Topics  . Alcohol use: Yes    Alcohol/week: 3.0 standard drinks    Types: 3 Standard drinks or equivalent per week  . Drug use: No     Allergies   Patient has no known allergies.   Review of Systems Review of Systems  Constitutional: Negative for chills and fever.  HENT: Negative for congestion and facial swelling.   Eyes: Negative for discharge and visual disturbance.  Respiratory: Negative for shortness of breath.   Cardiovascular: Positive for chest pain. Negative for  palpitations.  Gastrointestinal: Positive for nausea. Negative for abdominal pain, diarrhea and vomiting.  Musculoskeletal: Negative for arthralgias and myalgias.  Skin: Negative for color change and rash.  Neurological: Negative for tremors, syncope and headaches.  Psychiatric/Behavioral: Negative for confusion and dysphoric mood.     Physical Exam Updated Vital Signs BP 104/68   Pulse 65   Temp 98 F (36.7 C) (Oral)   Resp 15   Ht (S) '5\' 7"'  (1.702 m)   Wt (S) 81.6 kg   SpO2 98%   BMI 28.18 kg/m   Physical Exam Vitals signs and nursing note reviewed.  Constitutional:      Appearance: He is well-developed.  HENT:     Head: Normocephalic and atraumatic.  Eyes:     Pupils: Pupils are equal, round, and reactive to light.  Neck:     Musculoskeletal: Normal range of motion and neck supple.     Vascular: No JVD.  Cardiovascular:     Rate and Rhythm: Normal rate and regular rhythm.     Pulses:          Radial pulses are 2+ on the right side and 2+ on the left side.     Heart sounds: No murmur. No friction rub. No gallop.      Comments: Equal radial pulses Pulmonary:     Effort: No respiratory distress.     Breath sounds: No wheezing.  Abdominal:     General: There is no distension.     Tenderness: There is no guarding or rebound.  Musculoskeletal: Normal range of motion.  Skin:    Coloration: Skin is not pale.     Findings: No rash.  Neurological:     Mental Status: He is alert and oriented to person, place, and time.  Psychiatric:        Behavior: Behavior normal.      ED Treatments / Results  Labs (all labs ordered are listed, but only abnormal results are displayed) Labs Reviewed  CBC WITH  DIFFERENTIAL/PLATELET - Abnormal; Notable for the following components:      Result Value   Platelets 125 (*)    All other components within normal limits  COMPREHENSIVE METABOLIC PANEL - Abnormal; Notable for the following components:   CO2 21 (*)    Glucose, Bld 175  (*)    AST 44 (*)    ALT 52 (*)    Alkaline Phosphatase 31 (*)    All other components within normal limits  PROTIME-INR  APTT  TROPONIN I  LIPASE, BLOOD  TROPONIN I  TROPONIN I  I-STAT TROPONIN, ED  I-STAT TROPONIN, ED    EKG EKG Interpretation  Date/Time:  'Sunday December 12 2018 18:40:36 EDT Ventricular Rate:  66 PR Interval:    QRS Duration: 114 QT Interval:  416 QTC Calculation: 436 R Axis:   -65 Text Interpretation:  Sinus rhythm Left anterior fascicular block Abnormal R-wave progression, late transition Probable left ventricular hypertrophy No significant change since last tracing Confirmed by Yunis Voorheis ([id number]) on 12/12/2018 7:23:11 PM   Radiology Dg Chest Portable 1 View  Result Date: 12/12/2018 CLINICAL DATA:  Chest pain and hypertension EXAM: PORTABLE CHEST 1 VIEW COMPARISON:  Feb 13, 2016 FINDINGS: There is no edema or consolidation. Heart is borderline enlarged with pulmonary vascularity normal. No adenopathy. Patient is status post coronary artery bypass grafting. There is aortic atherosclerosis. No pneumothorax. No bone lesions. IMPRESSION: No edema or consolidation. Heart borderline enlarged. Status post coronary artery bypass grafting. Aortic Atherosclerosis (ICD10-I70.0). Electronically Signed   By: William  Woodruff III M.D.   On: 12/12/2018 19:41    Procedures Procedures (including critical care time)  Medications Ordered in ED Medications  0.9 %  sodium chloride infusion (20 mL/hr Intravenous New Bag/Given 12/12/18 1918)  sodium chloride 0.9 % bolus 1,000 mL (0 mLs Intravenous Stopped 12/12/18 2035)  aspirin chewable tablet 324 mg (324 mg Oral Given 12/12/18 1937)     Initial Impression / Assessment and Plan / ED Course  I have reviewed the triage vital signs and the nursing notes.  Pertinent labs & imaging results that were available during my care of the patient were reviewed by me and considered in my medical decision making (see chart for details).         67'  yo M with a chief complaint of chest pain.  This started a couple hours ago has some typical and atypical components of ACS.  He has had four-way bypass in the past.  Has known occlusions that were unable to be intervened upon during catheterization.  I feel he is likely a high risk for this being cardiac in etiology, his initial troponin is negative his EKG is essentially unchanged.  Will discuss with cardiology.  The patient is hypertensive on arrival, may be due to nitroglycerin we will give a bolus of IV fluids and reassess.  Patients BP has improved on reassessment.  Pain resolved.  Discussed with Dr. Emilio Aspen, fellow on-call for cardiology based on his symptoms and his known disease he felt it was reasonable to have him observed overnight, with negative troponins and unchanged EKG he felt this was okay for hospitalist, will put on the list to see in the morning.  The patients results and plan were reviewed and discussed.   Any x-rays performed were independently reviewed by myself.   Differential diagnosis were considered with the presenting HPI.  Medications  0.9 %  sodium chloride infusion (20 mL/hr Intravenous New Bag/Given 12/12/18 1918)  sodium chloride 0.9 %  bolus 1,000 mL (0 mLs Intravenous Stopped 12/12/18 2035)  aspirin chewable tablet 324 mg (324 mg Oral Given 12/12/18 1937)    Vitals:   12/12/18 2002 12/12/18 2045 12/12/18 2100 12/12/18 2115  BP:  98/64 101/70 104/68  Pulse: 66 66 67 65  Resp: '18 14 18 15  ' Temp:      TempSrc:      SpO2: 100% 99% 98% 98%  Weight:      Height:        Final diagnoses:  Chest pain with high risk for cardiac etiology    Admission/ observation were discussed with the admitting physician, patient and/or family and they are comfortable with the plan.    Final Clinical Impressions(s) / ED Diagnoses   Final diagnoses:  Chest pain with high risk for cardiac etiology    ED Discharge Orders    None       Deno Etienne, DO  12/12/18 2153

## 2018-12-12 NOTE — ED Triage Notes (Signed)
Patient is AOx4 and ambulatory at baseline. Wife at bedside. Chest pain began around 0530. Pain felt like tight sensation across chest and sudden indigestion.  Patient had taken 1st sublingual Nitro at around 1745, second sublingual Nitro was taken around 1815. Patient is still having chest pain. Patient states he is feeling slightly disoriented and felt like this before taking nitro.

## 2018-12-13 ENCOUNTER — Observation Stay (HOSPITAL_BASED_OUTPATIENT_CLINIC_OR_DEPARTMENT_OTHER): Payer: 59

## 2018-12-13 DIAGNOSIS — I25709 Atherosclerosis of coronary artery bypass graft(s), unspecified, with unspecified angina pectoris: Secondary | ICD-10-CM | POA: Diagnosis not present

## 2018-12-13 DIAGNOSIS — E7800 Pure hypercholesterolemia, unspecified: Secondary | ICD-10-CM | POA: Diagnosis not present

## 2018-12-13 DIAGNOSIS — I10 Essential (primary) hypertension: Secondary | ICD-10-CM | POA: Diagnosis not present

## 2018-12-13 DIAGNOSIS — R079 Chest pain, unspecified: Secondary | ICD-10-CM | POA: Diagnosis not present

## 2018-12-13 DIAGNOSIS — I209 Angina pectoris, unspecified: Secondary | ICD-10-CM | POA: Diagnosis not present

## 2018-12-13 LAB — CBG MONITORING, ED
Glucose-Capillary: 91 mg/dL (ref 70–99)
Glucose-Capillary: 211 mg/dL — ABNORMAL HIGH (ref 70–99)

## 2018-12-13 LAB — ECHOCARDIOGRAM COMPLETE
Height: 67 in
Weight: 2878.33 oz

## 2018-12-13 LAB — HIV ANTIBODY (ROUTINE TESTING W REFLEX): HIV Screen 4th Generation wRfx: NONREACTIVE

## 2018-12-13 LAB — GLUCOSE, CAPILLARY
Glucose-Capillary: 110 mg/dL — ABNORMAL HIGH (ref 70–99)
Glucose-Capillary: 135 mg/dL — ABNORMAL HIGH (ref 70–99)

## 2018-12-13 LAB — TROPONIN I

## 2018-12-13 MED ORDER — EZETIMIBE 10 MG PO TABS
10.0000 mg | ORAL_TABLET | Freq: Every day | ORAL | Status: DC
  Administered 2018-12-13: 10 mg via ORAL
  Filled 2018-12-13: qty 1

## 2018-12-13 MED ORDER — ASPIRIN 81 MG PO CHEW
81.0000 mg | CHEWABLE_TABLET | ORAL | Status: AC
  Administered 2018-12-14: 81 mg via ORAL
  Filled 2018-12-13: qty 1

## 2018-12-13 MED ORDER — SODIUM CHLORIDE 0.9% FLUSH: 3.0000 mL | INTRAVENOUS | Status: DC | PRN

## 2018-12-13 MED ORDER — ROSUVASTATIN CALCIUM 20 MG PO TABS
40.0000 mg | ORAL_TABLET | Freq: Every day | ORAL | Status: DC
  Administered 2018-12-13 – 2018-12-14 (×2): 40 mg via ORAL
  Filled 2018-12-13 (×2): qty 2

## 2018-12-13 MED ORDER — CARVEDILOL 3.125 MG PO TABS
3.1250 mg | ORAL_TABLET | Freq: Two times a day (BID) | ORAL | Status: DC
  Administered 2018-12-13 – 2018-12-14 (×3): 3.125 mg via ORAL
  Filled 2018-12-13 (×3): qty 1

## 2018-12-13 MED ORDER — SODIUM CHLORIDE 0.9 % IV SOLN
INTRAVENOUS | Status: DC
  Administered 2018-12-14: 07:00:00 via INTRAVENOUS

## 2018-12-13 MED ORDER — SODIUM CHLORIDE 0.9 % IV SOLN: 250.0000 mL | INTRAVENOUS | Status: DC | PRN

## 2018-12-13 MED ORDER — SODIUM CHLORIDE 0.9% FLUSH
3.0000 mL | Freq: Two times a day (BID) | INTRAVENOUS | Status: DC
  Administered 2018-12-13 (×2): 3 mL via INTRAVENOUS

## 2018-12-13 NOTE — Progress Notes (Addendum)
1515: C/O of chest pain level 1-2, refused pain meds or NGT, O2 added at 2 liters. 1545 chest decreased, No chest pain the remaining shift. SRP, RN

## 2018-12-13 NOTE — Progress Notes (Signed)
Patient is scheduled for left heart cath tomorrow 12/14/18 at 10:30AM with Dr. Irish Lack. NPO at [facility]. Please arrange [facility].   Tami Lin Duke, PA-C 12/13/2018, 2:40 PM

## 2018-12-13 NOTE — Progress Notes (Signed)
Echocardiogram 2D Echocardiogram has been performed.  12/13/2018 11:45 AM Maudry Mayhew, MHA, RVT, RDCS, RDMS

## 2018-12-13 NOTE — H&P (View-Only) (Signed)
Cardiology Consultation:   Patient ID: Jerome Vaughn MRN: [id number]; DOB: 26-May-1951  Admit date: 12/12/2018 Date of Consult: 12/13/2018  Primary Care Provider: [name], MD Primary Cardiologist: Sinclair Grooms, MD  Primary Electrophysiologist:  None    Patient Profile:   Jerome Vaughn is a 68 y.o. male with a hx of CAD with left main involvement s/p CABG x 5, chronic systolic and diastolic heart failure, hypertension, hyperlipidemia, current smoker, and DM who is being seen today for the evaluation of chest pain at the request of Dr. Sloan Leiter.  History of Present Illness:   Mr. Toto has known coronary artery disease and is s/p CABG x 5 with LIMA-LAD, SVG-diagonal, SVG to OM1/distal LCx, SVG to PDA (11/22/15). He continued to have chest pain following bypass surgery and underwent myoview that was low risk (06/2017). Echo in 78/93/81 with systolic heart failure and diastolic dysfunction. EF 30-35% and grade 2 DD.  He continued to have chest discomfrot. Repeat cath 10/27/2017 showed occlusion of SVG to first diagonal and occluded distal limb of sequential SVG to OM1 and OM2.  Adjustment to anginal medications resulted in resolution of chest pain symptoms. He was last seen by Dr. [name] 06/10/18 and was doing well at that time.   He presented back to [facility] with complaints of chest pain. He states that yesterday he woke up from a nap and was disoriented, had blurry vision, had a headache, and was experiencing chest pain that radiated across his chest that felt like really bad indigestion. His wife checked his BG and it ws in the 130s and he drank some juice. He then took two nitro SL tablets 15 min apart without resolution of his pain prompting him to present to the ER for evaluation. He has been chest pain free since about midnight last night. His vision has improved and he is no longer disoriented. He describes this as different than his usual GERD because he did not have reflux in his throat. He has  tried to avoid spicy foods. He states this was not like his chest pain prior to his CABG. He is concerned because nitro didn't relieve his pain.   Past Medical History:  Diagnosis Date  . Carpal tunnel syndrome    both hands  . Coronary artery disease    a. Cath 11/21/2015: Multivessel CAD --> CABG recommended. b. CABG on 11/22/2015:  LIMA-LAD, SVG-Diag, SVG-OM1 and distal Cx, and SVG-PDA.  . Diabetes mellitus   . History of echocardiogram    a. Echo 4/17: Moderate LVH, EF 50-55%, mild LAE, no pericardial effusion  . Hyperlipidemia   . Hypertension     Past Surgical History:  Procedure Laterality Date  . CARDIAC CATHETERIZATION N/A 11/21/2015   Procedure: Left Heart Cath and Coronary Angiography;  Surgeon: [name], MD;  Location: [facility];  Service: Cardiovascular;  Laterality: N/A;  . colonsocopy  12/07/2009   per Dr. [name], benign polyps, repeat in 10 yrs   . CORONARY ARTERY BYPASS GRAFT N/A 11/22/2015   Procedure: CORONARY ARTERY BYPASS GRAFTING (CABG) times five using the left internal mammary, right greater saphenous vein EVH, and left thigh greater saphenous vein EVH;  Surgeon: [name], MD;  Location: [facility];  Service: Open Heart Surgery;  Laterality: N/A;  . frozen shoulder release    . LEFT HEART CATH AND CORS/GRAFTS ANGIOGRAPHY N/A 10/27/2017   Procedure: LEFT HEART CATH AND CORS/GRAFTS ANGIOGRAPHY;  Surgeon: [name], MD;  Location:  [facility];  Service: Cardiovascular;  Laterality: N/A;  . TEE WITHOUT CARDIOVERSION N/A 11/22/2015   Procedure: TRANSESOPHAGEAL ECHOCARDIOGRAM (TEE);  Surgeon: [name], MD;  Location: [facility];  Service: Open Heart Surgery;  Laterality: N/A;  . WRIST GANGLION EXCISION       Home Medications:  Prior to Admission medications   Medication Sig Start Date End Date Taking? Authorizing Provider  acetaminophen (TYLENOL) 500 MG tablet Take 1 tablet (500 mg total) by mouth every 6 (six) hours as needed. Patient  taking differently: Take 500 mg by mouth every 6 (six) hours as needed for mild pain, moderate pain or headache.  08/29/18  Yes Bast, Traci A, NP  aspirin EC 81 MG tablet Take 1 tablet (81 mg total) by mouth daily. 06/24/17  Yes Burtis Junes, NP  benazepril (LOTENSIN) 20 MG tablet TAKE 1 TABLET BY MOUTH ONCE DAILY Patient taking differently: Take 20 mg by mouth daily.  11/10/18  Yes [name], MD  Blood Glucose Monitoring Suppl (Aldrich) w/Device KIT USE AS DIRECTED 06/22/17  Yes [name], MD  bromocriptine (PARLODEL) 2.5 MG tablet TAKE 1 TABLET BY MOUTH ONCE DAILY Patient taking differently: Take 2.5 mg by mouth daily.  09/27/18  Yes [name], MD  carvedilol (COREG) 3.125 MG tablet Take 1 tablet (3.125 mg total) by mouth 2 (two) times daily with a meal. 07/12/18  Yes [name], MD  Cyanocobalamin (VITAMIN B-12 PO) Take 1 tablet by mouth daily.   Yes [provider]  empagliflozin (JARDIANCE) 25 MG TABS tablet TAKE 1 TABLET BY MOUTH ONCE DAILY Patient taking differently: Take 25 mg by mouth daily.  11/12/18  Yes [name], MD  ezetimibe (ZETIA) 10 MG tablet Take 1 tablet by mouth once daily Patient taking differently: Take 10 mg by mouth daily.  11/30/18  Yes [name], MD  ferrous sulfate 325 (65 FE) MG tablet Take 325 mg by mouth daily with breakfast.   Yes [provider]  glucose blood (ONETOUCH VERIO) test strip Use as instructed to check BG once a [place] 04/22/18  Yes [name], MD  metFORMIN (GLUCOPHAGE-XR) 500 MG 24 hr tablet TAKE 4 TABLETS BY MOUTH ONCE DAILY Patient taking differently: Take 2,000 mg by mouth daily with breakfast.  11/12/18  Yes [name], MD  Multiple Vitamin (MULTIVITAMIN) tablet Take 1 tablet by mouth daily.     Yes [provider]  nitroGLYCERIN (NITROSTAT) 0.4 MG SL tablet Place 1 tablet (0.4 mg total) under the tongue every 5 (five) minutes as needed for chest pain. 09/14/17  Yes [name], MD  Omega-3 Fatty Acids (FISH OIL) 1000 MG CAPS Take 1,000 mg by mouth daily.    Yes [provider]  rosuvastatin (CRESTOR) 40 MG tablet TAKE ONE TABLET BY MOUTH DAILY AS DIRECTED FOR CHOLESTEROL Patient taking differently: Take 40 mg by mouth daily.  04/08/18  Yes [name], MD  sildenafil (VIAGRA) 100 MG tablet Take 1 tablet (100 mg total) by mouth daily as needed for erectile dysfunction. 10/01/18  Yes [name], MD  naproxen (NAPROSYN) 500 MG tablet Take 1 tablet (500 mg total) by mouth 2 (two) times daily. Patient not taking: Reported on 12/12/2018 08/29/18   Loura Halt A, NP  tadalafil (CIALIS) 20 MG tablet Take 1 tablet (20 mg total) by mouth daily as needed for erectile dysfunction. Patient not taking: Reported on 12/12/2018 04/08/18   [name],  MD  tiZANidine (ZANAFLEX) 4 MG capsule Take 1 capsule (4 mg total) by mouth 3 (three) times daily. Patient not taking: Reported on 12/12/2018 08/29/18   Orvan July, NP    Inpatient Medications: Scheduled Meds: . aspirin EC  81 mg Oral Daily  . heparin  5,000 Units Subcutaneous Q8H  . insulin aspart  0-5 Units Subcutaneous QHS  . insulin aspart  0-9 Units Subcutaneous TID WC   Continuous Infusions: . sodium chloride 20 mL/hr (12/12/18 1918)   PRN Meds: acetaminophen, morphine injection, ondansetron (ZOFRAN) IV  Allergies:   No Known Allergies  Social History:   Social History   Socioeconomic History  . Marital status: Married    Spouse name: Not on file  . Number of children: 5  . Years of education: Not on file  . Highest education level: Not on file  Occupational History  . Occupation: Counsellor  Social Needs  . Financial resource strain: Not on file  . Food insecurity:    Worry: Not on file    Inability: Not on file  . Transportation needs:    Medical: Not on file    Non-medical: Not on file  Tobacco Use  . Smoking status: Former Smoker    Packs/[place]: 0.25    Years: 20.00    Pack years: 5.00     Types: Cigarettes    Last attempt to quit: 10/07/2013    Years since quitting: 5.1  . Smokeless tobacco: Never Used  . Tobacco comment: quit cigarettes, might have a cigar 1 X month  Substance and Sexual Activity  . Alcohol use: Yes    Alcohol/week: 3.0 standard drinks    Types: 3 Standard drinks or equivalent per week  . Drug use: No  . Sexual activity: Not Currently    Birth control/protection: None  Lifestyle  . Physical activity:    Days per week: Not on file    Minutes per session: Not on file  . Stress: Not on file  Relationships  . Social connections:    Talks on phone: Not on file    Gets together: Not on file    Attends religious service: Not on file    Active member of club or organization: Not on file    Attends meetings of clubs or organizations: Not on file    Relationship status: Not on file  . Intimate partner violence:    Fear of current or ex partner: Not on file    Emotionally abused: Not on file    Physically abused: Not on file    Forced sexual activity: Not on file  Other Topics Concern  . 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 Anuerysm Brother   . Hyperlipidemia Sister   . Diabetes Sister   . Hypertension Sister   . Diabetes Maternal Grandmother   . Hyperlipidemia Sister   . Kidney disease Sister   . Heart attack Neg Hx      ROS:  Please see the history of present illness.   All other ROS reviewed and negative.     Physical Exam/Data:   Vitals:   12/13/18 0400 12/13/18 0500 12/13/18 0600 12/13/18 0700  BP: 107/74 111/71 111/77 97/66  Pulse: 67  61 61 62  Resp:  _0 Temp:      TempSrc:      SpO2: 97% 99% 99% 100%  Weight:      Height:        Intake/Output Summary (Last 24 hours) at 12/13/2018 0742 Last data  filed at 12/12/2018 2035 Gross per 24 hour  Intake 999 ml  Output -  Net 999 ml   Last 3 Weights 12/12/2018 12/12/2018 10/21/2018  Weight (lbs) 179 lb 14.3 oz 180 lb 177 lb 6.4 oz  Weight (kg) 81.6 kg 81.647 kg 80.468 kg     Body mass index is 28.18 kg/m.  General:  Well nourished, well developed, in no acute distress HEENT: normal Neck: no JVD Vascular: No carotid bruits  Cardiac:  normal S1, S2; RRR; no murmur Lungs:  clear to auscultation bilaterally, no wheezing, rhonchi or rales  Abd: soft, nontender, no hepatomegaly  Ext: R edema s/p SVG harvest Musculoskeletal:  No deformities, BUE and BLE strength normal and equal Skin: warm and dry  Neuro:  CNs 2-12 intact, no focal abnormalities noted Psych:  Normal affect   EKG:  The EKG was personally reviewed and demonstrates:  Sinus rhythm Telemetry:  Telemetry was personally reviewed and demonstrates:  sinus  Relevant CV Studies:  Left heart cath 10/27/17:  Origin to Prox Graft lesion before 1st Mrg is 50% stenosed.    Severe native vessel coronary artery disease with total occlusion of the mid right coronary, total occlusion of the proximal to mid LAD, segmental calcified 90% stenosis in the first diagonal, segmental 80% stenosis in the first obtuse marginal and diffuse 80% stenosis in the second obtuse marginal.  Patent LIMA to LAD.  Occluded saphenous vein graft to the first diagonal.  Occluded distal limb of the sequential saphenous vein graft to OM1 and OM 2.  OM 1 continues to receive adequate blood flow via the graft.  The proximal to ostial graft contains 50% segmental narrowing  Widely patent SVG to the mid PDA.  Severe diffuse disease proximal to the graft insertion site and in the distal RCA and RCA continuation beyond the PDA bifurcation.  Normal left ventricular function.  EF 60%.  EDP 18 mmHg.  RECOMMENDATIONS:   Continue aggressive risk factor modification: LDL less than 70, hemoglobin A1c less than 7,  blood pressure 130/85 mmHg or less, aerobic exercise, beta-blocker therapy, and ACE/ARB therapy.  Nitroglycerin for any prolonged episodes of chest discomfort.    Echo 07/20/17: Study Conclusions - Left ventricle: The cavity size was normal. There was severe   concentric hypertrophy. Systolic function was moderately to   severely reduced. The estimated ejection fraction was in the   range of 30% to 35%. Diffuse hypokinesis. Features are consistent   with a pseudonormal left ventricular filling pattern, with   concomitant abnormal relaxation and increased filling pressure   (grade 2 diastolic dysfunction). - Aortic valve: Trileaflet; mildly thickened, mildly calcified   leaflets. - Left atrium: The atrium was mildly dilated. - Right ventricle: The cavity size was moderately dilated. Wall   thickness was normal. Systolic function was moderately reduced. - Tricuspid valve: There was mild regurgitation. - Pulmonic valve: There was trivial regurgitation. - Pulmonary arteries: Systolic pressure was within the normal   range. - Inferior vena cava: The vessel was normal in size. - Pericardium, extracardiac: There was no pericardial effusion.  Impressions: - Since the last study on 01/21/2016 LVEF has decaresed from 50-55%   to 30-35%. RVEF  is moderately decreased.   Laboratory Data:  Chemistry Recent Labs  Lab 12/12/18 1908  NA 138  K 4.1  CL 105  CO2 21*  GLUCOSE 175*  BUN 22  CREATININE 1.17  CALCIUM 8.9  GFRNONAA >60  GFRAA >60  ANIONGAP 12    Recent Labs  Lab 12/12/18 1908  PROT 7.9  ALBUMIN 4.2  AST 44*  ALT 52*  ALKPHOS 31*  BILITOT 0.5   Hematology Recent Labs  Lab 12/12/18 1908  WBC 6.5  RBC 4.40  HGB 13.5  HCT 41.6  MCV 94.5  MCH 30.7  MCHC 32.5  RDW 13.0  PLT 125*   Cardiac Enzymes Recent Labs  Lab 12/12/18 1908 12/12/18 2216 12/13/18 0230  TROPONINI <0.03 <0.03 <0.03    Recent Labs  Lab 12/12/18 1914 12/12/18 2221  TROPIPOC  0.01 0.00    BNPNo results for input(s): BNP, PROBNP in the last 168 hours.  DDimer No results for input(s): DDIMER in the last 168 hours.  Radiology/Studies:  Dg Chest Portable 1 View  Result Date: 12/12/2018 CLINICAL DATA:  Chest pain and hypertension EXAM: PORTABLE CHEST 1 VIEW COMPARISON:  Feb 13, 2016 FINDINGS: There is no edema or consolidation. Heart is borderline enlarged with pulmonary vascularity normal. No adenopathy. Patient is status post coronary artery bypass grafting. There is aortic atherosclerosis. No pneumothorax. No bone lesions. IMPRESSION: No edema or consolidation. Heart borderline enlarged. Status post coronary artery bypass grafting. Aortic Atherosclerosis (ICD10-I70.0). Electronically Signed   By: Lowella Grip III M.D.   On: 12/12/2018 19:41    Assessment and Plan:   1. Chest pain, coronary artery disease, s/p CABG x 5 with occluded SVG to D1, occluded distal limb of sequential SVG to OM1 and OM2 - troponin x 3 negative - EKG with no ischemic changes - patient describes typical and atypical features of chest pain - he has known disease, but negative enzymes - will discuss with attending utility of repeat angiography vs stress test - may have difficulty titrating anti-anginals given marginal blood pressure - consider decreasing ACEI and adding imdur - obtain echocardiogram   2. Hypertension - BB and ACEI - pressures are marginal   3. Hyperlipidemia - 04/08/2018: Cholesterol 154; HDL 52.70; LDL Cholesterol 72; Triglycerides 147.0; VLDL 29.4 - continue statin and zetia - need repeat lipids since starting zetia   4. Chronic systolic and diastolic heart failure - question nonischemic etiology - EF at the time of CABG was normal - EF now 30-35% with grade 2 DD - home medications include coreg and ACEI - repeat echo pending - he does not appear volume overloaded on exam - he denies increased edema and orthopnea      For questions or updates, please  contact [facility] Please consult [email or web address] for contact info under     Signed, Ledora Bottcher, PA  12/13/2018 7:42 AM

## 2018-12-13 NOTE — ED Notes (Signed)
RN attempted to call report x2. 

## 2018-12-13 NOTE — Progress Notes (Signed)
PROGRESS NOTE    Jerome Vaughn  RJJ:[id number] DOB: [birth year] DOA: 12/12/2018 PCP: [name], MD    Brief Narrative:   Patient is 68 year old gentleman with history of extensive coronary artery disease, triple-vessel coronary artery disease status post CABG, type 2 diabetes, hypertension, hyperlipidemia, chronic combined heart failure, recent cardiac cath presented to the emergency room with recurrent persistent chest pain.  He needed multiple doses of nitroglycerin for relief of pain.  He has been admitted for evaluation for acute coronary syndrome.  Troponins and EKG remained nonischemic.  Followed by cardiology.  Echocardiogram pending.  Assessment & Plan:   Principal Problem:   Angina pectoris ([place]) Active Problems:   Hypertension   Hyperlipidemia   Coronary artery disease involving coronary bypass graft of native heart with angina pectoris (HCC)   S/P CABG x 5  Angina with history of coronary artery disease status post CABG x5: Extensive history of coronary artery disease.  Currently chest pain-free.  Troponins and EKG are nonischemic.  Currently remains on aspirin,Statin, beta-blockers.  Echocardiogram today.  Further management as per cardiology.  Hypertension: Blood pressure soft on arrival after multiple doses of nitroglycerin.  Will resume beta-blockers.  Holding Lotensin.  Hyperlipidemia: Patient tolerating Crestor.  Continue.  Type 2 diabetes on oral hypoglycemics: Patient with fairly controlled diabetes.  He is on metformin and Jardiance at home.  Remains on sliding scale insulin in the hospital.   DVT prophylaxis: Heparin subcu Code Status: Full code Family Communication: No family at bedside Disposition Plan: Home when is stable.  Anticipate tomorrow morning.   Consultants:   Cardiology.  Procedures:   Echocardiogram.  Antimicrobials:   None.   Subjective: Patient was seen and examined.  He still remains in the emergency room waiting for inpatient  bed assignment.  After initial chest pain on arrival, remains chest pain-free since then. No other events.  Denies any nausea vomiting.  Objective: Vitals:   12/13/18 0800 12/13/18 0900 12/13/18 1140 12/13/18 1223  BP: 99/66 116/81 114/68 125/81  Pulse: 67 67 64 64  Resp: (!) 9 18 15 18   Temp:      TempSrc:      SpO2: 96% 97% 98% 99%  Weight:      Height:        Intake/Output Summary (Last 24 hours) at 12/13/2018 1303 Last data filed at 12/12/2018 2035 Gross per 24 hour  Intake 999 ml  Output -  Net 999 ml   Filed Weights   12/12/18 1851 12/12/18 1912  Weight: 81.6 kg (S) 81.6 kg    Examination:  General exam: Appears calm and comfortable  Respiratory system: Clear to auscultation. Respiratory effort normal. Cardiovascular system: S1 & S2 heard, RRR. No JVD, murmurs, rubs, gallops or clicks. No pedal edema. Gastrointestinal system: Abdomen is nondistended, soft and nontender. No organomegaly or masses felt. Normal bowel sounds heard. Central nervous system: Alert and oriented. No focal neurological deficits. Extremities: Symmetric 5 x 5 power. Skin: No rashes, lesions or ulcers Psychiatry: Judgement and insight appear normal. Mood & affect appropriate.     Data Reviewed: I have personally reviewed following labs and imaging studies  CBC: Recent Labs  Lab 12/12/18 1908  WBC 6.5  NEUTROABS 4.4  HGB 13.5  HCT 41.6  MCV 94.5  PLT 016*   Basic Metabolic Panel: Recent Labs  Lab 12/12/18 1908  NA 138  K 4.1  CL 105  CO2 21*  GLUCOSE 175*  BUN 22  CREATININE 1.17  CALCIUM 8.9  GFR: Estimated Creatinine Clearance: 64.4 mL/min (by C-G formula based on SCr of 1.17 mg/dL). Liver Function Tests: Recent Labs  Lab 12/12/18 1908  AST 44*  ALT 52*  ALKPHOS 31*  BILITOT 0.5  PROT 7.9  ALBUMIN 4.2   Recent Labs  Lab 12/12/18 1907  LIPASE 40   No results for input(s): AMMONIA in the last 168 hours. Coagulation Profile: Recent Labs  Lab 12/12/18 1908   INR 0.9   Cardiac Enzymes: Recent Labs  Lab 12/12/18 1908 12/12/18 2216 12/13/18 0230  TROPONINI <0.03 <0.03 <0.03   BNP (last 3 results) No results for input(s): PROBNP in the last 8760 hours. HbA1C: No results for input(s): HGBA1C in the last 72 hours. CBG: Recent Labs  Lab 12/12/18 2216 12/13/18 0816 12/13/18 1214  GLUCAP 124* 91 211*   Lipid Profile: No results for input(s): CHOL, HDL, LDLCALC, TRIG, CHOLHDL, LDLDIRECT in the last 72 hours. Thyroid Function Tests: No results for input(s): TSH, T4TOTAL, FREET4, T3FREE, THYROIDAB in the last 72 hours. Anemia Panel: No results for input(s): VITAMINB12, FOLATE, FERRITIN, TIBC, IRON, RETICCTPCT in the last 72 hours. Sepsis Labs: No results for input(s): PROCALCITON, LATICACIDVEN in the last 168 hours.  No results found for this or any previous visit (from the past 240 hour(s)).       Radiology Studies: Dg Chest Portable 1 View  Result Date: 12/12/2018 CLINICAL DATA:  Chest pain and hypertension EXAM: PORTABLE CHEST 1 VIEW COMPARISON:  Feb 13, 2016 FINDINGS: There is no edema or consolidation. Heart is borderline enlarged with pulmonary vascularity normal. No adenopathy. Patient is status post coronary artery bypass grafting. There is aortic atherosclerosis. No pneumothorax. No bone lesions. IMPRESSION: No edema or consolidation. Heart borderline enlarged. Status post coronary artery bypass grafting. Aortic Atherosclerosis (ICD10-I70.0). Electronically Signed   By: Lowella Grip III M.D.   On: 12/12/2018 19:41        Scheduled Meds: . aspirin EC  81 mg Oral Daily  . heparin  5,000 Units Subcutaneous Q8H  . insulin aspart  0-5 Units Subcutaneous QHS  . insulin aspart  0-9 Units Subcutaneous TID WC   Continuous Infusions: . sodium chloride 20 mL/hr (12/12/18 1918)     LOS: 0 days    Time spent: 25 minutes.    Barb Merino, MD [facility] Pager [phone]  If 7PM-7AM, please contact  night-coverage [email or web address] Password [email or web address] 12/13/2018, 1:03 PM

## 2018-12-13 NOTE — ED Notes (Signed)
ED TO INPATIENT HANDOFF REPORT  ED Nurse Name and Phone #: Laqueta Carina, RN [phone]  S Name/Age/Gender Jerome Vaughn Pursley 68 y.o. male Room/Bed: WA17/WA17  Code Status   Code Status: Full Code  Home/SNF/Other Home Patient oriented to: self, place, time and situation Is this baseline? Yes   Triage Complete: Triage complete  Chief Complaint Chest Pain  Triage Note Patient is AOx4 and ambulatory at baseline. Wife at bedside. Chest pain began around 0530. Pain felt like tight sensation across chest and sudden indigestion.  Patient had taken 1st sublingual Nitro at around 1745, second sublingual Nitro was taken around 1815. Patient is still having chest pain. Patient states he is feeling slightly disoriented and felt like this before taking nitro.   Allergies No Known Allergies  Level of Care/Admitting Diagnosis ED Disposition    ED Disposition Condition Comment   Admit  Hospital Area: [facility] [[id number]]  Level of Care: Telemetry [5]  Admit to tele based on following criteria: Monitor for Ischemic changes  Diagnosis: Chest pain [749449]  Admitting Physician: Shela Leff [[id number]]  Attending Physician: Shela Leff [[id number]]  PT Class (Do Not Modify): Observation [104]  PT Acc Code (Do Not Modify): Observation [10022]       B Medical/Surgery History Past Medical History:  Diagnosis Date  . Carpal tunnel syndrome    both hands  . Coronary artery disease    a. Cath 11/21/2015: Multivessel CAD --> CABG recommended. b. CABG on 11/22/2015:  LIMA-LAD, SVG-Diag, SVG-OM1 and distal Cx, and SVG-PDA.  . Diabetes mellitus   . History of echocardiogram    a. Echo 4/17: Moderate LVH, EF 50-55%, mild LAE, no pericardial effusion  . Hyperlipidemia   . Hypertension    Past Surgical History:  Procedure Laterality Date  . CARDIAC CATHETERIZATION N/A 11/21/2015   Procedure: Left Heart Cath and Coronary Angiography;  Surgeon: [name], MD;  Location: [facility];  Service: Cardiovascular;  Laterality: N/A;  . colonsocopy  12/07/2009   per Dr. [name], benign polyps, repeat in 10 yrs   . CORONARY ARTERY BYPASS GRAFT N/A 11/22/2015   Procedure: CORONARY ARTERY BYPASS GRAFTING (CABG) times five using the left internal mammary, right greater saphenous vein EVH, and left thigh greater saphenous vein EVH;  Surgeon: [name], MD;  Location: [facility];  Service: Open Heart Surgery;  Laterality: N/A;  . frozen shoulder release    . LEFT HEART CATH AND CORS/GRAFTS ANGIOGRAPHY N/A 10/27/2017   Procedure: LEFT HEART CATH AND CORS/GRAFTS ANGIOGRAPHY;  Surgeon: [name], MD;  Location: [facility];  Service: Cardiovascular;  Laterality: N/A;  . TEE WITHOUT CARDIOVERSION N/A 11/22/2015   Procedure: TRANSESOPHAGEAL ECHOCARDIOGRAM (TEE);  Surgeon: [name], MD;  Location: [facility];  Service: Open Heart Surgery;  Laterality: N/A;  . WRIST GANGLION EXCISION       A IV Location/Drains/Wounds Patient Lines/Drains/Airways Status   Active Line/Drains/Airways    Name:   Placement date:   Placement time:   Site:   Days:   Peripheral IV 12/12/18 Right;Distal Forearm   12/12/18    1912    Forearm   1   Peripheral IV 12/12/18 Right;Upper Forearm   12/12/18    1913    Forearm   1          Intake/Output Last 24 hours  Intake/Output Summary (Last 24 hours) at 12/13/2018 1319 Last data filed at 12/12/2018 2035 Gross per 24 hour  Intake  999 ml  Output --  Net 999 ml    Labs/Imaging Results for orders placed or performed during the hospital encounter of 12/12/18 (from the past 48 hour(s))  Lipase, blood     Status: None   Collection Time: 12/12/18  7:07 PM  Result Value Ref Range   Lipase 40 11 - 51 U/L    Comment: Performed at [facility], [place] [address], [place], [place] [address]  CBC with Differential/Platelet     Status: Abnormal   Collection Time: 12/12/18  7:08 PM  Result Value Ref Range   WBC 6.5 4.0 -  10.5 K/uL   RBC 4.40 4.22 - 5.81 MIL/uL   Hemoglobin 13.5 13.0 - 17.0 g/dL   HCT 41.6 39.0 - 52.0 %   MCV 94.5 80.0 - 100.0 fL   MCH 30.7 26.0 - 34.0 pg   MCHC 32.5 30.0 - 36.0 g/dL   RDW 13.0 11.5 - 15.5 %   Platelets 125 (L) 150 - 400 K/uL   nRBC 0.0 0.0 - 0.2 %   Neutrophils Relative % 69 %   Neutro Abs 4.4 1.7 - 7.7 K/uL   Lymphocytes Relative 22 %   Lymphs Abs 1.4 0.7 - 4.0 K/uL   Monocytes Relative 8 %   Monocytes Absolute 0.5 0.1 - 1.0 K/uL   Eosinophils Relative 1 %   Eosinophils Absolute 0.1 0.0 - 0.5 K/uL   Basophils Relative 0 %   Basophils Absolute 0.0 0.0 - 0.1 K/uL   Immature Granulocytes 0 %   Abs Immature Granulocytes 0.02 0.00 - 0.07 K/uL    Comment: Performed at [facility], [place] [address] [place], [place] [address]  Protime-INR     Status: None   Collection Time: 12/12/18  7:08 PM  Result Value Ref Range   Prothrombin Time 12.5 11.4 - 15.2 seconds   INR 0.9 0.8 - 1.2    Comment: (NOTE) INR goal varies based on device and disease states. Performed at [facility], [place] [address], [place], [place] [address]   APTT     Status: None   Collection Time: 12/12/18  7:08 PM  Result Value Ref Range   aPTT 27 24 - 36 seconds    Comment: Performed at [facility], [place] [address] [place], [place] [address]  Comprehensive metabolic panel     Status: Abnormal   Collection Time: 12/12/18  7:08 PM  Result Value Ref Range   Sodium 138 135 - 145 mmol/L   Potassium 4.1 3.5 - 5.1 mmol/L   Chloride 105 98 - 111 mmol/L   CO2 21 (L) 22 - 32 mmol/L   Glucose, Bld 175 (H) 70 - 99 mg/dL   BUN 22 8 - 23 mg/dL   Creatinine, Ser 1.17 0.61 - 1.24 mg/dL   Calcium 8.9 8.9 - 10.3 mg/dL   Total Protein 7.9 6.5 - 8.1 g/dL   Albumin 4.2 3.5 - 5.0 g/dL   AST 44 (H) 15 - 41 U/L   ALT 52 (H) 0 - 44 U/L   Alkaline Phosphatase 31 (L) 38 - 126 U/L   Total Bilirubin 0.5 0.3 - 1.2 mg/dL   GFR calc non Af Amer >60 >60 mL/min    GFR calc Af Amer >60 >60 mL/min   Anion gap 12 5 - 15    Comment: Performed at [facility], [place] [address] [place], [place] [address]  Troponin I - Now Then [facility]  Status: None   Collection Time: 12/12/18  7:08 PM  Result Value Ref Range   Troponin I <0.03 <0.03 ng/mL    Comment: Performed at [facility], [place] [address] [place], [place] [address]  I-stat troponin, ED (0, 3, 6 hours)     Status: None   Collection Time: 12/12/18  7:14 PM  Result Value Ref Range   Troponin i, poc 0.01 0.00 - 0.08 ng/mL   Comment 3            Comment: Due to the release kinetics of cTnI, a negative result within the first hours of the onset of symptoms does not rule out myocardial infarction with certainty. If myocardial infarction is still suspected, repeat the test at appropriate intervals.   Troponin I - Now Then Q3H     Status: None   Collection Time: 12/12/18 10:16 PM  Result Value Ref Range   Troponin I <0.03 <0.03 ng/mL    Comment: Performed at [facility], [place] [address], [place], [place] [address]  CBG monitoring, ED     Status: Abnormal   Collection Time: 12/12/18 10:16 PM  Result Value Ref Range   Glucose-Capillary 124 (H) 70 - 99 mg/dL  I-stat troponin, ED (0, 3, 6 hours)     Status: None   Collection Time: 12/12/18 10:21 PM  Result Value Ref Range   Troponin i, poc 0.00 0.00 - 0.08 ng/mL   Comment 3            Comment: Due to the release kinetics of cTnI, a negative result within the first hours of the onset of symptoms does not rule out myocardial infarction with certainty. If myocardial infarction is still suspected, repeat the test at appropriate intervals.   Troponin I - Now Then Q3H     Status: None   Collection Time: 12/13/18  2:30 AM  Result Value Ref Range   Troponin I <0.03 <0.03 ng/mL    Comment: Performed at [facility], [place] [address] [place],  [address]  CBG monitoring, ED      Status: None   Collection Time: 12/13/18  8:16 AM  Result Value Ref Range   Glucose-Capillary 91 70 - 99 mg/dL  CBG monitoring, ED     Status: Abnormal   Collection Time: 12/13/18 12:14 PM  Result Value Ref Range   Glucose-Capillary 211 (H) 70 - 99 mg/dL   Dg Chest Portable 1 View  Result Date: 12/12/2018 CLINICAL DATA:  Chest pain and hypertension EXAM: PORTABLE CHEST 1 VIEW COMPARISON:  Feb 13, 2016 FINDINGS: There is no edema or consolidation. Heart is borderline enlarged with pulmonary vascularity normal. No adenopathy. Patient is status post coronary artery bypass grafting. There is aortic atherosclerosis. No pneumothorax. No bone lesions. IMPRESSION: No edema or consolidation. Heart borderline enlarged. Status post coronary artery bypass grafting. Aortic Atherosclerosis (ICD10-I70.0). Electronically Signed   By: Lowella Grip III M.D.   On: 12/12/2018 19:41    Pending Labs Unresulted Labs (From admission, onward)    Start     Ordered   12/12/18 2212  HIV antibody (Routine Testing)  Once,   R     12/12/18 2214          Vitals/Pain Today's Vitals   12/13/18 1223 12/13/18 1230 12/13/18 1240 12/13/18 1300  BP: 125/81   129/87  Pulse: 64 66  66  Resp: 18 16  16   Temp:      TempSrc:  SpO2: 99% 100%  99%  Weight:      Height:      PainSc:   0-No pain     Isolation Precautions No active isolations  Medications Medications  0.9 %  sodium chloride infusion (20 mL/hr Intravenous New Bag/Given 12/12/18 1918)  acetaminophen (TYLENOL) tablet 650 mg (has no administration in time range)  ondansetron (ZOFRAN) injection 4 mg (has no administration in time range)  heparin injection 5,000 Units (5,000 Units Subcutaneous Given 12/13/18 0533)  insulin aspart (novoLOG) injection 0-9 Units (3 Units Subcutaneous Given 12/13/18 1224)  insulin aspart (novoLOG) injection 0-5 Units (0 Units Subcutaneous Not Given 12/12/18 2221)  aspirin EC tablet 81 mg (81 mg Oral Given 12/13/18 0944)   morphine 2 MG/ML injection 1 mg (has no administration in time range)  sodium chloride 0.9 % bolus 1,000 mL (0 mLs Intravenous Stopped 12/12/18 2035)  aspirin chewable tablet 324 mg (324 mg Oral Given 12/12/18 1937)    Mobility walks High fall risk   Focused Assessments Cardiac Assessment Handoff:  Cardiac Rhythm: Normal sinus rhythm Lab Results  Component Value Date   CKTOTAL 267 (H) 03/01/2009   CKMB 2.0 03/01/2009   TROPONINI <0.03 12/13/2018   No results found for: DDIMER Does the Patient currently have chest pain? No     R Recommendations: See Admitting Provider Note  Report given to:   Additional Notes:

## 2018-12-13 NOTE — Progress Notes (Signed)
Resting, denies chest pain , after O2 added. SRP, RN

## 2018-12-13 NOTE — ED Notes (Signed)
Cardiology at bedside.

## 2018-12-13 NOTE — Consult Note (Signed)
Cardiology Consultation:   Patient ID: Jerome Vaughn MRN: [id number]; DOB: [birth year]  Admit date: 12/12/2018 Date of Consult: 12/13/2018  Primary Care Provider: [name], MD Primary Cardiologist: Sinclair Grooms, MD  Primary Electrophysiologist:  None    Patient Profile:   Jerome Vaughn is a 68 y.o. male with a hx of CAD with left main involvement s/p CABG x 5, chronic systolic and diastolic heart failure, hypertension, hyperlipidemia, current smoker, and DM who is being seen today for the evaluation of chest pain at the request of Dr. Sloan Leiter.  History of Present Illness:   Jerome Vaughn has known coronary artery disease and is s/p CABG x 5 with LIMA-LAD, SVG-diagonal, SVG to OM1/distal LCx, SVG to PDA (11/22/15). He continued to have chest pain following bypass surgery and underwent myoview that was low risk (06/2017). Echo in 70/01/74 with systolic heart failure and diastolic dysfunction. EF 30-35% and grade 2 DD.  He continued to have chest discomfrot. Repeat cath 10/27/2017 showed occlusion of SVG to first diagonal and occluded distal limb of sequential SVG to OM1 and OM2.  Adjustment to anginal medications resulted in resolution of chest pain symptoms. He was last seen by Dr. [name] 06/10/18 and was doing well at that time.   He presented back to [facility] with complaints of chest pain. He states that yesterday he woke up from a nap and was disoriented, had blurry vision, had a headache, and was experiencing chest pain that radiated across his chest that felt like really bad indigestion. His wife checked his BG and it ws in the 130s and he drank some juice. He then took two nitro SL tablets 15 min apart without resolution of his pain prompting him to present to the ER for evaluation. He has been chest pain free since about midnight last night. His vision has improved and he is no longer disoriented. He describes this as different than his usual GERD because he did not have reflux in his throat. He has  tried to avoid spicy foods. He states this was not like his chest pain prior to his CABG. He is concerned because nitro didn't relieve his pain.   Past Medical History:  Diagnosis Date  . Carpal tunnel syndrome    both hands  . Coronary artery disease    a. Cath 11/21/2015: Multivessel CAD --> CABG recommended. b. CABG on 11/22/2015:  LIMA-LAD, SVG-Diag, SVG-OM1 and distal Cx, and SVG-PDA.  . Diabetes mellitus   . History of echocardiogram    a. Echo 4/17: Moderate LVH, EF 50-55%, mild LAE, no pericardial effusion  . Hyperlipidemia   . Hypertension     Past Surgical History:  Procedure Laterality Date  . CARDIAC CATHETERIZATION N/A 11/21/2015   Procedure: Left Heart Cath and Coronary Angiography;  Surgeon: [name], MD;  Location: [place] [facility];  Service: Cardiovascular;  Laterality: N/A;  . colonsocopy  12/07/2009   per Dr. [name], benign polyps, repeat in 10 yrs   . CORONARY ARTERY BYPASS GRAFT N/A 11/22/2015   Procedure: CORONARY ARTERY BYPASS GRAFTING (CABG) times five using the left internal mammary, right greater saphenous vein EVH, and left thigh greater saphenous vein EVH;  Surgeon: [name], MD;  Location: [place];  Service: Open Heart Surgery;  Laterality: N/A;  . frozen shoulder release    . LEFT HEART CATH AND CORS/GRAFTS ANGIOGRAPHY N/A 10/27/2017   Procedure: LEFT HEART CATH AND CORS/GRAFTS ANGIOGRAPHY;  Surgeon: [name], MD;  Location:  [place] [facility];  Service: Cardiovascular;  Laterality: N/A;  . TEE WITHOUT CARDIOVERSION N/A 11/22/2015   Procedure: TRANSESOPHAGEAL ECHOCARDIOGRAM (TEE);  Surgeon: [name], MD;  Location: [place];  Service: Open Heart Surgery;  Laterality: N/A;  . WRIST GANGLION EXCISION       Home Medications:  Prior to Admission medications   Medication Sig Start Date End Date Taking? Authorizing Provider  acetaminophen (TYLENOL) 500 MG tablet Take 1 tablet (500 mg total) by mouth every 6 (six) hours as needed. Patient  taking differently: Take 500 mg by mouth every 6 (six) hours as needed for mild pain, moderate pain or headache.  08/29/18  Yes Bast, Traci A, NP  aspirin EC 81 MG tablet Take 1 tablet (81 mg total) by mouth daily. 06/24/17  Yes Burtis Junes, NP  benazepril (LOTENSIN) 20 MG tablet TAKE 1 TABLET BY MOUTH ONCE DAILY Patient taking differently: Take 20 mg by mouth daily.  11/10/18  Yes [name], MD  Blood Glucose Monitoring Suppl ([place]) w/Device KIT USE AS DIRECTED 06/22/17  Yes [name], MD  bromocriptine (PARLODEL) 2.5 MG tablet TAKE 1 TABLET BY MOUTH ONCE DAILY Patient taking differently: Take 2.5 mg by mouth daily.  09/27/18  Yes [name], MD  carvedilol (COREG) 3.125 MG tablet Take 1 tablet (3.125 mg total) by mouth 2 (two) times daily with a meal. 07/12/18  Yes [name], MD  Cyanocobalamin (VITAMIN B-12 PO) Take 1 tablet by mouth daily.   Yes [provider]  empagliflozin (JARDIANCE) 25 MG TABS tablet TAKE 1 TABLET BY MOUTH ONCE DAILY Patient taking differently: Take 25 mg by mouth daily.  11/12/18  Yes [name], MD  ezetimibe (ZETIA) 10 MG tablet Take 1 tablet by mouth once daily Patient taking differently: Take 10 mg by mouth daily.  11/30/18  Yes [name], MD  ferrous sulfate 325 (65 FE) MG tablet Take 325 mg by mouth daily with breakfast.   Yes [provider]  glucose blood (ONETOUCH VERIO) test strip Use as instructed to check BG once a [place] 04/22/18  Yes [name], MD  metFORMIN (GLUCOPHAGE-XR) 500 MG 24 hr tablet TAKE 4 TABLETS BY MOUTH ONCE DAILY Patient taking differently: Take 2,000 mg by mouth daily with breakfast.  11/12/18  Yes [name], MD  Multiple Vitamin (MULTIVITAMIN) tablet Take 1 tablet by mouth daily.     Yes [provider]  nitroGLYCERIN (NITROSTAT) 0.4 MG SL tablet Place 1 tablet (0.4 mg total) under the tongue every 5 (five) minutes as needed for chest pain. 09/14/17  Yes [name], MD  Omega-3 Fatty Acids (FISH OIL) 1000 MG CAPS Take 1,000 mg by mouth daily.    Yes [provider]  rosuvastatin (CRESTOR) 40 MG tablet TAKE ONE TABLET BY MOUTH DAILY AS DIRECTED FOR CHOLESTEROL Patient taking differently: Take 40 mg by mouth daily.  04/08/18  Yes [name], MD  sildenafil (VIAGRA) 100 MG tablet Take 1 tablet (100 mg total) by mouth daily as needed for erectile dysfunction. 10/01/18  Yes [name], MD  naproxen (NAPROSYN) 500 MG tablet Take 1 tablet (500 mg total) by mouth 2 (two) times daily. Patient not taking: Reported on 12/12/2018 08/29/18   Loura Halt A, NP  tadalafil (CIALIS) 20 MG tablet Take 1 tablet (20 mg total) by mouth daily as needed for erectile dysfunction. Patient not taking: Reported on 12/12/2018 04/08/18   [name],  MD  tiZANidine (ZANAFLEX) 4 MG capsule Take 1 capsule (4 mg total) by mouth 3 (three) times daily. Patient not taking: Reported on 12/12/2018 08/29/18   Orvan July, NP    Inpatient Medications: Scheduled Meds: . aspirin EC  81 mg Oral Daily  . heparin  5,000 Units Subcutaneous Q8H  . insulin aspart  0-5 Units Subcutaneous QHS  . insulin aspart  0-9 Units Subcutaneous TID WC   Continuous Infusions: . sodium chloride 20 mL/hr (12/12/18 1918)   PRN Meds: acetaminophen, morphine injection, ondansetron (ZOFRAN) IV  Allergies:   No Known Allergies  Social History:   Social History   Socioeconomic History  . Marital status: Married    Spouse name: Not on file  . Number of children: 5  . Years of education: Not on file  . Highest education level: Not on file  Occupational History  . Occupation: Counsellor  Social Needs  . Financial resource strain: Not on file  . Food insecurity:    Worry: Not on file    Inability: Not on file  . Transportation needs:    Medical: Not on file    Non-medical: Not on file  Tobacco Use  . Smoking status: Former Smoker    Packs/Martel: 0.25    Years: 20.00    Pack years: 5.00     Types: Cigarettes    Last attempt to quit: 10/07/2013    Years since quitting: 5.1  . Smokeless tobacco: Never Used  . Tobacco comment: quit cigarettes, might have a cigar 1 X month  Substance and Sexual Activity  . Alcohol use: Yes    Alcohol/week: 3.0 standard drinks    Types: 3 Standard drinks or equivalent per week  . Drug use: No  . Sexual activity: Not Currently    Birth control/protection: None  Lifestyle  . Physical activity:    Days per week: Not on file    Minutes per session: Not on file  . Stress: Not on file  Relationships  . Social connections:    Talks on phone: Not on file    Gets together: Not on file    Attends religious service: Not on file    Active member of club or organization: Not on file    Attends meetings of clubs or organizations: Not on file    Relationship status: Not on file  . Intimate partner violence:    Fear of current or ex partner: Not on file    Emotionally abused: Not on file    Physically abused: Not on file    Forced sexual activity: Not on file  Other Topics Concern  . 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 Anuerysm Brother   . Hyperlipidemia Sister   . Diabetes Sister   . Hypertension Sister   . Diabetes Maternal Grandmother   . Hyperlipidemia Sister   . Kidney disease Sister   . Heart attack Neg Hx      ROS:  Please see the history of present illness.   All other ROS reviewed and negative.     Physical Exam/Data:   Vitals:   12/13/18 0400 12/13/18 0500 12/13/18 0600 12/13/18 0700  BP: 107/74 111/71 111/77 97/66  Pulse: 67  61 61 62  Resp:  _0 Temp:      TempSrc:      SpO2: 97% 99% 99% 100%  Weight:      Height:        Intake/Output Summary (Last 24 hours) at 12/13/2018 0742 Last data  filed at 12/12/2018 2035 Gross per 24 hour  Intake 999 ml  Output -  Net 999 ml   Last 3 Weights 12/12/2018 12/12/2018 10/21/2018  Weight (lbs) 179 lb 14.3 oz 180 lb 177 lb 6.4 oz  Weight (kg) 81.6 kg 81.647 kg 80.468 kg     Body mass index is 28.18 kg/m.  General:  Well nourished, well developed, in no acute distress HEENT: normal Neck: no JVD Vascular: No carotid bruits  Cardiac:  normal S1, S2; RRR; no murmur Lungs:  clear to auscultation bilaterally, no wheezing, rhonchi or rales  Abd: soft, nontender, no hepatomegaly  Ext: R edema s/p SVG harvest Musculoskeletal:  No deformities, BUE and BLE strength normal and equal Skin: warm and dry  Neuro:  CNs 2-12 intact, no focal abnormalities noted Psych:  Normal affect   EKG:  The EKG was personally reviewed and demonstrates:  Sinus rhythm Telemetry:  Telemetry was personally reviewed and demonstrates:  sinus  Relevant CV Studies:  Left heart cath 10/27/17:  Origin to Prox Graft lesion before 1st Mrg is 50% stenosed.    Severe native vessel coronary artery disease with total occlusion of the mid right coronary, total occlusion of the proximal to mid LAD, segmental calcified 90% stenosis in the first diagonal, segmental 80% stenosis in the first obtuse marginal and diffuse 80% stenosis in the second obtuse marginal.  Patent LIMA to LAD.  Occluded saphenous vein graft to the first diagonal.  Occluded distal limb of the sequential saphenous vein graft to OM1 and OM 2.  OM 1 continues to receive adequate blood flow via the graft.  The proximal to ostial graft contains 50% segmental narrowing  Widely patent SVG to the mid PDA.  Severe diffuse disease proximal to the graft insertion site and in the distal RCA and RCA continuation beyond the PDA bifurcation.  Normal left ventricular function.  EF 60%.  EDP 18 mmHg.  RECOMMENDATIONS:   Continue aggressive risk factor modification: LDL less than 70, hemoglobin A1c less than 7,  blood pressure 130/85 mmHg or less, aerobic exercise, beta-blocker therapy, and ACE/ARB therapy.  Nitroglycerin for any prolonged episodes of chest discomfort.    Echo 07/20/17: Study Conclusions - Left ventricle: The cavity size was normal. There was severe   concentric hypertrophy. Systolic function was moderately to   severely reduced. The estimated ejection fraction was in the   range of 30% to 35%. Diffuse hypokinesis. Features are consistent   with a pseudonormal left ventricular filling pattern, with   concomitant abnormal relaxation and increased filling pressure   (grade 2 diastolic dysfunction). - Aortic valve: Trileaflet; mildly thickened, mildly calcified   leaflets. - Left atrium: The atrium was mildly dilated. - Right ventricle: The cavity size was moderately dilated. Wall   thickness was normal. Systolic function was moderately reduced. - Tricuspid valve: There was mild regurgitation. - Pulmonic valve: There was trivial regurgitation. - Pulmonary arteries: Systolic pressure was within the normal   range. - Inferior vena cava: The vessel was normal in size. - Pericardium, extracardiac: There was no pericardial effusion.  Impressions: - Since the last study on 01/21/2016 LVEF has decaresed from 50-55%   to 30-35%. RVEF  is moderately decreased.   Laboratory Data:  Chemistry Recent Labs  Lab 12/12/18 1908  NA 138  K 4.1  CL 105  CO2 21*  GLUCOSE 175*  BUN 22  CREATININE 1.17  CALCIUM 8.9  GFRNONAA >60  GFRAA >60  ANIONGAP 12    Recent Labs  Lab 12/12/18 1908  PROT 7.9  ALBUMIN 4.2  AST 44*  ALT 52*  ALKPHOS 31*  BILITOT 0.5   Hematology Recent Labs  Lab 12/12/18 1908  WBC 6.5  RBC 4.40  HGB 13.5  HCT 41.6  MCV 94.5  MCH 30.7  MCHC 32.5  RDW 13.0  PLT 125*   Cardiac Enzymes Recent Labs  Lab 12/12/18 1908 12/12/18 2216 12/13/18 0230  TROPONINI <0.03 <0.03 <0.03    Recent Labs  Lab 12/12/18 1914 12/12/18 2221  TROPIPOC  0.01 0.00    BNPNo results for input(s): BNP, PROBNP in the last 168 hours.  DDimer No results for input(s): DDIMER in the last 168 hours.  Radiology/Studies:  Dg Chest Portable 1 View  Result Date: 12/12/2018 CLINICAL DATA:  Chest pain and hypertension EXAM: PORTABLE CHEST 1 VIEW COMPARISON:  Feb 13, 2016 FINDINGS: There is no edema or consolidation. Heart is borderline enlarged with pulmonary vascularity normal. No adenopathy. Patient is status post coronary artery bypass grafting. There is aortic atherosclerosis. No pneumothorax. No bone lesions. IMPRESSION: No edema or consolidation. Heart borderline enlarged. Status post coronary artery bypass grafting. Aortic Atherosclerosis (ICD10-I70.0). Electronically Signed   By: Lowella Grip III M.D.   On: 12/12/2018 19:41    Assessment and Plan:   1. Chest pain, coronary artery disease, s/p CABG x 5 with occluded SVG to D1, occluded distal limb of sequential SVG to OM1 and OM2 - troponin x 3 negative - EKG with no ischemic changes - patient describes typical and atypical features of chest pain - he has known disease, but negative enzymes - will discuss with attending utility of repeat angiography vs stress test - may have difficulty titrating anti-anginals given marginal blood pressure - consider decreasing ACEI and adding imdur - obtain echocardiogram   2. Hypertension - BB and ACEI - pressures are marginal   3. Hyperlipidemia - 04/08/2018: Cholesterol 154; HDL 52.70; LDL Cholesterol 72; Triglycerides 147.0; VLDL 29.4 - continue statin and zetia - need repeat lipids since starting zetia   4. Chronic systolic and diastolic heart failure - question nonischemic etiology - EF at the time of CABG was normal - EF now 30-35% with grade 2 DD - home medications include coreg and ACEI - repeat echo pending - he does not appear volume overloaded on exam - he denies increased edema and orthopnea      For questions or updates, please  contact [facility] Please consult [email or web address] for contact info under     Signed, Ledora Bottcher, PA  12/13/2018 7:42 AM

## 2018-12-13 NOTE — ED Notes (Signed)
Pt placed on hospital bed

## 2018-12-14 ENCOUNTER — Encounter (HOSPITAL_COMMUNITY): Payer: Self-pay | Admitting: Interventional Cardiology

## 2018-12-14 ENCOUNTER — Encounter (HOSPITAL_COMMUNITY)
Admission: EM | Disposition: A | Discharge status: Home / Self Care | Payer: Self-pay | Source: Home / Self Care | Attending: Internal Medicine

## 2018-12-14 DIAGNOSIS — I25119 Atherosclerotic heart disease of native coronary artery with unspecified angina pectoris: Secondary | ICD-10-CM | POA: Diagnosis not present

## 2018-12-14 DIAGNOSIS — E7800 Pure hypercholesterolemia, unspecified: Secondary | ICD-10-CM

## 2018-12-14 DIAGNOSIS — I209 Angina pectoris, unspecified: Secondary | ICD-10-CM | POA: Diagnosis not present

## 2018-12-14 DIAGNOSIS — I10 Essential (primary) hypertension: Secondary | ICD-10-CM

## 2018-12-14 DIAGNOSIS — I25709 Atherosclerosis of coronary artery bypass graft(s), unspecified, with unspecified angina pectoris: Secondary | ICD-10-CM

## 2018-12-14 HISTORY — PX: LEFT HEART CATH AND CORS/GRAFTS ANGIOGRAPHY: CATH118250

## 2018-12-14 HISTORY — PX: CORONARY PRESSURE/FFR STUDY: CATH118243

## 2018-12-14 LAB — BASIC METABOLIC PANEL
Anion gap: 6 (ref 5–15)
BUN: 18 mg/dL (ref 8–23)
CO2: 22 mmol/L (ref 22–32)
Calcium: 8.8 mg/dL — ABNORMAL LOW (ref 8.9–10.3)
Chloride: 110 mmol/L (ref 98–111)
Creatinine, Ser: 0.91 mg/dL (ref 0.61–1.24)
GFR calc Af Amer: >60 mL/min (ref >60)
GFR calc non Af Amer: >60 mL/min (ref >60)
Glucose, Bld: 156 mg/dL — ABNORMAL HIGH (ref 70–99)
Potassium: 3.8 mmol/L (ref 3.5–5.1)
Sodium: 138 mmol/L (ref 135–145)

## 2018-12-14 LAB — GLUCOSE, CAPILLARY
Glucose-Capillary: 134 mg/dL — ABNORMAL HIGH (ref 70–99)
Glucose-Capillary: 115 mg/dL — ABNORMAL HIGH (ref 70–99)
Glucose-Capillary: 200 mg/dL — ABNORMAL HIGH (ref 70–99)

## 2018-12-14 LAB — POCT ACTIVATED CLOTTING TIME: Activated Clotting Time: 318 seconds

## 2018-12-14 SURGERY — LEFT HEART CATH AND CORS/GRAFTS ANGIOGRAPHY
Anesthesia: LOCAL

## 2018-12-14 MED ORDER — ONDANSETRON HCL 4 MG/2ML IJ SOLN: 4.0000 mg | Freq: Four times a day (QID) | INTRAMUSCULAR | Status: DC | PRN

## 2018-12-14 MED ORDER — SODIUM CHLORIDE 0.9% FLUSH: 3.0000 mL | INTRAVENOUS | Status: DC | PRN

## 2018-12-14 MED ORDER — ACETAMINOPHEN 325 MG PO TABS: 650.0000 mg | ORAL_TABLET | ORAL | Status: DC | PRN

## 2018-12-14 MED ORDER — RANOLAZINE ER 500 MG PO TB12: 500.0000 mg | ORAL_TABLET | Freq: Two times a day (BID) | ORAL | Status: DC

## 2018-12-14 MED ORDER — IOHEXOL 350 MG/ML SOLN
INTRAVENOUS | Status: DC | PRN
  Administered 2018-12-14: 135 mL via INTRA_ARTERIAL

## 2018-12-14 MED ORDER — FENTANYL CITRATE (PF) 100 MCG/2ML IJ SOLN
INTRAMUSCULAR | Status: AC
  Filled 2018-12-14: qty 2

## 2018-12-14 MED ORDER — MIDAZOLAM HCL 2 MG/2ML IJ SOLN
INTRAMUSCULAR | Status: DC | PRN
  Administered 2018-12-14: 2 mg via INTRAVENOUS

## 2018-12-14 MED ORDER — SODIUM CHLORIDE 0.9 % IV SOLN: INTRAVENOUS | Status: AC

## 2018-12-14 MED ORDER — FENTANYL CITRATE (PF) 100 MCG/2ML IJ SOLN
INTRAMUSCULAR | Status: DC | PRN
  Administered 2018-12-14: 25 ug via INTRAVENOUS

## 2018-12-14 MED ORDER — ADENOSINE (DIAGNOSTIC) 140MCG/KG/MIN
INTRAVENOUS | Status: DC | PRN
  Administered 2018-12-14: 140 ug/kg/min via INTRAVENOUS

## 2018-12-14 MED ORDER — ADENOSINE 12 MG/4ML IV SOLN
INTRAVENOUS | Status: AC
  Filled 2018-12-14: qty 16

## 2018-12-14 MED ORDER — HEPARIN SODIUM (PORCINE) 1000 UNIT/ML IJ SOLN
INTRAMUSCULAR | Status: AC
  Filled 2018-12-14: qty 1

## 2018-12-14 MED ORDER — HEPARIN (PORCINE) IN NACL 1000-0.9 UT/500ML-% IV SOLN
INTRAVENOUS | Status: AC
  Filled 2018-12-14: qty 1000

## 2018-12-14 MED ORDER — MIDAZOLAM HCL 2 MG/2ML IJ SOLN
INTRAMUSCULAR | Status: AC
  Filled 2018-12-14: qty 2

## 2018-12-14 MED ORDER — LIDOCAINE HCL (PF) 1 % IJ SOLN
INTRAMUSCULAR | Status: DC | PRN
  Administered 2018-12-14: 10 mL via INTRADERMAL

## 2018-12-14 MED ORDER — SODIUM CHLORIDE 0.9% FLUSH: 3.0000 mL | Freq: Two times a day (BID) | INTRAVENOUS | Status: DC

## 2018-12-14 MED ORDER — SODIUM CHLORIDE 0.9 % IV SOLN: 250.0000 mL | INTRAVENOUS | Status: DC | PRN

## 2018-12-14 MED ORDER — HEPARIN (PORCINE) IN NACL 1000-0.9 UT/500ML-% IV SOLN
INTRAVENOUS | Status: DC | PRN
  Administered 2018-12-14 (×2): 500 mL

## 2018-12-14 MED ORDER — RANOLAZINE ER 500 MG PO TB12: 500.0000 mg | ORAL_TABLET | Freq: Two times a day (BID) | ORAL | 0 refills | Status: DC

## 2018-12-14 MED ORDER — LIDOCAINE HCL (PF) 1 % IJ SOLN
INTRAMUSCULAR | Status: AC
  Filled 2018-12-14: qty 30

## 2018-12-14 MED ORDER — HEPARIN SODIUM (PORCINE) 1000 UNIT/ML IJ SOLN
INTRAMUSCULAR | Status: DC | PRN
  Administered 2018-12-14: 9000 [IU] via INTRAVENOUS

## 2018-12-14 SURGICAL SUPPLY — 17 items
CATH INFINITI 5 FR IM (CATHETERS) ×2 IMPLANT
CATH INFINITI 5FR AL1 (CATHETERS) ×2 IMPLANT
CATH INFINITI 5FR MULTPACK ANG (CATHETERS) ×2 IMPLANT
CATH LAUNCHER 6FR EBU 3.75 (CATHETERS) ×2 IMPLANT
CLOSURE MYNX CONTROL 6F/7F (Vascular Products) ×2 IMPLANT
GUIDEWIRE PRESSURE COMET II (WIRE) ×2 IMPLANT
KIT HEART LEFT (KITS) ×2 IMPLANT
KIT HEMO VALVE WATCHDOG (MISCELLANEOUS) ×2 IMPLANT
PACK CARDIAC CATHETERIZATION (CUSTOM PROCEDURE TRAY) ×2 IMPLANT
SHEATH PINNACLE 5F 10CM (SHEATH) ×2 IMPLANT
SHEATH PINNACLE 6F 10CM (SHEATH) ×2 IMPLANT
SHEATH PROBE COVER 6X72 (BAG) ×2 IMPLANT
TRANSDUCER W/STOPCOCK (MISCELLANEOUS) ×2 IMPLANT
TUBING CIL FLEX 10 FLL-RA (TUBING) ×2 IMPLANT
WIRE EMERALD 3MM-J .035X150CM (WIRE) ×2 IMPLANT
WIRE EMERALD 3MM-J .035X260CM (WIRE) ×2 IMPLANT
WIRE HI TORQ VERSACORE-J 145CM (WIRE) ×2 IMPLANT

## 2018-12-14 NOTE — Interval H&P Note (Signed)
Cath Lab Visit (complete for each Cath Lab visit)  Clinical Evaluation Leading to the Procedure:   ACS: Yes.    Non-ACS:    Anginal Classification: CCS IV  Anti-ischemic medical therapy: Minimal Therapy (1 class of medications)  Non-Invasive Test Results: No non-invasive testing performed  Prior CABG: Previous CABG      History and Physical Interval Note:  12/14/2018 10:24 AM  Jerome Vaughn  has presented today for surgery, with the diagnosis of unstable angina.  The various methods of treatment have been discussed with the patient and family. After consideration of risks, benefits and other options for treatment, the patient has consented to  Procedure(s): LEFT HEART CATH AND CORS/GRAFTS ANGIOGRAPHY (N/A) as a surgical intervention.  The patient's history has been reviewed, patient examined, no change in status, stable for surgery.  I have reviewed the patient's chart and labs.  Questions were answered to the patient's satisfaction.     Larae Grooms

## 2018-12-14 NOTE — Progress Notes (Signed)
Progress Note  Patient Name: Jerome Vaughn Date of Encounter: 12/14/2018  Primary Cardiologist: Jerome Grooms, MD   Subjective   Feeling well.  No recurrent chest pain today.   Inpatient Medications    Scheduled Meds: . aspirin EC  81 mg Oral Daily  . carvedilol  3.125 mg Oral BID WC  . ezetimibe  10 mg Oral Daily  . heparin  5,000 Units Subcutaneous Q8H  . insulin aspart  0-5 Units Subcutaneous QHS  . insulin aspart  0-9 Units Subcutaneous TID WC  . ranolazine  500 mg Oral BID  . rosuvastatin  40 mg Oral q1800  . sodium chloride flush  3 mL Intravenous Q12H   Continuous Infusions: . sodium chloride 20 mL/hr (12/12/18 1918)  . sodium chloride     PRN Meds: sodium chloride, acetaminophen, acetaminophen, morphine injection, ondansetron (ZOFRAN) IV, ondansetron (ZOFRAN) IV, sodium chloride flush   Vital Signs    Vitals:   12/14/18 1425 12/14/18 1452 12/14/18 1525 12/14/18 1625  BP: (!) 146/102 137/88 135/89 (!) 146/95  Pulse: 60 (!) 54 67 63  Resp: 17 18 (!) 22 12  Temp:  97.8 F (36.6 C)    TempSrc:  Oral    SpO2: 99% 99% 100% 96%  Weight:      Height:        Intake/Output Summary (Last 24 hours) at 12/14/2018 1651 Last data filed at 12/14/2018 0400 Gross per 24 hour  Intake 494 ml  Output -  Net 494 ml   Last 3 Weights 12/14/2018 12/13/2018 12/12/2018  Weight (lbs) 178 lb 8 oz 182 lb 179 lb 14.3 oz  Weight (kg) 80.967 kg 82.555 kg 81.6 kg      Telemetry    Sinus rhythm.  Occasional PVCs - Personally Reviewed  ECG    Sinus rhythm.  Rate 66 bpm.  LAFB.  - Personally Reviewed  Physical Exam   VS:  BP (!) 146/95   Pulse 63   Temp 97.8 F (36.6 C) (Oral)   Resp 12   Ht 5\' 7"  (1.702 m)   Wt 81 kg   SpO2 96%   BMI 27.96 kg/m  , BMI Body mass index is 27.96 kg/m. GENERAL:  Well appearing HEENT: Pupils equal round and reactive, fundi not visualized, oral mucosa unremarkable NECK:  No jugular venous distention, waveform within normal limits,  carotid upstroke brisk and symmetric, no bruits LUNGS:  Clear to auscultation bilaterally HEART:  RRR.  PMI not displaced or sustained,S1 and S2 within normal limits, no S3, no S4, no clicks, no rubs, no murmurs ABD:  Flat, positive bowel sounds normal in frequency in pitch, no bruits, no rebound, no guarding, no midline pulsatile mass, no hepatomegaly, no splenomegaly EXT:  2 plus pulses throughout, no edema, no cyanosis no clubbing.  R groin cath site C/D/I.  No bruit or hematoma. SKIN:  No rashes no nodules NEURO:  Cranial nerves II through XII grossly intact, motor grossly intact throughout PSYCH:  Cognitively intact, oriented to person place and time   Labs    Chemistry Recent Labs  Lab 12/12/18 1908 12/14/18 0330  NA 138 138  K 4.1 3.8  CL 105 110  CO2 21* 22  GLUCOSE 175* 156*  BUN 22 18  CREATININE 1.17 0.91  CALCIUM 8.9 8.8*  PROT 7.9  --   ALBUMIN 4.2  --   AST 44*  --   ALT 52*  --   ALKPHOS 31*  --  BILITOT 0.5  --   GFRNONAA >60 >60  GFRAA >60 >60  ANIONGAP 12 6     Hematology Recent Labs  Lab 12/12/18 1908  WBC 6.5  RBC 4.40  HGB 13.5  HCT 41.6  MCV 94.5  MCH 30.7  MCHC 32.5  RDW 13.0  PLT 125*    Cardiac Enzymes Recent Labs  Lab 12/12/18 1908 12/12/18 2216 12/13/18 0230  TROPONINI <0.03 <0.03 <0.03    Recent Labs  Lab 12/12/18 1914 12/12/18 2221  TROPIPOC 0.01 0.00     BNPNo results for input(s): BNP, PROBNP in the last 168 hours.   DDimer No results for input(s): DDIMER in the last 168 hours.   Radiology    Dg Chest Portable 1 View  Result Date: 12/12/2018 CLINICAL DATA:  Chest pain and hypertension EXAM: PORTABLE CHEST 1 VIEW COMPARISON:  Feb 13, 2016 FINDINGS: There is no edema or consolidation. Heart is borderline enlarged with pulmonary vascularity normal. No adenopathy. Patient is status post coronary artery bypass grafting. There is aortic atherosclerosis. No pneumothorax. No bone lesions. IMPRESSION: No edema or  consolidation. Heart borderline enlarged. Status post coronary artery bypass grafting. Aortic Atherosclerosis (ICD10-I70.0). Electronically Signed   By: Lowella Grip III M.D.   On: 12/12/2018 19:41    Cardiac Studies   LHC 12/14/18:  Ost LM to Dist LM lesion is 65% stenosed. FFR of this lesion into the distal circumflex was negative, 0.95.  Prox LAD lesion is 85% stenosed. LIMA to LAD is patent.  1st Diag lesion is 100% stenosed. SVG to diagonal is occluded.  Ost 2nd Mrg to 2nd Mrg lesion is 65% stenosed. Jump graft from OM1 to OM2 is occluded.  Ost 1st Mrg lesion is 75% stenosed. SVG to OM1 portion is patent.  Mid RCA lesion is 100% stenosed. SVG to PDA is patent. Ostial PDA disease is unchanged from prior.  Ost RPDA to RPDA lesion is 95% stenosed.  The left ventricular ejection fraction is 35-45% by visual estimate.  There is mild to moderate left ventricular systolic dysfunction.  LV end diastolic pressure is normal.   Continue medical therapy for small vessel disease and for LV dysfunction.   Echo 12/13/18: IMPRESSIONS    1. The left ventricle has mildly reduced systolic function, with an ejection fraction of 45-50%. The cavity size was normal. Left ventricular diastolic Doppler parameters are indeterminate. There is abnormal septal motion consistent with post-operative  status. Left ventricular diffuse hypokinesis.  2. The right ventricle has normal systolic function. The cavity was normal. There is no increase in right ventricular wall thickness.  3. Left atrial size was mildly dilated.  4. There is mild mitral annular calcification present.  5. The aortic valve is tricuspid Mild thickening of the aortic valve Mild calcification of the aortic valve.   Patient Profile     [name] is a 62M with CAD s/p CABG (LIMA to LAD and SVG to PDA patent 09/2017.  SVG to D1 and sequential SVG to OM1/OM2 occluded  after OM1. RCA diffusely diseased before and after the SVG  insertion site.  Here with chest pain.  Assessment & Plan    # CAD s/p CABG: # Angina: [name] presented with angina.  Cath unchanged from prior.  He uses sildenafil so we cannot add Imdur.  We will try adding ranolazine 500mg  bid.  We will arrange outpatient follow up.  Continue aspirin, rosuvastatin, and Zetia.  # Hypertension: BP controlled.  Continue carvedilol and resume home benazepril.  #  Chronic systolic and diastolic heart failure: LVEF 45-50%.  He is euvolemic on exam.  Continue carvedilol.    # DM:  Continue Jardiance.     [facility] will sign off.   Medication Recommendations:  Adding ranolazine Other recommendations (labs, testing, etc):  n/a Follow up as an outpatient:  We will arrange  For questions or updates, please contact [place] Please consult [email or web address] for contact info under        Signed, Skeet Latch, MD  12/14/2018, 4:51 PM

## 2018-12-14 NOTE — Discharge Summary (Signed)
Physician Discharge Summary  Jerome Vaughn MLY:[id number] DOB: Jan 23, 1951 DOA: 12/12/2018  PCP: Jerome Morale, MD  Admit date: 12/12/2018 Discharge date: 12/14/2018  Admitted From: Home. Disposition: Home.  Recommendations for Outpatient Follow-up:  1. Follow up with PCP in 1-2 weeks  Home Health: None Equipment/Devices: None  Discharge Condition: Stable CODE STATUS: Full code Diet recommendation: Heart healthy and low carbohydrate.  Hospitalization Summary: Jerome Vaughn is a 70M with CAD s/p CABG (LIMA to LAD and SVG to PDA patent 09/2017. SVG to D1 and sequential SVG to OM1/OM2 occluded after OM1. RCA diffusely diseased before and after the SVG insertion site.   He was admitted to the hospital with another episode of chest pain.  Had negative EKG and troponins.  Underwent cardiac cath that showed diffuse disease as above.  Cardiology recommended medical management, ranolazine 500 mg twice daily added to his regimen.  He already takes aspirin, carvedilol, benazepril.  After cardiac cath and no intervention, he was discharged home on a stable condition.  He will follow-up with cardiology outpatient.  Nitrates could not be added because he takes sildenafil regularly.  Discharge Diagnoses:  Principal Problem:   Angina pectoris ([place]) Active Problems:   Hypertension   Hyperlipidemia   Coronary artery disease involving coronary bypass graft of native heart with angina pectoris (HCC)   S/P CABG x 5    Discharge Instructions  Discharge Instructions    Call MD for:   Complete by:  As directed    Persistent and recurring chest pain   Diet - low sodium heart healthy   Complete by:  As directed    Discharge instructions   Complete by:  As directed    Start taking your metformin 12/16/2018   Increase activity slowly   Complete by:  As directed      Allergies as of 12/14/2018   No Known Allergies     Medication List    STOP taking these medications   naproxen 500 MG tablet Commonly  known as:  NAPROSYN   tadalafil 20 MG tablet Commonly known as:  Cialis   tiZANidine 4 MG capsule Commonly known as:  Zanaflex     TAKE these medications   acetaminophen 500 MG tablet Commonly known as:  TYLENOL Take 1 tablet (500 mg total) by mouth every 6 (six) hours as needed. What changed:  reasons to take this   aspirin EC 81 MG tablet Take 1 tablet (81 mg total) by mouth daily.   benazepril 20 MG tablet Commonly known as:  LOTENSIN TAKE 1 TABLET BY MOUTH ONCE DAILY   bromocriptine 2.5 MG tablet Commonly known as:  PARLODEL TAKE 1 TABLET BY MOUTH ONCE DAILY   carvedilol 3.125 MG tablet Commonly known as:  COREG Take 1 tablet (3.125 mg total) by mouth 2 (two) times daily with a meal.   empagliflozin 25 MG Tabs tablet Commonly known as:  JARDIANCE TAKE 1 TABLET BY MOUTH ONCE DAILY   ezetimibe 10 MG tablet Commonly known as:  ZETIA Take 1 tablet by mouth once daily   ferrous sulfate 325 (65 FE) MG tablet Take 325 mg by mouth daily with breakfast.   Fish Oil 1000 MG Caps Take 1,000 mg by mouth daily.   glucose blood test strip Commonly known as:  OneTouch Verio Use as instructed to check BG once a [place]   metFORMIN 500 MG 24 hr tablet Commonly known as:  GLUCOPHAGE-XR TAKE 4 TABLETS BY MOUTH ONCE DAILY What changed:  See the  new instructions.   multivitamin tablet Take 1 tablet by mouth daily.   nitroGLYCERIN 0.4 MG SL tablet Commonly known as:  NITROSTAT Place 1 tablet (0.4 mg total) under the tongue every 5 (five) minutes as needed for chest pain.   OneTouch Verio Flex System w/Device Kit USE AS DIRECTED   ranolazine 500 MG 12 hr tablet Commonly known as:  RANEXA Take 1 tablet (500 mg total) by mouth 2 (two) times daily for 30 days.   rosuvastatin 40 MG tablet Commonly known as:  CRESTOR TAKE ONE TABLET BY MOUTH DAILY AS DIRECTED FOR CHOLESTEROL What changed:    how much to take  how to take this  when to take this  additional  instructions   sildenafil 100 MG tablet Commonly known as:  Viagra Take 1 tablet (100 mg total) by mouth daily as needed for erectile dysfunction.   VITAMIN B-12 PO Take 1 tablet by mouth daily.      Follow-up Information    Jerome Morale, MD Follow up in 1 week(s).   Specialty:  Family Medicine Contact information: [place] [address] [phone]        Jerome Crome, MD .   Specialty:  Cardiology Contact information: [phone] [address] [phone]          No Known Allergies  Consultations:  Cardiology.   Procedures/Studies: Dg Chest Portable 1 View  Result Date: 12/12/2018 CLINICAL DATA:  Chest pain and hypertension EXAM: PORTABLE CHEST 1 VIEW COMPARISON:  Feb 13, 2016 FINDINGS: There is no edema or consolidation. Heart is borderline enlarged with pulmonary vascularity normal. No adenopathy. Patient is status post coronary artery bypass grafting. There is aortic atherosclerosis. No pneumothorax. No bone lesions. IMPRESSION: No edema or consolidation. Heart borderline enlarged. Status post coronary artery bypass grafting. Aortic Atherosclerosis (ICD10-I70.0). Electronically Signed   By: Lowella Grip III M.D.   On: 12/12/2018 19:41    Echocardiogram with a EF of 45 to 50%.  Unchanged from before.   Subjective: Patient was seen and examined on the [place] of discharge.  Patient was sent to [facility] for cardiac cath, he was monitored postprocedure and discharged from there.  He was asymptomatic with no recurrence of chest pain today.   Discharge Exam: Vitals:   12/14/18 1525 12/14/18 1625  BP: 135/89 (!) 146/95  Pulse: 67 63  Resp: (!) 22 12  Temp:    SpO2: 100% 96%   Vitals:   12/14/18 1425 12/14/18 1452 12/14/18 1525 12/14/18 1625  BP: (!) 146/102 137/88 135/89 (!) 146/95  Pulse: 60 (!) 54 67 63  Resp: 17 18 (!) 22 12  Temp:  97.8 F (36.6 C)    TempSrc:  Oral    SpO2: 99%  99% 100% 96%  Weight:      Height:        General: Pt is alert, awake, not in acute distress Cardiovascular: RRR, S1/S2 +, no rubs, no gallops Respiratory: CTA bilaterally, no wheezing, no rhonchi Abdominal: Soft, NT, ND, bowel sounds + Extremities: no edema, no cyanosis    The results of significant diagnostics from this hospitalization (including imaging, microbiology, ancillary and laboratory) are listed below for reference.     Microbiology: No results found for this or any previous visit (from the past 240 hour(s)).   Labs: BNP (last 3 results) No results for input(s): BNP in the last 8760 hours. Basic Metabolic Panel: Recent Labs  Lab  12/12/18 1908 12/14/18 0330  NA 138 138  K 4.1 3.8  CL 105 110  CO2 21* 22  GLUCOSE 175* 156*  BUN 22 18  CREATININE 1.17 0.91  CALCIUM 8.9 8.8*   Liver Function Tests: Recent Labs  Lab 12/12/18 1908  AST 44*  ALT 52*  ALKPHOS 31*  BILITOT 0.5  PROT 7.9  ALBUMIN 4.2   Recent Labs  Lab 12/12/18 1907  LIPASE 40   No results for input(s): AMMONIA in the last 168 hours. CBC: Recent Labs  Lab 12/12/18 1908  WBC 6.5  NEUTROABS 4.4  HGB 13.5  HCT 41.6  MCV 94.5  PLT 125*   Cardiac Enzymes: Recent Labs  Lab 12/12/18 1908 12/12/18 2216 12/13/18 0230  TROPONINI <0.03 <0.03 <0.03   BNP: Invalid input(s): POCBNP CBG: Recent Labs  Lab 12/13/18 1214 12/13/18 1735 12/13/18 2037 12/14/18 0747 12/14/18 1312  GLUCAP 211* 110* 135* 134* 115*   D-Dimer No results for input(s): DDIMER in the last 72 hours. Hgb A1c No results for input(s): HGBA1C in the last 72 hours. Lipid Profile No results for input(s): CHOL, HDL, LDLCALC, TRIG, CHOLHDL, LDLDIRECT in the last 72 hours. Thyroid function studies No results for input(s): TSH, T4TOTAL, T3FREE, THYROIDAB in the last 72 hours.  Invalid input(s): FREET3 Anemia work up No results for input(s): VITAMINB12, FOLATE, FERRITIN, TIBC, IRON, RETICCTPCT in the last 72  hours. Urinalysis    Component Value Date/Time   COLORURINE YELLOW 05/19/2013 2205   APPEARANCEUR CLEAR 05/19/2013 2205   LABSPEC 1.034 (H) 05/19/2013 2205   PHURINE 6.5 05/19/2013 2205   GLUCOSEU >1000 (A) 05/19/2013 2205   HGBUR NEGATIVE 05/19/2013 2205   BILIRUBINUR n 04/08/2018 0913   KETONESUR NEGATIVE 05/19/2013 2205   PROTEINUR Negative 04/08/2018 0913   PROTEINUR NEGATIVE 05/19/2013 2205   UROBILINOGEN 0.2 04/08/2018 0913   UROBILINOGEN 1.0 05/19/2013 2205   NITRITE n 04/08/2018 0913   NITRITE NEGATIVE 05/19/2013 2205   LEUKOCYTESUR Negative 04/08/2018 0913   Sepsis Labs Invalid input(s): PROCALCITONIN,  WBC,  LACTICIDVEN Microbiology No results found for this or any previous visit (from the past 240 hour(s)).   Time coordinating discharge: 25 minutes  SIGNED:   Barb Merino, MD  [facility] 12/14/2018, 5:00 PM Pager   If 7PM-7AM, please contact night-coverage [email or web address] Password [email or web address]

## 2018-12-14 NOTE — Progress Notes (Signed)
PROGRESS NOTE    Jerome Vaughn  ZTI:[id number] DOB: Dec 31, 1950 DOA: 12/12/2018 PCP: [name], MD    Brief Narrative:   Patient is 68 year old gentleman with history of extensive coronary artery disease, triple-vessel coronary artery disease status post CABG, type 2 diabetes, hypertension, hyperlipidemia, chronic combined heart failure, recent cardiac cath presented to the emergency room with recurrent persistent chest pain.  He needed multiple doses of nitroglycerin for relief of pain.  He has been admitted for evaluation for acute coronary syndrome.  Troponins and EKG remained nonischemic.  Followed by cardiology.  Echocardiogram showed mostly chronic findings. He is going for cardiac cath today.  Assessment & Plan:   Principal Problem:   Angina pectoris ([place]) Active Problems:   Hypertension   Hyperlipidemia   Coronary artery disease involving coronary bypass graft of native heart with angina pectoris (HCC)   S/P CABG x 5  Angina with history of coronary artery disease status post CABG x5: Extensive history of coronary artery disease. Currently chest pain-free.  Troponins and EKG are nonischemic. Currently remains on aspirin,Statin, beta-blockers.  Going for cardiac cath today to [facility].  Hypertension: Blood pressure soft on arrival after multiple doses of nitroglycerin.   Coreg resumed .  Holding Lotensin.  Hyperlipidemia: Patient tolerating Crestor.  Continue.  Type 2 diabetes on oral hypoglycemics: Patient with fairly controlled diabetes.  He is on metformin and Jardiance at home.  Remains on sliding scale insulin in the hospital.   DVT prophylaxis: Heparin subcu Code Status: Full code Family Communication: No family at bedside Disposition Plan: Home when is stable.   For cardiac cath to [facility] today.   Consultants:   Cardiology.  Procedures:   Echocardiogram.  Cardiac cath 12/14/2018,  Antimicrobials:   None.   Subjective: Patient was seen and  examined.  He was being transferred to [facility].  Had occasional very mild chest pain since last 24 hours.  He had short lasting mild pain last night improved on its own.  He did not want nitroglycerin because it gave him headache.  Currently remains a stable.  Objective: Vitals:   12/13/18 2034 12/14/18 0045 12/14/18 0417 12/14/18 0700  BP: 125/79 118/71 120/83   Pulse: 64 63 (!) 54   Resp: 18 16 14    Temp: 98.2 F (36.8 C) 98.6 F (37 C) 98.1 F (36.7 C)   TempSrc: Oral Oral Oral   SpO2: 100% 97% 100%   Weight:    81 kg  Height:        Intake/Output Summary (Last 24 hours) at 12/14/2018 0920 Last data filed at 12/14/2018 0400 Gross per 24 hour  Intake 494 ml  Output -  Net 494 ml   Filed Weights   12/12/18 1912 12/13/18 1349 12/14/18 0700  Weight: (S) 81.6 kg 82.6 kg 81 kg    Examination:  General exam: Appears calm and comfortable  Respiratory system: Clear to auscultation. Respiratory effort normal. Cardiovascular system: S1 & S2 heard, RRR. No JVD, murmurs, rubs, gallops or clicks. No pedal edema. Gastrointestinal system: Abdomen is nondistended, soft and nontender. No organomegaly or masses felt. Normal bowel sounds heard. Central nervous system: Alert and oriented. No focal neurological deficits. Extremities: Symmetric 5 x 5 power. Skin: No rashes, lesions or ulcers Psychiatry: Judgement and insight appear normal. Mood & affect appropriate.     Data Reviewed: I have personally reviewed following labs and imaging studies  CBC: Recent Labs  Lab 12/12/18 1908  WBC 6.5  NEUTROABS 4.4  HGB 13.5  HCT 41.6  MCV 94.5  PLT 341*   Basic Metabolic Panel: Recent Labs  Lab 12/12/18 1908 12/14/18 0330  NA 138 138  K 4.1 3.8  CL 105 110  CO2 21* 22  GLUCOSE 175* 156*  BUN 22 18  CREATININE 1.17 0.91  CALCIUM 8.9 8.8*   GFR: Estimated Creatinine Clearance: 82.5 mL/min (by C-G formula based on SCr of 0.91 mg/dL). Liver Function Tests: Recent  Labs  Lab 12/12/18 1908  AST 44*  ALT 52*  ALKPHOS 31*  BILITOT 0.5  PROT 7.9  ALBUMIN 4.2   Recent Labs  Lab 12/12/18 1907  LIPASE 40   No results for input(s): AMMONIA in the last 168 hours. Coagulation Profile: Recent Labs  Lab 12/12/18 1908  INR 0.9   Cardiac Enzymes: Recent Labs  Lab 12/12/18 1908 12/12/18 2216 12/13/18 0230  TROPONINI <0.03 <0.03 <0.03   BNP (last 3 results) No results for input(s): PROBNP in the last 8760 hours. HbA1C: No results for input(s): HGBA1C in the last 72 hours. CBG: Recent Labs  Lab 12/13/18 0816 12/13/18 1214 12/13/18 1735 12/13/18 2037 12/14/18 0747  GLUCAP 91 211* 110* 135* 134*   Lipid Profile: No results for input(s): CHOL, HDL, LDLCALC, TRIG, CHOLHDL, LDLDIRECT in the last 72 hours. Thyroid Function Tests: No results for input(s): TSH, T4TOTAL, FREET4, T3FREE, THYROIDAB in the last 72 hours. Anemia Panel: No results for input(s): VITAMINB12, FOLATE, FERRITIN, TIBC, IRON, RETICCTPCT in the last 72 hours. Sepsis Labs: No results for input(s): PROCALCITON, LATICACIDVEN in the last 168 hours.  No results found for this or any previous visit (from the past 240 hour(s)).       Radiology Studies: Dg Chest Portable 1 View  Result Date: 12/12/2018 CLINICAL DATA:  Chest pain and hypertension EXAM: PORTABLE CHEST 1 VIEW COMPARISON:  Feb 13, 2016 FINDINGS: There is no edema or consolidation. Heart is borderline enlarged with pulmonary vascularity normal. No adenopathy. Patient is status post coronary artery bypass grafting. There is aortic atherosclerosis. No pneumothorax. No bone lesions. IMPRESSION: No edema or consolidation. Heart borderline enlarged. Status post coronary artery bypass grafting. Aortic Atherosclerosis (ICD10-I70.0). Electronically Signed   By: Lowella Grip III M.D.   On: 12/12/2018 19:41        Scheduled Meds: . aspirin EC  81 mg Oral Daily  . carvedilol  3.125 mg Oral BID WC  . ezetimibe  10  mg Oral Daily  . heparin  5,000 Units Subcutaneous Q8H  . insulin aspart  0-5 Units Subcutaneous QHS  . insulin aspart  0-9 Units Subcutaneous TID WC  . rosuvastatin  40 mg Oral q1800  . sodium chloride flush  3 mL Intravenous Q12H   Continuous Infusions: . sodium chloride 20 mL/hr (12/12/18 1918)  . sodium chloride    . sodium chloride 10 mL/hr at 12/14/18 0705     LOS: 0 days    Time spent: 25 minutes.    Barb Merino, MD [facility] Pager [phone]  If 7PM-7AM, please contact night-coverage [email or web address] Password [email or web address] 12/14/2018, 9:20 AM

## 2018-12-24 ENCOUNTER — Encounter: Payer: Self-pay | Admitting: Cardiology

## 2018-12-27 ENCOUNTER — Telehealth (INDEPENDENT_AMBULATORY_CARE_PROVIDER_SITE_OTHER): Payer: 59 | Admitting: Cardiology

## 2018-12-27 ENCOUNTER — Encounter: Payer: Self-pay | Admitting: Cardiology

## 2018-12-27 ENCOUNTER — Other Ambulatory Visit: Payer: Self-pay

## 2018-12-27 ENCOUNTER — Telehealth: Payer: Self-pay | Admitting: *Deleted

## 2018-12-27 VITALS — BP 115/73 | HR 62 | Ht 67.5 in | Wt 181.0 lb

## 2018-12-27 DIAGNOSIS — Z951 Presence of aortocoronary bypass graft: Secondary | ICD-10-CM

## 2018-12-27 DIAGNOSIS — I5042 Chronic combined systolic (congestive) and diastolic (congestive) heart failure: Secondary | ICD-10-CM

## 2018-12-27 DIAGNOSIS — E118 Type 2 diabetes mellitus with unspecified complications: Secondary | ICD-10-CM

## 2018-12-27 DIAGNOSIS — I2510 Atherosclerotic heart disease of native coronary artery without angina pectoris: Secondary | ICD-10-CM

## 2018-12-27 DIAGNOSIS — I110 Hypertensive heart disease with heart failure: Secondary | ICD-10-CM | POA: Diagnosis not present

## 2018-12-27 DIAGNOSIS — I10 Essential (primary) hypertension: Secondary | ICD-10-CM

## 2018-12-27 DIAGNOSIS — Z7984 Long term (current) use of oral hypoglycemic drugs: Secondary | ICD-10-CM

## 2018-12-27 DIAGNOSIS — Z87891 Personal history of nicotine dependence: Secondary | ICD-10-CM

## 2018-12-27 DIAGNOSIS — Z79899 Other long term (current) drug therapy: Secondary | ICD-10-CM

## 2018-12-27 NOTE — Progress Notes (Signed)
Virtual Visit via Telephone Note    Evaluation Performed:  Follow-up visit  This visit type was conducted due to national recommendations for restrictions regarding the COVID-19 Pandemic (e.g. social distancing).  This format is felt to be most appropriate for this patient at this time.  All issues noted in this document were discussed and addressed.  No physical exam was performed (except for noted visual exam findings with Video Visits).  Please refer to the patient's chart (MyChart message for video visits and phone note for telephone visits) for the patient's consent to telehealth for [facility].  Date:  12/27/2018   ID:  Jerome Vaughn, DOB [birth year], MRN [id number]  Patient Location:  home  Provider location:   Office  PCP:  [name], MD  Cardiologist:  Sinclair Grooms, MD  Electrophysiologist:  None   Chief Complaint:  Post hospital visit  History of Present Illness:    Jerome Vaughn is a 68 y.o. male who presents via audio conferencing for a telehealth visit today.    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  Pt admitted 12/13/18 with chest pain.   He actually has had chest pain since  CABG and nuc was low risk.  Follow up cath 09/2017 with occlusion of VG to 1st diag   Pt admitted and cardiac cath was done 12/14/18 ostial LM to distal LM lesion 65% stenosed and FR was neg at 0.95. VG to first diag 100% stenosed as before. Jump graft from OM1 to OM2 is occluded;  SVG to OM1 portion is patent;  SVG to PDA is patent. Ostial PDA disease is unchanged from prior. EF 35-45% stenosis.  Medical therapy recommended. No imdur due to pt on sildenafil.  ranexa added.    Echo 12/13/18 EF 45-50%  At least exam prior to discharge pt was euvolemic.     The patient does not symptoms concerning  for COVID-19 infection (fever, chills, cough, or new shortness of breath). He and his wife are staying home except for essentials.  No fevers.  No chest pain except very mild and he thought it was due to spicy foods.  He believes the Ranexa has helped his pain.   He is staying active walking in the house and outside and climbing stairs in the house.  His cath site without soreness or swelling.  Some bruising but not severe.  He is eating healthy grocery shopping may be somewhat challenging.      Prior CV studies:   The following studies were reviewed today:  LHC 12/14/18:  Ost LM to Dist LM lesion is 65% stenosed. FFR of this lesion into the distal circumflex was negative, 0.95.  Prox LAD lesion is 85% stenosed. LIMA to LAD is patent.  1st Diag lesion is 100% stenosed. SVG to diagonal is occluded.  Ost 2nd Mrg to 2nd Mrg lesion is 65% stenosed. Jump graft from OM1 to OM2 is occluded.  Ost 1st Mrg lesion is 75% stenosed. SVG to OM1 portion is patent.  Mid RCA lesion is 100% stenosed. SVG to PDA is patent. Ostial PDA disease is unchanged from prior.  Ost RPDA to RPDA lesion is 95% stenosed.  The left ventricular ejection fraction is 35-45% by visual estimate.  There is mild to moderate left ventricular systolic dysfunction.  LV  end diastolic pressure is normal.  Continue medical therapy for small vessel disease and for LV dysfunction.   Echo 12/13/18: IMPRESSIONS   1. The left ventricle has mildly reduced systolic function, with an ejection fraction of 45-50%. The cavity size was normal. Left ventricular diastolic Doppler parameters are indeterminate. There is abnormal septal motion consistent with post-operative  status. Left ventricular diffuse hypokinesis. 2. The right ventricle has normal systolic function. The cavity was normal. There is no increase in right ventricular wall thickness. 3. Left atrial size was mildly dilated. 4. There is mild mitral annular  calcification present. 5. The aortic valve is tricuspid Mild thickening of the aortic valve Mild calcification of the aortic valve.   Past Medical History:  Diagnosis Date  . Carpal tunnel syndrome    both hands  . Coronary artery disease    a. Cath 11/21/2015: Multivessel CAD --> CABG recommended. b. CABG on 11/22/2015:  LIMA-LAD, SVG-Diag, SVG-OM1 and distal Cx, and SVG-PDA.  . Diabetes mellitus   . History of echocardiogram    a. Echo 4/17: Moderate LVH, EF 50-55%, mild LAE, no pericardial effusion  . Hyperlipidemia   . Hypertension    Past Surgical History:  Procedure Laterality Date  . CARDIAC CATHETERIZATION N/A 11/21/2015   Procedure: Left Heart Cath and Coronary Angiography;  Surgeon: [name], MD;  Location: [place] [facility];  Service: Cardiovascular;  Laterality: N/A;  . colonsocopy  12/07/2009   per Dr. [name], benign polyps, repeat in 10 yrs   . CORONARY ARTERY BYPASS GRAFT N/A 11/22/2015   Procedure: CORONARY ARTERY BYPASS GRAFTING (CABG) times five using the left internal mammary, right greater saphenous vein EVH, and left thigh greater saphenous vein EVH;  Surgeon: [name], MD;  Location: [place];  Service: Open Heart Surgery;  Laterality: N/A;  . frozen shoulder release    . INTRAVASCULAR PRESSURE WIRE/FFR STUDY N/A 12/14/2018   Procedure: INTRAVASCULAR PRESSURE WIRE/FFR STUDY;  Surgeon: Jettie Booze, MD;  Location: [place] [facility];  Service: Cardiovascular;  Laterality: N/A;  . LEFT HEART CATH AND CORS/GRAFTS ANGIOGRAPHY N/A 10/27/2017   Procedure: LEFT HEART CATH AND CORS/GRAFTS ANGIOGRAPHY;  Surgeon: [name], MD;  Location: [place] [facility];  Service: Cardiovascular;  Laterality: N/A;  . LEFT HEART CATH AND CORS/GRAFTS ANGIOGRAPHY N/A 12/14/2018   Procedure: LEFT HEART CATH AND CORS/GRAFTS ANGIOGRAPHY;  Surgeon: Jettie Booze, MD;  Location: [facility];  Service: Cardiovascular;  Laterality: N/A;  . TEE WITHOUT  CARDIOVERSION N/A 11/22/2015   Procedure: TRANSESOPHAGEAL ECHOCARDIOGRAM (TEE);  Surgeon: [name], MD;  Location: [place];  Service: Open Heart Surgery;  Laterality: N/A;  . WRIST GANGLION EXCISION       Current Meds  Medication Sig  . acetaminophen (TYLENOL) 500 MG tablet Take 1 tablet (500 mg total) by mouth every 6 (six) hours as needed. (Patient taking differently: Take 500 mg by mouth every 6 (six) hours as needed for mild pain, moderate pain or headache. )  . aspirin EC 81 MG tablet Take 1 tablet (81 mg total) by mouth daily.  . benazepril (LOTENSIN) 20 MG tablet TAKE 1 TABLET BY MOUTH ONCE DAILY (Patient taking differently: Take 20 mg by mouth daily. )  . Blood Glucose Monitoring Suppl ([place] FLEX SYSTEM) w/Device KIT USE AS DIRECTED  . bromocriptine (PARLODEL) 2.5 MG tablet TAKE 1 TABLET BY MOUTH ONCE DAILY (Patient taking differently: Take 2.5 mg by mouth daily. )  . carvedilol (COREG)  3.125 MG tablet Take 1 tablet (3.125 mg total) by mouth 2 (two) times daily with a meal.  . Cyanocobalamin (VITAMIN B-12 PO) Take 1 tablet by mouth daily.  . empagliflozin (JARDIANCE) 25 MG TABS tablet TAKE 1 TABLET BY MOUTH ONCE DAILY (Patient taking differently: Take 25 mg by mouth daily. )  . ezetimibe (ZETIA) 10 MG tablet Take 1 tablet by mouth once daily (Patient taking differently: Take 10 mg by mouth daily. )  . ferrous sulfate 325 (65 FE) MG tablet Take 325 mg by mouth daily with breakfast.  . glucose blood (ONETOUCH VERIO) test strip Use as instructed to check BG once a [place]  . metFORMIN (GLUCOPHAGE-XR) 500 MG 24 hr tablet TAKE 4 TABLETS BY MOUTH ONCE DAILY (Patient taking differently: Take 2,000 mg by mouth daily with breakfast. )  . Multiple Vitamin (MULTIVITAMIN) tablet Take 1 tablet by mouth daily.    . nitroGLYCERIN (NITROSTAT) 0.4 MG SL tablet Place 1 tablet (0.4 mg total) under the tongue every 5 (five) minutes as needed for chest pain.  . Omega-3 Fatty Acids (FISH OIL) 1000  MG CAPS Take 1,000 mg by mouth daily.   . ranolazine (RANEXA) 500 MG 12 hr tablet Take 1 tablet (500 mg total) by mouth 2 (two) times daily for 30 days.  . rosuvastatin (CRESTOR) 40 MG tablet TAKE ONE TABLET BY MOUTH DAILY AS DIRECTED FOR CHOLESTEROL (Patient taking differently: Take 40 mg by mouth daily. )  . sildenafil (VIAGRA) 100 MG tablet Take 1 tablet (100 mg total) by mouth daily as needed for erectile dysfunction.     Allergies:   Patient has no known allergies.   Social History   Tobacco Use  . Smoking status: Former Smoker    Packs/[place]: 0.25    Years: 20.00    Pack years: 5.00    Types: Cigarettes    Last attempt to quit: 10/07/2013    Years since quitting: 5.2  . Smokeless tobacco: Never Used  . Tobacco comment: quit cigarettes, might have a cigar 1 X month  Substance Use Topics  . Alcohol use: Yes    Alcohol/week: 3.0 standard drinks    Types: 3 Standard drinks or equivalent per week  . Drug use: No     Family Hx: The patient's family history includes Anuerysm in his brother; Diabetes in his maternal grandmother and sister; Hyperlipidemia in his sister and sister; Hypertension in his sister; Kidney disease in his sister. There is no history of Heart attack.  ROS:   Please see the history of present illness.    General:no colds or fevers, no weight changes Skin:no rashes or ulcers HEENT:no blurred vision, no congestion CV:see HPI PUL:see HPI GI:no diarrhea constipation or melena, no indigestion GU:no hematuria, no dysuria MS:no joint pain, no claudication Neuro:no syncope, no lightheadedness Endo + diabetes, no thyroid disease  All other systems reviewed and are negative.   Labs/Other Tests and Data Reviewed:    Recent Labs: 04/08/2018: TSH 1.07 12/12/2018: ALT 52; Hemoglobin 13.5; Platelets 125 12/14/2018: BUN 18; Creatinine, Ser 0.91; Potassium 3.8; Sodium 138   Recent Lipid Panel Lab Results  Component Value Date/Time   CHOL 154 04/08/2018 08:51 AM    CHOL 128 01/19/2018 09:07 AM   TRIG 147.0 04/08/2018 08:51 AM   HDL 52.70 04/08/2018 08:51 AM   HDL 56 01/19/2018 09:07 AM   CHOLHDL 3 04/08/2018 08:51 AM   LDLCALC 72 04/08/2018 08:51 AM   LDLCALC 59 01/19/2018 09:07 AM   LDLDIRECT 204.8  09/05/2013 03:59 PM    Wt Readings from Last 3 Encounters:  12/27/18 181 lb (82.1 kg)  12/14/18 178 lb 8 oz (81 kg)  10/21/18 177 lb 6.4 oz (80.5 kg)     Exam:    Vital Signs:  BP 115/73   Pulse 62   Ht 5' 7.5" (1.715 m)   Wt 181 lb (82.1 kg)   BMI 27.93 kg/m    Well nourished, well developed male in no acute distress. General:Pleasant affect, NAD Skin per pt report --cath site stable though some bruising  HEENT:no congestion with talking, no cough during conversation Lungs:no SOB with talking Abd:no complaints XHB:ZJIRCV lower ext edema,  Neuro:alert and oriented X 3 on phone discussion,  Answers questions appropriately    ASSESSMENT & PLAN:    1.  CAD with hx CABG and graft dysfunction for medical therapy - Ranexa added in the hospital and he is doing well without pain.  Taking his medications as prescribed.  2.  Angina improved with medication addition  3.  HTN controlled he monitors at home  4.  Chronic systolic and diastolic HF now stable.  No SOB no edema per pt.    5.  DM-2 on Jardiance .  His glucose runs 127 before meals and 140 post prandial.   Follow up with Dr. [name] in 3 months reviewed that we were here for him and if he needed to be seen before visit he just needed to call.       COVID-19 Education: The signs and symptoms of COVID-19 were discussed with the patient and how to seek care for testing (follow up with PCP or arrange E-visit).  The importance of social distancing was discussed today.  Patient Risk:   After full review of this patients clinical status, I feel that they are at least moderate risk at this time.  Time:   Today, I have spent 10 minutes with the patient with telehealth technology  discussing His CAD new medication and findings of cath.  His exercise and diet and COVID 19..     Medication Adjustments/Labs and Tests Ordered: Current medicines are reviewed at length with the patient today.  Concerns regarding medicines are outlined above.  Tests Ordered: No orders of the defined types were placed in this encounter.  Medication Changes: No orders of the defined types were placed in this encounter.   Disposition:  Follow up in 3 month(s)  Signed, Cecilie Kicks, NP  12/27/2018 2:09 PM    [facility]

## 2018-12-27 NOTE — Telephone Encounter (Signed)
Called pt re: appt. 01/06/19 with Cecilie Kicks, NP. Was going to see if pt could come into the office today, 3/30 @ 2:900 if pt calls back.

## 2018-12-27 NOTE — Telephone Encounter (Signed)
Virtual Visit Pre-Appointment Phone Call  Steps For Call:  1. Confirm consent - "In the setting of the current Covid19 crisis, you are scheduled for a (phone or video) visit with your provider on (date) at (time).  Just as we do with many in-office visits, in order for you to participate in this visit, we must obtain consent.  If you'd like, I can send this to your mychart (if signed up) or email for you to review.  Otherwise, I can obtain your verbal consent now.  All virtual visits are billed to your insurance company just like a normal visit would be.  By agreeing to a virtual visit, we'd like you to understand that the technology does not allow for your provider to perform an examination, and thus may limit your provider's ability to fully assess your condition.  Finally, though the technology is pretty good, we cannot assure that it will always work on either your or our end, and in the setting of a video visit, we may have to convert it to a phone-only visit.  In either situation, we cannot ensure that we have a secure connection.  Are you willing to proceed?"  2. Give patient instructions for WebEx download to smartphone as below if video visit  3. Advise patient to be prepared with any vital sign or heart rhythm information, their current medicines, and a piece of paper and pen handy for any instructions they may receive the [place] of their visit  4. Inform patient they will receive a phone call 15 minutes prior to their appointment time (may be from unknown caller ID) so they should be prepared to answer  5. Confirm that appointment type is correct in Epic appointment notes (video vs telephone)    TELEPHONE CALL NOTE  Jerome Vaughn has been deemed a candidate for a follow-up tele-health visit to limit community exposure during the Covid-19 pandemic. I spoke with the patient via phone to ensure availability of phone/video source, confirm preferred email & phone number, and discuss  instructions and expectations.  I reminded Jerome Vaughn to be prepared with any vital sign and/or heart rhythm information that could potentially be obtained via home monitoring, at the time of his visit. I reminded Jerome Vaughn to expect a phone call at the time of his visit if his visit.  Did the patient verbally acknowledge consent to treatment?  Jeanann Lewandowsky, [place] 12/27/2018 1:08 PM   DOWNLOADING THE [place], go to [employer] and type in WebEx in the search bar. [place] [employer], the blue/green circle. The app is free but as with any other app downloads, their phone may require them to verify saved payment information or Apple password. The patient does NOT have to create an account.  - If Android, ask patient to go to [employer] and type in WebEx in the search bar. [place] [employer], the blue/green circle. The app is free but as with any other app downloads, their phone may require them to verify saved payment information or Android password. The patient does NOT have to create an account.   CONSENT FOR TELE-HEALTH VISIT - PLEASE REVIEW  I hereby voluntarily request, consent and authorize [facility] and its employed or contracted physicians, physician assistants, nurse practitioners or other licensed health care professionals (the Practitioner), to provide me with telemedicine health care services (the "Services") as deemed necessary by the treating Practitioner. I acknowledge  and consent to receive the Services by the Practitioner via telemedicine. I understand that the telemedicine visit will involve communicating with the Practitioner through live audiovisual communication technology and the disclosure of certain medical information by electronic transmission. I acknowledge that I have been given the opportunity to request an in-person assessment or other available alternative prior to the telemedicine visit and am voluntarily  participating in the telemedicine visit.  I understand that I have the right to withhold or withdraw my consent to the use of telemedicine in the course of my care at any time, without affecting my right to future care or treatment, and that the Practitioner or I may terminate the telemedicine visit at any time. I understand that I have the right to inspect all information obtained and/or recorded in the course of the telemedicine visit and may receive copies of available information for a reasonable fee.  I understand that some of the potential risks of receiving the Services via telemedicine include:  Marland Kitchen Delay or interruption in medical evaluation due to technological equipment failure or disruption; . Information transmitted may not be sufficient (e.g. poor resolution of images) to allow for appropriate medical decision making by the Practitioner; and/or  . In rare instances, security protocols could fail, causing a breach of personal health information.  Furthermore, I acknowledge that it is my responsibility to provide information about my medical history, conditions and care that is complete and accurate to the best of my ability. I acknowledge that Practitioner's advice, recommendations, and/or decision may be based on factors not within their control, such as incomplete or inaccurate data provided by me or distortions of diagnostic images or specimens that may result from electronic transmissions. I understand that the practice of medicine is not an exact science and that Practitioner makes no warranties or guarantees regarding treatment outcomes. I acknowledge that I will receive a copy of this consent concurrently upon execution via email to the email address I last provided but may also request a printed copy by calling the office of [facility].    I understand that my insurance will be billed for this visit.   I have read or had this consent read to me. . I understand the contents of this  consent, which adequately explains the benefits and risks of the Services being provided via telemedicine.  . I have been provided ample opportunity to ask questions regarding this consent and the Services and have had my questions answered to my satisfaction. . I give my informed consent for the services to be provided through the use of telemedicine in my medical care  By participating in this telemedicine visit I agree to the above.

## 2018-12-27 NOTE — Telephone Encounter (Signed)
-----   Message from Isaiah Serge, NP sent at 12/27/2018  9:11 AM EDT ----- Pt could see me this afternoon at 2 if not been sick with cold.  or virtual visit in afternoon tomorrow,  Wed or on the 9th.  Thanks. Marland Kitchen

## 2018-12-27 NOTE — Telephone Encounter (Signed)
Follow up  ° ° °Patient is returning call.  °

## 2018-12-27 NOTE — Patient Instructions (Signed)
Medication Instructions:  Your physician recommends that you continue on your current medications as directed. Please refer to the Current Medication list given to you today.  If you need a refill on your cardiac medications before your next appointment, please call your pharmacy.   Lab work: None ordered  If you have labs (blood work) drawn today and your tests are completely normal, you will receive your results only by:  [facility] (if you have MyChart) OR  A paper copy in the mail If you have any lab test that is abnormal or we need to change your treatment, we will call you to review the results.  Testing/Procedures: None ordered  Follow-Up: At [facility], you and your health needs are our priority.  As part of our continuing mission to provide you with exceptional heart care, we have created designated Provider Care Teams.  These Care Teams include your primary Cardiologist (physician) and Advanced Practice Providers (APPs -  Physician Assistants and Nurse Practitioners) who all work together to provide you with the care you need, when you need it. You will need a follow up appointment in 3 months.  Please call our office 2 months in advance to schedule this appointment.  You may see Sinclair Grooms, MD or one of the following Advanced Practice Providers on your designated Care Team:   Truitt Merle, NP Cecilie Kicks, NP  Kathyrn Drown, NP  Any Other Special Instructions Will Be Listed Below (If Applicable).  Low-Sodium Eating Plan Sodium, which is an element that makes up salt, helps you maintain a healthy balance of fluids in your body. Too much sodium can increase your blood pressure and cause fluid and waste to be held in your body. Your health care provider or dietitian may recommend following this plan if you have high blood pressure (hypertension), kidney disease, liver disease, or heart failure. Eating less sodium can help lower your blood pressure, reduce  swelling, and protect your heart, liver, and kidneys. What are tips for following this plan? General guidelines  Most people on this plan should limit their sodium intake to 1,500-2,000 mg (milligrams) of sodium each [place]. Reading food labels   The Nutrition Facts label lists the amount of sodium in one serving of the food. If you eat more than one serving, you must multiply the listed amount of sodium by the number of servings.  Choose foods with less than 140 mg of sodium per serving.  Avoid foods with 300 mg of sodium or more per serving. Shopping  Look for lower-sodium products, often labeled as "low-sodium" or "no salt added."  Always check the sodium content even if foods are labeled as "unsalted" or "no salt added".  Buy fresh foods. ? Avoid canned foods and premade or frozen meals. ? Avoid canned, cured, or processed meats  Buy breads that have less than 80 mg of sodium per slice. Cooking  Eat more home-cooked food and less restaurant, buffet, and fast food.  Avoid adding salt when cooking. Use salt-free seasonings or herbs instead of table salt or sea salt. Check with your health care provider or pharmacist before using salt substitutes.  Cook with plant-based oils, such as canola, sunflower, or olive oil. Meal planning  When eating at a restaurant, ask that your food be prepared with less salt or no salt, if possible.  Avoid foods that contain MSG (monosodium glutamate). MSG is sometimes added to [place] food, bouillon, and some canned foods. What foods are recommended? The items  listed may not be a complete list. Talk with your dietitian about what dietary choices are best for you. Grains Low-sodium cereals, including oats, puffed wheat and rice, and shredded wheat. Low-sodium crackers. Unsalted rice. Unsalted pasta. Low-sodium bread. Whole-grain breads and whole-grain pasta. Vegetables Fresh or frozen vegetables. "No salt added" canned vegetables. "No salt added"  tomato sauce and paste. Low-sodium or reduced-sodium tomato and vegetable juice. Fruits Fresh, frozen, or canned fruit. Fruit juice. Meats and other protein foods Fresh or frozen (no salt added) meat, poultry, seafood, and fish. Low-sodium canned tuna and salmon. Unsalted nuts. Dried peas, beans, and lentils without added salt. Unsalted canned beans. Eggs. Unsalted nut butters. Dairy Milk. Soy milk. Cheese that is naturally low in sodium, such as ricotta cheese, fresh mozzarella, or Swiss cheese Low-sodium or reduced-sodium cheese. Cream cheese. Yogurt. Fats and oils Unsalted butter. Unsalted margarine with no trans fat. Vegetable oils such as canola or olive oils. Seasonings and other foods Fresh and dried herbs and spices. Salt-free seasonings. Low-sodium mustard and ketchup. Sodium-free salad dressing. Sodium-free light mayonnaise. Fresh or refrigerated horseradish. Lemon juice. Vinegar. Homemade, reduced-sodium, or low-sodium soups. Unsalted popcorn and pretzels. Low-salt or salt-free chips. What foods are not recommended? The items listed may not be a complete list. Talk with your dietitian about what dietary choices are best for you. Grains Instant hot cereals. Bread stuffing, pancake, and biscuit mixes. Croutons. Seasoned rice or pasta mixes. Noodle soup cups. Boxed or frozen macaroni and cheese. Regular salted crackers. Self-rising flour. Vegetables Sauerkraut, pickled vegetables, and relishes. Olives. [place] fries. Onion rings. Regular canned vegetables (not low-sodium or reduced-sodium). Regular canned tomato sauce and paste (not low-sodium or reduced-sodium). Regular tomato and vegetable juice (not low-sodium or reduced-sodium). Frozen vegetables in sauces. Meats and other protein foods Meat or fish that is salted, canned, smoked, spiced, or pickled. Bacon, ham, sausage, hotdogs, corned beef, chipped beef, packaged lunch meats, salt pork, jerky, pickled herring, anchovies, regular canned  tuna, sardines, salted nuts. Dairy Processed cheese and cheese spreads. Cheese curds. Blue cheese. Feta cheese. String cheese. Regular cottage cheese. Buttermilk. Canned milk. Fats and oils Salted butter. Regular margarine. Ghee. Bacon fat. Seasonings and other foods Onion salt, garlic salt, seasoned salt, table salt, and sea salt. Canned and packaged gravies. Worcestershire sauce. Tartar sauce. Barbecue sauce. Teriyaki sauce. Soy sauce, including reduced-sodium. Steak sauce. Fish sauce. Oyster sauce. Cocktail sauce. Horseradish that you find on the shelf. Regular ketchup and mustard. Meat flavorings and tenderizers. Bouillon cubes. Hot sauce and Tabasco sauce. Premade or packaged marinades. Premade or packaged taco seasonings. Relishes. Regular salad dressings. Salsa. Potato and tortilla chips. Corn chips and puffs. Salted popcorn and pretzels. Canned or dried soups. Pizza. Frozen entrees and pot pies. Summary  Eating less sodium can help lower your blood pressure, reduce swelling, and protect your heart, liver, and kidneys.  Most people on this plan should limit their sodium intake to 1,500-2,000 mg (milligrams) of sodium each [place].  Canned, boxed, and frozen foods are high in sodium. Restaurant foods, fast foods, and pizza are also very high in sodium. You also get sodium by adding salt to food.  Try to cook at home, eat more fresh fruits and vegetables, and eat less fast food, canned, processed, or prepared foods. This information is not intended to replace advice given to you by your health care provider. Make sure you discuss any questions you have with your health care provider. Document Released: 03/07/2002 Document Revised: 09/08/2016 Document Reviewed: 09/08/2016 Elsevier Interactive Patient  Education  2019 [employer].

## 2018-12-28 ENCOUNTER — Other Ambulatory Visit: Payer: Self-pay | Admitting: Endocrinology

## 2019-01-06 ENCOUNTER — Ambulatory Visit: Payer: 59 | Admitting: Cardiology

## 2019-01-26 ENCOUNTER — Other Ambulatory Visit: Payer: Self-pay | Admitting: Interventional Cardiology

## 2019-01-26 MED ORDER — RANOLAZINE ER 500 MG PO TB12: 500.0000 mg | ORAL_TABLET | Freq: Two times a day (BID) | ORAL | 3 refills | Status: DC

## 2019-01-26 NOTE — Telephone Encounter (Signed)
Pt's medication was sent to pt's pharmacy as requested. Confirmation received.  °

## 2019-02-16 ENCOUNTER — Other Ambulatory Visit: Payer: Self-pay | Admitting: Interventional Cardiology

## 2019-02-21 ENCOUNTER — Other Ambulatory Visit: Payer: Self-pay | Admitting: Family Medicine

## 2019-02-25 ENCOUNTER — Other Ambulatory Visit: Payer: Self-pay

## 2019-03-01 ENCOUNTER — Ambulatory Visit: Payer: 59 | Admitting: Endocrinology

## 2019-03-08 ENCOUNTER — Other Ambulatory Visit: Payer: Self-pay

## 2019-03-08 ENCOUNTER — Ambulatory Visit (INDEPENDENT_AMBULATORY_CARE_PROVIDER_SITE_OTHER): Payer: Medicare Other | Admitting: Endocrinology

## 2019-03-08 ENCOUNTER — Encounter: Payer: Self-pay | Admitting: Endocrinology

## 2019-03-08 VITALS — BP 130/80 | HR 75 | Temp 97.8°F | Wt 183.2 lb

## 2019-03-08 DIAGNOSIS — E118 Type 2 diabetes mellitus with unspecified complications: Secondary | ICD-10-CM

## 2019-03-08 DIAGNOSIS — R609 Edema, unspecified: Secondary | ICD-10-CM

## 2019-03-08 DIAGNOSIS — E1159 Type 2 diabetes mellitus with other circulatory complications: Secondary | ICD-10-CM

## 2019-03-08 LAB — POCT GLYCOSYLATED HEMOGLOBIN (HGB A1C): Hemoglobin A1C: 8.1 % — AB (ref 4.0–5.6)

## 2019-03-08 MED ORDER — SEMAGLUTIDE(0.25 OR 0.5MG/DOS) 2 MG/1.5ML ~~LOC~~ SOPN: 0.2500 mg | PEN_INJECTOR | SUBCUTANEOUS | 11 refills | Status: DC

## 2019-03-08 MED ORDER — GLUCOSE BLOOD VI STRP: 1.0000 | ORAL_STRIP | Freq: Every day | 12 refills | Status: DC

## 2019-03-08 NOTE — Progress Notes (Signed)
Subjective:    Patient ID: Jerome Vaughn, male    DOB: [birth year], 68 y.o.   MRN: [id number]  HPI Pt returns for f/u of diabetes mellitus:  DM type: 2 Dx'ed: 1638 Complications: CAD and renal insuff.   Therapy: 4 oral meds.  DKA: never.  Severe hypoglycemia: never.  Pancreatitis: never.  Other: he has never been on insulin, but he has learned how; he stopped pioglitizone, due to edema.   Interval history: pt states he feels well in general.  He stopped [place], due to cost.  Pt says cbg varies from 117-160.   Past Medical History:  Diagnosis Date  . Carpal tunnel syndrome    both hands  . Coronary artery disease    a. Cath 11/21/2015: Multivessel CAD --> CABG recommended. b. CABG on 11/22/2015:  LIMA-LAD, SVG-Diag, SVG-OM1 and distal Cx, and SVG-PDA.  . Diabetes mellitus   . History of echocardiogram    a. Echo 4/17: Moderate LVH, EF 50-55%, mild LAE, no pericardial effusion  . Hyperlipidemia   . Hypertension     Past Surgical History:  Procedure Laterality Date  . CARDIAC CATHETERIZATION N/A 11/21/2015   Procedure: Left Heart Cath and Coronary Angiography;  Surgeon: [name], MD;  Location: [facility];  Service: Cardiovascular;  Laterality: N/A;  . colonsocopy  12/07/2009   per Dr. [name], benign polyps, repeat in 10 yrs   . CORONARY ARTERY BYPASS GRAFT N/A 11/22/2015   Procedure: CORONARY ARTERY BYPASS GRAFTING (CABG) times five using the left internal mammary, right greater saphenous vein EVH, and left thigh greater saphenous vein EVH;  Surgeon: [name], MD;  Location: [facility];  Service: Open Heart Surgery;  Laterality: N/A;  . frozen shoulder release    . INTRAVASCULAR PRESSURE WIRE/FFR STUDY N/A 12/14/2018   Procedure: INTRAVASCULAR PRESSURE WIRE/FFR STUDY;  Surgeon: Jettie Booze, MD;  Location: [facility];  Service: Cardiovascular;  Laterality: N/A;  . LEFT HEART CATH AND CORS/GRAFTS ANGIOGRAPHY N/A 10/27/2017   Procedure: LEFT HEART CATH  AND CORS/GRAFTS ANGIOGRAPHY;  Surgeon: [name], MD;  Location: [facility];  Service: Cardiovascular;  Laterality: N/A;  . LEFT HEART CATH AND CORS/GRAFTS ANGIOGRAPHY N/A 12/14/2018   Procedure: LEFT HEART CATH AND CORS/GRAFTS ANGIOGRAPHY;  Surgeon: Jettie Booze, MD;  Location: [facility];  Service: Cardiovascular;  Laterality: N/A;  . TEE WITHOUT CARDIOVERSION N/A 11/22/2015   Procedure: TRANSESOPHAGEAL ECHOCARDIOGRAM (TEE);  Surgeon: [name], MD;  Location: [facility];  Service: Open Heart Surgery;  Laterality: N/A;  . WRIST GANGLION EXCISION      Social History   Socioeconomic History  . Marital status: Married    Spouse name: Not on file  . Number of children: 5  . Years of education: Not on file  . Highest education level: Not on file  Occupational History  . Occupation: Counsellor  Social Needs  . Financial resource strain: Not on file  . Food insecurity:    Worry: Not on file    Inability: Not on file  . Transportation needs:    Medical: Not on file    Non-medical: Not on file  Tobacco Use  . Smoking status: Former Smoker    Packs/Erichsen: 0.25    Years: 20.00    Pack years: 5.00    Types: Cigarettes    Last attempt to quit: 10/07/2013    Years since quitting: 5.4  . Smokeless tobacco: Never Used  . Tobacco  comment: quit cigarettes, might have a cigar 1 X month  Substance and Sexual Activity  . Alcohol use: Yes    Alcohol/week: 3.0 standard drinks    Types: 3 Standard drinks or equivalent per week  . Drug use: No  . Sexual activity: Not Currently    Birth control/protection: None  Lifestyle  . Physical activity:    Days per week: Not on file    Minutes per session: Not on file  . Stress: Not on file  Relationships  . Social connections:    Talks on phone: Not on file    Gets together: Not on file    Attends religious service: Not on file    Active member of club or organization: Not on file    Attends meetings of clubs or  organizations: Not on file    Relationship status: Not on file  . Intimate partner violence:    Fear of current or ex partner: Not on file    Emotionally abused: Not on file    Physically abused: Not on file    Forced sexual activity: Not on file  Other Topics Concern  . 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 acetaminophen (TYLENOL) 500 MG tablet Take 1 tablet (500 mg total) by mouth every 6 (six) hours as needed. (Patient taking differently: Take 500 mg by mouth every 6 (six) hours as needed for mild pain, moderate pain or headache. ) 30 tablet 0  . aspirin EC 81 MG tablet Take 1 tablet (81 mg total) by mouth daily.    . benazepril (LOTENSIN) 20 MG tablet Take 1 tablet by mouth once daily 90 tablet 0  . Blood Glucose Monitoring Suppl (ONETOUCH VERIO FLEX SYSTEM) w/Device KIT USE AS DIRECTED 1 kit 0  . bromocriptine (PARLODEL) 2.5 MG tablet Take 1 tablet by mouth once daily 90 tablet 0  . carvedilol (COREG) 3.125 MG tablet Take 1 tablet (3.125 mg total) by mouth 2 (two) times daily with a meal. 180 tablet 3  . Cyanocobalamin (VITAMIN B-12 PO) Take 1 tablet by mouth daily.    . empagliflozin (JARDIANCE) 25 MG TABS tablet TAKE 1 TABLET BY MOUTH ONCE DAILY (Patient taking differently: Take 25 mg by mouth daily. ) 90 tablet 0  . ezetimibe (ZETIA) 10 MG tablet Take 1 tablet by mouth once daily (Patient taking differently: Take 10 mg by mouth daily. ) 30 tablet 10  . ferrous sulfate 325 (65 FE) MG tablet Take 325 mg by mouth daily with breakfast.    . metFORMIN (GLUCOPHAGE-XR) 500 MG 24 hr tablet TAKE 4 TABLETS BY MOUTH ONCE DAILY (Patient taking differently: TAKE 2,000MG DAILY WITH BREAKFAST) 360 tablet 0  . Multiple Vitamin (MULTIVITAMIN) tablet Take 1 tablet  by mouth daily.      . nitroGLYCERIN (NITROSTAT) 0.4 MG SL tablet Place 1 tablet (0.4 mg total) under the tongue every 5 (five) minutes as needed for chest pain. 25 tablet 5  . Omega-3 Fatty Acids (FISH OIL) 1000 MG CAPS Take 1,000 mg by mouth daily.     . ranolazine (RANEXA) 500 MG 12 hr tablet Take 1 tablet (500 mg total) by mouth 2 (two) times daily. 180 tablet 3  . rosuvastatin (CRESTOR)  40 MG tablet TAKE ONE TABLET BY MOUTH DAILY AS DIRECTED FOR CHOLESTEROL (Patient taking differently: Take 40 mg by mouth daily. ) 90 tablet 3  . sildenafil (VIAGRA) 100 MG tablet Take 1 tablet (100 mg total) by mouth daily as needed for erectile dysfunction. 10 tablet 11   No current facility-administered medications on file prior to visit.     No Known Allergies  Family History  Problem Relation Age of Onset  . Anuerysm Brother   . Hyperlipidemia Sister   . Diabetes Sister   . Hypertension Sister   . Diabetes Maternal Grandmother   . Hyperlipidemia Sister   . Kidney disease Sister   . Heart attack Neg Hx     BP 130/80 (BP Location: Right Arm, Patient Position: Sitting, Cuff Size: Normal)   Pulse 75   Temp 97.8 F (36.6 C) (Oral)   Wt 183 lb 3.2 oz (83.1 kg)   SpO2 98%   BMI 28.27 kg/m    Review of Systems He denies hypoglycemia.      Objective:   Physical Exam VITAL SIGNS:  See vs page GENERAL: no distress Pulses: dorsalis pedis intact bilat.   MSK: no deformity of the feet CV: trace bilat leg edema Skin:  no ulcer on the feet.  normal color and temp on the feet. Neuro: sensation is intact to touch on the feet.    A1c=8.1%  Lab Results  Component Value Date   CREATININE 0.91 12/14/2018   BUN 18 12/14/2018   NA 138 12/14/2018   K 3.8 12/14/2018   CL 110 12/14/2018   CO2 22 12/14/2018       Assessment & Plan:  Type 2 DM, with CAD: worse Edema: This limits rx options   Patient Instructions  check your blood sugar once a Madej.  vary the time of Rout when you check,  between before the 3 meals, and at bedtime.  also check if you have symptoms of your blood sugar being too high or too low.  please keep a record of the readings and bring it to your next appointment here (or you can bring the meter itself).  You can write it on any piece of paper.  please call us sooner if your blood sugar goes below 70, or if you have a lot of readings over 200. I have sent a prescription to your pharmacy, to add "Ozempic."  Please call if your insurance prefers an alternative.   Please come back for a follow-up appointment in 2-3 months.

## 2019-03-08 NOTE — Patient Instructions (Addendum)
check your blood sugar once a [place].  vary the time of Roback when you check, between before the 3 meals, and at bedtime.  also check if you have symptoms of your blood sugar being too high or too low.  please keep a record of the readings and bring it to your next appointment here (or you can bring the meter itself).  You can write it on any piece of paper.  please call us sooner if your blood sugar goes below 70, or if you have a lot of readings over 200. I have sent a prescription to your pharmacy, to add "Ozempic."  Please call if your insurance prefers an alternative.   Please come back for a follow-up appointment in 2-3 months.

## 2019-03-18 ENCOUNTER — Other Ambulatory Visit: Payer: Self-pay

## 2019-03-18 ENCOUNTER — Telehealth: Payer: Self-pay | Admitting: Endocrinology

## 2019-03-18 DIAGNOSIS — E118 Type 2 diabetes mellitus with unspecified complications: Secondary | ICD-10-CM

## 2019-03-18 MED ORDER — METFORMIN HCL 500 MG PO TABS: ORAL_TABLET | ORAL | 3 refills | Status: DC

## 2019-03-18 NOTE — Telephone Encounter (Signed)
metFORMIN (GLUCOPHAGE) 500 MG tablet 180 tablet 3 03/18/2019    Sig: Take 4 tablets by mouth daily   Sent to pharmacy as: metFORMIN (GLUCOPHAGE) 500 MG tablet   E-Prescribing Status: Receipt confirmed by pharmacy (03/18/2019 8:27 AM EDT)    Also advised pt to call his insurance company to determine what medication would be most affordable that is similar and lower tier than Ozempic. Advised he call with this information and a new Rx can be considered by Dr. Loanne Drilling. Verbalized acceptance and understanding.

## 2019-03-18 NOTE — Telephone Encounter (Signed)
Patient called re: patient was notified by Pharmacy that Metformin has been recalled. Pharmacy told patient to call Dr. Loanne Drilling to seek an alternative medication. Also, RX for Ozempic-patient cannot afford Ozempic ($800) and is requesting an alternative medication. Please call patient at ph# [phone] to advise.

## 2019-03-19 ENCOUNTER — Other Ambulatory Visit: Payer: Self-pay | Admitting: Endocrinology

## 2019-03-28 ENCOUNTER — Other Ambulatory Visit: Payer: Self-pay | Admitting: Endocrinology

## 2019-04-11 ENCOUNTER — Other Ambulatory Visit: Payer: Self-pay

## 2019-04-11 ENCOUNTER — Encounter: Payer: Self-pay | Admitting: Family Medicine

## 2019-04-11 ENCOUNTER — Ambulatory Visit (INDEPENDENT_AMBULATORY_CARE_PROVIDER_SITE_OTHER): Payer: Medicare HMO | Admitting: Family Medicine

## 2019-04-11 VITALS — BP 116/78 | HR 80 | Temp 97.9°F | Wt 186.0 lb

## 2019-04-11 DIAGNOSIS — Z0000 Encounter for general adult medical examination without abnormal findings: Secondary | ICD-10-CM

## 2019-04-11 LAB — POC URINALSYSI DIPSTICK (AUTOMATED)
Bilirubin, UA: NEGATIVE
Blood, UA: NEGATIVE
Glucose, UA: NEGATIVE
Ketones, UA: NEGATIVE
Leukocytes, UA: NEGATIVE
Nitrite, UA: NEGATIVE
Protein, UA: POSITIVE — AB
Spec Grav, UA: >=1.03 — AB (ref 1.010–1.025)
Urobilinogen, UA: 0.2 E.U./dL
pH, UA: 6 (ref 5.0–8.0)

## 2019-04-11 LAB — LIPID PANEL
Cholesterol: 132 mg/dL (ref 0–200)
HDL: 55.3 mg/dL (ref >39.00)
LDL Cholesterol: 48 mg/dL (ref 0–99)
NonHDL: 76.9
Total CHOL/HDL Ratio: 2
Triglycerides: 144 mg/dL (ref 0.0–149.0)
VLDL: 28.8 mg/dL (ref 0.0–40.0)

## 2019-04-11 LAB — CBC WITH DIFFERENTIAL/PLATELET
Basophils Absolute: 0 10*3/uL (ref 0.0–0.1)
Basophils Relative: 0.7 % (ref 0.0–3.0)
Eosinophils Absolute: 0 10*3/uL (ref 0.0–0.7)
Eosinophils Relative: 0.9 % (ref 0.0–5.0)
HCT: 35.3 % — ABNORMAL LOW (ref 39.0–52.0)
Hemoglobin: 12.1 g/dL — ABNORMAL LOW (ref 13.0–17.0)
Lymphocytes Relative: 29.6 % (ref 12.0–46.0)
Lymphs Abs: 0.9 10*3/uL (ref 0.7–4.0)
MCHC: 34.2 g/dL (ref 30.0–36.0)
MCV: 90.4 fl (ref 78.0–100.0)
Monocytes Absolute: 0.3 10*3/uL (ref 0.1–1.0)
Monocytes Relative: 9.8 % (ref 3.0–12.0)
Neutro Abs: 1.8 10*3/uL (ref 1.4–7.7)
Neutrophils Relative %: 59 % (ref 43.0–77.0)
Platelets: 130 10*3/uL — ABNORMAL LOW (ref 150.0–400.0)
RBC: 3.91 Mil/uL — ABNORMAL LOW (ref 4.22–5.81)
RDW: 13.9 % (ref 11.5–15.5)
WBC: 3.1 10*3/uL — ABNORMAL LOW (ref 4.0–10.5)

## 2019-04-11 LAB — HEPATIC FUNCTION PANEL
ALT: 54 U/L — ABNORMAL HIGH (ref 0–53)
AST: 56 U/L — ABNORMAL HIGH (ref 0–37)
Albumin: 4.1 g/dL (ref 3.5–5.2)
Alkaline Phosphatase: 29 U/L — ABNORMAL LOW (ref 39–117)
Bilirubin, Direct: 0.1 mg/dL (ref 0.0–0.3)
Total Bilirubin: 0.6 mg/dL (ref 0.2–1.2)
Total Protein: 6.9 g/dL (ref 6.0–8.3)

## 2019-04-11 LAB — BASIC METABOLIC PANEL
BUN: 15 mg/dL (ref 6–23)
CO2: 24 mEq/L (ref 19–32)
Calcium: 8.6 mg/dL (ref 8.4–10.5)
Chloride: 106 mEq/L (ref 96–112)
Creatinine, Ser: 1.09 mg/dL (ref 0.40–1.50)
GFR: 81.97 mL/min (ref >60.00)
Glucose, Bld: 180 mg/dL — ABNORMAL HIGH (ref 70–99)
Potassium: 4.2 mEq/L (ref 3.5–5.1)
Sodium: 138 mEq/L (ref 135–145)

## 2019-04-11 LAB — PSA: PSA: 0.21 ng/mL (ref 0.10–4.00)

## 2019-04-11 LAB — TSH: TSH: 0.63 u[IU]/mL (ref 0.35–4.50)

## 2019-04-11 MED ORDER — ROSUVASTATIN CALCIUM 40 MG PO TABS: 40.0000 mg | ORAL_TABLET | Freq: Every day | ORAL | 3 refills | Status: DC

## 2019-04-11 MED ORDER — SILDENAFIL CITRATE 100 MG PO TABS: 100.0000 mg | ORAL_TABLET | Freq: Every day | ORAL | 11 refills | Status: DC | PRN

## 2019-04-11 NOTE — Progress Notes (Signed)
Subjective:    Patient ID: Jerome Vaughn, male    DOB: 03-09-1951, 68 y.o.   MRN: [id number]  HPI Here for a well exam. He feels fine. His A1c a few weeks ago was up to 8.1, so he and Dr. Loanne Drilling are working on that. Of note, he says he recently tried to donate platelets at the [facility] and they turned him away, saying his platelets were too low. He denies any sort of bleeding or bruising issues. In March his platelet count was 125, and he has been borderline low off and on for several years. His other cell counts are normal.    Review of Systems  Constitutional: Negative.   HENT: Negative.   Eyes: Negative.   Respiratory: Negative.   Cardiovascular: Negative.   Gastrointestinal: Negative.   Genitourinary: Negative.   Musculoskeletal: Negative.   Skin: Negative.   Neurological: Negative.   Psychiatric/Behavioral: Negative.        Objective:   Physical Exam Constitutional:      General: He is not in acute distress.    Appearance: He is well-developed. He is not diaphoretic.  HENT:     Head: Normocephalic and atraumatic.     Right Ear: External ear normal.     Left Ear: External ear normal.     Nose: Nose normal.     Mouth/Throat:     Pharynx: No oropharyngeal exudate.  Eyes:     General: No scleral icterus.       Right eye: No discharge.        Left eye: No discharge.     Conjunctiva/sclera: Conjunctivae normal.     Pupils: Pupils are equal, round, and reactive to light.  Neck:     Musculoskeletal: Neck supple.     Thyroid: No thyromegaly.     Vascular: No JVD.     Trachea: No tracheal deviation.  Cardiovascular:     Rate and Rhythm: Normal rate and regular rhythm.     Heart sounds: Normal heart sounds. No murmur. No friction rub. No gallop.   Pulmonary:     Effort: Pulmonary effort is normal. No respiratory distress.     Breath sounds: Normal breath sounds. No wheezing or rales.  Chest:     Chest wall: No tenderness.  Abdominal:     General: Bowel sounds are  normal. There is no distension.     Palpations: Abdomen is soft. There is no mass.     Tenderness: There is no abdominal tenderness. There is no guarding or rebound.  Genitourinary:    Penis: Normal. No tenderness.      Scrotum/Testes: Normal.     Prostate: Normal.     Rectum: Normal. Guaiac result negative.  Musculoskeletal: Normal range of motion.        General: No tenderness.  Lymphadenopathy:     Cervical: No cervical adenopathy.  Skin:    General: Skin is warm and dry.     Coloration: Skin is not pale.     Findings: No erythema or rash.  Neurological:     Mental Status: He is alert and oriented to person, place, and time.     Cranial Nerves: No cranial nerve deficit.     Motor: No abnormal muscle tone.     Coordination: Coordination normal.     Deep Tendon Reflexes: Reflexes are normal and symmetric. Reflexes normal.  Psychiatric:        Behavior: Behavior normal.  Thought Content: Thought content normal.        Judgment: Judgment normal.           Assessment & Plan:  Well exam. We discussed diet and exercise. Get fasting labs. He may have ITP, so we will check another CBC today to evaluate.  Alysia Penna, MD

## 2019-05-26 ENCOUNTER — Other Ambulatory Visit: Payer: Self-pay | Admitting: Interventional Cardiology

## 2019-05-26 ENCOUNTER — Other Ambulatory Visit: Payer: Self-pay

## 2019-05-26 DIAGNOSIS — E118 Type 2 diabetes mellitus with unspecified complications: Secondary | ICD-10-CM

## 2019-05-26 MED ORDER — BENAZEPRIL HCL 20 MG PO TABS: 20.0000 mg | ORAL_TABLET | Freq: Every day | ORAL | 1 refills | Status: DC

## 2019-05-26 MED ORDER — METFORMIN HCL 500 MG PO TABS: ORAL_TABLET | ORAL | 2 refills | Status: DC

## 2019-05-26 MED ORDER — ACCU-CHEK SOFTCLIX LANCETS MISC: 1.0000 | Freq: Every day | 2 refills | Status: DC

## 2019-05-26 MED ORDER — ACCU-CHEK AVIVA PLUS VI STRP: 1.0000 | ORAL_STRIP | Freq: Every day | 2 refills | Status: DC

## 2019-05-26 MED ORDER — NITROGLYCERIN 0.4 MG SL SUBL: 0.4000 mg | SUBLINGUAL_TABLET | SUBLINGUAL | 1 refills | Status: DC | PRN

## 2019-05-26 MED ORDER — ACCU-CHEK AVIVA PLUS W/DEVICE KIT: 1.0000 | PACK | Freq: Every day | 0 refills | Status: DC

## 2019-05-26 MED ORDER — CARVEDILOL 3.125 MG PO TABS: 3.1250 mg | ORAL_TABLET | Freq: Two times a day (BID) | ORAL | 1 refills | Status: DC

## 2019-05-30 ENCOUNTER — Telehealth: Payer: Self-pay | Admitting: Endocrinology

## 2019-05-30 NOTE — Telephone Encounter (Signed)
Please proceed, Thanks  [name], [name]  Patient HM Schedule Request Pool 17 minutes ago (10:17 AM)     Appointment Request From: Jerome Vaughn  With Provider: [name], MD [facility]]  Preferred Date Range: From 05/30/2019 To 06/07/2019  Preferred Times: Tuesday Morning  Reason: To address the following health maintenance concerns. Influenza Vaccine  Comments: Would like to receive Flu shot  during my upcoming routine follow up exam on 06/07/2019  at 8:45 am

## 2019-06-02 ENCOUNTER — Other Ambulatory Visit: Payer: Self-pay

## 2019-06-03 ENCOUNTER — Other Ambulatory Visit: Payer: Self-pay

## 2019-06-03 DIAGNOSIS — E118 Type 2 diabetes mellitus with unspecified complications: Secondary | ICD-10-CM

## 2019-06-03 MED ORDER — METFORMIN HCL 500 MG PO TABS: ORAL_TABLET | ORAL | 2 refills | Status: DC

## 2019-06-07 ENCOUNTER — Other Ambulatory Visit: Payer: Self-pay

## 2019-06-07 ENCOUNTER — Ambulatory Visit: Payer: Medicare HMO | Admitting: Endocrinology

## 2019-06-07 ENCOUNTER — Encounter: Payer: Self-pay | Admitting: Endocrinology

## 2019-06-07 VITALS — BP 118/82 | HR 67 | Temp 97.8°F | Ht 67.5 in | Wt 193.2 lb

## 2019-06-07 DIAGNOSIS — E118 Type 2 diabetes mellitus with unspecified complications: Secondary | ICD-10-CM

## 2019-06-07 DIAGNOSIS — Z23 Encounter for immunization: Secondary | ICD-10-CM

## 2019-06-07 LAB — POCT GLYCOSYLATED HEMOGLOBIN (HGB A1C): Hemoglobin A1C: 7.3 % — AB (ref 4.0–5.6)

## 2019-06-07 MED ORDER — REPAGLINIDE 0.5 MG PO TABS: 0.5000 mg | ORAL_TABLET | Freq: Every day | ORAL | 3 refills | Status: DC

## 2019-06-07 NOTE — Patient Instructions (Addendum)
check your blood sugar once a Routon.  vary the time of Shew when you check, between before the 3 meals, and at bedtime.  also check if you have symptoms of your blood sugar being too high or too low.  please keep a record of the readings and bring it to your next appointment here (or you can bring the meter itself).  You can write it on any piece of paper.  please call us sooner if your blood sugar goes below 70, or if you have a lot of readings over 200. I have sent a prescription to your pharmacy, to add "repaglinide."  You would take this with your evenig meal.  Please come back for a follow-up appointment in 2-3 months.

## 2019-06-07 NOTE — Progress Notes (Signed)
Subjective:    Patient ID: Jerome Vaughn, male    DOB: [birth year], 68 y.o.   MRN: [id number]  HPI Pt returns for f/u of diabetes mellitus:  DM type: 2 Dx'ed: 7001 Complications: CAD and renal insuff.   Therapy: 4 oral meds.  DKA: never.  Severe hypoglycemia: never.  Pancreatitis: never.  Other: he has never been on insulin, but he has learned how; he stopped pioglitizone, due to edema.   Interval history: pt states he feels well in general.  Pt stopped Jardiance, Ozempic, and Januvia, due to cost.   For DM, he takes just bromocriptine and metformin.  Pt says cbg's are in the 100's.   Past Medical History:  Diagnosis Date  . Carpal tunnel syndrome    both hands  . Coronary artery disease    a. Cath 11/21/2015: Multivessel CAD --> CABG recommended. b. CABG on 11/22/2015:  LIMA-LAD, SVG-Diag, SVG-OM1 and distal Cx, and SVG-PDA.  . Diabetes mellitus   . History of echocardiogram    a. Echo 4/17: Moderate LVH, EF 50-55%, mild LAE, no pericardial effusion  . Hyperlipidemia   . Hypertension     Past Surgical History:  Procedure Laterality Date  . CARDIAC CATHETERIZATION N/A 11/21/2015   Procedure: Left Heart Cath and Coronary Angiography;  Surgeon: [name], MD;  Location: [facility];  Service: Cardiovascular;  Laterality: N/A;  . colonsocopy  12/07/2009   per Dr. [name], benign polyps, repeat in 10 yrs   . CORONARY ARTERY BYPASS GRAFT N/A 11/22/2015   Procedure: CORONARY ARTERY BYPASS GRAFTING (CABG) times five using the left internal mammary, right greater saphenous vein EVH, and left thigh greater saphenous vein EVH;  Surgeon: [name], MD;  Location: [facility];  Service: Open Heart Surgery;  Laterality: N/A;  . frozen shoulder release    . INTRAVASCULAR PRESSURE WIRE/FFR STUDY N/A 12/14/2018   Procedure: INTRAVASCULAR PRESSURE WIRE/FFR STUDY;  Surgeon: Jettie Booze, MD;  Location: [facility];  Service: Cardiovascular;  Laterality: N/A;  . LEFT HEART  CATH AND CORS/GRAFTS ANGIOGRAPHY N/A 10/27/2017   Procedure: LEFT HEART CATH AND CORS/GRAFTS ANGIOGRAPHY;  Surgeon: [name], MD;  Location: [facility];  Service: Cardiovascular;  Laterality: N/A;  . LEFT HEART CATH AND CORS/GRAFTS ANGIOGRAPHY N/A 12/14/2018   Procedure: LEFT HEART CATH AND CORS/GRAFTS ANGIOGRAPHY;  Surgeon: Jettie Booze, MD;  Location: [facility];  Service: Cardiovascular;  Laterality: N/A;  . TEE WITHOUT CARDIOVERSION N/A 11/22/2015   Procedure: TRANSESOPHAGEAL ECHOCARDIOGRAM (TEE);  Surgeon: [name], MD;  Location: [facility];  Service: Open Heart Surgery;  Laterality: N/A;  . WRIST GANGLION EXCISION      Social History   Socioeconomic History  . Marital status: Married    Spouse name: Not on file  . Number of children: 5  . Years of education: Not on file  . Highest education level: Not on file  Occupational History  . Occupation: Counsellor  Social Needs  . Financial resource strain: Not on file  . Food insecurity    Worry: Not on file    Inability: Not on file  . Transportation needs    Medical: Not on file    Non-medical: Not on file  Tobacco Use  . Smoking status: Former Smoker    Packs/[place]: 0.25    Years: 20.00    Pack years: 5.00    Types: Cigarettes    Quit date: 10/07/2013    Years  since quitting: 5.6  . Smokeless tobacco: Never Used  . Tobacco comment: quit cigarettes, might have a cigar 1 X month  Substance and Sexual Activity  . Alcohol use: Yes    Alcohol/week: 3.0 standard drinks    Types: 3 Standard drinks or equivalent per week  . Drug use: No  . Sexual activity: Not Currently    Birth control/protection: None  Lifestyle  . Physical activity    Days per week: Not on file    Minutes per session: Not on file  . Stress: Not on file  Relationships  . Social Herbalist on phone: Not on file    Gets together: Not on file    Attends religious service: Not on file    Active member of club or  organization: Not on file    Attends meetings of clubs or organizations: Not on file    Relationship status: Not on file  . Intimate partner violence    Fear of current or ex partner: Not on file    Emotionally abused: Not on file    Physically abused: Not on file    Forced sexual activity: Not on file  Other Topics Concern  . 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 Accu-Chek Softclix Lancets lancets 1 each by Other route daily. Use to monitor glucose levels daily; E11.8 100 each 2  . acetaminophen (TYLENOL) 500 MG tablet Take 1 tablet (500 mg total) by mouth every 6 (six) hours as needed. (Patient taking differently: Take 500 mg by mouth every 6 (six) hours as needed for mild pain, moderate pain or headache. ) 30 tablet 0  . aspirin EC 81 MG tablet Take 1 tablet (81 mg total) by mouth daily.    . benazepril (LOTENSIN) 20 MG tablet Take 1 tablet (20 mg total) by mouth daily. 90 tablet 1  . Blood Glucose Monitoring Suppl (ACCU-CHEK AVIVA PLUS) w/Device KIT 1 each by Does not apply route daily. Use to monitor glucose levels daily; E11.8 1 kit 0  . bromocriptine (PARLODEL) 2.5 MG tablet Take 1 tablet by mouth once daily 90 tablet 0  . carvedilol (COREG) 3.125 MG tablet Take 1 tablet (3.125 mg total) by mouth 2 (two) times daily with a meal. 180 tablet 1  . Cyanocobalamin (VITAMIN B-12 PO) Take 1 tablet by mouth daily.    Marland Kitchen ezetimibe (ZETIA) 10 MG tablet Take 1 tablet by mouth once daily (Patient taking differently: Take 10 mg by mouth daily. ) 30 tablet 10  . ferrous sulfate 325 (65 FE) MG tablet Take 325 mg by mouth daily with breakfast.    . glucose blood (ACCU-CHEK AVIVA PLUS) test strip 1 each by Other route daily. Use to monitor glucose levels daily;  E11.8 100 each 2  . metFORMIN (GLUCOPHAGE) 500 MG tablet Take 4 tablets by mouth daily 360 tablet 2  . Multiple Vitamin (MULTIVITAMIN) tablet Take 1 tablet by mouth daily.      . nitroGLYCERIN (NITROSTAT) 0.4 MG SL tablet Place 1 tablet (0.4 mg total) under the tongue every 5 (five) minutes as needed for chest pain. 75 tablet 1  . Omega-3 Fatty Acids (FISH OIL) 1000 MG CAPS Take 1,000 mg by  mouth daily.     . ranolazine (RANEXA) 500 MG 12 hr tablet Take 1 tablet (500 mg total) by mouth 2 (two) times daily. 180 tablet 3  . rosuvastatin (CRESTOR) 40 MG tablet Take 1 tablet (40 mg total) by mouth daily. 90 tablet 3  . sildenafil (VIAGRA) 100 MG tablet Take 1 tablet (100 mg total) by mouth daily as needed for erectile dysfunction. 10 tablet 11   No current facility-administered medications on file prior to visit.     No Known Allergies  Family History  Problem Relation Age of Onset  . Anuerysm Brother   . Hyperlipidemia Sister   . Diabetes Sister   . Hypertension Sister   . Diabetes Maternal Grandmother   . Hyperlipidemia Sister   . Kidney disease Sister   . Heart attack Neg Hx     BP 118/82   Pulse 67   Temp 97.8 F (36.6 C)   Ht 5' 7.5" (1.715 m)   Wt 193 lb 3.2 oz (87.6 kg)   SpO2 98%   BMI 29.81 kg/m   Review of Systems Denies sob    Objective:   Physical Exam VITAL SIGNS:  See vs page GENERAL: no distress Pulses: dorsalis pedis intact bilat.   MSK: no deformity of the feet CV: 1+ right, and trace left leg edema.   Skin:  no ulcer on the feet.  normal color and temp on the feet.   Neuro: sensation is intact to touch on the feet.    Lab Results  Component Value Date   HGBA1C 7.3 (A) 06/07/2019   Lab Results  Component Value Date   CREATININE 1.09 04/11/2019   BUN 15 04/11/2019   NA 138 04/11/2019   K 4.2 04/11/2019   CL 106 04/11/2019   CO2 24 04/11/2019       Assessment & Plan:  Type 2 DM: he needs increased rx, if it can be done with a regimen that  avoids or minimizes hypoglycemia.  Edema: This limits rx options.   Patient Instructions  check your blood sugar once a [place].  vary the time of [place] when you check, between before the 3 meals, and at bedtime.  also check if you have symptoms of your blood sugar being too high or too low.  please keep a record of the readings and bring it to your next appointment here (or you can bring the meter itself).  You can write it on any piece of paper.  please call us sooner if your blood sugar goes below 70, or if you have a lot of readings over 200. I have sent a prescription to your pharmacy, to add "repaglinide."  You would take this with your evenig meal.  Please come back for a follow-up appointment in 2-3 months.

## 2019-07-01 DIAGNOSIS — E119 Type 2 diabetes mellitus without complications: Secondary | ICD-10-CM | POA: Diagnosis not present

## 2019-07-21 ENCOUNTER — Other Ambulatory Visit: Payer: Self-pay | Admitting: Endocrinology

## 2019-09-12 ENCOUNTER — Ambulatory Visit: Payer: Medicare HMO | Admitting: Endocrinology

## 2019-09-13 DIAGNOSIS — Z0100 Encounter for examination of eyes and vision without abnormal findings: Secondary | ICD-10-CM | POA: Diagnosis not present

## 2019-10-05 ENCOUNTER — Other Ambulatory Visit: Payer: Self-pay

## 2019-10-06 ENCOUNTER — Encounter: Payer: Self-pay | Admitting: Endocrinology

## 2019-10-06 ENCOUNTER — Ambulatory Visit: Payer: Medicare HMO | Admitting: Endocrinology

## 2019-10-06 VITALS — BP 132/84 | HR 83 | Ht 67.5 in | Wt 188.2 lb

## 2019-10-06 DIAGNOSIS — E118 Type 2 diabetes mellitus with unspecified complications: Secondary | ICD-10-CM | POA: Diagnosis not present

## 2019-10-06 LAB — POCT GLYCOSYLATED HEMOGLOBIN (HGB A1C): Hemoglobin A1C: 6.5 % — AB (ref 4.0–5.6)

## 2019-10-06 NOTE — Progress Notes (Signed)
Subjective:    Patient ID: Jerome Vaughn, male    DOB: Mar 31, 1951, 69 y.o.   MRN: [id number]  HPI Pt returns for f/u of diabetes mellitus:  DM type: 2 Dx'ed: 6644 Complications: CAD and renal insuff.   Therapy: 3 oral meds.  DKA: never.  Severe hypoglycemia: never.  Pancreatitis: never.  Other: he has never been on insulin, but he has learned how; he stopped pioglitizone, due to edema; he cannot afford name brand meds Interval history: pt states he feels well in general.  Pt says cbg's are well-controlled. He takes meds as rx'ed.   Past Medical History:  Diagnosis Date  . Carpal tunnel syndrome    both hands  . Coronary artery disease    a. Cath 11/21/2015: Multivessel CAD --> CABG recommended. b. CABG on 11/22/2015:  LIMA-LAD, SVG-Diag, SVG-OM1 and distal Cx, and SVG-PDA.  . Diabetes mellitus   . History of echocardiogram    a. Echo 4/17: Moderate LVH, EF 50-55%, mild LAE, no pericardial effusion  . Hyperlipidemia   . Hypertension     Past Surgical History:  Procedure Laterality Date  . CARDIAC CATHETERIZATION N/A 11/21/2015   Procedure: Left Heart Cath and Coronary Angiography;  Surgeon: [name], MD;  Location: [facility];  Service: Cardiovascular;  Laterality: N/A;  . colonsocopy  12/07/2009   per Dr. [name], benign polyps, repeat in 10 yrs   . CORONARY ARTERY BYPASS GRAFT N/A 11/22/2015   Procedure: CORONARY ARTERY BYPASS GRAFTING (CABG) times five using the left internal mammary, right greater saphenous vein EVH, and left thigh greater saphenous vein EVH;  Surgeon: [name], MD;  Location: [facility];  Service: Open Heart Surgery;  Laterality: N/A;  . frozen shoulder release    . INTRAVASCULAR PRESSURE WIRE/FFR STUDY N/A 12/14/2018   Procedure: INTRAVASCULAR PRESSURE WIRE/FFR STUDY;  Surgeon: Jettie Booze, MD;  Location: [facility];  Service: Cardiovascular;  Laterality: N/A;  . LEFT HEART CATH AND CORS/GRAFTS ANGIOGRAPHY N/A 10/27/2017   Procedure: LEFT HEART CATH AND CORS/GRAFTS ANGIOGRAPHY;  Surgeon: [name], MD;  Location: [facility];  Service: Cardiovascular;  Laterality: N/A;  . LEFT HEART CATH AND CORS/GRAFTS ANGIOGRAPHY N/A 12/14/2018   Procedure: LEFT HEART CATH AND CORS/GRAFTS ANGIOGRAPHY;  Surgeon: Jettie Booze, MD;  Location: [facility];  Service: Cardiovascular;  Laterality: N/A;  . TEE WITHOUT CARDIOVERSION N/A 11/22/2015   Procedure: TRANSESOPHAGEAL ECHOCARDIOGRAM (TEE);  Surgeon: [name], MD;  Location: [facility];  Service: Open Heart Surgery;  Laterality: N/A;  . WRIST GANGLION EXCISION      Social History   Socioeconomic History  . Marital status: Married    Spouse name: Not on file  . Number of children: 5  . Years of education: Not on file  . Highest education level: Not on file  Occupational History  . Occupation: Counsellor  Tobacco Use  . Smoking status: Former Smoker    Packs/[place]: 0.25    Years: 20.00    Pack years: 5.00    Types: Cigarettes    Quit date: 10/07/2013    Years since quitting: 6.0  . Smokeless tobacco: Never Used  . Tobacco comment: quit cigarettes, might have a cigar 1 X month  Substance and Sexual Activity  . Alcohol use: Yes    Alcohol/week: 3.0 standard drinks    Types: 3 Standard drinks or equivalent per week  . Drug use: No  . Sexual activity: Not Currently  Birth control/protection: None  Other Topics Concern  . 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 Difficulty of Paying Living Expenses: Not on file  Food Insecurity:   . Worried About Charity fundraiser in the Last Year: Not on file  . Ran Out of Food in the Last Year: Not on file  Transportation Needs:   . Lack of Transportation (Medical): Not on file  . Lack of  Transportation (Non-Medical): Not on file  Physical Activity:   . Days of Exercise per Week: Not on file  . Minutes of Exercise per Session: Not on file  Stress:   . Feeling of Stress : Not on file  Social Connections:   . Frequency of Communication with Friends and Family: Not on file  . Frequency of Social Gatherings with Friends and Family: Not on file  . Attends Religious Services: Not on file  . Active Member of Clubs or Organizations: Not on file  . Attends Archivist Meetings: Not on file  . Marital Status: Not on file  Intimate Partner Violence:   . Fear of Current or Ex-Partner: Not on file  . Emotionally Abused: Not on file  . Physically Abused: Not on file  . Sexually Abused: Not on file    Current Outpatient Medications on File Prior to Visit  Medication Sig Dispense Refill  . Accu-Chek Softclix Lancets lancets 1 each by Other route daily. Use to monitor glucose levels daily; E11.8 100 each 2  . acetaminophen (TYLENOL) 500 MG tablet Take 1 tablet (500 mg total) by mouth every 6 (six) hours as needed. (Patient taking differently: Take 500 mg by mouth every 6 (six) hours as needed for mild pain, moderate pain or headache. ) 30 tablet 0  . aspirin EC 81 MG tablet Take 1 tablet (81 mg total) by mouth daily.    . benazepril (LOTENSIN) 20 MG tablet Take 1 tablet (20 mg total) by mouth daily. 90 tablet 1  . Blood Glucose Monitoring Suppl (ACCU-CHEK AVIVA PLUS) w/Device KIT 1 each by Does not apply route daily. Use to monitor glucose levels daily; E11.8 1 kit 0  . bromocriptine (PARLODEL) 2.5 MG tablet Take 1 tablet by mouth once daily 90 tablet 0  . carvedilol (COREG) 3.125 MG tablet Take 1 tablet (3.125 mg total) by mouth 2 (two) times daily with a meal. 180 tablet 1  . Cyanocobalamin (VITAMIN B-12 PO) Take 1 tablet by mouth daily.    Marland Kitchen ezetimibe (ZETIA) 10 MG tablet Take 1 tablet by mouth once daily (Patient taking differently: Take 10 mg by mouth daily. ) 30 tablet 10    . ferrous sulfate 325 (65 FE) MG tablet Take 325 mg by mouth daily with breakfast.    . glucose blood (ACCU-CHEK AVIVA PLUS) test strip 1 each by Other route daily. Use to monitor glucose levels daily; E11.8 100 each 2  . metFORMIN (GLUCOPHAGE) 500 MG tablet Take 4 tablets by mouth daily 360 tablet 2  . Multiple Vitamin (MULTIVITAMIN) tablet Take 1 tablet by mouth daily.      . nitroGLYCERIN (NITROSTAT) 0.4 MG SL tablet Place 1 tablet (0.4 mg total) under the tongue every 5 (five) minutes as needed for chest pain. 75 tablet 1  .  Omega-3 Fatty Acids (FISH OIL) 1000 MG CAPS Take 1,000 mg by mouth daily.     . ranolazine (RANEXA) 500 MG 12 hr tablet Take 1 tablet (500 mg total) by mouth 2 (two) times daily. 180 tablet 3  . repaglinide (PRANDIN) 0.5 MG tablet Take 1 tablet (0.5 mg total) by mouth daily with supper. 90 tablet 3  . rosuvastatin (CRESTOR) 40 MG tablet Take 1 tablet (40 mg total) by mouth daily. 90 tablet 3  . sildenafil (VIAGRA) 100 MG tablet Take 1 tablet (100 mg total) by mouth daily as needed for erectile dysfunction. 10 tablet 11   No current facility-administered medications on file prior to visit.    No Known Allergies  Family History  Problem Relation Age of Onset  . Anuerysm Brother   . Hyperlipidemia Sister   . Diabetes Sister   . Hypertension Sister   . Diabetes Maternal Grandmother   . Hyperlipidemia Sister   . Kidney disease Sister   . Heart attack Neg Hx     BP 132/84 (BP Location: Right Arm, Patient Position: Sitting, Cuff Size: Normal)   Pulse 83   Ht 5' 7.5" (1.715 m)   Wt 188 lb 3.2 oz (85.4 kg)   SpO2 98%   BMI 29.04 kg/m    Review of Systems He denies hypoglycemia.      Objective:   Physical Exam VITAL SIGNS:  See vs page GENERAL: no distress Pulses: dorsalis pedis intact bilat.   MSK: no deformity of the feet CV: no leg edema Skin:  no ulcer on the feet.  normal color and temp on the feet.  Neuro: sensation is intact to touch on the  feet.  Ext: there is bilateral onychomycosis of the toenails.     Lab Results  Component Value Date   HGBA1C 6.5 (A) 10/06/2019       Assessment & Plan:  Type 2 DM, with CAD: well-controlled Renal insuff: This limits rx options.   Patient Instructions  check your blood sugar once a [place].  vary the time of [place] when you check, between before the 3 meals, and at bedtime.  also check if you have symptoms of your blood sugar being too high or too low.  please keep a record of the readings and bring it to your next appointment here (or you can bring the meter itself).  You can write it on any piece of paper.  please call us sooner if your blood sugar goes below 70, or if you have a lot of readings over 200. Please continue the same medications Please come back for a follow-up appointment in 3-4 months.

## 2019-10-06 NOTE — Patient Instructions (Addendum)
check your blood sugar once a [place].  vary the time of [place] when you check, between before the 3 meals, and at bedtime.  also check if you have symptoms of your blood sugar being too high or too low.  please keep a record of the readings and bring it to your next appointment here (or you can bring the meter itself).  You can write it on any piece of paper.  please call us sooner if your blood sugar goes below 70, or if you have a lot of readings over 200. Please continue the same medications Please come back for a follow-up appointment in 3-4 months.

## 2019-10-20 NOTE — Progress Notes (Signed)
CARDIOLOGY OFFICE NOTE  Date:  10/26/2019    Jerome Vaughn Date of Birth: 14-May-1951 Medical Record #[id number]  PCP:  [name], MD  Cardiologist:  [name]    Chief Complaint  Patient presents with  . Follow-up    Seen for Dr. [name]    History of Present Illness: Jerome Vaughn is a 69 y.o. male who presents today for a follow up visit. Seen for Dr. [name].   He has a hx of CAD with left main involvement s/p CABG x 5, (with LIMA-LAD, SVG-diagonal, SVG to OM1/distal LCx, SVG to PDA in 01/25/75), chronic systolic and diastolic heart failure, hypertension, hyperlipidemia, current smoker, and DM.   Follow up cath 09/2017 with occlusion of SVG to 1st diagonal noted.   He was admitted in March 2020 with chest pain - Pt admitted 12/13/18 with chest pain. Was recathed - noted ostial LM to distal LM lesion 65% stenosed and FR was neg at 0.95. SVG to first diag 100% stenosed as before. Jump graft from OM1 to OM2 was occluded;  SVG to OM1 portion was patent;  SVG to PDA was patent. Ostial PDA disease is unchanged from prior. EF 35-45% but better by echo (45 to 50%) and medical therapy recommended. Ranexa was added - no nitrate due to chronic ED use of Viagra.   Seen by Cecilie Kicks NP for a telehealth visit following that admission - felt to be doing ok.   The patient does not have symptoms concerning for COVID-19 infection (fever, chills, cough, or new shortness of breath).   Comes in today. Here alone. Overall he feels like he is doing well. He will note an occasional "constricting feeling" in his chest - this happens with stress - not exertional. No NTG use. Short lived. Walking most days and walks at work with no issue. No palpitations. A1C is great. Some numbness in his fingers but was told he had carpal tunnel many years ago. Tolerating his medicines. Has lost a few pounds. Not smoking. BP is good. On the wait list for a vaccine.   Past Medical History:  Diagnosis Date  . Carpal tunnel  syndrome    both hands  . Coronary artery disease    a. Cath 11/21/2015: Multivessel CAD --> CABG recommended. b. CABG on 11/22/2015:  LIMA-LAD, SVG-Diag, SVG-OM1 and distal Cx, and SVG-PDA.  . Diabetes mellitus   . History of echocardiogram    a. Echo 4/17: Moderate LVH, EF 50-55%, mild LAE, no pericardial effusion  . Hyperlipidemia   . Hypertension     Past Surgical History:  Procedure Laterality Date  . CARDIAC CATHETERIZATION N/A 11/21/2015   Procedure: Left Heart Cath and Coronary Angiography;  Surgeon: [name], MD;  Location: [facility];  Service: Cardiovascular;  Laterality: N/A;  . colonsocopy  12/07/2009   per Dr. [name], benign polyps, repeat in 10 yrs   . CORONARY ARTERY BYPASS GRAFT N/A 11/22/2015   Procedure: CORONARY ARTERY BYPASS GRAFTING (CABG) times five using the left internal mammary, right greater saphenous vein EVH, and left thigh greater saphenous vein EVH;  Surgeon: [name], MD;  Location: [facility];  Service: Open Heart Surgery;  Laterality: N/A;  . frozen shoulder release    . INTRAVASCULAR PRESSURE WIRE/FFR STUDY N/A 12/14/2018   Procedure: INTRAVASCULAR PRESSURE WIRE/FFR STUDY;  Surgeon: Jettie Booze, MD;  Location: [facility];  Service: Cardiovascular;  Laterality: N/A;  . LEFT HEART CATH AND  CORS/GRAFTS ANGIOGRAPHY N/A 10/27/2017   Procedure: LEFT HEART CATH AND CORS/GRAFTS ANGIOGRAPHY;  Surgeon: [name], MD;  Location: [facility];  Service: Cardiovascular;  Laterality: N/A;  . LEFT HEART CATH AND CORS/GRAFTS ANGIOGRAPHY N/A 12/14/2018   Procedure: LEFT HEART CATH AND CORS/GRAFTS ANGIOGRAPHY;  Surgeon: Jettie Booze, MD;  Location: [facility];  Service: Cardiovascular;  Laterality: N/A;  . TEE WITHOUT CARDIOVERSION N/A 11/22/2015   Procedure: TRANSESOPHAGEAL ECHOCARDIOGRAM (TEE);  Surgeon: [name], MD;  Location: [facility];  Service: Open Heart Surgery;  Laterality: N/A;  . WRIST GANGLION EXCISION        Medications: Current Meds  Medication Sig  . Accu-Chek Softclix Lancets lancets 1 each by Other route daily. Use to monitor glucose levels daily; E11.8  . acetaminophen (TYLENOL) 500 MG tablet Take 1 tablet (500 mg total) by mouth every 6 (six) hours as needed. (Patient taking differently: Take 500 mg by mouth every 6 (six) hours as needed for mild pain, moderate pain or headache. )  . aspirin EC 81 MG tablet Take 1 tablet (81 mg total) by mouth daily.  . benazepril (LOTENSIN) 20 MG tablet Take 1 tablet (20 mg total) by mouth daily.  . Blood Glucose Monitoring Suppl (ACCU-CHEK AVIVA PLUS) w/Device KIT 1 each by Does not apply route daily. Use to monitor glucose levels daily; E11.8  . bromocriptine (PARLODEL) 2.5 MG tablet Take 1 tablet by mouth once daily  . carvedilol (COREG) 3.125 MG tablet Take 1 tablet (3.125 mg total) by mouth 2 (two) times daily with a meal.  . Cyanocobalamin (VITAMIN B-12 PO) Take 1 tablet by mouth daily.  Marland Kitchen ezetimibe (ZETIA) 10 MG tablet Take 1 tablet by mouth once daily (Patient taking differently: Take 10 mg by mouth daily. )  . ferrous sulfate 325 (65 FE) MG tablet Take 325 mg by mouth daily with breakfast.  . glucose blood (ACCU-CHEK AVIVA PLUS) test strip 1 each by Other route daily. Use to monitor glucose levels daily; E11.8  . metFORMIN (GLUCOPHAGE) 500 MG tablet Take 4 tablets by mouth daily  . Multiple Vitamin (MULTIVITAMIN) tablet Take 1 tablet by mouth daily.    . nitroGLYCERIN (NITROSTAT) 0.4 MG SL tablet Place 1 tablet (0.4 mg total) under the tongue every 5 (five) minutes as needed for chest pain.  . Omega-3 Fatty Acids (FISH OIL) 1000 MG CAPS Take 1,000 mg by mouth daily.   . ranolazine (RANEXA) 500 MG 12 hr tablet Take 1 tablet (500 mg total) by mouth 2 (two) times daily.  . repaglinide (PRANDIN) 0.5 MG tablet Take 1 tablet (0.5 mg total) by mouth daily with supper.  . rosuvastatin (CRESTOR) 40 MG tablet Take 1 tablet (40 mg total) by mouth daily.   . sildenafil (VIAGRA) 100 MG tablet Take 1 tablet (100 mg total) by mouth daily as needed for erectile dysfunction.     Allergies: No Known Allergies  Social History: The patient  reports that he quit smoking about 6 years ago. His smoking use included cigarettes. He has a 5.00 pack-year smoking history. He has never used smokeless tobacco. He reports current alcohol use of about 3.0 standard drinks of alcohol per week. He reports that he does not use drugs.   Family History: The patient's family history includes Anuerysm in his brother; Diabetes in his maternal grandmother and sister; Hyperlipidemia in his sister and sister; Hypertension in his sister; Kidney disease in his sister.   Review of Systems: Please see  the history of present illness.   All other systems are reviewed and negative.   Physical Exam: VS:  BP 130/88   Pulse 76   Ht 5' 7" (1.702 m)   Wt 188 lb (85.3 kg)   SpO2 96%   BMI 29.44 kg/m  .  BMI Body mass index is 29.44 kg/m.  Wt Readings from Last 3 Encounters:  10/26/19 188 lb (85.3 kg)  10/06/19 188 lb 3.2 oz (85.4 kg)  06/07/19 193 lb 3.2 oz (87.6 kg)    General: Pleasant. Alert and in no acute distress. Looks younger than his stated age.   HEENT: Normal.  Neck: Supple, no JVD, carotid bruits, or masses noted.  Cardiac: Regular rate and rhythm. No murmurs, rubs, or gallops. No edema.  Respiratory:  Lungs are clear to auscultation bilaterally with normal work of breathing.  GI: Soft and nontender.  MS: No deformity or atrophy. Gait and ROM intact.  Skin: Warm and dry. Color is normal.  Neuro:  Strength and sensation are intact and no gross focal deficits noted.  Psych: Alert, appropriate and with normal affect.   LABORATORY DATA:  EKG:  EKG is ordered today. This demonstrates NSR with borderline 1st degree AV block with LVH - unchanged.  Lab Results  Component Value Date   WBC 3.1 (L) 04/11/2019   HGB 12.1 (L) 04/11/2019   HCT 35.3 (L)  04/11/2019   PLT 130.0 (L) 04/11/2019   GLUCOSE 180 (H) 04/11/2019   CHOL 132 04/11/2019   TRIG 144.0 04/11/2019   HDL 55.30 04/11/2019   LDLDIRECT 204.8 09/05/2013   LDLCALC 48 04/11/2019   ALT 54 (H) 04/11/2019   AST 56 (H) 04/11/2019   NA 138 04/11/2019   K 4.2 04/11/2019   CL 106 04/11/2019   CREATININE 1.09 04/11/2019   BUN 15 04/11/2019   CO2 24 04/11/2019   TSH 0.63 04/11/2019   PSA 0.21 04/11/2019   INR 0.9 12/12/2018   HGBA1C 6.5 (A) 10/06/2019   MICROALBUR 1.2 08/22/2014     BNP (last 3 results) No results for input(s): BNP in the last 8760 hours.  ProBNP (last 3 results) No results for input(s): PROBNP in the last 8760 hours.   Other Studies Reviewed Today:  LHC 12/14/18:  Ost LM to Dist LM lesion is 65% stenosed. FFR of this lesion into the distal circumflex was negative, 0.95.  Prox LAD lesion is 85% stenosed. LIMA to LAD is patent.  1st Diag lesion is 100% stenosed. SVG to diagonal is occluded.  Ost 2nd Mrg to 2nd Mrg lesion is 65% stenosed. Jump graft from OM1 to OM2 is occluded.  Ost 1st Mrg lesion is 75% stenosed. SVG to OM1 portion is patent.  Mid RCA lesion is 100% stenosed. SVG to PDA is patent. Ostial PDA disease is unchanged from prior.  Ost RPDA to RPDA lesion is 95% stenosed.  The left ventricular ejection fraction is 35-45% by visual estimate.  There is mild to moderate left ventricular systolic dysfunction.  LV end diastolic pressure is normal.  Continue medical therapy for small vessel disease and for LV dysfunction.   Echo 12/13/18: IMPRESSIONS  1. The left ventricle has mildly reduced systolic function, with an ejection fraction of 45-50%. The cavity size was normal. Left ventricular diastolic Doppler parameters are indeterminate. There is abnormal septal motion consistent with post-operative  status. Left ventricular diffuse hypokinesis. 2. The right ventricle has normal systolic function. The cavity was normal. There is  no increase in right  ventricular wall thickness. 3. Left atrial size was mildly dilated. 4. There is mild mitral annular calcification present. 5. The aortic valve is tricuspid Mild thickening of the aortic valve Mild calcification of the aortic valve.   ASSESSMENT & PLAN:    1.  CAD with prior CABG and has had graft failure and small vessel disease noted by last cath noted from 11/2018 - managed medically - now on Ranexa - no exertional symptoms. Would favor continued therapy and CV risk factor modification.    2. Chronic systolic and diastolic CHF - EF of 45 to 50% by last echo - NYHA I - weight is down. Restricting his salt. Not short of breath. On ACE and beta blocker therapy.   3. HTN - BP looks great. No changes made today.   4. HLD - on statin - lab today.   5. Tobacco abuse - denies smoking.   6. DM - on Jardiance - per PCP - A1C looks great.   7. Prior elevation of LFTs - needs rechecking today.   8. Mild thrombocytopenia - needs rechecking today.   9. ED - on Viagra - not on long acting nitrate therapy.   10. COVID-19 Education: The signs and symptoms of COVID-19 were discussed with the patient and how to seek care for testing (follow up with PCP or arrange E-visit).  The importance of social distancing, staying at home, hand hygiene and wearing a mask when out in public were discussed today. On wait list for COVID vaccine.   Current medicines are reviewed with the patient today.  The patient does not have concerns regarding medicines other than what has been noted above.  The following changes have been made:  See above.  Labs/ tests ordered today include:    Orders Placed This Encounter  Procedures  . Basic metabolic panel  . CBC  . Hepatic function panel  . Lipid panel  . EKG 12-Lead     Disposition:   FU with Dr. Smith in 3 to 4 months.   Patient is agreeable to this plan and will call if any problems develop in the interim.   Signed: LORI GERHARDT,  NP  10/26/2019 8:58 AM   [facility] [address] [place], [place]  [address] Phone: [phone] Fax: [phone]        

## 2019-10-26 ENCOUNTER — Ambulatory Visit (INDEPENDENT_AMBULATORY_CARE_PROVIDER_SITE_OTHER): Payer: Medicare HMO | Admitting: Nurse Practitioner

## 2019-10-26 ENCOUNTER — Other Ambulatory Visit: Payer: Self-pay

## 2019-10-26 ENCOUNTER — Encounter: Payer: Self-pay | Admitting: Nurse Practitioner

## 2019-10-26 VITALS — BP 130/88 | HR 76 | Ht 67.0 in | Wt 188.0 lb

## 2019-10-26 DIAGNOSIS — I2510 Atherosclerotic heart disease of native coronary artery without angina pectoris: Secondary | ICD-10-CM

## 2019-10-26 DIAGNOSIS — I10 Essential (primary) hypertension: Secondary | ICD-10-CM | POA: Diagnosis not present

## 2019-10-26 DIAGNOSIS — Z7189 Other specified counseling: Secondary | ICD-10-CM | POA: Diagnosis not present

## 2019-10-26 DIAGNOSIS — E7800 Pure hypercholesterolemia, unspecified: Secondary | ICD-10-CM

## 2019-10-26 DIAGNOSIS — Z951 Presence of aortocoronary bypass graft: Secondary | ICD-10-CM

## 2019-10-26 DIAGNOSIS — I5042 Chronic combined systolic (congestive) and diastolic (congestive) heart failure: Secondary | ICD-10-CM

## 2019-10-26 LAB — BASIC METABOLIC PANEL
BUN/Creatinine Ratio: 13 (ref 10–24)
BUN: 14 mg/dL (ref 8–27)
CO2: 21 mmol/L (ref 20–29)
Calcium: 9.4 mg/dL (ref 8.6–10.2)
Chloride: 105 mmol/L (ref 96–106)
Creatinine, Ser: 1.1 mg/dL (ref 0.76–1.27)
GFR calc Af Amer: 80 mL/min/{1.73_m2} (ref >59)
GFR calc non Af Amer: 70 mL/min/{1.73_m2} (ref >59)
Glucose: 133 mg/dL — ABNORMAL HIGH (ref 65–99)
Potassium: 4.1 mmol/L (ref 3.5–5.2)
Sodium: 141 mmol/L (ref 134–144)

## 2019-10-26 LAB — CBC
Hematocrit: 37.6 % (ref 37.5–51.0)
Hemoglobin: 12.8 g/dL — ABNORMAL LOW (ref 13.0–17.7)
MCH: 31.1 pg (ref 26.6–33.0)
MCHC: 34 g/dL (ref 31.5–35.7)
MCV: 92 fL (ref 79–97)
Platelets: 140 10*3/uL — ABNORMAL LOW (ref 150–450)
RBC: 4.11 x10E6/uL — ABNORMAL LOW (ref 4.14–5.80)
RDW: 12.4 % (ref 11.6–15.4)
WBC: 4.2 10*3/uL (ref 3.4–10.8)

## 2019-10-26 LAB — LIPID PANEL
Chol/HDL Ratio: 1.9 ratio (ref 0.0–5.0)
Cholesterol, Total: 142 mg/dL (ref 100–199)
HDL: 74 mg/dL (ref >39)
LDL Chol Calc (NIH): 43 mg/dL (ref 0–99)
Triglycerides: 151 mg/dL — ABNORMAL HIGH (ref 0–149)
VLDL Cholesterol Cal: 25 mg/dL (ref 5–40)

## 2019-10-26 LAB — HEPATIC FUNCTION PANEL
ALT: 34 IU/L (ref 0–44)
AST: 34 IU/L (ref 0–40)
Albumin: 4.4 g/dL (ref 3.8–4.8)
Alkaline Phosphatase: 37 IU/L — ABNORMAL LOW (ref 39–117)
Bilirubin Total: 0.4 mg/dL (ref 0.0–1.2)
Bilirubin, Direct: 0.15 mg/dL (ref 0.00–0.40)
Total Protein: 7.6 g/dL (ref 6.0–8.5)

## 2019-10-26 NOTE — Patient Instructions (Addendum)
After Visit Summary:  We will be checking the following labs today - BMET, CBC, HPF, and lipids   Medication Instructions:    Continue with your current medicines.    If you need a refill on your cardiac medications before your next appointment, please call your pharmacy.     Testing/Procedures To Be Arranged:  N/A  Follow-Up:   See Dr. [name] in 3 to 4 months    At [facility], you and your health needs are our priority.  As part of our continuing mission to provide you with exceptional heart care, we have created designated Provider Care Teams.  These Care Teams include your primary Cardiologist (physician) and Advanced Practice Providers (APPs -  Physician Assistants and Nurse Practitioners) who all work together to provide you with the care you need, when you need it.  Special Instructions:  . Stay safe, stay home, wash your hands for at least 20 seconds and wear a mask when out in public.  . It was good to talk with you today.  Marland Kitchen Keep up the walking.    Call the [place] office at [phone] if you have any questions, problems or concerns.

## 2019-10-31 ENCOUNTER — Other Ambulatory Visit: Payer: Self-pay | Admitting: Endocrinology

## 2019-11-02 ENCOUNTER — Other Ambulatory Visit: Payer: Self-pay | Admitting: Family Medicine

## 2019-11-11 ENCOUNTER — Ambulatory Visit: Payer: Medicare HMO | Attending: Internal Medicine

## 2019-11-11 DIAGNOSIS — Z23 Encounter for immunization: Secondary | ICD-10-CM

## 2019-11-11 NOTE — Progress Notes (Signed)
   Covid-19 Vaccination Clinic  Name:  Jerome Vaughn    MRN: [id number] DOB: Sep 04, 1951  11/11/2019  Jerome Vaughn was observed post Covid-19 immunization for 15 minutes without incidence. He was provided with Vaccine Information Sheet and instruction to access the V-Safe system.   Jerome Vaughn was instructed to call 911 with any severe reactions post vaccine: Marland Kitchen Difficulty breathing  . Swelling of your face and throat  . A fast heartbeat  . A bad rash all over your body  . Dizziness and weakness    Immunizations Administered    Name Date Dose VIS Date Route   Pfizer COVID-19 Vaccine 11/11/2019  8:30 AM 0.3 mL 09/09/2019 Intramuscular   Manufacturer: [place]   Lot: [id number]   [employer]: [id number]

## 2019-11-19 ENCOUNTER — Other Ambulatory Visit: Payer: Self-pay | Admitting: Interventional Cardiology

## 2019-12-05 ENCOUNTER — Ambulatory Visit: Payer: Medicare HMO | Attending: Internal Medicine

## 2019-12-05 DIAGNOSIS — Z23 Encounter for immunization: Secondary | ICD-10-CM

## 2019-12-05 NOTE — Progress Notes (Signed)
   Covid-19 Vaccination Clinic  Name:  Jerome Vaughn    MRN: [id number] DOB: March 26, 1951  12/05/2019  [name] was observed post Covid-19 immunization for 15 minutes without incident. He was provided with Vaccine Information Sheet and instruction to access the V-Safe system.   [name] was instructed to call 911 with any severe reactions post vaccine: Marland Kitchen Difficulty breathing  . Swelling of face and throat  . A fast heartbeat  . A bad rash all over body  . Dizziness and weakness   Immunizations Administered    Name Date Dose VIS Date Route   Pfizer COVID-19 Vaccine 12/05/2019  8:25 AM 0.3 mL 09/09/2019 Intramuscular   Manufacturer: [place]   Lot: [id number]   [employer]: [id number]

## 2019-12-06 ENCOUNTER — Other Ambulatory Visit: Payer: Self-pay | Admitting: Family Medicine

## 2019-12-20 ENCOUNTER — Other Ambulatory Visit: Payer: Self-pay | Admitting: Interventional Cardiology

## 2019-12-20 MED ORDER — EZETIMIBE 10 MG PO TABS: 10.0000 mg | ORAL_TABLET | Freq: Every day | ORAL | 2 refills | Status: DC

## 2019-12-20 NOTE — Telephone Encounter (Signed)
Pt's medication was sent to pt's pharmacy as requested. Confirmation received.  °

## 2020-01-03 ENCOUNTER — Ambulatory Visit (INDEPENDENT_AMBULATORY_CARE_PROVIDER_SITE_OTHER): Payer: Medicare HMO | Admitting: Family Medicine

## 2020-01-03 ENCOUNTER — Other Ambulatory Visit: Payer: Self-pay

## 2020-01-03 ENCOUNTER — Encounter: Payer: Self-pay | Admitting: Family Medicine

## 2020-01-03 VITALS — BP 130/70 | HR 84 | Temp 97.9°F | Wt 184.4 lb

## 2020-01-03 DIAGNOSIS — K625 Hemorrhage of anus and rectum: Secondary | ICD-10-CM | POA: Diagnosis not present

## 2020-01-03 NOTE — Progress Notes (Signed)
   Subjective:    Patient ID: Jerome Vaughn, male    DOB: 1951-01-24, 69 y.o.   MRN: [id number]  HPI Here for 2 months of intermittent bright red blood per rectum during bowel movements. His stools are soft and easy to pass. There is no rectal pain or abdominal pain. His last colonoscopy per dr. [name] in 2011 showed a few benign polyps.    Review of Systems  Constitutional: Negative.   Respiratory: Negative.   Cardiovascular: Negative.   Gastrointestinal: Positive for blood in stool. Negative for abdominal distention, abdominal pain, anal bleeding, constipation, diarrhea, nausea, rectal pain and vomiting.  Genitourinary: Negative.        Objective:   Physical Exam Constitutional:      Appearance: Normal appearance. He is not ill-appearing.  Cardiovascular:     Rate and Rhythm: Normal rate and regular rhythm.     Pulses: Normal pulses.     Heart sounds: Normal heart sounds.  Pulmonary:     Effort: Pulmonary effort is normal.     Breath sounds: Normal breath sounds.  Abdominal:     General: Abdomen is flat. Bowel sounds are normal. There is no distension.     Palpations: Abdomen is soft. There is no mass.     Tenderness: There is no abdominal tenderness. There is no guarding or rebound.     Hernia: No hernia is present.  Genitourinary:    Comments: There are a few non-inflamed external hemorrhoids but these are not the source of the bleeding. Otherwise rectal exam shows no tenderness or masses  Neurological:     Mental Status: He is alert.           Assessment & Plan:  Rectal bleeding, possibly from internal hemorrhoids. We will refer him to Dr. [name], and I expect he will decide to go ahead with another colonoscopy.  Alysia Penna, MD

## 2020-01-05 ENCOUNTER — Telehealth: Payer: Self-pay | Admitting: Family Medicine

## 2020-01-05 DIAGNOSIS — K625 Hemorrhage of anus and rectum: Secondary | ICD-10-CM

## 2020-01-05 NOTE — Telephone Encounter (Signed)
[facility] call and stated that they cannot see this pt because his acct is inactive the patient need to call [facility]/mm

## 2020-01-11 ENCOUNTER — Encounter: Payer: Self-pay | Admitting: Nurse Practitioner

## 2020-01-11 NOTE — Telephone Encounter (Addendum)
Pt stated he would like his GI referral to go to [facility]. Pt also said he would like it sent ASAP  Pt can be reached at [phone] or my-chart

## 2020-01-11 NOTE — Telephone Encounter (Signed)
I changed the referral to [facility]

## 2020-01-11 NOTE — Addendum Note (Signed)
Addended by: Alysia Penna A on: 01/11/2020 12:14 PM   Modules accepted: Orders

## 2020-01-24 ENCOUNTER — Other Ambulatory Visit (INDEPENDENT_AMBULATORY_CARE_PROVIDER_SITE_OTHER): Payer: Medicare HMO

## 2020-01-24 ENCOUNTER — Encounter: Payer: Self-pay | Admitting: Nurse Practitioner

## 2020-01-24 ENCOUNTER — Ambulatory Visit (INDEPENDENT_AMBULATORY_CARE_PROVIDER_SITE_OTHER): Payer: Medicare HMO | Admitting: Nurse Practitioner

## 2020-01-24 VITALS — BP 104/70 | HR 84 | Temp 98.9°F | Ht 65.5 in | Wt 188.1 lb

## 2020-01-24 DIAGNOSIS — D509 Iron deficiency anemia, unspecified: Secondary | ICD-10-CM | POA: Diagnosis not present

## 2020-01-24 DIAGNOSIS — K30 Functional dyspepsia: Secondary | ICD-10-CM

## 2020-01-24 DIAGNOSIS — K625 Hemorrhage of anus and rectum: Secondary | ICD-10-CM | POA: Diagnosis not present

## 2020-01-24 LAB — IBC + FERRITIN
Ferritin: 12.4 ng/mL — ABNORMAL LOW (ref 22.0–322.0)
Iron: 99 ug/dL (ref 42–165)
Saturation Ratios: 20.1 % (ref 20.0–50.0)
Transferrin: 351 mg/dL (ref 212.0–360.0)

## 2020-01-24 LAB — CBC
HCT: 36.3 % — ABNORMAL LOW (ref 39.0–52.0)
Hemoglobin: 12.3 g/dL — ABNORMAL LOW (ref 13.0–17.0)
MCHC: 33.9 g/dL (ref 30.0–36.0)
MCV: 89.9 fl (ref 78.0–100.0)
Platelets: 130 10*3/uL — ABNORMAL LOW (ref 150.0–400.0)
RBC: 4.03 Mil/uL — ABNORMAL LOW (ref 4.22–5.81)
RDW: 13.4 % (ref 11.5–15.5)
WBC: 3.2 10*3/uL — ABNORMAL LOW (ref 4.0–10.5)

## 2020-01-24 NOTE — Patient Instructions (Addendum)
If you are age 67 or older, your body mass index should be between 23-30. Your Body mass index is 30.83 kg/m. If this is out of the aforementioned range listed, please consider follow up with your Primary Care Provider.  If you are age 71 or younger, your body mass index should be between 19-25. Your Body mass index is 30.83 kg/m. If this is out of the aformentioned range listed, please consider follow up with your Primary Care Provider.   Your provider has requested that you go to the basement level for lab work before leaving today. Press "B" on the elevator. The lab is located at the first door on the left as you exit the elevator.    Please purchase the following medications over the counter and take as directed:  Apply a small amount of Desitin inside the anal opening and to the external anal area three times a Schear as needed for anal or hemorrhoidal irritation/bleeding for 7 days and then as needed.  Please use Colace 100mg  daily  Schedule and EGD/Colonoscopy after you see your cardiologist 01/26/2020    Due to recent changes in healthcare laws, you may see the results of your imaging and laboratory studies on MyChart before your provider has had a chance to review them.  We understand that in some cases there may be results that are confusing or concerning to you. Not all laboratory results come back in the same time frame and the provider may be waiting for multiple results in order to interpret others.  Please give [place] 48 hours in order for your provider to thoroughly review all the results before contacting the office for clarification of your results.

## 2020-01-24 NOTE — Progress Notes (Signed)
01/24/2020 Jerome Vaughn [id number] [birth year]   CHIEF COMPLAINT: indigestion, rectal bleeding   HISTORY OF PRESENT ILLNESS: Jerome Vaughn is a 69 year old male with a past medical history of hypertension, hyperlipidemia, coronary artery disease status post CABG x 5 vessels  in 2017 and diabetes mellitus type 2.  He presents today as referred by his PCP Dr. Alysia Penna for further evaluation regarding indigestion and rectal bleeding.  He reports having mid esophageal burning with a tightness sensation which started approximately 4 months ago.  He describes having bad acid reflux with a burning discomfort which spreads across his chest which typically occurs after eating. Spicy foods will trigger the symptoms.  He denies having any dysphagia.  No upper or lower abdominal pain.  He started taking Pepcid 10 mg 1 p.o. daily 1-1/2 weeks ago with some improvement. He is passing 1 or 2 normal brown formed stools daily.  He occasionally strains to pass a bowel movement.  He reports seeing bright red blood on the tissue and in the toilet water which initially started 02/2019.  Since that time, his rectal bleeding has increased.  He is now seeing bright red blood with his bowel movements 2 to 3 days weekly for the past 2 months.  He describes hearing 2 to 3 drops of blood as it hits the toilet water.  No associated anal or rectal pain.  Today,  he passed a normal bowel movement with a small amount of bright red blood on the toilet tissue and in the toilet water.  He passed a second bowel movement 15 to 20 minutes later without any noticeable blood.  He underwent a screening colonoscopy by Dr. [name] 12/07/2009, two tubular adenomatous removed from the a sending colon and cecum. He takes aspirin 81 mg daily.  No other NSAID use.  He is taking ferrous sulfate 325 mg once daily for the past 2 years to prevent a drop in his hemoglobin as he donates blood 2-3 times yearly.  He last donated a unit of blood on 10/04/2019.   Labs 10/26/2019 showed a Hg level of 12.8.  He was scheduled to donate a unit of blood early April 2021 but his hemoglobin was "too low".  No family history of colorectal cancer.  He is scheduled to see his cardiologist Dr. Daneen Schick on Thursday, 01/26/2020.  CBC Latest Ref Rng & Units 10/26/2019 04/11/2019 12/12/2018  WBC 3.4 - 10.8 x10E3/uL 4.2 3.1(L) 6.5  Hemoglobin 13.0 - 17.7 g/dL 12.8(L) 12.1(L) 13.5  Hematocrit 37.5 - 51.0 % 37.6 35.3(L) 41.6  Platelets 150 - 450 x10E3/uL 140(L) 130.0(L) 125(L)   CMP Latest Ref Rng & Units 10/26/2019 04/11/2019 12/14/2018  Glucose 65 - 99 mg/dL 133(H) 180(H) 156(H)  BUN 8 - 27 mg/dL _0 Creatinine 0.76 - 1.27 mg/dL 1.10 1.09 0.91  Sodium 134 - 144 mmol/L 141 138 138  Potassium 3.5 - 5.2 mmol/L 4.1 4.2 3.8  Chloride 96 - 106 mmol/L 105 106 110  CO2 20 - 29 mmol/L _1 Calcium 8.6 - 10.2 mg/dL 9.4 8.6 8.8(L)  Total Protein 6.0 - 8.5 g/dL 7.6 6.9 -  Total Bilirubin 0.0 - 1.2 mg/dL 0.4 0.6 -  Alkaline Phos 39 - 117 IU/L 37(L) 29(L) -  AST 0 - 40 IU/L 34 56(H) -  ALT 0 - 44 IU/L 34 54(H) -    Past Medical History:  Diagnosis Date  . Carpal tunnel syndrome    both hands  .  Coronary artery disease    a. Cath 11/21/2015: Multivessel CAD --> CABG recommended. b. CABG on 11/22/2015:  LIMA-LAD, SVG-Diag, SVG-OM1 and distal Cx, and SVG-PDA.  . Diabetes mellitus   . History of echocardiogram    a. Echo 4/17: Moderate LVH, EF 50-55%, mild LAE, no pericardial effusion  . Hyperlipidemia   . Hypertension    Past Surgical History:  Procedure Laterality Date  . CARDIAC CATHETERIZATION N/A 11/21/2015   Procedure: Left Heart Cath and Coronary Angiography;  Surgeon: [name], MD;  Location: [facility];  Service: Cardiovascular;  Laterality: N/A;  . colonsocopy  12/07/2009   per Dr. [name], benign polyps, repeat in 10 yrs   . CORONARY ARTERY BYPASS GRAFT N/A 11/22/2015   Procedure: CORONARY ARTERY BYPASS GRAFTING (CABG) times five using the  left internal mammary, right greater saphenous vein EVH, and left thigh greater saphenous vein EVH;  Surgeon: [name], MD;  Location: [facility];  Service: Open Heart Surgery;  Laterality: N/A;  . frozen shoulder release    . INTRAVASCULAR PRESSURE WIRE/FFR STUDY N/A 12/14/2018   Procedure: INTRAVASCULAR PRESSURE WIRE/FFR STUDY;  Surgeon: Jettie Booze, MD;  Location: [place] [facility];  Service: Cardiovascular;  Laterality: N/A;  . LEFT HEART CATH AND CORS/GRAFTS ANGIOGRAPHY N/A 10/27/2017   Procedure: LEFT HEART CATH AND CORS/GRAFTS ANGIOGRAPHY;  Surgeon: [name], MD;  Location: [facility];  Service: Cardiovascular;  Laterality: N/A;  . LEFT HEART CATH AND CORS/GRAFTS ANGIOGRAPHY N/A 12/14/2018   Procedure: LEFT HEART CATH AND CORS/GRAFTS ANGIOGRAPHY;  Surgeon: Jettie Booze, MD;  Location: [facility];  Service: Cardiovascular;  Laterality: N/A;  . TEE WITHOUT CARDIOVERSION N/A 11/22/2015   Procedure: TRANSESOPHAGEAL ECHOCARDIOGRAM (TEE);  Surgeon: [name], MD;  Location: [place];  Service: Open Heart Surgery;  Laterality: N/A;  . WRIST GANGLION EXCISION     Social History: He is a Patent attorney. Married. He has 3 sons and 2 girls. He previously smoked 1/2 pack of cigarettes weekly, cigars x7 years.  He quit smoking 5 years ago.  He drinks 1 beer every other [place]azz[place].  He occasionally has a mixed cocktail twice weekly.  No drug use.   Family History: Sister with hypertension and kidney disease.  Brother with aneurysm.  Terminal grandmother and sister with diabetes.  No Known Allergies   Outpatient Encounter Medications as of 01/24/2020  Medication Sig  . Accu-Chek Softclix Lancets lancets 1 each by Other route daily. Use to monitor glucose levels daily; E11.8  . acetaminophen (TYLENOL) 500 MG tablet Take 1 tablet (500 mg total) by mouth every 6 (six) hours as needed. (Patient taking differently: Take 500 mg by mouth every 6 (six) hours as needed for  mild pain, moderate pain or headache. )  . aspirin EC 81 MG tablet Take 1 tablet (81 mg total) by mouth daily.  . benazepril (LOTENSIN) 20 MG tablet Take 1 tablet (20 mg total) by mouth daily.  . Blood Glucose Monitoring Suppl (ACCU-CHEK AVIVA PLUS) w/Device KIT 1 each by Does not apply route daily. Use to monitor glucose levels daily; E11.8  . bromocriptine (PARLODEL) 2.5 MG tablet Take 1 tablet by mouth once daily  . carvedilol (COREG) 3.125 MG tablet Take 1 tablet (3.125 mg total) by mouth 2 (two) times daily with a meal.  . Cyanocobalamin (VITAMIN B-12 PO) Take 1 tablet by mouth daily.  Marland Kitchen ezetimibe (ZETIA) 10 MG tablet Take 1 tablet (10 mg total)  by mouth daily.  . ferrous sulfate 325 (65 FE) MG tablet Take 325 mg by mouth daily with breakfast.  . glucose blood (ACCU-CHEK AVIVA PLUS) test strip 1 each by Other route daily. Use to monitor glucose levels daily; E11.8  . metFORMIN (GLUCOPHAGE) 500 MG tablet Take 4 tablets by mouth daily  . Multiple Vitamin (MULTIVITAMIN) tablet Take 1 tablet by mouth daily.    . nitroGLYCERIN (NITROSTAT) 0.4 MG SL tablet Place 1 tablet (0.4 mg total) under the tongue every 5 (five) minutes as needed for chest pain.  . Omega-3 Fatty Acids (FISH OIL) 1000 MG CAPS Take 1,000 mg by mouth daily.   . ranolazine (RANEXA) 500 MG 12 hr tablet Take 1 tablet (500 mg total) by mouth 2 (two) times daily.  . repaglinide (PRANDIN) 0.5 MG tablet Take 1 tablet (0.5 mg total) by mouth daily with supper.  . rosuvastatin (CRESTOR) 40 MG tablet Take 1 tablet (40 mg total) by mouth daily.  . sildenafil (VIAGRA) 100 MG tablet TAKE ONE TABLET BY MOUTH DAILY AS NEEDED FOR ERECTILE DYSFUNCTION   No facility-administered encounter medications on file as of 01/24/2020.     REVIEW OF SYSTEMS: All other systems reviewed and negative except where noted in the History of Present Illness.   PHYSICAL EXAM: BP 104/70 (BP Location: Left Arm, Patient Position: Sitting, Cuff Size: Normal)    Pulse 84   Temp 98.9 F (37.2 C)   Ht 5' 5.5" (1.664 m) Comment: height measured without shoes  Wt 188 lb 2 oz (85.3 kg)   BMI 30.83 kg/m   General: Well developed 69 year old male in no acute distress. Head: Normocephalic and atraumatic. Eyes:  Sclerae non-icteric, conjunctive pink. Ears: Normal auditory acuity. Mouth: Upper dentures.  No ulcers or lesions.  Neck: Supple, no lymphadenopathy or thyromegaly.  Lungs: Clear bilaterally to auscultation without wheezes, crackles or rhonchi. Heart: Regular rate and rhythm. No murmur, rub or gallop appreciated.  Abdomen: Soft, nontender, non distended. No masses. No hepatosplenomegaly. Normoactive bowel sounds x 4 quadrants.  Rectal: No fissures.  Anal hemorrhoids with moderate erythema, posterior hemorrhoid at the anal opening somewhat friable without active bleeding.  Moderate internal hemorrhoids palpated.  No blood or stool in the rectal vault.  No mass.  Olivia Mackie CMA present during exam. Musculoskeletal: Symmetrical with no gross deformities. Skin: Warm and dry. No rash or lesions on visible extremities. Extremities: No edema. Neurological: Alert oriented x 4, no focal deficits.  Psychological:  Alert and cooperative. Normal mood and affect.  ASSESSMENT AND PLAN:  28.  69 year old male with indigestion/heartburn -EGD to be scheduled after he sees his cardiologist on 01/26/2020.  EGD benefits and risks discussed including risk with sedation, risk of bleeding, perforation and infection. -Continue Famotidine, may increase to 20 mg once to twice daily. -Avoid spicy foods  2.  Rectal bleeding, most likely hemorrhoidal component.  -Colonoscopy to be scheduled after he sees cardiologist on 01/26/2020. Colonoscopy benefits and risks discussed including risk with sedation, risk of bleeding, perforation and infection  -Colace 100 mg daily -Anusol HC suppositories not covered by his insurance.  Desitin apply a small amount inside the anal area into  the external anal area 2-3 times daily for 7 days and as needed. -Patient to call our office if his rectal bleeding worsens  3. History of tubular adenomatous polyps in 2011 per colonoscopy done per Dr. [name], past due for follow up colonoscopy.  -See plan in # 2  3.  History of iron deficiency  anemia previously assessed to be related to blood donation, rectal bleeding a contributing factor at this time as well.  -CBC, iron, iron saturation, TIBC and ferritin -No further blood donations for now discussed with the patient -EGD and colonoscopy  4.  Coronary artery disease status post 5 vessel CABG in 2017.  Patient has active heartburn with the chest tightness component.  He is scheduled to see his cardiologist Dr. Daneen Schick on 01/26/2020 to rule out cardiac etiology guarding his chest tightness. -EGD and colonoscopy be scheduled after he sees his cardiologist on 01/26/2020.  If further cardiac evaluation is warranted, an EGD and colonoscopy would be scheduled once he completes his cardiac evaluation.  The patient will call me on Friday 4/30 with further update.  5.  Diabetes mellitus type 2, stable    CC:  [name], MD

## 2020-01-24 NOTE — Progress Notes (Signed)
Reviewed and agree with management plans. ? ?Luigi Stuckey L. Abdullahi Vallone, MD, MPH  ?

## 2020-01-25 LAB — IRON, TOTAL/TOTAL IRON BINDING CAP
%SAT: 25 % (calc) (ref 20–48)
Iron: 104 ug/dL (ref 50–180)
TIBC: 418 mcg/dL (calc) (ref 250–425)

## 2020-01-25 NOTE — Progress Notes (Signed)
Cardiology Office Note:    Date:  01/26/2020   ID:  Jerome Vaughn, DOB [birth year], MRN [id number]  PCP:  Jerome Morale, MD  Cardiologist:  Jerome Grooms, MD   Referring MD: Jerome Morale, MD   Chief Complaint  Patient presents with  . Coronary Artery Disease  . Congestive Heart Failure    History of Present Illness:    Jerome Vaughn is a 69 y.o. 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 He was referred to [facility]. He was seen by Dr. Prescott Vaughn and underwent CABG with LIMA-LAD, SVG-diagonal, SVG-OM1/distal LCx, SVG-PDA on 11/22/15.After bypass surgery he was readmitted to the hospital after developing chest discomfort. Recently an LV assessment by echo demonstrated EF 30-35%.  Starting approximately 6 months after surgery he has begun complaining of episodes of chest pain.  No limitations physically.  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  No exertion related chest discomfort or dyspnea.  He has an occasional palpitation.  He has not needed to use sublingual nitroglycerin.  He may not be getting 6 hours of sleep per Jerome Vaughn.  He is compliant with his medications.  Past Medical History:  Diagnosis Date  . Carpal tunnel syndrome    both hands  . Colon polyps   . Coronary artery disease    a. Cath 11/21/2015: Multivessel CAD --> CABG recommended. b. CABG on 11/22/2015:  LIMA-LAD, SVG-Diag, SVG-OM1 and distal Cx, and SVG-PDA.  . Diabetes mellitus   . GERD (gastroesophageal reflux disease)   . History of echocardiogram    a. Echo 4/17: Moderate LVH, EF 50-55%, mild LAE, no pericardial effusion  . Hyperlipidemia   . Hypertension   . Pneumonia     Past Surgical History:  Procedure Laterality Date  . CARDIAC CATHETERIZATION N/A 11/21/2015   Procedure: Left Heart Cath and Coronary Angiography;  Surgeon: [name], MD;  Location: [facility];  Service: Cardiovascular;  Laterality: N/A;  .  colonsocopy  12/07/2009   per Dr. [name], benign polyps, repeat in 10 yrs   . CORONARY ARTERY BYPASS GRAFT N/A 11/22/2015   Procedure: CORONARY ARTERY BYPASS GRAFTING (CABG) times five using the left internal mammary, right greater saphenous vein EVH, and left thigh greater saphenous vein EVH;  Surgeon: [name], MD;  Location: [facility];  Service: Open Heart Surgery;  Laterality: N/A;  . frozen shoulder release Right   . INTRAVASCULAR PRESSURE WIRE/FFR STUDY N/A 12/14/2018   Procedure: INTRAVASCULAR PRESSURE WIRE/FFR STUDY;  Surgeon: Jettie Booze, MD;  Location: [facility];  Service: Cardiovascular;  Laterality: N/A;  . LEFT HEART CATH AND CORS/GRAFTS ANGIOGRAPHY N/A 10/27/2017   Procedure: LEFT HEART CATH AND CORS/GRAFTS ANGIOGRAPHY;  Surgeon: [name], MD;  Location: [facility];  Service: Cardiovascular;  Laterality: N/A;  . LEFT HEART CATH AND CORS/GRAFTS ANGIOGRAPHY N/A 12/14/2018   Procedure: LEFT HEART CATH AND CORS/GRAFTS ANGIOGRAPHY;  Surgeon: Jettie Booze, MD;  Location: [facility];  Service: Cardiovascular;  Laterality: N/A;  . TEE WITHOUT CARDIOVERSION N/A 11/22/2015   Procedure: TRANSESOPHAGEAL ECHOCARDIOGRAM (TEE);  Surgeon: [name], MD;  Location: [place];  Service: Open Heart Surgery;  Laterality: N/A;  . WRIST GANGLION EXCISION Right     Current Medications: Current Meds  Medication Sig  . Accu-Chek Softclix Lancets lancets 1 each by Other route  daily. Use to monitor glucose levels daily; E11.8  . acetaminophen (TYLENOL) 500 MG tablet Take 500 mg by mouth as needed.  Marland Kitchen aspirin EC 81 MG tablet Take 1 tablet (81 mg total) by mouth daily.  . benazepril (LOTENSIN) 20 MG tablet Take 1 tablet (20 mg total) by mouth daily.  . Blood Glucose Monitoring Suppl (ACCU-CHEK AVIVA PLUS) w/Device KIT 1 each by Does not apply route daily. Use to monitor glucose levels daily; E11.8  . bromocriptine (PARLODEL) 2.5 MG tablet Take 1 tablet by  mouth once daily  . carvedilol (COREG) 3.125 MG tablet Take 1 tablet (3.125 mg total) by mouth 2 (two) times daily with a meal.  . Cinnamon 500 MG capsule Take 1 capsule by mouth daily.  . Coenzyme Q10 (COQ-10) 100 MG CAPS Take 1 tablet by mouth daily.  . Cyanocobalamin (VITAMIN B-12 PO) Take 1 tablet by mouth daily.  Marland Kitchen ezetimibe (ZETIA) 10 MG tablet Take 1 tablet (10 mg total) by mouth daily.  . ferrous sulfate 325 (65 FE) MG tablet Take 325 mg by mouth daily with breakfast.  . Garlic 1610 MG CAPS Take 1 tablet by mouth daily.  Marland Kitchen glucose blood (ACCU-CHEK AVIVA PLUS) test strip 1 each by Other route daily. Use to monitor glucose levels daily; E11.8  . metFORMIN (GLUCOPHAGE) 500 MG tablet Take 4 tablets by mouth daily  . Multiple Vitamin (MULTIVITAMIN) tablet Take 1 tablet by mouth daily.    . nitroGLYCERIN (NITROSTAT) 0.4 MG SL tablet Place 1 tablet (0.4 mg total) under the tongue every 5 (five) minutes as needed for chest pain.  . Omega-3 Fatty Acids (FISH OIL) 1000 MG CAPS Take 1,000 mg by mouth daily.   . ranolazine (RANEXA) 500 MG 12 hr tablet Take 1 tablet (500 mg total) by mouth 2 (two) times daily.  . repaglinide (PRANDIN) 0.5 MG tablet Take 1 tablet (0.5 mg total) by mouth daily with supper.  . rosuvastatin (CRESTOR) 40 MG tablet Take 1 tablet (40 mg total) by mouth daily.  . sildenafil (VIAGRA) 100 MG tablet TAKE ONE TABLET BY MOUTH DAILY AS NEEDED FOR ERECTILE DYSFUNCTION     Allergies:   Patient has no known allergies.   Social History   Socioeconomic History  . Marital status: Married    Spouse name: Not on file  . Number of children: 5  . Years of education: Not on file  . Highest education level: Not on file  Occupational History  . Occupation: Counsellor  Tobacco Use  . Smoking status: Former Smoker    Packs/Boodram: 0.25    Years: 20.00    Pack years: 5.00    Types: Cigarettes    Quit date: 10/07/2013    Years since quitting: 6.3  . Smokeless tobacco: Never Used  .  Tobacco comment: quit cigarettes, might have a cigar 1 X month  Substance and Sexual Activity  . Alcohol use: Yes    Alcohol/week: 3.0 standard drinks    Types: 3 Standard drinks or equivalent per week    Comment: occasional  . Drug use: No  . Sexual activity: Not Currently    Birth control/protection: None  Other Topics Concern  . 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 Difficulty of Paying  Living Expenses:   Food Insecurity:   . Worried About Charity fundraiser in the Last Year:   . Arboriculturist in the Last Year:   Transportation Needs:   . Film/video editor (Medical):   Marland Kitchen Lack of Transportation (Non-Medical):   Physical Activity:   . Days of Exercise per Week:   . Minutes of Exercise per Session:   Stress:   . Feeling of Stress :   Social Connections:   . Frequency of Communication with Friends and Family:   . Frequency of Social Gatherings with Friends and Family:   . Attends Religious Services:   . Active Member of Clubs or Organizations:   . Attends Archivist Meetings:   Marland Kitchen Marital Status:      Family History: The patient's family history includes Anuerysm in his brother; Colon polyps in his sister; Diabetes in his maternal grandmother, sister, sister, and son; Heart disease in his son; Heart failure in his father; Hyperlipidemia in his sister and sister; Hypertension in his sister; Kidney disease in his sister; Kidney failure in his sister; Other in his brother. There is no history of Heart attack.  ROS:   Please see the history of present illness.    Has low hemoglobin and needs endoscopy.  Also has reflux with regurgitation and difficulty after meals.  This will also be evaluated by GI.  All other systems reviewed and are  negative.  EKGs/Labs/Other Studies Reviewed:    The following studies were reviewed today: We will need to have LVEF assessment and 12 months with echo.  EKG:  EKG sinus rhythm, first-degree AV block, left anterior hemiblock, and a new tracing is not repeated today.  This tracing is from October 26, 2019  Recent Labs: 04/11/2019: TSH 0.63 10/26/2019: ALT 34; BUN 14; Creatinine, Ser 1.10; Potassium 4.1; Sodium 141 01/24/2020: Hemoglobin 12.3; Platelets 130.0  Recent Lipid Panel    Component Value Date/Time   CHOL 142 10/26/2019 0903   TRIG 151 (H) 10/26/2019 0903   HDL 74 10/26/2019 0903   CHOLHDL 1.9 10/26/2019 0903   CHOLHDL 2 04/11/2019 0924   VLDL 28.8 04/11/2019 0924   LDLCALC 43 10/26/2019 0903   LDLDIRECT 204.8 09/05/2013 1559    Physical Exam:    VS:  BP 128/74   Pulse 76   Ht 5' 5.5" (1.664 m)   Wt 187 lb 1.9 oz (84.9 kg)   SpO2 96%   BMI 30.66 kg/m     Wt Readings from Last 3 Encounters:  01/26/20 187 lb 1.9 oz (84.9 kg)  01/24/20 188 lb 2 oz (85.3 kg)  01/03/20 184 lb 6.4 oz (83.6 kg)     GEN: Healthy. No acute distress HEENT: Normal NECK: No JVD. LYMPHATICS: No lymphadenopathy CARDIAC:  RRR without murmur, gallop, or edema. VASCULAR:  Normal Pulses. No bruits. RESPIRATORY:  Clear to auscultation without rales, wheezing or rhonchi  ABDOMEN: Soft, non-tender, non-distended, No pulsatile mass, MUSCULOSKELETAL: No deformity  SKIN: Warm and dry NEUROLOGIC:  Alert and oriented x 3 PSYCHIATRIC:  Normal affect   ASSESSMENT:    1. S/P CABG x 5   2. Essential hypertension   3. Chronic combined systolic and diastolic HF (heart failure) ([place])   4. Pure hypercholesterolemia   5. Type 2 diabetes mellitus with complication, without long-term current use of insulin (HCC)   6. Educated about COVID-19 virus infection    PLAN:    In order of problems listed above:  1. Secondary  prevention 2. Continue current therapy with Lotensin, carvedilol, exercise,  low-salt diet, and weight control. 3. Continue guideline directed therapy including carvedilol and Lotensin. 4. LDL target less than 70, continue Zetia and Crestor.  Most recent LDL was 47 with target being less than 70. 5. Hemoglobin A1c less than 7 is at target.  I will allow for the patient to be on a SGLT2 given history of heart failure. 6. Vaccine has been received.  Social distancing and mask wearing is being practiced.   He is cleared for the upcoming GI work-up.  Okay to hold aspirin for 7 days before the procedure if needed.   Medication Adjustments/Labs and Tests Ordered: Current medicines are reviewed at length with the patient today.  Concerns regarding medicines are outlined above.  No orders of the defined types were placed in this encounter.  No orders of the defined types were placed in this encounter.   Patient Instructions  Medication Instructions:  Your physician recommends that you continue on your current medications as directed. Please refer to the Current Medication list given to you today.  *If you need a refill on your cardiac medications before your next appointment, please call your pharmacy*   Lab Work: None If you have labs (blood work) drawn today and your tests are completely normal, you will receive your results only by: Marland Kitchen MyChart Message (if you have MyChart) OR . A paper copy in the mail If you have any lab test that is abnormal or we need to change your treatment, we will call you to review the results.   Testing/Procedures: None   Follow-Up: At [facility], you and your health needs are our priority.  As part of our continuing mission to provide you with exceptional heart care, we have created designated Provider Care Teams.  These Care Teams include your primary Cardiologist (physician) and Advanced Practice Providers (APPs -  Physician Assistants and Nurse Practitioners) who all work together to provide you with the care you need, when you  need it.  We recommend signing up for the patient portal called "MyChart".  Sign up information is provided on this After Visit Summary.  MyChart is used to connect with patients for Virtual Visits (Telemedicine).  Patients are able to view lab/test results, encounter notes, upcoming appointments, etc.  Non-urgent messages can be sent to your provider as well.   To learn more about what you can do with MyChart, go to [email or web address].    Your next appointment:   12 month(s)  The format for your next appointment:   In Person  Provider:   You may see Jerome Grooms, MD or one of the following Advanced Practice Providers on your designated Care Team:    Truitt Merle, NP  Cecilie Kicks, NP  Kathyrn Drown, NP    Other Instructions      Signed, Jerome Grooms, MD  01/26/2020 8:37 AM    [place]

## 2020-01-26 ENCOUNTER — Ambulatory Visit: Payer: Medicare HMO | Admitting: Interventional Cardiology

## 2020-01-26 ENCOUNTER — Encounter: Payer: Self-pay | Admitting: Interventional Cardiology

## 2020-01-26 ENCOUNTER — Other Ambulatory Visit: Payer: Self-pay

## 2020-01-26 VITALS — BP 128/74 | HR 76 | Ht 65.5 in | Wt 187.1 lb

## 2020-01-26 DIAGNOSIS — E7800 Pure hypercholesterolemia, unspecified: Secondary | ICD-10-CM

## 2020-01-26 DIAGNOSIS — E118 Type 2 diabetes mellitus with unspecified complications: Secondary | ICD-10-CM

## 2020-01-26 DIAGNOSIS — Z7189 Other specified counseling: Secondary | ICD-10-CM | POA: Diagnosis not present

## 2020-01-26 DIAGNOSIS — Z951 Presence of aortocoronary bypass graft: Secondary | ICD-10-CM | POA: Diagnosis not present

## 2020-01-26 DIAGNOSIS — I5042 Chronic combined systolic (congestive) and diastolic (congestive) heart failure: Secondary | ICD-10-CM

## 2020-01-26 DIAGNOSIS — I10 Essential (primary) hypertension: Secondary | ICD-10-CM | POA: Diagnosis not present

## 2020-01-26 NOTE — Patient Instructions (Signed)

## 2020-01-30 ENCOUNTER — Telehealth: Payer: Self-pay | Admitting: Nurse Practitioner

## 2020-01-30 NOTE — Telephone Encounter (Signed)
Beth, can you call patient and schedule his EGD/colonoscopy with Dr. Tarri Glenn, cardiac clearance was obtained by Dr. Daneen Schick and is in Epi. Refer to last office visit thx.

## 2020-01-30 NOTE — Telephone Encounter (Signed)
Called patient. No answer. Left a message on the voicemail instructing him to call back to set up his appointment for Pre-visit, COVID screening and [place] EGD/colon.

## 2020-02-01 ENCOUNTER — Other Ambulatory Visit: Payer: Self-pay | Admitting: Family Medicine

## 2020-02-02 ENCOUNTER — Other Ambulatory Visit: Payer: Self-pay | Admitting: Interventional Cardiology

## 2020-02-06 ENCOUNTER — Other Ambulatory Visit: Payer: Self-pay

## 2020-02-08 ENCOUNTER — Ambulatory Visit (INDEPENDENT_AMBULATORY_CARE_PROVIDER_SITE_OTHER): Payer: Medicare HMO | Admitting: Endocrinology

## 2020-02-08 ENCOUNTER — Encounter: Payer: Self-pay | Admitting: Endocrinology

## 2020-02-08 ENCOUNTER — Other Ambulatory Visit: Payer: Self-pay

## 2020-02-08 VITALS — BP 148/90 | HR 84 | Ht 65.5 in | Wt 187.0 lb

## 2020-02-08 DIAGNOSIS — B351 Tinea unguium: Secondary | ICD-10-CM | POA: Diagnosis not present

## 2020-02-08 DIAGNOSIS — E118 Type 2 diabetes mellitus with unspecified complications: Secondary | ICD-10-CM | POA: Diagnosis not present

## 2020-02-08 LAB — POCT GLYCOSYLATED HEMOGLOBIN (HGB A1C): Hemoglobin A1C: 7 % — AB (ref 4.0–5.6)

## 2020-02-08 MED ORDER — REPAGLINIDE 0.5 MG PO TABS: 0.5000 mg | ORAL_TABLET | Freq: Two times a day (BID) | ORAL | 3 refills | Status: DC

## 2020-02-08 NOTE — Patient Instructions (Addendum)
Your blood pressure is high today.  Please see your primary care provider soon, to have it rechecked check your blood sugar once a Zuba.  vary the time of [place] when you check, between before the 3 meals, and at bedtime.  also check if you have symptoms of your blood sugar being too high or too low.  please keep a record of the readings and bring it to your next appointment here (or you can bring the meter itself).  You can write it on any piece of paper.  please call us sooner if your blood sugar goes below 70, or if you have a lot of readings over 200. Please continue the same medications.   Please see a foot specialist.  you will receive a phone call, about a Toppin and time for an appointment.   Please come back for a follow-up appointment in 3-4 months.

## 2020-02-08 NOTE — Progress Notes (Signed)
Subjective:    Patient ID: Jerome Vaughn, male    DOB: 1950-10-03, 69 y.o.   MRN: [id number]  HPI Pt returns for f/u of diabetes mellitus:  DM type: 2 Dx'ed: 3532 Complications: CAD and renal insuff.   Therapy: 3 oral meds.  DKA: never.  Severe hypoglycemia: never.  Pancreatitis: never.  SDOH: he cannot afford name brand meds Other: he has never been on insulin, but he has learned how; he stopped pioglitizone, due to edema.   Interval history: pt states he feels well in general.  Pt says cbg's vary from 112-137. He takes meds as rx'ed.   Past Medical History:  Diagnosis Date  . Carpal tunnel syndrome    both hands  . Colon polyps   . Coronary artery disease    a. Cath 11/21/2015: Multivessel CAD --> CABG recommended. b. CABG on 11/22/2015:  LIMA-LAD, SVG-Diag, SVG-OM1 and distal Cx, and SVG-PDA.  . Diabetes mellitus   . GERD (gastroesophageal reflux disease)   . History of echocardiogram    a. Echo 4/17: Moderate LVH, EF 50-55%, mild LAE, no pericardial effusion  . Hyperlipidemia   . Hypertension   . Pneumonia     Past Surgical History:  Procedure Laterality Date  . CARDIAC CATHETERIZATION N/A 11/21/2015   Procedure: Left Heart Cath and Coronary Angiography;  Surgeon: [name], MD;  Location: [facility];  Service: Cardiovascular;  Laterality: N/A;  . colonsocopy  12/07/2009   per Dr. [name], benign polyps, repeat in 10 yrs   . CORONARY ARTERY BYPASS GRAFT N/A 11/22/2015   Procedure: CORONARY ARTERY BYPASS GRAFTING (CABG) times five using the left internal mammary, right greater saphenous vein EVH, and left thigh greater saphenous vein EVH;  Surgeon: [name], MD;  Location: [facility];  Service: Open Heart Surgery;  Laterality: N/A;  . frozen shoulder release Right   . INTRAVASCULAR PRESSURE WIRE/FFR STUDY N/A 12/14/2018   Procedure: INTRAVASCULAR PRESSURE WIRE/FFR STUDY;  Surgeon: Jettie Booze, MD;  Location: [facility];  Service: Cardiovascular;   Laterality: N/A;  . LEFT HEART CATH AND CORS/GRAFTS ANGIOGRAPHY N/A 10/27/2017   Procedure: LEFT HEART CATH AND CORS/GRAFTS ANGIOGRAPHY;  Surgeon: [name], MD;  Location: [facility];  Service: Cardiovascular;  Laterality: N/A;  . LEFT HEART CATH AND CORS/GRAFTS ANGIOGRAPHY N/A 12/14/2018   Procedure: LEFT HEART CATH AND CORS/GRAFTS ANGIOGRAPHY;  Surgeon: Jettie Booze, MD;  Location: [facility];  Service: Cardiovascular;  Laterality: N/A;  . TEE WITHOUT CARDIOVERSION N/A 11/22/2015   Procedure: TRANSESOPHAGEAL ECHOCARDIOGRAM (TEE);  Surgeon: [name], MD;  Location: [facility];  Service: Open Heart Surgery;  Laterality: N/A;  . WRIST GANGLION EXCISION Right     Social History   Socioeconomic History  . Marital status: Married    Spouse name: Not on file  . Number of children: 5  . Years of education: Not on file  . Highest education level: Not on file  Occupational History  . Occupation: Counsellor  Tobacco Use  . Smoking status: Former Smoker    Packs/Trant: 0.25    Years: 20.00    Pack years: 5.00    Types: Cigarettes    Quit date: 10/07/2013    Years since quitting: 6.3  . Smokeless tobacco: Never Used  . Tobacco comment: quit cigarettes, might have a cigar 1 X month  Substance and Sexual Activity  . Alcohol use: Yes    Alcohol/week: 3.0 standard drinks  Types: 3 Standard drinks or equivalent per week    Comment: occasional  . Drug use: No  . Sexual activity: Not Currently    Birth control/protection: None  Other Topics Concern  . 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 Difficulty of Paying Living Expenses:   Food Insecurity:   . Worried About Charity fundraiser in the Last Year:   . Arboriculturist in the Last Year:     Transportation Needs:   . Film/video editor (Medical):   Marland Kitchen Lack of Transportation (Non-Medical):   Physical Activity:   . Days of Exercise per Week:   . Minutes of Exercise per Session:   Stress:   . Feeling of Stress :   Social Connections:   . Frequency of Communication with Friends and Family:   . Frequency of Social Gatherings with Friends and Family:   . Attends Religious Services:   . Active Member of Clubs or Organizations:   . Attends Archivist Meetings:   Marland Kitchen Marital Status:   Intimate Partner Violence:   . Fear of Current or Ex-Partner:   . Emotionally Abused:   Marland Kitchen Physically Abused:   . Sexually Abused:     Current Outpatient Medications on File Prior to Visit  Medication Sig Dispense Refill  . Accu-Chek Softclix Lancets lancets 1 each by Other route daily. Use to monitor glucose levels daily; E11.8 100 each 2  . acetaminophen (TYLENOL) 500 MG tablet Take 500 mg by mouth as needed.    Marland Kitchen aspirin EC 81 MG tablet Take 1 tablet (81 mg total) by mouth daily.    . benazepril (LOTENSIN) 20 MG tablet Take 1 tablet (20 mg total) by mouth daily. 90 tablet 1  . Blood Glucose Monitoring Suppl (ACCU-CHEK AVIVA PLUS) w/Device KIT 1 each by Does not apply route daily. Use to monitor glucose levels daily; E11.8 1 kit 0  . carvedilol (COREG) 3.125 MG tablet Take 1 tablet (3.125 mg total) by mouth 2 (two) times daily with a meal. 180 tablet 1  . Cinnamon 500 MG capsule Take 1 capsule by mouth daily.    . Coenzyme Q10 (COQ-10) 100 MG CAPS Take 1 tablet by mouth daily.    . Cyanocobalamin (VITAMIN B-12 PO) Take 1 tablet by mouth daily.    Marland Kitchen ezetimibe (ZETIA) 10 MG tablet Take 1 tablet (10 mg total) by mouth daily. 90 tablet 2  . ferrous sulfate 325 (65 FE) MG tablet Take 325 mg by mouth daily with breakfast.    . Garlic 9458 MG CAPS Take 1 tablet by mouth daily.    Marland Kitchen glucose blood (ACCU-CHEK AVIVA PLUS) test strip 1 each by Other route daily. Use to monitor glucose levels  daily; E11.8 100 each 2  . metFORMIN (GLUCOPHAGE) 500 MG tablet Take 4 tablets by mouth daily 360 tablet 2  . Multiple Vitamin (MULTIVITAMIN) tablet Take 1 tablet by mouth daily.      . nitroGLYCERIN (NITROSTAT) 0.4 MG SL tablet Place 1 tablet (0.4 mg total) under the tongue every 5 (five) minutes as needed for chest pain. 75 tablet 1  . Omega-3 Fatty Acids (FISH OIL) 1000 MG CAPS Take 1,000 mg by mouth daily.     Marland Kitchen  ranolazine (RANEXA) 500 MG 12 hr tablet Take 1 tablet (500 mg total) by mouth 2 (two) times daily. 180 tablet 3  . rosuvastatin (CRESTOR) 40 MG tablet Take 1 tablet (40 mg total) by mouth daily. 90 tablet 3  . sildenafil (VIAGRA) 100 MG tablet TAKE ONE TABLET BY MOUTH DAILY AS NEEDED FOR ERECTILE DYSFUNCTION 30 tablet 0   No current facility-administered medications on file prior to visit.    No Known Allergies  Family History  Problem Relation Age of Onset  . Heart failure Father   . Anuerysm Brother        AAA  . Hyperlipidemia Sister   . Diabetes Sister   . Hypertension Sister   . Kidney failure Sister   . Diabetes Maternal Grandmother   . Hyperlipidemia Sister   . Diabetes Sister   . Colon polyps Sister   . Kidney disease Sister   . Other Brother        COVID 19  . Diabetes Son   . Heart disease Son   . Heart attack Neg Hx     BP (!) 148/90   Pulse 84   Ht 5' 5.5" (1.664 m)   Wt 187 lb (84.8 kg)   SpO2 95%   BMI 30.65 kg/m    Review of Systems He reports foot pain    Objective:   Physical Exam VITAL SIGNS:  See vs page GENERAL: no distress Pulses: dorsalis pedis intact bilat.   MSK: no deformity of the feet CV: no leg edema Skin:  no ulcer on the feet.  normal color and temp on the feet. Neuro: sensation is intact to touch on the feet Ext: there is bilateral onychomycosis of the toenails.  On both feet , 2nd toenail impacts great toe.    Lab Results  Component Value Date   HGBA1C 7.0 (A) 02/08/2020        Assessment & Plan:  HTN: is  noted today Foot pain, new: uncertain etiology Type 2 DM, with CRI: well-controlled   Patient Instructions  Your blood pressure is high today.  Please see your primary care provider soon, to have it rechecked check your blood sugar once a Rowen.  vary the time of [place] when you check, between before the 3 meals, and at bedtime.  also check if you have symptoms of your blood sugar being too high or too low.  please keep a record of the readings and bring it to your next appointment here (or you can bring the meter itself).  You can write it on any piece of paper.  please call us sooner if your blood sugar goes below 70, or if you have a lot of readings over 200. Please continue the same medications.   Please see a foot specialist.  you will receive a phone call, about a Strauss and time for an appointment.   Please come back for a follow-up appointment in 3-4 months.

## 2020-02-09 ENCOUNTER — Other Ambulatory Visit: Payer: Self-pay | Admitting: Endocrinology

## 2020-02-10 ENCOUNTER — Other Ambulatory Visit: Payer: Self-pay

## 2020-02-10 DIAGNOSIS — D649 Anemia, unspecified: Secondary | ICD-10-CM

## 2020-02-10 DIAGNOSIS — D509 Iron deficiency anemia, unspecified: Secondary | ICD-10-CM

## 2020-02-14 ENCOUNTER — Telehealth: Payer: Self-pay | Admitting: Nurse Practitioner

## 2020-02-14 ENCOUNTER — Other Ambulatory Visit: Payer: Self-pay

## 2020-02-15 ENCOUNTER — Other Ambulatory Visit: Payer: Self-pay

## 2020-02-15 ENCOUNTER — Telehealth: Payer: Self-pay | Admitting: Family Medicine

## 2020-02-15 ENCOUNTER — Encounter: Payer: Self-pay | Admitting: Gastroenterology

## 2020-02-15 ENCOUNTER — Ambulatory Visit (AMBULATORY_SURGERY_CENTER): Payer: Self-pay | Admitting: *Deleted

## 2020-02-15 VITALS — Temp 98.1°F | Ht 65.5 in | Wt 192.0 lb

## 2020-02-15 DIAGNOSIS — Z86010 Personal history of colonic polyps: Secondary | ICD-10-CM

## 2020-02-15 DIAGNOSIS — K219 Gastro-esophageal reflux disease without esophagitis: Secondary | ICD-10-CM

## 2020-02-15 MED ORDER — NA SULFATE-K SULFATE-MG SULF 17.5-3.13-1.6 GM/177ML PO SOLN: 1.0000 | Freq: Once | ORAL | 0 refills | Status: AC

## 2020-02-15 NOTE — Progress Notes (Signed)
  Chronic Care Management   Outreach Note  02/15/2020 Name: Jerome Vaughn MRN: [id number] DOB: 10/03/1950  Referred by: [name], MD Reason for referral : No chief complaint on file.   A second unsuccessful telephone outreach was attempted today. The patient was referred to pharmacist for assistance with care management and care coordination.  Follow Up Plan:   Prathima Ghanta Upstream Scheduler

## 2020-02-15 NOTE — Progress Notes (Signed)

## 2020-02-15 NOTE — Chronic Care Management (AMB) (Signed)
  Chronic Care Management   Note  02/15/2020 Name: Jerome Vaughn MRN: [id number] DOB: [birth year]  Jerome Vaughn is a 69 y.o. year old male who is a primary care patient of [name], MD. I reached out to [facility] by phone today in response to a referral sent by Jerome Vaughn's PCP, [name], MD.   Jerome Vaughn was given information about Chronic Care Management services today including:  1. CCM service includes personalized support from designated clinical staff supervised by his physician, including individualized plan of care and coordination with other care providers 2. 24/7 contact phone numbers for assistance for urgent and routine care needs. 3. Service will only be billed when office clinical staff spend 20 minutes or more in a month to coordinate care. 4. Only one practitioner may furnish and bill the service in a calendar month. 5. The patient may stop CCM services at any time (effective at the end of the month) by phone call to the office staff.   Patient agreed to services and verbal consent obtained.   Follow up plan:   [place]

## 2020-02-17 ENCOUNTER — Telehealth: Payer: Self-pay | Admitting: Adult Health

## 2020-02-17 NOTE — Telephone Encounter (Signed)
Received a new hem referral fro [facility] for anemia. [name] returned my call and has been scheduled to see Mendel Ryder on 5/26 at 830am w/labs at 8am. Pt aware to arrive 15 minutes early.

## 2020-02-20 ENCOUNTER — Other Ambulatory Visit: Payer: Self-pay | Admitting: Adult Health

## 2020-02-20 DIAGNOSIS — D61818 Other pancytopenia: Secondary | ICD-10-CM

## 2020-02-20 NOTE — Progress Notes (Unsigned)
[facility]  [phone] Fax:([phone]) [phone]     ID: Jerome Vaughn DOB: 1951/08/06  MR#: [id number]  MHD#:[id number]  Patient Care Team: [name], MD as PCP - General (Family Medicine) [name], MD as PCP - Cardiology (Cardiology) [name], MD as Consulting Physician (Family Medicine) Earnie Larsson, [facility] as Pharmacist (Pharmacist) Scot Dock, NP OTHER MD:  CHIEF COMPLAINT:   CURRENT TREATMENT:    HISTORY OF CURRENT ILLNESS:   The patient's subsequent history is as detailed below.  INTERVAL HISTORY:    REVIEW OF SYSTEMS:  PAST MEDICAL HISTORY: Past Medical History:  Diagnosis Date  . Carpal tunnel syndrome    both hands  . Colon polyps   . Coronary artery disease    a. Cath 11/21/2015: Multivessel CAD --> CABG recommended. b. CABG on 11/22/2015:  LIMA-LAD, SVG-Diag, SVG-OM1 and distal Cx, and SVG-PDA.  . Diabetes mellitus   . GERD (gastroesophageal reflux disease)   . History of echocardiogram    a. Echo 4/17: Moderate LVH, EF 50-55%, mild LAE, no pericardial effusion  . Hyperlipidemia   . Hypertension   . Pneumonia     PAST SURGICAL HISTORY: Past Surgical History:  Procedure Laterality Date  . CARDIAC CATHETERIZATION N/A 11/21/2015   Procedure: Left Heart Cath and Coronary Angiography;  Surgeon: [name], MD;  Location: [facility];  Service: Cardiovascular;  Laterality: N/A;  . COLONOSCOPY    . colonsocopy  12/07/2009   per Dr. [name], benign polyps, repeat in 10 yrs   . CORONARY ARTERY BYPASS GRAFT N/A 11/22/2015   Procedure: CORONARY ARTERY BYPASS GRAFTING (CABG) times five using the left internal mammary, right greater saphenous vein EVH, and left thigh greater saphenous vein EVH;  Surgeon: [name], MD;  Location: [facility];  Service: Open Heart Surgery;  Laterality: N/A;  . frozen shoulder release Right   . INTRAVASCULAR PRESSURE WIRE/FFR STUDY N/A 12/14/2018   Procedure: INTRAVASCULAR  PRESSURE WIRE/FFR STUDY;  Surgeon: Jettie Booze, MD;  Location: [facility];  Service: Cardiovascular;  Laterality: N/A;  . LEFT HEART CATH AND CORS/GRAFTS ANGIOGRAPHY N/A 10/27/2017   Procedure: LEFT HEART CATH AND CORS/GRAFTS ANGIOGRAPHY;  Surgeon: [name], MD;  Location: [facility];  Service: Cardiovascular;  Laterality: N/A;  . LEFT HEART CATH AND CORS/GRAFTS ANGIOGRAPHY N/A 12/14/2018   Procedure: LEFT HEART CATH AND CORS/GRAFTS ANGIOGRAPHY;  Surgeon: Jettie Booze, MD;  Location: [facility];  Service: Cardiovascular;  Laterality: N/A;  . TEE WITHOUT CARDIOVERSION N/A 11/22/2015   Procedure: TRANSESOPHAGEAL ECHOCARDIOGRAM (TEE);  Surgeon: [name], MD;  Location: [facility];  Service: Open Heart Surgery;  Laterality: N/A;  . WRIST GANGLION EXCISION Right     FAMILY HISTORY Family History  Problem Relation Age of Onset  . Heart failure Father   . Anuerysm Brother        AAA  . Hyperlipidemia Sister   . Diabetes Sister   . Hypertension Sister   . Kidney failure Sister   . Diabetes Maternal Grandmother   . Hyperlipidemia Sister   . Diabetes Sister   . Colon polyps Sister   . Kidney disease Sister   . Other Brother        COVID 19  . Diabetes Son   . Heart disease Son   . Heart attack Neg Hx   . Colon cancer Neg Hx   . Stomach cancer Neg Hx   .  Esophageal cancer Neg Hx       SOCIAL HISTORY:      ADVANCED DIRECTIVES:    HEALTH MAINTENANCE: Social History   Tobacco Use  . Smoking status: Former Smoker    Packs/Voges: 0.25    Years: 20.00    Pack years: 5.00    Types: Cigarettes    Quit date: 10/07/2013    Years since quitting: 6.3  . Smokeless tobacco: Never Used  . Tobacco comment: quit cigarettes, might have a cigar 1 X month  Substance Use Topics  . Alcohol use: Yes    Alcohol/week: 3.0 - 4.0 standard drinks    Types: 3 - 4 Standard drinks or equivalent per week  . Drug use: No     Colonoscopy:  PAP:  Bone  density:   No Known Allergies  Current Outpatient Medications  Medication Sig Dispense Refill  . Accu-Chek Softclix Lancets lancets 1 each by Other route daily. Use to monitor glucose levels daily; E11.8 100 each 2  . acetaminophen (TYLENOL) 500 MG tablet Take 500 mg by mouth as needed.    . APPLE CIDER VINEGAR PO Take by mouth.    Marland Kitchen aspirin EC 81 MG tablet Take 1 tablet (81 mg total) by mouth daily.    . benazepril (LOTENSIN) 20 MG tablet Take 1 tablet (20 mg total) by mouth daily. 90 tablet 1  . Blood Glucose Monitoring Suppl (ACCU-CHEK AVIVA PLUS) w/Device KIT 1 each by Does not apply route daily. Use to monitor glucose levels daily; E11.8 1 kit 0  . bromocriptine (PARLODEL) 2.5 MG tablet Take 1 tablet by mouth once daily 90 tablet 0  . carvedilol (COREG) 3.125 MG tablet Take 1 tablet (3.125 mg total) by mouth 2 (two) times daily with a meal. 180 tablet 1  . Cinnamon 500 MG capsule Take 1 capsule by mouth daily.    . Coenzyme Q10 (COQ-10) 100 MG CAPS Take 1 tablet by mouth daily.    . Cyanocobalamin (VITAMIN B-12 PO) Take 1 tablet by mouth daily.    Marland Kitchen ezetimibe (ZETIA) 10 MG tablet Take 1 tablet (10 mg total) by mouth daily. 90 tablet 2  . ferrous sulfate 325 (65 FE) MG tablet Take 325 mg by mouth daily with breakfast.    . Garlic 4585 MG CAPS Take 1 tablet by mouth daily.    Marland Kitchen glucose blood (ACCU-CHEK AVIVA PLUS) test strip 1 each by Other route daily. Use to monitor glucose levels daily; E11.8 100 each 2  . metFORMIN (GLUCOPHAGE) 500 MG tablet Take 4 tablets by mouth daily 360 tablet 2  . Multiple Vitamin (MULTIVITAMIN) tablet Take 1 tablet by mouth daily.      . nitroGLYCERIN (NITROSTAT) 0.4 MG SL tablet Place 1 tablet (0.4 mg total) under the tongue every 5 (five) minutes as needed for chest pain. 75 tablet 1  . Omega-3 Fatty Acids (FISH OIL) 1000 MG CAPS Take 1,000 mg by mouth daily.     . ranolazine (RANEXA) 500 MG 12 hr tablet Take 1 tablet (500 mg total) by mouth 2 (two) times  daily. 180 tablet 3  . repaglinide (PRANDIN) 0.5 MG tablet Take 1 tablet (0.5 mg total) by mouth 2 (two) times daily before a meal. 180 tablet 3  . rosuvastatin (CRESTOR) 40 MG tablet Take 1 tablet (40 mg total) by mouth daily. 90 tablet 3  . sildenafil (VIAGRA) 100 MG tablet TAKE ONE TABLET BY MOUTH DAILY AS NEEDED FOR ERECTILE DYSFUNCTION 30 tablet 0  No current facility-administered medications for this visit.    OBJECTIVE:  There were no vitals filed for this visit.   There is no height or weight on file to calculate BMI.   Wt Readings from Last 3 Encounters:  02/15/20 192 lb (87.1 kg)  02/08/20 187 lb (84.8 kg)  01/26/20 187 lb 1.9 oz (84.9 kg)      ECOG FS:{CHL ONC [id number]} GENERAL: Patient is a well appearing male in no acute distress HEENT:  Sclerae anicteric.  Oropharynx clear and moist. No ulcerations or evidence of oropharyngeal candidiasis. Neck is supple.  NODES:  No cervical, supraclavicular, or axillary lymphadenopathy palpated.  BREAST EXAM:  Deferred. LUNGS:  Clear to auscultation bilaterally.  No wheezes or rhonchi. HEART:  Regular rate and rhythm. No murmur appreciated. ABDOMEN:  Soft, nontender.  Positive, normoactive bowel sounds. No organomegaly palpated. MSK:  No focal spinal tenderness to palpation. Full range of motion bilaterally in the upper extremities. EXTREMITIES:  No peripheral edema.   SKIN:  Clear with no obvious rashes or skin changes. No nail dyscrasia. NEURO:  Nonfocal. Well oriented.  Appropriate affect.   LAB RESULTS:  CMP     Component Value Date/Time   NA 141 10/26/2019 0903   K 4.1 10/26/2019 0903   CL 105 10/26/2019 0903   CO2 21 10/26/2019 0903   GLUCOSE 133 (H) 10/26/2019 0903   GLUCOSE 180 (H) 04/11/2019 0924   BUN 14 10/26/2019 0903   CREATININE 1.10 10/26/2019 0903   CREATININE 1.19 11/14/2015 1135   CALCIUM 9.4 10/26/2019 0903   PROT 7.6 10/26/2019 0903   ALBUMIN 4.4 10/26/2019 0903   AST 34 10/26/2019 0903    ALT 34 10/26/2019 0903   ALKPHOS 37 (L) 10/26/2019 0903   BILITOT 0.4 10/26/2019 0903   GFRNONAA 70 10/26/2019 0903   GFRAA 80 10/26/2019 0903    No results found for: TOTALPROTELP, ALBUMINELP, A1GS, A2GS, BETS, BETA2SER, GAMS, MSPIKE, SPEI  No results found for: Nils Pyle, [facility]  Lab Results  Component Value Date   WBC 3.2 (L) 01/24/2020   NEUTROABS 1.8 04/11/2019   HGB 12.3 (L) 01/24/2020   HCT 36.3 (L) 01/24/2020   MCV 89.9 01/24/2020   PLT 130.0 (L) 01/24/2020      Chemistry      Component Value Date/Time   NA 141 10/26/2019 0903   K 4.1 10/26/2019 0903   CL 105 10/26/2019 0903   CO2 21 10/26/2019 0903   BUN 14 10/26/2019 0903   CREATININE 1.10 10/26/2019 0903   CREATININE 1.19 11/14/2015 1135      Component Value Date/Time   CALCIUM 9.4 10/26/2019 0903   ALKPHOS 37 (L) 10/26/2019 0903   AST 34 10/26/2019 0903   ALT 34 10/26/2019 0903   BILITOT 0.4 10/26/2019 0903       No results found for: LABCA2  No components found for: OTLXBW620  No results for input(s): INR in the last 168 hours.  No results found for: LABCA2  No results found for: BTD974  No results found for: BUL845  No results found for: XMI680  No results found for: CA2729  No components found for: HGQUANT  No results found for: CEA1 / No results found for: CEA1   No results found for: AFPTUMOR  No results found for: CHROMOGRNA  No results found for: PSA1  No visits with results within 3 Amaral(s) from this visit.  Latest known visit with results is:  Office Visit on 02/08/2020  Component Date Value Ref  Range Status  . Hemoglobin A1C 02/08/2020 7.0* 4.0 - 5.6 % Final    (this displays the last labs from the last 3 days)  No results found for: TOTALPROTELP, ALBUMINELP, A1GS, A2GS, BETS, BETA2SER, GAMS, MSPIKE, SPEI (this displays SPEP labs)  No results found for: KPAFRELGTCHN, LAMBDASER, KAPLAMBRATIO (kappa/lambda light chains)  No results found for:  HGBA, HGBA2QUANT, HGBFQUANT, HGBSQUAN (Hemoglobinopathy evaluation)   No results found for: LDH  Lab Results  Component Value Date   IRON 99 01/24/2020   IRON 104 01/24/2020   TIBC 418 01/24/2020   IRONPCTSAT 20.1 01/24/2020   IRONPCTSAT 25 01/24/2020   (Iron and TIBC)  Lab Results  Component Value Date   FERRITIN 12.4 (L) 01/24/2020    Urinalysis    Component Value Date/Time   COLORURINE YELLOW 05/19/2013 2205   APPEARANCEUR CLEAR 05/19/2013 2205   LABSPEC 1.034 (H) 05/19/2013 2205   PHURINE 6.5 05/19/2013 2205   GLUCOSEU >1000 (A) 05/19/2013 2205   HGBUR NEGATIVE 05/19/2013 2205   BILIRUBINUR neg 04/11/2019 1007   KETONESUR NEGATIVE 05/19/2013 2205   PROTEINUR Positive (A) 04/11/2019 1007   PROTEINUR NEGATIVE 05/19/2013 2205   UROBILINOGEN 0.2 04/11/2019 1007   UROBILINOGEN 1.0 05/19/2013 2205   NITRITE neg 04/11/2019 1007   NITRITE NEGATIVE 05/19/2013 2205   LEUKOCYTESUR Negative 04/11/2019 1007     STUDIES: No results found.    ASSESSMENT: 69 y.o.   PLAN:    Total encounter time: *** minutes  Wilber Bihari, NP 02/20/20 1:51 PM Medical Oncology and Hematology [facility] [place], [place] [address] Tel. [phone]    Fax. [phone]  *Total Encounter Time as defined by the Centers for Medicare and Medicaid Services includes, in addition to the face-to-face time of a patient visit (documented in the note above) non-face-to-face time: obtaining and reviewing outside history, ordering and reviewing medications, tests or procedures, care coordination (communications with other health care professionals or caregivers) and documentation in the medical record.

## 2020-02-21 ENCOUNTER — Ambulatory Visit (HOSPITAL_COMMUNITY)
Admission: RE | Admit: 2020-02-21 | Discharge: 2020-02-21 | Disposition: A | Discharge status: Home / Self Care | Payer: Medicare HMO | Source: Ambulatory Visit | Attending: Nurse Practitioner | Admitting: Nurse Practitioner

## 2020-02-21 ENCOUNTER — Other Ambulatory Visit: Payer: Self-pay

## 2020-02-21 DIAGNOSIS — D61818 Other pancytopenia: Secondary | ICD-10-CM | POA: Diagnosis not present

## 2020-02-21 DIAGNOSIS — D649 Anemia, unspecified: Secondary | ICD-10-CM | POA: Diagnosis not present

## 2020-02-22 ENCOUNTER — Inpatient Hospital Stay: Payer: Medicare HMO

## 2020-02-22 ENCOUNTER — Encounter: Payer: Self-pay | Admitting: Adult Health

## 2020-02-22 ENCOUNTER — Inpatient Hospital Stay: Payer: Medicare HMO | Attending: Adult Health | Admitting: Adult Health

## 2020-02-22 ENCOUNTER — Other Ambulatory Visit: Payer: Self-pay

## 2020-02-22 DIAGNOSIS — Z7984 Long term (current) use of oral hypoglycemic drugs: Secondary | ICD-10-CM | POA: Diagnosis not present

## 2020-02-22 DIAGNOSIS — D509 Iron deficiency anemia, unspecified: Secondary | ICD-10-CM | POA: Insufficient documentation

## 2020-02-22 DIAGNOSIS — D500 Iron deficiency anemia secondary to blood loss (chronic): Secondary | ICD-10-CM

## 2020-02-22 DIAGNOSIS — Z833 Family history of diabetes mellitus: Secondary | ICD-10-CM | POA: Diagnosis not present

## 2020-02-22 DIAGNOSIS — I10 Essential (primary) hypertension: Secondary | ICD-10-CM | POA: Insufficient documentation

## 2020-02-22 DIAGNOSIS — Z79899 Other long term (current) drug therapy: Secondary | ICD-10-CM | POA: Insufficient documentation

## 2020-02-22 DIAGNOSIS — E785 Hyperlipidemia, unspecified: Secondary | ICD-10-CM

## 2020-02-22 DIAGNOSIS — Z8249 Family history of ischemic heart disease and other diseases of the circulatory system: Secondary | ICD-10-CM

## 2020-02-22 DIAGNOSIS — E119 Type 2 diabetes mellitus without complications: Secondary | ICD-10-CM

## 2020-02-22 DIAGNOSIS — Z87891 Personal history of nicotine dependence: Secondary | ICD-10-CM | POA: Insufficient documentation

## 2020-02-22 DIAGNOSIS — D61818 Other pancytopenia: Secondary | ICD-10-CM | POA: Diagnosis not present

## 2020-02-22 LAB — SEDIMENTATION RATE: Sed Rate: 11 mm/hr (ref 0–16)

## 2020-02-22 LAB — CMP (CANCER CENTER ONLY)
ALT: 42 U/L (ref 0–44)
AST: 34 U/L (ref 15–41)
Albumin: 3.8 g/dL (ref 3.5–5.0)
Alkaline Phosphatase: 33 U/L — ABNORMAL LOW (ref 38–126)
Anion gap: 6 (ref 5–15)
BUN: 18 mg/dL (ref 8–23)
CO2: 24 mmol/L (ref 22–32)
Calcium: 8.8 mg/dL — ABNORMAL LOW (ref 8.9–10.3)
Chloride: 108 mmol/L (ref 98–111)
Creatinine: 1.23 mg/dL (ref 0.61–1.24)
GFR, Est AFR Am: >60 mL/min (ref >60)
GFR, Estimated: >60 mL/min (ref >60)
Glucose, Bld: 213 mg/dL — ABNORMAL HIGH (ref 70–99)
Potassium: 4.1 mmol/L (ref 3.5–5.1)
Sodium: 138 mmol/L (ref 135–145)
Total Bilirubin: 0.5 mg/dL (ref 0.3–1.2)
Total Protein: 8 g/dL (ref 6.5–8.1)

## 2020-02-22 LAB — IRON AND TIBC
Iron: 50 ug/dL (ref 42–163)
Saturation Ratios: 11 % — ABNORMAL LOW (ref 20–55)
TIBC: 466 ug/dL — ABNORMAL HIGH (ref 202–409)
UIBC: 416 ug/dL — ABNORMAL HIGH (ref 117–376)

## 2020-02-22 LAB — CBC WITH DIFFERENTIAL (CANCER CENTER ONLY)
Abs Immature Granulocytes: 0.01 10*3/uL (ref 0.00–0.07)
Basophils Absolute: 0 10*3/uL (ref 0.0–0.1)
Basophils Relative: 1 %
Eosinophils Absolute: 0 10*3/uL (ref 0.0–0.5)
Eosinophils Relative: 1 %
HCT: 37.1 % — ABNORMAL LOW (ref 39.0–52.0)
Hemoglobin: 12.2 g/dL — ABNORMAL LOW (ref 13.0–17.0)
Immature Granulocytes: 0 %
Lymphocytes Relative: 30 %
Lymphs Abs: 1.2 10*3/uL (ref 0.7–4.0)
MCH: 29.5 pg (ref 26.0–34.0)
MCHC: 32.9 g/dL (ref 30.0–36.0)
MCV: 89.6 fL (ref 80.0–100.0)
Monocytes Absolute: 0.4 10*3/uL (ref 0.1–1.0)
Monocytes Relative: 11 %
Neutro Abs: 2.2 10*3/uL (ref 1.7–7.7)
Neutrophils Relative %: 57 %
Platelet Count: 137 10*3/uL — ABNORMAL LOW (ref 150–400)
RBC: 4.14 MIL/uL — ABNORMAL LOW (ref 4.22–5.81)
RDW: 12.8 % (ref 11.5–15.5)
WBC Count: 3.8 10*3/uL — ABNORMAL LOW (ref 4.0–10.5)
nRBC: 0 % (ref 0.0–0.2)

## 2020-02-22 LAB — HIV ANTIBODY (ROUTINE TESTING W REFLEX): HIV Screen 4th Generation wRfx: NONREACTIVE

## 2020-02-22 LAB — SAVE SMEAR(SSMR), FOR PROVIDER SLIDE REVIEW

## 2020-02-22 LAB — HEPATITIS B CORE ANTIBODY, TOTAL: Hep B Core Total Ab: NONREACTIVE

## 2020-02-22 LAB — RETICULOCYTES
Immature Retic Fract: 14 % (ref 2.3–15.9)
RBC.: 4.08 MIL/uL — ABNORMAL LOW (ref 4.22–5.81)
Retic Count, Absolute: 54.7 10*3/uL (ref 19.0–186.0)
Retic Ct Pct: 1.3 % (ref 0.4–3.1)

## 2020-02-22 LAB — C-REACTIVE PROTEIN: CRP: 0.6 mg/dL (ref <1.0)

## 2020-02-22 LAB — FERRITIN: Ferritin: 13 ng/mL — ABNORMAL LOW (ref 24–336)

## 2020-02-22 LAB — VITAMIN B12: Vitamin B-12: 592 pg/mL (ref 180–914)

## 2020-02-22 LAB — HEPATITIS C ANTIBODY: HCV Ab: NONREACTIVE

## 2020-02-22 LAB — LACTATE DEHYDROGENASE: LDH: 198 U/L — ABNORMAL HIGH (ref 98–192)

## 2020-02-22 LAB — HEPATITIS B SURFACE ANTIBODY,QUALITATIVE: Hep B S Ab: NONREACTIVE

## 2020-02-22 LAB — FOLATE: Folate: 16.1 ng/mL (ref >5.9)

## 2020-02-22 NOTE — Progress Notes (Addendum)
[place]  [phone] [phone]     ID: Jerome Vaughn DOB: 1950-12-21  MR#: [id number]  PYP#:[id number]  Patient Care Team: [name], MD as PCP - General (Family Medicine) [name], MD as PCP - Cardiology (Cardiology) Earnie Larsson, [facility] as Pharmacist (Pharmacist) Thornton Park, MD as Consulting Physician (Gastroenterology) Magrinat, Virgie Dad, MD as Consulting Physician (Oncology) [name], MD as Consulting Physician (Endocrinology) Scot Dock, NP OTHER MD:  CHIEF COMPLAINT: pancytopenia  CURRENT TREATMENT: observation   HISTORY OF CURRENT ILLNESS:  Jerome Vaughn is here for evaluation of pancytopenia that was first noted about 4 weeks ago.  He says that from April to May he noted bright red bleeding from his rectum.  He says that this was not just blood on toilet paper, but rather, in the bowl and dripping.  He denies constipation during this time period.  This was accompanied by GI growling and indigestion.  Due to this he changed around his diet and started taking Pepcid.  He also got into see his PCP and get a GI referral which took place on 01/24/2020 when the pancytopenia was noted.  An abdominal ultrasound was performed on 02/21/2020 which showed no enlarged spleen, or liver issues.    His CBC has been as follows:  Results for QUINTAN, SALDIVAR (MRN [id number]) as of 02/22/2020 10:07  Ref. Range 10/15/2017 12:38 04/08/2018 08:51 12/12/2018 19:08 04/11/2019 09:24 10/26/2019 09:03 01/24/2020 12:44  WBC Latest Ref Range: 4.0 - 10.5 K/uL 4.7 4.0 6.5 3.1 (L) 4.2 3.2 (L)  RBC Latest Ref Range: 4.22 - 5.81 Mil/uL 4.59 4.67 4.40 3.91 (L) 4.11 (L) 4.03 (L)  Hemoglobin Latest Ref Range: 13.0 - 17.0 g/dL 13.3 14.2 13.5 12.1 (L) 12.8 (L) 12.3 (L)  HCT Latest Ref Range: 39.0 - 52.0 % 40.6 41.7 41.6 35.3 (L) 37.6 36.3 (L)  MCV Latest Ref Range: 78.0 - 100.0 fl 89 89.3 94.5 90.4 92 89.9  MCH Latest Ref Range: 26.6 - 33.0 pg 29.0  30.7  31.1     MCHC Latest Ref Range: 30.0 - 36.0 g/dL 32.8 34.1 32.5 34.2 34.0 33.9  RDW Latest Ref Range: 11.5 - 15.5 % 14.4 13.7 13.0 13.9 12.4 13.4  Platelets Latest Ref Range: 150.0 - 400.0 K/uL 143 (L) 136.0 (L) 125 (L) 130.0 (L) 140 (L) 130.0 (L)      The patient's subsequent history is as detailed below.  INTERVAL HISTORY:  Jerome Vaughn notes that he is feeling generally well.  He reviewed the above with me, and that based on his labs, and his upcoming studies with EGD and colonoscopy, his GI doctor wanted further evaluation to ensure there was nothing else going on.  He is here today to do just that. He notes that he is taking iron daily and is tolerating it without difficulty.  He notes that once he made dietary changes, and starting taking pepcid the bleeding improved.  He denies any fatigue, lymphadenopathy, or unintentional weight loss.     REVIEW OF SYSTEMS:  Jerome Vaughn is feeling well today and a detailed ROS was otherwise non contributory.    PAST MEDICAL HISTORY: Past Medical History:  Diagnosis Date  . Carpal tunnel syndrome    both hands  . Colon polyps   . Coronary artery disease    a. Cath 11/21/2015: Multivessel CAD --> CABG recommended. b. CABG on 11/22/2015:  LIMA-LAD, SVG-Diag, SVG-OM1 and distal Cx, and SVG-PDA.  . Diabetes mellitus   . GERD (gastroesophageal  reflux disease)   . History of echocardiogram    a. Echo 4/17: Moderate LVH, EF 50-55%, mild LAE, no pericardial effusion  . Hyperlipidemia   . Hypertension   . Pneumonia     PAST SURGICAL HISTORY: Past Surgical History:  Procedure Laterality Date  . CARDIAC CATHETERIZATION N/A 11/21/2015   Procedure: Left Heart Cath and Coronary Angiography;  Surgeon: [name], MD;  Location: [place] [facility];  Service: Cardiovascular;  Laterality: N/A;  . COLONOSCOPY    . colonsocopy  12/07/2009   per Dr. [name], benign polyps, repeat in 10 yrs   . CORONARY ARTERY BYPASS GRAFT N/A 11/22/2015   Procedure: CORONARY ARTERY BYPASS  GRAFTING (CABG) times five using the left internal mammary, right greater saphenous vein EVH, and left thigh greater saphenous vein EVH;  Surgeon: [name], MD;  Location: [place];  Service: Open Heart Surgery;  Laterality: N/A;  . frozen shoulder release Right   . INTRAVASCULAR PRESSURE WIRE/FFR STUDY N/A 12/14/2018   Procedure: INTRAVASCULAR PRESSURE WIRE/FFR STUDY;  Surgeon: Jettie Booze, MD;  Location: [facility];  Service: Cardiovascular;  Laterality: N/A;  . LEFT HEART CATH AND CORS/GRAFTS ANGIOGRAPHY N/A 10/27/2017   Procedure: LEFT HEART CATH AND CORS/GRAFTS ANGIOGRAPHY;  Surgeon: [name], MD;  Location: [place] [facility];  Service: Cardiovascular;  Laterality: N/A;  . LEFT HEART CATH AND CORS/GRAFTS ANGIOGRAPHY N/A 12/14/2018   Procedure: LEFT HEART CATH AND CORS/GRAFTS ANGIOGRAPHY;  Surgeon: Jettie Booze, MD;  Location: [place] [facility];  Service: Cardiovascular;  Laterality: N/A;  . TEE WITHOUT CARDIOVERSION N/A 11/22/2015   Procedure: TRANSESOPHAGEAL ECHOCARDIOGRAM (TEE);  Surgeon: [name], MD;  Location: [facility];  Service: Open Heart Surgery;  Laterality: N/A;  . WRIST GANGLION EXCISION Right     FAMILY HISTORY Family History  Problem Relation Age of Onset  . Heart failure Father   . Anuerysm Brother        AAA  . Hyperlipidemia Sister   . Diabetes Sister   . Hypertension Sister   . Kidney failure Sister   . Diabetes Maternal Grandmother   . Hyperlipidemia Sister   . Diabetes Sister   . Colon polyps Sister   . Kidney disease Sister   . Other Brother        COVID 19  . Diabetes Son   . Heart disease Son   . Heart attack Neg Hx   . Colon cancer Neg Hx   . Stomach cancer Neg Hx   . Esophageal cancer Neg Hx         SOCIAL HISTORY: Married, lives in [place] with his wife, Lupita Dawn of 30 plus years.  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  He walks  frequently and stays active on his off days.  His granddaughter dominique who is 40 also lives in his home.  He has 5 kids, ages 75-48.  Two are in [place] and the rest are spread over the [place].  He has 6 grandchildren.  No previous hazardous exposures.     ADVANCED DIRECTIVES: Not in place   HEALTH MAINTENANCE: Social History   Tobacco Use  . Smoking status: Former Smoker    Packs/Zartman: 0.25    Years: 20.00    Pack years: 5.00    Types: Cigarettes    Quit date: 10/07/2013    Years since quitting: 6.3  . Smokeless tobacco: Never  Used  . Tobacco comment: quit cigarettes, might have a cigar 1 X month  Substance Use Topics  . Alcohol use: Yes    Alcohol/week: 3.0 - 4.0 standard drinks    Types: 3 - 4 Standard drinks or equivalent per week  . Drug use: No     Colonoscopy: 2011, scheduled for tomorrow  PSA: 03/2019   No Known Allergies  Current Outpatient Medications  Medication Sig Dispense Refill  . Accu-Chek Softclix Lancets lancets 1 each by Other route daily. Use to monitor glucose levels daily; E11.8 100 each 2  . APPLE CIDER VINEGAR PO Take by mouth.    Marland Kitchen aspirin EC 81 MG tablet Take 1 tablet (81 mg total) by mouth daily.    . benazepril (LOTENSIN) 20 MG tablet Take 1 tablet (20 mg total) by mouth daily. 90 tablet 1  . Blood Glucose Monitoring Suppl (ACCU-CHEK AVIVA PLUS) w/Device KIT 1 each by Does not apply route daily. Use to monitor glucose levels daily; E11.8 1 kit 0  . bromocriptine (PARLODEL) 2.5 MG tablet Take 1 tablet by mouth once daily 90 tablet 0  . carvedilol (COREG) 3.125 MG tablet Take 1 tablet (3.125 mg total) by mouth 2 (two) times daily with a meal. 180 tablet 1  . Cinnamon 500 MG capsule Take 1 capsule by mouth daily.    . Coenzyme Q10 (COQ-10) 100 MG CAPS Take 1 tablet by mouth daily.    . Cyanocobalamin (VITAMIN B-12 PO) Take 1 tablet by mouth daily.    Marland Kitchen ezetimibe (ZETIA) 10 MG tablet Take 1 tablet (10 mg total) by mouth daily. 90 tablet 2  . Garlic  6063 MG CAPS Take 1 tablet by mouth daily.    Marland Kitchen glucose blood (ACCU-CHEK AVIVA PLUS) test strip 1 each by Other route daily. Use to monitor glucose levels daily; E11.8 100 each 2  . metFORMIN (GLUCOPHAGE) 500 MG tablet Take 4 tablets by mouth daily 360 tablet 2  . Multiple Vitamin (MULTIVITAMIN) tablet Take 1 tablet by mouth daily.      . Omega-3 Fatty Acids (FISH OIL) 1000 MG CAPS Take 1,000 mg by mouth daily.     . ranolazine (RANEXA) 500 MG 12 hr tablet Take 1 tablet (500 mg total) by mouth 2 (two) times daily. 180 tablet 3  . repaglinide (PRANDIN) 0.5 MG tablet Take 1 tablet (0.5 mg total) by mouth 2 (two) times daily before a meal. 180 tablet 3  . rosuvastatin (CRESTOR) 40 MG tablet Take 1 tablet (40 mg total) by mouth daily. 90 tablet 3  . sildenafil (VIAGRA) 100 MG tablet TAKE ONE TABLET BY MOUTH DAILY AS NEEDED FOR ERECTILE DYSFUNCTION 30 tablet 0  . acetaminophen (TYLENOL) 500 MG tablet Take 500 mg by mouth as needed.    . ferrous sulfate 325 (65 FE) MG tablet Take 325 mg by mouth daily with breakfast.    . nitroGLYCERIN (NITROSTAT) 0.4 MG SL tablet Place 1 tablet (0.4 mg total) under the tongue every 5 (five) minutes as needed for chest pain. (Patient not taking: Reported on 02/22/2020) 75 tablet 1   No current facility-administered medications for this visit.    OBJECTIVE:  Vitals:   02/22/20 0845  BP: 133/82  Pulse: (!) 59  Resp: 20  Temp: 98.3 F (36.8 C)  SpO2: 100%     Body mass index is 30.92 kg/m.   Wt Readings from Last 3 Encounters:  02/22/20 188 lb 11.2 oz (85.6 kg)  02/15/20 192 lb (87.1 kg)  02/08/20 187 lb (84.8 kg)      ECOG FS:0 - Asymptomatic  GENERAL: Patient is a well appearing male in no acute distress HEENT:  Sclerae anicteric.  Mask in place. Neck is supple.  NODES:  No cervical, supraclavicular, or axillary lymphadenopathy palpated.  LUNGS:  Clear to auscultation bilaterally.  No wheezes or rhonchi. HEART:  Regular rate and rhythm. No murmur  appreciated. ABDOMEN:  Soft, nontender.  Positive, normoactive bowel sounds. No organomegaly palpated. MSK:  No focal spinal tenderness to palpation. Full range of motion bilaterally in the upper extremities. EXTREMITIES:  No peripheral edema.   SKIN:  Clear with no obvious rashes or skin changes. No nail dyscrasia. NEURO:  Nonfocal. Well oriented.  Appropriate affect.   LAB RESULTS:  CMP     Component Value Date/Time   NA 138 02/22/2020 0828   NA 141 10/26/2019 0903   K 4.1 02/22/2020 0828   CL 108 02/22/2020 0828   CO2 24 02/22/2020 0828   GLUCOSE 213 (H) 02/22/2020 0828   BUN 18 02/22/2020 0828   BUN 14 10/26/2019 0903   CREATININE 1.23 02/22/2020 0828   CREATININE 1.19 11/14/2015 1135   CALCIUM 8.8 (L) 02/22/2020 0828   PROT 8.0 02/22/2020 0828   PROT 7.6 10/26/2019 0903   ALBUMIN 3.8 02/22/2020 0828   ALBUMIN 4.4 10/26/2019 0903   AST 34 02/22/2020 0828   ALT 42 02/22/2020 0828   ALKPHOS 33 (L) 02/22/2020 0828   BILITOT 0.5 02/22/2020 0828   GFRNONAA >60 02/22/2020 0828   GFRAA >60 02/22/2020 0828    No results found for: TOTALPROTELP, ALBUMINELP, A1GS, A2GS, BETS, BETA2SER, GAMS, MSPIKE, SPEI  No results found for: Nils Pyle, [facility]  Lab Results  Component Value Date   WBC 3.8 (L) 02/22/2020   NEUTROABS 2.2 02/22/2020   HGB 12.2 (L) 02/22/2020   HCT 37.1 (L) 02/22/2020   MCV 89.6 02/22/2020   PLT 137 (L) 02/22/2020      Chemistry      Component Value Date/Time   NA 138 02/22/2020 0828   NA 141 10/26/2019 0903   K 4.1 02/22/2020 0828   CL 108 02/22/2020 0828   CO2 24 02/22/2020 0828   BUN 18 02/22/2020 0828   BUN 14 10/26/2019 0903   CREATININE 1.23 02/22/2020 0828   CREATININE 1.19 11/14/2015 1135      Component Value Date/Time   CALCIUM 8.8 (L) 02/22/2020 0828   ALKPHOS 33 (L) 02/22/2020 0828   AST 34 02/22/2020 0828   ALT 42 02/22/2020 0828   BILITOT 0.5 02/22/2020 0828       No results found for: LABCA2  No  components found for: VQMGQQ761  No results for input(s): INR in the last 168 hours.  No results found for: LABCA2  No results found for: PJK932  No results found for: IZT245  No results found for: YKD983  No results found for: CA2729  No components found for: HGQUANT  No results found for: CEA1 / No results found for: CEA1   No results found for: AFPTUMOR  No results found for: CHROMOGRNA  No results found for: PSA1  Appointment on 02/22/2020  Component Date Value Ref Range Status  . LDH 02/22/2020 198* 98 - 192 U/L Final   Performed at [facility], [place] [address], [place], [place] [address]  . Sodium 02/22/2020 138  135 - 145 mmol/L Final  . Potassium 02/22/2020 4.1  3.5 - 5.1 mmol/L Final  . Chloride 02/22/2020  108  98 - 111 mmol/L Final  . CO2 02/22/2020 24  22 - 32 mmol/L Final  . Glucose, Bld 02/22/2020 213* 70 - 99 mg/dL Final   Glucose reference range applies only to samples taken after fasting for at least 8 hours.  . BUN 02/22/2020 18  8 - 23 mg/dL Final  . Creatinine 02/22/2020 1.23  0.61 - 1.24 mg/dL Final  . Calcium 02/22/2020 8.8* 8.9 - 10.3 mg/dL Final  . Total Protein 02/22/2020 8.0  6.5 - 8.1 g/dL Final  . Albumin 02/22/2020 3.8  3.5 - 5.0 g/dL Final  . AST 02/22/2020 34  15 - 41 U/L Final  . ALT 02/22/2020 42  0 - 44 U/L Final  . Alkaline Phosphatase 02/22/2020 33* 38 - 126 U/L Final  . Total Bilirubin 02/22/2020 0.5  0.3 - 1.2 mg/dL Final  . GFR, Est Non Af Am 02/22/2020 >60  >60 mL/min Final  . GFR, Est AFR Am 02/22/2020 >60  >60 mL/min Final  . Anion gap 02/22/2020 6  5 - 15 Final   Performed at [facility], [place] [address] [place], [place] [address]  . Smear Review 02/22/2020 SMEAR STAINED AND AVAILABLE FOR REVIEW   Final   Performed at [facility], [address] [place], [place] [address]  . Retic Ct Pct 02/22/2020 1.3  0.4 - 3.1 % Final  . RBC. 02/22/2020 4.08*  4.22 - 5.81 MIL/uL Final  . Retic Count, Absolute 02/22/2020 54.7  19.0 - 186.0 K/uL Final  . Immature Retic Fract 02/22/2020 14.0  2.3 - 15.9 % Final   Performed at [facility], [place] [address] [place], [place] [address]  . WBC Count 02/22/2020 3.8* 4.0 - 10.5 K/uL Final  . RBC 02/22/2020 4.14* 4.22 - 5.81 MIL/uL Final  . Hemoglobin 02/22/2020 12.2* 13.0 - 17.0 g/dL Final  . HCT 02/22/2020 37.1* 39.0 - 52.0 % Final  . MCV 02/22/2020 89.6  80.0 - 100.0 fL Final  . MCH 02/22/2020 29.5  26.0 - 34.0 pg Final  . MCHC 02/22/2020 32.9  30.0 - 36.0 g/dL Final  . RDW 02/22/2020 12.8  11.5 - 15.5 % Final  . Platelet Count 02/22/2020 137* 150 - 400 K/uL Final  . nRBC 02/22/2020 0.0  0.0 - 0.2 % Final  . Neutrophils Relative % 02/22/2020 57  % Final  . Neutro Abs 02/22/2020 2.2  1.7 - 7.7 K/uL Final  . Lymphocytes Relative 02/22/2020 30  % Final  . Lymphs Abs 02/22/2020 1.2  0.7 - 4.0 K/uL Final  . Monocytes Relative 02/22/2020 11  % Final  . Monocytes Absolute 02/22/2020 0.4  0.1 - 1.0 K/uL Final  . Eosinophils Relative 02/22/2020 1  % Final  . Eosinophils Absolute 02/22/2020 0.0  0.0 - 0.5 K/uL Final  . Basophils Relative 02/22/2020 1  % Final  . Basophils Absolute 02/22/2020 0.0  0.0 - 0.1 K/uL Final  . Immature Granulocytes 02/22/2020 0  % Final  . Abs Immature Granulocytes 02/22/2020 0.01  0.00 - 0.07 K/uL Final   Performed at [facility], [place] [address] [place], [place] [address]    (this displays the last labs from the last 3 days)  No results found for: TOTALPROTELP, ALBUMINELP, A1GS, A2GS, BETS, BETA2SER, GAMS, MSPIKE, SPEI (this displays SPEP labs)  No results found for: KPAFRELGTCHN, LAMBDASER, KAPLAMBRATIO (kappa/lambda light chains)  No results found for: HGBA, HGBA2QUANT, HGBFQUANT, HGBSQUAN (Hemoglobinopathy evaluation)  Lab Results  Component Value Date   LDH 198 (H) 02/22/2020    Lab Results  Component Value Date    IRON 99 01/24/2020   IRON 104 01/24/2020   TIBC 418 01/24/2020   IRONPCTSAT 20.1 01/24/2020   IRONPCTSAT 25 01/24/2020   (Iron and TIBC)  Lab Results  Component Value Date   FERRITIN 12.4 (L) 01/24/2020    Urinalysis    Component Value Date/Time   COLORURINE YELLOW 05/19/2013 2205   APPEARANCEUR CLEAR 05/19/2013 2205   LABSPEC 1.034 (H) 05/19/2013 2205   PHURINE 6.5 05/19/2013 2205   GLUCOSEU >1000 (A) 05/19/2013 2205   HGBUR NEGATIVE 05/19/2013 2205   BILIRUBINUR neg 04/11/2019 1007   KETONESUR NEGATIVE 05/19/2013 2205   PROTEINUR Positive (A) 04/11/2019 1007   PROTEINUR NEGATIVE 05/19/2013 2205   UROBILINOGEN 0.2 04/11/2019 1007   UROBILINOGEN 1.0 05/19/2013 2205   NITRITE neg 04/11/2019 1007   NITRITE NEGATIVE 05/19/2013 2205   LEUKOCYTESUR Negative 04/11/2019 1007     STUDIES: US Abdomen Complete  Result Date: 02/21/2020 CLINICAL DATA:  69 year old male with pancytopenia. Query liver and spleen. EXAM: ABDOMEN ULTRASOUND COMPLETE COMPARISON:  CTA chest 317. Right upper quadrant ultrasound 05/20/2013. FINDINGS: Gallbladder: No gallstones or wall thickening visualized. No sonographic Murphy sign noted by sonographer. Common bile duct: Diameter: 4 mm, normal. Liver: No focal lesion identified. Within normal limits in parenchymal echogenicity. Portal vein is patent on color Doppler imaging with normal direction of blood flow towards the liver. IVC: No abnormality visualized. Pancreas: Visualized portion unremarkable. Spleen: Up to 8.7 cm in length, diminutive. Estimated total splenic volume 88 mL (normal splenic volume range 83 - 412 mL). Right Kidney: Length: 10.9 cm. Chronic large simple appearing right renal cyst, 7.9 cm (image 70), partially visible in 2017. There is a 2nd smaller simple appearing 1.5 cm cyst. No right hydronephrosis or solid right renal mass. Left Kidney: Length: 10.9 cm. Echogenicity within normal limits. No mass or hydronephrosis visualized. Abdominal  aorta: No aneurysm visualized. Other findings: None. IMPRESSION: 1. No liver or splenic lesion by ultrasound. The spleen is diminutive. 2. No acute findings in the abdomen. Chronic benign right renal cysts. Electronically Signed   By: Genevie Ann M.D.   On: 02/21/2020 10:56      ASSESSMENT: 68 y.o. Jerome Vaughn man here today at the request of Dr. Tarri Glenn for consultation regarding pancytopenia.  1. Leukopenia  (a)Lower limit of normal WBC for African Americans is 3.5  (b) likely slightly lower in the past secondary to transient viral infection  2. Anemia  (a) iron deficiency related to GI bleeding  (b) continue oral iron  3. 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  PLAN:  Kaci met with myself and Dr. Jana Hakim after reviewing his blood film.  We reviewed with Jerome Vaughn that his blood cells are formed in the marrow of his bone. There are three main types of blood cells which include:  1. White blood cells: immune cells.  His have only been slightly low, and that is likely secondary to a viral infection, or medication.  Today his WBC is 3.8 and the lower limit of normal WBC for an African American is actually 3.5 and not 4.0.  His WBCs appear normal under the microscope and there is no concern for him having a leukemia.    2. Platelets: clotting cells.  His have been consistently between 130 and 140.  His platelets, though slightly  low in number, appear normal under the microscope.  He is not at risk for bleeding unless his plt count is below 50.  This mild decrease is likely secondary to medications.    3. Red blood cells: oxygen carrying cells.  His red blood cells are decreased.  This is a condition called anemia.  Patients typically do not experience symptoms of anemia until there hemoglobin drops below 10.  His is above 12.  His ferritin has been decreased, and we recommended that he continue oral iron  supplementation and f/u with GI regarding an upper endoscopy and colonoscopy.  We will f/u on his iron studies, and have added additional testing such as b12, folate, infectious testing, and inflammatory markers to rule out any other etiology, and also so that we can be sure his anemia will reverse with oral iron.    After reviewing the above with Jerome Vaughn, he was relieved and happy to hear that he does not have a primary hematologic or bone marrow problem.  He will f/u with GI, and we recommend for him to have his labs followed by PCP.  We will call him once all of his labs result.  We do not need to see him on a regular basis, however are happy to see him at any point in the future, should the need arise.    Total encounter time: 65 minutes*  Wilber Bihari, NP 02/22/20 10:01 AM Medical Oncology and Hematology [facility] [place], [place] [address] Tel. [phone]    Fax. [phone]   ADDENDUM: 69 y/o [place] man presenting with bright red blood per rectum, with negative GI work-up, and tolerating oral iron supplementation well, but with intermittent leukopenia and mild persistent thrombocytopenia.  REVIEW OF BLOOD FILM: Shows no significant abnormalities in the red cells and in particular no tailed poikilocytes, no nucleated red blood cells.  The white cell series shows no left shift, no hypersegmentation in the polys, and no dysplasia.  Platelets appear adequate.  We discussed the fact that the lower limit of normal is different for African-Americans, namely 3.5 versus 4.04 whites.  Nevertheless he has had 2 brief readings between 3.0 and 3.5.  It is not clear what was going on concurrently.  A viral infection can briefly lower the white cell count for example.  Similarly many medications can affect bone marrow function.  The thrombocytopenia is longstanding, not progressive, and not caused by artifactual clumping or splenomegaly.  It is very close to the lower  limit of normal.  I do not think it puts him at any risk and I would not proceed with any further evaluation.  Since we started this note we have received the final results of his iron studies which are really no better than they were a month ago despite his taking iron orally.  I called Jerome Vaughn and discussed iron infusions.  He is willing to proceed.  He would like to receive the first 1 on June 3 if possible and I will make the appropriate arrangements for that.  He would receive a second 1 a week after the first 1.  We will set him up for repeat labs 3 months from now just to review iron studies and review the blood film and total counts.  I personally saw this patient and performed a substantive portion of this encounter with the listed APP documented above.   Chauncey Cruel, MD Medical Oncology and Hematology [facility] [address]  [place],  [address] Tel. [phone]    Fax. [phone]   *Total Encounter Time as defined by the Centers for Medicare and Medicaid Services includes, in addition to the face-to-face time of a patient visit (documented in the note above) non-face-to-face time: obtaining and reviewing outside history, ordering and reviewing medications, tests or procedures, care coordination (communications with other health care professionals or caregivers) and documentation in the medical record.

## 2020-02-22 NOTE — Progress Notes (Signed)
Endoscopic evaluation planned for 02/23/20.

## 2020-02-23 ENCOUNTER — Telehealth: Payer: Self-pay | Admitting: Adult Health

## 2020-02-23 ENCOUNTER — Other Ambulatory Visit: Payer: Self-pay | Admitting: Oncology

## 2020-02-23 ENCOUNTER — Encounter: Payer: Self-pay | Admitting: Gastroenterology

## 2020-02-23 ENCOUNTER — Ambulatory Visit (AMBULATORY_SURGERY_CENTER): Payer: Medicare HMO | Admitting: Gastroenterology

## 2020-02-23 VITALS — BP 105/66 | HR 60 | Temp 97.8°F | Resp 13

## 2020-02-23 DIAGNOSIS — K5730 Diverticulosis of large intestine without perforation or abscess without bleeding: Secondary | ICD-10-CM | POA: Diagnosis not present

## 2020-02-23 DIAGNOSIS — K2950 Unspecified chronic gastritis without bleeding: Secondary | ICD-10-CM

## 2020-02-23 DIAGNOSIS — K219 Gastro-esophageal reflux disease without esophagitis: Secondary | ICD-10-CM

## 2020-02-23 DIAGNOSIS — K2980 Duodenitis without bleeding: Secondary | ICD-10-CM | POA: Diagnosis not present

## 2020-02-23 DIAGNOSIS — D124 Benign neoplasm of descending colon: Secondary | ICD-10-CM | POA: Diagnosis not present

## 2020-02-23 DIAGNOSIS — K649 Unspecified hemorrhoids: Secondary | ICD-10-CM

## 2020-02-23 DIAGNOSIS — D123 Benign neoplasm of transverse colon: Secondary | ICD-10-CM | POA: Diagnosis not present

## 2020-02-23 DIAGNOSIS — D120 Benign neoplasm of cecum: Secondary | ICD-10-CM | POA: Diagnosis not present

## 2020-02-23 DIAGNOSIS — D122 Benign neoplasm of ascending colon: Secondary | ICD-10-CM | POA: Diagnosis not present

## 2020-02-23 DIAGNOSIS — K2970 Gastritis, unspecified, without bleeding: Secondary | ICD-10-CM

## 2020-02-23 DIAGNOSIS — K625 Hemorrhage of anus and rectum: Secondary | ICD-10-CM | POA: Diagnosis not present

## 2020-02-23 DIAGNOSIS — Z86010 Personal history of colonic polyps: Secondary | ICD-10-CM

## 2020-02-23 DIAGNOSIS — K319 Disease of stomach and duodenum, unspecified: Secondary | ICD-10-CM | POA: Diagnosis not present

## 2020-02-23 HISTORY — PX: COLONOSCOPY: SHX174

## 2020-02-23 MED ORDER — PANTOPRAZOLE SODIUM 40 MG PO TBEC
40.0000 mg | DELAYED_RELEASE_TABLET | Freq: Two times a day (BID) | ORAL | 3 refills | Status: DC
Start: 2020-02-23 — End: 2020-08-02

## 2020-02-23 MED ORDER — SODIUM CHLORIDE 0.9 % IV SOLN: 500.0000 mL | Freq: Once | INTRAVENOUS | Status: DC

## 2020-02-23 NOTE — Progress Notes (Signed)
13:25 Pt's states no medical or surgical changes since previsit or office visit. Maw

## 2020-02-23 NOTE — Progress Notes (Signed)
To PACU, VSS. Report to [email or web address] 

## 2020-02-23 NOTE — Patient Instructions (Addendum)
Handouts given: polyps, diverticulosis, hemorrhoids, high fiber diet Continue current medications Await pathology results Start High Fiber diet Start using anusol HC 2.5% apply sparingly to your rectum two times daily Sitz bath daily to provide relief Do NOt take aspirin, ibuprofen, naproxen or other non - steroidal anti inflammatory drugs  YOU HAD AN ENDOSCOPIC PROCEDURE TODAY AT [facility]:   Refer to the procedure report that was given to you for any specific questions about what was found during the examination.  If the procedure report does not answer your questions, please call your gastroenterologist to clarify.  If you requested that your care partner not be given the details of your procedure findings, then the procedure report has been included in a sealed envelope for you to review at your convenience later.  YOU SHOULD EXPECT: Some feelings of bloating in the abdomen. Passage of more gas than usual.  Walking can help get rid of the air that was put into your GI tract during the procedure and reduce the bloating. If you had a lower endoscopy (such as a colonoscopy or flexible sigmoidoscopy) you may notice spotting of blood in your stool or on the toilet paper. If you underwent a bowel prep for your procedure, you may not have a normal bowel movement for a few days.  Please Note:  You might notice some irritation and congestion in your nose or some drainage.  This is from the oxygen used during your procedure.  There is no need for concern and it should clear up in a [place] or so.  SYMPTOMS TO REPORT IMMEDIATELY:   Following lower endoscopy (colonoscopy or flexible sigmoidoscopy):  Excessive amounts of blood in the stool  Significant tenderness or worsening of abdominal pains  Swelling of the abdomen that is new, acute  Fever of 100F or higher   Following upper endoscopy (EGD)  Vomiting of blood or coffee ground material  New chest pain or pain under the shoulder  blades  Painful or persistently difficult swallowing  New shortness of breath  Fever of 100F or higher  Black, tarry-looking stools  For urgent or emergent issues, a gastroenterologist can be reached at any hour by calling [phone]. Do not use MyChart messaging for urgent concerns.    DIET:  We do recommend a small meal at first, but then you may proceed to your regular diet.  Drink plenty of fluids but you should avoid alcoholic beverages for 24 hours.  ACTIVITY:  You should plan to take it easy for the rest of today and you should NOT DRIVE or use heavy machinery until tomorrow (because of the sedation medicines used during the test).    FOLLOW UP: Our staff will call the number listed on your records 48-72 hours following your procedure to check on you and address any questions or concerns that you may have regarding the information given to you following your procedure. If we do not reach you, we will leave a message.  We will attempt to reach you two times.  During this call, we will ask if you have developed any symptoms of COVID 19. If you develop any symptoms (ie: fever, flu-like symptoms, shortness of breath, cough etc.) before then, please call [phone].  If you test positive for Covid 19 in the 2 weeks post procedure, please call and report this information to [place].    If any biopsies were taken you will be contacted by phone or by letter within the next 1-3 weeks.  Please call us at [phone] if you have not heard about the biopsies in 3 weeks.    SIGNATURES/CONFIDENTIALITY: You and/or your care partner have signed paperwork which will be entered into your electronic medical record.  These signatures attest to the fact that that the information above on your After Visit Summary has been reviewed and is understood.  Full responsibility of the confidentiality of this discharge information lies with you and/or your care-partner.

## 2020-02-23 NOTE — Telephone Encounter (Signed)
No 5/26 los. No changes made to pt's schedule.  

## 2020-02-24 ENCOUNTER — Telehealth: Payer: Self-pay | Admitting: Oncology

## 2020-02-24 ENCOUNTER — Other Ambulatory Visit: Payer: Self-pay

## 2020-02-24 DIAGNOSIS — K644 Residual hemorrhoidal skin tags: Secondary | ICD-10-CM

## 2020-02-24 LAB — PROTEIN ELECTROPHORESIS, SERUM, WITH REFLEX
A/G Ratio: 1.1 (ref 0.7–1.7)
Albumin ELP: 3.9 g/dL (ref 2.9–4.4)
Alpha-1-Globulin: 0.2 g/dL (ref 0.0–0.4)
Alpha-2-Globulin: 0.8 g/dL (ref 0.4–1.0)
Beta Globulin: 1 g/dL (ref 0.7–1.3)
Gamma Globulin: 1.7 g/dL (ref 0.4–1.8)
Globulin, Total: 3.6 g/dL (ref 2.2–3.9)
M-Spike, %: 1.2 g/dL — ABNORMAL HIGH
SPEP Interpretation: 0
Total Protein ELP: 7.5 g/dL (ref 6.0–8.5)

## 2020-02-24 LAB — IMMUNOFIXATION REFLEX, SERUM
IgA: 122 mg/dL (ref 61–437)
IgG (Immunoglobin G), Serum: 1951 mg/dL — ABNORMAL HIGH (ref 603–1613)
IgM (Immunoglobulin M), Srm: 23 mg/dL (ref 20–172)

## 2020-02-24 NOTE — Progress Notes (Unsigned)
Left message for patient to call back with preferred local pharmacy for RX of Anusol 2.5 % cream.  Pt did not return call regarding preferred local pharmacy so that RX for Anusol can be sent in. Lm on vm for patient to return call with information on 02/28/2020.

## 2020-02-24 NOTE — Telephone Encounter (Signed)
Scheduled appt per 5/28 sch message - unable to reach pt . Left message with appt date and time   

## 2020-02-27 NOTE — Op Note (Signed)
[name] Procedure Date: 02/23/2020 1:53 PM MRN: [id number] Endoscopist: Thornton Park MD, MD Age: 69 Referring MD:  Date of Birth: March 16, 1951 Gender: Male Account #: [id number] Procedure:                Colonoscopy Indications:              Rectal bleeding                           Colonoscopy 2011 with Dr. [name]: 2 tubular                            adenomas Medicines:                Monitored Anesthesia Care Procedure:                Pre-Anesthesia Assessment:                           - Prior to the procedure, a History and Physical                            was performed, and patient medications and                            allergies were reviewed. The patient's tolerance of                            previous anesthesia was also reviewed. The risks                            and benefits of the procedure and the sedation                            options and risks were discussed with the patient.                            All questions were answered, and informed consent                            was obtained. Prior Anticoagulants: The patient has                            taken no previous anticoagulant or antiplatelet                            agents. ASA Grade Assessment: III - A patient with                            severe systemic disease. After reviewing the risks                            and benefits, the patient was deemed in  satisfactory condition to undergo the procedure.                           After obtaining informed consent, the colonoscope                            was passed under direct vision. Throughout the                            procedure, the patient's blood pressure, pulse, and                            oxygen saturations were monitored continuously. The                            Colonoscope was introduced through the anus and                            advanced to  the 3 cm into the ileum. A second                            forward view of the right colon was performed. The                            colonoscopy was performed without difficulty. The                            patient tolerated the procedure well. The quality                            of the bowel preparation was good. The terminal                            ileum, ileocecal valve, appendiceal orifice, and                            rectum were photographed. Scope In: 1:55:10 PM Scope Out: 2:14:16 PM Scope Withdrawal Time: 0 hours 16 minutes 58 seconds  Total Procedure Duration: 0 hours 19 minutes 6 seconds  Findings:                 Non-bleeding external and internal hemorrhoids were                            found. The hemorrhoids were medium-sized.                           Four flat polyps were found in the transverse                            colon, hepatic flexure, ascending colon and cecum.                            The polyps were less than 50mm to 1 mm in  size.                            These polyps were removed with a cold biopsy                            forceps. Resection and retrieval were complete.                            Estimated blood loss was minimal.                           A 2 mm polyp was found in the descending colon and                            a 21mm polyp was found at the hepatic flexure. The                            polyps were sessile. The polyps were removed with a                            cold snare. Resection and retrieval were complete.                            Estimated blood loss was minimal.                           Multiple small and large-mouthed diverticula were                            found in the entire colon.                           The exam was otherwise without abnormality on                            direct and retroflexion views. Complications:            No immediate complications. Estimated blood loss:                             Minimal. Estimated Blood Loss:     Estimated blood loss was minimal. Impression:               - Non-bleeding external and internal hemorrhoids.                           - Four 1 mm polyps in the transverse colon, at the                            hepatic flexure, in the ascending colon and in the                            cecum, removed with a cold biopsy forceps. Resected  and retrieved.                           - Two 2 mm polyp in the descending colon and                            hepatic flexure, removed with a cold snare.                            Resected and retrieved.                           - Diverticulosis in the entire examined colon.                           - The examination was otherwise normal on direct                            and retroflexion views. Recommendation:           - Patient has a contact number available for                            emergencies. The signs and symptoms of potential                            delayed complications were discussed with the                            patient. Return to normal activities tomorrow.                            Written discharge instructions were provided to the                            patient.                           - Follow a high fiber diet. Drink at least 64                            ounces of water daily. Add a daily stool bulking                            agent such as psyllium (an exampled would be                            Metamucil).                           - Continue present medications.                           - Await pathology results.                           -  Repeat colonoscopy date to be determined after                            pending pathology results are reviewed for                            surveillance.                           - Start using Anusol HC 2.5% applied sparingly to                            your rectum twice  daily.                           - Sitz baths may provide some additional relief.                           - If you would like more information,                            MyGIHealth.com and UpToDate.com have good                            information about hemorrhoids.                           - Emerging evidence supports eating a diet of                            fruits, vegetables, grains, calcium, and yogurt                            while reducing red meat and alcohol may reduce the                            risk of colon cancer.                           - Thank you for allowing me to be involved in your                            colon cancer prevention. Thornton Park MD, MD 02/23/2020 2:27:48 PM This report has been signed electronically.

## 2020-02-27 NOTE — Op Note (Addendum)
[place] Patient Name: Jerome Vaughn Procedure Date: 02/23/2020 1:33 PM MRN: [id number] Endoscopist: Thornton Park MD, MD Age: 69 Referring MD:  Date of Birth: September 28, 1951 Gender: Male Account #: [id number] Procedure:                Upper GI endoscopy Indications:              Esophageal reflux symptoms that persist despite                            appropriate therapy Medicines:                Monitored Anesthesia Care Procedure:                Pre-Anesthesia Assessment:                           - Prior to the procedure, a History and Physical                            was performed, and patient medications and                            allergies were reviewed. The patient's tolerance of                            previous anesthesia was also reviewed. The risks                            and benefits of the procedure and the sedation                            options and risks were discussed with the patient.                            All questions were answered, and informed consent                            was obtained. Prior Anticoagulants: The patient has                            taken no previous anticoagulant or antiplatelet                            agents. ASA Grade Assessment: III - A patient with                            severe systemic disease. After reviewing the risks                            and benefits, the patient was deemed in                            satisfactory condition to undergo the procedure.  After obtaining informed consent, the endoscope was                            passed under direct vision. Throughout the                            procedure, the patient's blood pressure, pulse, and                            oxygen saturations were monitored continuously. The                            Endoscope was introduced through the mouth, and                            advanced to the third part of  duodenum. The upper                            GI endoscopy was accomplished without difficulty.                            The patient tolerated the procedure well. Scope In: Scope Out: Findings:                 LA Grade A (one or more mucosal breaks less than 5                            mm, not extending between tops of 2 mucosal folds)                            esophagitis with no bleeding was found. Biopsies                            were taken with a cold forceps for histology.                            Estimated blood loss was minimal.                           Diffuse minimal inflammation characterized by                            erythema, friability and granularity was found in                            the gastric body. Biopsies were taken from the                            antrum, body, and fundus with a cold forceps for                            histology. Estimated blood loss was minimal.  Diffuse mildly erythematous mucosa was found in the                            duodenal bulb. Biopsies were taken with a cold                            forceps for histology. Estimated blood loss was                            minimal.                           The cardia and gastric fundus were normal on                            retroflexion.                           The exam was otherwise without abnormality. Complications:            No immediate complications. Estimated blood loss:                            Minimal. Estimated Blood Loss:     Estimated blood loss was minimal. Impression:               - LA Grade A esophagitis with no bleeding. Biopsied.                           - Gastritis. Biopsied.                           - Erythematous duodenopathy. Biopsied.                           - The examination was otherwise normal. Recommendation:           - Patient has a contact number available for                            emergencies. The signs  and symptoms of potential                            delayed complications were discussed with the                            patient. Return to normal activities tomorrow.                            Written discharge instructions were provided to the                            patient.                           - Resume previous diet.                           -  Continue present medications.                           - Start pantoprazole 40 mg BID x 12 weeks.                           - No aspirin, ibuprofen, naproxen, or other                            non-steroidal anti-inflammatory drugs.                           - Await pathology results.                           - Follow-up in 8-10 weeks, earlier if needed. Thornton Park MD, MD 02/23/2020 2:21:55 PM This report has been signed electronically.

## 2020-02-28 ENCOUNTER — Other Ambulatory Visit: Payer: Self-pay | Admitting: Oncology

## 2020-02-28 ENCOUNTER — Other Ambulatory Visit: Payer: Self-pay

## 2020-02-28 ENCOUNTER — Telehealth: Payer: Self-pay

## 2020-02-28 ENCOUNTER — Telehealth: Payer: Self-pay | Admitting: Gastroenterology

## 2020-02-28 DIAGNOSIS — K625 Hemorrhage of anus and rectum: Secondary | ICD-10-CM

## 2020-02-28 MED ORDER — HYDROCORTISONE (PERIANAL) 2.5 % EX CREA: 1.0000 "application " | TOPICAL_CREAM | Freq: Two times a day (BID) | CUTANEOUS | 1 refills | Status: DC

## 2020-02-28 NOTE — Telephone Encounter (Signed)
Noted  

## 2020-02-28 NOTE — Telephone Encounter (Signed)
First post procedure follow up call, no answer 

## 2020-02-28 NOTE — Telephone Encounter (Signed)
Patient returned the call states he is doing very well

## 2020-02-29 ENCOUNTER — Telehealth: Payer: Self-pay | Admitting: Gastroenterology

## 2020-02-29 ENCOUNTER — Other Ambulatory Visit: Payer: Self-pay

## 2020-02-29 ENCOUNTER — Telehealth: Payer: Self-pay | Admitting: *Deleted

## 2020-02-29 DIAGNOSIS — A048 Other specified bacterial intestinal infections: Secondary | ICD-10-CM

## 2020-02-29 MED ORDER — METRONIDAZOLE 500 MG PO TABS: 500.0000 mg | ORAL_TABLET | Freq: Four times a day (QID) | ORAL | 0 refills | Status: AC

## 2020-02-29 MED ORDER — BISMUTH SUBSALICYLATE 262 MG PO TABS: 262.0000 mg | ORAL_TABLET | Freq: Four times a day (QID) | ORAL | 0 refills | Status: DC

## 2020-02-29 MED ORDER — TETRACYCLINE HCL 500 MG PO CAPS
500.0000 mg | ORAL_CAPSULE | Freq: Four times a day (QID) | ORAL | 0 refills | Status: AC
Start: 2020-02-29 — End: 2020-03-14

## 2020-02-29 NOTE — Telephone Encounter (Signed)
Please find out their preferred treatment for H pylori. None of the alternatives are considered treatments for H pylori. Thank you.

## 2020-02-29 NOTE — Telephone Encounter (Signed)
I think this was meant for you

## 2020-02-29 NOTE — Telephone Encounter (Signed)
Patient called states pharmacy told him he needs prior auth from the doctor.

## 2020-02-29 NOTE — Telephone Encounter (Signed)
Dr Tarri Glenn,  This patient's EGD is not visible under procedures.  Not sure if it's something you need to do in [place]?

## 2020-02-29 NOTE — Telephone Encounter (Signed)
Attempted to initiate prior authorization for patient's tetracycline. [employer] states they will not approve [place] before patient has tried and failed all preferred medications. Alternate medications include demeclocycline, doxycycline, tigecycline and multiple IV medications we do not prescribe for H. Pylori. Please advise if you want patient switched to another preferred alternative. Also pharmacy faxed a rejection for Anusol cream as well. [employer] will not cover at all. Please advise Dr. Tarri Glenn.

## 2020-02-29 NOTE — Telephone Encounter (Signed)
Please see prescriptions that were sent earlier today for H. Pylori treatment, needing prior authorization. Thank you!

## 2020-02-29 NOTE — Telephone Encounter (Signed)
This is strange. I do not see anything in [place].

## 2020-03-01 ENCOUNTER — Inpatient Hospital Stay (HOSPITAL_BASED_OUTPATIENT_CLINIC_OR_DEPARTMENT_OTHER): Payer: Medicare HMO | Admitting: Oncology

## 2020-03-01 ENCOUNTER — Inpatient Hospital Stay: Payer: Medicare HMO | Attending: Adult Health

## 2020-03-01 ENCOUNTER — Other Ambulatory Visit: Payer: Self-pay | Admitting: Interventional Cardiology

## 2020-03-01 ENCOUNTER — Encounter: Payer: Self-pay | Admitting: Oncology

## 2020-03-01 ENCOUNTER — Other Ambulatory Visit: Payer: Self-pay

## 2020-03-01 VITALS — BP 138/86 | HR 61 | Temp 97.9°F | Resp 17

## 2020-03-01 DIAGNOSIS — D472 Monoclonal gammopathy: Secondary | ICD-10-CM

## 2020-03-01 DIAGNOSIS — D509 Iron deficiency anemia, unspecified: Secondary | ICD-10-CM | POA: Insufficient documentation

## 2020-03-01 DIAGNOSIS — Z79899 Other long term (current) drug therapy: Secondary | ICD-10-CM | POA: Diagnosis not present

## 2020-03-01 DIAGNOSIS — A048 Other specified bacterial intestinal infections: Secondary | ICD-10-CM

## 2020-03-01 DIAGNOSIS — E118 Type 2 diabetes mellitus with unspecified complications: Secondary | ICD-10-CM | POA: Diagnosis not present

## 2020-03-01 DIAGNOSIS — D61818 Other pancytopenia: Secondary | ICD-10-CM | POA: Insufficient documentation

## 2020-03-01 DIAGNOSIS — D508 Other iron deficiency anemias: Secondary | ICD-10-CM

## 2020-03-01 MED ORDER — ACETAMINOPHEN 325 MG PO TABS
ORAL_TABLET | ORAL | Status: AC
  Filled 2020-03-01: qty 2

## 2020-03-01 MED ORDER — DOXYCYCLINE HYCLATE 100 MG PO CAPS: 100.0000 mg | ORAL_CAPSULE | Freq: Two times a day (BID) | ORAL | 0 refills | Status: AC

## 2020-03-01 MED ORDER — DIPHENHYDRAMINE HCL 25 MG PO CAPS
25.0000 mg | ORAL_CAPSULE | Freq: Once | ORAL | Status: AC
  Administered 2020-03-01: 25 mg via ORAL

## 2020-03-01 MED ORDER — DIPHENHYDRAMINE HCL 25 MG PO CAPS
ORAL_CAPSULE | ORAL | Status: AC
  Filled 2020-03-01: qty 1

## 2020-03-01 MED ORDER — ACETAMINOPHEN 325 MG PO TABS
650.0000 mg | ORAL_TABLET | Freq: Once | ORAL | Status: AC
  Administered 2020-03-01: 650 mg via ORAL

## 2020-03-01 MED ORDER — SODIUM CHLORIDE 0.9 % IV SOLN
Freq: Once | INTRAVENOUS | Status: AC
  Filled 2020-03-01: qty 250

## 2020-03-01 MED ORDER — BISMUTH SUBSALICYLATE 262 MG PO TABS: 262.0000 mg | ORAL_TABLET | Freq: Four times a day (QID) | ORAL | 0 refills | Status: AC

## 2020-03-01 MED ORDER — SODIUM CHLORIDE 0.9 % IV SOLN
510.0000 mg | Freq: Once | INTRAVENOUS | Status: AC
  Administered 2020-03-01: 510 mg via INTRAVENOUS
  Filled 2020-03-01: qty 510

## 2020-03-01 NOTE — Patient Instructions (Signed)

## 2020-03-01 NOTE — Progress Notes (Signed)
[place]  [phone] [phone]     ID: Jerome Vaughn DOB: 02-Sep-1951  MR#: [id number]  DGU#:[id number]  Patient Care Team: [name], MD as PCP - General (Family Medicine) [name], MD as PCP - Cardiology (Cardiology) Earnie Larsson, [facility] as Pharmacist (Pharmacist) Thornton Park, MD as Consulting Physician (Gastroenterology) Jesson Foskey, Virgie Dad, MD as Consulting Physician (Oncology) [name], MD as Consulting Physician (Endocrinology) Chauncey Cruel, MD OTHER MD:  CHIEF COMPLAINT: Iron deficiency anemia; MGUS  CURRENT TREATMENT: Feraheme  INTERVAL HISTORY: After his last visit here we received additional results from the lab work obtained that [place].  This showed a very low ferritin and a very low iron saturation, meaning that despite Mr. Days taking oral iron daily his iron deficiency persisted.  I called him and suggested he received Feraheme.  He was agreeable and he is here today for the first of 2 infusions.  In addition the screening labs for his cytopenias showed an MGUS; that is discussed further below.   REVIEW OF SYSTEMS: Mr. Lamphier is feeling well and is ready to receive his first Feraheme today.  Since the last visit here he also was found to have H. pylori and he is being treated to eradicate that.  He is tolerating that treatment well also.   HISTORY OF CURRENT ILLNESS: From the original intake note:  [name] is here for evaluation of pancytopenia that was first noted about 4 weeks ago.  He says that from April to May he noted bright red bleeding from his rectum.  He says that this was not just blood on toilet paper, but rather, in the bowl and dripping.  He denies constipation during this time period.  This was accompanied by GI growling and indigestion.  Due to this he changed around his diet and started taking Pepcid.  He also got into see his PCP and get a GI referral which took place on 01/24/2020 when the  pancytopenia was noted.  An abdominal ultrasound was performed on 02/21/2020 which showed no enlarged spleen, or liver issues.    His CBC has been as follows:  Results for Jerome Vaughn (MRN [id number]) as of 02/22/2020 10:07  Ref. Range 10/15/2017 12:38 04/08/2018 08:51 12/12/2018 19:08 04/11/2019 09:24 10/26/2019 09:03 01/24/2020 12:44  WBC Latest Ref Range: 4.0 - 10.5 K/uL 4.7 4.0 6.5 3.1 (L) 4.2 3.2 (L)  RBC Latest Ref Range: 4.22 - 5.81 Mil/uL 4.59 4.67 4.40 3.91 (L) 4.11 (L) 4.03 (L)  Hemoglobin Latest Ref Range: 13.0 - 17.0 g/dL 13.3 14.2 13.5 12.1 (L) 12.8 (L) 12.3 (L)  HCT Latest Ref Range: 39.0 - 52.0 % 40.6 41.7 41.6 35.3 (L) 37.6 36.3 (L)  MCV Latest Ref Range: 78.0 - 100.0 fl 89 89.3 94.5 90.4 92 89.9  MCH Latest Ref Range: 26.6 - 33.0 pg 29.0  30.7  31.1   MCHC Latest Ref Range: 30.0 - 36.0 g/dL 32.8 34.1 32.5 34.2 34.0 33.9  RDW Latest Ref Range: 11.5 - 15.5 % 14.4 13.7 13.0 13.9 12.4 13.4  Platelets Latest Ref Range: 150.0 - 400.0 K/uL 143 (L) 136.0 (L) 125 (L) 130.0 (L) 140 (L) 130.0 (L)      The patient's subsequent history is as detailed below.    PAST MEDICAL HISTORY: Past Medical History:  Diagnosis Date  . Carpal tunnel syndrome    both hands  . Colon polyps   . Coronary artery disease    a. Cath 11/21/2015: Multivessel CAD -->  CABG recommended. b. CABG on 11/22/2015:  LIMA-LAD, SVG-Diag, SVG-OM1 and distal Cx, and SVG-PDA.  . Diabetes mellitus   . GERD (gastroesophageal reflux disease)   . History of echocardiogram    a. Echo 4/17: Moderate LVH, EF 50-55%, mild LAE, no pericardial effusion  . Hyperlipidemia   . Hypertension   . Pneumonia     PAST SURGICAL HISTORY: Past Surgical History:  Procedure Laterality Date  . CARDIAC CATHETERIZATION N/A 11/21/2015   Procedure: Left Heart Cath and Coronary Angiography;  Surgeon: [name], MD;  Location: [place] [facility];  Service: Cardiovascular;  Laterality: N/A;  . COLONOSCOPY    . colonsocopy  12/07/2009   per  Dr. [name], benign polyps, repeat in 10 yrs   . CORONARY ARTERY BYPASS GRAFT N/A 11/22/2015   Procedure: CORONARY ARTERY BYPASS GRAFTING (CABG) times five using the left internal mammary, right greater saphenous vein EVH, and left thigh greater saphenous vein EVH;  Surgeon: [name], MD;  Location: [place];  Service: Open Heart Surgery;  Laterality: N/A;  . frozen shoulder release Right   . INTRAVASCULAR PRESSURE WIRE/FFR STUDY N/A 12/14/2018   Procedure: INTRAVASCULAR PRESSURE WIRE/FFR STUDY;  Surgeon: Jettie Booze, MD;  Location: [place] [facility];  Service: Cardiovascular;  Laterality: N/A;  . LEFT HEART CATH AND CORS/GRAFTS ANGIOGRAPHY N/A 10/27/2017   Procedure: LEFT HEART CATH AND CORS/GRAFTS ANGIOGRAPHY;  Surgeon: [name], MD;  Location: [place];  Service: Cardiovascular;  Laterality: N/A;  . LEFT HEART CATH AND CORS/GRAFTS ANGIOGRAPHY N/A 12/14/2018   Procedure: LEFT HEART CATH AND CORS/GRAFTS ANGIOGRAPHY;  Surgeon: Jettie Booze, MD;  Location: [facility] [place] [facility];  Service: Cardiovascular;  Laterality: N/A;  . TEE WITHOUT CARDIOVERSION N/A 11/22/2015   Procedure: TRANSESOPHAGEAL ECHOCARDIOGRAM (TEE);  Surgeon: [name], MD;  Location: [facility];  Service: Open Heart Surgery;  Laterality: N/A;  . WRIST GANGLION EXCISION Right     FAMILY HISTORY Family History  Problem Relation Age of Onset  . Heart failure Father   . Anuerysm Brother        AAA  . Hyperlipidemia Sister   . Diabetes Sister   . Hypertension Sister   . Kidney failure Sister   . Diabetes Maternal Grandmother   . Hyperlipidemia Sister   . Diabetes Sister   . Colon polyps Sister   . Kidney disease Sister   . Other Brother        COVID 19  . Diabetes Son   . Heart disease Son   . Heart attack Neg Hx   . Colon cancer Neg Hx   . Stomach cancer Neg Hx   . Esophageal cancer Neg Hx         SOCIAL HISTORY: Married, lives in [place] with his wife, Lupita Dawn of 30 plus  years.  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  He walks frequently and stays active on his off days.  His granddaughter dominique who is 10 also lives in his home.  He has 5 kids, ages 43-48.  Two are in [place] and the rest are spread over the [place].  He has 6 grandchildren.  No previous hazardous exposures.     ADVANCED DIRECTIVES: Not in place   HEALTH MAINTENANCE: Social History   Tobacco Use  . Smoking status: Former Smoker    Packs/Quayle: 0.25    Years: 20.00    Pack years:  5.00    Types: Cigarettes    Quit date: 10/07/2013    Years since quitting: 6.4  . Smokeless tobacco: Never Used  . Tobacco comment: quit cigarettes, might have a cigar 1 X month  Substance Use Topics  . Alcohol use: Yes    Alcohol/week: 3.0 - 4.0 standard drinks    Types: 3 - 4 Standard drinks or equivalent per week  . Drug use: No     Colonoscopy: 2011, scheduled for tomorrow  PSA: 03/2019   No Known Allergies  Current Outpatient Medications  Medication Sig Dispense Refill  . Accu-Chek Softclix Lancets lancets 1 each by Other route daily. Use to monitor glucose levels daily; E11.8 100 each 2  . acetaminophen (TYLENOL) 500 MG tablet Take 500 mg by mouth as needed.    . APPLE CIDER VINEGAR PO Take by mouth.    Marland Kitchen aspirin EC 81 MG tablet Take 1 tablet (81 mg total) by mouth daily.    . benazepril (LOTENSIN) 20 MG tablet TAKE 1 TABLET EVERY Petros 90 tablet 3  . Bismuth Subsalicylate 098 MG TABS Take 1 tablet (262 mg total) by mouth in the morning, at noon, in the evening, and at bedtime for 14 days. 56 tablet 0  . Blood Glucose Monitoring Suppl (ACCU-CHEK AVIVA PLUS) w/Device KIT 1 each by Does not apply route daily. Use to monitor glucose levels daily; E11.8 1 kit 0  . bromocriptine (PARLODEL) 2.5 MG tablet Take 1 tablet by mouth once daily 90 tablet 0  . carvedilol (COREG) 3.125 MG tablet Take 1 tablet (3.125 mg total) by mouth 2  (two) times daily with a meal. 180 tablet 1  . Cinnamon 500 MG capsule Take 1 capsule by mouth daily.    . Coenzyme Q10 (COQ-10) 100 MG CAPS Take 1 tablet by mouth daily.    . Cyanocobalamin (VITAMIN B-12 PO) Take 1 tablet by mouth daily.    Marland Kitchen doxycycline (VIBRAMYCIN) 100 MG capsule Take 1 capsule (100 mg total) by mouth 2 (two) times daily for 14 days. 28 capsule 0  . ezetimibe (ZETIA) 10 MG tablet Take 1 tablet (10 mg total) by mouth daily. 90 tablet 2  . ferrous sulfate 325 (65 FE) MG tablet Take 325 mg by mouth daily with breakfast.    . Garlic 1191 MG CAPS Take 1 tablet by mouth daily.    Marland Kitchen glucose blood (ACCU-CHEK AVIVA PLUS) test strip 1 each by Other route daily. Use to monitor glucose levels daily; E11.8 100 each 2  . hydrocortisone (ANUSOL-HC) 2.5 % rectal cream Place 1 application rectally 2 (two) times daily. 30 g 1  . metFORMIN (GLUCOPHAGE) 500 MG tablet Take 4 tablets by mouth daily 360 tablet 2  . metroNIDAZOLE (FLAGYL) 500 MG tablet Take 1 tablet (500 mg total) by mouth 4 (four) times daily for 14 days. 56 tablet 0  . Multiple Vitamin (MULTIVITAMIN) tablet Take 1 tablet by mouth daily.      . nitroGLYCERIN (NITROSTAT) 0.4 MG SL tablet Place 1 tablet (0.4 mg total) under the tongue every 5 (five) minutes as needed for chest pain. (Patient not taking: Reported on 02/22/2020) 75 tablet 1  . pantoprazole (PROTONIX) 40 MG tablet Take 1 tablet (40 mg total) by mouth 2 (two) times daily. 90 tablet 3  . ranolazine (RANEXA) 500 MG 12 hr tablet Take 1 tablet (500 mg total) by mouth 2 (two) times daily. 180 tablet 3  . repaglinide (PRANDIN) 0.5 MG tablet Take 1 tablet (0.5  mg total) by mouth 2 (two) times daily before a meal. 180 tablet 3  . rosuvastatin (CRESTOR) 40 MG tablet Take 1 tablet (40 mg total) by mouth daily. 90 tablet 3  . sildenafil (VIAGRA) 100 MG tablet TAKE ONE TABLET BY MOUTH DAILY AS NEEDED FOR ERECTILE DYSFUNCTION 30 tablet 0  . tetracycline (SUMYCIN) 500 MG capsule Take 1  capsule (500 mg total) by mouth 4 (four) times daily for 14 days. 56 capsule 0   No current facility-administered medications for this visit.   Facility-Administered Medications Ordered in Other Visits  Medication Dose Route Frequency Provider Last Rate Last Admin  . ferumoxytol (FERAHEME) 510 mg in sodium chloride 0.9 % 100 mL IVPB  510 mg Intravenous Once Allene Furuya, Virgie Dad, MD 468 mL/hr at 03/01/20 1659 510 mg at 03/01/20 1659    OBJECTIVE: African-American man evaluated in a recliner  There were no vitals filed for this visit.   There is no height or weight on file to calculate BMI.   Wt Readings from Last 3 Encounters:  02/22/20 188 lb 11.2 oz (85.6 kg)  02/15/20 192 lb (87.1 kg)  02/08/20 187 lb (84.8 kg)  For vitals on 03/01/2020 visit please see the infusion lab flow sheet    ECOG FS:1 - Symptomatic but completely ambulatory   LAB RESULTS:  CMP     Component Value Date/Time   NA 138 02/22/2020 0828   NA 141 10/26/2019 0903   K 4.1 02/22/2020 0828   CL 108 02/22/2020 0828   CO2 24 02/22/2020 0828   GLUCOSE 213 (H) 02/22/2020 0828   BUN 18 02/22/2020 0828   BUN 14 10/26/2019 0903   CREATININE 1.23 02/22/2020 0828   CREATININE 1.19 11/14/2015 1135   CALCIUM 8.8 (L) 02/22/2020 0828   PROT 8.0 02/22/2020 0828   PROT 7.6 10/26/2019 0903   ALBUMIN 3.8 02/22/2020 0828   ALBUMIN 4.4 10/26/2019 0903   AST 34 02/22/2020 0828   ALT 42 02/22/2020 0828   ALKPHOS 33 (L) 02/22/2020 0828   BILITOT 0.5 02/22/2020 0828   GFRNONAA >60 02/22/2020 0828   GFRAA >60 02/22/2020 0828    Lab Results  Component Value Date   TOTALPROTELP 7.5 02/22/2020   ALBUMINELP 3.9 02/22/2020   A1GS 0.2 02/22/2020   A2GS 0.8 02/22/2020   BETS 1.0 02/22/2020   GAMS 1.7 02/22/2020   MSPIKE 1.2 (H) 02/22/2020    No results found for: KPAFRELGTCHN, LAMBDASER, [facility]  Lab Results  Component Value Date   WBC 3.8 (L) 02/22/2020   NEUTROABS 2.2 02/22/2020   HGB 12.2 (L) 02/22/2020    HCT 37.1 (L) 02/22/2020   MCV 89.6 02/22/2020   PLT 137 (L) 02/22/2020      Chemistry      Component Value Date/Time   NA 138 02/22/2020 0828   NA 141 10/26/2019 0903   K 4.1 02/22/2020 0828   CL 108 02/22/2020 0828   CO2 24 02/22/2020 0828   BUN 18 02/22/2020 0828   BUN 14 10/26/2019 0903   CREATININE 1.23 02/22/2020 0828   CREATININE 1.19 11/14/2015 1135      Component Value Date/Time   CALCIUM 8.8 (L) 02/22/2020 0828   ALKPHOS 33 (L) 02/22/2020 0828   AST 34 02/22/2020 0828   ALT 42 02/22/2020 0828   BILITOT 0.5 02/22/2020 0828       No results found for: LABCA2  No components found for: DPOEUM353  No results for input(s): INR in the last 168 hours.  No  results found for: LABCA2  No results found for: LOV564  No results found for: PPI951  No results found for: OAC166  No results found for: CA2729  No components found for: HGQUANT  No results found for: CEA1 / No results found for: CEA1   No results found for: AFPTUMOR  No results found for: CHROMOGRNA  No results found for: PSA1  No visits with results within 3 [place](s) from this visit.  Latest known visit with results is:  Clinical Support on 02/22/2020  Component Date Value Ref Range Status  . HIV Screen 4th Generation wRfx 02/22/2020 Non Reactive  Non Reactive Final   Performed at [facility], [place] [address], [place], [place] [address]  . Hep B Core Total Ab 02/22/2020 NON REACTIVE  NON REACTIVE Final   Performed at [facility], [place] [address], [place], [place] [address]  . Hep B S Ab 02/22/2020 NON REACTIVE  NON REACTIVE Final   Comment: (NOTE) Inconsistent with immunity, less than 10 mIU/mL. Performed at [facility], [place] [address] [place], [place] [address]   . HCV Ab 02/22/2020 NON REACTIVE  NON REACTIVE Final   Comment: (NOTE) Nonreactive HCV antibody screen is consistent with no HCV infections,  unless recent infection is suspected or other evidence exists  to indicate HCV infection. Performed at [facility], [place] [address] [place], [place] [address]   . Total Protein ELP 02/22/2020 7.5  6.0 - 8.5 g/dL Final  . Albumin ELP 02/22/2020 3.9  2.9 - 4.4 g/dL Final  . Alpha-1-Globulin 02/22/2020 0.2  0.0 - 0.4 g/dL Final  . Alpha-2-Globulin 02/22/2020 0.8  0.4 - 1.0 g/dL Final  . Beta Globulin 02/22/2020 1.0  0.7 - 1.3 g/dL Final  . Gamma Globulin 02/22/2020 1.7  0.4 - 1.8 g/dL Final  . M-Spike, % 02/22/2020 1.2* Not Observed g/dL Final  . Globulin, Total 02/22/2020 3.6  2.2 - 3.9 g/dL Corrected  . A/G Ratio 02/22/2020 1.1  0.7 - 1.7 Corrected  . Comment 02/22/2020 Comment   Corrected   Comment: (NOTE) Protein electrophoresis scan will follow via computer, mail, or courier delivery.   Marland Kitchen SPEP Interpretation 02/22/2020 .   Final   Comment: (NOTE) Performed At: [facility] [address] [place], [place] [id number] Rush Farmer MD [id number]   . CRP 02/22/2020 0.6  <1.0 mg/dL Final   Performed at [facility] [address] [place], [place] [address]  . Sed Rate 02/22/2020 11  0 - 16 mm/hr Final   Performed at [facility], [place] [address], [place], [place] [address]  . Vitamin B-12 02/22/2020 592  180 - 914 pg/mL Final   Comment: (NOTE) This assay is not validated for testing neonatal or myeloproliferative syndrome specimens for Vitamin B12 levels. Performed at [facility], [place] [address] [place], [place] [address]   . Folate 02/22/2020 16.1  >5.9 ng/mL Final   Performed at [place] [address] [place], [place] [address]  . Ferritin 02/22/2020 13* 24 - 336 ng/mL Final   Performed at [facility], [place] [address], [place], [place] [address]  . Iron 02/22/2020 50  42 - 163 ug/dL Final  . TIBC 02/22/2020 466* 202 - 409 ug/dL Final  . Saturation Ratios 02/22/2020 11* 20 - 55 % Final  . UIBC 02/22/2020 416*  117 - 376 ug/dL Final   Performed at [facility], [place] [address], [place], [place] [address]  . LDH 02/22/2020 198* 98 - 192 U/L Final   Performed at [facility], [place] [address] [place], [place] [address]  . Sodium 02/22/2020 138  135 - 145 mmol/L Final  . Potassium 02/22/2020 4.1  3.5 - 5.1 mmol/L Final  . Chloride 02/22/2020 108  98 - 111 mmol/L Final  . CO2 02/22/2020 24  22 - 32 mmol/L Final  . Glucose, Bld 02/22/2020 213* 70 - 99 mg/dL Final   Glucose reference range applies only to samples taken after fasting for at least 8 hours.  . BUN 02/22/2020 18  8 - 23 mg/dL Final  . Creatinine 02/22/2020 1.23  0.61 - 1.24 mg/dL Final  . Calcium 02/22/2020 8.8* 8.9 - 10.3 mg/dL Final  . Total Protein 02/22/2020 8.0  6.5 - 8.1 g/dL Final  . Albumin 02/22/2020 3.8  3.5 - 5.0 g/dL Final  . AST 02/22/2020 34  15 - 41 U/L Final  . ALT 02/22/2020 42  0 - 44 U/L Final  . Alkaline Phosphatase 02/22/2020 33* 38 - 126 U/L Final  . Total Bilirubin 02/22/2020 0.5  0.3 - 1.2 mg/dL Final  . GFR, Est Non Af Am 02/22/2020 >60  >60 mL/min Final  . GFR, Est AFR Am 02/22/2020 >60  >60 mL/min Final  . Anion gap 02/22/2020 6  5 - 15 Final   Performed at [facility], [place] [address] [place], [place] [address]  . Smear Review 02/22/2020 SMEAR STAINED AND AVAILABLE FOR REVIEW   Final   Performed at [facility], [address] [place], [place] [address]  . Retic Ct Pct 02/22/2020 1.3  0.4 - 3.1 % Final  . RBC. 02/22/2020 4.08* 4.22 - 5.81 MIL/uL Final  . Retic Count, Absolute 02/22/2020 54.7  19.0 - 186.0 K/uL Final  . Immature Retic Fract 02/22/2020 14.0  2.3 - 15.9 % Final   Performed at [facility], [place] [address], [place], [place] [address]  . WBC Count 02/22/2020 3.8* 4.0 - 10.5 K/uL Final  . RBC 02/22/2020 4.14* 4.22 - 5.81 MIL/uL Final  . Hemoglobin 02/22/2020 12.2* 13.0 -  17.0 g/dL Final  . HCT 02/22/2020 37.1* 39.0 - 52.0 % Final  . MCV 02/22/2020 89.6  80.0 - 100.0 fL Final  . MCH 02/22/2020 29.5  26.0 - 34.0 pg Final  . MCHC 02/22/2020 32.9  30.0 - 36.0 g/dL Final  . RDW 02/22/2020 12.8  11.5 - 15.5 % Final  . Platelet Count 02/22/2020 137* 150 - 400 K/uL Final  . nRBC 02/22/2020 0.0  0.0 - 0.2 % Final  . Neutrophils Relative % 02/22/2020 57  % Final  . Neutro Abs 02/22/2020 2.2  1.7 - 7.7 K/uL Final  . Lymphocytes Relative 02/22/2020 30  % Final  . Lymphs Abs 02/22/2020 1.2  0.7 - 4.0 K/uL Final  . Monocytes Relative 02/22/2020 11  % Final  . Monocytes Absolute 02/22/2020 0.4  0.1 - 1.0 K/uL Final  . Eosinophils Relative 02/22/2020 1  % Final  . Eosinophils Absolute 02/22/2020 0.0  0.0 - 0.5 K/uL Final  . Basophils Relative 02/22/2020 1  % Final  . Basophils Absolute 02/22/2020 0.0  0.0 - 0.1 K/uL Final  . Immature Granulocytes 02/22/2020 0  % Final  . Abs Immature Granulocytes 02/22/2020 0.01  0.00 - 0.07 K/uL Final   Performed at [facility], [place]  [address], [place], [place] [address]  . IgG (Immunoglobin G), Serum 02/22/2020 1,951* 603 - 1,613 mg/dL Final  . IgA 02/22/2020 122  61 - 437 mg/dL Final  . IgM (Immunoglobulin M), Srm 02/22/2020 23  20 - 172 mg/dL Final   Result confirmed on concentration.  . IFE 1 02/22/2020 Comment*  Final   Comment: (NOTE) Immunofixation shows IgG monoclonal protein with lambda light chain specificity. Performed At: [facility] [place], [place] [id number] Rush Farmer MD IR:[id number]     (this displays the last labs from the last 3 days)  Lab Results  Component Value Date   TOTALPROTELP 7.5 02/22/2020   ALBUMINELP 3.9 02/22/2020   A1GS 0.2 02/22/2020   A2GS 0.8 02/22/2020   BETS 1.0 02/22/2020   GAMS 1.7 02/22/2020   MSPIKE 1.2 (H) 02/22/2020   (this displays SPEP labs)  No results found for: KPAFRELGTCHN, LAMBDASER, KAPLAMBRATIO (kappa/lambda  light chains)  No results found for: HGBA, HGBA2QUANT, HGBFQUANT, HGBSQUAN (Hemoglobinopathy evaluation)   Lab Results  Component Value Date   LDH 198 (H) 02/22/2020    Lab Results  Component Value Date   IRON 50 02/22/2020   TIBC 466 (H) 02/22/2020   IRONPCTSAT 11 (L) 02/22/2020   (Iron and TIBC)  Lab Results  Component Value Date   FERRITIN 13 (L) 02/22/2020    Urinalysis    Component Value Date/Time   COLORURINE YELLOW 05/19/2013 2205   APPEARANCEUR CLEAR 05/19/2013 2205   LABSPEC 1.034 (H) 05/19/2013 2205   PHURINE 6.5 05/19/2013 2205   GLUCOSEU >1000 (A) 05/19/2013 2205   HGBUR NEGATIVE 05/19/2013 2205   BILIRUBINUR neg 04/11/2019 1007   KETONESUR NEGATIVE 05/19/2013 2205   PROTEINUR Positive (A) 04/11/2019 1007   PROTEINUR NEGATIVE 05/19/2013 2205   UROBILINOGEN 0.2 04/11/2019 1007   UROBILINOGEN 1.0 05/19/2013 2205   NITRITE neg 04/11/2019 1007   NITRITE NEGATIVE 05/19/2013 2205   LEUKOCYTESUR Negative 04/11/2019 1007     STUDIES: US Abdomen Complete  Result Date: 02/21/2020 CLINICAL DATA:  69 year old male with pancytopenia. Query liver and spleen. EXAM: ABDOMEN ULTRASOUND COMPLETE COMPARISON:  CTA chest 317. Right upper quadrant ultrasound 05/20/2013. FINDINGS: Gallbladder: No gallstones or wall thickening visualized. No sonographic Murphy sign noted by sonographer. Common bile duct: Diameter: 4 mm, normal. Liver: No focal lesion identified. Within normal limits in parenchymal echogenicity. Portal vein is patent on color Doppler imaging with normal direction of blood flow towards the liver. IVC: No abnormality visualized. Pancreas: Visualized portion unremarkable. Spleen: Up to 8.7 cm in length, diminutive. Estimated total splenic volume 88 mL (normal splenic volume range 83 - 412 mL). Right Kidney: Length: 10.9 cm. Chronic large simple appearing right renal cyst, 7.9 cm (image 70), partially visible in 2017. There is a 2nd smaller simple appearing 1.5 cm cyst.  No right hydronephrosis or solid right renal mass. Left Kidney: Length: 10.9 cm. Echogenicity within normal limits. No mass or hydronephrosis visualized. Abdominal aorta: No aneurysm visualized. Other findings: None. IMPRESSION: 1. No liver or splenic lesion by ultrasound. The spleen is diminutive. 2. No acute findings in the abdomen. Chronic benign right renal cysts. Electronically Signed   By: Genevie Ann M.D.   On: 02/21/2020 10:56      ASSESSMENT: 69 y.o. [place] man with  (1) iron deficiency anemia:  (a) colonoscopy 12/07/2009 showed no evidence of malignancy   (b) EGD 02/23/2020 showed gastritis and duodenitis with positive H. Pylori  (c) no improvement in ferritin or iron saturation despite compliant oral  iron intake  (d) Feraheme given 03/01/2020 and plan for 03/08/2020  (2) leukopenia: resolved by the time of initial visit 02/22/2020  (a)Lower limit of normal WBC for African Americans is 3.5  (b) intermittently lower in the past possibly secondary to transient viral infection  (3) Thrombocytopenia: minimal, with counts well over 100 K on multiple occasions  (a) abdominal ultrasound on 02/21/2020 shows no spleen enlargement  (b) blood film shows no schistocytes and no artifactual platelet clumping   (c) chronic, possibly secondary to medications, requiring only follow-up  (4) MGUS:  (a) SPEP 02/22/2020 shows an M spike of 1.2%, IFE confirms IgG lambda clonality    PLAN: I met with Brenin in the treatment area to go over his lab results.  He understands despite his being compliant with oral iron he really received no benefit from this as far as his iron stores are concerned.  Accordingly I suggested he stop that medication which is causing him some symptoms.  He will receive Feraheme today and next week.  That is the equivalent of 6 months of oral iron supplementation and should take care of the iron deficiency problem at least temporarily.  He understands the possible toxicities  side effects and complications of Feraheme infusions.  These are generally well-tolerated but occasionally they can be severe reactions.  He is being treated for H. pylori gastritis.  Assuming that may have been the reason for his GI blood loss that problem should resolve.  If there is no further bleeding he will need no further iron supplementation.  This requires only further follow-up.  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  I explained in detail to Mr. Renda that antibodies are made by plasma cells, that each plasma cell makes a different antibody, and that if there is a clone of plasma cells they will make an identical antibody and that is detectable in the blood as an M spike.  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  This is not a cancer but approximately 1 %/year of patients like him will progress to myeloma.  Interestingly Mr. Farooq tells me his sister has a similar problem.  He does not have all the details but he will obtain them  All he needs from this point is observation.  I would suggest checking a ferritin and iron saturation as well as an SPEP and CBC every 6 months for the next 2 years and then yearly.  He would prefer to have this done through Dr. Barbie Banner office and I am certainly comfortable with that.  Accordingly we are making no further appointments for Mr. Arseneault after his infusion next week.  I will be glad to see him at any point in the future of course if the need arises  Total encounter time 30 minutes.*      :Chauncey Cruel, MD Medical Oncology and Hematology [facility] [address] [place], [place] [address] Tel. [phone]    Fax. [phone]   *Total Encounter Time as defined by the Centers for Medicare and Medicaid Services includes, in addition to the face-to-face time of a patient visit (documented in the note above) non-face-to-face time: obtaining and reviewing  outside history, ordering and reviewing medications, tests or procedures, care coordination (communications with other health care professionals or caregivers) and documentation in the medical record.

## 2020-03-01 NOTE — Telephone Encounter (Signed)
Informed patient we will send in another regimen for H. Pylori treatment to replace the one that is not covered. Pt states he has picked up metronidazole. Informed patient that I am sending in doxycycline, pepto bismol to take along with the flagyl that he has already picked up. To take those medications along with pantoprazole twice daily x 14 days. Patient verbalized understanding. Also patient got the hydrocortisone decreased in price so he will pick that up today as well.

## 2020-03-02 ENCOUNTER — Other Ambulatory Visit: Payer: Self-pay | Admitting: Interventional Cardiology

## 2020-03-02 ENCOUNTER — Telehealth: Payer: Self-pay | Admitting: Oncology

## 2020-03-02 NOTE — Telephone Encounter (Signed)
No 6/3 los. No changes made to pt's schedule.  

## 2020-03-07 ENCOUNTER — Ambulatory Visit: Payer: Medicare HMO | Admitting: Podiatry

## 2020-03-07 ENCOUNTER — Other Ambulatory Visit: Payer: Self-pay

## 2020-03-07 DIAGNOSIS — M2041 Other hammer toe(s) (acquired), right foot: Secondary | ICD-10-CM | POA: Diagnosis not present

## 2020-03-07 DIAGNOSIS — M79674 Pain in right toe(s): Secondary | ICD-10-CM | POA: Diagnosis not present

## 2020-03-07 DIAGNOSIS — M79675 Pain in left toe(s): Secondary | ICD-10-CM

## 2020-03-07 DIAGNOSIS — M2042 Other hammer toe(s) (acquired), left foot: Secondary | ICD-10-CM | POA: Diagnosis not present

## 2020-03-07 DIAGNOSIS — B351 Tinea unguium: Secondary | ICD-10-CM

## 2020-03-07 DIAGNOSIS — E118 Type 2 diabetes mellitus with unspecified complications: Secondary | ICD-10-CM

## 2020-03-08 ENCOUNTER — Other Ambulatory Visit: Payer: Self-pay

## 2020-03-08 ENCOUNTER — Inpatient Hospital Stay: Payer: Medicare HMO

## 2020-03-08 ENCOUNTER — Encounter: Payer: Self-pay | Admitting: Podiatry

## 2020-03-08 VITALS — BP 111/67 | HR 72 | Temp 97.7°F | Resp 16

## 2020-03-08 DIAGNOSIS — D508 Other iron deficiency anemias: Secondary | ICD-10-CM

## 2020-03-08 DIAGNOSIS — Z79899 Other long term (current) drug therapy: Secondary | ICD-10-CM | POA: Diagnosis not present

## 2020-03-08 DIAGNOSIS — D472 Monoclonal gammopathy: Secondary | ICD-10-CM | POA: Diagnosis not present

## 2020-03-08 DIAGNOSIS — D509 Iron deficiency anemia, unspecified: Secondary | ICD-10-CM | POA: Diagnosis not present

## 2020-03-08 DIAGNOSIS — D61818 Other pancytopenia: Secondary | ICD-10-CM | POA: Diagnosis not present

## 2020-03-08 MED ORDER — ACETAMINOPHEN 325 MG PO TABS
ORAL_TABLET | ORAL | Status: AC
  Filled 2020-03-08: qty 2

## 2020-03-08 MED ORDER — SODIUM CHLORIDE 0.9 % IV SOLN
510.0000 mg | Freq: Once | INTRAVENOUS | Status: AC
  Administered 2020-03-08: 510 mg via INTRAVENOUS
  Filled 2020-03-08: qty 510

## 2020-03-08 MED ORDER — DIPHENHYDRAMINE HCL 25 MG PO CAPS
ORAL_CAPSULE | ORAL | Status: AC
  Filled 2020-03-08: qty 1

## 2020-03-08 MED ORDER — ACETAMINOPHEN 325 MG PO TABS
650.0000 mg | ORAL_TABLET | Freq: Once | ORAL | Status: AC
  Administered 2020-03-08: 650 mg via ORAL

## 2020-03-08 MED ORDER — DIPHENHYDRAMINE HCL 25 MG PO CAPS
25.0000 mg | ORAL_CAPSULE | Freq: Once | ORAL | Status: AC
  Administered 2020-03-08: 25 mg via ORAL

## 2020-03-08 NOTE — Progress Notes (Signed)
  Subjective:  Patient ID: Jerome Vaughn, male    DOB: Feb 15, 1951,  MRN: [id number]  Chief Complaint  Patient presents with  . Nail Problem    pt is here for bil toenail fungus, pt states that he is also here for possible toenail fungus as well.   69 y.o. male returns for the above complaint.  Patient presents with thickened elongated dystrophic toenails x10.  They have been painful to touch.  He has not been able to debride them down himself.  He would like for me to debride them down.  He also has secondary complaint of hammertoe contractures and he is diabetic he just wants to make sure that there is no ulceration the can form.  He is also interested in diabetic shoes as well.  He denies any other acute complaints.  Objective:  There were no vitals filed for this visit. Podiatric Exam: Vascular: dorsalis pedis and posterior tibial pulses are palpable bilateral. Capillary return is immediate. Temperature gradient is WNL. Skin turgor WNL  Sensorium: Normal Semmes Weinstein monofilament test. Normal tactile sensation bilaterally. Nail Exam: Pt has thick disfigured discolored nails with subungual debris noted bilateral entire nail hallux through fifth toenails.  Pain on palpation to the nails. Ulcer Exam: There is no evidence of ulcer or pre-ulcerative changes or infection. Orthopedic Exam: Muscle tone and strength are WNL. No limitations in general ROM. No crepitus or effusions noted. HAV  B/L.  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 Skin: No Porokeratosis. No infection or ulcers    Assessment & Plan:   1. Pain due to onychomycosis of toenails of both feet   2. Hammertoe, bilateral     Patient was evaluated and treated and all questions answered.  Bilateral hammertoe contractures 2 through 5 -Clinically given the patient has these contracture he is a high risk of developing ulceration in the setting of neuropathy as well as diabetes.  I believe patient will benefit from diabetic shoes to help offload and  evenly distribute the pressure as well as take the pressure off the hammertoe contractures. -He will be scheduled see Liliane Channel for diabetic shoes  Onychomycosis with pain  -Nails palliatively debrided as below. -Educated on self-care  Procedure: Nail Debridement Rationale: pain  Type of Debridement: manual, sharp debridement. Instrumentation: Nail nipper, rotary burr. Number of Nails: 10  Procedures and Treatment: Consent by patient was obtained for treatment procedures. The patient understood the discussion of treatment and procedures well. All questions were answered thoroughly reviewed. Debridement of mycotic and hypertrophic toenails, 1 through 5 bilateral and clearing of subungual debris. No ulceration, no infection noted.  Return Visit-Office Procedure: Patient instructed to return to the office for a follow up visit 3 months for continued evaluation and treatment.  [name], DPM    No follow-ups on file.

## 2020-03-08 NOTE — Patient Instructions (Signed)

## 2020-03-15 ENCOUNTER — Telehealth: Payer: Self-pay | Admitting: Endocrinology

## 2020-03-15 ENCOUNTER — Ambulatory Visit: Payer: Medicare HMO | Admitting: Orthotics

## 2020-03-15 ENCOUNTER — Other Ambulatory Visit: Payer: Self-pay

## 2020-03-15 DIAGNOSIS — M79675 Pain in left toe(s): Secondary | ICD-10-CM

## 2020-03-15 DIAGNOSIS — M2041 Other hammer toe(s) (acquired), right foot: Secondary | ICD-10-CM

## 2020-03-15 DIAGNOSIS — E118 Type 2 diabetes mellitus with unspecified complications: Secondary | ICD-10-CM

## 2020-03-15 DIAGNOSIS — B351 Tinea unguium: Secondary | ICD-10-CM

## 2020-03-15 NOTE — Telephone Encounter (Signed)
Returned pt call. LVM requesting returned call.  With regard to below mentioned concerns:  Paperwork has not been received from [facility]. Therefore, pt will need to call to request these docs be sent to our office. With regard to CGM (if that is what he is referring to when he states "non-stick meter), pt does not meet criteria for a CGM. May send order but he would be responsible for the device as this would not be covered by his insurance.  Will await his returned call.

## 2020-03-15 NOTE — Progress Notes (Signed)

## 2020-03-15 NOTE — Telephone Encounter (Signed)
Patient requests to be called at ph# [phone] re: Paperwork for [facility] and Patient eligibility for non stick meter. Patient attempted to send Dr. Loanne Drilling a message through [place], however patient was unable to do so (when patient scrolls down to send message there is no send button -it only allows patient to fill in his message.-Patient did not have time to contact MyChart Help desk).

## 2020-03-19 ENCOUNTER — Telehealth: Payer: Self-pay

## 2020-03-19 NOTE — Telephone Encounter (Signed)
FAXED [place]: [facility]  Document: Order for diabetic shoes - DOES NOT QUALIFY. Order cannot be submitted Other records requested: None requested  All above requested information has been faxed successfully to the Company listed above. Documents and fax confirmation have been placed in the faxed file for future reference.

## 2020-04-03 ENCOUNTER — Ambulatory Visit: Payer: Medicare HMO

## 2020-04-04 ENCOUNTER — Telehealth: Payer: Self-pay | Admitting: Podiatry

## 2020-04-04 DIAGNOSIS — K219 Gastro-esophageal reflux disease without esophagitis: Secondary | ICD-10-CM | POA: Insufficient documentation

## 2020-04-04 NOTE — Telephone Encounter (Signed)
Left message for pt that his appt is canceled for 7.8.2021 as his endocrinologist stated pt does not qualify for diabetic shoes.

## 2020-04-04 NOTE — Chronic Care Management (AMB) (Signed)
Chronic Care Management Pharmacy  Name: Jerome Vaughn  MRN: [id number] DOB: [birth year]  Initial Questions: 1. Have you seen any other providers since your last visit? NA 2. Any changes in your medicines or health? No   Chief Complaint/ HPI  Jerome Vaughn,  69 y.o. , male presents for their Initial CCM visit with the clinical pharmacist In office.  Patient reports doing well overall, however, he does note feeling "funny" after he takes his metformin in the morning (takes all 4 tablets at the same time). He states he thought about splitting dose of metformin to BID (AM and lunch), but reports he does not eat a big lunch.   Patient reports improvement in diet (has incorporated fruits/ vegetables and reports less snacking).  He states his physical activity consisted of walking at work and doing stairs.    PCP : [name], MD  Their chronic conditions include: DM,, HTN, CAD, s/p CABGx 5  systolic HF, HLD, GERD, Pain, iron deficiency anemia, ED, hemorrhoids   Office Visits: 01/03/20- Alysia Penna, MD- Patient presented for office visit for rectal bleeding. Patient referred to GI for possible colonoscopy.   Consult Visit: 03/01/2020- Oncology- [name], MD- Patient presented for office visit for iron deficiency anemia, MGUS. Patient given Feraheme 03/01/20 and plan for 03/08/20. Patient to obtain SPEP and CBC every 6 months for next 2 years and then yearly.   02/22/20- Oncology- Gardenia Phlegm, NP- Patient presented for office visit for iron deficiency anemia. Patient to continue oral iron supplementation and f/u with GI.    02/08/20- Endocrinology- [name], MD- Patient presented for office visit for DM follow up. No major interventions. Patient to see PCP for BP readings rechecked. Patient to see foot specialist. Patient to return in 3-4 months.   01/26/20- Cardiology- Daneen Schick, MD- Patient presented for office visit for CAD and HF follow up. Patient to continue secondary  prevention (carvedilol and Lotensin). SGLT2 inhibiter allowed given history of heart failure. Patient to follow up in  12 months.   01/24/20- Gastroenterology- Carl Best, NP- Patient presented for office visit for indigestion and rectal bleeding. Patient to obtain EGD after cardiology visit. Patient to continue famotidine and increase to 40m once to BID. Patient to obtain colonoscopy and use Desitin topical to anal area.   Medications: Outpatient Encounter Medications as of 04/05/2020  Medication Sig  . Accu-Chek Softclix Lancets lancets 1 each by Other route daily. Use to monitor glucose levels daily; E11.8  . acetaminophen (TYLENOL) 500 MG tablet Take 500 mg by mouth as needed.  . APPLE CIDER VINEGAR PO Take by mouth daily.   . Ascorbic Acid (VITAMIN C PO) Take 1 tablet by mouth daily.  .Marland Kitchenaspirin EC 81 MG tablet Take 1 tablet (81 mg total) by mouth daily.  . benazepril (LOTENSIN) 20 MG tablet TAKE 1 TABLET EVERY [place]  . Blood Glucose Monitoring Suppl (ACCU-CHEK AVIVA PLUS) w/Device KIT 1 each by Does not apply route daily. Use to monitor glucose levels daily; E11.8  . bromocriptine (PARLODEL) 2.5 MG tablet Take 1 tablet by mouth once daily  . carvedilol (COREG) 3.125 MG tablet TAKE 1 TABLET (3.125 MG TOTAL) BY MOUTH 2 (TWO) TIMES DAILY WITH A MEAL.  .Marland KitchenCinnamon 500 MG capsule Take 1 capsule by mouth daily.  . Coenzyme Q10 (COQ-10) 100 MG CAPS Take 1 tablet by mouth daily.  . Cyanocobalamin (VITAMIN B-12 PO) Take 1 tablet by mouth daily.  .Marland Kitchenezetimibe (ZETIA) 10  MG tablet Take 1 tablet (10 mg total) by mouth daily.  . ferrous sulfate 325 (65 FE) MG tablet Take 325 mg by mouth daily with breakfast.  . Garlic 7035 MG CAPS Take 1 tablet by mouth daily.  Marland Kitchen glucose blood (ACCU-CHEK AVIVA PLUS) test strip 1 each by Other route daily. Use to monitor glucose levels daily; E11.8  . hydrocortisone (ANUSOL-HC) 2.5 % rectal cream Place 1 application rectally 2 (two) times daily.  . metFORMIN  (GLUCOPHAGE) 500 MG tablet Take 4 tablets by mouth daily  . Multiple Vitamin (MULTIVITAMIN) tablet Take 1 tablet by mouth daily.    . Omega-3 Fatty Acids (FISH OIL PO) Take 1 capsule by mouth daily.  . pantoprazole (PROTONIX) 40 MG tablet Take 1 tablet (40 mg total) by mouth 2 (two) times daily.  . ranolazine (RANEXA) 500 MG 12 hr tablet Take 1 tablet (500 mg total) by mouth 2 (two) times daily.  . repaglinide (PRANDIN) 0.5 MG tablet Take 1 tablet (0.5 mg total) by mouth 2 (two) times daily before a meal.  . rosuvastatin (CRESTOR) 40 MG tablet Take 1 tablet (40 mg total) by mouth daily.  . sildenafil (VIAGRA) 100 MG tablet TAKE ONE TABLET BY MOUTH DAILY AS NEEDED FOR ERECTILE DYSFUNCTION  . VITAMIN E PO Take 1 capsule by mouth daily.  . nitroGLYCERIN (NITROSTAT) 0.4 MG SL tablet Place 1 tablet (0.4 mg total) under the tongue every 5 (five) minutes as needed for chest pain. (Patient not taking: Reported on 04/05/2020)   No facility-administered encounter medications on file as of 04/05/2020.     Current Diagnosis/Assessment:  Goals Addressed            This Visit's Progress   . Pharmacy Care Plan       CARE PLAN ENTRY (see longitudinal plan of care for additional care plan information)  Current Barriers:  . Chronic Disease Management support, education, and care coordination needs related to Hypertension, Hyperlipidemia, Diabetes, Heart Failure, Coronary Artery Disease, and indigestion   Hypertension BP Readings from Last 3 Encounters:  03/08/20 111/67  03/01/20 138/86  02/23/20 105/66   . Pharmacist Clinical Goal(s): o Over the next 90 days, patient will work with PharmD and providers to maintain BP goal <130/80 . Current regimen:   Benazepril 6m ,1 tablet once daily  Carvedilol 3.1253m 1 tablet twice daily with a meal  . Patient self care activities - Over the next 90 days, patient will: o Check BP at least 1 to 2 times per week, document, and provide at future  appointments o Ensure daily salt intake < 2300 mg/[place] o Continue follow up visits with cardiology (Dr. SmTamala Julian   Hyperlipidemia Lab Results  Component Value Date/Time   LDLCALC 43 10/26/2019 09:03 AM   LDLDIRECT 204.8 09/05/2013 03:59 PM   . Pharmacist Clinical Goal(s): o Over the next 90 days, patient will work with PharmD and providers to maintain LDL goal < 70 . Current regimen:  . Rosuvastatin 4062m1 tablet once daily . ezetimibie 59m79m tablet once daily  . Patient self care activities - Over the next 90 days, patient will: o Continue current medications as directed.   Coronary artery disease (history of cardiac artery bypass graft x5) . Pharmacist Clinical Goal(s) o Over the next 90 days, patient will work with PharmD and providers to maintain on secondary prevention of cardiac events.  . Current regimen:  . Aspirin 81mg60mtablet once daily   . Nitroglycerin 0.4mg S7mplace 1 tablet  under tongue every five minutes as needed for chest pain . Ranolazine 552m, 1 tablet twice daily  . Rosuvastatin 475m 1 tablet once daily . Interventions: o Cautioned drug interaction between nitroglycerin and sildenafil.  . Patient self care activities o Patient will continue current medications and follow up with cardiology (Dr. SmTamala Julian   Diabetes Lab Results  Component Value Date/Time   HGBA1C 7.0 (A) 02/08/2020 08:35 AM   HGBA1C 6.5 (A) 10/06/2019 08:37 AM   HGBA1C 7.6 (H) 11/21/2015 06:20 PM   HGBA1C 7.1 (H) 02/19/2015 02:35 PM   . Pharmacist Clinical Goal(s): o Over the next 90 days, patient will work with PharmD and providers to achieve A1c goal <7% . Current regimen:   Metformin 50063m4 tablets once daily  repaglinide 0.5mg28m tablet twice daily before a meal   . Bromocriptine 2.5mg,67mtablet once daily   . Interventions: o Recommend separating dose of metformin to 2 tablets twice daily with food (breakfast and evening meal).  . Patient self care activities - Over the  next 90 days, patient will: o Check blood sugar twice daily, document, and provide at future appointments o Contact provider with any episodes of hypoglycemia  Indigestion . Pharmacist Clinical Goal(s) o Over the next 90 days, patient will work with PharmD and providers to minimize symptoms.  . Current regimen:  o Pantoprazole 40mg,52mablet twice daily  . Patient self care activities o Patient will continue current medication and follow up visits with gastroenterology (Dr. BeaverTarri GlennMedication management . Pharmacist Clinical Goal(s): o Over the next 90 days, patient will work with PharmD and providers to maintain optimal medication adherence . [facility]  . Interventions o Comprehensive medication review performed. o Continue current medication management strategy . Patient self care activities - Over the next 90 days, patient will: o Take medications as prescribed o Report any questions or concerns to PharmD and/or provider(s)  Initial goal documentation       SDOH Interventions     Most Recent Value  SDOH Interventions  Financial Strain Interventions Intervention Not Indicated  Transportation Interventions Intervention Not Indicated       Diabetes   Recent Relevant Labs: Lab Results  Component Value Date/Time   HGBA1C 7.0 (A) 02/08/2020 08:35 AM   HGBA1C 6.5 (A) 10/06/2019 08:37 AM   HGBA1C 7.6 (H) 11/21/2015 06:20 PM   HGBA1C 7.1 (H) 02/19/2015 02:35 PM   MICROALBUR 1.2 08/22/2014 10:34 AM   MICROALBUR 1.6 03/28/2011 11:41 AM    Checking BG: 2x per [place]  Recent FBG Readings: 120-130  Recent 2hr PP BG readings: 145  Patient has failed these meds in past: Invokana, Farxiga, Jardiance, Januvia, glipizide, pioglitazone (edema), semaglutide (insurance formulary)   Patient is currently controlled on the following medications:   Metformin 500mg, 81mblets once daily  repaglinide 0.5mg, 1 45mlet twice daily before a meal    . Bromocriptine 2.5mg, 1 t67met once daily    Last diabetic Eye exam:  Lab Results  Component Value Date/Time   HMDIABEYEEXA Retinopathy (A) 08/17/2017 12:00 AM  patient reports obtaining once a year   - last time was last Dec.   Last diabetic Foot exam: No results found for: HMDIABFOOTEX  Patient reports numbness/ tingling in fingertips.  -discuss with Dr. Fry aboutSarajane Jewsialing gabapentin.   We discussed: diet and exercise extensively   Plan Recommend separating dose of metformin to 2 tablets BID Continue current medications  Discussing with Dr. FrySarajane Jews  on starting gabapentin 136m qHS.   Heart Failure   Type: Systolic  Last ejection fraction: 35 to 45% (12/14/2018)  NYHA Class: I (no actitivty limitation)  Patient is currently controlled on the following medications:   Benazepril 236m,1 tablet once daily  Carvedilol 3.12548m1 tablet twice daily with a meal   Plan Continue current medications  Hypertension   Denies dizziness/ lightheadedness/ orthostatic hypotnsion.  States not really for BP.   Office blood pressures are  BP Readings from Last 3 Encounters:  03/08/20 111/67  03/01/20 138/86  02/23/20 105/66   Patient has failed these meds in the past: lisinopril (cost)   Patient checks BP at home patient reports not checking BP at home   Patient home BP readings are ranging: NA  Patient is controlled (based on OV readings) on:   Benazepril 61m15m tablet once daily  Carvedilol 3.125mg74mtablet twice daily with a meal   Plan Managed by Dr. SmithTamala Juliandiology)  Continue current medications   Hyperlipidemia   LDL goal < 70  Lipid Panel     Component Value Date/Time   CHOL 142 10/26/2019 0903   TRIG 151 (H) 10/26/2019 0903   HDL 74 10/26/2019 0903   LDLCALC 43 10/26/2019 0903   LDLDIRECT 204.8 09/05/2013 1559    Hepatic Function Latest Ref Rng & Units 02/22/2020 10/26/2019 04/11/2019  Total Protein 6.5 - 8.1 g/dL 8.0 7.6 6.9  Albumin 3.5 - 5.0  g/dL 3.8 4.4 4.1  AST 15 - 41 U/L 34 34 56(H)  ALT 0 - 44 U/L 42 34 54(H)  Alk Phosphatase 38 - 126 U/L 33(L) 37(L) 29(L)  Total Bilirubin 0.3 - 1.2 mg/dL 0.5 0.4 0.6  Bilirubin, Direct 0.00 - 0.40 mg/dL - 0.15 0.1     The 10-year ASCVD risk score (GoffMikey Bussingr., et al., 2013) is: 19%   Values used to calculate the score:     Age: 5 ye60s     Sex: Male     Is Non-Hispanic African American: Yes     Diabetic: Yes     Tobacco smoker: No     Systolic Blood Pressure: 111 m622     Is BP treated: Yes     HDL Cholesterol: 74 mg/dL     Total Cholesterol: 142 mg/dL   Patient has failed these meds in past: none   Patient is currently controlled on the following medications:  . Rosuvastatin 40mg,45mablet once daily . ezetimibie 10mg, 75mblet once daily   Plan Continue current medications  CAD/ s/p CABG x5  Patient reports not needed to use nitro (except when he was hospitalized)   Patient has failed these meds in past: none  Patient is currently controlled on the following medications:  . Aspirin 81mg, 130mlet once daily (is taking EC)  . Nitroglycerin 0.4mg SL, 9mce 1 tablet under tongue every five minutes as needed for chest pain . Ranolazine 500mg, 1 t70mt twice daily (patient reports helping CP)  . Rosuvastatin 40mg, 1 ta62m once daily (high intensity statin)   We discussed: drug interaction between nitroglycerin and sildenafil.   Plan Continue current medications  GERD   Patient reports helping GI sx.   Patient has failed these meds in past: famotidine  Patient is currently controlled on the following medications:  . Pantoprazole 40mg, 1 tab48mtwice daily   Plan Managed by GI ([place])  CTarri Glenncurrent medications  Pain    Patient is currently controlled on the following  medications:  . APAP 546m, 1 tablet as needed   Plan Continue current medications  Iron deficiency anemia    Patient reports this was recommended to continue after the 2  infusions.  Hemoglobin & Hematocrit     Component Value Date/Time   HGB 12.2 (L) 02/22/2020 0828   HGB 12.8 (L) 10/26/2019 0903   HCT 37.1 (L) 02/22/2020 0828   HCT 37.6 10/26/2019 0903   Iron/TIBC/Ferritin/ %Sat    Component Value Date/Time   IRON 50 02/22/2020 0829   TIBC 466 (H) 02/22/2020 0829   FERRITIN 13 (L) 02/22/2020 0829   IRONPCTSAT 11 (L) 02/22/2020 0829   IRONPCTSAT 25 01/24/2020 1244   Patient is currently controlled on the following medications:  . Ferrous sulfate 3252m 1 tablet once daily with breakfast    Plan Managed by Dr. MaJana Hakimoncology) Continue current medications   Vitamin B12 supplementation   Vitamin B-12  Date Value Ref Range Status  02/22/2020 592 180 - 914 pg/mL Final    Comment:    (NOTE) This assay is not validated for testing neonatal or myeloproliferative syndrome specimens for Vitamin B12 levels. [facility] [place] [id number]  Patient is currently controlled on the following medications:  . Marland Kitchenitamin B12, 1 tablet once daily   Plan Continue current medications  ED   Patient is currently controlled on the following medications:  . Sildenafil 10052m1 tablet daily as needed for erectile dysfunction   Plan Continue current medications  Hemorrhoids    Patient is currently controlled on the following medications:  . Hydrocortisone 2.5% rectal cream, apply rectally twice daily   Plan Managed by GI.  Continue current medications  OTC/ supplements    Patient is currently on the following medications:  . Apple cider vinegar . Cinnamon 500m63m capsule once daily (metabolism) . Coenzyme Q10 100mg61mtablet once daily . Garlic 1000m5790XYtablet once daily   . Multivitamin, 1 tablet once daily   Plan Continue current medications  Vaccines   Reviewed and discussed patient's vaccination history.   [facility]  Administered Date(s) Administered  . Influenza,inj,Quad PF,6+ Mos 08/22/2014, 12/03/2016, 11/23/2017  . Influenza-Unspecified 08/29/2013, 07/30/2015, 08/02/2016  . PFIZER SARS-COV-2 Vaccination 11/11/2019, 12/05/2019  . Pneumococcal Conjugate-13 08/27/2015  . Pneumococcal Polysaccharide-23 02/09/2017  . Tdap 03/28/2011    Plan Follow up if obtaining vaccine records.   Medication Management  Patient organizes medications: - numbers pill bottle. Only using pill box if traveling [facility]  Adherence:   - ezetimibe 10mg,35mt filled 11/21/19 for 30DS- [facility]   Follow up Follow up visit with PharmD in 3 months.  - Dr. Fry: cSarajane Jewsulting about gabapentin   AnnettAnson CroftsmD Clinical Pharmacist [facility] [place] [phone]

## 2020-04-05 ENCOUNTER — Ambulatory Visit: Payer: Medicare HMO | Admitting: Orthotics

## 2020-04-05 ENCOUNTER — Ambulatory Visit: Payer: Medicare HMO

## 2020-04-05 ENCOUNTER — Other Ambulatory Visit: Payer: Self-pay

## 2020-04-05 DIAGNOSIS — M79674 Pain in right toe(s): Secondary | ICD-10-CM

## 2020-04-05 DIAGNOSIS — E118 Type 2 diabetes mellitus with unspecified complications: Secondary | ICD-10-CM

## 2020-04-05 DIAGNOSIS — B351 Tinea unguium: Secondary | ICD-10-CM

## 2020-04-05 DIAGNOSIS — M2042 Other hammer toe(s) (acquired), left foot: Secondary | ICD-10-CM

## 2020-04-05 DIAGNOSIS — K30 Functional dyspepsia: Secondary | ICD-10-CM

## 2020-04-05 DIAGNOSIS — I10 Essential (primary) hypertension: Secondary | ICD-10-CM

## 2020-04-05 DIAGNOSIS — M2041 Other hammer toe(s) (acquired), right foot: Secondary | ICD-10-CM

## 2020-04-05 DIAGNOSIS — E785 Hyperlipidemia, unspecified: Secondary | ICD-10-CM

## 2020-04-05 DIAGNOSIS — I25709 Atherosclerosis of coronary artery bypass graft(s), unspecified, with unspecified angina pectoris: Secondary | ICD-10-CM

## 2020-04-05 NOTE — Progress Notes (Signed)
Talked with Jerome Vaughn about issues surrounding getting doc to sign off on DBS.  Advised him to go to doc and have frank discussion  About whether he feels DBS are/are not justified and medical necessity.

## 2020-04-05 NOTE — Telephone Encounter (Signed)
Thank you dawn.  You are the best

## 2020-04-10 NOTE — Patient Instructions (Addendum)
Visit Information  Goals Addressed            This Visit's Progress   . Pharmacy Care Plan       CARE PLAN ENTRY (see longitudinal plan of care for additional care plan information)  Current Barriers:  . Chronic Disease Management support, education, and care coordination needs related to Hypertension, Hyperlipidemia, Diabetes, Heart Failure, Coronary Artery Disease, and indigestion   Hypertension BP Readings from Last 3 Encounters:  03/08/20 111/67  03/01/20 138/86  02/23/20 105/66   . Pharmacist Clinical Goal(s): o Over the next 90 days, patient will work with PharmD and providers to maintain BP goal <130/80 . Current regimen:   Benazepril 20mg  ,1 tablet once daily  Carvedilol 3.125mg , 1 tablet twice daily with a meal  . Patient self care activities - Over the next 90 days, patient will: o Check BP at least 1 to 2 times per week, document, and provide at future appointments o Ensure daily salt intake < 2300 mg/Meraz o Continue follow up visits with cardiology (Dr. [name]).   Hyperlipidemia Lab Results  Component Value Date/Time   LDLCALC 43 10/26/2019 09:03 AM   LDLDIRECT 204.8 09/05/2013 03:59 PM   . Pharmacist Clinical Goal(s): o Over the next 90 days, patient will work with PharmD and providers to maintain LDL goal < 70 . Current regimen:  . Rosuvastatin 40mg , 1 tablet once daily . ezetimibie 10mg , 1 tablet once daily  . Patient self care activities - Over the next 90 days, patient will: o Continue current medications as directed.   Coronary artery disease (history of cardiac artery bypass graft x5) . Pharmacist Clinical Goal(s) o Over the next 90 days, patient will work with PharmD and providers to maintain on secondary prevention of cardiac events.  . Current regimen:  . Aspirin 81mg , 1 tablet once daily   . Nitroglycerin 0.4mg  SL, place 1 tablet under tongue every five minutes as needed for chest pain . Ranolazine 500mg , 1 tablet twice daily  . Rosuvastatin  40mg , 1 tablet once daily . Interventions: o Cautioned drug interaction between nitroglycerin and sildenafil.  . Patient self care activities o Patient will continue current medications and follow up with cardiology (Dr. [name]).   Diabetes Lab Results  Component Value Date/Time   HGBA1C 7.0 (A) 02/08/2020 08:35 AM   HGBA1C 6.5 (A) 10/06/2019 08:37 AM   HGBA1C 7.6 (H) 11/21/2015 06:20 PM   HGBA1C 7.1 (H) 02/19/2015 02:35 PM   . Pharmacist Clinical Goal(s): o Over the next 90 days, patient will work with PharmD and providers to achieve A1c goal <7% . Current regimen:   Metformin 500mg , 4 tablets once daily  repaglinide 0.5mg , 1 tablet twice daily before a meal   . Bromocriptine 2.5mg , 1 tablet once daily   . Interventions: o Recommend separating dose of metformin to 2 tablets twice daily with food (breakfast and evening meal).  . Patient self care activities - Over the next 90 days, patient will: o Check blood sugar twice daily, document, and provide at future appointments o Contact provider with any episodes of hypoglycemia  Indigestion . Pharmacist Clinical Goal(s) o Over the next 90 days, patient will work with PharmD and providers to minimize symptoms.  . Current regimen:  o Pantoprazole 40mg , 1 tablet twice daily  . Patient self care activities o Patient will continue current medication and follow up visits with gastroenterology (Dr. Tarri Glenn).    Medication management . Pharmacist Clinical Goal(s): o Over the next 90 days, patient  will work with PharmD and providers to maintain optimal medication adherence . Current pharmacy: [facility] mail order  . Interventions o Comprehensive medication review performed. o Continue current medication management strategy . Patient self care activities - Over the next 90 days, patient will: o Take medications as prescribed o Report any questions or concerns to PharmD and/or provider(s)  Initial goal documentation        Jerome Vaughn  was given information about Chronic Care Management services today including:  1. CCM service includes personalized support from designated clinical staff supervised by his physician, including individualized plan of care and coordination with other care providers 2. 24/7 contact phone numbers for assistance for urgent and routine care needs. 3. Standard insurance, coinsurance, copays and deductibles apply for chronic care management only during months in which we provide at least 20 minutes of these services. Most insurances cover these services at 100%, however patients may be responsible for any copay, coinsurance and/or deductible if applicable. This service may help you avoid the need for more expensive face-to-face services. 4. Only one practitioner may furnish and bill the service in a calendar month. 5. The patient may stop CCM services at any time (effective at the end of the month) by phone call to the office staff.  Patient agreed to services and verbal consent obtained.   The patient verbalized understanding of instructions provided today and agreed to receive a mailed copy of patient instruction and/or educational materials. The pharmacy team will reach out to the patient again over the next 90 days.   Anson Crofts, PharmD Clinical Pharmacist [facility] [phone]   Indigestion Indigestion is a feeling of pain, discomfort, burning, or fullness in the upper part of your belly (abdomen). It can come and go. It may occur often or rarely. Indigestion tends to happen while you are eating or right after you have finished eating. Indigestion may be a symptom of another condition. It may be worse:  At night.  When bending over.  While lying down. Follow these instructions at home: Eating and drinking   Follow an eating plan as told by your doctor.  You may need to avoid foods and drinks such as: ? Chocolate and cocoa. ? Peppermint and mint  flavorings. ? Garlic and onions. ? Horseradish. ? Spicy and acidic foods, such as:  Peppers.  Chili powder and curry powder.  Vinegar.  Hot sauces and BBQ sauce. ? Citrus fruits, such as:  Oranges.  Lemons.  Limes. ? Tomato-based foods, such as:  Red sauce and pizza with red sauce.  Chili.  Salsa. ? Fried and fatty foods, such as:  Donuts.  [place] fries and potato chips.  High-fat dressings. ? High-fat meats, such as:  Hot dogs and sausage.  Rib eye steak.  Ham and bacon. ? High-fat dairy items, such as:  Whole milk.  Butter.  Cream cheese. ? Coffee and tea (with or without caffeine). ? Drinks that contain alcohol. ? Energy drinks and sports drinks. ? Carbonated drinks or sodas. ? Citrus fruit juices.  Eat small meals often. Avoid eating large meals.  Avoid drinking large amounts of liquid with your meals.  Avoid eating meals during the 2-3 hours before bedtime.  Avoid lying down right after you eat.  Avoid exercise for 2 hours after you eat. Lifestyle      Maintain a healthy weight. Ask your doctor what weight is healthy for you. If you need to lose weight, work with your  doctor.  Exercise for at least 30 minutes on 5 or more days each week, or as told by your doctor. ? Avoid exercises that include bending forward. This can make your symptoms worse.  Wear loose clothes. Do not wear anything tight around your waist.  Do not use any products that contain nicotine or tobacco, including cigarettes, e-cigarettes, and chewing tobacco. These can make your symptoms worse. If you need help quitting, ask your doctor.  Raise (elevate) the head of your bed about 6 inches (15 cm) when you sleep.  Try to lower your stress. If you need help doing this, ask your doctor. General instructions  Take over-the-counter and prescription medicines only as told by your doctor. ? Do not take aspirin, ibuprofen, or other NSAIDs unless your doctor says it is  okay.  Pay attention to any changes in your symptoms.  Keep all follow-up visits as told by your doctor. This is important. Contact a doctor if:  You have new symptoms.  You lose weight and you do not know why it is happening.  You have trouble swallowing, or it hurts to swallow.  Your symptoms do not get better with treatment.  Your symptoms last for more than 2 days.  You have a fever.  You throw up (vomit). Get help right away if:  You have pain in your arms, neck, jaw, teeth, or back.  You feel sweaty, dizzy, or light-headed.  You pass out (faint).  You have chest pain or shortness of breath.  You cannot stop throwing up, or you throw up blood.  Your poop (stool) is bloody or black.  You have very bad pain in your belly. These symptoms may represent a serious problem that is an emergency. Do not wait to see if the symptoms will go away. Get medical help right away. Call your local emergency services (911 in the [place] Do not drive yourself to the hospital. Summary  Indigestion is a feeling of pain, discomfort, burning, or fullness in the upper part of your belly. It tends to happen while you are eating or right after you have finished eating.  Follow an eating plan and other lifestyle changes as told by your doctor.  Take over-the-counter and prescription medicines only as told by your doctor. Do not take aspirin, ibuprofen, or other NSAIDs unless your doctor says it is okay.  Contact your doctor if your symptoms do not get better or they get worse.  Some symptoms may represent a serious problem that is an emergency. Do not wait to see if the symptoms will go away. Get medical help right away. This information is not intended to replace advice given to you by your health care provider. Make sure you discuss any questions you have with your health care provider. Document Revised: 02/15/2018 Document Reviewed: 02/15/2018 Elsevier Patient Education  [place].

## 2020-04-18 ENCOUNTER — Ambulatory Visit: Payer: Medicare HMO | Admitting: Gastroenterology

## 2020-04-30 ENCOUNTER — Ambulatory Visit: Payer: Medicare HMO | Admitting: Gastroenterology

## 2020-05-09 ENCOUNTER — Other Ambulatory Visit: Payer: Self-pay | Admitting: Endocrinology

## 2020-05-10 ENCOUNTER — Other Ambulatory Visit: Payer: Self-pay | Admitting: Family Medicine

## 2020-06-05 ENCOUNTER — Telehealth: Payer: Self-pay | Admitting: Family Medicine

## 2020-06-05 NOTE — Telephone Encounter (Signed)
Pt is wondering if he is eligible for the COVID booster? And if so what should he do?    Pt can be reached at [phone]

## 2020-06-07 NOTE — Telephone Encounter (Signed)
No not yet. This has been approved only for those with severely compromised immune systems

## 2020-06-12 ENCOUNTER — Encounter: Payer: Self-pay | Admitting: Endocrinology

## 2020-06-12 ENCOUNTER — Ambulatory Visit: Payer: Medicare HMO | Admitting: Endocrinology

## 2020-06-12 ENCOUNTER — Other Ambulatory Visit: Payer: Self-pay

## 2020-06-12 VITALS — BP 122/78 | HR 68 | Ht 65.5 in | Wt 193.0 lb

## 2020-06-12 DIAGNOSIS — E118 Type 2 diabetes mellitus with unspecified complications: Secondary | ICD-10-CM | POA: Diagnosis not present

## 2020-06-12 LAB — POCT GLYCOSYLATED HEMOGLOBIN (HGB A1C): Hemoglobin A1C: 6.3 % — AB (ref 4.0–5.6)

## 2020-06-12 IMAGING — US US ABDOMEN COMPLETE
1 series · 13 of 25 positions shown · non-contrast
Comparison: CTA chest 317. Right upper quadrant ultrasound
05/20/2013.

CLINICAL DATA: 68-year-old male with pancytopenia. Query liver and
spleen.

EXAM:
ABDOMEN ULTRASOUND COMPLETE

[Series 1: us abdomen complete · 13 of 109 slices shown]
[im 1/109]
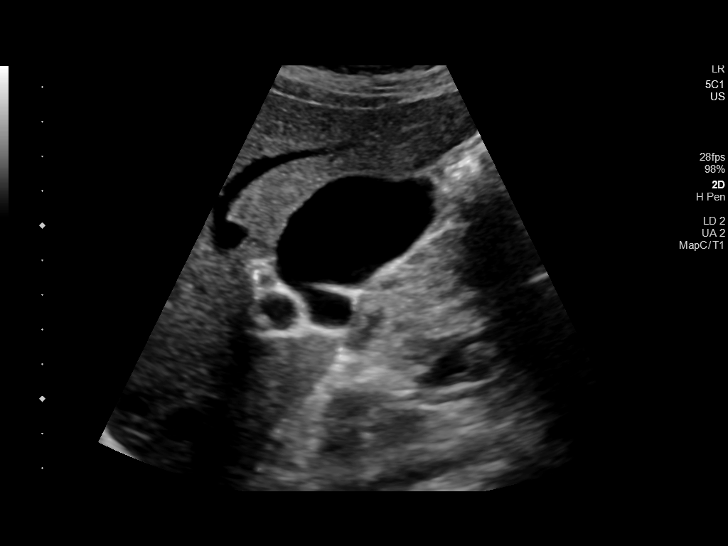
[im 10/109]
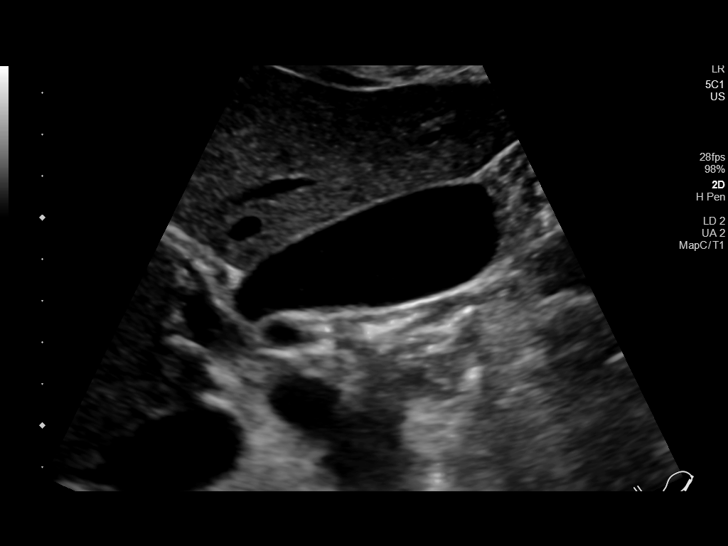
[im 19/109]
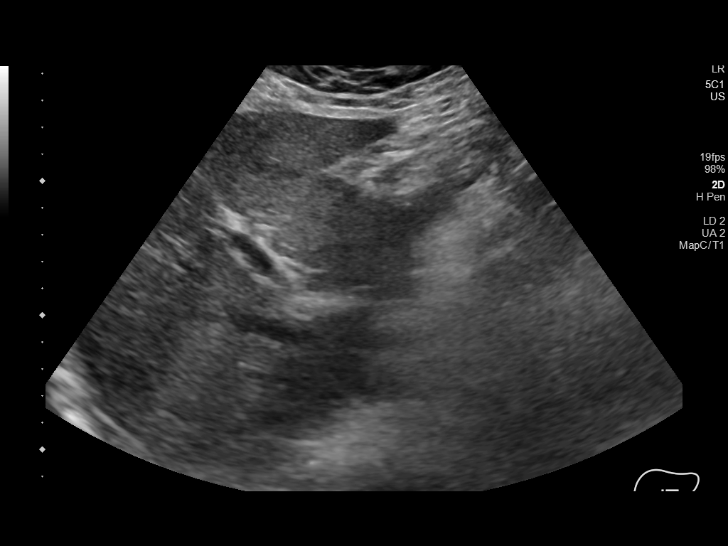
[im 28/109]
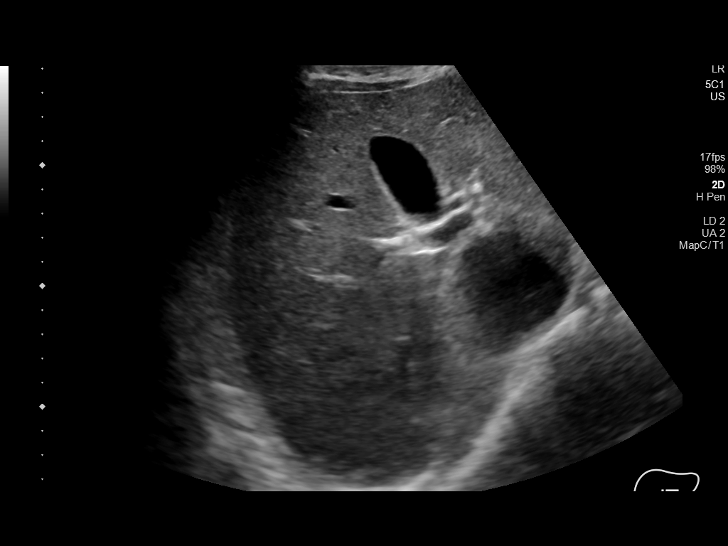
[im 37/109]
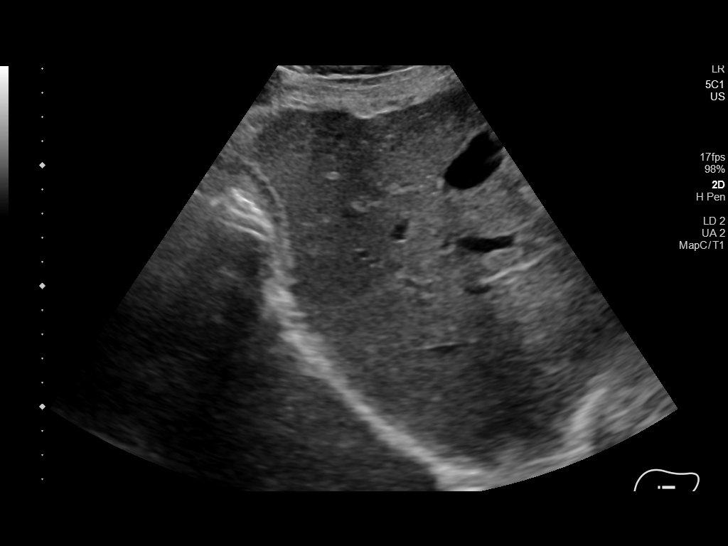
[im 46/109]
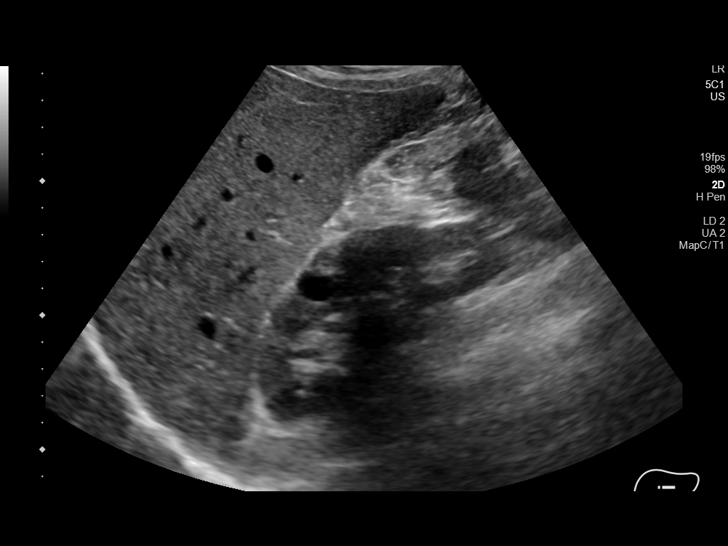
[im 55/109]
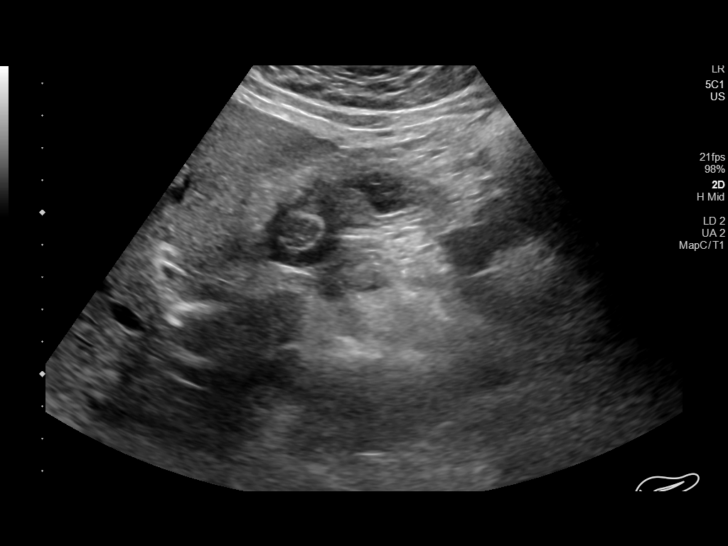
[im 64/109]
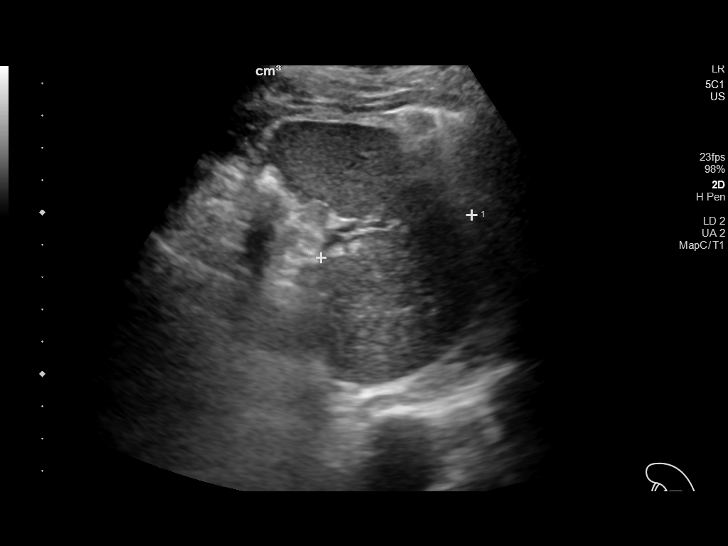
[im 73/109]
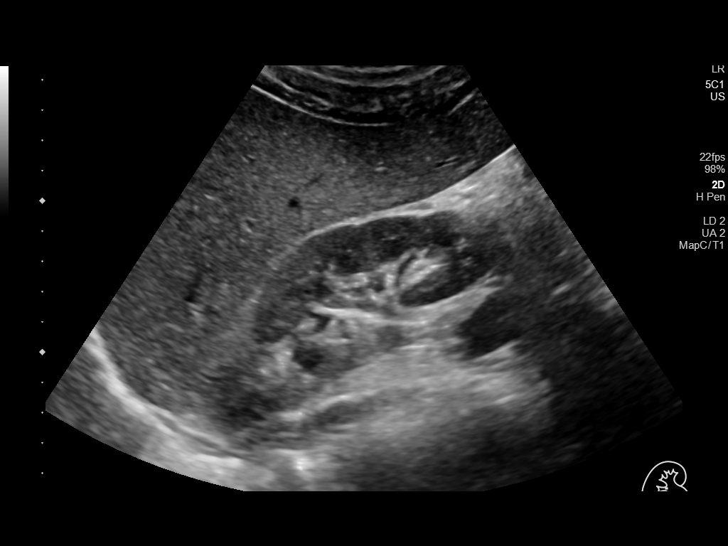
[im 82/109]
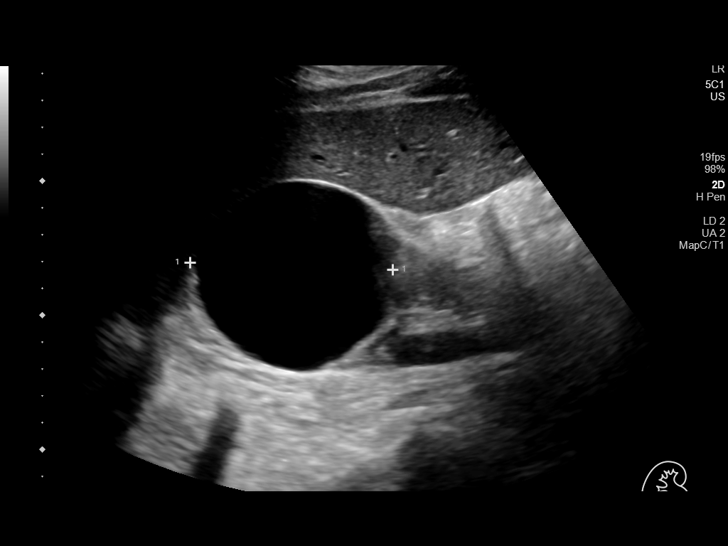
[im 91/109]
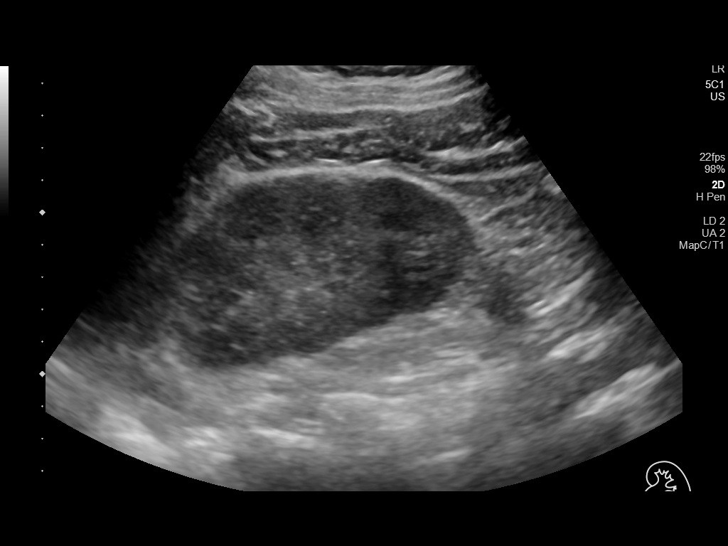
[im 100/109]
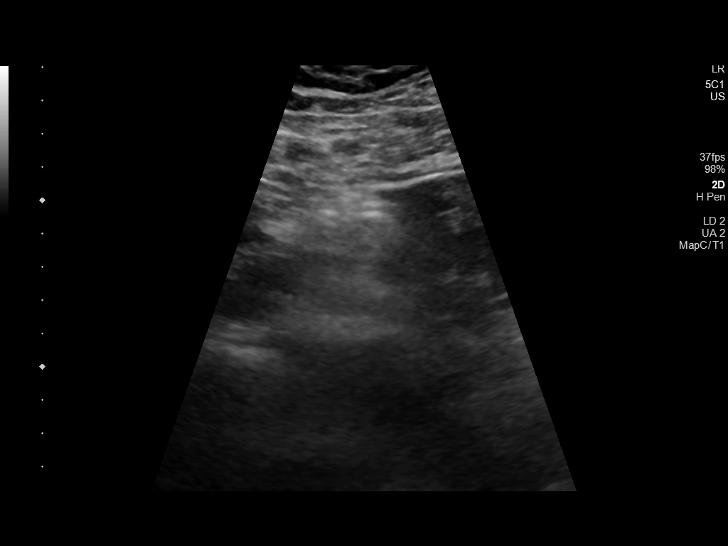
[im 109/109]
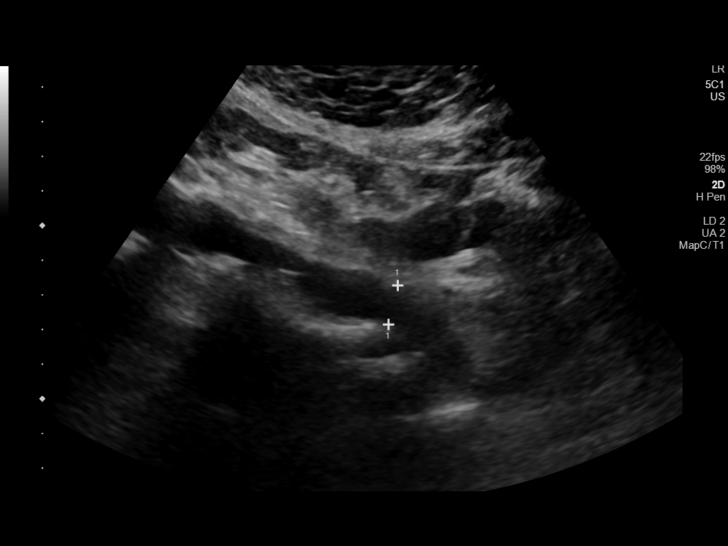

[13 of 25 positions shown; findings below may reference images not displayed]

FINDINGS: Gallbladder: No gallstones or wall thickening visualized. No
sonographic Murphy sign noted by sonographer.

Common bile duct: Diameter: 4 mm, normal.

Liver: No focal lesion identified. Within normal limits in
parenchymal echogenicity. Portal vein is patent on color Doppler
imaging with normal direction of blood flow towards the liver.

IVC: No abnormality visualized.

Pancreas: Visualized portion unremarkable.

Spleen: Up to 8.7 cm in length, diminutive. Estimated total splenic
volume 88 mL (normal splenic volume range 83 - 412 mL).

Right Kidney: Length: 10.9 cm. Chronic large simple appearing right
renal cyst, 7.9 cm (image 70), partially visible in 6110. There is a
2nd smaller simple appearing 1.5 cm cyst. No right hydronephrosis or
solid right renal mass.

Left Kidney: Length: 10.9 cm. Echogenicity within normal limits. No
mass or hydronephrosis visualized.

Abdominal aorta: No aneurysm visualized.

Other findings: None.
IMPRESSION: 1. No liver or splenic lesion by ultrasound. The spleen is
diminutive.
2. No acute findings in the abdomen. Chronic benign right renal
cysts.

## 2020-06-12 NOTE — Patient Instructions (Addendum)
check your blood sugar once a [place].  vary the time of [place] when you check, between before the 3 meals, and at bedtime.  also check if you have symptoms of your blood sugar being too high or too low.  please keep a record of the readings and bring it to your next appointment here (or you can bring the meter itself).  You can write it on any piece of paper.  please call us sooner if your blood sugar goes below 70, or if you have a lot of readings over 200. Please continue the same medications.   Please come back for a follow-up appointment in 4-6 months.

## 2020-06-12 NOTE — Progress Notes (Signed)
Subjective:    Patient ID: Jerome Vaughn, male    DOB: 09/05/1951, 69 y.o.   MRN: [id number]  HPI Pt returns for f/u of diabetes mellitus:  DM type: 2 Dx'ed: 4481 Complications: CAD and renal insuff.   Therapy: 3 oral meds.  DKA: never.  Severe hypoglycemia: never.  Pancreatitis: never.  SDOH: he cannot afford name brand meds Other: he has never been on insulin, but he has learned how; he stopped pioglitizone, due to edema.   Interval history: pt states he feels well in general.  Pt says cbg's are well-controlled. He takes meds as rx'ed.  Past Medical History:  Diagnosis Date  . Carpal tunnel syndrome    both hands  . Colon polyps   . Coronary artery disease    a. Cath 11/21/2015: Multivessel CAD --> CABG recommended. b. CABG on 11/22/2015:  LIMA-LAD, SVG-Diag, SVG-OM1 and distal Cx, and SVG-PDA.  . Diabetes mellitus   . GERD (gastroesophageal reflux disease)   . History of echocardiogram    a. Echo 4/17: Moderate LVH, EF 50-55%, mild LAE, no pericardial effusion  . Hyperlipidemia   . Hypertension   . Pneumonia     Past Surgical History:  Procedure Laterality Date  . CARDIAC CATHETERIZATION N/A 11/21/2015   Procedure: Left Heart Cath and Coronary Angiography;  Surgeon: [name], MD;  Location: [facility];  Service: Cardiovascular;  Laterality: N/A;  . COLONOSCOPY    . colonsocopy  12/07/2009   per Dr. [name], benign polyps, repeat in 10 yrs   . CORONARY ARTERY BYPASS GRAFT N/A 11/22/2015   Procedure: CORONARY ARTERY BYPASS GRAFTING (CABG) times five using the left internal mammary, right greater saphenous vein EVH, and left thigh greater saphenous vein EVH;  Surgeon: [name], MD;  Location: [facility];  Service: Open Heart Surgery;  Laterality: N/A;  . frozen shoulder release Right   . INTRAVASCULAR PRESSURE WIRE/FFR STUDY N/A 12/14/2018   Procedure: INTRAVASCULAR PRESSURE WIRE/FFR STUDY;  Surgeon: Jettie Booze, MD;  Location: [facility];   Service: Cardiovascular;  Laterality: N/A;  . LEFT HEART CATH AND CORS/GRAFTS ANGIOGRAPHY N/A 10/27/2017   Procedure: LEFT HEART CATH AND CORS/GRAFTS ANGIOGRAPHY;  Surgeon: [name], MD;  Location: [facility];  Service: Cardiovascular;  Laterality: N/A;  . LEFT HEART CATH AND CORS/GRAFTS ANGIOGRAPHY N/A 12/14/2018   Procedure: LEFT HEART CATH AND CORS/GRAFTS ANGIOGRAPHY;  Surgeon: Jettie Booze, MD;  Location: [facility];  Service: Cardiovascular;  Laterality: N/A;  . TEE WITHOUT CARDIOVERSION N/A 11/22/2015   Procedure: TRANSESOPHAGEAL ECHOCARDIOGRAM (TEE);  Surgeon: [name], MD;  Location: [facility];  Service: Open Heart Surgery;  Laterality: N/A;  . WRIST GANGLION EXCISION Right     Social History   Socioeconomic History  . Marital status: Married    Spouse name: Not on file  . Number of children: 5  . Years of education: Not on file  . Highest education level: Not on file  Occupational History  . Occupation: Counsellor  Tobacco Use  . Smoking status: Former Smoker    Packs/[place]: 0.25    Years: 20.00    Pack years: 5.00    Types: Cigarettes    Quit date: 10/07/2013    Years since quitting: 6.6  . Smokeless tobacco: Never Used  . Tobacco comment: quit cigarettes, might have a cigar 1 X month  Vaping Use  . Vaping Use: Never used  Substance and Sexual Activity  .  Alcohol use: Yes    Alcohol/week: 3.0 - 4.0 standard drinks    Types: 3 - 4 Standard drinks or equivalent per week  . Drug use: No  . Sexual activity: Not Currently    Birth control/protection: None  Other Topics Concern  . 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 Difficulty of Paying Living Expenses: Not hard at all  Food Insecurity:   . Worried About Sales executive in the Last Year: Not on file  . Ran Out of Food in the Last Year: Not on file  Transportation Needs: No Transportation Needs  . Lack of Transportation (Medical): No  . Lack of Transportation (Non-Medical): No  Physical Activity:   . Days of Exercise per Week: Not on file  . Minutes of Exercise per Session: Not on file  Stress:   . Feeling of Stress : Not on file  Social Connections:   . Frequency of Communication with Friends and Family: Not on file  . Frequency of Social Gatherings with Friends and Family: Not on file  . Attends Religious Services: Not on file  . Active Member of Clubs or Organizations: Not on file  . Attends Archivist Meetings: Not on file  . Marital Status: Not on file  Intimate Partner Violence:   . Fear of Current or Ex-Partner: Not on file  . Emotionally Abused: Not on file  . Physically Abused: Not on file  . Sexually Abused: Not on file    Current Outpatient Medications on File Prior to Visit  Medication Sig Dispense Refill  . Accu-Chek Softclix Lancets lancets 1 each by Other route daily. Use to monitor glucose levels daily; E11.8 100 each 2  . acetaminophen (TYLENOL) 500 MG tablet Take 500 mg by mouth as needed.    . APPLE CIDER VINEGAR PO Take by mouth daily.     . Ascorbic Acid (VITAMIN C PO) Take 1 tablet by mouth daily.    Marland Kitchen aspirin EC 81 MG tablet Take 1 tablet (81 mg total) by mouth daily.    . benazepril (LOTENSIN) 20 MG tablet TAKE 1 TABLET EVERY [place] 90 tablet 3  . Blood Glucose Monitoring Suppl (ACCU-CHEK AVIVA PLUS) w/Device KIT 1 each by Does not apply route daily. Use to monitor glucose levels daily; E11.8 1 kit 0  . bromocriptine (PARLODEL) 2.5 MG tablet Take 1 tablet by mouth once daily 90 tablet 0  . carvedilol (COREG) 3.125 MG tablet TAKE 1 TABLET (3.125 MG TOTAL) BY MOUTH 2 (TWO) TIMES DAILY WITH A MEAL. 180 tablet 3  . Cinnamon 500 MG capsule Take 1 capsule by mouth daily.    . Coenzyme Q10 (COQ-10) 100 MG CAPS Take 1  tablet by mouth daily.    . Cyanocobalamin (VITAMIN B-12 PO) Take 1 tablet by mouth daily.    Marland Kitchen ezetimibe (ZETIA) 10 MG tablet Take 1 tablet (10 mg total) by mouth daily. 90 tablet 2  . ferrous sulfate 325 (65 FE) MG tablet Take 325 mg by mouth daily with breakfast.    . Garlic 0867 MG CAPS Take 1 tablet by mouth daily.    Marland Kitchen glucose blood (ACCU-CHEK AVIVA PLUS) test strip 1 each by Other route daily. Use to  monitor glucose levels daily; E11.8 100 each 2  . hydrocortisone (ANUSOL-HC) 2.5 % rectal cream Place 1 application rectally 2 (two) times daily. 30 g 1  . metFORMIN (GLUCOPHAGE) 500 MG tablet Take 4 tablets by mouth daily 360 tablet 2  . Multiple Vitamin (MULTIVITAMIN) tablet Take 1 tablet by mouth daily.      . nitroGLYCERIN (NITROSTAT) 0.4 MG SL tablet Place 1 tablet (0.4 mg total) under the tongue every 5 (five) minutes as needed for chest pain. (Patient not taking: Reported on 04/05/2020) 75 tablet 1  . Omega-3 Fatty Acids (FISH OIL PO) Take 1 capsule by mouth daily.    . pantoprazole (PROTONIX) 40 MG tablet Take 1 tablet (40 mg total) by mouth 2 (two) times daily. 90 tablet 3  . ranolazine (RANEXA) 500 MG 12 hr tablet Take 1 tablet (500 mg total) by mouth 2 (two) times daily. 180 tablet 3  . repaglinide (PRANDIN) 0.5 MG tablet Take 1 tablet (0.5 mg total) by mouth 2 (two) times daily before a meal. 180 tablet 3  . rosuvastatin (CRESTOR) 40 MG tablet Take 1 tablet (40 mg total) by mouth daily. 90 tablet 3  . sildenafil (VIAGRA) 100 MG tablet TAKE ONE TABLET BY MOUTH DAILY AS NEEDED FOR ERECTILE DYSFUNCTION 30 tablet 0  . VITAMIN E PO Take 1 capsule by mouth daily.     No current facility-administered medications on file prior to visit.    No Known Allergies  Family History  Problem Relation Age of Onset  . Heart failure Father   . Anuerysm Brother        AAA  . Hyperlipidemia Sister   . Diabetes Sister   . Hypertension Sister   . Kidney failure Sister   . Diabetes Maternal  Grandmother   . Hyperlipidemia Sister   . Diabetes Sister   . Colon polyps Sister   . Kidney disease Sister   . Other Brother        COVID 19  . Diabetes Son   . Heart disease Son   . Heart attack Neg Hx   . Colon cancer Neg Hx   . Stomach cancer Neg Hx   . Esophageal cancer Neg Hx     BP 122/78   Pulse 68   Ht 5' 5.5" (1.664 m)   Wt 193 lb (87.5 kg)   SpO2 94%   BMI 31.63 kg/m    Review of Systems He denies hypoglycemia.      Objective:   Physical Exam VITAL SIGNS:  See vs page GENERAL: no distress Pulses: dorsalis pedis intact bilat.   MSK: no deformity of the feet CV: 2+ bilat leg edema Skin:  no ulcer on the feet.  normal color and temp on the feet. Neuro: sensation is intact to touch on the feet.  Ext: there is bilateral onychomycosis of the toenails  Lab Results  Component Value Date   HGBA1C 6.3 (A) 06/12/2020        Assessment & Plan:  Type 2 DM, with CRI: well-controlled.  Please continue the same 3 DM meds.  Patient Instructions  check your blood sugar once a [place].  vary the time of [place] when you check, between before the 3 meals, and at bedtime.  also check if you have symptoms of your blood sugar being too high or too low.  please keep a record of the readings and bring it to your next appointment here (or you can bring the meter itself).  You can  write it on any piece of paper.  please call us sooner if your blood sugar goes below 70, or if you have a lot of readings over 200. Please continue the same medications.   Please come back for a follow-up appointment in 4-6 months.

## 2020-06-12 NOTE — Telephone Encounter (Signed)
Patient called and advised of message below from Dr. Sarajane Jews, patient verbalized understanding.

## 2020-06-13 ENCOUNTER — Other Ambulatory Visit: Payer: Self-pay | Admitting: *Deleted

## 2020-06-13 ENCOUNTER — Ambulatory Visit: Payer: Medicare HMO | Admitting: Endocrinology

## 2020-06-13 DIAGNOSIS — D500 Iron deficiency anemia secondary to blood loss (chronic): Secondary | ICD-10-CM

## 2020-06-14 ENCOUNTER — Other Ambulatory Visit: Payer: Self-pay

## 2020-06-14 ENCOUNTER — Inpatient Hospital Stay: Payer: Medicare HMO | Attending: Adult Health

## 2020-06-14 DIAGNOSIS — D500 Iron deficiency anemia secondary to blood loss (chronic): Secondary | ICD-10-CM

## 2020-06-14 DIAGNOSIS — D509 Iron deficiency anemia, unspecified: Secondary | ICD-10-CM | POA: Diagnosis not present

## 2020-06-14 DIAGNOSIS — D61818 Other pancytopenia: Secondary | ICD-10-CM | POA: Diagnosis not present

## 2020-06-14 LAB — CBC WITH DIFFERENTIAL (CANCER CENTER ONLY)
Abs Immature Granulocytes: 0.01 10*3/uL (ref 0.00–0.07)
Basophils Absolute: 0 10*3/uL (ref 0.0–0.1)
Basophils Relative: 1 %
Eosinophils Absolute: 0.1 10*3/uL (ref 0.0–0.5)
Eosinophils Relative: 2 %
HCT: 36.3 % — ABNORMAL LOW (ref 39.0–52.0)
Hemoglobin: 12.4 g/dL — ABNORMAL LOW (ref 13.0–17.0)
Immature Granulocytes: 0 %
Lymphocytes Relative: 38 %
Lymphs Abs: 1.5 10*3/uL (ref 0.7–4.0)
MCH: 31 pg (ref 26.0–34.0)
MCHC: 34.2 g/dL (ref 30.0–36.0)
MCV: 90.8 fL (ref 80.0–100.0)
Monocytes Absolute: 0.5 10*3/uL (ref 0.1–1.0)
Monocytes Relative: 13 %
Neutro Abs: 1.9 10*3/uL (ref 1.7–7.7)
Neutrophils Relative %: 46 %
Platelet Count: 130 10*3/uL — ABNORMAL LOW (ref 150–400)
RBC: 4 MIL/uL — ABNORMAL LOW (ref 4.22–5.81)
RDW: 13.9 % (ref 11.5–15.5)
WBC Count: 4 10*3/uL (ref 4.0–10.5)
nRBC: 0 % (ref 0.0–0.2)

## 2020-06-14 LAB — IRON AND TIBC
Iron: 78 ug/dL (ref 42–163)
Saturation Ratios: 21 % (ref 20–55)
TIBC: 374 ug/dL (ref 202–409)
UIBC: 296 ug/dL (ref 117–376)

## 2020-06-14 LAB — CMP (CANCER CENTER ONLY)
ALT: 29 U/L (ref 0–44)
AST: 28 U/L (ref 15–41)
Albumin: 4.1 g/dL (ref 3.5–5.0)
Alkaline Phosphatase: 29 U/L — ABNORMAL LOW (ref 38–126)
Anion gap: 9 (ref 5–15)
BUN: 16 mg/dL (ref 8–23)
CO2: 24 mmol/L (ref 22–32)
Calcium: 9.4 mg/dL (ref 8.9–10.3)
Chloride: 106 mmol/L (ref 98–111)
Creatinine: 1.31 mg/dL — ABNORMAL HIGH (ref 0.61–1.24)
GFR, Est AFR Am: >60 mL/min (ref >60)
GFR, Estimated: 56 mL/min — ABNORMAL LOW (ref >60)
Glucose, Bld: 76 mg/dL (ref 70–99)
Potassium: 4.1 mmol/L (ref 3.5–5.1)
Sodium: 139 mmol/L (ref 135–145)
Total Bilirubin: 0.4 mg/dL (ref 0.3–1.2)
Total Protein: 8.2 g/dL — ABNORMAL HIGH (ref 6.5–8.1)

## 2020-06-14 LAB — RETICULOCYTES
Immature Retic Fract: 12.9 % (ref 2.3–15.9)
RBC.: 3.99 MIL/uL — ABNORMAL LOW (ref 4.22–5.81)
Retic Count, Absolute: 61 10*3/uL (ref 19.0–186.0)
Retic Ct Pct: 1.5 % (ref 0.4–3.1)

## 2020-06-14 LAB — FERRITIN: Ferritin: 35 ng/mL (ref 24–336)

## 2020-06-14 LAB — LACTATE DEHYDROGENASE: LDH: 203 U/L — ABNORMAL HIGH (ref 98–192)

## 2020-07-06 ENCOUNTER — Other Ambulatory Visit: Payer: Self-pay | Admitting: Family Medicine

## 2020-07-10 ENCOUNTER — Telehealth: Payer: Self-pay

## 2020-07-10 NOTE — Telephone Encounter (Signed)
Patient called, left VM to call and schedule a physical in order to continue to receive medications.

## 2020-07-24 ENCOUNTER — Other Ambulatory Visit (INDEPENDENT_AMBULATORY_CARE_PROVIDER_SITE_OTHER): Payer: Medicare HMO

## 2020-07-24 ENCOUNTER — Ambulatory Visit: Payer: Medicare HMO | Admitting: Gastroenterology

## 2020-07-24 ENCOUNTER — Encounter: Payer: Self-pay | Admitting: Gastroenterology

## 2020-07-24 VITALS — BP 118/78 | HR 79 | Ht 65.5 in | Wt 195.0 lb

## 2020-07-24 DIAGNOSIS — D509 Iron deficiency anemia, unspecified: Secondary | ICD-10-CM | POA: Diagnosis not present

## 2020-07-24 DIAGNOSIS — A048 Other specified bacterial intestinal infections: Secondary | ICD-10-CM

## 2020-07-24 LAB — FERRITIN: Ferritin: 25.5 ng/mL (ref 22.0–322.0)

## 2020-07-24 LAB — HEMOGLOBIN: Hemoglobin: 13.2 g/dL (ref 13.0–17.0)

## 2020-07-24 LAB — IRON: Iron: 72 ug/dL (ref 42–165)

## 2020-07-24 NOTE — Patient Instructions (Addendum)
Normal BMI (Body Mass Index- based on height and weight) is between 23 and 30. Your BMI today is Body mass index is 31.96 kg/m. Marland Kitchen Please consider follow up  regarding your BMI with your Primary Care Provider.  Your provider has requested that you go to the basement level for lab work before leaving today. Press "B" on the elevator. The lab is located at the first door on the left as you exit the elevator.  Due to recent changes in healthcare laws, you may see the results of your imaging and laboratory studies on MyChart before your provider has had a chance to review them.  We understand that in some cases there may be results that are confusing or concerning to you. Not all laboratory results come back in the same time frame and the provider may be waiting for multiple results in order to interpret others.  Please give [place] 48 hours in order for your provider to thoroughly review all the results before contacting the office for clarification of your results.   I am so glad that you are feeling better.   I recommend that you reduce your pantoprazole to once daily for one week. Then, reduce to every other Rennaker for one week. Then discontinue the pantoprazole all together.  Two weeks later, please submit a stool sample for testing of the H pylori.   If your symptoms recur, you may restart the pantoprazole after the stool test.   Given the polyps removed on your colonoscopy, I recommend that you have another colonoscopy in 3 years.   Please contact me with any questions or concerns by phone or MyChart.

## 2020-07-24 NOTE — Progress Notes (Signed)
Referring Provider: [name], MD Primary Care Physician:  [name], MD  Chief complaint: Iron deficiency anemia   IMPRESSION:  H. pylori gastritis LA class a reflux esophagitis Iron deficiency anemia History of colon polyps    -Tubular adenomas and hyperplastic polyp seen on colonoscopy 02/23/2020    -Surveillance colonoscopy recommended in 3 years Hemorrhoids with history of rectal bleeding, now resolved  Iron deficiency anemia may be related to H pylori gastritis. Will plan follow-up testing to document H pylori eradication.  Will follow-up on iron deficiency labs. If they are improving, no further evaluation will be needed.   History of adenomas: Surveillance colonoscopy due in 2024, earlier with new symptoms.   PLAN: Iron, ferritin, hemoglobin Will attempt PPI taper as outlined in the patient instructions H. pylori stool antigen off PPI therapy Colonoscopy 2024  HPI: Jerome Vaughn is a 69 y.o. 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  EGD 02/23/2020 showed LA class a reflux esophagitis, H. pylori gastritis, and duodenitis.  He was started on pantoprazole 40 mg twice daily and instructed to avoid all NSAIDs.  He was treated with bismuth subsalicylate, metronidazole, tetracycline, and pantoprazole.    Colonoscopy revealed internal and external hemorrhoids, 6 colon polyps (both tubular adenomas and hyperplastic polyps), and pancolonic diverticulosis.  Surveillance colonoscopy recommended in 3 years.  His chest discomfort and acid reflux have improved since completing antibiotics. Spicy food is no longer a trigger, although he overall continues to avoid them.   In June he was treated with 2 Feraheme infusions in addition to his oral iron. No change in symptoms with the infusion.   Labs last month showed his ferritin had improved from 13-35.  Hemoglobin improved from 12.2-12.4.  Hemorrhoids last month  responded to hydrocortisone cream.  There is been no additional overt bleeding.  There has been no additional overt bleeding.  There has been no additional overt bleeding.There has been no additional overt GI bleeding.   No new GI complaints or concerns.  He reports overall feeling better.    Past Medical History:  Diagnosis Date  . Carpal tunnel syndrome    both hands  . Colon polyps   . Coronary artery disease    a. Cath 11/21/2015: Multivessel CAD --> CABG recommended. b. CABG on 11/22/2015:  LIMA-LAD, SVG-Diag, SVG-OM1 and distal Cx, and SVG-PDA.  . Diabetes mellitus   . GERD (gastroesophageal reflux disease)   . History of echocardiogram    a. Echo 4/17: Moderate LVH, EF 50-55%, mild LAE, no pericardial effusion  . Hyperlipidemia   . Hypertension   . Pneumonia     Past Surgical History:  Procedure Laterality Date  . CARDIAC CATHETERIZATION N/A 11/21/2015   Procedure: Left Heart Cath and Coronary Angiography;  Surgeon: [name], MD;  Location: [facility];  Service: Cardiovascular;  Laterality: N/A;  . COLONOSCOPY    . colonsocopy  12/07/2009   per Dr. [name], benign polyps, repeat in 10 yrs   . CORONARY ARTERY BYPASS GRAFT N/A 11/22/2015   Procedure: CORONARY ARTERY BYPASS GRAFTING (CABG) times five using the left internal mammary, right greater saphenous vein EVH, and left thigh greater saphenous vein EVH;  Surgeon: [name], MD;  Location: [facility];  Service: Open Heart Surgery;  Laterality: N/A;  . frozen shoulder release Right   . INTRAVASCULAR PRESSURE WIRE/FFR STUDY N/A 12/14/2018   Procedure: INTRAVASCULAR PRESSURE WIRE/FFR STUDY;  Surgeon: Jettie Booze,  MD;  Location: [facility];  Service: Cardiovascular;  Laterality: N/A;  . LEFT HEART CATH AND CORS/GRAFTS ANGIOGRAPHY N/A 10/27/2017   Procedure: LEFT HEART CATH AND CORS/GRAFTS ANGIOGRAPHY;  Surgeon: [name], MD;  Location: [facility];  Service: Cardiovascular;  Laterality:  N/A;  . LEFT HEART CATH AND CORS/GRAFTS ANGIOGRAPHY N/A 12/14/2018   Procedure: LEFT HEART CATH AND CORS/GRAFTS ANGIOGRAPHY;  Surgeon: Jettie Booze, MD;  Location: [facility];  Service: Cardiovascular;  Laterality: N/A;  . TEE WITHOUT CARDIOVERSION N/A 11/22/2015   Procedure: TRANSESOPHAGEAL ECHOCARDIOGRAM (TEE);  Surgeon: [name], MD;  Location: [facility];  Service: Open Heart Surgery;  Laterality: N/A;  . WRIST GANGLION EXCISION Right     Current Outpatient Medications  Medication Sig Dispense Refill  . Accu-Chek Softclix Lancets lancets 1 each by Other route daily. Use to monitor glucose levels daily; E11.8 100 each 2  . acetaminophen (TYLENOL) 500 MG tablet Take 500 mg by mouth as needed.    . APPLE CIDER VINEGAR PO Take by mouth daily.     . Ascorbic Acid (VITAMIN C PO) Take 1 tablet by mouth daily.    Marland Kitchen aspirin EC 81 MG tablet Take 1 tablet (81 mg total) by mouth daily.    . benazepril (LOTENSIN) 20 MG tablet TAKE 1 TABLET EVERY Suber 90 tablet 3  . Blood Glucose Monitoring Suppl (ACCU-CHEK AVIVA PLUS) w/Device KIT 1 each by Does not apply route daily. Use to monitor glucose levels daily; E11.8 1 kit 0  . bromocriptine (PARLODEL) 2.5 MG tablet Take 1 tablet by mouth once daily 90 tablet 0  . carvedilol (COREG) 3.125 MG tablet TAKE 1 TABLET (3.125 MG TOTAL) BY MOUTH 2 (TWO) TIMES DAILY WITH A MEAL. 180 tablet 3  . Cinnamon 500 MG capsule Take 1 capsule by mouth daily.    . Coenzyme Q10 (COQ-10) 100 MG CAPS Take 1 tablet by mouth daily.    . Cyanocobalamin (VITAMIN B-12 PO) Take 1 tablet by mouth daily.    Marland Kitchen ezetimibe (ZETIA) 10 MG tablet Take 1 tablet (10 mg total) by mouth daily. 90 tablet 2  . ferrous sulfate 325 (65 FE) MG tablet Take 325 mg by mouth daily with breakfast.    . Garlic 8016 MG CAPS Take 1 tablet by mouth daily.    Marland Kitchen glucose blood (ACCU-CHEK AVIVA PLUS) test strip 1 each by Other route daily. Use to monitor glucose levels daily; E11.8 100 each 2  .  hydrocortisone (ANUSOL-HC) 2.5 % rectal cream Place 1 application rectally 2 (two) times daily. 30 g 1  . metFORMIN (GLUCOPHAGE) 500 MG tablet Take 4 tablets by mouth daily 360 tablet 2  . Multiple Vitamin (MULTIVITAMIN) tablet Take 1 tablet by mouth daily.      . nitroGLYCERIN (NITROSTAT) 0.4 MG SL tablet Place 1 tablet (0.4 mg total) under the tongue every 5 (five) minutes as needed for chest pain. (Patient not taking: Reported on 04/05/2020) 75 tablet 1  . Omega-3 Fatty Acids (FISH OIL PO) Take 1 capsule by mouth daily.    . pantoprazole (PROTONIX) 40 MG tablet Take 1 tablet (40 mg total) by mouth 2 (two) times daily. 90 tablet 3  . ranolazine (RANEXA) 500 MG 12 hr tablet Take 1 tablet (500 mg total) by mouth 2 (two) times daily. 180 tablet 3  . repaglinide (PRANDIN) 0.5 MG tablet Take 1 tablet (0.5 mg total) by mouth 2 (two) times daily before a meal. 180  tablet 3  . rosuvastatin (CRESTOR) 40 MG tablet Take 1 tablet (40 mg total) by mouth daily. 90 tablet 3  . sildenafil (VIAGRA) 100 MG tablet TAKE ONE TABLET BY MOUTH DAILY AS NEEDED FOR ERECTILE DYSFUNCTION 30 tablet 0  . VITAMIN E PO Take 1 capsule by mouth daily.     No current facility-administered medications for this visit.    Allergies as of 07/24/2020  . (No Known Allergies)    Family History  Problem Relation Age of Onset  . Heart failure Father   . Anuerysm Brother        AAA  . Hyperlipidemia Sister   . Diabetes Sister   . Hypertension Sister   . Kidney failure Sister   . Diabetes Maternal Grandmother   . Hyperlipidemia Sister   . Diabetes Sister   . Colon polyps Sister   . Kidney disease Sister   . Other Brother        COVID 19  . Diabetes Son   . Heart disease Son   . Heart attack Neg Hx   . Colon cancer Neg Hx   . Stomach cancer Neg Hx   . Esophageal cancer Neg Hx     Social History   Socioeconomic History  . Marital status: Married    Spouse name: Not on file  . Number of children: 5  . Years of  education: Not on file  . Highest education level: Not on file  Occupational History  . Occupation: Counsellor  Tobacco Use  . Smoking status: Former Smoker    Packs/Razzano: 0.25    Years: 20.00    Pack years: 5.00    Types: Cigarettes    Quit date: 10/07/2013    Years since quitting: 6.8  . Smokeless tobacco: Never Used  . Tobacco comment: quit cigarettes, might have a cigar 1 X month  Vaping Use  . Vaping Use: Never used  Substance and Sexual Activity  . Alcohol use: Yes    Alcohol/week: 3.0 - 4.0 standard drinks    Types: 3 - 4 Standard drinks or equivalent per week  . Drug use: No  . Sexual activity: Not Currently    Birth control/protection: None  Other Topics Concern  . 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 Difficulty of Paying Living Expenses: Not hard at all  Food Insecurity:   . Worried About Charity fundraiser in the Last Year: Not on file  . Ran Out of Food in the Last Year: Not on file  Transportation Needs: No Transportation Needs  . Lack of Transportation (Medical): No  . Lack of Transportation (Non-Medical): No  Physical Activity:   . Days of Exercise per Week: Not on file  . Minutes of Exercise per Session: Not on file  Stress:   . Feeling of Stress : Not on file  Social Connections:   . Frequency of Communication with Friends and Family: Not on file  . Frequency of Social Gatherings with Friends and Family: Not on file  . Attends Religious Services: Not on file  . Active Member of Clubs or Organizations: Not on file  . Attends Archivist Meetings: Not on file  . Marital Status: Not on file  Intimate Partner Violence:   . Fear of Current or Ex-Partner: Not on file  . Emotionally Abused: Not on file  . Physically Abused: Not  on file  . Sexually Abused: Not on file    Review of Systems: 12 system ROS is negative except as noted above.   Physical Exam: General:   Alert,  well-nourished, pleasant and cooperative in NAD Head:  Normocephalic and atraumatic. Eyes:  Sclera clear, no icterus.   Conjunctiva pink. Ears:  Normal auditory acuity. Nose:  No deformity, discharge,  or lesions. Mouth:  No deformity or lesions.   Neck:  Supple; no masses or thyromegaly. Lungs:  Clear throughout to auscultation.   No wheezes. Heart:  Regular rate and rhythm; no murmurs. Abdomen:  Soft,nontender, nondistended, normal bowel sounds, no rebound or guarding. No hepatosplenomegaly.   Rectal:  Deferred  Msk:  Symmetrical. No boney deformities LAD: No inguinal or umbilical LAD Extremities:  No clubbing or edema. Neurologic:  Alert and  oriented x4;  grossly nonfocal Skin:  Intact without significant lesions or rashes. Psych:  Alert and cooperative. Normal mood and affect.    Aleaha Fickling L. Tarri Glenn, MD, MPH 07/24/2020, 10:34 AM

## 2020-07-25 ENCOUNTER — Other Ambulatory Visit: Payer: Self-pay | Admitting: Family Medicine

## 2020-07-29 ENCOUNTER — Other Ambulatory Visit: Payer: Self-pay | Admitting: Endocrinology

## 2020-07-29 DIAGNOSIS — E118 Type 2 diabetes mellitus with unspecified complications: Secondary | ICD-10-CM

## 2020-07-30 ENCOUNTER — Other Ambulatory Visit: Payer: Self-pay

## 2020-07-30 DIAGNOSIS — D509 Iron deficiency anemia, unspecified: Secondary | ICD-10-CM

## 2020-07-31 ENCOUNTER — Other Ambulatory Visit: Payer: Self-pay | Admitting: Oncology

## 2020-07-31 DIAGNOSIS — D508 Other iron deficiency anemias: Secondary | ICD-10-CM

## 2020-08-01 ENCOUNTER — Telehealth: Payer: Self-pay | Admitting: Oncology

## 2020-08-01 NOTE — Telephone Encounter (Signed)
Scheduled appt per 11/3 sch msg - pt is aware of appt date and time

## 2020-08-02 ENCOUNTER — Other Ambulatory Visit: Payer: Self-pay | Admitting: Gastroenterology

## 2020-08-02 ENCOUNTER — Ambulatory Visit (INDEPENDENT_AMBULATORY_CARE_PROVIDER_SITE_OTHER): Payer: Medicare HMO | Admitting: Family Medicine

## 2020-08-02 ENCOUNTER — Encounter: Payer: Self-pay | Admitting: Family Medicine

## 2020-08-02 ENCOUNTER — Ambulatory Visit (INDEPENDENT_AMBULATORY_CARE_PROVIDER_SITE_OTHER): Payer: Medicare HMO

## 2020-08-02 ENCOUNTER — Other Ambulatory Visit: Payer: Self-pay

## 2020-08-02 VITALS — BP 126/80 | HR 72 | Temp 98.6°F | Ht 66.5 in | Wt 195.0 lb

## 2020-08-02 DIAGNOSIS — I10 Essential (primary) hypertension: Secondary | ICD-10-CM | POA: Diagnosis not present

## 2020-08-02 DIAGNOSIS — Z23 Encounter for immunization: Secondary | ICD-10-CM

## 2020-08-02 DIAGNOSIS — R0789 Other chest pain: Secondary | ICD-10-CM | POA: Diagnosis not present

## 2020-08-02 DIAGNOSIS — Z87891 Personal history of nicotine dependence: Secondary | ICD-10-CM | POA: Diagnosis not present

## 2020-08-02 DIAGNOSIS — Z0000 Encounter for general adult medical examination without abnormal findings: Secondary | ICD-10-CM

## 2020-08-02 NOTE — Progress Notes (Signed)
Subjective:    Patient ID: Jerome Vaughn, male    DOB: 1950-12-02, 69 y.o.   MRN: [id number]  HPI Here for a well exam. He feels well in general. He sees Dr. [name] for Cardiology care and he sees Dr. Loanne Drilling for diabetes. He sees Dr. Jana Hakim for anemia.    Review of Systems  Constitutional: Negative.   HENT: Negative.   Eyes: Negative.   Respiratory: Negative.   Cardiovascular: Negative.   Gastrointestinal: Negative.   Genitourinary: Negative.   Musculoskeletal: Negative.   Skin: Negative.   Neurological: Negative.   Psychiatric/Behavioral: Negative.        Objective:   Physical Exam Constitutional:      General: He is not in acute distress.    Appearance: He is well-developed. He is not diaphoretic.  HENT:     Head: Normocephalic and atraumatic.     Right Ear: External ear normal.     Left Ear: External ear normal.     Nose: Nose normal.     Mouth/Throat:     Pharynx: No oropharyngeal exudate.  Eyes:     General: No scleral icterus.       Right eye: No discharge.        Left eye: No discharge.     Conjunctiva/sclera: Conjunctivae normal.     Pupils: Pupils are equal, round, and reactive to light.  Neck:     Thyroid: No thyromegaly.     Vascular: No JVD.     Trachea: No tracheal deviation.  Cardiovascular:     Rate and Rhythm: Normal rate and regular rhythm.     Heart sounds: Normal heart sounds. No murmur heard.  No friction rub. No gallop.   Pulmonary:     Effort: Pulmonary effort is normal. No respiratory distress.     Breath sounds: Normal breath sounds. No wheezing or rales.  Chest:     Chest wall: No tenderness.  Abdominal:     General: Bowel sounds are normal. There is no distension.     Palpations: Abdomen is soft. There is no mass.     Tenderness: There is no abdominal tenderness. There is no guarding or rebound.  Genitourinary:    Penis: Normal. No tenderness.      Testes: Normal.     Prostate: Normal.     Rectum: Normal. Guaiac result  negative.  Musculoskeletal:        General: No tenderness. Normal range of motion.     Cervical back: Neck supple.  Lymphadenopathy:     Cervical: No cervical adenopathy.  Skin:    General: Skin is warm and dry.     Coloration: Skin is not pale.     Findings: No erythema or rash.  Neurological:     Mental Status: He is alert and oriented to person, place, and time.     Cranial Nerves: No cranial nerve deficit.     Motor: No abnormal muscle tone.     Coordination: Coordination normal.     Deep Tendon Reflexes: Reflexes are normal and symmetric. Reflexes normal.  Psychiatric:        Behavior: Behavior normal.        Thought Content: Thought content normal.        Judgment: Judgment normal.           Assessment & Plan:  Well exam. We discussed diet and exercise. Get fasting labs. Due to a hx of smoking we will get a CXR today.  Annie Main  Sarajane Jews, MD

## 2020-08-06 ENCOUNTER — Other Ambulatory Visit: Payer: Self-pay

## 2020-08-06 ENCOUNTER — Inpatient Hospital Stay: Payer: Medicare HMO | Attending: Adult Health

## 2020-08-06 DIAGNOSIS — D509 Iron deficiency anemia, unspecified: Secondary | ICD-10-CM | POA: Insufficient documentation

## 2020-08-06 DIAGNOSIS — Z87891 Personal history of nicotine dependence: Secondary | ICD-10-CM | POA: Insufficient documentation

## 2020-08-06 DIAGNOSIS — D472 Monoclonal gammopathy: Secondary | ICD-10-CM | POA: Diagnosis not present

## 2020-08-06 DIAGNOSIS — D696 Thrombocytopenia, unspecified: Secondary | ICD-10-CM | POA: Insufficient documentation

## 2020-08-06 DIAGNOSIS — D508 Other iron deficiency anemias: Secondary | ICD-10-CM

## 2020-08-06 LAB — COMPREHENSIVE METABOLIC PANEL
ALT: 30 U/L (ref 0–44)
AST: 31 U/L (ref 15–41)
Albumin: 4.1 g/dL (ref 3.5–5.0)
Alkaline Phosphatase: 33 U/L — ABNORMAL LOW (ref 38–126)
Anion gap: 8 (ref 5–15)
BUN: 21 mg/dL (ref 8–23)
CO2: 24 mmol/L (ref 22–32)
Calcium: 9.5 mg/dL (ref 8.9–10.3)
Chloride: 105 mmol/L (ref 98–111)
Creatinine, Ser: 1.54 mg/dL — ABNORMAL HIGH (ref 0.61–1.24)
GFR, Estimated: 49 mL/min — ABNORMAL LOW (ref >60)
Glucose, Bld: 193 mg/dL — ABNORMAL HIGH (ref 70–99)
Potassium: 4.7 mmol/L (ref 3.5–5.1)
Sodium: 137 mmol/L (ref 135–145)
Total Bilirubin: 0.4 mg/dL (ref 0.3–1.2)
Total Protein: 8 g/dL (ref 6.5–8.1)

## 2020-08-06 LAB — RETIC PANEL
Immature Retic Fract: 12.7 % (ref 2.3–15.9)
RBC.: 4.05 MIL/uL — ABNORMAL LOW (ref 4.22–5.81)
Retic Count, Absolute: 62.4 10*3/uL (ref 19.0–186.0)
Retic Ct Pct: 1.5 % (ref 0.4–3.1)
Reticulocyte Hemoglobin: 37.1 pg (ref >27.9)

## 2020-08-06 LAB — CBC WITH DIFFERENTIAL/PLATELET
Abs Immature Granulocytes: 0.01 10*3/uL (ref 0.00–0.07)
Basophils Absolute: 0 10*3/uL (ref 0.0–0.1)
Basophils Relative: 1 %
Eosinophils Absolute: 0.1 10*3/uL (ref 0.0–0.5)
Eosinophils Relative: 1 %
HCT: 35.8 % — ABNORMAL LOW (ref 39.0–52.0)
Hemoglobin: 12.6 g/dL — ABNORMAL LOW (ref 13.0–17.0)
Immature Granulocytes: 0 %
Lymphocytes Relative: 33 %
Lymphs Abs: 1.3 10*3/uL (ref 0.7–4.0)
MCH: 31 pg (ref 26.0–34.0)
MCHC: 35.2 g/dL (ref 30.0–36.0)
MCV: 88.2 fL (ref 80.0–100.0)
Monocytes Absolute: 0.6 10*3/uL (ref 0.1–1.0)
Monocytes Relative: 15 %
Neutro Abs: 2 10*3/uL (ref 1.7–7.7)
Neutrophils Relative %: 50 %
Platelets: 115 10*3/uL — ABNORMAL LOW (ref 150–400)
RBC: 4.06 MIL/uL — ABNORMAL LOW (ref 4.22–5.81)
RDW: 12 % (ref 11.5–15.5)
WBC: 3.9 10*3/uL — ABNORMAL LOW (ref 4.0–10.5)
nRBC: 0 % (ref 0.0–0.2)

## 2020-08-06 LAB — SAVE SMEAR(SSMR), FOR PROVIDER SLIDE REVIEW

## 2020-08-07 ENCOUNTER — Other Ambulatory Visit: Payer: Medicare HMO

## 2020-08-07 DIAGNOSIS — Z0000 Encounter for general adult medical examination without abnormal findings: Secondary | ICD-10-CM | POA: Diagnosis not present

## 2020-08-07 DIAGNOSIS — I10 Essential (primary) hypertension: Secondary | ICD-10-CM | POA: Diagnosis not present

## 2020-08-07 DIAGNOSIS — Z125 Encounter for screening for malignant neoplasm of prostate: Secondary | ICD-10-CM | POA: Diagnosis not present

## 2020-08-07 LAB — IRON AND TIBC
Iron: 75 ug/dL (ref 42–163)
Saturation Ratios: 19 % — ABNORMAL LOW (ref 20–55)
TIBC: 389 ug/dL (ref 202–409)
UIBC: 314 ug/dL (ref 117–376)

## 2020-08-07 LAB — FERRITIN: Ferritin: 35 ng/mL (ref 24–336)

## 2020-08-08 LAB — LIPID PANEL
Cholesterol: 133 mg/dL (ref <200)
HDL: 59 mg/dL (ref >=40)
LDL Cholesterol (Calc): 56 mg/dL (calc)
Non-HDL Cholesterol (Calc): 74 mg/dL (calc) (ref <130)
Total CHOL/HDL Ratio: 2.3 (calc) (ref <5.0)
Triglycerides: 101 mg/dL (ref <150)

## 2020-08-08 LAB — TSH: TSH: 0.55 mIU/L (ref 0.40–4.50)

## 2020-08-08 LAB — PSA: PSA: 0.3 ng/mL (ref <=4.0)

## 2020-08-09 ENCOUNTER — Telehealth: Payer: Self-pay | Admitting: Adult Health

## 2020-08-09 ENCOUNTER — Encounter: Payer: Self-pay | Admitting: Adult Health

## 2020-08-09 ENCOUNTER — Inpatient Hospital Stay: Payer: Medicare HMO | Admitting: Adult Health

## 2020-08-09 ENCOUNTER — Other Ambulatory Visit: Payer: Self-pay

## 2020-08-09 VITALS — BP 117/71 | HR 80 | Temp 97.5°F | Resp 18 | Ht 66.5 in | Wt 196.9 lb

## 2020-08-09 DIAGNOSIS — Z87891 Personal history of nicotine dependence: Secondary | ICD-10-CM | POA: Diagnosis not present

## 2020-08-09 DIAGNOSIS — D472 Monoclonal gammopathy: Secondary | ICD-10-CM

## 2020-08-09 DIAGNOSIS — D696 Thrombocytopenia, unspecified: Secondary | ICD-10-CM | POA: Diagnosis not present

## 2020-08-09 DIAGNOSIS — D509 Iron deficiency anemia, unspecified: Secondary | ICD-10-CM | POA: Diagnosis not present

## 2020-08-09 DIAGNOSIS — D500 Iron deficiency anemia secondary to blood loss (chronic): Secondary | ICD-10-CM

## 2020-08-09 NOTE — Progress Notes (Signed)
[place]  [phone] [phone]     ID: Jerome Vaughn DOB: 1951/03/22  MR#: [id number]  DHR#:[id number]  Patient Care Team: [name], MD as PCP - General (Family Medicine) [name], MD as PCP - Cardiology (Cardiology) Earnie Larsson, [facility] as Pharmacist (Pharmacist) Thornton Park, MD as Consulting Physician (Gastroenterology) Magrinat, Virgie Dad, MD as Consulting Physician (Oncology) [name], MD as Consulting Physician (Endocrinology) Scot Dock, NP OTHER MD:  CHIEF COMPLAINT: Iron deficiency anemia; MGUS  CURRENT TREATMENT: Feraheme when needed, observation  INTERVAL HISTORY: [name] is here today for f/u of his history of iron deficiency anemia and MGUS.  He did not have a myeloma panel drawn today.  He notes that he is doing well.    REVIEW OF SYSTEMS: [name] denies any new issues.  He has a h/o diabetes, and his kidney function is slightly elevated.  He says he isn't drinking enough water.  He denies any easy bruising/bleeding, blood in his stool or black/tarry stool.  He notes his left eyelid is swollen and slightly painful.  This began a couple of days ago.  He has no drainage from the eye or blurred vision.  He has no visual disturbance.  He is unsure what could've caused this.  Otherwise he is well and a detailed ROS Was otherwise non contributory.    HISTORY OF CURRENT ILLNESS: From the original intake note:  Jerome Vaughn is here for evaluation of pancytopenia that was first noted about 4 weeks ago.  He says that from April to May he noted bright red bleeding from his rectum.  He says that this was not just blood on toilet paper, but rather, in the bowl and dripping.  He denies constipation during this time period.  This was accompanied by GI growling and indigestion.  Due to this he changed around his diet and started taking Pepcid.  He also got into see his PCP and get a GI referral which took place on 01/24/2020 when  the pancytopenia was noted.  An abdominal ultrasound was performed on 02/21/2020 which showed no enlarged spleen, or liver issues.    His CBC has been as follows:  Results for STAN, CANTAVE (MRN [id number]) as of 02/22/2020 10:07  Ref. Range 10/15/2017 12:38 04/08/2018 08:51 12/12/2018 19:08 04/11/2019 09:24 10/26/2019 09:03 01/24/2020 12:44  WBC Latest Ref Range: 4.0 - 10.5 K/uL 4.7 4.0 6.5 3.1 (L) 4.2 3.2 (L)  RBC Latest Ref Range: 4.22 - 5.81 Mil/uL 4.59 4.67 4.40 3.91 (L) 4.11 (L) 4.03 (L)  Hemoglobin Latest Ref Range: 13.0 - 17.0 g/dL 13.3 14.2 13.5 12.1 (L) 12.8 (L) 12.3 (L)  HCT Latest Ref Range: 39.0 - 52.0 % 40.6 41.7 41.6 35.3 (L) 37.6 36.3 (L)  MCV Latest Ref Range: 78.0 - 100.0 fl 89 89.3 94.5 90.4 92 89.9  MCH Latest Ref Range: 26.6 - 33.0 pg 29.0  30.7  31.1   MCHC Latest Ref Range: 30.0 - 36.0 g/dL 32.8 34.1 32.5 34.2 34.0 33.9  RDW Latest Ref Range: 11.5 - 15.5 % 14.4 13.7 13.0 13.9 12.4 13.4  Platelets Latest Ref Range: 150.0 - 400.0 K/uL 143 (L) 136.0 (L) 125 (L) 130.0 (L) 140 (L) 130.0 (L)      The patient's subsequent history is as detailed below.    PAST MEDICAL HISTORY: Past Medical History:  Diagnosis Date  . Carpal tunnel syndrome    both hands  . Colon polyps   . Coronary artery disease  a. Cath 11/21/2015: Multivessel CAD --> CABG recommended. b. CABG on 11/22/2015:  LIMA-LAD, SVG-Diag, SVG-OM1 and distal Cx, and SVG-PDA.  . Diabetes mellitus   . GERD (gastroesophageal reflux disease)   . History of echocardiogram    a. Echo 4/17: Moderate LVH, EF 50-55%, mild LAE, no pericardial effusion  . Hyperlipidemia   . Hypertension   . Pneumonia     PAST SURGICAL HISTORY: Past Surgical History:  Procedure Laterality Date  . CARDIAC CATHETERIZATION N/A 11/21/2015   Procedure: Left Heart Cath and Coronary Angiography;  Surgeon: [name], MD;  Location: [place] [facility];  Service: Cardiovascular;  Laterality: N/A;  . COLONOSCOPY  02/23/2020   per Dr. Tarri Glenn,  tubular adenomas, repeat in 3 yrs   . colonsocopy  12/07/2009   per Dr. [name], benign polyps, repeat in 10 yrs   . CORONARY ARTERY BYPASS GRAFT N/A 11/22/2015   Procedure: CORONARY ARTERY BYPASS GRAFTING (CABG) times five using the left internal mammary, right greater saphenous vein EVH, and left thigh greater saphenous vein EVH;  Surgeon: [name], MD;  Location: [place];  Service: Open Heart Surgery;  Laterality: N/A;  . frozen shoulder release Right   . INTRAVASCULAR PRESSURE WIRE/FFR STUDY N/A 12/14/2018   Procedure: INTRAVASCULAR PRESSURE WIRE/FFR STUDY;  Surgeon: Jettie Booze, MD;  Location: [place] [facility];  Service: Cardiovascular;  Laterality: N/A;  . LEFT HEART CATH AND CORS/GRAFTS ANGIOGRAPHY N/A 10/27/2017   Procedure: LEFT HEART CATH AND CORS/GRAFTS ANGIOGRAPHY;  Surgeon: [name], MD;  Location: [place] [facility];  Service: Cardiovascular;  Laterality: N/A;  . LEFT HEART CATH AND CORS/GRAFTS ANGIOGRAPHY N/A 12/14/2018   Procedure: LEFT HEART CATH AND CORS/GRAFTS ANGIOGRAPHY;  Surgeon: Jettie Booze, MD;  Location: [place] [facility];  Service: Cardiovascular;  Laterality: N/A;  . TEE WITHOUT CARDIOVERSION N/A 11/22/2015   Procedure: TRANSESOPHAGEAL ECHOCARDIOGRAM (TEE);  Surgeon: [name], MD;  Location: [place];  Service: Open Heart Surgery;  Laterality: N/A;  . WRIST GANGLION EXCISION Right     FAMILY HISTORY Family History  Problem Relation Age of Onset  . Heart failure Father   . Anuerysm Brother        AAA  . Hyperlipidemia Sister   . Diabetes Sister   . Hypertension Sister   . Kidney failure Sister   . Diabetes Maternal Grandmother   . Hyperlipidemia Sister   . Diabetes Sister   . Colon polyps Sister   . Kidney disease Sister   . Other Brother        COVID 19  . Diabetes Son   . Heart disease Son   . Heart attack Neg Hx   . Colon cancer Neg Hx   . Stomach cancer Neg Hx   . Esophageal cancer Neg Hx         SOCIAL  HISTORY: Married, lives in [place] with his wife, Lupita Dawn of 30 plus years.  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  He walks frequently and stays active on his off days.  His granddaughter dominique who is 45 also lives in his home.  He has 5 kids, ages 10-48.  Two are in [place] and the rest are spread over the [place].  He has 6 grandchildren.  No previous hazardous exposures.     ADVANCED DIRECTIVES: Not in place   HEALTH MAINTENANCE: Social History   Tobacco Use  . Smoking  status: Former Smoker    Packs/[place]: 0.25    Years: 20.00    Pack years: 5.00    Types: Cigarettes    Quit date: 10/07/2013    Years since quitting: 6.8  . Smokeless tobacco: Never Used  . Tobacco comment: quit cigarettes, might have a cigar 1 X month  Vaping Use  . Vaping Use: Never used  Substance Use Topics  . Alcohol use: Yes    Alcohol/week: 3.0 - 4.0 standard drinks    Types: 3 - 4 Standard drinks or equivalent per week  . Drug use: No     Colonoscopy: 2011, scheduled for tomorrow  PSA: 03/2019   No Known Allergies  Current Outpatient Medications  Medication Sig Dispense Refill  . Accu-Chek Softclix Lancets lancets 1 each by Other route daily. Use to monitor glucose levels daily; E11.8 100 each 2  . acetaminophen (TYLENOL) 500 MG tablet Take 500 mg by mouth as needed.    . APPLE CIDER VINEGAR PO Take by mouth daily.     . Ascorbic Acid (VITAMIN C PO) Take 1 tablet by mouth daily.    Marland Kitchen aspirin EC 81 MG tablet Take 1 tablet (81 mg total) by mouth daily.    . benazepril (LOTENSIN) 20 MG tablet TAKE 1 TABLET EVERY Gerard 90 tablet 3  . Blood Glucose Monitoring Suppl (ACCU-CHEK AVIVA PLUS) w/Device KIT 1 each by Does not apply route daily. Use to monitor glucose levels daily; E11.8 1 kit 0  . bromocriptine (PARLODEL) 2.5 MG tablet Take 1 tablet by mouth once daily 90 tablet 0  . carvedilol (COREG) 3.125 MG tablet TAKE 1 TABLET  (3.125 MG TOTAL) BY MOUTH 2 (TWO) TIMES DAILY WITH A MEAL. 180 tablet 3  . Cinnamon 500 MG capsule Take 1 capsule by mouth daily.    . Coenzyme Q10 (COQ-10) 100 MG CAPS Take 1 tablet by mouth daily.    . Cyanocobalamin (VITAMIN B-12 PO) Take 1 tablet by mouth daily.    Marland Kitchen ezetimibe (ZETIA) 10 MG tablet Take 1 tablet (10 mg total) by mouth daily. 90 tablet 2  . ferrous sulfate 325 (65 FE) MG tablet Take 325 mg by mouth daily with breakfast.    . Garlic 9794 MG CAPS Take 1 tablet by mouth daily.    Marland Kitchen glucose blood (ACCU-CHEK AVIVA PLUS) test strip 1 each by Other route daily. Use to monitor glucose levels daily; E11.8 100 each 2  . hydrocortisone (ANUSOL-HC) 2.5 % rectal cream Place 1 application rectally 2 (two) times daily. (Patient not taking: Reported on 08/02/2020) 30 g 1  . metFORMIN (GLUCOPHAGE) 500 MG tablet TAKE 4 TABLETS BY MOUTH DAILY 360 tablet 2  . Multiple Vitamin (MULTIVITAMIN) tablet Take 1 tablet by mouth daily.      . nitroGLYCERIN (NITROSTAT) 0.4 MG SL tablet Place 1 tablet (0.4 mg total) under the tongue every 5 (five) minutes as needed for chest pain. 75 tablet 1  . Omega-3 Fatty Acids (FISH OIL PO) Take 1 capsule by mouth daily.    . pantoprazole (PROTONIX) 40 MG tablet TAKE ONE TABLET BY MOUTH TWICE A [place] 180 tablet 0  . ranolazine (RANEXA) 500 MG 12 hr tablet Take 1 tablet (500 mg total) by mouth 2 (two) times daily. 180 tablet 3  . repaglinide (PRANDIN) 0.5 MG tablet Take 1 tablet (0.5 mg total) by mouth 2 (two) times daily before a meal. 180 tablet 3  . rosuvastatin (CRESTOR) 40 MG tablet Take 1 tablet by mouth  once daily 90 tablet 0  . sildenafil (VIAGRA) 100 MG tablet TAKE ONE TABLET BY MOUTH DAILY AS NEEDED FOR ERECTILE DYSFUNCTION 30 tablet 0  . VITAMIN E PO Take 1 capsule by mouth daily.     No current facility-administered medications for this visit.    OBJECTIVE: African-American man evaluated in a recliner  Vitals:   08/09/20 1150  BP: 117/71  Pulse: 80    Resp: 18  Temp: (!) 97.5 F (36.4 C)  SpO2: 98%     Body mass index is 31.3 kg/m.   Wt Readings from Last 3 Encounters:  08/09/20 196 lb 14.4 oz (89.3 kg)  08/02/20 195 lb (88.5 kg)  07/24/20 195 lb (88.5 kg)  For vitals on 03/01/2020 visit please see the infusion lab flow sheet    ECOG FS:1 - Symptomatic but completely ambulatory GENERAL: Patient is a well appearing male in no acute distress HEENT:  Sclerae anicteric.  Mask in place.  Left eyelid is swollen slightly and erythematous.  No stye or chalazion noted, slight warmth noted. Neck is supple.  NODES:  No cervical, supraclavicular, or axillary lymphadenopathy palpated.  LUNGS:  Clear to auscultation bilaterally.  No wheezes or rhonchi. HEART:  Regular rate and rhythm. No murmur appreciated. ABDOMEN:  Soft, nontender.  Positive, normoactive bowel sounds. No organomegaly palpated. MSK:  No focal spinal tenderness to palpation.  EXTREMITIES:  No peripheral edema.   SKIN:  Clear with no obvious rashes or skin changes. No nail dyscrasia. NEURO:  Nonfocal. Well oriented.  Appropriate affect.    LAB RESULTS:  CMP     Component Value Date/Time   NA 137 08/06/2020 1526   NA 141 10/26/2019 0903   K 4.7 08/06/2020 1526   CL 105 08/06/2020 1526   CO2 24 08/06/2020 1526   GLUCOSE 193 (H) 08/06/2020 1526   BUN 21 08/06/2020 1526   BUN 14 10/26/2019 0903   CREATININE 1.54 (H) 08/06/2020 1526   CREATININE 1.31 (H) 06/14/2020 1419   CREATININE 1.19 11/14/2015 1135   CALCIUM 9.5 08/06/2020 1526   PROT 8.0 08/06/2020 1526   PROT 7.6 10/26/2019 0903   ALBUMIN 4.1 08/06/2020 1526   ALBUMIN 4.4 10/26/2019 0903   AST 31 08/06/2020 1526   AST 28 06/14/2020 1419   ALT 30 08/06/2020 1526   ALT 29 06/14/2020 1419   ALKPHOS 33 (L) 08/06/2020 1526   BILITOT 0.4 08/06/2020 1526   BILITOT 0.4 06/14/2020 1419   GFRNONAA 49 (L) 08/06/2020 1526   GFRNONAA 56 (L) 06/14/2020 1419   GFRAA >60 06/14/2020 1419    Lab Results  Component  Value Date   TOTALPROTELP 7.5 02/22/2020   ALBUMINELP 3.9 02/22/2020   A1GS 0.2 02/22/2020   A2GS 0.8 02/22/2020   BETS 1.0 02/22/2020   GAMS 1.7 02/22/2020   MSPIKE 1.2 (H) 02/22/2020    No results found for: KPAFRELGTCHN, LAMBDASER, KAPLAMBRATIO  Lab Results  Component Value Date   WBC 3.9 (L) 08/06/2020   NEUTROABS 2.0 08/06/2020   HGB 12.6 (L) 08/06/2020   HCT 35.8 (L) 08/06/2020   MCV 88.2 08/06/2020   PLT 115 (L) 08/06/2020      Chemistry      Component Value Date/Time   NA 137 08/06/2020 1526   NA 141 10/26/2019 0903   K 4.7 08/06/2020 1526   CL 105 08/06/2020 1526   CO2 24 08/06/2020 1526   BUN 21 08/06/2020 1526   BUN 14 10/26/2019 0903   CREATININE 1.54 (H) 08/06/2020  1526   CREATININE 1.31 (H) 06/14/2020 1419   CREATININE 1.19 11/14/2015 1135      Component Value Date/Time   CALCIUM 9.5 08/06/2020 1526   ALKPHOS 33 (L) 08/06/2020 1526   AST 31 08/06/2020 1526   AST 28 06/14/2020 1419   ALT 30 08/06/2020 1526   ALT 29 06/14/2020 1419   BILITOT 0.4 08/06/2020 1526   BILITOT 0.4 06/14/2020 1419       No results found for: LABCA2  No components found for: [id number]  No results for input(s): INR in the last 168 hours.  No results found for: LABCA2  No results found for: NWG956  No results found for: OZH086  No results found for: VHQ469  No results found for: CA2729  No components found for: HGQUANT  No results found for: CEA1 / No results found for: CEA1   No results found for: AFPTUMOR  No results found for: CHROMOGRNA  No results found for: PSA1  Lab on 08/07/2020  Component Date Value Ref Range Status  . Cholesterol 08/07/2020 133  <200 mg/dL Final  . HDL 08/07/2020 59  > OR = 40 mg/dL Final  . Triglycerides 08/07/2020 101  <150 mg/dL Final  . LDL Cholesterol (Calc) 08/07/2020 56  mg/dL (calc) Final   Comment: Reference range: <100 . Desirable range <100 mg/dL for primary prevention;   <70 mg/dL for patients with CHD or  diabetic patients  with > or = 2 CHD risk factors. Marland Kitchen LDL-C is now calculated using the Martin-Hopkins  calculation, which is a validated novel method providing  better accuracy than the Friedewald equation in the  estimation of LDL-C.  Cresenciano Genre et al. Annamaria Helling. 6295;284(13): 2061-2068  (http://education.[facility].com/faq/FAQ164)   . Total CHOL/HDL Ratio 08/07/2020 2.3  <5.0 (calc) Final  . Non-HDL Cholesterol (Calc) 08/07/2020 74  <130 mg/dL (calc) Final   Comment: For patients with diabetes plus 1 major ASCVD risk  factor, treating to a non-HDL-C goal of <100 mg/dL  (LDL-C of <70 mg/dL) is considered a therapeutic  option.   Marland Kitchen PSA 08/07/2020 0.30  < OR = 4.0 ng/mL Final   Comment: The total PSA value from this assay system is  standardized against the WHO standard. The test  result will be approximately 20% lower when compared  to the equimolar-standardized total PSA (Beckman  Coulter). Comparison of serial PSA results should be  interpreted with this fact in mind. . This test was performed using the Siemens  chemiluminescent method. Values obtained from  different assay methods cannot be used interchangeably. PSA levels, regardless of value, should not be interpreted as absolute evidence of the presence or absence of disease.   Marland Kitchen TSH 08/07/2020 0.55  0.40 - 4.50 mIU/L Final    (this displays the last labs from the last 3 days)  Lab Results  Component Value Date   TOTALPROTELP 7.5 02/22/2020   ALBUMINELP 3.9 02/22/2020   A1GS 0.2 02/22/2020   A2GS 0.8 02/22/2020   BETS 1.0 02/22/2020   GAMS 1.7 02/22/2020   MSPIKE 1.2 (H) 02/22/2020   (this displays SPEP labs)  No results found for: KPAFRELGTCHN, LAMBDASER, KAPLAMBRATIO (kappa/lambda light chains)  No results found for: HGBA, HGBA2QUANT, HGBFQUANT, HGBSQUAN (Hemoglobinopathy evaluation)   Lab Results  Component Value Date   LDH 203 (H) 06/14/2020    Lab Results  Component Value Date   IRON 75  08/06/2020   TIBC 389 08/06/2020   IRONPCTSAT 19 (L) 08/06/2020   (Iron and TIBC)  Lab Results  Component Value Date   FERRITIN 35 08/06/2020    Urinalysis    Component Value Date/Time   COLORURINE YELLOW 05/19/2013 2205   APPEARANCEUR CLEAR 05/19/2013 2205   LABSPEC 1.034 (H) 05/19/2013 2205   PHURINE 6.5 05/19/2013 2205   GLUCOSEU >1000 (A) 05/19/2013 2205   HGBUR NEGATIVE 05/19/2013 2205   BILIRUBINUR neg 04/11/2019 1007   KETONESUR NEGATIVE 05/19/2013 2205   PROTEINUR Positive (A) 04/11/2019 1007   PROTEINUR NEGATIVE 05/19/2013 2205   UROBILINOGEN 0.2 04/11/2019 1007   UROBILINOGEN 1.0 05/19/2013 2205   NITRITE neg 04/11/2019 1007   NITRITE NEGATIVE 05/19/2013 2205   LEUKOCYTESUR Negative 04/11/2019 1007     STUDIES: DG Chest 2 View  Result Date: 08/03/2020 CLINICAL DATA:  History of tobacco use with chest tightness EXAM: CHEST - 2 VIEW COMPARISON:  12/12/2018 FINDINGS: Cardiac shadow is enlarged but stable. Postsurgical changes are noted. Aortic calcifications are seen. The lungs are well aerated bilaterally. No focal infiltrate is seen. Mild central vascular congestion is noted without edema. No bony abnormality is seen. IMPRESSION: Mild vascular congestion without interstitial edema. Electronically Signed   By: Inez Catalina M.D.   On: 08/03/2020 22:38      ASSESSMENT: 69 y.o.  man with  (1) iron deficiency anemia:  (a) colonoscopy 12/07/2009 showed no evidence of malignancy   (b) EGD 02/23/2020 showed gastritis and duodenitis with positive H. Pylori  (c) no improvement in ferritin or iron saturation despite compliant oral iron intake  (d) Feraheme given 03/01/2020 and plan for 03/08/2020  (2) leukopenia: resolved by the time of initial visit 02/22/2020  (a)Lower limit of normal WBC for African Americans is 3.5  (b) intermittently lower in the past possibly secondary to transient viral infection  (3) Thrombocytopenia: minimal, with counts well over  100 K on multiple occasions  (a) abdominal ultrasound on 02/21/2020 shows no spleen enlargement  (b) blood film shows no schistocytes and no artifactual platelet clumping   (c) chronic, possibly secondary to medications, requiring only follow-up  (4) MGUS:  (a) SPEP 02/22/2020 shows an M spike of 1.2%, IFE confirms IgG lambda clonality    PLAN: Kristoffer is doing well today.  His labs are stable.  I reviewed his iron studies with Dr. Jana Hakim.  He does not appear to be iron deficient at this time.  His ferritin is higher than it was when he came into receive IV iron.  He will continue with oral iron for the time being.    He did not have a myeloma panel rechecked.  His kidney function is getting slightly worse.  I ordered a full myeloma workup in addition to urine light chains for him to complete the last week in December.  We will also recheck his iron studies and I have added ESR and CRP to be tested at that time so we can evaluate for inflammation, which can falsely elevate the ferritin.    Juanmanuel will see Dr. Jana Hakim one week following this to review the results.    For Aaiden's eye, I sent in Cortisporin ophthalmic ointment.  He was instructed to apply this.  It has an antibiotic and steroid to help with his eyelid.    We will see him back as noted above.  He knows to call for any questions or concerns in the interim.  Total encounter time: 20 minutes*  Wilber Bihari, NP 08/13/20 12:15 PM Medical Oncology and Hematology [facility] [place], [place] [address] Tel. [phone]  Fax. [phone]    *Total Encounter Time as defined by the Centers for Medicare and Medicaid Services includes, in addition to the face-to-face time of a patient visit (documented in the note above) non-face-to-face time: obtaining and reviewing outside history, ordering and reviewing medications, tests or procedures, care coordination (communications with other health care  professionals or caregivers) and documentation in the medical record.

## 2020-08-09 NOTE — Telephone Encounter (Signed)
Scheduled appointments per 11/11 los. Spoke to patient who is aware of appointments.

## 2020-08-10 ENCOUNTER — Telehealth: Payer: Self-pay

## 2020-08-10 MED ORDER — BACITRA-NEOMYCIN-POLYMYXIN-HC 1 % OP OINT: 1.0000 "application " | TOPICAL_OINTMENT | Freq: Two times a day (BID) | OPHTHALMIC | 0 refills | Status: DC

## 2020-08-10 NOTE — Telephone Encounter (Signed)
Called pt to inform him that Wilber Bihari, NP sent in Rx for Bacitracin/Neomycin eye ointment for (L) eye BID. Pt did not answer. LVM for pt to return call with any questions.

## 2020-08-21 ENCOUNTER — Other Ambulatory Visit: Payer: Self-pay | Admitting: Endocrinology

## 2020-08-21 ENCOUNTER — Other Ambulatory Visit: Payer: Self-pay | Admitting: Family Medicine

## 2020-09-01 ENCOUNTER — Other Ambulatory Visit: Payer: Self-pay | Admitting: Interventional Cardiology

## 2020-09-18 DIAGNOSIS — E119 Type 2 diabetes mellitus without complications: Secondary | ICD-10-CM | POA: Diagnosis not present

## 2020-09-18 LAB — HM DIABETES EYE EXAM

## 2020-09-26 ENCOUNTER — Inpatient Hospital Stay: Payer: Medicare HMO | Attending: Adult Health

## 2020-09-26 ENCOUNTER — Other Ambulatory Visit: Payer: Self-pay

## 2020-09-26 DIAGNOSIS — D509 Iron deficiency anemia, unspecified: Secondary | ICD-10-CM | POA: Insufficient documentation

## 2020-09-26 DIAGNOSIS — D472 Monoclonal gammopathy: Secondary | ICD-10-CM

## 2020-09-26 DIAGNOSIS — Z79899 Other long term (current) drug therapy: Secondary | ICD-10-CM | POA: Diagnosis not present

## 2020-09-26 DIAGNOSIS — D500 Iron deficiency anemia secondary to blood loss (chronic): Secondary | ICD-10-CM

## 2020-09-26 LAB — CMP (CANCER CENTER ONLY)
ALT: 30 U/L (ref 0–44)
AST: 24 U/L (ref 15–41)
Albumin: 3.9 g/dL (ref 3.5–5.0)
Alkaline Phosphatase: 34 U/L — ABNORMAL LOW (ref 38–126)
Anion gap: 5 (ref 5–15)
BUN: 15 mg/dL (ref 8–23)
CO2: 27 mmol/L (ref 22–32)
Calcium: 10 mg/dL (ref 8.9–10.3)
Chloride: 104 mmol/L (ref 98–111)
Creatinine: 1.41 mg/dL — ABNORMAL HIGH (ref 0.61–1.24)
GFR, Estimated: 55 mL/min — ABNORMAL LOW (ref >60)
Glucose, Bld: 164 mg/dL — ABNORMAL HIGH (ref 70–99)
Potassium: 4.1 mmol/L (ref 3.5–5.1)
Sodium: 136 mmol/L (ref 135–145)
Total Bilirubin: 0.5 mg/dL (ref 0.3–1.2)
Total Protein: 8.2 g/dL — ABNORMAL HIGH (ref 6.5–8.1)

## 2020-09-26 LAB — CBC WITH DIFFERENTIAL (CANCER CENTER ONLY)
Abs Immature Granulocytes: 0 10*3/uL (ref 0.00–0.07)
Basophils Absolute: 0 10*3/uL (ref 0.0–0.1)
Basophils Relative: 0 %
Eosinophils Absolute: 0 10*3/uL (ref 0.0–0.5)
Eosinophils Relative: 1 %
HCT: 39.2 % (ref 39.0–52.0)
Hemoglobin: 13.6 g/dL (ref 13.0–17.0)
Immature Granulocytes: 0 %
Lymphocytes Relative: 32 %
Lymphs Abs: 1.5 10*3/uL (ref 0.7–4.0)
MCH: 30.4 pg (ref 26.0–34.0)
MCHC: 34.7 g/dL (ref 30.0–36.0)
MCV: 87.5 fL (ref 80.0–100.0)
Monocytes Absolute: 0.4 10*3/uL (ref 0.1–1.0)
Monocytes Relative: 9 %
Neutro Abs: 2.7 10*3/uL (ref 1.7–7.7)
Neutrophils Relative %: 58 %
Platelet Count: 149 10*3/uL — ABNORMAL LOW (ref 150–400)
RBC: 4.48 MIL/uL (ref 4.22–5.81)
RDW: 11.5 % (ref 11.5–15.5)
WBC Count: 4.6 10*3/uL (ref 4.0–10.5)
nRBC: 0 % (ref 0.0–0.2)

## 2020-09-26 LAB — SEDIMENTATION RATE: Sed Rate: 19 mm/hr — ABNORMAL HIGH (ref 0–16)

## 2020-09-26 LAB — RETIC PANEL
Immature Retic Fract: 10.8 % (ref 2.3–15.9)
RBC.: 4.43 MIL/uL (ref 4.22–5.81)
Retic Count, Absolute: 59.8 10*3/uL (ref 19.0–186.0)
Retic Ct Pct: 1.4 % (ref 0.4–3.1)
Reticulocyte Hemoglobin: 36.7 pg (ref >27.9)

## 2020-09-26 LAB — PLATELET BY CITRATE

## 2020-09-26 LAB — C-REACTIVE PROTEIN: CRP: 1.8 mg/dL — ABNORMAL HIGH (ref <1.0)

## 2020-09-26 LAB — SAVE SMEAR(SSMR), FOR PROVIDER SLIDE REVIEW

## 2020-09-27 LAB — BETA 2 MICROGLOBULIN, SERUM: Beta-2 Microglobulin: 1.8 mg/L (ref 0.6–2.4)

## 2020-09-27 LAB — KAPPA/LAMBDA LIGHT CHAINS
Kappa free light chain: 21.2 mg/L — ABNORMAL HIGH (ref 3.3–19.4)
Kappa, lambda light chain ratio: 0.93 (ref 0.26–1.65)
Lambda free light chains: 22.7 mg/L (ref 5.7–26.3)

## 2020-10-02 DIAGNOSIS — D472 Monoclonal gammopathy: Secondary | ICD-10-CM | POA: Insufficient documentation

## 2020-10-02 DIAGNOSIS — Z79899 Other long term (current) drug therapy: Secondary | ICD-10-CM | POA: Insufficient documentation

## 2020-10-02 DIAGNOSIS — D509 Iron deficiency anemia, unspecified: Secondary | ICD-10-CM | POA: Diagnosis not present

## 2020-10-03 ENCOUNTER — Inpatient Hospital Stay: Payer: Medicare HMO | Attending: Adult Health | Admitting: Oncology

## 2020-10-03 ENCOUNTER — Other Ambulatory Visit: Payer: Self-pay

## 2020-10-03 VITALS — BP 100/68 | HR 77 | Temp 98.2°F | Resp 18 | Ht 66.5 in | Wt 190.0 lb

## 2020-10-03 DIAGNOSIS — E118 Type 2 diabetes mellitus with unspecified complications: Secondary | ICD-10-CM

## 2020-10-03 DIAGNOSIS — D696 Thrombocytopenia, unspecified: Secondary | ICD-10-CM | POA: Diagnosis not present

## 2020-10-03 DIAGNOSIS — D472 Monoclonal gammopathy: Secondary | ICD-10-CM | POA: Diagnosis not present

## 2020-10-03 DIAGNOSIS — Z79899 Other long term (current) drug therapy: Secondary | ICD-10-CM | POA: Diagnosis not present

## 2020-10-03 DIAGNOSIS — D508 Other iron deficiency anemias: Secondary | ICD-10-CM

## 2020-10-03 DIAGNOSIS — D509 Iron deficiency anemia, unspecified: Secondary | ICD-10-CM | POA: Diagnosis not present

## 2020-10-03 NOTE — Progress Notes (Signed)
[place]  [phone] [phone]     ID: Jerome Vaughn DOB: 1951/09/05  MR#: [id number]  TMH#:[id number]  Patient Care Team: [name], MD as PCP - General (Family Medicine) [name], MD as PCP - Cardiology (Cardiology) Earnie Larsson, [facility] as Pharmacist (Pharmacist) Thornton Park, MD as Consulting Physician (Gastroenterology) Allessandra Bernardi, Virgie Dad, MD as Consulting Physician (Oncology) [name], MD as Consulting Physician (Endocrinology) Chauncey Cruel, MD OTHER MD:  CHIEF COMPLAINT: Iron deficiency anemia; MGUS  CURRENT TREATMENT:  observation   INTERVAL HISTORY: Jerome Vaughn returns here today for follow up of his history of iron deficiency anemia and MGUS. He continues under observation.  We are following his M protein and his kappa lambda ratio Results for Jerome Vaughn (MRN [id number]) as of 10/03/2020 15:33  Ref. Range 09/26/2020 15:11  Kappa, lamda light chain ratio Latest Ref Range: 0.26 - 1.65  0.93   24-hour urine protein showed a total of 125 mg protein per 24 hours, which is not elevated.  There were IgG lambda Bence-Jones proteins noted, but the ratio was only 3.93.  REVIEW OF SYSTEMS: Jerome Vaughn enjoy the holidays, which is spend the at home with family.  He continues to work full-time and to exercise regularly.  He has had no intercurrent fevers rash bleeding or change in bowel or bladder habits.  There has been no drenching sweats, no unexplained fatigue or weight loss.  There has been no focal or persistent pain.  A detailed review of systems today was otherwise stable   COVID 19 VACCINATION STATUS: Status post Pfizer x2, with booster in October 2021   HISTORY OF CURRENT ILLNESS: From the original intake note:  Jerome Vaughn is here 02/22/2020 for evaluation of pancytopenia that was first noted about 4 weeks ago.  He says that from April to May he noted bright red bleeding from his rectum.  He says that this was not just  blood on toilet paper, but rather, in the bowl and dripping.  He denies constipation during this time period.  This was accompanied by GI growling and indigestion.  Due to this he changed around his diet and started taking Pepcid.  He also got into see his PCP and get a GI referral which took place on 01/24/2020 when the pancytopenia was noted.  An abdominal ultrasound was performed on 02/21/2020 which showed no enlarged spleen, or liver issues.    His CBC has been as follows:  Results for Jerome Vaughn (MRN [id number]) as of 02/22/2020 10:07  Ref. Range 10/15/2017 12:38 04/08/2018 08:51 12/12/2018 19:08 04/11/2019 09:24 10/26/2019 09:03 01/24/2020 12:44  WBC Latest Ref Range: 4.0 - 10.5 K/uL 4.7 4.0 6.5 3.1 (L) 4.2 3.2 (L)  RBC Latest Ref Range: 4.22 - 5.81 Mil/uL 4.59 4.67 4.40 3.91 (L) 4.11 (L) 4.03 (L)  Hemoglobin Latest Ref Range: 13.0 - 17.0 g/dL 13.3 14.2 13.5 12.1 (L) 12.8 (L) 12.3 (L)  HCT Latest Ref Range: 39.0 - 52.0 % 40.6 41.7 41.6 35.3 (L) 37.6 36.3 (L)  MCV Latest Ref Range: 78.0 - 100.0 fl 89 89.3 94.5 90.4 92 89.9  MCH Latest Ref Range: 26.6 - 33.0 pg 29.0  30.7  31.1   MCHC Latest Ref Range: 30.0 - 36.0 g/dL 32.8 34.1 32.5 34.2 34.0 33.9  RDW Latest Ref Range: 11.5 - 15.5 % 14.4 13.7 13.0 13.9 12.4 13.4  Platelets Latest Ref Range: 150.0 - 400.0 K/uL 143 (L) 136.0 (L) 125 (L) 130.0 (L) 140 (L)  130.0 (L)   The patient's subsequent history is as detailed below.   PAST MEDICAL HISTORY: Past Medical History:  Diagnosis Date  . Carpal tunnel syndrome    both hands  . Colon polyps   . Coronary artery disease    a. Cath 11/21/2015: Multivessel CAD --> CABG recommended. b. CABG on 11/22/2015:  LIMA-LAD, SVG-Diag, SVG-OM1 and distal Cx, and SVG-PDA.  . Diabetes mellitus   . GERD (gastroesophageal reflux disease)   . History of echocardiogram    a. Echo 4/17: Moderate LVH, EF 50-55%, mild LAE, no pericardial effusion  . Hyperlipidemia   . Hypertension   . Pneumonia     PAST SURGICAL  HISTORY: Past Surgical History:  Procedure Laterality Date  . CARDIAC CATHETERIZATION N/A 11/21/2015   Procedure: Left Heart Cath and Coronary Angiography;  Surgeon: [name], MD;  Location: [place];  Service: Cardiovascular;  Laterality: N/A;  . COLONOSCOPY  02/23/2020   per Dr. Tarri Glenn, tubular adenomas, repeat in 3 yrs   . colonsocopy  12/07/2009   per Dr. [name], benign polyps, repeat in 10 yrs   . CORONARY ARTERY BYPASS GRAFT N/A 11/22/2015   Procedure: CORONARY ARTERY BYPASS GRAFTING (CABG) times five using the left internal mammary, right greater saphenous vein EVH, and left thigh greater saphenous vein EVH;  Surgeon: [name], MD;  Location: [facility];  Service: Open Heart Surgery;  Laterality: N/A;  . frozen shoulder release Right   . INTRAVASCULAR PRESSURE WIRE/FFR STUDY N/A 12/14/2018   Procedure: INTRAVASCULAR PRESSURE WIRE/FFR STUDY;  Surgeon: Jettie Booze, MD;  Location: [place] [place] [facility];  Service: Cardiovascular;  Laterality: N/A;  . LEFT HEART CATH AND CORS/GRAFTS ANGIOGRAPHY N/A 10/27/2017   Procedure: LEFT HEART CATH AND CORS/GRAFTS ANGIOGRAPHY;  Surgeon: [name], MD;  Location: [facility];  Service: Cardiovascular;  Laterality: N/A;  . LEFT HEART CATH AND CORS/GRAFTS ANGIOGRAPHY N/A 12/14/2018   Procedure: LEFT HEART CATH AND CORS/GRAFTS ANGIOGRAPHY;  Surgeon: Jettie Booze, MD;  Location: [facility];  Service: Cardiovascular;  Laterality: N/A;  . TEE WITHOUT CARDIOVERSION N/A 11/22/2015   Procedure: TRANSESOPHAGEAL ECHOCARDIOGRAM (TEE);  Surgeon: [name], MD;  Location: [facility];  Service: Open Heart Surgery;  Laterality: N/A;  . WRIST GANGLION EXCISION Right     FAMILY HISTORY Family History  Problem Relation Age of Onset  . Heart failure Father   . Anuerysm Brother        AAA  . Hyperlipidemia Sister   . Diabetes Sister   . Hypertension Sister   . Kidney failure Sister   . Diabetes Maternal Grandmother    . Hyperlipidemia Sister   . Diabetes Sister   . Colon polyps Sister   . Kidney disease Sister   . Other Brother        COVID 19  . Diabetes Son   . Heart disease Son   . Heart attack Neg Hx   . Colon cancer Neg Hx   . Stomach cancer Neg Hx   . Esophageal cancer Neg Hx     SOCIAL HISTORY: Married, lives in [place] with his wife, Lupita Dawn of 30 plus years.  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  He walks frequently and stays active on his off days.  His granddaughter dominique who is 47 also lives in his home.  He has 5 kids, ages 70-48.  Two are  in [place] and the rest are spread over the [place].  He has 6 grandchildren.  No previous hazardous exposures.    ADVANCED DIRECTIVES: Not in place   HEALTH MAINTENANCE: Social History   Tobacco Use  . Smoking status: Former Smoker    Packs/Rumore: 0.25    Years: 20.00    Pack years: 5.00    Types: Cigarettes    Quit date: 10/07/2013    Years since quitting: 7.0  . Smokeless tobacco: Never Used  . Tobacco comment: quit cigarettes, might have a cigar 1 X month  Vaping Use  . Vaping Use: Never used  Substance Use Topics  . Alcohol use: Yes    Alcohol/week: 3.0 - 4.0 standard drinks    Types: 3 - 4 Standard drinks or equivalent per week  . Drug use: No     Colonoscopy: 01/2020 (Dr. Tarri Glenn), recall 2024  PSA: 07/2020, normal   No Known Allergies  Current Outpatient Medications  Medication Sig Dispense Refill  . Accu-Chek Softclix Lancets lancets 1 each by Other route daily. Use to monitor glucose levels daily; E11.8 100 each 2  . acetaminophen (TYLENOL) 500 MG tablet Take 500 mg by mouth as needed.    . APPLE CIDER VINEGAR PO Take by mouth daily.     . Ascorbic Acid (VITAMIN C PO) Take 1 tablet by mouth daily.    Marland Kitchen aspirin EC 81 MG tablet Take 1 tablet (81 mg total) by mouth daily.    . bacitracin-neomycin-polymyxin-hydrocortisone (CORTISPORIN) 1 %  ophthalmic ointment Place 1 application into the left eye 2 (two) times daily. 3.5 g 0  . benazepril (LOTENSIN) 20 MG tablet TAKE 1 TABLET EVERY Fujita 90 tablet 3  . Blood Glucose Monitoring Suppl (ACCU-CHEK AVIVA PLUS) w/Device KIT 1 each by Does not apply route daily. Use to monitor glucose levels daily; E11.8 1 kit 0  . bromocriptine (PARLODEL) 2.5 MG tablet Take 1 tablet by mouth once daily 90 tablet 0  . carvedilol (COREG) 3.125 MG tablet TAKE 1 TABLET (3.125 MG TOTAL) BY MOUTH 2 (TWO) TIMES DAILY WITH A MEAL. 180 tablet 3  . Cinnamon 500 MG capsule Take 1 capsule by mouth daily.    . Coenzyme Q10 (COQ-10) 100 MG CAPS Take 1 tablet by mouth daily.    . Cyanocobalamin (VITAMIN B-12 PO) Take 1 tablet by mouth daily.    Marland Kitchen ezetimibe (ZETIA) 10 MG tablet TAKE ONE TABLET BY MOUTH DAILY 90 tablet 1  . ferrous sulfate 325 (65 FE) MG tablet Take 325 mg by mouth daily with breakfast.    . Garlic 8119 MG CAPS Take 1 tablet by mouth daily.    Marland Kitchen glucose blood (ACCU-CHEK AVIVA PLUS) test strip 1 each by Other route daily. Use to monitor glucose levels daily; E11.8 100 each 2  . hydrocortisone (ANUSOL-HC) 2.5 % rectal cream Place 1 application rectally 2 (two) times daily. (Patient not taking: Reported on 08/02/2020) 30 g 1  . metFORMIN (GLUCOPHAGE) 500 MG tablet TAKE 4 TABLETS BY MOUTH DAILY 360 tablet 2  . Multiple Vitamin (MULTIVITAMIN) tablet Take 1 tablet by mouth daily.      . nitroGLYCERIN (NITROSTAT) 0.4 MG SL tablet Place 1 tablet (0.4 mg total) under the tongue every 5 (five) minutes as needed for chest pain. 75 tablet 1  . Omega-3 Fatty Acids (FISH OIL PO) Take 1 capsule by mouth daily.    . pantoprazole (PROTONIX) 40 MG tablet TAKE ONE TABLET BY MOUTH TWICE A [place] 180 tablet  0  . ranolazine (RANEXA) 500 MG 12 hr tablet Take 1 tablet (500 mg total) by mouth 2 (two) times daily. 180 tablet 3  . repaglinide (PRANDIN) 0.5 MG tablet Take 1 tablet (0.5 mg total) by mouth 2 (two) times daily before a meal.  180 tablet 3  . rosuvastatin (CRESTOR) 40 MG tablet Take 1 tablet by mouth once daily 90 tablet 0  . sildenafil (VIAGRA) 100 MG tablet TAKE ONE TABLET BY MOUTH DAILY AS NEEDED FOR ERECTILE DYSFUNCTION 30 tablet 0  . VITAMIN E PO Take 1 capsule by mouth daily.     No current facility-administered medications for this visit.    OBJECTIVE: African-American man who appears well  Vitals:   10/03/20 1541  BP: 100/68  Pulse: 77  Resp: 18  Temp: 98.2 F (36.8 C)  SpO2: 98%     Body mass index is 30.21 kg/m.   Wt Readings from Last 3 Encounters:  10/03/20 190 lb (86.2 kg)  08/09/20 196 lb 14.4 oz (89.3 kg)  08/02/20 195 lb (88.5 kg)     ECOG FS:1 - Symptomatic but completely ambulatory  Sclerae unicteric, EOMs intact Wearing a mask No cervical or supraclavicular adenopathy Lungs no rales or rhonchi Heart regular rate and rhythm Abd soft, nontender, positive bowel sounds MSK no focal spinal tenderness, no upper extremity lymphedema Neuro: nonfocal, well oriented, appropriate affect   LAB RESULTS:  CMP     Component Value Date/Time   NA 136 09/26/2020 1511   NA 141 10/26/2019 0903   K 4.1 09/26/2020 1511   CL 104 09/26/2020 1511   CO2 27 09/26/2020 1511   GLUCOSE 164 (H) 09/26/2020 1511   BUN 15 09/26/2020 1511   BUN 14 10/26/2019 0903   CREATININE 1.41 (H) 09/26/2020 1511   CREATININE 1.19 11/14/2015 1135   CALCIUM 10.0 09/26/2020 1511   PROT 8.2 (H) 09/26/2020 1511   PROT 7.6 10/26/2019 0903   ALBUMIN 3.9 09/26/2020 1511   ALBUMIN 4.4 10/26/2019 0903   AST 24 09/26/2020 1511   ALT 30 09/26/2020 1511   ALKPHOS 34 (L) 09/26/2020 1511   BILITOT 0.5 09/26/2020 1511   GFRNONAA 55 (L) 09/26/2020 1511   GFRAA >60 06/14/2020 1419    Lab Results  Component Value Date   TOTALPROTELP 7.5 02/22/2020   ALBUMINELP 3.9 02/22/2020   A1GS 0.2 02/22/2020   A2GS 0.8 02/22/2020   BETS 1.0 02/22/2020   GAMS 1.7 02/22/2020   MSPIKE 1.2 (H) 02/22/2020     Lab Results   Component Value Date   KPAFRELGTCHN 21.2 (H) 09/26/2020   LAMBDASER 22.7 09/26/2020   KAPLAMBRATIO 3.93 10/02/2020    Lab Results  Component Value Date   WBC 4.6 09/26/2020   NEUTROABS 2.7 09/26/2020   HGB 13.6 09/26/2020   HCT 39.2 09/26/2020   MCV 87.5 09/26/2020   PLT 149 (L) 09/26/2020      Chemistry      Component Value Date/Time   NA 136 09/26/2020 1511   NA 141 10/26/2019 0903   K 4.1 09/26/2020 1511   CL 104 09/26/2020 1511   CO2 27 09/26/2020 1511   BUN 15 09/26/2020 1511   BUN 14 10/26/2019 0903   CREATININE 1.41 (H) 09/26/2020 1511   CREATININE 1.19 11/14/2015 1135      Component Value Date/Time   CALCIUM 10.0 09/26/2020 1511   ALKPHOS 34 (L) 09/26/2020 1511   AST 24 09/26/2020 1511   ALT 30 09/26/2020 1511   BILITOT 0.5 09/26/2020  1511       No results found for: LABCA2  No components found for: EPPIRJ188  No results for input(s): INR in the last 168 hours.  No results found for: LABCA2  No results found for: CZY606  No results found for: TKZ601  No results found for: UXN235  No results found for: CA2729  No components found for: HGQUANT  No results found for: CEA1 / No results found for: CEA1   No results found for: AFPTUMOR  No results found for: CHROMOGRNA  No results found for: PSA1  No visits with results within 3 [place](s) from this visit.  Latest known visit with results is:  Appointment on 09/26/2020  Component Date Value Ref Range Status  . Beta-2 Microglobulin 09/26/2020 1.8  0.6 - 2.4 mg/L Final   Comment: (NOTE) Siemens Immulite 2000 Immunochemiluminometric assay (ICMA) Values obtained with different assay methods or kits cannot be used interchangeably. Results cannot be interpreted as absolute evidence of the presence or absence of malignant disease. Performed At: [facility] [place], [place] [id number] Rush Farmer MD [id number]   . WBC Count 09/26/2020 4.6  4.0 - 10.5 K/uL Final  .  RBC 09/26/2020 4.48  4.22 - 5.81 MIL/uL Final  . Hemoglobin 09/26/2020 13.6  13.0 - 17.0 g/dL Final  . HCT 09/26/2020 39.2  39.0 - 52.0 % Final  . MCV 09/26/2020 87.5  80.0 - 100.0 fL Final  . MCH 09/26/2020 30.4  26.0 - 34.0 pg Final  . MCHC 09/26/2020 34.7  30.0 - 36.0 g/dL Final  . RDW 09/26/2020 11.5  11.5 - 15.5 % Final  . Platelet Count 09/26/2020 149* 150 - 400 K/uL Final   PLATELET COUNT CONFIRMED BY SMEAR  . nRBC 09/26/2020 0.0  0.0 - 0.2 % Final  . Neutrophils Relative % 09/26/2020 58  % Final  . Neutro Abs 09/26/2020 2.7  1.7 - 7.7 K/uL Final  . Lymphocytes Relative 09/26/2020 32  % Final  . Lymphs Abs 09/26/2020 1.5  0.7 - 4.0 K/uL Final  . Monocytes Relative 09/26/2020 9  % Final  . Monocytes Absolute 09/26/2020 0.4  0.1 - 1.0 K/uL Final  . Eosinophils Relative 09/26/2020 1  % Final  . Eosinophils Absolute 09/26/2020 0.0  0.0 - 0.5 K/uL Final  . Basophils Relative 09/26/2020 0  % Final  . Basophils Absolute 09/26/2020 0.0  0.0 - 0.1 K/uL Final  . Immature Granulocytes 09/26/2020 0  % Final  . Abs Immature Granulocytes 09/26/2020 0.00  0.00 - 0.07 K/uL Final   Performed at [facility], [place] [address] [place], [place] [address]  . Sodium 09/26/2020 136  135 - 145 mmol/L Final  . Potassium 09/26/2020 4.1  3.5 - 5.1 mmol/L Final  . Chloride 09/26/2020 104  98 - 111 mmol/L Final  . CO2 09/26/2020 27  22 - 32 mmol/L Final  . Glucose, Bld 09/26/2020 164* 70 - 99 mg/dL Final   Glucose reference range applies only to samples taken after fasting for at least 8 hours.  . BUN 09/26/2020 15  8 - 23 mg/dL Final  . Creatinine 09/26/2020 1.41* 0.61 - 1.24 mg/dL Final  . Calcium 09/26/2020 10.0  8.9 - 10.3 mg/dL Final  . Total Protein 09/26/2020 8.2* 6.5 - 8.1 g/dL Final  . Albumin 09/26/2020 3.9  3.5 - 5.0 g/dL Final  . AST 09/26/2020 24  15 - 41 U/L Final  . ALT 09/26/2020 30  0 - 44 U/L  Final  . Alkaline Phosphatase 09/26/2020 34* 38 - 126 U/L Final  .  Total Bilirubin 09/26/2020 0.5  0.3 - 1.2 mg/dL Final  . GFR, Estimated 09/26/2020 55* >60 mL/min Final   Comment: (NOTE) Calculated using the CKD-EPI Creatinine Equation (2021)   . Anion gap 09/26/2020 5  5 - 15 Final   Performed at [facility], [place] [address], [place], [place] [address]  . CRP 09/26/2020 1.8* <1.0 mg/dL Final   Performed at [place] [address], [place], [place] [address]  . Kappa free light chain 09/26/2020 21.2* 3.3 - 19.4 mg/L Final  . Lamda free light chains 09/26/2020 22.7  5.7 - 26.3 mg/L Final  . Kappa, lamda light chain ratio 09/26/2020 0.93  0.26 - 1.65 Final   Comment: (NOTE) Performed At: [facility] [place], [place] [id number] Rush Farmer MD [id number]   . Platelet CT in Citrate 09/26/2020 EDTA platelet count consistent with citrate.   Final   Performed at [facility], [place] [address], [place], [place] [address]  . Retic Ct Pct 09/26/2020 1.4  0.4 - 3.1 % Final  . RBC. 09/26/2020 4.43  4.22 - 5.81 MIL/uL Final  . Retic Count, Absolute 09/26/2020 59.8  19.0 - 186.0 K/uL Final  . Immature Retic Fract 09/26/2020 10.8  2.3 - 15.9 % Final  . Reticulocyte Hemoglobin 09/26/2020 36.7  >27.9 pg Final   Comment:        Given the high negative predictive value of a RET-He result > 32 pg iron deficiency is essentially excluded. If this patient is anemic other etiologies should be considered. Performed at [facility], [place] [address], [place], [place] [address]   . Smear Review 09/26/2020 SMEAR STAINED AND AVAILABLE FOR REVIEW   Final   Performed at [facility], [address], [place], [place] [address]  . Sed Rate 09/26/2020 19* 0 - 16 mm/hr Final   Performed at [facility], [place] [address] [place], [place] [address]  . Total Protein, Urine 10/02/2020 9.6  Not Estab. mg/dL Final  .  Total Protein, Urine-Ur/Sandra 10/02/2020 125  30 - 150 mg/24 hr Final  . Albumin, U 10/02/2020 21.7  % Final  . ALPHA 1 URINE 10/02/2020 5.3  % Final  . Alpha 2, Urine 10/02/2020 14.4  % Final  . % BETA, Urine 10/02/2020 31.2  % Final  . GAMMA GLOBULIN URINE 10/02/2020 27.4  % Final  . Free Kappa Lt Chains,Ur 10/02/2020 41.41  0.63 - 113.79 mg/L Final  . Free Lambda Lt Chains,Ur 10/02/2020 10.54  0.47 - 11.77 mg/L Corrected  . Free Kappa/Lambda Ratio 10/02/2020 3.93  1.03 - 31.76 Corrected   Comment: (NOTE) Performed At: [facility] [place], [place] [id number] Rush Farmer MD [id number]   . Immunofixation Result, Urine 10/02/2020 Comment*  Corrected   Comment: (NOTE) Immunofixation shows IgG monoclonal protein with lambda light chain specificity. Bence Jones Protein positive; lambda type.   . Total Volume 10/02/2020 1,300   Final   Performed at [facility], [place] [address], [place], [place] [address]  . M-SPIKE %, Urine 10/02/2020 Not Observed  Not Observed % Corrected  . Note: 10/02/2020 Comment   Corrected   Comment: (NOTE) Protein electrophoresis scan will follow via computer, mail, or courier delivery.     (this displays the last labs from the last 3 days)  Lab Results  Component Value Date   TOTALPROTELP 7.5 02/22/2020   ALBUMINELP 3.9 02/22/2020   A1GS 0.2 02/22/2020   A2GS 0.8 02/22/2020   BETS 1.0 02/22/2020   GAMS 1.7 02/22/2020   MSPIKE 1.2 (H) 02/22/2020   (this displays SPEP labs)  Lab Results  Component Value Date   KPAFRELGTCHN 21.2 (H) 09/26/2020   LAMBDASER 22.7 09/26/2020   KAPLAMBRATIO 3.93 10/02/2020   (kappa/lambda light chains)  No results found for: HGBA, HGBA2QUANT, HGBFQUANT, HGBSQUAN (Hemoglobinopathy evaluation)   Lab Results  Component Value Date   LDH 203 (H) 06/14/2020    Lab Results  Component Value Date   IRON 75 08/06/2020   TIBC 389 08/06/2020   IRONPCTSAT 19 (L)  08/06/2020   (Iron and TIBC)  Lab Results  Component Value Date   FERRITIN 35 08/06/2020    Urinalysis    Component Value Date/Time   COLORURINE YELLOW 05/19/2013 2205   APPEARANCEUR CLEAR 05/19/2013 2205   LABSPEC 1.034 (H) 05/19/2013 2205   PHURINE 6.5 05/19/2013 2205   GLUCOSEU >1000 (A) 05/19/2013 2205   HGBUR NEGATIVE 05/19/2013 2205   BILIRUBINUR neg 04/11/2019 1007   KETONESUR NEGATIVE 05/19/2013 2205   PROTEINUR Positive (A) 04/11/2019 1007   PROTEINUR NEGATIVE 05/19/2013 2205   UROBILINOGEN 0.2 04/11/2019 1007   UROBILINOGEN 1.0 05/19/2013 2205   NITRITE neg 04/11/2019 1007   NITRITE NEGATIVE 05/19/2013 2205   LEUKOCYTESUR Negative 04/11/2019 1007    STUDIES: No results found.   ASSESSMENT: 70 y.o. [place] man with  (1) iron deficiency anemia:  (a) colonoscopy 12/07/2009 showed no evidence of malignancy   (b) EGD 02/23/2020 showed gastritis and duodenitis with positive H. Pylori  (c) no improvement in ferritin or iron saturation despite compliant oral iron intake  (d) Feraheme given 03/01/2020 and 03/08/2020  (2) leukopenia: resolved by the time of initial visit 02/22/2020  (a) note lower limit of normal WBC for African Americans is 3.5  (b) intermittently lower in the past possibly secondary to transient viral infection  (3) Thrombocytopenia: mild, with counts well over 100 K on multiple occasions  (a) abdominal ultrasound on 02/21/2020 shows no spleen enlargement  (b) blood film shows no schistocytes and no artifactual platelet clumping   (c) platelet count in citrate same as in EDTA  (d) chronic, possibly secondary to medications, requiring only follow-up  (4) MGUS:  (a) SPEP 02/22/2020 shows an M spike of 1.2%, IFE confirms IgG lambda clonality  (b) kappa lambda ratio 09/26/2020 was normal at 0.93.  (c) 24 hr urine shows the total protein not to be elevated.  Free lambda light chains were 41 mg/L  (d) beta-2 microglobulin 09/26/2020 was normal at  1.8   PLAN: Gurley is doing generally well and is kappa lambda ratio is normal.  His M spike is pending. 24 urine results are favorable, with no significant Bence-Jones proteinuria.  He understands that we are going to be following the M spike in particular in the future and that that will guide the need for treatment if treatment is needed at any particular point  The mildly low platelet count was actually very close to normal this time.  This requires no further evaluation.  Of course we will be following complete blood count and future lab work  His iron level was normal in November.  He tolerates oral iron well and I suggested he continue on 1 tablet of iron daily indefinitely.  We will continue to check his ferritin with subsequent labs  We will check his lab work again in July and December and he will see [place] again January of next year.  Of course if we noted a jump or other trend in the M spike we would follow him more closely.  All this was discussed with him in detail.  He knows to call for any other issue that may develop before the next visit.  Total encounter time: 25 minutes*   Cereniti Curb C. Gerldine Suleiman, MD 10/15/20 4:43 PM Medical Oncology and Hematology [facility] [place], [place] [address] Tel. [phone]    Fax. [phone]   I, Wilburn Mylar, am acting as scribe for Dr. Virgie Dad. Agapita Savarino.  I, [name] MD, have reviewed the above documentation for accuracy and completeness, and I agree with the above.    *Total Encounter Time as defined by the Centers for Medicare and Medicaid Services includes, in addition to the face-to-face time of a patient visit (documented in the note above) non-face-to-face time: obtaining and reviewing outside history, ordering and reviewing medications, tests or procedures, care coordination (communications with other health care professionals or caregivers) and documentation in the medical record.

## 2020-10-04 ENCOUNTER — Other Ambulatory Visit: Payer: Self-pay | Admitting: Oncology

## 2020-10-04 LAB — UPEP/UIFE/LIGHT CHAINS/TP, 24-HR UR
% BETA, Urine: 31.2 %
ALPHA 1 URINE: 5.3 %
Albumin, U: 21.7 %
Alpha 2, Urine: 14.4 %
Free Kappa Lt Chains,Ur: 41.41 mg/L (ref 0.63–113.79)
Free Kappa/Lambda Ratio: 3.93 (ref 1.03–31.76)
Free Lambda Lt Chains,Ur: 10.54 mg/L (ref 0.47–11.77)
GAMMA GLOBULIN URINE: 27.4 %
Total Protein, Urine-Ur/day: 125 mg/24 hr (ref 30–150)
Total Protein, Urine: 9.6 mg/dL
Total Volume: 1300

## 2020-10-31 ENCOUNTER — Other Ambulatory Visit: Payer: Self-pay | Admitting: Gastroenterology

## 2020-11-04 ENCOUNTER — Other Ambulatory Visit: Payer: Self-pay | Admitting: Family Medicine

## 2020-11-09 ENCOUNTER — Other Ambulatory Visit: Payer: Self-pay

## 2020-11-09 ENCOUNTER — Ambulatory Visit (INDEPENDENT_AMBULATORY_CARE_PROVIDER_SITE_OTHER): Payer: Medicare HMO

## 2020-11-09 VITALS — BP 126/80 | HR 90 | Temp 97.6°F | Ht 67.0 in | Wt 190.4 lb

## 2020-11-09 DIAGNOSIS — Z0000 Encounter for general adult medical examination without abnormal findings: Secondary | ICD-10-CM

## 2020-11-09 NOTE — Progress Notes (Signed)
Subjective:   Jerome Vaughn is a 70 y.o. male who presents for an Initial Medicare Annual Wellness Visit.  Review of Systems    N/A  Cardiac Risk Factors include: advanced age (>82mn, >>86women);male gender;hypertension     Objective:    Today's Vitals   11/09/20 1031  BP: 126/80  Pulse: 90  Temp: 97.6 F (36.4 C)  TempSrc: Oral  SpO2: 96%  Weight: 190 lb 7 oz (86.4 kg)  Height: '5\' 7"'  (1.702 m)   Body mass index is 29.83 kg/m.  Advanced Directives 11/09/2020 12/13/2018 12/12/2018 10/27/2017 12/06/2015 12/05/2015 11/21/2015  Does Patient Have a Medical Advance Directive? No No No No No No No  Would patient like information on creating a medical advance directive? No - Patient declined No - Patient declined - No - Patient declined No - patient declined information - No - patient declined information    Current Medications (verified) Outpatient Encounter Medications as of 11/09/2020  Medication Sig  . Accu-Chek Softclix Lancets lancets 1 each by Other route daily. Use to monitor glucose levels daily; E11.8  . acetaminophen (TYLENOL) 500 MG tablet Take 500 mg by mouth as needed.  . APPLE CIDER VINEGAR PO Take by mouth daily.   . Ascorbic Acid (VITAMIN C PO) Take 1 tablet by mouth daily.  .Marland Kitchenaspirin EC 81 MG tablet Take 1 tablet (81 mg total) by mouth daily.  . benazepril (LOTENSIN) 20 MG tablet TAKE 1 TABLET EVERY [place]  . Blood Glucose Monitoring Suppl (ACCU-CHEK AVIVA PLUS) w/Device KIT 1 each by Does not apply route daily. Use to monitor glucose levels daily; E11.8  . carvedilol (COREG) 3.125 MG tablet TAKE 1 TABLET (3.125 MG TOTAL) BY MOUTH 2 (TWO) TIMES DAILY WITH A MEAL.  .Marland KitchenCinnamon 500 MG capsule Take 1 capsule by mouth daily.  . Coenzyme Q10 (COQ-10) 100 MG CAPS Take 1 tablet by mouth daily.  . Cyanocobalamin (VITAMIN B-12 PO) Take 1 tablet by mouth daily.  .Marland Kitchenezetimibe (ZETIA) 10 MG tablet TAKE ONE TABLET BY MOUTH DAILY  . ferrous sulfate 325 (65 FE) MG tablet Take 325 mg by  mouth daily with breakfast.  . Garlic 17341MG CAPS Take 1 tablet by mouth daily.  .Marland Kitchenglucose blood (ACCU-CHEK AVIVA PLUS) test strip 1 each by Other route daily. Use to monitor glucose levels daily; E11.8  . metFORMIN (GLUCOPHAGE) 500 MG tablet TAKE 4 TABLETS BY MOUTH DAILY  . Multiple Vitamin (MULTIVITAMIN) tablet Take 1 tablet by mouth daily.  . Omega-3 Fatty Acids (FISH OIL PO) Take 1 capsule by mouth daily.  . pantoprazole (PROTONIX) 40 MG tablet TAKE ONE TABLET BY MOUTH TWICE A [place]  . ranolazine (RANEXA) 500 MG 12 hr tablet Take 1 tablet (500 mg total) by mouth 2 (two) times daily.  . repaglinide (PRANDIN) 0.5 MG tablet Take 1 tablet (0.5 mg total) by mouth 2 (two) times daily before a meal.  . rosuvastatin (CRESTOR) 40 MG tablet Take 1 tablet by mouth once daily  . sildenafil (VIAGRA) 100 MG tablet TAKE ONE TABLET BY MOUTH DAILY AS NEEDED FOR ERECTILE DYSFUNCTION  . VITAMIN E PO Take 1 capsule by mouth daily.  . bromocriptine (PARLODEL) 2.5 MG tablet Take 1 tablet by mouth once daily  . nitroGLYCERIN (NITROSTAT) 0.4 MG SL tablet Place 1 tablet (0.4 mg total) under the tongue every 5 (five) minutes as needed for chest pain. (Patient not taking: Reported on 11/09/2020)  . [DISCONTINUED] bacitracin-neomycin-polymyxin-hydrocortisone (CORTISPORIN) 1 % ophthalmic ointment  Place 1 application into the left eye 2 (two) times daily.  . [DISCONTINUED] hydrocortisone (ANUSOL-HC) 2.5 % rectal cream Place 1 application rectally 2 (two) times daily. (Patient not taking: Reported on 08/02/2020)   No facility-administered encounter medications on file as of 11/09/2020.    Allergies (verified) Patient has no known allergies.   History: Past Medical History:  Diagnosis Date  . Carpal tunnel syndrome    both hands  . Colon polyps   . Coronary artery disease    a. Cath 11/21/2015: Multivessel CAD --> CABG recommended. b. CABG on 11/22/2015:  LIMA-LAD, SVG-Diag, SVG-OM1 and distal Cx, and SVG-PDA.  .  Diabetes mellitus   . GERD (gastroesophageal reflux disease)   . History of echocardiogram    a. Echo 4/17: Moderate LVH, EF 50-55%, mild LAE, no pericardial effusion  . Hyperlipidemia   . Hypertension   . Pneumonia    Past Surgical History:  Procedure Laterality Date  . CARDIAC CATHETERIZATION N/A 11/21/2015   Procedure: Left Heart Cath and Coronary Angiography;  Surgeon: [name], MD;  Location: [facility];  Service: Cardiovascular;  Laterality: N/A;  . COLONOSCOPY  02/23/2020   per Dr. Tarri Glenn, tubular adenomas, repeat in 3 yrs   . colonsocopy  12/07/2009   per Dr. [name], benign polyps, repeat in 10 yrs   . CORONARY ARTERY BYPASS GRAFT N/A 11/22/2015   Procedure: CORONARY ARTERY BYPASS GRAFTING (CABG) times five using the left internal mammary, right greater saphenous vein EVH, and left thigh greater saphenous vein EVH;  Surgeon: [name], MD;  Location: [facility];  Service: Open Heart Surgery;  Laterality: N/A;  . frozen shoulder release Right   . INTRAVASCULAR PRESSURE WIRE/FFR STUDY N/A 12/14/2018   Procedure: INTRAVASCULAR PRESSURE WIRE/FFR STUDY;  Surgeon: Jettie Booze, MD;  Location: [facility];  Service: Cardiovascular;  Laterality: N/A;  . LEFT HEART CATH AND CORS/GRAFTS ANGIOGRAPHY N/A 10/27/2017   Procedure: LEFT HEART CATH AND CORS/GRAFTS ANGIOGRAPHY;  Surgeon: [name], MD;  Location: [facility];  Service: Cardiovascular;  Laterality: N/A;  . LEFT HEART CATH AND CORS/GRAFTS ANGIOGRAPHY N/A 12/14/2018   Procedure: LEFT HEART CATH AND CORS/GRAFTS ANGIOGRAPHY;  Surgeon: Jettie Booze, MD;  Location: [facility];  Service: Cardiovascular;  Laterality: N/A;  . TEE WITHOUT CARDIOVERSION N/A 11/22/2015   Procedure: TRANSESOPHAGEAL ECHOCARDIOGRAM (TEE);  Surgeon: [name], MD;  Location: [facility];  Service: Open Heart Surgery;  Laterality: N/A;  . WRIST GANGLION EXCISION Right    Family History  Problem Relation Age of  Onset  . Heart failure Father   . Anuerysm Brother        AAA  . Hyperlipidemia Sister   . Diabetes Sister   . Hypertension Sister   . Kidney failure Sister   . Diabetes Maternal Grandmother   . Hyperlipidemia Sister   . Diabetes Sister   . Colon polyps Sister   . Kidney disease Sister   . Other Brother        COVID 19  . Diabetes Son   . Heart disease Son   . Heart attack Neg Hx   . Colon cancer Neg Hx   . Stomach cancer Neg Hx   . Esophageal cancer Neg Hx    Social History   Socioeconomic History  . Marital status: Married    Spouse name: Not on file  . Number of children: 5  . Years of education: Not on file  .  Highest education level: Not on file  Occupational History  . Occupation: Counsellor  Tobacco Use  . Smoking status: Former Smoker    Packs/Dunn: 0.25    Years: 20.00    Pack years: 5.00    Types: Cigarettes    Quit date: 10/07/2013    Years since quitting: 7.0  . Smokeless tobacco: Never Used  . Tobacco comment: quit cigarettes, might have a cigar 1 X month  Vaping Use  . Vaping Use: Never used  Substance and Sexual Activity  . Alcohol use: Yes    Alcohol/week: 3.0 - 4.0 standard drinks    Types: 3 - 4 Standard drinks or equivalent per week  . Drug use: No  . Sexual activity: Not Currently    Birth control/protection: None  Other Topics Concern  . 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 Difficulty of Paying Living Expenses: Not hard at all  Food Insecurity: No Food Insecurity  . Worried About Charity fundraiser in the Last Year: Never true  . Ran Out of Food in the Last Year: Never true  Transportation Needs: No Transportation Needs  . Lack of Transportation (Medical): No  . Lack of Transportation (Non-Medical): No  Physical  Activity: Inactive  . Days of Exercise per Week: 0 days  . Minutes of Exercise per Session: 0 min  Stress: No Stress Concern Present  . Feeling of Stress : Not at all  Social Connections: Socially Integrated  . Frequency of Communication with Friends and Family: More than three times a week  . Frequency of Social Gatherings with Friends and Family: More than three times a week  . Attends Religious Services: More than 4 times per year  . Active Member of Clubs or Organizations: Yes  . Attends Archivist Meetings: More than 4 times per year  . Marital Status: Married    Tobacco Counseling Counseling given: Not Answered Comment: quit cigarettes, might have a cigar 1 X month   Clinical Intake:  Pre-visit preparation completed: Yes  Pain : No/denies pain     Nutritional Risks: None Diabetes: Yes CBG done?: No Did pt. bring in CBG monitor from home?: No  How often do you need to have someone help you when you read instructions, pamphlets, or other written materials from your doctor or pharmacy?: 1 - Never What is the last grade level you completed in school?: 12th Grade  Diabetic?Yes Nutrition Risk Assessment:  Has the patient had any N/V/D within the last 2 months?  No  Does the patient have any non-healing wounds?  No  Has the patient had any unintentional weight loss or weight gain?  No   Diabetes:  Is the patient diabetic?  Yes  If diabetic, was a CBG obtained today?  No  Did the patient bring in their glucometer from home?  No  How often do you monitor your CBG's? State checks glucose twice a [place].   Financial Strains and Diabetes Management:  Are you having any financial strains with the device, your supplies or your medication? No .  Does the patient want to be seen by Chronic Care Management for management of their diabetes?  No Would the patient like to be referred to a  Nutritionist or for Diabetic Management?  No   Diabetic Exams:  Diabetic Eye  Exam: Completed 09/18/2020 Diabetic Foot Exam: Completed 06/12/2020   Interpreter Needed?: No  Information entered by :: [place] of Daily Living In your present state of health, do you have any difficulty performing the following activities: 11/09/2020  Hearing? N  Vision? N  Difficulty concentrating or making decisions? N  Walking or climbing stairs? N  Dressing or bathing? N  Doing errands, shopping? N  Preparing Food and eating ? N  Using the Toilet? N  In the past six months, have you accidently leaked urine? N  Do you have problems with loss of bowel control? N  Managing your Medications? N  Managing your Finances? N  Housekeeping or managing your Housekeeping? N  Some recent data might be hidden    Patient Care Team: [name], MD as PCP - General (Family Medicine) [name], MD as PCP - Cardiology (Cardiology) Earnie Larsson, [facility] as Pharmacist (Pharmacist) Thornton Park, MD as Consulting Physician (Gastroenterology) Magrinat, Virgie Dad, MD as Consulting Physician (Oncology) [name], MD as Consulting Physician (Endocrinology)  Indicate any recent Medical Services you may have received from other than [facility] providers in the past year (date may be approximate).     Assessment:   This is a routine wellness examination for [facility].  Hearing/Vision screen  Hearing Screening   '125Hz'  '250Hz'  '500Hz'  '1000Hz'  '2000Hz'  '3000Hz'  '4000Hz'  '6000Hz'  '8000Hz'   Right ear:           Left ear:           Vision Screening Comments: Gets eye exams once per year   Dietary issues and exercise activities discussed: Current Exercise Habits: The patient has a physically strenuous job, but has no regular exercise apart from work., Exercise limited by: None identified  Goals    . Pharmacy Care Plan     CARE PLAN ENTRY (see longitudinal plan of care for additional care plan information)  Current Barriers:  . Chronic Disease Management support, education, and  care coordination needs related to Hypertension, Hyperlipidemia, Diabetes, Heart Failure, Coronary Artery Disease, and indigestion   Hypertension BP Readings from Last 3 Encounters:  03/08/20 111/67  03/01/20 138/86  02/23/20 105/66   . Pharmacist Clinical Goal(s): o Over the next 90 days, patient will work with PharmD and providers to maintain BP goal <130/80 . Current regimen:   Benazepril 6m ,1 tablet once daily  Carvedilol 3.1226m 1 tablet twice daily with a meal  . Patient self care activities - Over the next 90 days, patient will: o Check BP at least 1 to 2 times per week, document, and provide at future appointments o Ensure daily salt intake < 2300 mg/[place] o Continue follow up visits with cardiology (Dr. SmTamala Julian   Hyperlipidemia Lab Results  Component Value Date/Time   LDLCALC 43 10/26/2019 09:03 AM   LDLDIRECT 204.8 09/05/2013 03:59 PM   . Pharmacist Clinical Goal(s): o Over the next 90 days, patient will work with PharmD and providers to maintain LDL goal < 70 . Current regimen:  . Rosuvastatin 4050m1 tablet once daily . ezetimibie 75m57m tablet once daily  . Patient self care activities - Over the next 90 days, patient will: o Continue current medications as directed.   Coronary artery disease (history of cardiac artery bypass graft x5) . Pharmacist Clinical Goal(s) o Over the next 90 days, patient will work with PharmD and providers to maintain on secondary  prevention of cardiac events.  . Current regimen:  . Aspirin 24m, 1 tablet once daily   . Nitroglycerin 0.451mSL, place 1 tablet under tongue every five minutes as needed for chest pain . Ranolazine 50041m1 tablet twice daily  . Rosuvastatin 76m3m tablet once daily . Interventions: o Cautioned drug interaction between nitroglycerin and sildenafil.  . Patient self care activities o Patient will continue current medications and follow up with cardiology (Dr. SmitTamala Julian Diabetes Lab Results   Component Value Date/Time   HGBA1C 7.0 (A) 02/08/2020 08:35 AM   HGBA1C 6.5 (A) 10/06/2019 08:37 AM   HGBA1C 7.6 (H) 11/21/2015 06:20 PM   HGBA1C 7.1 (H) 02/19/2015 02:35 PM   . Pharmacist Clinical Goal(s): o Over the next 90 days, patient will work with PharmD and providers to achieve A1c goal <7% . Current regimen:   Metformin 500mg35mtablets once daily  repaglinide 0.5mg, 39mablet twice daily before a meal   . Bromocriptine 2.5mg, 153mblet once daily   . Interventions: o Recommend separating dose of metformin to 2 tablets twice daily with food (breakfast and evening meal).  . Patient self care activities - Over the next 90 days, patient will: o Check blood sugar twice daily, document, and provide at future appointments o Contact provider with any episodes of hypoglycemia  Indigestion . Pharmacist Clinical Goal(s) o Over the next 90 days, patient will work with PharmD and providers to minimize symptoms.  . Current regimen:  o Pantoprazole 76mg, 160mlet twice daily  . Patient self care activities o Patient will continue current medication and follow up visits with gastroenterology (Dr. Beavers)Tarri Glenndication management . Pharmacist Clinical Goal(s): o Over the next 90 days, patient will work with PharmD and providers to maintain optimal medication adherence . [facility]  . Interventions o Comprehensive medication review performed. o Continue current medication management strategy . Patient self care activities - Over the next 90 days, patient will: o Take medications as prescribed o Report any questions or concerns to PharmD and/or provider(s)  Initial goal documentation     . Weight (lb) < 180 lb (81.6 kg)      Depression Screen PHQ 2/9 Scores 11/09/2020 08/02/2020  PHQ - 2 Score 0 0  PHQ- 9 Score - 0    Fall Risk Fall Risk  11/09/2020 08/02/2020  Falls in the past year? 0 0  Number falls in past yr: 0 0  Injury with Fall? 0 0  Risk  for fall due to : No Fall Risks -  Follow up Falls evaluation completed;Falls prevention discussed -    FALL RISK PREVENTION PERTAINING TO THE HOME:  Any stairs in or around the home? No  If so, are there any without handrails? No  Home free of loose throw rugs in walkways, pet beds, electrical cords, etc? Yes  Adequate lighting in your home to reduce risk of falls? Yes   ASSISTIVE DEVICES UTILIZED TO PREVENT FALLS:  Life alert? No  Use of a cane, walker or w/c? No  Grab bars in the bathroom? No  Shower chair or bench in shower? No  Elevated toilet seat or a handicapped toilet? No   TIMED UP AND GO:  Was the test performed? Yes .  Length of time to ambulate 10 feet: 3 sec.   Gait steady and fast without use of assistive device  Cognitive Function:     Normal cognitive status assessed by direct  observation by this Nurse Health Advisor. No abnormalities found.      Immunizations Immunization History  Administered Date(s) Administered  . Fluad Quad(high Dose 65+) 08/02/2020  . Influenza, High Dose Seasonal PF 05/30/2019  . Influenza,inj,Quad PF,6+ Mos 08/22/2014, 12/03/2016, 11/23/2017  . Influenza-Unspecified 08/29/2013, 07/30/2015, 08/02/2016, 10/04/2018, 10/07/2018, 05/12/2019  . PFIZER(Purple Top)SARS-COV-2 Vaccination 11/11/2019, 12/05/2019  . Pneumococcal Conjugate-13 08/27/2015  . Pneumococcal Polysaccharide-23 02/09/2017  . Tdap 03/28/2011  . Zoster Recombinat (Shingrix) 11/26/2018, 05/31/2019    TDAP status: Up to date  Flu Vaccine status: Up to date  Pneumococcal vaccine status: Completed during today's visit.  Covid-19 vaccine status: Completed vaccines  Qualifies for Shingles Vaccine? Yes   Zostavax completed No   Shingrix Completed?: Yes  Screening Tests Health Maintenance  Topic Date Due  . COVID-19 Vaccine (3 - Pfizer risk 4-dose series) 01/02/2020  . HEMOGLOBIN A1C  12/10/2020  . TETANUS/TDAP  03/27/2021  . FOOT EXAM  06/12/2021  .  OPHTHALMOLOGY EXAM  09/18/2021  . PNA vac Low Risk Adult (2 of 2 - PPSV23) 02/09/2022  . COLONOSCOPY (Pts 45-2yr Insurance coverage will need to be confirmed)  02/23/2023  . INFLUENZA VACCINE  Completed  . Hepatitis C Screening  Completed    Health Maintenance  Health Maintenance Due  Topic Date Due  . COVID-19 Vaccine (3 - Pfizer risk 4-dose series) 01/02/2020    Colorectal cancer screening: Type of screening: Colonoscopy. Completed 02/23/2020. Repeat every 3 years  Lung Cancer Screening: (Low Dose CT Chest recommended if Age 68-80years, 30 pack-year currently smoking OR have quit w/in 15years.) does not qualify.   Lung Cancer Screening Referral: N/A   Additional Screening:  Hepatitis C Screening: does qualify; Completed 02/22/2020  Vision Screening: Recommended annual ophthalmology exams for early detection of glaucoma and other disorders of the eye. Is the patient up to date with their annual eye exam?  Yes  Who is the provider or what is the name of the office in which the patient attends annual eye exams? [facility] If pt is not established with a provider, would they like to be referred to a provider to establish care? No .   Dental Screening: Recommended annual dental exams for proper oral hygiene  Community Resource Referral / Chronic Care Management: CRR required this visit?  No   CCM required this visit?  No      Plan:     I have personally reviewed and noted the following in the patient's chart:   . Medical and social history . Use of alcohol, tobacco or illicit drugs  . Current medications and supplements . Functional ability and status . Nutritional status . Physical activity . Advanced directives . List of other physicians . Hospitalizations, surgeries, and ER visits in previous 12 months . Vitals . Screenings to include cognitive, depression, and falls . Referrals and appointments  In addition, I have reviewed and discussed with patient  certain preventive protocols, quality metrics, and best practice recommendations. A written personalized care plan for preventive services as well as general preventive health recommendations were provided to patient.     SOfilia Neas LPN   24/05/8118  Nurse Notes: None

## 2020-11-09 NOTE — Patient Instructions (Signed)
Jerome Vaughn , Thank you for taking time to come for your Medicare Wellness Visit. I appreciate your ongoing commitment to your health goals. Please review the following plan we discussed and let me know if I can assist you in the future.   Screening recommendations/referrals: Colonoscopy: Up to date, next due 02/22/2022 Recommended yearly ophthalmology/optometry visit for glaucoma screening and checkup Recommended yearly dental visit for hygiene and checkup  Vaccinations: Influenza vaccine: Up to date, next due fall 2022 Pneumococcal vaccine: Completed series  Tdap vaccine: Up to date, next due 03/27/2021 Shingles vaccine: Completed series     Advanced directives: Advance directive discussed with you today. Even though you declined this today please call our office should you change your mind and we can give you the proper paperwork for you to fill out.   Conditions/risks identified: None   Next appointment: None   Preventive Care 65 Years and Older, Male Preventive care refers to lifestyle choices and visits with your health care provider that can promote health and wellness. What does preventive care include?  A yearly physical exam. This is also called an annual well check.  Dental exams once or twice a year.  Routine eye exams. Ask your health care provider how often you should have your eyes checked.  Personal lifestyle choices, including:  Daily care of your teeth and gums.  Regular physical activity.  Eating a healthy diet.  Avoiding tobacco and drug use.  Limiting alcohol use.  Practicing safe sex.  Taking low doses of aspirin every [place].  Taking vitamin and mineral supplements as recommended by your health care provider. What happens during an annual well check? The services and screenings done by your health care provider during your annual well check will depend on your age, overall health, lifestyle risk factors, and family history of disease. Counseling   Your health care provider may ask you questions about your:  Alcohol use.  Tobacco use.  Drug use.  Emotional well-being.  Home and relationship well-being.  Sexual activity.  Eating habits.  History of falls.  Memory and ability to understand (cognition).  Work and work Statistician. Screening  You may have the following tests or measurements:  Height, weight, and BMI.  Blood pressure.  Lipid and cholesterol levels. These may be checked every 5 years, or more frequently if you are over 36 years old.  Skin check.  Lung cancer screening. You may have this screening every year starting at age 31 if you have a 30-pack-year history of smoking and currently smoke or have quit within the past 15 years.  Fecal occult blood test (FOBT) of the stool. You may have this test every year starting at age 4.  Flexible sigmoidoscopy or colonoscopy. You may have a sigmoidoscopy every 5 years or a colonoscopy every 10 years starting at age 1.  Prostate cancer screening. Recommendations will vary depending on your family history and other risks.  Hepatitis C blood test.  Hepatitis B blood test.  Sexually transmitted disease (STD) testing.  Diabetes screening. This is done by checking your blood sugar (glucose) after you have not eaten for a while (fasting). You may have this done every 1-3 years.  Abdominal aortic aneurysm (AAA) screening. You may need this if you are a current or former smoker.  Osteoporosis. You may be screened starting at age 18 if you are at high risk. Talk with your health care provider about your test results, treatment options, and if necessary, the need for more tests.  Vaccines  Your health care provider may recommend certain vaccines, such as:  Influenza vaccine. This is recommended every year.  Tetanus, diphtheria, and acellular pertussis (Tdap, Td) vaccine. You may need a Td booster every 10 years.  Zoster vaccine. You may need this after age  35.  Pneumococcal 13-valent conjugate (PCV13) vaccine. One dose is recommended after age 45.  Pneumococcal polysaccharide (PPSV23) vaccine. One dose is recommended after age 34. Talk to your health care provider about which screenings and vaccines you need and how often you need them. This information is not intended to replace advice given to you by your health care provider. Make sure you discuss any questions you have with your health care provider. Document Released: 10/12/2015 Document Revised: 06/04/2016 Document Reviewed: 07/17/2015 Elsevier Interactive Patient Education  2017 [employer] Prevention in the Home Falls can cause injuries. They can happen to people of all ages. There are many things you can do to make your home safe and to help prevent falls. What can I do on the outside of my home?  Regularly fix the edges of walkways and driveways and fix any cracks.  Remove anything that might make you trip as you walk through a door, such as a raised step or threshold.  Trim any bushes or trees on the path to your home.  Use bright outdoor lighting.  Clear any walking paths of anything that might make someone trip, such as rocks or tools.  Regularly check to see if handrails are loose or broken. Make sure that both sides of any steps have handrails.  Any raised decks and porches should have guardrails on the edges.  Have any leaves, snow, or ice cleared regularly.  Use sand or salt on walking paths during winter.  Clean up any spills in your garage right away. This includes oil or grease spills. What can I do in the bathroom?  Use night lights.  Install grab bars by the toilet and in the tub and shower. Do not use towel bars as grab bars.  Use non-skid mats or decals in the tub or shower.  If you need to sit down in the shower, use a plastic, non-slip stool.  Keep the floor dry. Clean up any water that spills on the floor as soon as it happens.  Remove  soap buildup in the tub or shower regularly.  Attach bath mats securely with double-sided non-slip rug tape.  Do not have throw rugs and other things on the floor that can make you trip. What can I do in the bedroom?  Use night lights.  Make sure that you have a light by your bed that is easy to reach.  Do not use any sheets or blankets that are too big for your bed. They should not hang down onto the floor.  Have a firm chair that has side arms. You can use this for support while you get dressed.  Do not have throw rugs and other things on the floor that can make you trip. What can I do in the kitchen?  Clean up any spills right away.  Avoid walking on wet floors.  Keep items that you use a lot in easy-to-reach places.  If you need to reach something above you, use a strong step stool that has a grab bar.  Keep electrical cords out of the way.  Do not use floor polish or wax that makes floors slippery. If you must use wax, use non-skid floor wax.  Do not have throw rugs and other things on the floor that can make you trip. What can I do with my stairs?  Do not leave any items on the stairs.  Make sure that there are handrails on both sides of the stairs and use them. Fix handrails that are broken or loose. Make sure that handrails are as long as the stairways.  Check any carpeting to make sure that it is firmly attached to the stairs. Fix any carpet that is loose or worn.  Avoid having throw rugs at the top or bottom of the stairs. If you do have throw rugs, attach them to the floor with carpet tape.  Make sure that you have a light switch at the top of the stairs and the bottom of the stairs. If you do not have them, ask someone to add them for you. What else can I do to help prevent falls?  Wear shoes that:  Do not have high heels.  Have rubber bottoms.  Are comfortable and fit you well.  Are closed at the toe. Do not wear sandals.  If you use a  stepladder:  Make sure that it is fully opened. Do not climb a closed stepladder.  Make sure that both sides of the stepladder are locked into place.  Ask someone to hold it for you, if possible.  Clearly mark and make sure that you can see:  Any grab bars or handrails.  First and last steps.  Where the edge of each step is.  Use tools that help you move around (mobility aids) if they are needed. These include:  Canes.  Walkers.  Scooters.  Crutches.  Turn on the lights when you go into a dark area. Replace any light bulbs as soon as they burn out.  Set up your furniture so you have a clear path. Avoid moving your furniture around.  If any of your floors are uneven, fix them.  If there are any pets around you, be aware of where they are.  Review your medicines with your doctor. Some medicines can make you feel dizzy. This can increase your chance of falling. Ask your doctor what other things that you can do to help prevent falls. This information is not intended to replace advice given to you by your health care provider. Make sure you discuss any questions you have with your health care provider. Document Released: 07/12/2009 Document Revised: 02/21/2016 Document Reviewed: 10/20/2014 Elsevier Interactive Patient Education  2017 [employer].

## 2020-11-15 ENCOUNTER — Ambulatory Visit: Payer: Medicare HMO

## 2020-11-30 ENCOUNTER — Ambulatory Visit (INDEPENDENT_AMBULATORY_CARE_PROVIDER_SITE_OTHER): Payer: Medicare HMO

## 2020-11-30 ENCOUNTER — Ambulatory Visit (HOSPITAL_COMMUNITY)
Admission: EM | Admit: 2020-11-30 | Discharge: 2020-11-30 | Disposition: A | Discharge status: Home / Self Care | Payer: Medicare HMO | Attending: Family Medicine | Admitting: Family Medicine

## 2020-11-30 ENCOUNTER — Other Ambulatory Visit: Payer: Self-pay

## 2020-11-30 ENCOUNTER — Ambulatory Visit (HOSPITAL_COMMUNITY): Payer: Self-pay

## 2020-11-30 ENCOUNTER — Encounter (HOSPITAL_COMMUNITY): Payer: Self-pay

## 2020-11-30 ENCOUNTER — Other Ambulatory Visit: Payer: Self-pay | Admitting: Endocrinology

## 2020-11-30 DIAGNOSIS — R062 Wheezing: Secondary | ICD-10-CM

## 2020-11-30 DIAGNOSIS — R059 Cough, unspecified: Secondary | ICD-10-CM

## 2020-11-30 DIAGNOSIS — R0602 Shortness of breath: Secondary | ICD-10-CM

## 2020-11-30 LAB — POCT URINALYSIS DIPSTICK, ED / UC
Bilirubin Urine: NEGATIVE
Glucose, UA: NEGATIVE mg/dL
Hgb urine dipstick: NEGATIVE
Ketones, ur: NEGATIVE mg/dL
Leukocytes,Ua: NEGATIVE
Nitrite: NEGATIVE
Protein, ur: NEGATIVE mg/dL
Specific Gravity, Urine: 1.025 (ref 1.005–1.030)
Urobilinogen, UA: 0.2 mg/dL (ref 0.0–1.0)
pH: 6 (ref 5.0–8.0)

## 2020-11-30 LAB — CBG MONITORING, ED: Glucose-Capillary: 154 mg/dL — ABNORMAL HIGH (ref 70–99)

## 2020-11-30 MED ORDER — DOXYCYCLINE HYCLATE 100 MG PO CAPS: 100.0000 mg | ORAL_CAPSULE | Freq: Two times a day (BID) | ORAL | 0 refills | Status: AC

## 2020-11-30 MED ORDER — ALBUTEROL SULFATE HFA 108 (90 BASE) MCG/ACT IN AERS: 2.0000 | INHALATION_SPRAY | Freq: Four times a day (QID) | RESPIRATORY_TRACT | 0 refills | Status: DC | PRN

## 2020-11-30 MED ORDER — PREDNISONE 10 MG (21) PO TBPK: ORAL_TABLET | Freq: Every day | ORAL | 0 refills | Status: DC

## 2020-11-30 NOTE — ED Triage Notes (Signed)
Pt presents productive cough, flank/back pain, congestion, and bilateral rib pain for past week.

## 2020-11-30 NOTE — Discharge Instructions (Signed)
Take antibiotic 1 pill twice a [place] for 1 week.   Take the prednisone taper as prescribed.   Use the inhaler as needed for cough, shortness of breath and wheezing.  Rest and drink lots of fluids.    Return or go to the Emergency Department if symptoms worsen or do not improve in the next few days.

## 2020-11-30 NOTE — ED Provider Notes (Signed)
[facility]   [id number] 11/30/20 Arrival Time: 9735   CC: cough, cold symptoms  SUBJECTIVE: History from: patient.  Jerome Vaughn is a 70 y.o. male who presents with cough, shortness of breath, and rib pain . Denies sick exposure to COVID, flu or strep. Denies recent travel. Has taken OTC medications for this with minimal relief. There are no aggravating or alleviating factors. Denies previous symptoms in the past. Denies fever, chills, fatigue, sinus pain, rhinorrhea, sore throat, chest pain, nausea, changes in bowel or bladder habits.    ROS: As per HPI.  All other pertinent ROS negative.     Past Medical History:  Diagnosis Date  . Carpal tunnel syndrome    both hands  . Colon polyps   . Coronary artery disease    a. Cath 11/21/2015: Multivessel CAD --> CABG recommended. b. CABG on 11/22/2015:  LIMA-LAD, SVG-Diag, SVG-OM1 and distal Cx, and SVG-PDA.  . Diabetes mellitus   . GERD (gastroesophageal reflux disease)   . History of echocardiogram    a. Echo 4/17: Moderate LVH, EF 50-55%, mild LAE, no pericardial effusion  . Hyperlipidemia   . Hypertension   . Pneumonia    Past Surgical History:  Procedure Laterality Date  . CARDIAC CATHETERIZATION N/A 11/21/2015   Procedure: Left Heart Cath and Coronary Angiography;  Surgeon: [name], MD;  Location: [facility];  Service: Cardiovascular;  Laterality: N/A;  . COLONOSCOPY  02/23/2020   per Dr. Tarri Glenn, tubular adenomas, repeat in 3 yrs   . colonsocopy  12/07/2009   per Dr. [name], benign polyps, repeat in 10 yrs   . CORONARY ARTERY BYPASS GRAFT N/A 11/22/2015   Procedure: CORONARY ARTERY BYPASS GRAFTING (CABG) times five using the left internal mammary, right greater saphenous vein EVH, and left thigh greater saphenous vein EVH;  Surgeon: [name], MD;  Location: [facility];  Service: Open Heart Surgery;  Laterality: N/A;  . frozen shoulder release Right   . INTRAVASCULAR PRESSURE WIRE/FFR STUDY N/A 12/14/2018    Procedure: INTRAVASCULAR PRESSURE WIRE/FFR STUDY;  Surgeon: Jettie Booze, MD;  Location: [facility];  Service: Cardiovascular;  Laterality: N/A;  . LEFT HEART CATH AND CORS/GRAFTS ANGIOGRAPHY N/A 10/27/2017   Procedure: LEFT HEART CATH AND CORS/GRAFTS ANGIOGRAPHY;  Surgeon: [name], MD;  Location: [facility];  Service: Cardiovascular;  Laterality: N/A;  . LEFT HEART CATH AND CORS/GRAFTS ANGIOGRAPHY N/A 12/14/2018   Procedure: LEFT HEART CATH AND CORS/GRAFTS ANGIOGRAPHY;  Surgeon: Jettie Booze, MD;  Location: [facility];  Service: Cardiovascular;  Laterality: N/A;  . TEE WITHOUT CARDIOVERSION N/A 11/22/2015   Procedure: TRANSESOPHAGEAL ECHOCARDIOGRAM (TEE);  Surgeon: [name], MD;  Location: [facility];  Service: Open Heart Surgery;  Laterality: N/A;  . WRIST GANGLION EXCISION Right    No Known Allergies No current facility-administered medications on file prior to encounter.   Current Outpatient Medications on File Prior to Encounter  Medication Sig Dispense Refill  . Accu-Chek Softclix Lancets lancets 1 each by Other route daily. Use to monitor glucose levels daily; E11.8 100 each 2  . acetaminophen (TYLENOL) 500 MG tablet Take 500 mg by mouth as needed.    . APPLE CIDER VINEGAR PO Take by mouth daily.     . Ascorbic Acid (VITAMIN C PO) Take 1 tablet by mouth daily.    Marland Kitchen aspirin EC 81 MG tablet Take 1 tablet (81 mg total) by mouth daily.    Marland Kitchen  benazepril (LOTENSIN) 20 MG tablet TAKE 1 TABLET EVERY [place] 90 tablet 3  . Blood Glucose Monitoring Suppl (ACCU-CHEK AVIVA PLUS) w/Device KIT 1 each by Does not apply route daily. Use to monitor glucose levels daily; E11.8 1 kit 0  . bromocriptine (PARLODEL) 2.5 MG tablet Take 1 tablet by mouth once daily 90 tablet 0  . carvedilol (COREG) 3.125 MG tablet TAKE 1 TABLET (3.125 MG TOTAL) BY MOUTH 2 (TWO) TIMES DAILY WITH A MEAL. 180 tablet 3  . Cinnamon 500 MG capsule Take 1 capsule by mouth daily.    .  Coenzyme Q10 (COQ-10) 100 MG CAPS Take 1 tablet by mouth daily.    . Cyanocobalamin (VITAMIN B-12 PO) Take 1 tablet by mouth daily.    Marland Kitchen ezetimibe (ZETIA) 10 MG tablet TAKE ONE TABLET BY MOUTH DAILY 90 tablet 1  . ferrous sulfate 325 (65 FE) MG tablet Take 325 mg by mouth daily with breakfast.    . Garlic 4128 MG CAPS Take 1 tablet by mouth daily.    Marland Kitchen glucose blood (ACCU-CHEK AVIVA PLUS) test strip 1 each by Other route daily. Use to monitor glucose levels daily; E11.8 100 each 2  . metFORMIN (GLUCOPHAGE) 500 MG tablet TAKE 4 TABLETS BY MOUTH DAILY 360 tablet 2  . Multiple Vitamin (MULTIVITAMIN) tablet Take 1 tablet by mouth daily.    . nitroGLYCERIN (NITROSTAT) 0.4 MG SL tablet Place 1 tablet (0.4 mg total) under the tongue every 5 (five) minutes as needed for chest pain. (Patient not taking: Reported on 11/09/2020) 75 tablet 1  . Omega-3 Fatty Acids (FISH OIL PO) Take 1 capsule by mouth daily.    . pantoprazole (PROTONIX) 40 MG tablet TAKE ONE TABLET BY MOUTH TWICE A [place] 180 tablet 0  . ranolazine (RANEXA) 500 MG 12 hr tablet Take 1 tablet (500 mg total) by mouth 2 (two) times daily. 180 tablet 3  . repaglinide (PRANDIN) 0.5 MG tablet Take 1 tablet (0.5 mg total) by mouth 2 (two) times daily before a meal. 180 tablet 3  . rosuvastatin (CRESTOR) 40 MG tablet Take 1 tablet by mouth once daily 90 tablet 0  . sildenafil (VIAGRA) 100 MG tablet TAKE ONE TABLET BY MOUTH DAILY AS NEEDED FOR ERECTILE DYSFUNCTION 30 tablet 0  . VITAMIN E PO Take 1 capsule by mouth daily.     Social History   Socioeconomic History  . Marital status: Married    Spouse name: Not on file  . Number of children: 5  . Years of education: Not on file  . Highest education level: Not on file  Occupational History  . Occupation: Counsellor  Tobacco Use  . Smoking status: Former Smoker    Packs/[place]: 0.25    Years: 20.00    Pack years: 5.00    Types: Cigarettes    Quit date: 10/07/2013    Years since quitting: 7.1  .  Smokeless tobacco: Never Used  . Tobacco comment: quit cigarettes, might have a cigar 1 X month  Vaping Use  . Vaping Use: Never used  Substance and Sexual Activity  . Alcohol use: Yes    Alcohol/week: 3.0 - 4.0 standard drinks    Types: 3 - 4 Standard drinks or equivalent per week  . Drug use: No  . Sexual activity: Not Currently    Birth control/protection: None  Other Topics Concern  . 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 Difficulty of Paying Living Expenses: Not hard at all  Food Insecurity: No Food Insecurity  . Worried About Charity fundraiser in the Last Year: Never true  . Ran Out of Food in the Last Year: Never true  Transportation Needs: No Transportation Needs  . Lack of Transportation (Medical): No  . Lack of Transportation (Non-Medical): No  Physical Activity: Inactive  . Days of Exercise per Week: 0 days  . Minutes of Exercise per Session: 0 min  Stress: No Stress Concern Present  . Feeling of Stress : Not at all  Social Connections: Socially Integrated  . Frequency of Communication with Friends and Family: More than three times a week  . Frequency of Social Gatherings with Friends and Family: More than three times a week  . Attends Religious Services: More than 4 times per year  . Active Member of Clubs or Organizations: Yes  . Attends Archivist Meetings: More than 4 times per year  . Marital Status: Married  Human resources officer Violence: Not At Risk  . Fear of Current or Ex-Partner: No  . Emotionally Abused: No  . Physically Abused: No  . Sexually Abused: No   Family History  Problem Relation Age of Onset  . Heart failure Father   . Anuerysm Brother        AAA  . Hyperlipidemia Sister   . Diabetes Sister   . Hypertension  Sister   . Kidney failure Sister   . Diabetes Maternal Grandmother   . Hyperlipidemia Sister   . Diabetes Sister   . Colon polyps Sister   . Kidney disease Sister   . Other Brother        COVID 19  . Diabetes Son   . Heart disease Son   . Heart attack Neg Hx   . Colon cancer Neg Hx   . Stomach cancer Neg Hx   . Esophageal cancer Neg Hx     OBJECTIVE:  Vitals:   11/30/20 1131  BP: 129/89  Pulse: 64  Resp: 17  Temp: 97.8 F (36.6 C)  TempSrc: Oral  SpO2: 98%     General appearance: alert; appears fatigued, but nontoxic; speaking in full sentences and tolerating own secretions HEENT: NCAT; Ears: EACs clear, TMs pearly gray; Eyes: PERRL.  EOM grossly intact. Sinuses: nontender; Nose: nares patent with clear rhinorrhea, Throat: oropharynx erythematous, cobblestoning present, tonsils non erythematous or enlarged, uvula midline  Neck: supple without LAD Lungs: unlabored respirations, symmetrical air entry; cough: mild; no respiratory distress; wheezing to left upper lobe Heart: regular rate and rhythm.  Radial pulses 2+ symmetrical bilaterally Skin: warm and dry Psychological: alert and cooperative; normal mood and affect  LABS:  Results for orders placed or performed during the hospital encounter of 11/30/20 (from the past 24 hour(s))  POC Urinalysis dipstick     Status: None   Collection Time: 11/30/20 11:46 AM  Result Value Ref Range   Glucose, UA NEGATIVE NEGATIVE mg/dL   Bilirubin Urine NEGATIVE NEGATIVE   Ketones, ur NEGATIVE NEGATIVE mg/dL   Specific Gravity, Urine 1.025 1.005 - 1.030   Hgb urine dipstick NEGATIVE NEGATIVE   pH 6.0 5.0 - 8.0   Protein, ur NEGATIVE NEGATIVE mg/dL   Urobilinogen, UA 0.2 0.0 - 1.0 mg/dL   Nitrite NEGATIVE NEGATIVE  Leukocytes,Ua NEGATIVE NEGATIVE  POC CBG monitoring     Status: Abnormal   Collection Time: 11/30/20 11:49 AM  Result Value Ref Range   Glucose-Capillary 154 (H) 70 - 99 mg/dL     ASSESSMENT & PLAN:  1. Shortness  of breath   2. Cough   3. Wheezing     Meds ordered this encounter  Medications  . doxycycline (VIBRAMYCIN) 100 MG capsule    Sig: Take 1 capsule (100 mg total) by mouth 2 (two) times daily for 7 days.    Dispense:  14 capsule    Refill:  0    Order Specific Question:   Supervising Provider    Answer:   Chase Picket [id number]  . albuterol (VENTOLIN HFA) 108 (90 Base) MCG/ACT inhaler    Sig: Inhale 2 puffs into the lungs every 6 (six) hours as needed for wheezing or shortness of breath.    Dispense:  18 g    Refill:  0    Order Specific Question:   Supervising Provider    Answer:   Chase Picket [id number]  . predniSONE (STERAPRED UNI-PAK 21 TAB) 10 MG (21) TBPK tablet    Sig: Take by mouth daily. Take 6 tabs by mouth daily  for 2 days, then 5 tabs for 2 days, then 4 tabs for 2 days, then 3 tabs for 2 days, 2 tabs for 2 days, then 1 tab by mouth daily for 2 days    Dispense:  42 tablet    Refill:  0    Order Specific Question:   Supervising Provider    Answer:   Chase Picket [id number]   URI with bronchitis.  Prescribed doxycycline BID x1 week.  Albuterol and Steroid taper for symptom management.  Continue supportive care at home Get plenty of rest and push fluids Use medications daily for symptom relief Use OTC medications like ibuprofen or tylenol as needed fever or pain Call or go to the ED if you have any new or worsening symptoms such as fever, worsening cough, shortness of breath, chest tightness, chest pain, turning blue, changes in mental status.  Reviewed expectations re: course of current medical issues. Questions answered. Outlined signs and symptoms indicating need for more acute intervention. Patient verbalized understanding. After Visit Summary given.         Pearson Forster, NP 11/30/20 1250

## 2020-12-01 NOTE — Telephone Encounter (Signed)
Last OV 06/12/20. Please advise

## 2020-12-03 ENCOUNTER — Other Ambulatory Visit: Payer: Self-pay | Admitting: Family Medicine

## 2020-12-12 ENCOUNTER — Ambulatory Visit: Payer: Medicare HMO | Admitting: Endocrinology

## 2020-12-28 ENCOUNTER — Other Ambulatory Visit: Payer: Self-pay

## 2020-12-28 ENCOUNTER — Ambulatory Visit: Payer: Medicare HMO | Admitting: Endocrinology

## 2020-12-28 VITALS — BP 120/80 | HR 78 | Ht 67.0 in | Wt 190.4 lb

## 2020-12-28 DIAGNOSIS — E118 Type 2 diabetes mellitus with unspecified complications: Secondary | ICD-10-CM | POA: Diagnosis not present

## 2020-12-28 LAB — POCT GLYCOSYLATED HEMOGLOBIN (HGB A1C): Hemoglobin A1C: 9.8 % — AB (ref 4.0–5.6)

## 2020-12-28 MED ORDER — REPAGLINIDE 0.5 MG PO TABS: 0.5000 mg | ORAL_TABLET | Freq: Two times a day (BID) | ORAL | 3 refills | Status: DC

## 2020-12-28 NOTE — Patient Instructions (Addendum)
check your blood sugar once a [place].  vary the time of [place] when you check, between before the 3 meals, and at bedtime.  also check if you have symptoms of your blood sugar being too high or too low.  please keep a record of the readings and bring it to your next appointment here (or you can bring the meter itself).  You can write it on any piece of paper.  please call us sooner if your blood sugar goes below 70, or if you have a lot of readings over 200. Please continue the same 3 diabetes medications.  I have sent a prescription to your pharmacy, to refill the repaglinide.   Please come back for a follow-up appointment in 2 months.

## 2020-12-28 NOTE — Progress Notes (Signed)
Subjective:    Patient ID: Jerome Vaughn, male    DOB: [birth year], 70 y.o.   MRN: [id number]  HPI Pt returns for f/u of diabetes mellitus:  DM type: 2 Dx'ed: 9794 Complications: CAD and renal insuff.   Therapy: 3 oral meds.  DKA: never.  Severe hypoglycemia: never.  Pancreatitis: never.  SDOH: he cannot afford name brand meds Other: he has never been on insulin, but he has learned how; he stopped pioglitizone, due to edema.   Interval history: pt states he feels well in general.  Pt says cbg's vary from 100-300.  He sometimes misses meds.  He took prednisone a few weeks ago for AB.   Past Medical History:  Diagnosis Date  . Carpal tunnel syndrome    both hands  . Colon polyps   . Coronary artery disease    a. Cath 11/21/2015: Multivessel CAD --> CABG recommended. b. CABG on 11/22/2015:  LIMA-LAD, SVG-Diag, SVG-OM1 and distal Cx, and SVG-PDA.  . Diabetes mellitus   . GERD (gastroesophageal reflux disease)   . History of echocardiogram    a. Echo 4/17: Moderate LVH, EF 50-55%, mild LAE, no pericardial effusion  . Hyperlipidemia   . Hypertension   . Pneumonia     Past Surgical History:  Procedure Laterality Date  . CARDIAC CATHETERIZATION N/A 11/21/2015   Procedure: Left Heart Cath and Coronary Angiography;  Surgeon: [name], MD;  Location: [facility];  Service: Cardiovascular;  Laterality: N/A;  . COLONOSCOPY  02/23/2020   per Dr. Tarri Glenn, tubular adenomas, repeat in 3 yrs   . colonsocopy  12/07/2009   per Dr. [name], benign polyps, repeat in 10 yrs   . CORONARY ARTERY BYPASS GRAFT N/A 11/22/2015   Procedure: CORONARY ARTERY BYPASS GRAFTING (CABG) times five using the left internal mammary, right greater saphenous vein EVH, and left thigh greater saphenous vein EVH;  Surgeon: [name], MD;  Location: [facility];  Service: Open Heart Surgery;  Laterality: N/A;  . frozen shoulder release Right   . INTRAVASCULAR PRESSURE WIRE/FFR STUDY N/A 12/14/2018   Procedure:  INTRAVASCULAR PRESSURE WIRE/FFR STUDY;  Surgeon: Jettie Booze, MD;  Location: [facility];  Service: Cardiovascular;  Laterality: N/A;  . LEFT HEART CATH AND CORS/GRAFTS ANGIOGRAPHY N/A 10/27/2017   Procedure: LEFT HEART CATH AND CORS/GRAFTS ANGIOGRAPHY;  Surgeon: [name], MD;  Location: [facility];  Service: Cardiovascular;  Laterality: N/A;  . LEFT HEART CATH AND CORS/GRAFTS ANGIOGRAPHY N/A 12/14/2018   Procedure: LEFT HEART CATH AND CORS/GRAFTS ANGIOGRAPHY;  Surgeon: Jettie Booze, MD;  Location: [facility];  Service: Cardiovascular;  Laterality: N/A;  . TEE WITHOUT CARDIOVERSION N/A 11/22/2015   Procedure: TRANSESOPHAGEAL ECHOCARDIOGRAM (TEE);  Surgeon: [name], MD;  Location: [facility];  Service: Open Heart Surgery;  Laterality: N/A;  . WRIST GANGLION EXCISION Right     Social History   Socioeconomic History  . Marital status: Married    Spouse name: Not on file  . Number of children: 5  . Years of education: Not on file  . Highest education level: Not on file  Occupational History  . Occupation: Counsellor  Tobacco Use  . Smoking status: Former Smoker    Packs/[place]: 0.25    Years: 20.00    Pack years: 5.00    Types: Cigarettes    Quit date: 10/07/2013    Years since quitting: 7.2  . Smokeless tobacco: Never Used  . Tobacco comment:  quit cigarettes, might have a cigar 1 X month  Vaping Use  . Vaping Use: Never used  Substance and Sexual Activity  . Alcohol use: Yes    Alcohol/week: 3.0 - 4.0 standard drinks    Types: 3 - 4 Standard drinks or equivalent per week  . Drug use: No  . Sexual activity: Not Currently    Birth control/protection: None  Other Topics Concern  . 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  Difficulty of Paying Living Expenses: Not hard at all  Food Insecurity: No Food Insecurity  . Worried About Charity fundraiser in the Last Year: Never true  . Ran Out of Food in the Last Year: Never true  Transportation Needs: No Transportation Needs  . Lack of Transportation (Medical): No  . Lack of Transportation (Non-Medical): No  Physical Activity: Inactive  . Days of Exercise per Week: 0 days  . Minutes of Exercise per Session: 0 min  Stress: No Stress Concern Present  . Feeling of Stress : Not at all  Social Connections: Socially Integrated  . Frequency of Communication with Friends and Family: More than three times a week  . Frequency of Social Gatherings with Friends and Family: More than three times a week  . Attends Religious Services: More than 4 times per year  . Active Member of Clubs or Organizations: Yes  . Attends Archivist Meetings: More than 4 times per year  . Marital Status: Married  Human resources officer Violence: Not At Risk  . Fear of Current or Ex-Partner: No  . Emotionally Abused: No  . Physically Abused: No  . Sexually Abused: No    Current Outpatient Medications on File Prior to Visit  Medication Sig Dispense Refill  . Accu-Chek Softclix Lancets lancets 1 each by Other route daily. Use to monitor glucose levels daily; E11.8 100 each 2  . acetaminophen (TYLENOL) 500 MG tablet Take 500 mg by mouth as needed.    . APPLE CIDER VINEGAR PO Take by mouth daily.     . Ascorbic Acid (VITAMIN C PO) Take 1 tablet by mouth daily.    Marland Kitchen aspirin EC 81 MG tablet Take 1 tablet (81 mg total) by mouth daily.    . benazepril (LOTENSIN) 20 MG tablet TAKE 1 TABLET EVERY [place] 90 tablet 3  . Blood Glucose Monitoring Suppl (ACCU-CHEK AVIVA PLUS) w/Device KIT 1 each by Does not apply route daily. Use to monitor glucose levels daily; E11.8 1 kit 0  . bromocriptine (PARLODEL) 2.5 MG tablet Take 1 tablet by mouth once daily 90 tablet 0  . carvedilol (COREG) 3.125 MG tablet  TAKE 1 TABLET (3.125 MG TOTAL) BY MOUTH 2 (TWO) TIMES DAILY WITH A MEAL. 180 tablet 3  . Cinnamon 500 MG capsule Take 1 capsule by mouth daily.    . Coenzyme Q10 (COQ-10) 100 MG CAPS Take 1 tablet by mouth daily.    . Cyanocobalamin (VITAMIN B-12 PO) Take 1 tablet by mouth daily.    Marland Kitchen ezetimibe (ZETIA) 10 MG tablet TAKE ONE TABLET BY MOUTH DAILY 90 tablet 1  . ferrous sulfate 325 (65 FE) MG tablet Take 325 mg by mouth daily with breakfast.    . Garlic 5188 MG  CAPS Take 1 tablet by mouth daily.    Marland Kitchen glucose blood (ACCU-CHEK AVIVA PLUS) test strip 1 each by Other route daily. Use to monitor glucose levels daily; E11.8 100 each 2  . metFORMIN (GLUCOPHAGE) 500 MG tablet TAKE 4 TABLETS BY MOUTH DAILY 360 tablet 2  . Multiple Vitamin (MULTIVITAMIN) tablet Take 1 tablet by mouth daily.    . nitroGLYCERIN (NITROSTAT) 0.4 MG SL tablet Place 1 tablet (0.4 mg total) under the tongue every 5 (five) minutes as needed for chest pain. 75 tablet 1  . Omega-3 Fatty Acids (FISH OIL PO) Take 1 capsule by mouth daily.    . pantoprazole (PROTONIX) 40 MG tablet TAKE ONE TABLET BY MOUTH TWICE A Scheidegger 180 tablet 0  . ranolazine (RANEXA) 500 MG 12 hr tablet Take 1 tablet (500 mg total) by mouth 2 (two) times daily. 180 tablet 3  . rosuvastatin (CRESTOR) 40 MG tablet Take 1 tablet by mouth once daily 90 tablet 0  . sildenafil (VIAGRA) 100 MG tablet TAKE ONE TABLET BY MOUTH DAILY AS NEEDED FOR ERECTILE DYSFUNCTION 30 tablet 0  . VITAMIN E PO Take 1 capsule by mouth daily.    Marland Kitchen albuterol (VENTOLIN HFA) 108 (90 Base) MCG/ACT inhaler Inhale 2 puffs into the lungs every 6 (six) hours as needed for wheezing or shortness of breath. 18 g 0   No current facility-administered medications on file prior to visit.    No Known Allergies  Family History  Problem Relation Age of Onset  . Heart failure Father   . Anuerysm Brother        AAA  . Hyperlipidemia Sister   . Diabetes Sister   . Hypertension Sister   . Kidney failure  Sister   . Diabetes Maternal Grandmother   . Hyperlipidemia Sister   . Diabetes Sister   . Colon polyps Sister   . Kidney disease Sister   . Other Brother        COVID 19  . Diabetes Son   . Heart disease Son   . Heart attack Neg Hx   . Colon cancer Neg Hx   . Stomach cancer Neg Hx   . Esophageal cancer Neg Hx     BP 120/80 (BP Location: Right Arm, Patient Position: Sitting, Cuff Size: Large)   Pulse 78   Ht _0  (1.702 m)   Wt 190 lb 6.4 oz (86.4 kg)   SpO2 97%   BMI 29.82 kg/m    Review of Systems     Objective:   Physical Exam VITAL SIGNS:  See vs page GENERAL: no distress Pulses: dorsalis pedis intact bilat.   MSK: no deformity of the feet CV: 1+ bilat leg edema Skin:  no ulcer on the feet.  normal color and temp on the feet. Neuro: sensation is intact to touch on the feet.  Ext: there is bilateral onychomycosis of the toenails   Lab Results  Component Value Date   HGBA1C 9.8 (A) 12/28/2020       Assessment & Plan:  Type 2 DM, with CRI: uncontrolled Noncompliance with medications AB: steroid rx is worsening A1c  Patient Instructions  check your blood sugar once a [place].  vary the time of Alan when you check, between before the 3 meals, and at bedtime.  also check if you have symptoms of your blood sugar being too high or too low.  please keep a record of the readings and bring it to your next appointment here (or you  can bring the meter itself).  You can write it on any piece of paper.  please call us sooner if your blood sugar goes below 70, or if you have a lot of readings over 200. Please continue the same 3 diabetes medications.  I have sent a prescription to your pharmacy, to refill the repaglinide.   Please come back for a follow-up appointment in 2 months.

## 2021-01-03 ENCOUNTER — Other Ambulatory Visit: Payer: Self-pay | Admitting: Family Medicine

## 2021-01-04 ENCOUNTER — Telehealth: Payer: Self-pay | Admitting: Interventional Cardiology

## 2021-01-04 NOTE — Telephone Encounter (Signed)
Jerome Vaughn with [employer] would like to confirm that we received their fax. She states on 12/31/20 they faxed a coronary artery disease form, to be completed by Dr. [name]. She is requesting a confirmation call to ensure that it was received and is/will be completed.  Phone #: [phone] (ext: [phone])

## 2021-01-07 NOTE — Telephone Encounter (Signed)
Called [employer] to let them know I did receive forms.  Inquired why these forms needed to be filled out.  Representative stated that they need the form completed to see if they will give pt life insurance.    Called pt to clarify that he truly is seeking life insurance from [employer] Sports administrator).

## 2021-01-07 NOTE — Telephone Encounter (Signed)
Left message on Natalie's VM letting her know that I did not receive paperwork.  Provided fax number to resend.

## 2021-01-09 NOTE — Telephone Encounter (Signed)
Spoke with pt and verified that he truly is seeking life insurance from [facility].  Pt appreciative for call.

## 2021-01-17 NOTE — Telephone Encounter (Signed)
Late entry:  Paperwork completed yesterday and placed in nurse fax box.

## 2021-01-18 ENCOUNTER — Other Ambulatory Visit: Payer: Self-pay

## 2021-01-18 ENCOUNTER — Ambulatory Visit: Payer: Medicare HMO | Admitting: Podiatry

## 2021-01-18 DIAGNOSIS — M79675 Pain in left toe(s): Secondary | ICD-10-CM | POA: Diagnosis not present

## 2021-01-18 DIAGNOSIS — E118 Type 2 diabetes mellitus with unspecified complications: Secondary | ICD-10-CM | POA: Diagnosis not present

## 2021-01-18 DIAGNOSIS — B351 Tinea unguium: Secondary | ICD-10-CM | POA: Diagnosis not present

## 2021-01-18 DIAGNOSIS — M79674 Pain in right toe(s): Secondary | ICD-10-CM

## 2021-01-24 ENCOUNTER — Encounter: Payer: Self-pay | Admitting: Podiatry

## 2021-01-24 NOTE — Progress Notes (Signed)
  Subjective:  Patient ID: Jerome Vaughn, male    DOB: 06-13-1951,  MRN: [id number]  Chief Complaint  Patient presents with  . Diabetes    Nail trim    70 y.o. male returns for the above complaint.  Patient presents with complaint of thickened elongated dystrophic toenails x10.  They are painful to touch.  Patient is a diabetic with last A1c of 9.8.  He is not able to do it himself.  He would like for me to debride them now.  He denies any other acute complaints.  Objective:  There were no vitals filed for this visit. Podiatric Exam: Vascular: dorsalis pedis and posterior tibial pulses are palpable bilateral. Capillary return is immediate. Temperature gradient is WNL. Skin turgor WNL  Sensorium: Decreased Semmes Weinstein monofilament test.  Decreased tactile sensation bilaterally. Nail Exam: Pt has thick disfigured discolored nails with subungual debris noted bilateral entire nail hallux through fifth toenails.  Pain on palpation to the nails. Ulcer Exam: There is no evidence of ulcer or pre-ulcerative changes or infection. Orthopedic Exam: Muscle tone and strength are WNL. No limitations in general ROM. No crepitus or effusions noted. HAV  B/L.  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 Skin: No Porokeratosis. No infection or ulcers    Assessment & Plan:   1. Pain due to onychomycosis of toenails of both feet   2. Type 2 diabetes mellitus with complication, without long-term current use of insulin ([place])     Patient was evaluated and treated and all questions answered.  Onychomycosis with pain  -Nails palliatively debrided as below. -Educated on self-care  Procedure: Nail Debridement Rationale: pain  Type of Debridement: manual, sharp debridement. Instrumentation: Nail nipper, rotary burr. Number of Nails: 10  Procedures and Treatment: Consent by patient was obtained for treatment procedures. The patient understood the discussion of treatment and procedures well. All questions were answered  thoroughly reviewed. Debridement of mycotic and hypertrophic toenails, 1 through 5 bilateral and clearing of subungual debris. No ulceration, no infection noted.  Return Visit-Office Procedure: Patient instructed to return to the office for a follow up visit 3 months for continued evaluation and treatment.  [name], DPM    No follow-ups on file.

## 2021-01-27 ENCOUNTER — Other Ambulatory Visit: Payer: Self-pay | Admitting: Gastroenterology

## 2021-01-30 ENCOUNTER — Other Ambulatory Visit: Payer: Self-pay | Admitting: Gastroenterology

## 2021-02-07 ENCOUNTER — Other Ambulatory Visit: Payer: Self-pay | Admitting: Family Medicine

## 2021-02-21 ENCOUNTER — Telehealth: Payer: Self-pay | Admitting: Pharmacist

## 2021-02-21 NOTE — Chronic Care Management (AMB) (Signed)
Chronic Care Management Pharmacy Assistant   Name: Jerome Vaughn  MRN: [id number] DOB: [birth year]  Reason for Encounter: Disease State/ General Assessment Call.   Conditions to be addressed/monitored: CAD, HTN, HLD and DMII   Recent office visits:  None.   Recent consult visits:  01/18/21 [name] DPM (Podiatry) - presented to clinic for initial consult due to pain in toenails due to onychomycosis, no medication changes and follow up in 3 months.   12/28/20 [name] MD (Endocrinology) - seen for type 2 diabetes with complication. Discontinued prednisone. Follow up in 2 months.   10/03/20 [name] MD (Oncology) - seen for monoclonal gammopathy of unknown significance, anemia and type 2 diabetes. No medication changes. Follow up in January of 2023.  09/18/20 [name] (Optometry) -  Seen for eye exam due to type 2 diabetes. No medication changes or follow up noted.   Hospital visits:  Medication Reconciliation was completed by comparing discharge summary, patient's EMR and Pharmacy list, and upon discussion with patient.  Patient visited [facility] for 1 hour on 11/30/20 due to shortness of breath, coughing and wheezing.   New?Medications Started at [facility] Discharge:?? -started on Albuterol sulfate inhaler every 6 hours as needed, doxycycline 180m twice daily and prednisone 139mdose pak.   Medication Changes at Hospital Discharge: None.  Medications Discontinued at Hospital Discharge: None.   Medications that remain the same after Hospital Discharge:??  -All other medications will remain the same.    Medications: Outpatient Encounter Medications as of 02/21/2021  Medication Sig  . Accu-Chek Softclix Lancets lancets 1 each by Other route daily. Use to monitor glucose levels daily; E11.8  . acetaminophen (TYLENOL) 500 MG tablet Take 500 mg by mouth as needed.  . Marland Kitchenlbuterol (VENTOLIN HFA) 108 (90 Base) MCG/ACT inhaler Inhale 2 puffs into the lungs  every 6 (six) hours as needed for wheezing or shortness of breath.  . APPLE CIDER VINEGAR PO Take by mouth daily.   . Ascorbic Acid (VITAMIN C PO) Take 1 tablet by mouth daily.  . Marland Kitchenspirin EC 81 MG tablet Take 1 tablet (81 mg total) by mouth daily.  . benazepril (LOTENSIN) 20 MG tablet TAKE 1 TABLET EVERY [place]  . Blood Glucose Monitoring Suppl (ACCU-CHEK AVIVA PLUS) w/Device KIT 1 each by Does not apply route daily. Use to monitor glucose levels daily; E11.8  . bromocriptine (PARLODEL) 2.5 MG tablet Take 1 tablet by mouth once daily  . carvedilol (COREG) 3.125 MG tablet TAKE 1 TABLET (3.125 MG TOTAL) BY MOUTH 2 (TWO) TIMES DAILY WITH A MEAL.  . Marland Kitcheninnamon 500 MG capsule Take 1 capsule by mouth daily.  . Coenzyme Q10 (COQ-10) 100 MG CAPS Take 1 tablet by mouth daily.  . Cyanocobalamin (VITAMIN B-12 PO) Take 1 tablet by mouth daily.  . Marland Kitchenzetimibe (ZETIA) 10 MG tablet TAKE ONE TABLET BY MOUTH DAILY  . ferrous sulfate 325 (65 FE) MG tablet Take 325 mg by mouth daily with breakfast.  . Garlic 101224G CAPS Take 1 tablet by mouth daily.  . Marland Kitchenlucose blood (ACCU-CHEK AVIVA PLUS) test strip 1 each by Other route daily. Use to monitor glucose levels daily; E11.8  . metFORMIN (GLUCOPHAGE) 500 MG tablet TAKE 4 TABLETS BY MOUTH DAILY  . Multiple Vitamin (MULTIVITAMIN) tablet Take 1 tablet by mouth daily.  . nitroGLYCERIN (NITROSTAT) 0.4 MG SL tablet Place 1 tablet (0.4 mg total) under the tongue every 5 (five) minutes as needed for chest pain.  .Marland Kitchen  Omega-3 Fatty Acids (FISH OIL PO) Take 1 capsule by mouth daily.  . pantoprazole (PROTONIX) 40 MG tablet TAKE ONE TABLET BY MOUTH TWICE A Wegner  . ranolazine (RANEXA) 500 MG 12 hr tablet Take 1 tablet (500 mg total) by mouth 2 (two) times daily.  . repaglinide (PRANDIN) 0.5 MG tablet Take 1 tablet (0.5 mg total) by mouth 2 (two) times daily before a meal.  . rosuvastatin (CRESTOR) 40 MG tablet Take 1 tablet by mouth once daily  . sildenafil (VIAGRA) 100 MG tablet TAKE ONE  TABLET BY MOUTH DAILY AS NEEDED FOR ERECTILE DYSFUNCTION  . VITAMIN E PO Take 1 capsule by mouth daily.   No facility-administered encounter medications on file as of 02/21/2021.    Recent Relevant Labs: Lab Results  Component Value Date/Time   HGBA1C 9.8 (A) 12/28/2020 10:43 AM   HGBA1C 6.3 (A) 06/12/2020 09:24 AM   HGBA1C 7.6 (H) 11/21/2015 06:20 PM   HGBA1C 7.1 (H) 02/19/2015 02:35 PM   MICROALBUR 1.2 08/22/2014 10:34 AM   MICROALBUR 1.6 03/28/2011 11:41 AM    Kidney Function Lab Results  Component Value Date/Time   CREATININE 1.41 (H) 09/26/2020 03:11 PM   CREATININE 1.54 (H) 08/06/2020 03:26 PM   CREATININE 1.31 (H) 06/14/2020 02:19 PM   CREATININE 1.19 11/14/2015 11:35 AM   GFR 81.97 04/11/2019 09:24 AM   GFRNONAA 55 (L) 09/26/2020 03:11 PM   GFRAA >60 06/14/2020 02:19 PM    . Current antihyperglycemic regimen:  o repaglinide 0.13m - take 1 tablet twice daily.  o Metformin 50422m- take 4 tablets by mouth daily.  o Bromocriptine 2.22m49m take 1 tablet once daily.   . What recent interventions/DTPs have been made to improve glycemic control:  o None.   . Have there been any recent hospitalizations or ED visits since last visit with CPP? No  . What are your blood sugars ranging?  o Fasting:  o Before meals:  o After meals:  o Bedtime:   . During the week, how often does your blood glucose drop below 70?   . AMarland Kitchene you checking your feet daily/regularly?   Adherence Review: Is the patient currently on a STATIN medication? Yes Is the patient currently on ACE/ARB medication? Yes Does the patient have >5 [place] gap between last estimated fill dates? No  Reviewed chart prior to disease state call. Spoke with patient regarding BP  Recent Office Vitals: BP Readings from Last 3 Encounters:  12/28/20 120/80  11/30/20 129/89  11/09/20 126/80   Pulse Readings from Last 3 Encounters:  12/28/20 78  11/30/20 64  11/09/20 90    Wt Readings from Last 3 Encounters:   12/28/20 190 lb 6.4 oz (86.4 kg)  11/09/20 190 lb 7 oz (86.4 kg)  10/03/20 190 lb (86.2 kg)     Kidney Function Lab Results  Component Value Date/Time   CREATININE 1.41 (H) 09/26/2020 03:11 PM   CREATININE 1.54 (H) 08/06/2020 03:26 PM   CREATININE 1.31 (H) 06/14/2020 02:19 PM   CREATININE 1.19 11/14/2015 11:35 AM   GFR 81.97 04/11/2019 09:24 AM   GFRNONAA 55 (L) 09/26/2020 03:11 PM   GFRAA >60 06/14/2020 02:19 PM    BMP Latest Ref Rng & Units 09/26/2020 08/06/2020 06/14/2020  Glucose 70 - 99 mg/dL 164(H) 193(H) 76  BUN 8 - 23 mg/dL '15 21 16  ' Creatinine 0.61 - 1.24 mg/dL 1.41(H) 1.54(H) 1.31(H)  BUN/Creat Ratio 10 - 24 - - -  Sodium 135 - 145 mmol/L 136  137 139  Potassium 3.5 - 5.1 mmol/L 4.1 4.7 4.1  Chloride 98 - 111 mmol/L 104 105 106  CO2 22 - 32 mmol/L '27 24 24  ' Calcium 8.9 - 10.3 mg/dL 10.0 9.5 9.4    . Current antihypertensive regimen:  o Benazepril 558m - take 1 tablet daily.  o Carvedilol 3.1260m-  Take 1 tablet twice daily with a meal.  . How often are you checking your Blood Pressure?   . Current home BP readings:   . What recent interventions/DTPs have been made by any provider to improve Blood Pressure control since last CPP Visit: None.   . Any recent hospitalizations or ED visits since last visit with CPP? No  . What diet changes have been made to improve Blood Pressure Control?  o   . What exercise is being done to improve your Blood Pressure Control? o   Adherence Review: Is the patient currently on ACE/ARB medication? Yes Does the patient have >5 [place] gap between last estimated fill dates? No  Multiple unsuccessful attempts to reach patient by phone.   Star Rating Drugs:   repaglinide 0.58m42m [facility] [phone]

## 2021-02-22 NOTE — Telephone Encounter (Cosign Needed)
2 attempts.

## 2021-02-26 NOTE — Telephone Encounter (Cosign Needed)
3rd attempt

## 2021-03-01 ENCOUNTER — Ambulatory Visit: Payer: Medicare HMO | Admitting: Endocrinology

## 2021-03-04 ENCOUNTER — Telehealth: Payer: Self-pay | Admitting: Pharmacist

## 2021-03-04 NOTE — Chronic Care Management (AMB) (Signed)
Chronic Care Management Pharmacy Assistant   Name: Jerome Vaughn  MRN: [id number] DOB: Jan 27, 1951  Reason for Encounter: Disease State/ Diabetes Assessment Call.    Conditions to be addressed/monitored: DMII  Recent office visits:  None.   Recent consult visits:  01/18/21 [name] DPM (Podiatry) - presented to clinic for initial consult due to pain in toenails due to onychomycosis, no medication changes and follow up in 3 months.    12/28/20 [name] MD (Endocrinology) - seen for type 2 diabetes with complication. Discontinued prednisone. Follow up in 2 months.    10/03/20 [name] MD (Oncology) - seen for monoclonal gammopathy of unknown significance, anemia and type 2 diabetes. No medication changes. Follow up in January of 2023.   09/18/20 [name] (Optometry) -  Seen for eye exam due to type 2 diabetes. No medication changes or follow up noted.    Hospital visits:  Medication Reconciliation was completed by comparing discharge summary, patient's EMR and Pharmacy list, and upon discussion with patient.   Patient visited [facility] for 1 hour on 11/30/20 due to shortness of breath, coughing and wheezing.    New?Medications Started at [facility] Discharge:?? -started on Albuterol sulfate inhaler every 6 hours as needed, doxycycline 165m twice daily and prednisone 141mdose pak.    Medication Changes at Hospital Discharge: None.   Medications Discontinued at Hospital Discharge: None.    Medications that remain the same after Hospital Discharge:??  -All other medications will remain the same.      Medications: Outpatient Encounter Medications as of 03/04/2021  Medication Sig   Accu-Chek Softclix Lancets lancets 1 each by Other route daily. Use to monitor glucose levels daily; E11.8   acetaminophen (TYLENOL) 500 MG tablet Take 500 mg by mouth as needed.   albuterol (VENTOLIN HFA) 108 (90 Base) MCG/ACT inhaler Inhale 2 puffs into the lungs every 6 (six)  hours as needed for wheezing or shortness of breath.   APPLE CIDER VINEGAR PO Take by mouth daily.    Ascorbic Acid (VITAMIN C PO) Take 1 tablet by mouth daily.   aspirin EC 81 MG tablet Take 1 tablet (81 mg total) by mouth daily.   benazepril (LOTENSIN) 20 MG tablet TAKE 1 TABLET EVERY [place]   Blood Glucose Monitoring Suppl (ACCU-CHEK AVIVA PLUS) w/Device KIT 1 each by Does not apply route daily. Use to monitor glucose levels daily; E11.8   bromocriptine (PARLODEL) 2.5 MG tablet Take 1 tablet by mouth once daily   carvedilol (COREG) 3.125 MG tablet TAKE 1 TABLET (3.125 MG TOTAL) BY MOUTH 2 (TWO) TIMES DAILY WITH A MEAL.   Cinnamon 500 MG capsule Take 1 capsule by mouth daily.   Coenzyme Q10 (COQ-10) 100 MG CAPS Take 1 tablet by mouth daily.   Cyanocobalamin (VITAMIN B-12 PO) Take 1 tablet by mouth daily.   ezetimibe (ZETIA) 10 MG tablet TAKE ONE TABLET BY MOUTH DAILY   ferrous sulfate 325 (65 FE) MG tablet Take 325 mg by mouth daily with breakfast.   Garlic 106945G CAPS Take 1 tablet by mouth daily.   glucose blood (ACCU-CHEK AVIVA PLUS) test strip 1 each by Other route daily. Use to monitor glucose levels daily; E11.8   metFORMIN (GLUCOPHAGE) 500 MG tablet TAKE 4 TABLETS BY MOUTH DAILY   Multiple Vitamin (MULTIVITAMIN) tablet Take 1 tablet by mouth daily.   nitroGLYCERIN (NITROSTAT) 0.4 MG SL tablet Place 1 tablet (0.4 mg total) under the tongue every 5 (five) minutes  as needed for chest pain.   Omega-3 Fatty Acids (FISH OIL PO) Take 1 capsule by mouth daily.   pantoprazole (PROTONIX) 40 MG tablet TAKE ONE TABLET BY MOUTH TWICE A Pons   ranolazine (RANEXA) 500 MG 12 hr tablet Take 1 tablet (500 mg total) by mouth 2 (two) times daily.   repaglinide (PRANDIN) 0.5 MG tablet Take 1 tablet (0.5 mg total) by mouth 2 (two) times daily before a meal.   rosuvastatin (CRESTOR) 40 MG tablet Take 1 tablet by mouth once daily   sildenafil (VIAGRA) 100 MG tablet TAKE ONE TABLET BY MOUTH DAILY AS NEEDED FOR  ERECTILE DYSFUNCTION   VITAMIN E PO Take 1 capsule by mouth daily.   No facility-administered encounter medications on file as of 03/04/2021.   Recent Relevant Labs: Lab Results  Component Value Date/Time   HGBA1C 9.8 (A) 12/28/2020 10:43 AM   HGBA1C 6.3 (A) 06/12/2020 09:24 AM   HGBA1C 7.6 (H) 11/21/2015 06:20 PM   HGBA1C 7.1 (H) 02/19/2015 02:35 PM   MICROALBUR 1.2 08/22/2014 10:34 AM   MICROALBUR 1.6 03/28/2011 11:41 AM    Kidney Function Lab Results  Component Value Date/Time   CREATININE 1.41 (H) 09/26/2020 03:11 PM   CREATININE 1.54 (H) 08/06/2020 03:26 PM   CREATININE 1.31 (H) 06/14/2020 02:19 PM   CREATININE 1.19 11/14/2015 11:35 AM   GFR 81.97 04/11/2019 09:24 AM   GFRNONAA 55 (L) 09/26/2020 03:11 PM   GFRAA >60 06/14/2020 02:19 PM    Current antihyperglycemic regimen:  repaglinide 0.57m - take 1 tablet twice daily.  Metformin 5070m- take 4 tablets by mouth daily.  Bromocriptine 2.29m34m take 1 tablet once daily.     What recent interventions/DTPs have been made to improve glycemic control:  None.   Have there been any recent hospitalizations or ED visits since last visit with CPP? No   Are you checking your feet daily/regularly?   Adherence Review: Is the patient currently on a STATIN medication? Yes Is the patient currently on ACE/ARB medication? Yes Does the patient have >5 Winkowski gap between last estimated fill dates? Yes  Multiple unsuccessful attempts to reach patient by phone.   Star Rating Drugs:  repaglinide 0.29mg2mlast [facility]02[facility] on 10/13/20 [facility] on [facility]/20 [phone]

## 2021-03-05 ENCOUNTER — Other Ambulatory Visit: Payer: Self-pay | Admitting: Interventional Cardiology

## 2021-03-05 ENCOUNTER — Other Ambulatory Visit: Payer: Self-pay | Admitting: Endocrinology

## 2021-03-05 NOTE — Telephone Encounter (Cosign Needed)
2nd attempt

## 2021-03-06 NOTE — Telephone Encounter (Cosign Needed)
3rd attempt

## 2021-03-15 ENCOUNTER — Other Ambulatory Visit: Payer: Self-pay | Admitting: *Deleted

## 2021-03-15 MED ORDER — CARVEDILOL 3.125 MG PO TABS: 3.1250 mg | ORAL_TABLET | Freq: Two times a day (BID) | ORAL | 3 refills | Status: DC

## 2021-03-18 ENCOUNTER — Other Ambulatory Visit: Payer: Self-pay | Admitting: *Deleted

## 2021-03-18 MED ORDER — BENAZEPRIL HCL 20 MG PO TABS: 20.0000 mg | ORAL_TABLET | Freq: Every day | ORAL | 1 refills | Status: DC

## 2021-03-20 ENCOUNTER — Other Ambulatory Visit: Payer: Self-pay | Admitting: Gastroenterology

## 2021-04-09 ENCOUNTER — Telehealth: Payer: Self-pay | Admitting: Pharmacist

## 2021-04-09 NOTE — Chronic Care Management (AMB) (Signed)
Chronic Care Management Pharmacy Assistant   Name: Jerome Vaughn  MRN: [id number] DOB: 1951-01-28  Reason for Encounter: Disease State/ Diabetes Assessment Call.    Conditions to be addressed/monitored: DMII   Recent office visits:  None.     Recent consult visits:  01/18/21 Jerome Vaughn DPM (Podiatry) - presented to clinic for initial consult due to pain in toenails due to onychomycosis, no medication changes and follow up in 3 months.   12/28/20 Jerome Shin MD (Endocrinology) - seen for type 2 diabetes with complication. Discontinued prednisone. Follow up in 2 months.   10/03/20 Jerome Del MD (Oncology) - seen for monoclonal gammopathy of unknown significance, anemia and type 2 diabetes. No medication changes. Follow up in January of 2023.   09/18/20 Jerome Vaughn (Optometry) -  Seen for eye exam due to type 2 diabetes. No medication changes or follow up noted.  Hospital visits:  Medication Reconciliation was completed by comparing discharge summary, patient's EMR and Pharmacy list, and upon discussion with patient.   Patient visited [facility] for 1 hour on 11/30/20 due to shortness of breath, coughing and wheezing.    New?Medications Started at [facility] Discharge:?? -started on Albuterol sulfate inhaler every 6 hours as needed, doxycycline 159m twice daily and prednisone 17mdose pak.    Medication Changes at Hospital Discharge: None.   Medications Discontinued at Hospital Discharge: None.    Medications that remain the same after Hospital Discharge:?? -All other medications will remain the same.       Medications: Outpatient Encounter Medications as of 04/09/2021  Medication Sig   Accu-Chek Softclix Lancets lancets 1 each by Other route daily. Use to monitor glucose levels daily; E11.8   acetaminophen (TYLENOL) 500 MG tablet Take 500 mg by mouth as needed.   albuterol (VENTOLIN HFA) 108 (90 Base) MCG/ACT inhaler Inhale 2 puffs into the lungs every 6  (six) hours as needed for wheezing or shortness of breath.   APPLE CIDER VINEGAR PO Take by mouth daily.    Ascorbic Acid (VITAMIN C PO) Take 1 tablet by mouth daily.   aspirin EC 81 MG tablet Take 1 tablet (81 mg total) by mouth daily.   benazepril (LOTENSIN) 20 MG tablet Take 1 tablet (20 mg total) by mouth daily.   Blood Glucose Monitoring Suppl (ACCU-CHEK AVIVA PLUS) w/Device KIT 1 each by Does not apply route daily. Use to monitor glucose levels daily; E11.8   bromocriptine (PARLODEL) 2.5 MG tablet TAKE ONE TABLET BY MOUTH DAILY   carvedilol (COREG) 3.125 MG tablet Take 1 tablet (3.125 mg total) by mouth 2 (two) times daily with a meal.   Cinnamon 500 MG capsule Take 1 capsule by mouth daily.   Coenzyme Q10 (COQ-10) 100 MG CAPS Take 1 tablet by mouth daily.   Cyanocobalamin (VITAMIN B-12 PO) Take 1 tablet by mouth daily.   ezetimibe (ZETIA) 10 MG tablet TAKE ONE TABLET BY MOUTH DAILY   ferrous sulfate 325 (65 FE) MG tablet Take 325 mg by mouth daily with breakfast.   Garlic 102297G CAPS Take 1 tablet by mouth daily.   glucose blood (ACCU-CHEK AVIVA PLUS) test strip 1 each by Other route daily. Use to monitor glucose levels daily; E11.8   metFORMIN (GLUCOPHAGE) 500 MG tablet TAKE 4 TABLETS BY MOUTH DAILY   Multiple Vitamin (MULTIVITAMIN) tablet Take 1 tablet by mouth daily.   nitroGLYCERIN (NITROSTAT) 0.4 MG SL tablet Place 1 tablet (0.4 mg total) under the tongue every 5 (  five) minutes as needed for chest pain.   Omega-3 Fatty Acids (FISH OIL PO) Take 1 capsule by mouth daily.   pantoprazole (PROTONIX) 40 MG tablet TAKE ONE TABLET BY MOUTH TWICE A [place]   ranolazine (RANEXA) 500 MG 12 hr tablet Take 1 tablet (500 mg total) by mouth 2 (two) times daily. Patient needs to call and schedule a one year follow up appointment for further refills 1st attempt   repaglinide (PRANDIN) 0.5 MG tablet Take 1 tablet (0.5 mg total) by mouth 2 (two) times daily before a meal.   rosuvastatin (CRESTOR) 40 MG  tablet Take 1 tablet by mouth once daily   sildenafil (VIAGRA) 100 MG tablet TAKE ONE TABLET BY MOUTH DAILY AS NEEDED FOR ERECTILE DYSFUNCTION   VITAMIN E PO Take 1 capsule by mouth daily.   No facility-administered encounter medications on file as of 04/09/2021.   Recent Relevant Labs: Lab Results  Component Value Date/Time   HGBA1C 9.8 (A) 12/28/2020 10:43 AM   HGBA1C 6.3 (A) 06/12/2020 09:24 AM   HGBA1C 7.6 (H) 11/21/2015 06:20 PM   HGBA1C 7.1 (H) 02/19/2015 02:35 PM   MICROALBUR 1.2 08/22/2014 10:34 AM   MICROALBUR 1.6 03/28/2011 11:41 AM    Kidney Function Lab Results  Component Value Date/Time   CREATININE 1.41 (H) 09/26/2020 03:11 PM   CREATININE 1.54 (H) 08/06/2020 03:26 PM   CREATININE 1.31 (H) 06/14/2020 02:19 PM   CREATININE 1.19 11/14/2015 11:35 AM   GFR 81.97 04/11/2019 09:24 AM   GFRNONAA 55 (L) 09/26/2020 03:11 PM   GFRAA >60 06/14/2020 02:19 PM    Current antihyperglycemic regimen:  repaglinide 0.30m - take 1 tablet twice daily. Metformin 5042m- take 4 tablets by mouth daily. Bromocriptine 2.67m3m take 1 tablet once daily.  What recent interventions/DTPs have been made to improve glycemic control:  None.  Have there been any recent hospitalizations or ED visits since last visit with CPP? Yes Patient hypoglycemic symptoms, including  Patient hyperglycemic symptoms, including  How often are you checking your blood sugar?  What are your blood sugars ranging?  Fasting:  Before meals:  After meals:  Bedtime:  During the week, how often does your blood glucose drop below 70?  Are you checking your feet daily/regularly?   Adherence Review: Is the patient currently on a STATIN medication? Yes Is the patient currently on ACE/ARB medication? Yes Does the patient have >5 Coate gap between last estimated fill dates? Yes  Notes: Sp[facility] with [name] at at [facility] stated that repaglinide was actually last filled on 04/05/21 for a 90 Upson  supply. Spoke with EddLudwig Clarksd GyaMarcie [facility] sated that patients fill date below for rosuvastatin is accurate as listed. Patient is overdue on filling that medication. Spoke with DahOdis Hollingshead [facility] verified that the last fill dates for patients benazepril is actually 03/19/21 for 90 Gauthier supply and metformin was 03/14/21 for a 90 Lorenzi supply.  Multiple attempts to reach patient by phone.  Care Gaps:  Covid -19 dose 3 Pfizer - overdue since 01/02/20 Tetanus/TDAP - overdue since 03/27/21  Star Rating Drugs:  Ben[facility]epril 53m23mlast [facility]11[facility] on [facility]/13/[facility] -13[facility]st filled on [facility]/02/21 [facility] on 07/27/20 [phone]

## 2021-04-10 ENCOUNTER — Inpatient Hospital Stay: Payer: Medicare HMO | Attending: Oncology

## 2021-04-10 ENCOUNTER — Other Ambulatory Visit: Payer: Self-pay

## 2021-04-10 DIAGNOSIS — E118 Type 2 diabetes mellitus with unspecified complications: Secondary | ICD-10-CM

## 2021-04-10 DIAGNOSIS — D509 Iron deficiency anemia, unspecified: Secondary | ICD-10-CM | POA: Insufficient documentation

## 2021-04-10 DIAGNOSIS — Z79899 Other long term (current) drug therapy: Secondary | ICD-10-CM | POA: Diagnosis not present

## 2021-04-10 DIAGNOSIS — D472 Monoclonal gammopathy: Secondary | ICD-10-CM

## 2021-04-10 DIAGNOSIS — D500 Iron deficiency anemia secondary to blood loss (chronic): Secondary | ICD-10-CM

## 2021-04-10 DIAGNOSIS — D508 Other iron deficiency anemias: Secondary | ICD-10-CM

## 2021-04-10 LAB — CMP (CANCER CENTER ONLY)
ALT: 17 U/L (ref 0–44)
AST: 17 U/L (ref 15–41)
Albumin: 3.8 g/dL (ref 3.5–5.0)
Alkaline Phosphatase: 33 U/L — ABNORMAL LOW (ref 38–126)
Anion gap: 10 (ref 5–15)
BUN: 16 mg/dL (ref 8–23)
CO2: 24 mmol/L (ref 22–32)
Calcium: 9.6 mg/dL (ref 8.9–10.3)
Chloride: 104 mmol/L (ref 98–111)
Creatinine: 1.32 mg/dL — ABNORMAL HIGH (ref 0.61–1.24)
GFR, Estimated: 59 mL/min — ABNORMAL LOW (ref >60)
Glucose, Bld: 189 mg/dL — ABNORMAL HIGH (ref 70–99)
Potassium: 4.5 mmol/L (ref 3.5–5.1)
Sodium: 138 mmol/L (ref 135–145)
Total Bilirubin: 0.4 mg/dL (ref 0.3–1.2)
Total Protein: 8.2 g/dL — ABNORMAL HIGH (ref 6.5–8.1)

## 2021-04-10 LAB — FERRITIN: Ferritin: 25 ng/mL (ref 24–336)

## 2021-04-10 LAB — CBC WITH DIFFERENTIAL (CANCER CENTER ONLY)
Abs Immature Granulocytes: 0.01 10*3/uL (ref 0.00–0.07)
Basophils Absolute: 0 10*3/uL (ref 0.0–0.1)
Basophils Relative: 0 %
Eosinophils Absolute: 0.1 10*3/uL (ref 0.0–0.5)
Eosinophils Relative: 2 %
HCT: 37.5 % — ABNORMAL LOW (ref 39.0–52.0)
Hemoglobin: 13.2 g/dL (ref 13.0–17.0)
Immature Granulocytes: 0 %
Lymphocytes Relative: 39 %
Lymphs Abs: 1.3 10*3/uL (ref 0.7–4.0)
MCH: 31.2 pg (ref 26.0–34.0)
MCHC: 35.2 g/dL (ref 30.0–36.0)
MCV: 88.7 fL (ref 80.0–100.0)
Monocytes Absolute: 0.5 10*3/uL (ref 0.1–1.0)
Monocytes Relative: 15 %
Neutro Abs: 1.5 10*3/uL — ABNORMAL LOW (ref 1.7–7.7)
Neutrophils Relative %: 44 %
Platelet Count: 152 10*3/uL (ref 150–400)
RBC: 4.23 MIL/uL (ref 4.22–5.81)
RDW: 12.6 % (ref 11.5–15.5)
WBC Count: 3.3 10*3/uL — ABNORMAL LOW (ref 4.0–10.5)
nRBC: 0 % (ref 0.0–0.2)

## 2021-04-10 LAB — SAVE SMEAR(SSMR), FOR PROVIDER SLIDE REVIEW

## 2021-04-11 LAB — MULTIPLE MYELOMA PANEL, SERUM
Albumin SerPl Elph-Mcnc: 4.2 g/dL (ref 2.9–4.4)
Albumin/Glob SerPl: 1.2 (ref 0.7–1.7)
Alpha 1: 0.2 g/dL (ref 0.0–0.4)
Alpha2 Glob SerPl Elph-Mcnc: 0.7 g/dL (ref 0.4–1.0)
B-Globulin SerPl Elph-Mcnc: 1 g/dL (ref 0.7–1.3)
Gamma Glob SerPl Elph-Mcnc: 1.7 g/dL (ref 0.4–1.8)
Globulin, Total: 3.6 g/dL (ref 2.2–3.9)
IgA: 103 mg/dL (ref 61–437)
IgG (Immunoglobin G), Serum: 1833 mg/dL — ABNORMAL HIGH (ref 603–1613)
IgM (Immunoglobulin M), Srm: 30 mg/dL (ref 20–172)
M Protein SerPl Elph-Mcnc: 1.1 g/dL — ABNORMAL HIGH
Total Protein ELP: 7.8 g/dL (ref 6.0–8.5)

## 2021-04-11 LAB — KAPPA/LAMBDA LIGHT CHAINS
Kappa free light chain: 19.8 mg/L — ABNORMAL HIGH (ref 3.3–19.4)
Kappa, lambda light chain ratio: 0.89 (ref 0.26–1.65)
Lambda free light chains: 22.3 mg/L (ref 5.7–26.3)

## 2021-04-11 NOTE — Telephone Encounter (Cosign Needed)
2nd attempt

## 2021-04-12 NOTE — Telephone Encounter (Cosign Needed)
3rd attempt

## 2021-04-16 NOTE — Telephone Encounter (Cosign Needed)
Left final vm. Patient called back.

## 2021-04-24 ENCOUNTER — Other Ambulatory Visit: Payer: Self-pay | Admitting: *Deleted

## 2021-04-24 MED ORDER — RANOLAZINE ER 500 MG PO TB12: 500.0000 mg | ORAL_TABLET | Freq: Two times a day (BID) | ORAL | 4 refills | Status: DC

## 2021-05-24 ENCOUNTER — Other Ambulatory Visit: Payer: Self-pay

## 2021-05-24 ENCOUNTER — Ambulatory Visit: Payer: Medicare HMO | Admitting: Podiatry

## 2021-05-24 DIAGNOSIS — E119 Type 2 diabetes mellitus without complications: Secondary | ICD-10-CM | POA: Diagnosis not present

## 2021-05-24 DIAGNOSIS — Z0100 Encounter for examination of eyes and vision without abnormal findings: Secondary | ICD-10-CM | POA: Diagnosis not present

## 2021-05-30 ENCOUNTER — Other Ambulatory Visit: Payer: Self-pay

## 2021-05-30 DIAGNOSIS — K219 Gastro-esophageal reflux disease without esophagitis: Secondary | ICD-10-CM

## 2021-05-30 MED ORDER — PANTOPRAZOLE SODIUM 40 MG PO TBEC: 40.0000 mg | DELAYED_RELEASE_TABLET | Freq: Two times a day (BID) | ORAL | 3 refills | Status: DC

## 2021-05-31 ENCOUNTER — Other Ambulatory Visit: Payer: Self-pay | Admitting: Endocrinology

## 2021-05-31 ENCOUNTER — Other Ambulatory Visit: Payer: Self-pay

## 2021-05-31 MED ORDER — EZETIMIBE 10 MG PO TABS: 10.0000 mg | ORAL_TABLET | Freq: Every day | ORAL | 0 refills | Status: DC

## 2021-05-31 NOTE — Telephone Encounter (Signed)
   Notes to clinic: Patient has appt on 06/14/2021 Review for another refill  Last script states that patient must been seen    Requested Prescriptions  Pending Prescriptions Disp Refills   bromocriptine (PARLODEL) 2.5 MG tablet [Pharmacy Med Name: BROMOCRIPTINE 2.5 MG TABLET] 90 tablet 0    Sig: TAKE ONE TABLET BY MOUTH DAILY; **MUST CALL MD FOR APPOINTMENT FOR REFILLS     There is no refill protocol information for this order

## 2021-06-10 ENCOUNTER — Other Ambulatory Visit: Payer: Self-pay | Admitting: Endocrinology

## 2021-06-10 DIAGNOSIS — E118 Type 2 diabetes mellitus with unspecified complications: Secondary | ICD-10-CM

## 2021-06-11 NOTE — Telephone Encounter (Signed)
   Notes to clinic:  Patient has appt on 06/14/2021 Should have enough medication until that appt Review to fill    Requested Prescriptions  Pending Prescriptions Disp Refills   metFORMIN (GLUCOPHAGE) 500 MG tablet [Pharmacy Med Name: METFORMIN HYDROCHLORIDE 500 MG Tablet] 360 tablet 2    Sig: TAKE 4 TABLETS BY MOUTH DAILY     There is no refill protocol information for this order

## 2021-06-14 ENCOUNTER — Other Ambulatory Visit: Payer: Self-pay

## 2021-06-14 ENCOUNTER — Ambulatory Visit (INDEPENDENT_AMBULATORY_CARE_PROVIDER_SITE_OTHER): Payer: Medicare HMO | Admitting: Endocrinology

## 2021-06-14 ENCOUNTER — Ambulatory Visit (INDEPENDENT_AMBULATORY_CARE_PROVIDER_SITE_OTHER): Payer: Medicare HMO | Admitting: Podiatry

## 2021-06-14 ENCOUNTER — Telehealth: Payer: Self-pay | Admitting: Family Medicine

## 2021-06-14 VITALS — BP 160/96 | HR 71 | Ht 67.0 in | Wt 201.4 lb

## 2021-06-14 DIAGNOSIS — M79674 Pain in right toe(s): Secondary | ICD-10-CM

## 2021-06-14 DIAGNOSIS — E118 Type 2 diabetes mellitus with unspecified complications: Secondary | ICD-10-CM | POA: Diagnosis not present

## 2021-06-14 DIAGNOSIS — B351 Tinea unguium: Secondary | ICD-10-CM | POA: Diagnosis not present

## 2021-06-14 DIAGNOSIS — M79675 Pain in left toe(s): Secondary | ICD-10-CM | POA: Diagnosis not present

## 2021-06-14 LAB — POCT GLYCOSYLATED HEMOGLOBIN (HGB A1C): Hemoglobin A1C: 6.9 % — AB (ref 4.0–5.6)

## 2021-06-14 MED ORDER — ROSUVASTATIN CALCIUM 40 MG PO TABS: 40.0000 mg | ORAL_TABLET | Freq: Every day | ORAL | 0 refills | Status: DC

## 2021-06-14 MED ORDER — BROMOCRIPTINE MESYLATE 2.5 MG PO TABS: 2.5000 mg | ORAL_TABLET | Freq: Every day | ORAL | 3 refills | Status: DC

## 2021-06-14 MED ORDER — METFORMIN HCL ER 500 MG PO TB24: 2000.0000 mg | ORAL_TABLET | Freq: Every day | ORAL | 3 refills | Status: DC

## 2021-06-14 NOTE — Telephone Encounter (Signed)
Pt call and need a refill on his rosuvastatin (CRESTOR) 40 MG tablet  sent to  [facility] - [place], [place] Phone:  [phone]  Fax:  [phone]

## 2021-06-14 NOTE — Progress Notes (Signed)
Subjective:    Patient ID: Jerome Vaughn, male    DOB: [birth year], 70 y.o.   MRN: [id number]  HPI Pt returns for f/u of diabetes mellitus:  DM type: 2 Dx'ed: 4628 Complications: CAD and renal insuff.   Therapy: 3 oral meds.  DKA: never.  Severe hypoglycemia: never.  Pancreatitis: never.  SDOH: he cannot afford name brand meds Other: he has never been on insulin, but he has learned how; he stopped pioglitizone, due to edema.   Interval history: pt states he feels well in general.  no cbg record, but pt says cbg's vary from 96-190.  He sometimes misses meds.  No recent steroids. He rarely has hypoglycemia, and these episodes are mild.   Past Medical History:  Diagnosis Date   Carpal tunnel syndrome    both hands   Colon polyps    Coronary artery disease    a. Cath 11/21/2015: Multivessel CAD --> CABG recommended. b. CABG on 11/22/2015:  LIMA-LAD, SVG-Diag, SVG-OM1 and distal Cx, and SVG-PDA.   Diabetes mellitus    GERD (gastroesophageal reflux disease)    History of echocardiogram    a. Echo 4/17: Moderate LVH, EF 50-55%, mild LAE, no pericardial effusion   Hyperlipidemia    Hypertension    Pneumonia     Past Surgical History:  Procedure Laterality Date   CARDIAC CATHETERIZATION N/A 11/21/2015   Procedure: Left Heart Cath and Coronary Angiography;  Surgeon: [name], MD;  Location: [facility];  Service: Cardiovascular;  Laterality: N/A;   COLONOSCOPY  02/23/2020   per Dr. Tarri Glenn, tubular adenomas, repeat in 3 yrs    colonsocopy  12/07/2009   per Dr. [name], benign polyps, repeat in 10 yrs    CORONARY ARTERY BYPASS GRAFT N/A 11/22/2015   Procedure: CORONARY ARTERY BYPASS GRAFTING (CABG) times five using the left internal mammary, right greater saphenous vein EVH, and left thigh greater saphenous vein EVH;  Surgeon: [name], MD;  Location: [facility];  Service: Open Heart Surgery;  Laterality: N/A;   frozen shoulder release Right    INTRAVASCULAR PRESSURE  WIRE/FFR STUDY N/A 12/14/2018   Procedure: INTRAVASCULAR PRESSURE WIRE/FFR STUDY;  Surgeon: Jettie Booze, MD;  Location: [facility] [facility];  Service: Cardiovascular;  Laterality: N/A;   LEFT HEART CATH AND CORS/GRAFTS ANGIOGRAPHY N/A 10/27/2017   Procedure: LEFT HEART CATH AND CORS/GRAFTS ANGIOGRAPHY;  Surgeon: [name], MD;  Location: [place];  Service: Cardiovascular;  Laterality: N/A;   LEFT HEART CATH AND CORS/GRAFTS ANGIOGRAPHY N/A 12/14/2018   Procedure: LEFT HEART CATH AND CORS/GRAFTS ANGIOGRAPHY;  Surgeon: Jettie Booze, MD;  Location: [facility];  Service: Cardiovascular;  Laterality: N/A;   TEE WITHOUT CARDIOVERSION N/A 11/22/2015   Procedure: TRANSESOPHAGEAL ECHOCARDIOGRAM (TEE);  Surgeon: [name], MD;  Location: [place];  Service: Open Heart Surgery;  Laterality: N/A;   WRIST GANGLION EXCISION Right     Social History   Socioeconomic History   Marital status: Married    Spouse name: Not on file   Number of children: 5   Years of education: Not on file   Highest education level: Not on file  Occupational History   Occupation: Dispatcher  Tobacco Use   Smoking status: Former    Packs/Sligh: 0.25    Years: 20.00    Pack years: 5.00    Types: Cigarettes    Quit date: 10/07/2013    Years since quitting: 7.6   Smokeless tobacco:  Never   Tobacco comments:    quit cigarettes, might have a cigar 1 X month  Vaping Use   Vaping Use: Never used  Substance and Sexual Activity   Alcohol use: Yes    Alcohol/week: 3.0 - 4.0 standard drinks    Types: 3 - 4 Standard drinks or equivalent per week   Drug use: No   Sexual activity: Not Currently    Birth control/protection: None  Other Topics Concern   Not on file  Social History Narrative   Originally from [place], [place]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)   Married   5 kids   6 grand, 2 great grand   Social Determinants of Health   Financial Resource  Strain: Low Risk    Difficulty of Paying Living Expenses: Not hard at all  Food Insecurity: No Food Insecurity   Worried About Charity fundraiser in the Last Year: Never true   Arboriculturist in the Last Year: Never true  Transportation Needs: No Transportation Needs   Lack of Transportation (Medical): No   Lack of Transportation (Non-Medical): No  Physical Activity: Inactive   Days of Exercise per Week: 0 days   Minutes of Exercise per Session: 0 min  Stress: No Stress Concern Present   Feeling of Stress : Not at all  Social Connections: Socially Integrated   Frequency of Communication with Friends and Family: More than three times a week   Frequency of Social Gatherings with Friends and Family: More than three times a week   Attends Religious Services: More than 4 times per year   Active Member of [employer] or Organizations: Yes   Attends Music therapist: More than 4 times per year   Marital Status: Married  Human resources officer Violence: Not At Risk   Fear of Current or Ex-Partner: No   Emotionally Abused: No   Physically Abused: No   Sexually Abused: No    Current Outpatient Medications on File Prior to Visit  Medication Sig Dispense Refill   Accu-Chek Softclix Lancets lancets 1 each by Other route daily. Use to monitor glucose levels daily; E11.8 100 each 2   acetaminophen (TYLENOL) 500 MG tablet Take 500 mg by mouth as needed.     APPLE CIDER VINEGAR PO Take by mouth daily.      Ascorbic Acid (VITAMIN C PO) Take 1 tablet by mouth daily.     aspirin EC 81 MG tablet Take 1 tablet (81 mg total) by mouth daily.     benazepril (LOTENSIN) 20 MG tablet Take 1 tablet (20 mg total) by mouth daily. 90 tablet 1   Blood Glucose Monitoring Suppl (ACCU-CHEK AVIVA PLUS) w/Device KIT 1 each by Does not apply route daily. Use to monitor glucose levels daily; E11.8 1 kit 0   carvedilol (COREG) 3.125 MG tablet Take 1 tablet (3.125 mg total) by mouth 2 (two) times daily with a meal. 180  tablet 3   Cinnamon 500 MG capsule Take 1 capsule by mouth daily.     Coenzyme Q10 (COQ-10) 100 MG CAPS Take 1 tablet by mouth daily.     Cyanocobalamin (VITAMIN B-12 PO) Take 1 tablet by mouth daily.     ezetimibe (ZETIA) 10 MG tablet Take 1 tablet (10 mg total) by mouth daily. Pt needs to keep upcoming appt in Nov for further refills 90 tablet 0   ferrous sulfate 325 (65 FE) MG tablet Take 325 mg by mouth daily  with breakfast.     Garlic 1610 MG CAPS Take 1 tablet by mouth daily.     glucose blood (ACCU-CHEK AVIVA PLUS) test strip 1 each by Other route daily. Use to monitor glucose levels daily; E11.8 100 each 2   Multiple Vitamin (MULTIVITAMIN) tablet Take 1 tablet by mouth daily.     nitroGLYCERIN (NITROSTAT) 0.4 MG SL tablet Place 1 tablet (0.4 mg total) under the tongue every 5 (five) minutes as needed for chest pain. 75 tablet 1   Omega-3 Fatty Acids (FISH OIL PO) Take 1 capsule by mouth daily.     pantoprazole (PROTONIX) 40 MG tablet Take 1 tablet (40 mg total) by mouth 2 (two) times daily. 180 tablet 3   ranolazine (RANEXA) 500 MG 12 hr tablet Take 1 tablet (500 mg total) by mouth 2 (two) times daily. Patient needs to call and schedule a one year follow up appointment for further refills 1st attempt 60 tablet 4   repaglinide (PRANDIN) 0.5 MG tablet Take 1 tablet (0.5 mg total) by mouth 2 (two) times daily before a meal. 180 tablet 3   sildenafil (VIAGRA) 100 MG tablet TAKE ONE TABLET BY MOUTH DAILY AS NEEDED FOR ERECTILE DYSFUNCTION 30 tablet 5   VITAMIN E PO Take 1 capsule by mouth daily.     albuterol (VENTOLIN HFA) 108 (90 Base) MCG/ACT inhaler Inhale 2 puffs into the lungs every 6 (six) hours as needed for wheezing or shortness of breath. 18 g 0   No current facility-administered medications on file prior to visit.    No Known Allergies  Family History  Problem Relation Age of Onset   Heart failure Father    Anuerysm Brother        AAA   Hyperlipidemia Sister    Diabetes  Sister    Hypertension Sister    Kidney failure Sister    Diabetes Maternal Grandmother    Hyperlipidemia Sister    Diabetes Sister    Colon polyps Sister    Kidney disease Sister    Other Brother        COVID 19   Diabetes Son    Heart disease Son    Heart attack Neg Hx    Colon cancer Neg Hx    Stomach cancer Neg Hx    Esophageal cancer Neg Hx     BP (!) 160/96 (BP Location: Right Arm, Patient Position: Sitting, Cuff Size: Large)   Pulse 71   Ht _0  (1.702 m)   Wt 201 lb 6.4 oz (91.4 kg)   SpO2 96%   BMI 31.54 kg/m    Review of Systems He denies hypoglycemia.      Objective:   Physical Exam Pulses: dorsalis pedis intact bilat.   MSK: no deformity of the feet CV: 1+ bilat leg edema Skin:  no ulcer on the feet.  normal color and temp on the feet. Neuro: sensation is intact to touch on the feet.  Ext: there is bilateral onychomycosis of the toenails.     Lab Results  Component Value Date   HGBA1C 6.9 (A) 06/14/2021      Assessment & Plan:  Type 2 DM Hypoglycemia, due to repaglinide: this limits aggressiveness of glycemic control  Patient Instructions  check your blood sugar once a Handrich.  vary the time of Facey when you check, between before the 3 meals, and at bedtime.  also check if you have symptoms of your blood sugar being too high or too low.  please keep a record of the readings and bring it to your next appointment here (or you can bring the meter itself).  You can write it on any piece of paper.  please call us sooner if your blood sugar goes below 70, or if you have a lot of readings over 200. Please continue the same 3 diabetes medications.  Please come back for a follow-up appointment in 4 months.

## 2021-06-14 NOTE — Patient Instructions (Signed)
check your blood sugar once a [place].  vary the time of [place] when you check, between before the 3 meals, and at bedtime.  also check if you have symptoms of your blood sugar being too high or too low.  please keep a record of the readings and bring it to your next appointment here (or you can bring the meter itself).  You can write it on any piece of paper.  please call us sooner if your blood sugar goes below 70, or if you have a lot of readings over 200. Please continue the same 3 diabetes medications.  Please come back for a follow-up appointment in 4 months.

## 2021-06-14 NOTE — Telephone Encounter (Signed)
RX sent

## 2021-06-18 ENCOUNTER — Other Ambulatory Visit: Payer: Self-pay | Admitting: Gastroenterology

## 2021-06-18 ENCOUNTER — Other Ambulatory Visit: Payer: Self-pay | Admitting: Interventional Cardiology

## 2021-06-18 ENCOUNTER — Encounter: Payer: Self-pay | Admitting: Podiatry

## 2021-06-18 DIAGNOSIS — K219 Gastro-esophageal reflux disease without esophagitis: Secondary | ICD-10-CM

## 2021-06-18 NOTE — Progress Notes (Signed)
  Subjective:  Patient ID: Jerome Vaughn, male    DOB: 1951/01/31,  MRN: [id number]  Chief Complaint  Patient presents with   Nail Problem    Nail trim    70 y.o. male returns for the above complaint.  Patient presents with complaint of thickened elongated dystrophic toenails x10.  They are painful to touch.  Patient is a diabetic with last A1c of 9.8.  He is not able to do it himself.  He would like for me to debride them now.  He denies any other acute complaints.  Objective:  There were no vitals filed for this visit. Podiatric Exam: Vascular: dorsalis pedis and posterior tibial pulses are palpable bilateral. Capillary return is immediate. Temperature gradient is WNL. Skin turgor WNL  Sensorium: Decreased Semmes Weinstein monofilament test.  Decreased tactile sensation bilaterally. Nail Exam: Pt has thick disfigured discolored nails with subungual debris noted bilateral entire nail hallux through fifth toenails.  Pain on palpation to the nails. Ulcer Exam: There is no evidence of ulcer or pre-ulcerative changes or infection. Orthopedic Exam: Muscle tone and strength are WNL. No limitations in general ROM. No crepitus or effusions noted. HAV  B/L.  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 Skin: No Porokeratosis. No infection or ulcers    Assessment & Plan:   1. Type 2 diabetes mellitus with complication, without long-term current use of insulin (HCC)   2. Pain due to onychomycosis of toenails of both feet      Patient was evaluated and treated and all questions answered.  Onychomycosis with pain  -Nails palliatively debrided as below. -Educated on self-care  Procedure: Nail Debridement Rationale: pain  Type of Debridement: manual, sharp debridement. Instrumentation: Nail nipper, rotary burr. Number of Nails: 10  Procedures and Treatment: Consent by patient was obtained for treatment procedures. The patient understood the discussion of treatment and procedures well. All questions were answered  thoroughly reviewed. Debridement of mycotic and hypertrophic toenails, 1 through 5 bilateral and clearing of subungual debris. No ulceration, no infection noted.  Return Visit-Office Procedure: Patient instructed to return to the office for a follow up visit 3 months for continued evaluation and treatment.  [name], DPM    No follow-ups on file.

## 2021-06-20 ENCOUNTER — Other Ambulatory Visit: Payer: Self-pay | Admitting: Endocrinology

## 2021-06-20 ENCOUNTER — Other Ambulatory Visit: Payer: Self-pay | Admitting: Interventional Cardiology

## 2021-06-20 ENCOUNTER — Other Ambulatory Visit: Payer: Self-pay | Admitting: Family Medicine

## 2021-06-20 DIAGNOSIS — E118 Type 2 diabetes mellitus with unspecified complications: Secondary | ICD-10-CM

## 2021-06-24 ENCOUNTER — Telehealth: Payer: Self-pay | Admitting: Pharmacist

## 2021-06-24 NOTE — Chronic Care Management (AMB) (Signed)
Chronic Care Management Pharmacy Assistant   Name: Jerome Vaughn  MRN: [id number] DOB: 1951-09-29   Reason for Encounter: Disease State / Diabetes Assessment Call   Conditions to be addressed/monitored: DMII  Recent office visits:  None  Recent consult visits:  06/14/2021 [name] MD (Podiatry) - Patient was seen for Type 2 diabetes and an additional issue. No medication changes. Follow up in 3 months.  06/14/2021 [name] MD (Endocrinology) - Patient was seen for Type 2 diabetes. No medication changes. Follow up in 4 months.   Hospital visits:  None in previous 6 months  Medications: Outpatient Encounter Medications as of 06/24/2021  Medication Sig   Accu-Chek Softclix Lancets lancets 1 each by Other route daily. Use to monitor glucose levels daily; E11.8   acetaminophen (TYLENOL) 500 MG tablet Take 500 mg by mouth as needed.   albuterol (VENTOLIN HFA) 108 (90 Base) MCG/ACT inhaler Inhale 2 puffs into the lungs every 6 (six) hours as needed for wheezing or shortness of breath.   APPLE CIDER VINEGAR PO Take by mouth daily.    Ascorbic Acid (VITAMIN C PO) Take 1 tablet by mouth daily.   aspirin EC 81 MG tablet Take 1 tablet (81 mg total) by mouth daily.   benazepril (LOTENSIN) 20 MG tablet Take 1 tablet (20 mg total) by mouth daily.   Blood Glucose Monitoring Suppl (ACCU-CHEK AVIVA PLUS) w/Device KIT 1 each by Does not apply route daily. Use to monitor glucose levels daily; E11.8   bromocriptine (PARLODEL) 2.5 MG tablet Take 1 tablet (2.5 mg total) by mouth daily.   carvedilol (COREG) 3.125 MG tablet Take 1 tablet (3.125 mg total) by mouth 2 (two) times daily with a meal.   Cinnamon 500 MG capsule Take 1 capsule by mouth daily.   Coenzyme Q10 (COQ-10) 100 MG CAPS Take 1 tablet by mouth daily.   Cyanocobalamin (VITAMIN B-12 PO) Take 1 tablet by mouth daily.   ezetimibe (ZETIA) 10 MG tablet TAKE ONE TABLET BY MOUTH DAILY   ferrous sulfate 325 (65 FE) MG tablet Take 325 mg  by mouth daily with breakfast.   Garlic 0165 MG CAPS Take 1 tablet by mouth daily.   glucose blood (ACCU-CHEK AVIVA PLUS) test strip 1 each by Other route daily. Use to monitor glucose levels daily; E11.8   metFORMIN (GLUCOPHAGE-XR) 500 MG 24 hr tablet Take 4 tablets (2,000 mg total) by mouth daily.   Multiple Vitamin (MULTIVITAMIN) tablet Take 1 tablet by mouth daily.   nitroGLYCERIN (NITROSTAT) 0.4 MG SL tablet Place 1 tablet (0.4 mg total) under the tongue every 5 (five) minutes as needed for chest pain.   Omega-3 Fatty Acids (FISH OIL PO) Take 1 capsule by mouth daily.   pantoprazole (PROTONIX) 40 MG tablet Take 1 tablet (40 mg total) by mouth 2 (two) times daily.   ranolazine (RANEXA) 500 MG 12 hr tablet Take 1 tablet (500 mg total) by mouth 2 (two) times daily. Patient needs to call and schedule a one year follow up appointment for further refills 1st attempt   repaglinide (PRANDIN) 0.5 MG tablet Take 1 tablet (0.5 mg total) by mouth 2 (two) times daily before a meal.   rosuvastatin (CRESTOR) 40 MG tablet Take 1 tablet (40 mg total) by mouth daily.   sildenafil (VIAGRA) 100 MG tablet TAKE ONE TABLET BY MOUTH DAILY AS NEEDED FOR ERECTILE DYSFUNCTION   VITAMIN E PO Take 1 capsule by mouth daily.   No facility-administered encounter medications on  file as of 06/24/2021.   Fill History:  albuterol (PROVENTIL HFA;VENTOLIN HFA) inhaler 11/30/2020 25   benazepril 20 mg tablet 10/11/2020 90   carvedilol 3.125 mg tablet 10/11/2020 90   ezetimibe (ZETIA) tablet 10 mg 03/20/2021 90   pantoprazole (PROTONIX) EC tablet 03/20/2021 90   ranolazine (RANEXA) 12 hr tablet 04/24/2021 90   repaglinide (PRANDIN) tablet 03/31/2021 90   metformin 500 mg tablet 10/13/2020 90   ROSUVASTATIN 40MG TAB 07/27/2020 90   Recent Relevant Labs: Lab Results  Component Value Date/Time   HGBA1C 6.9 (A) 06/14/2021 08:21 AM   HGBA1C 9.8 (A) 12/28/2020 10:43 AM   HGBA1C 7.6 (H) 11/21/2015 06:20 PM   HGBA1C  7.1 (H) 02/19/2015 02:35 PM   MICROALBUR 1.2 08/22/2014 10:34 AM   MICROALBUR 1.6 03/28/2011 11:41 AM    Kidney Function Lab Results  Component Value Date/Time   CREATININE 1.32 (H) 04/10/2021 10:48 AM   CREATININE 1.41 (H) 09/26/2020 03:11 PM   CREATININE 1.19 11/14/2015 11:35 AM   GFR 81.97 04/11/2019 09:24 AM   GFRNONAA 59 (L) 04/10/2021 10:48 AM   GFRAA >60 06/14/2020 02:19 PM    Current antihyperglycemic regimen:  Metformin ER 500 mg - take 4 tablets once daily Repaglinide 0.5 mg - take 1 tablet twice daily  Have there been any recent hospitalizations or ED visits since last visit with CPP? No  Adherence Review: Is the patient currently on a STATIN medication? Yes Is the patient currently on ACE/ARB medication? Yes Does the patient have >5 [place] gap between last estimated fill dates? Yes  Notes: 3 attempts to contact patient, no closed  Care Gaps:  AWV - message sent to Ramond Craver to schedule Covid 19 vaccine booster 3 - overdue Tetanus/TDAP - overdue  Flu vaccine - due HGA1C - 6.9 on 06/14/2021  Star Rating Drugs:  Benazepril 71m - [facility]formin 500 mg - [facility] J[facility] HighlandRepaglinide 0.5 mg - last filled 03/31/2021 [facility]2020 [facility] [phone]

## 2021-07-17 ENCOUNTER — Telehealth: Payer: Self-pay | Admitting: Pharmacist

## 2021-07-17 NOTE — Progress Notes (Signed)
Chronic Care Management Pharmacy Assistant   Name: Jerome Vaughn  MRN: [id number] DOB: 1951-06-13  Reason for Encounter: Disease State / Diabetes Assessment Call   Conditions to be addressed/monitored: DMII  Recent office visits:  None  Recent consult visits:  None  Hospital visits:  None in previous 6 months  Medications: Outpatient Encounter Medications as of 07/17/2021  Medication Sig   Accu-Chek Softclix Lancets lancets 1 each by Other route daily. Use to monitor glucose levels daily; E11.8   acetaminophen (TYLENOL) 500 MG tablet Take 500 mg by mouth as needed.   albuterol (VENTOLIN HFA) 108 (90 Base) MCG/ACT inhaler Inhale 2 puffs into the lungs every 6 (six) hours as needed for wheezing or shortness of breath.   APPLE CIDER VINEGAR PO Take by mouth daily.    Ascorbic Acid (VITAMIN C PO) Take 1 tablet by mouth daily.   aspirin EC 81 MG tablet Take 1 tablet (81 mg total) by mouth daily.   benazepril (LOTENSIN) 20 MG tablet Take 1 tablet (20 mg total) by mouth daily.   Blood Glucose Monitoring Suppl (ACCU-CHEK AVIVA PLUS) w/Device KIT 1 each by Does not apply route daily. Use to monitor glucose levels daily; E11.8   bromocriptine (PARLODEL) 2.5 MG tablet Take 1 tablet (2.5 mg total) by mouth daily.   carvedilol (COREG) 3.125 MG tablet Take 1 tablet (3.125 mg total) by mouth 2 (two) times daily with a meal.   Cinnamon 500 MG capsule Take 1 capsule by mouth daily.   Coenzyme Q10 (COQ-10) 100 MG CAPS Take 1 tablet by mouth daily.   Cyanocobalamin (VITAMIN B-12 PO) Take 1 tablet by mouth daily.   ezetimibe (ZETIA) 10 MG tablet TAKE ONE TABLET BY MOUTH DAILY   ferrous sulfate 325 (65 FE) MG tablet Take 325 mg by mouth daily with breakfast.   Garlic 7510 MG CAPS Take 1 tablet by mouth daily.   glucose blood (ACCU-CHEK AVIVA PLUS) test strip 1 each by Other route daily. Use to monitor glucose levels daily; E11.8   metFORMIN (GLUCOPHAGE-XR) 500 MG 24 hr tablet Take 4 tablets  (2,000 mg total) by mouth daily.   Multiple Vitamin (MULTIVITAMIN) tablet Take 1 tablet by mouth daily.   nitroGLYCERIN (NITROSTAT) 0.4 MG SL tablet Place 1 tablet (0.4 mg total) under the tongue every 5 (five) minutes as needed for chest pain.   Omega-3 Fatty Acids (FISH OIL PO) Take 1 capsule by mouth daily.   pantoprazole (PROTONIX) 40 MG tablet Take 1 tablet (40 mg total) by mouth 2 (two) times daily.   ranolazine (RANEXA) 500 MG 12 hr tablet Take 1 tablet (500 mg total) by mouth 2 (two) times daily. Patient needs to call and schedule a one year follow up appointment for further refills 1st attempt   repaglinide (PRANDIN) 0.5 MG tablet Take 1 tablet (0.5 mg total) by mouth 2 (two) times daily before a meal.   rosuvastatin (CRESTOR) 40 MG tablet Take 1 tablet (40 mg total) by mouth daily.   sildenafil (VIAGRA) 100 MG tablet TAKE ONE TABLET BY MOUTH DAILY AS NEEDED FOR ERECTILE DYSFUNCTION   VITAMIN E PO Take 1 capsule by mouth daily.   No facility-administered encounter medications on file as of 07/17/2021.   Fill History: albuterol (PROVENTIL HFA;VENTOLIN HFA) inhaler 11/30/2020 25   benazepril (LOTENSIN) tablet 03/19/2021 90   bromocriptine (PARLODEL) tablet 2.5 mg 06/14/2021 90   carvedilol (COREG) tablet 05/27/2021 90   ezetimibe (ZETIA) tablet 10 mg 06/04/2021 90  pantoprazole (PROTONIX) EC tablet 05/31/2021 90   ranolazine (RANEXA) 12 hr tablet 04/24/2021 90   repaglinide (PRANDIN) tablet 03/31/2021 90   ROSUVASTATIN 40MG TAB 07/27/2020 90   metFORMIN (GLUCOPHAGE-XR) 24 hr tablet 06/14/2021 90   Recent Relevant Labs: Lab Results  Component Value Date/Time   HGBA1C 6.9 (A) 06/14/2021 08:21 AM   HGBA1C 9.8 (A) 12/28/2020 10:43 AM   HGBA1C 7.6 (H) 11/21/2015 06:20 PM   HGBA1C 7.1 (H) 02/19/2015 02:35 PM   MICROALBUR 1.2 08/22/2014 10:34 AM   MICROALBUR 1.6 03/28/2011 11:41 AM    Kidney Function Lab Results  Component Value Date/Time   CREATININE 1.32 (H)  04/10/2021 10:48 AM   CREATININE 1.41 (H) 09/26/2020 03:11 PM   CREATININE 1.19 11/14/2015 11:35 AM   GFR 81.97 04/11/2019 09:24 AM   GFRNONAA 59 (L) 04/10/2021 10:48 AM   GFRAA >60 06/14/2020 02:19 PM    Current antihyperglycemic regimen:  Metformin ER 500 mg - take 4 tablets once daily Repaglinide 0.5 mg - take 1 tablet twice daily  What recent interventions/DTPs have been made to improve glycemic control:  No  Have there been any recent hospitalizations or ED visits since last visit with CPP? No  Patient reports hypoglycemic symptoms, including dizziness, shaky feeling, nausea and weak feeling  Patient denies hyperglycemic symptoms, including none  How often are you checking your blood sugar? twice daily  What are your blood sugars ranging?  Fasting: 95-100 After meals: around 130  During the week, how often does your blood glucose drop below 70? Never  Are you checking your feet daily/regularly? Yes, Patient is also going to [place] every 3 months   Adherence Review: Is the patient currently on a STATIN medication? Yes Is the patient currently on ACE/ARB medication? Yes Does the patient have >5 [place] gap between last estimated fill dates? Yes   Care Gaps: AWV - message sent to Ramond Craver to schedule Covid 19 vaccine booster 3 - overdue Tetanus/TDAP - overdue  Flu vaccine - due HGA1C - 6.9 on 06/14/2021  Star Rating Drugs: Benazepril 62m - [facility]landMetformin 500 mg - [facility] HighlandRepaglinide 0.5 mg - last filled 03/31/2021 [facility] 461m- last [facility] 07/27/2020 [facility] [place] [facility] [phone]

## 2021-07-31 ENCOUNTER — Telehealth: Payer: Self-pay

## 2021-07-31 ENCOUNTER — Other Ambulatory Visit: Payer: Self-pay | Admitting: Interventional Cardiology

## 2021-07-31 NOTE — Telephone Encounter (Signed)
**Note De-Identified Jazmyne Beauchesne Obfuscation** Ranolazine PA done through covermymeds and the following message was received: Hendricks Milo Colglazier Key: [id number] Outcome: Available without authorization. Drug: Ranolazine ER 500MG  er tablets Form: [employer] Electronic PA Form  I have notified [employer] of this outcome.

## 2021-08-04 NOTE — Progress Notes (Signed)
Cardiology Office Note:    Date:  08/05/2021   ID:  Jerome Vaughn, DOB 12-Nov-1950, MRN [id number]  PCP:  [name], MD  Cardiologist:  Sinclair Grooms, MD   Referring MD: [name], MD   Chief Complaint  Patient presents with   Coronary Artery Disease   Congestive Heart Failure   Hypertension    History of Present Illness:    Jerome Vaughn is a 70 y.o. 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 He was referred to [facility]. He was seen by Dr. Prescott Gum and underwent CABG with LIMA-LAD, SVG-diagonal, SVG-OM1/distal LCx, SVG-PDA on 11/22/15.  After bypass surgery he was readmitted to the hospital after developing chest discomfort.  Recently an LV assessment by echo demonstrated EF 30-35%.   Starting approximately 6 months after surgery he has begun complaining of episodes of chest pain.   He is doing well.  He works full-time as a Freight forwarder and does a lot of walking at his job.  He has occasional fluttering in his chest that occurs randomly but not with activity.  He says he will occasionally grab his chest when he feels this and this causes his wife to have some stress.  He is not able to do running or heavy continuous walking because of bilateral knee discomfort.  States he occasionally has "GERD" which is a feeling of tightness that can last several minutes before resolving.  These episodes occur at rest as well.  He has not used nitroglycerin.  He did bowling with his son over the weekend for 2 hours without any physical disability or ailment.  Past Medical History:  Diagnosis Date   Carpal tunnel syndrome    both hands   Colon polyps    Coronary artery disease    a. Cath 11/21/2015: Multivessel CAD --> CABG recommended. b. CABG on 11/22/2015:  LIMA-LAD, SVG-Diag, SVG-OM1 and distal Cx, and SVG-PDA.   Diabetes mellitus    GERD (gastroesophageal reflux disease)    History of echocardiogram    a. Echo 4/17: Moderate LVH, EF 50-55%, mild LAE, no  pericardial effusion   Hyperlipidemia    Hypertension    Pneumonia     Past Surgical History:  Procedure Laterality Date   CARDIAC CATHETERIZATION N/A 11/21/2015   Procedure: Left Heart Cath and Coronary Angiography;  Surgeon: [name], MD;  Location: [facility];  Service: Cardiovascular;  Laterality: N/A;   COLONOSCOPY  02/23/2020   per Dr. Tarri Glenn, tubular adenomas, repeat in 3 yrs    colonsocopy  12/07/2009   per Dr. [name], benign polyps, repeat in 10 yrs    CORONARY ARTERY BYPASS GRAFT N/A 11/22/2015   Procedure: CORONARY ARTERY BYPASS GRAFTING (CABG) times five using the left internal mammary, right greater saphenous vein EVH, and left thigh greater saphenous vein EVH;  Surgeon: [name], MD;  Location: [facility];  Service: Open Heart Surgery;  Laterality: N/A;   frozen shoulder release Right    INTRAVASCULAR PRESSURE WIRE/FFR STUDY N/A 12/14/2018   Procedure: INTRAVASCULAR PRESSURE WIRE/FFR STUDY;  Surgeon: Jettie Booze, MD;  Location: [facility];  Service: Cardiovascular;  Laterality: N/A;   LEFT HEART CATH AND CORS/GRAFTS ANGIOGRAPHY N/A 10/27/2017   Procedure: LEFT HEART CATH AND CORS/GRAFTS ANGIOGRAPHY;  Surgeon: [name], MD;  Location: [facility];  Service: Cardiovascular;  Laterality: N/A;   LEFT HEART CATH AND CORS/GRAFTS ANGIOGRAPHY N/A 12/14/2018  Procedure: LEFT HEART CATH AND CORS/GRAFTS ANGIOGRAPHY;  Surgeon: Jettie Booze, MD;  Location: [facility];  Service: Cardiovascular;  Laterality: N/A;   TEE WITHOUT CARDIOVERSION N/A 11/22/2015   Procedure: TRANSESOPHAGEAL ECHOCARDIOGRAM (TEE);  Surgeon: [name], MD;  Location: [facility];  Service: Open Heart Surgery;  Laterality: N/A;   WRIST GANGLION EXCISION Right     Current Medications: Current Meds  Medication Sig   Accu-Chek Softclix Lancets lancets 1 each by Other route daily. Use to monitor glucose levels daily; E11.8   acetaminophen (TYLENOL) 500 MG  tablet Take 500 mg by mouth as needed.   APPLE CIDER VINEGAR PO Take by mouth daily.    Ascorbic Acid (VITAMIN C PO) Take 1 tablet by mouth daily.   aspirin EC 81 MG tablet Take 1 tablet (81 mg total) by mouth daily.   benazepril (LOTENSIN) 20 MG tablet Take 1 tablet (20 mg total) by mouth daily.   Blood Glucose Monitoring Suppl (ACCU-CHEK AVIVA PLUS) w/Device KIT 1 each by Does not apply route daily. Use to monitor glucose levels daily; E11.8   bromocriptine (PARLODEL) 2.5 MG tablet Take 1 tablet (2.5 mg total) by mouth daily.   carvedilol (COREG) 3.125 MG tablet Take 1 tablet (3.125 mg total) by mouth 2 (two) times daily with a meal.   Cinnamon 500 MG capsule Take 1 capsule by mouth daily.   Coenzyme Q10 (COQ-10) 100 MG CAPS Take 1 tablet by mouth daily.   Cyanocobalamin (VITAMIN B-12 PO) Take 1 tablet by mouth daily.   ezetimibe (ZETIA) 10 MG tablet TAKE ONE TABLET BY MOUTH DAILY   ferrous sulfate 325 (65 FE) MG tablet Take 325 mg by mouth daily with breakfast.   Garlic 5093 MG CAPS Take 1 tablet by mouth daily.   glucose blood (ACCU-CHEK AVIVA PLUS) test strip 1 each by Other route daily. Use to monitor glucose levels daily; E11.8   metFORMIN (GLUCOPHAGE-XR) 500 MG 24 hr tablet Take 4 tablets (2,000 mg total) by mouth daily.   Multiple Vitamin (MULTIVITAMIN) tablet Take 1 tablet by mouth daily.   nitroGLYCERIN (NITROSTAT) 0.4 MG SL tablet Place 1 tablet (0.4 mg total) under the tongue every 5 (five) minutes as needed for chest pain.   Omega-3 Fatty Acids (FISH OIL PO) Take 1 capsule by mouth daily.   pantoprazole (PROTONIX) 40 MG tablet Take 1 tablet (40 mg total) by mouth 2 (two) times daily.   ranolazine (RANEXA) 500 MG 12 hr tablet Take 1 tablet (500 mg total) by mouth 2 (two) times daily.   repaglinide (PRANDIN) 0.5 MG tablet Take 1 tablet (0.5 mg total) by mouth 2 (two) times daily before a meal.   rosuvastatin (CRESTOR) 40 MG tablet Take 1 tablet (40 mg total) by mouth daily.    sildenafil (VIAGRA) 100 MG tablet TAKE ONE TABLET BY MOUTH DAILY AS NEEDED FOR ERECTILE DYSFUNCTION   VITAMIN E PO Take 1 capsule by mouth daily.     Allergies:   Patient has no known allergies.   Social History   Socioeconomic History   Marital status: Married    Spouse name: Not on file   Number of children: 5   Years of education: Not on file   Highest education level: Not on file  Occupational History   Occupation: Dispatcher  Tobacco Use   Smoking status: Former    Packs/[place]: 0.25    Years: 20.00    Pack years: 5.00    Types: Cigarettes  Quit date: 10/07/2013    Years since quitting: 7.8   Smokeless tobacco: Never   Tobacco comments:    quit cigarettes, might have a cigar 1 X month  Vaping Use   Vaping Use: Never used  Substance and Sexual Activity   Alcohol use: Yes    Alcohol/week: 3.0 - 4.0 standard drinks    Types: 3 - 4 Standard drinks or equivalent per week   Drug use: No   Sexual activity: Not Currently    Birth control/protection: None  Other Topics Concern   Not on file  Social History Narrative   Originally from [place], [place]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)   Married   5 kids   6 grand, 2 great grand   Social Determinants of Health   Financial Resource Strain: Low Risk    Difficulty of Paying Living Expenses: Not hard at all  Food Insecurity: No Food Insecurity   Worried About Charity fundraiser in the Last Year: Never true   Arboriculturist in the Last Year: Never true  Transportation Needs: No Transportation Needs   Lack of Transportation (Medical): No   Lack of Transportation (Non-Medical): No  Physical Activity: Inactive   Days of Exercise per Week: 0 days   Minutes of Exercise per Session: 0 min  Stress: No Stress Concern Present   Feeling of Stress : Not at all  Social Connections: Socially Integrated   Frequency of Communication with Friends and Family: More than three times a week   Frequency of  Social Gatherings with Friends and Family: More than three times a week   Attends Religious Services: More than 4 times per year   Active Member of [employer] or Organizations: Yes   Attends Music therapist: More than 4 times per year   Marital Status: Married     Family History: The patient's family history includes Anuerysm in his brother; Colon polyps in his sister; Diabetes in his maternal grandmother, sister, sister, and son; Heart disease in his son; Heart failure in his father; Hyperlipidemia in his sister and sister; Hypertension in his sister; Kidney disease in his sister; Kidney failure in his sister; Other in his brother. There is no history of Heart attack, Colon cancer, Stomach cancer, or Esophageal cancer.  ROS:   Please see the history of present illness.    Sleeps well.  No edema.  Has bilateral knee pain that interferes with walking.  All other systems reviewed and are negative.  EKGs/Labs/Other Studies Reviewed:    The following studies were reviewed today:  Cardiac Cath 12/14/2018:  2D Doppler echocardiogram 2020: IMPRESSIONS     1. The left ventricle has mildly reduced systolic function, with an  ejection fraction of 45-50%. The cavity size was normal. Left ventricular  diastolic Doppler parameters are indeterminate. There is abnormal septal  motion consistent with post-operative  status. Left ventricular diffuse hypokinesis.   2. The right ventricle has normal systolic function. The cavity was  normal. There is no increase in right ventricular wall thickness.   3. Left atrial size was mildly dilated.   4. There is mild mitral annular calcification present.   5. The aortic valve is tricuspid Mild thickening of the aortic valve Mild  calcification of the aortic valve.    EKG:  EKG normal sinus rhythm with PR interval 192 ms, left axis deviation, poor R wave progression, and when compared to the prior tracing from January 2021,  no significant changes  noted.    Recent Labs: 08/07/2020: TSH 0.55 04/10/2021: ALT 17; BUN 16; Creatinine 1.32; Hemoglobin 13.2; Platelet Count 152; Potassium 4.5; Sodium 138  Recent Lipid Panel    Component Value Date/Time   CHOL 133 08/07/2020 0906   CHOL 142 10/26/2019 0903   TRIG 101 08/07/2020 0906   HDL 59 08/07/2020 0906   HDL 74 10/26/2019 0903   CHOLHDL 2.3 08/07/2020 0906   VLDL 28.8 04/11/2019 0924   LDLCALC 56 08/07/2020 0906   LDLDIRECT 204.8 09/05/2013 1559    Physical Exam:    VS:  BP 132/78   Pulse 71   Ht _0  (1.702 m)   Wt 200 lb (90.7 kg)   SpO2 98%   BMI 31.32 kg/m     Wt Readings from Last 3 Encounters:  08/05/21 200 lb (90.7 kg)  06/14/21 201 lb 6.4 oz (91.4 kg)  12/28/20 190 lb 6.4 oz (86.4 kg)     GEN: Overweight. No acute distress HEENT: Normal NECK: No JVD. LYMPHATICS: No lymphadenopathy CARDIAC: No  murmur. RRR no gallop, or edema. VASCULAR:  Normal Pulses. No bruits. RESPIRATORY:  Clear to auscultation without rales, wheezing or rhonchi  ABDOMEN: Soft, non-tender, non-distended, No pulsatile mass, MUSCULOSKELETAL: No deformity  SKIN: Warm and dry NEUROLOGIC:  Alert and oriented x 3 PSYCHIATRIC:  Normal affect   ASSESSMENT:    1. Coronary artery disease involving native coronary artery of native heart without angina pectoris   2. Type 2 diabetes mellitus with complication, without long-term current use of insulin (HCC)   3. Pure hypercholesterolemia   4. Chronic combined systolic and diastolic HF (heart failure) ([place])   5. Essential hypertension    PLAN:    In order of problems listed above:  Gradually increase physical activity notifying us of any discomfort in the chest induced by activity.  Secondary prevention reviewed.  Goal is to achieve 150 minutes of moderate activity each week.  Continue ranolazine. Hemoglobin A1c 6.9%.  Consider SGLT2 therapy.  Consider discontinuing Prandin. Encouraged patient to continue Zetia 10 mg/Lame and rosuvastatin  40 mg/Geiselman.  Most recent LDL was 56 in November 2021.  Lipids will be done later this year by primary care. Most recent EF from 2 years ago as noted above.  It was near normal.  This is another reason for consideration of SGLT2 rather than Prandin therapy.  This would be an even greater indication if he ever develops dyspnea on exertion that causes any limitation. Blood pressure is under great control on carvedilol 3.125 mg twice daily, Lotensin 20 mg a Wellman.   Overall education and awareness concerning secondary risk prevention was discussed in detail: LDL less than 70, hemoglobin A1c less than 7, blood pressure target less than 130/80 mmHg, >150 minutes of moderate aerobic activity per week, avoidance of smoking, weight control (via diet and exercise), and continued surveillance/management of/for obstructive sleep apnea.    Medication Adjustments/Labs and Tests Ordered: Current medicines are reviewed at length with the patient today.  Concerns regarding medicines are outlined above.  Orders Placed This Encounter  Procedures   EKG 12-Lead   No orders of the defined types were placed in this encounter.   Patient Instructions  Medication Instructions:  Your physician recommends that you continue on your current medications as directed. Please refer to the Current Medication list given to you today.  *If you need a refill on your cardiac medications before your next appointment, please call your pharmacy*  Lab Work: None If you have labs (blood work) drawn today and your tests are completely normal, you will receive your results only by: [place] (if you have MyChart) OR A paper copy in the mail If you have any lab test that is abnormal or we need to change your treatment, we will call you to review the results.   Testing/Procedures: None   Follow-Up: At [facility], you and your health needs are our priority.  As part of our continuing mission to provide you with  exceptional heart care, we have created designated Provider Care Teams.  These Care Teams include your primary Cardiologist (physician) and Advanced Practice Providers (APPs -  Physician Assistants and Nurse Practitioners) who all work together to provide you with the care you need, when you need it.  We recommend signing up for the patient portal called "MyChart".  Sign up information is provided on this After Visit Summary.  MyChart is used to connect with patients for Virtual Visits (Telemedicine).  Patients are able to view lab/test results, encounter notes, upcoming appointments, etc.  Non-urgent messages can be sent to your provider as well.   To learn more about what you can do with MyChart, go to [email or web address].    Your next appointment:   1 year(s)  The format for your next appointment:   In Person  Provider:   Sinclair Grooms, MD     Other Instructions     Signed, Sinclair Grooms, MD  08/05/2021 4:49 PM    [place]

## 2021-08-05 ENCOUNTER — Encounter: Payer: Self-pay | Admitting: Interventional Cardiology

## 2021-08-05 ENCOUNTER — Ambulatory Visit: Payer: Medicare HMO | Admitting: Interventional Cardiology

## 2021-08-05 ENCOUNTER — Other Ambulatory Visit: Payer: Self-pay

## 2021-08-05 VITALS — BP 132/78 | HR 71 | Ht 67.0 in | Wt 200.0 lb

## 2021-08-05 DIAGNOSIS — E118 Type 2 diabetes mellitus with unspecified complications: Secondary | ICD-10-CM | POA: Diagnosis not present

## 2021-08-05 DIAGNOSIS — I5042 Chronic combined systolic (congestive) and diastolic (congestive) heart failure: Secondary | ICD-10-CM | POA: Diagnosis not present

## 2021-08-05 DIAGNOSIS — I2510 Atherosclerotic heart disease of native coronary artery without angina pectoris: Secondary | ICD-10-CM | POA: Diagnosis not present

## 2021-08-05 DIAGNOSIS — I10 Essential (primary) hypertension: Secondary | ICD-10-CM | POA: Diagnosis not present

## 2021-08-05 DIAGNOSIS — E7800 Pure hypercholesterolemia, unspecified: Secondary | ICD-10-CM

## 2021-08-05 NOTE — Patient Instructions (Signed)

## 2021-08-08 ENCOUNTER — Other Ambulatory Visit: Payer: Self-pay | Admitting: Family Medicine

## 2021-08-12 ENCOUNTER — Other Ambulatory Visit (HOSPITAL_COMMUNITY): Payer: Self-pay

## 2021-08-12 ENCOUNTER — Encounter: Payer: Self-pay | Admitting: Oncology

## 2021-08-12 ENCOUNTER — Telehealth: Payer: Self-pay

## 2021-08-12 ENCOUNTER — Telehealth: Payer: Self-pay | Admitting: Pharmacy Technician

## 2021-08-12 NOTE — Telephone Encounter (Signed)
Patient Advocate Encounter  Received faxes from [facility] (pts ins)    Per Test Claim: PA isn't needed. Repaglinide is $11.10 for 60 tabs and Bromocriptine is $87.87 for 30 tabs at our pharmacies. (Prices could vary based on brand ordered. Bromocriptine isn't currently in stock at the pharmacy I tested at.)  I also received a denial letter since they didn't receive a response to their fax this morning. We just got the initial fax today, but they wanted a response by 10:56AM  It looks like these may have been tier exception forms.

## 2021-08-12 NOTE — Telephone Encounter (Signed)
**Note De-Identified Hedy Garro Obfuscation** I have started a Ranolazine tier exception through covermymeds. Key: [id number]

## 2021-08-12 NOTE — Telephone Encounter (Signed)
Patient is due for yearly physical.   Last physical 08/02/20.   Lvm for patient to call back to schedule, then will send refill to pharmacy.

## 2021-08-12 NOTE — Telephone Encounter (Signed)
Patient called stating that his rx refill was denied and patient would like a call back  sildenafil (VIAGRA) 100 MG tablet

## 2021-08-13 NOTE — Telephone Encounter (Signed)
**Note De-Identified Jerome Vaughn Obfuscation** [employer] denied the pts Ranolazine tier exception. Reason: Pt must first try and fail lower tier medications on his plan which are Amiodarone and Pacerone (?). I have appealed this denial Jerome Vaughn form sent with the denial letter as the pt takes Ranolazine for angina due to CAD.  I have completed the Ranolazine tier exception appeal form and included notes to support the pts need for Ranolazine. I have emailed all 27 pages to Dr Darliss Ridgel nurse so she can fax all to [facility] at the fax number written on the cover letter included or to place in the to be faxed basket in Medical Records to be faxed.

## 2021-08-13 NOTE — Telephone Encounter (Signed)
Paperwork faxed °

## 2021-08-14 NOTE — Telephone Encounter (Signed)
Message sent thru MyChart 

## 2021-08-27 ENCOUNTER — Encounter: Payer: Medicare HMO | Admitting: Family Medicine

## 2021-08-30 ENCOUNTER — Ambulatory Visit (INDEPENDENT_AMBULATORY_CARE_PROVIDER_SITE_OTHER): Payer: Medicare HMO | Admitting: Family Medicine

## 2021-08-30 ENCOUNTER — Encounter: Payer: Self-pay | Admitting: Family Medicine

## 2021-08-30 VITALS — BP 118/80 | HR 87 | Temp 98.5°F | Ht 67.0 in | Wt 196.0 lb

## 2021-08-30 DIAGNOSIS — Z0000 Encounter for general adult medical examination without abnormal findings: Secondary | ICD-10-CM | POA: Diagnosis not present

## 2021-08-30 DIAGNOSIS — Z23 Encounter for immunization: Secondary | ICD-10-CM

## 2021-08-30 DIAGNOSIS — E118 Type 2 diabetes mellitus with unspecified complications: Secondary | ICD-10-CM | POA: Diagnosis not present

## 2021-08-30 MED ORDER — EZETIMIBE 10 MG PO TABS: 10.0000 mg | ORAL_TABLET | Freq: Every day | ORAL | 3 refills | Status: DC

## 2021-08-30 MED ORDER — ROSUVASTATIN CALCIUM 40 MG PO TABS: 40.0000 mg | ORAL_TABLET | Freq: Every day | ORAL | 3 refills | Status: DC

## 2021-08-30 MED ORDER — BENAZEPRIL HCL 20 MG PO TABS: 20.0000 mg | ORAL_TABLET | Freq: Every day | ORAL | 3 refills | Status: DC

## 2021-08-30 MED ORDER — SILDENAFIL CITRATE 100 MG PO TABS: ORAL_TABLET | ORAL | 5 refills | Status: DC

## 2021-08-30 NOTE — Addendum Note (Signed)
Addended by: Wyvonne Lenz on: 08/30/2021 02:55 PM   Modules accepted: Orders

## 2021-08-30 NOTE — Progress Notes (Signed)
   Subjective:    Patient ID: Jerome Vaughn, male    DOB: Mar 13, 1951, 70 y.o.   MRN: [id number]  HPI Here for a well exam. He feels well. He saw Dr. [name] recently and he seems to be doing well from a cardiac standpoint.    Review of Systems  Constitutional: Negative.   HENT: Negative.    Eyes: Negative.   Respiratory: Negative.    Cardiovascular: Negative.   Gastrointestinal: Negative.   Genitourinary: Negative.   Musculoskeletal: Negative.   Skin: Negative.   Neurological: Negative.   Psychiatric/Behavioral: Negative.        Objective:   Physical Exam Constitutional:      General: He is not in acute distress.    Appearance: Normal appearance. He is well-developed. He is not diaphoretic.  HENT:     Head: Normocephalic and atraumatic.     Right Ear: External ear normal.     Left Ear: External ear normal.     Nose: Nose normal.     Mouth/Throat:     Pharynx: No oropharyngeal exudate.  Eyes:     General: No scleral icterus.       Right eye: No discharge.        Left eye: No discharge.     Conjunctiva/sclera: Conjunctivae normal.     Pupils: Pupils are equal, round, and reactive to light.  Neck:     Thyroid: No thyromegaly.     Vascular: No JVD.     Trachea: No tracheal deviation.  Cardiovascular:     Rate and Rhythm: Normal rate and regular rhythm.     Heart sounds: Normal heart sounds. No murmur heard.   No friction rub. No gallop.  Pulmonary:     Effort: Pulmonary effort is normal. No respiratory distress.     Breath sounds: Normal breath sounds. No wheezing or rales.  Chest:     Chest wall: No tenderness.  Abdominal:     General: Bowel sounds are normal. There is no distension.     Palpations: Abdomen is soft. There is no mass.     Tenderness: There is no abdominal tenderness. There is no guarding or rebound.  Genitourinary:    Penis: Normal. No tenderness.      Testes: Normal.     Prostate: Normal.     Rectum: Normal. Guaiac result negative.   Musculoskeletal:        General: No tenderness. Normal range of motion.     Cervical back: Neck supple.  Lymphadenopathy:     Cervical: No cervical adenopathy.  Skin:    General: Skin is warm and dry.     Coloration: Skin is not pale.     Findings: No erythema or rash.  Neurological:     Mental Status: He is alert and oriented to person, place, and time.     Cranial Nerves: No cranial nerve deficit.     Motor: No abnormal muscle tone.     Coordination: Coordination normal.     Deep Tendon Reflexes: Reflexes are normal and symmetric. Reflexes normal.  Psychiatric:        Behavior: Behavior normal.        Thought Content: Thought content normal.        Judgment: Judgment normal.          Assessment & Plan:  Well exam. We discussed diet and exercise. Get fasting labs.  Alysia Penna, MD

## 2021-09-06 ENCOUNTER — Other Ambulatory Visit (INDEPENDENT_AMBULATORY_CARE_PROVIDER_SITE_OTHER): Payer: Medicare HMO

## 2021-09-06 DIAGNOSIS — Z0000 Encounter for general adult medical examination without abnormal findings: Secondary | ICD-10-CM | POA: Diagnosis not present

## 2021-09-06 LAB — CBC WITH DIFFERENTIAL/PLATELET
Basophils Absolute: 0 10*3/uL (ref 0.0–0.1)
Basophils Relative: 0.6 % (ref 0.0–3.0)
Eosinophils Absolute: 0 10*3/uL (ref 0.0–0.7)
Eosinophils Relative: 1.2 % (ref 0.0–5.0)
HCT: 39.5 % (ref 39.0–52.0)
Hemoglobin: 13.2 g/dL (ref 13.0–17.0)
Lymphocytes Relative: 37.3 % (ref 12.0–46.0)
Lymphs Abs: 1.4 10*3/uL (ref 0.7–4.0)
MCHC: 33.5 g/dL (ref 30.0–36.0)
MCV: 90.3 fl (ref 78.0–100.0)
Monocytes Absolute: 0.5 10*3/uL (ref 0.1–1.0)
Monocytes Relative: 13.1 % — ABNORMAL HIGH (ref 3.0–12.0)
Neutro Abs: 1.8 10*3/uL (ref 1.4–7.7)
Neutrophils Relative %: 47.8 % (ref 43.0–77.0)
Platelets: 148 10*3/uL — ABNORMAL LOW (ref 150.0–400.0)
RBC: 4.38 Mil/uL (ref 4.22–5.81)
RDW: 13 % (ref 11.5–15.5)
WBC: 3.7 10*3/uL — ABNORMAL LOW (ref 4.0–10.5)

## 2021-09-06 LAB — LIPID PANEL
Cholesterol: 131 mg/dL (ref 0–200)
HDL: 54.7 mg/dL (ref >39.00)
LDL Cholesterol: 53 mg/dL (ref 0–99)
NonHDL: 76.19
Total CHOL/HDL Ratio: 2
Triglycerides: 114 mg/dL (ref 0.0–149.0)
VLDL: 22.8 mg/dL (ref 0.0–40.0)

## 2021-09-06 LAB — HEPATIC FUNCTION PANEL
ALT: 33 U/L (ref 0–53)
AST: 25 U/L (ref 0–37)
Albumin: 4.3 g/dL (ref 3.5–5.2)
Alkaline Phosphatase: 32 U/L — ABNORMAL LOW (ref 39–117)
Bilirubin, Direct: 0.1 mg/dL (ref 0.0–0.3)
Total Bilirubin: 0.5 mg/dL (ref 0.2–1.2)
Total Protein: 7.6 g/dL (ref 6.0–8.3)

## 2021-09-06 LAB — BASIC METABOLIC PANEL
BUN: 22 mg/dL (ref 6–23)
CO2: 28 mEq/L (ref 19–32)
Calcium: 9.4 mg/dL (ref 8.4–10.5)
Chloride: 100 mEq/L (ref 96–112)
Creatinine, Ser: 1.37 mg/dL (ref 0.40–1.50)
GFR: 53.15 mL/min — ABNORMAL LOW (ref >60.00)
Glucose, Bld: 188 mg/dL — ABNORMAL HIGH (ref 70–99)
Potassium: 4.9 mEq/L (ref 3.5–5.1)
Sodium: 134 mEq/L — ABNORMAL LOW (ref 135–145)

## 2021-09-06 LAB — HEMOGLOBIN A1C: Hgb A1c MFr Bld: 8 % — ABNORMAL HIGH (ref 4.6–6.5)

## 2021-09-06 LAB — TSH: TSH: 0.78 u[IU]/mL (ref 0.35–5.50)

## 2021-09-06 LAB — PSA: PSA: 0.28 ng/mL (ref 0.10–4.00)

## 2021-09-06 NOTE — Addendum Note (Signed)
Addended by: Alysia Penna A on: 09/06/2021 04:33 PM   Modules accepted: Orders

## 2021-09-08 ENCOUNTER — Other Ambulatory Visit: Payer: Self-pay | Admitting: Endocrinology

## 2021-09-13 ENCOUNTER — Ambulatory Visit: Payer: Medicare HMO | Admitting: Podiatry

## 2021-09-16 ENCOUNTER — Telehealth: Payer: Self-pay | Admitting: Pharmacist

## 2021-09-16 NOTE — Chronic Care Management (AMB) (Signed)
Chronic Care Management Pharmacy Assistant   Name: Jerome Vaughn  MRN: [id number] DOB: 1950-12-30  Reason for Encounter: Disease State / Diabetes Assessment Call   Conditions to be addressed/monitored: DMII  Recent office visits:  08/30/2021 Alysia Penna MD - Patient was seen for preventative health care and additional issues. No medication changes. No follow up noted.  Recent consult visits:  08/05/2021 Daneen Schick MD (cardiology) - Patient was seen for coronary artery disease involving native coronary artery of native heart without angina pectoris and additional issues. Discontinued Albuterol. Follow up in 1 year.  Hospital visits:  None  Medications: Outpatient Encounter Medications as of 09/16/2021  Medication Sig   Accu-Chek Softclix Lancets lancets 1 each by Other route daily. Use to monitor glucose levels daily; E11.8   acetaminophen (TYLENOL) 500 MG tablet Take 500 mg by mouth as needed.   APPLE CIDER VINEGAR PO Take by mouth daily.    Ascorbic Acid (VITAMIN C PO) Take 1 tablet by mouth daily.   aspirin EC 81 MG tablet Take 1 tablet (81 mg total) by mouth daily.   benazepril (LOTENSIN) 20 MG tablet Take 1 tablet (20 mg total) by mouth daily.   Blood Glucose Monitoring Suppl (ACCU-CHEK AVIVA PLUS) w/Device KIT 1 each by Does not apply route daily. Use to monitor glucose levels daily; E11.8   bromocriptine (PARLODEL) 2.5 MG tablet Take 1 tablet (2.5 mg total) by mouth daily.   carvedilol (COREG) 3.125 MG tablet Take 1 tablet (3.125 mg total) by mouth 2 (two) times daily with a meal.   Cinnamon 500 MG capsule Take 1 capsule by mouth daily.   Coenzyme Q10 (COQ-10) 100 MG CAPS Take 1 tablet by mouth daily.   Cyanocobalamin (VITAMIN B-12 PO) Take 1 tablet by mouth daily.   ezetimibe (ZETIA) 10 MG tablet Take 1 tablet (10 mg total) by mouth daily.   ferrous sulfate 325 (65 FE) MG tablet Take 325 mg by mouth daily with breakfast.   Garlic 9833 MG CAPS Take 1 tablet by mouth  daily.   glucose blood (ACCU-CHEK AVIVA PLUS) test strip 1 each by Other route daily. Use to monitor glucose levels daily; E11.8   metFORMIN (GLUCOPHAGE-XR) 500 MG 24 hr tablet Take 4 tablets (2,000 mg total) by mouth daily.   Multiple Vitamin (MULTIVITAMIN) tablet Take 1 tablet by mouth daily.   nitroGLYCERIN (NITROSTAT) 0.4 MG SL tablet Place 1 tablet (0.4 mg total) under the tongue every 5 (five) minutes as needed for chest pain.   Omega-3 Fatty Acids (FISH OIL PO) Take 1 capsule by mouth daily.   pantoprazole (PROTONIX) 40 MG tablet Take 1 tablet (40 mg total) by mouth 2 (two) times daily.   ranolazine (RANEXA) 500 MG 12 hr tablet Take 1 tablet (500 mg total) by mouth 2 (two) times daily.   repaglinide (PRANDIN) 0.5 MG tablet Take 1 tablet (0.5 mg total) by mouth 2 (two) times daily before a meal.   rosuvastatin (CRESTOR) 40 MG tablet Take 1 tablet (40 mg total) by mouth daily.   sildenafil (VIAGRA) 100 MG tablet TAKE ONE TABLET BY MOUTH DAILY AS NEEDED FOR ERECTILE DYSFUNCTION   VITAMIN E PO Take 1 capsule by mouth daily.   No facility-administered encounter medications on file as of 09/16/2021.  Fill History: benazepril (LOTENSIN) tablet 06/21/2021 90   bromocriptine (PARLODEL) tablet 2.5 mg 06/14/2021 90   carvedilol (COREG) tablet 08/13/2021 90   ezetimibe (ZETIA) tablet 10 mg 06/04/2021 90   pantoprazole (PROTONIX)  EC tablet 08/13/2021 90   ranolazine (RANEXA) 12 hr tablet 07/31/2021 90   repaglinide (PRANDIN) tablet 03/31/2021 90   rosuvastatin (CRESTOR) tablet 06/17/2021 90   metFORMIN (GLUCOPHAGE-XR) 24 hr tablet 06/14/2021 90   Recent Relevant Labs: Lab Results  Component Value Date/Time   HGBA1C 8.0 (H) 09/06/2021 09:15 AM   HGBA1C 6.9 (A) 06/14/2021 08:21 AM   HGBA1C 9.8 (A) 12/28/2020 10:43 AM   HGBA1C 7.6 (H) 11/21/2015 06:20 PM   MICROALBUR 1.2 08/22/2014 10:34 AM   MICROALBUR 1.6 03/28/2011 11:41 AM    Kidney Function Lab Results  Component Value  Date/Time   CREATININE 1.37 09/06/2021 09:15 AM   CREATININE 1.32 (H) 04/10/2021 10:48 AM   CREATININE 1.41 (H) 09/26/2020 03:11 PM   CREATININE 1.19 11/14/2015 11:35 AM   GFR 53.15 (L) 09/06/2021 09:15 AM   GFRNONAA 59 (L) 04/10/2021 10:48 AM   GFRAA >60 06/14/2020 02:19 PM    Current antihyperglycemic regimen:  Metformin ER 500 mg - take 4 tablets once daily Repaglinide 0.5 mg - take 1 tablet twice daily  What recent interventions/DTPs have been made to improve glycemic control:    Have there been any recent hospitalizations or ED visits since last visit with [facility]PP?   Patient hypoglycemic symptoms, including   Patient hyperglycemic symptoms, including   How often are you checking your blood sugar?   What are your blood sugars ranging?  Fasting:  Before meals:  After meals:  Bedtime:   During the week, how often does your blood glucose drop below 70?   Are you checking your feet daily/regularly?   Adherence Review: Is the patient currently on a STATIN medication?  Is the patient currently on ACE/ARB medication?  Does the patient have >5 Bennette gap between last estimated fill dates?   Unable to reach patient after several attempts  Care Gaps: AWV - message sent to Ramond Craver to schedule Covid 19 vaccine booster 3 - overdue Tetanus/TDAP - overdue  HGA1C - 6.9 on 06/14/2021  Star Rating Drugs: Benazepril 53m - [facility] 500 mg - [facility] Repaglinide 0.5 mg - [facility] Rosuvastatin 447m- [facility] [phone]

## 2021-09-20 ENCOUNTER — Ambulatory Visit: Payer: Medicare HMO | Admitting: Podiatry

## 2021-09-24 ENCOUNTER — Other Ambulatory Visit: Payer: Self-pay | Admitting: *Deleted

## 2021-09-24 DIAGNOSIS — D500 Iron deficiency anemia secondary to blood loss (chronic): Secondary | ICD-10-CM

## 2021-09-25 ENCOUNTER — Inpatient Hospital Stay: Payer: Medicare HMO

## 2021-09-26 ENCOUNTER — Telehealth: Payer: Self-pay | Admitting: Oncology

## 2021-09-26 NOTE — Telephone Encounter (Signed)
Scheduled per sch msg. Called and spoke with patient. Confirmed appt  

## 2021-10-04 ENCOUNTER — Other Ambulatory Visit: Payer: Self-pay

## 2021-10-04 ENCOUNTER — Ambulatory Visit (INDEPENDENT_AMBULATORY_CARE_PROVIDER_SITE_OTHER): Payer: Medicare HMO | Admitting: Podiatry

## 2021-10-04 ENCOUNTER — Telehealth: Payer: Self-pay | Admitting: Family Medicine

## 2021-10-04 ENCOUNTER — Inpatient Hospital Stay: Payer: Medicare HMO | Attending: Podiatry

## 2021-10-04 ENCOUNTER — Other Ambulatory Visit: Payer: Self-pay | Admitting: Family Medicine

## 2021-10-04 DIAGNOSIS — M79674 Pain in right toe(s): Secondary | ICD-10-CM

## 2021-10-04 DIAGNOSIS — D509 Iron deficiency anemia, unspecified: Secondary | ICD-10-CM | POA: Insufficient documentation

## 2021-10-04 DIAGNOSIS — E118 Type 2 diabetes mellitus with unspecified complications: Secondary | ICD-10-CM

## 2021-10-04 DIAGNOSIS — D696 Thrombocytopenia, unspecified: Secondary | ICD-10-CM | POA: Diagnosis not present

## 2021-10-04 DIAGNOSIS — B351 Tinea unguium: Secondary | ICD-10-CM | POA: Diagnosis not present

## 2021-10-04 DIAGNOSIS — D472 Monoclonal gammopathy: Secondary | ICD-10-CM | POA: Insufficient documentation

## 2021-10-04 DIAGNOSIS — M79675 Pain in left toe(s): Secondary | ICD-10-CM | POA: Diagnosis not present

## 2021-10-04 DIAGNOSIS — Z79899 Other long term (current) drug therapy: Secondary | ICD-10-CM | POA: Diagnosis not present

## 2021-10-04 DIAGNOSIS — D500 Iron deficiency anemia secondary to blood loss (chronic): Secondary | ICD-10-CM

## 2021-10-04 LAB — CBC WITH DIFFERENTIAL (CANCER CENTER ONLY)
Abs Immature Granulocytes: 0.01 10*3/uL (ref 0.00–0.07)
Basophils Absolute: 0 10*3/uL (ref 0.0–0.1)
Basophils Relative: 1 %
Eosinophils Absolute: 0.1 10*3/uL (ref 0.0–0.5)
Eosinophils Relative: 1 %
HCT: 40.5 % (ref 39.0–52.0)
Hemoglobin: 14 g/dL (ref 13.0–17.0)
Immature Granulocytes: 0 %
Lymphocytes Relative: 34 %
Lymphs Abs: 1.7 10*3/uL (ref 0.7–4.0)
MCH: 30.2 pg (ref 26.0–34.0)
MCHC: 34.6 g/dL (ref 30.0–36.0)
MCV: 87.5 fL (ref 80.0–100.0)
Monocytes Absolute: 0.5 10*3/uL (ref 0.1–1.0)
Monocytes Relative: 11 %
Neutro Abs: 2.7 10*3/uL (ref 1.7–7.7)
Neutrophils Relative %: 53 %
Platelet Count: 158 10*3/uL (ref 150–400)
RBC: 4.63 MIL/uL (ref 4.22–5.81)
RDW: 12.1 % (ref 11.5–15.5)
WBC Count: 5 10*3/uL (ref 4.0–10.5)
nRBC: 0 % (ref 0.0–0.2)

## 2021-10-04 LAB — CMP (CANCER CENTER ONLY)
ALT: 41 U/L (ref 0–44)
AST: 33 U/L (ref 15–41)
Albumin: 4.4 g/dL (ref 3.5–5.0)
Alkaline Phosphatase: 36 U/L — ABNORMAL LOW (ref 38–126)
Anion gap: 10 (ref 5–15)
BUN: 20 mg/dL (ref 8–23)
CO2: 22 mmol/L (ref 22–32)
Calcium: 9.8 mg/dL (ref 8.9–10.3)
Chloride: 104 mmol/L (ref 98–111)
Creatinine: 1.29 mg/dL — ABNORMAL HIGH (ref 0.61–1.24)
GFR, Estimated: >60 mL/min (ref >60)
Glucose, Bld: 134 mg/dL — ABNORMAL HIGH (ref 70–99)
Potassium: 4.5 mmol/L (ref 3.5–5.1)
Sodium: 136 mmol/L (ref 135–145)
Total Bilirubin: 0.5 mg/dL (ref 0.3–1.2)
Total Protein: 8.4 g/dL — ABNORMAL HIGH (ref 6.5–8.1)

## 2021-10-04 MED ORDER — TADALAFIL 20 MG PO TABS: 20.0000 mg | ORAL_TABLET | ORAL | 11 refills | Status: DC | PRN

## 2021-10-04 MED ORDER — CICLOPIROX 8 % EX SOLN: Freq: Every day | CUTANEOUS | 0 refills | Status: DC

## 2021-10-04 NOTE — Addendum Note (Signed)
Addended by: Alysia Penna A on: 10/04/2021 04:29 PM   Modules accepted: Orders

## 2021-10-04 NOTE — Telephone Encounter (Signed)
Requesting prescription for Cialis.     Pharmacy updated.

## 2021-10-04 NOTE — Telephone Encounter (Signed)
I sent in the Cialis

## 2021-10-04 NOTE — Telephone Encounter (Signed)
Pt is calling and no long want to take sildenafil 100mg . Pt would like to know if md would prescribed cialis please send to  [facility] [id number] [place], [place] Phone:  [phone]  Fax:  [phone]

## 2021-10-04 NOTE — Telephone Encounter (Signed)
Message sent to pt via My Chart

## 2021-10-07 LAB — KAPPA/LAMBDA LIGHT CHAINS
Kappa free light chain: 29.2 mg/L — ABNORMAL HIGH (ref 3.3–19.4)
Kappa, lambda light chain ratio: 1.13 (ref 0.26–1.65)
Lambda free light chains: 25.9 mg/L (ref 5.7–26.3)

## 2021-10-07 NOTE — Telephone Encounter (Signed)
Patient called to get refill on rosuvastatin (CRESTOR) 40 MG tablet    Please send to   [facility], [place] Phone:  [phone]  Fax:  [phone]        Please advise

## 2021-10-09 ENCOUNTER — Encounter: Payer: Self-pay | Admitting: Podiatry

## 2021-10-09 ENCOUNTER — Inpatient Hospital Stay: Payer: Medicare HMO | Admitting: Oncology

## 2021-10-09 NOTE — Progress Notes (Signed)
°  Subjective:  Patient ID: Jerome Vaughn, male    DOB: 08/23/1951,  MRN: [id number]  Chief Complaint  Patient presents with   Nail Problem    Nail trim    71 y.o. male returns for the above complaint.  Patient presents with complaint of thickened elongated dystrophic toenails x10.  They are painful to touch.  Patient is a diabetic with last A1c of 9.8.  He is not able to do it himself.  He would like for me to debride them now.  He denies any other acute complaints.  Objective:  There were no vitals filed for this visit. Podiatric Exam: Vascular: dorsalis pedis and posterior tibial pulses are palpable bilateral. Capillary return is immediate. Temperature gradient is WNL. Skin turgor WNL  Sensorium: Decreased Semmes Weinstein monofilament test.  Decreased tactile sensation bilaterally. Nail Exam: Pt has thick disfigured discolored nails with subungual debris noted bilateral entire nail hallux through fifth toenails.  Pain on palpation to the nails. Ulcer Exam: There is no evidence of ulcer or pre-ulcerative changes or infection. Orthopedic Exam: Muscle tone and strength are WNL. No limitations in general ROM. No crepitus or effusions noted. HAV  B/L.  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 Skin: No Porokeratosis. No infection or ulcers    Assessment & Plan:   1. Type 2 diabetes mellitus with complication, without long-term current use of insulin (HCC)   2. Pain due to onychomycosis of toenails of both feet       Patient was evaluated and treated and all questions answered.  Onychomycosis with pain  -Nails palliatively debrided as below. -Educated on self-care -Penlac was sent for nail fungus.  Advised him apply twice a Radick can take 6 to 8 months to resolve.  He states understanding  Procedure: Nail Debridement Rationale: pain  Type of Debridement: manual, sharp debridement. Instrumentation: Nail nipper, rotary burr. Number of Nails: 10  Procedures and Treatment: Consent by patient was obtained  for treatment procedures. The patient understood the discussion of treatment and procedures well. All questions were answered thoroughly reviewed. Debridement of mycotic and hypertrophic toenails, 1 through 5 bilateral and clearing of subungual debris. No ulceration, no infection noted.  Return Visit-Office Procedure: Patient instructed to return to the office for a follow up visit 3 months for continued evaluation and treatment.  [name], DPM    No follow-ups on file.

## 2021-10-11 ENCOUNTER — Other Ambulatory Visit: Payer: Self-pay

## 2021-10-11 ENCOUNTER — Inpatient Hospital Stay (HOSPITAL_BASED_OUTPATIENT_CLINIC_OR_DEPARTMENT_OTHER): Payer: Medicare HMO | Admitting: Adult Health

## 2021-10-11 VITALS — BP 137/76 | HR 96 | Temp 98.2°F | Resp 18 | Wt 194.5 lb

## 2021-10-11 DIAGNOSIS — D509 Iron deficiency anemia, unspecified: Secondary | ICD-10-CM | POA: Diagnosis not present

## 2021-10-11 DIAGNOSIS — D472 Monoclonal gammopathy: Secondary | ICD-10-CM

## 2021-10-11 DIAGNOSIS — Z79899 Other long term (current) drug therapy: Secondary | ICD-10-CM | POA: Diagnosis not present

## 2021-10-11 DIAGNOSIS — D696 Thrombocytopenia, unspecified: Secondary | ICD-10-CM | POA: Diagnosis not present

## 2021-10-11 LAB — MULTIPLE MYELOMA PANEL, SERUM
Albumin SerPl Elph-Mcnc: 3.8 g/dL (ref 2.9–4.4)
Albumin/Glob SerPl: 1 (ref 0.7–1.7)
Alpha 1: 0.2 g/dL (ref 0.0–0.4)
Alpha2 Glob SerPl Elph-Mcnc: 0.9 g/dL (ref 0.4–1.0)
B-Globulin SerPl Elph-Mcnc: 1 g/dL (ref 0.7–1.3)
Gamma Glob SerPl Elph-Mcnc: 1.9 g/dL — ABNORMAL HIGH (ref 0.4–1.8)
Globulin, Total: 4 g/dL — ABNORMAL HIGH (ref 2.2–3.9)
IgA: 102 mg/dL (ref 61–437)
IgG (Immunoglobin G), Serum: 1943 mg/dL — ABNORMAL HIGH (ref 603–1613)
IgM (Immunoglobulin M), Srm: 33 mg/dL (ref 20–172)
M Protein SerPl Elph-Mcnc: 1.3 g/dL — ABNORMAL HIGH
Total Protein ELP: 7.8 g/dL (ref 6.0–8.5)

## 2021-10-11 NOTE — Progress Notes (Signed)
Jerome Vaughn  [phone] [phone]     ID: Jerome Vaughn DOB: 11-15-1950  MR#: [id number]  UDJ#:[id number]  Patient Care Team: [name], MD as PCP - General (Family Medicine) [name], MD as PCP - Cardiology (Cardiology) Earnie Larsson, [facility] as Pharmacist (Pharmacist) Thornton Park, MD as Consulting Physician (Gastroenterology) Magrinat, Virgie Dad, MD as Consulting Physician (Oncology) [name], MD as Consulting Physician (Endocrinology) Scot Dock, NP OTHER MD:  CHIEF COMPLAINT: Iron deficiency anemia; MGUS  CURRENT TREATMENT:  observation   INTERVAL HISTORY: Jerome Vaughn returns here today for follow up of his history of iron deficiency anemia and MGUS. He continues under observation.  He tells me that he is feeling quire well today.  He denies any new pain, or other concerns today.  He has no anemia, his creatinine remains stable.  His calcium remains stable.    We are following his M protein and his kappa lambda ratio  Latest Reference Range & Units 04/10/21 10:48 10/04/21 14:07  M Protein SerPl Elph-Mcnc Not Observed g/dL 1.1 (H) (C) 1.3 (H) (C)  (H): Data is abnormally high (C): Corrected  Latest Reference Range & Units 09/26/20 15:11 04/10/21 10:48 10/04/21 14:07  Kappa, lambda light chain ratio 0.26 - 1.65  0.93 0.89 1.13    REVIEW OF SYSTEMS: Review of Systems  Constitutional:  Negative for appetite change, chills, fatigue, fever and unexpected weight change.  HENT:   Negative for hearing loss, lump/mass and trouble swallowing.   Eyes:  Negative for eye problems and icterus.  Respiratory:  Negative for chest tightness, cough and shortness of breath.   Cardiovascular:  Negative for chest pain, leg swelling and palpitations.  Gastrointestinal:  Negative for abdominal distention, abdominal pain, constipation, diarrhea, nausea and vomiting.  Endocrine: Negative for hot flashes.  Genitourinary:  Negative for  difficulty urinating.   Musculoskeletal:  Negative for arthralgias.  Skin:  Negative for itching and rash.  Neurological:  Negative for dizziness, extremity weakness, headaches and numbness.  Hematological:  Negative for adenopathy. Does not bruise/bleed easily.  Psychiatric/Behavioral:  Negative for depression. The patient is not nervous/anxious.      COVID 19 VACCINATION STATUS: Status post Coca-Cola x2, with booster in October 2021   HISTORY OF CURRENT ILLNESS: From the original intake note:  Jerome Vaughn is here 02/22/2020 for evaluation of pancytopenia that was first noted about 4 weeks ago.  He says that from April to May he noted bright red bleeding from his rectum.  He says that this was not just blood on toilet paper, but rather, in the bowl and dripping.  He denies constipation during this time period.  This was accompanied by GI growling and indigestion.  Due to this he changed around his diet and started taking Pepcid.  He also got into see his PCP and get a GI referral which took place on 01/24/2020 when the pancytopenia was noted.  An abdominal ultrasound was performed on 02/21/2020 which showed no enlarged spleen, or liver issues.    His CBC has been as follows:  Results for Jerome Vaughn, Jerome Vaughn (MRN [id number]) as of 02/22/2020 10:07  Ref. Range 10/15/2017 12:38 04/08/2018 08:51 12/12/2018 19:08 04/11/2019 09:24 10/26/2019 09:03 01/24/2020 12:44  WBC Latest Ref Range: 4.0 - 10.5 K/uL 4.7 4.0 6.5 3.1 (L) 4.2 3.2 (L)  RBC Latest Ref Range: 4.22 - 5.81 Mil/uL 4.59 4.67 4.40 3.91 (L) 4.11 (L) 4.03 (L)  Hemoglobin Latest Ref Range: 13.0 - 17.0 g/dL 13.3 14.2  13.5 12.1 (L) 12.8 (L) 12.3 (L)  HCT Latest Ref Range: 39.0 - 52.0 % 40.6 41.7 41.6 35.3 (L) 37.6 36.3 (L)  MCV Latest Ref Range: 78.0 - 100.0 fl 89 89.3 94.5 90.4 92 89.9  MCH Latest Ref Range: 26.6 - 33.0 pg 29.0  30.7  31.1   MCHC Latest Ref Range: 30.0 - 36.0 g/dL 32.8 34.1 32.5 34.2 34.0 33.9  RDW Latest Ref Range: 11.5 - 15.5 % 14.4 13.7 13.0  13.9 12.4 13.4  Platelets Latest Ref Range: 150.0 - 400.0 K/uL 143 (L) 136.0 (L) 125 (L) 130.0 (L) 140 (L) 130.0 (L)   The patient's subsequent history is as detailed below.   PAST MEDICAL HISTORY: Past Medical History:  Diagnosis Date   Carpal tunnel syndrome    both hands   Colon polyps    Coronary artery disease    a. Cath 11/21/2015: Multivessel CAD --> CABG recommended. b. CABG on 11/22/2015:  LIMA-LAD, SVG-Diag, SVG-OM1 and distal Cx, and SVG-PDA.   Diabetes mellitus    GERD (gastroesophageal reflux disease)    History of echocardiogram    a. Echo 4/17: Moderate LVH, EF 50-55%, mild LAE, no pericardial effusion   Hyperlipidemia    Hypertension    Pneumonia     PAST SURGICAL HISTORY: Past Surgical History:  Procedure Laterality Date   CARDIAC CATHETERIZATION N/A 11/21/2015   Procedure: Left Heart Cath and Coronary Angiography;  Surgeon: [name], MD;  Location: [place] [facility];  Service: Cardiovascular;  Laterality: N/A;   COLONOSCOPY  02/23/2020   per Dr. Tarri Glenn, tubular adenomas, repeat in 3 yrs    colonsocopy  12/07/2009   per Dr. [name], benign polyps, repeat in 10 yrs    CORONARY ARTERY BYPASS GRAFT N/A 11/22/2015   Procedure: CORONARY ARTERY BYPASS GRAFTING (CABG) times five using the left internal mammary, right greater saphenous vein EVH, and left thigh greater saphenous vein EVH;  Surgeon: [name], MD;  Location: [facility];  Service: Open Heart Surgery;  Laterality: N/A;   frozen shoulder release Right    INTRAVASCULAR PRESSURE WIRE/FFR STUDY N/A 12/14/2018   Procedure: INTRAVASCULAR PRESSURE WIRE/FFR STUDY;  Surgeon: Jettie Booze, MD;  Location: [place] [facility];  Service: Cardiovascular;  Laterality: N/A;   LEFT HEART CATH AND CORS/GRAFTS ANGIOGRAPHY N/A 10/27/2017   Procedure: LEFT HEART CATH AND CORS/GRAFTS ANGIOGRAPHY;  Surgeon: [name], MD;  Location: [place] [facility];  Service: Cardiovascular;  Laterality: N/A;   LEFT HEART CATH  AND CORS/GRAFTS ANGIOGRAPHY N/A 12/14/2018   Procedure: LEFT HEART CATH AND CORS/GRAFTS ANGIOGRAPHY;  Surgeon: Jettie Booze, MD;  Location: [place] [facility];  Service: Cardiovascular;  Laterality: N/A;   TEE WITHOUT CARDIOVERSION N/A 11/22/2015   Procedure: TRANSESOPHAGEAL ECHOCARDIOGRAM (TEE);  Surgeon: [name], MD;  Location: [facility];  Service: Open Heart Surgery;  Laterality: N/A;   WRIST GANGLION EXCISION Right     FAMILY HISTORY Family History  Problem Relation Age of Onset   Heart failure Father    19 Brother        AAA   Hyperlipidemia Sister    Diabetes Sister    Hypertension Sister    Kidney failure Sister    Diabetes Maternal Grandmother    Hyperlipidemia Sister    Diabetes Sister    Colon polyps Sister    Kidney disease Sister    Other Brother        COVID 40   Diabetes Son  Heart disease Son    Heart attack Neg Hx    Colon cancer Neg Hx    Stomach cancer Neg Hx    Esophageal cancer Neg Hx     SOCIAL HISTORY: Married, lives in [place] with his wife, Lupita Dawn of 30 plus years.  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  He walks frequently and stays active on his off days.  His granddaughter dominique who is 18 also lives in his home.  He has 5 kids, ages 71-48.  Two are in [place] and the rest are spread over the [place].  He has 6 grandchildren.  No previous hazardous exposures.    ADVANCED DIRECTIVES: Not in place   HEALTH MAINTENANCE: Social History   Tobacco Use   Smoking status: Former    Packs/Loescher: 0.25    Years: 20.00    Pack years: 5.00    Types: Cigarettes    Quit date: 10/07/2013    Years since quitting: 8.0   Smokeless tobacco: Never   Tobacco comments:    quit cigarettes, might have a cigar 1 X month  Vaping Use   Vaping Use: Never used  Substance Use Topics   Alcohol use: Yes    Alcohol/week: 3.0 - 4.0 standard drinks    Types: 3 - 4 Standard  drinks or equivalent per week   Drug use: No     Colonoscopy: 01/2020 (Dr. Tarri Glenn), recall 2024  PSA: 07/2020, normal   No Known Allergies  Current Outpatient Medications  Medication Sig Dispense Refill   Accu-Chek Softclix Lancets lancets 1 each by Other route daily. Use to monitor glucose levels daily; E11.8 100 each 2   acetaminophen (TYLENOL) 500 MG tablet Take 500 mg by mouth as needed.     APPLE CIDER VINEGAR PO Take by mouth daily.      Ascorbic Acid (VITAMIN C PO) Take 1 tablet by mouth daily.     aspirin EC 81 MG tablet Take 1 tablet (81 mg total) by mouth daily.     benazepril (LOTENSIN) 20 MG tablet Take 1 tablet (20 mg total) by mouth daily. 90 tablet 3   Blood Glucose Monitoring Suppl (ACCU-CHEK AVIVA PLUS) w/Device KIT 1 each by Does not apply route daily. Use to monitor glucose levels daily; E11.8 1 kit 0   bromocriptine (PARLODEL) 2.5 MG tablet Take 1 tablet (2.5 mg total) by mouth daily. 90 tablet 3   carvedilol (COREG) 3.125 MG tablet Take 1 tablet (3.125 mg total) by mouth 2 (two) times daily with a meal. 180 tablet 3   ciclopirox (PENLAC) 8 % solution Apply topically at bedtime. Apply over nail and surrounding skin. Apply daily over previous coat. After seven (7) days, may remove with alcohol and continue cycle. 6.6 mL 0   Cinnamon 500 MG capsule Take 1 capsule by mouth daily.     Coenzyme Q10 (COQ-10) 100 MG CAPS Take 1 tablet by mouth daily.     Cyanocobalamin (VITAMIN B-12 PO) Take 1 tablet by mouth daily.     ezetimibe (ZETIA) 10 MG tablet Take 1 tablet (10 mg total) by mouth daily. 90 tablet 3   ferrous sulfate 325 (65 FE) MG tablet Take 325 mg by mouth daily with breakfast.     Garlic 6834 MG CAPS Take 1 tablet by mouth daily.     glucose blood (ACCU-CHEK AVIVA PLUS) test strip 1 each by Other route daily. Use to monitor  glucose levels daily; E11.8 100 each 2   metFORMIN (GLUCOPHAGE-XR) 500 MG 24 hr tablet Take 4 tablets (2,000 mg total) by mouth daily. 360  tablet 3   Multiple Vitamin (MULTIVITAMIN) tablet Take 1 tablet by mouth daily.     nitroGLYCERIN (NITROSTAT) 0.4 MG SL tablet Place 1 tablet (0.4 mg total) under the tongue every 5 (five) minutes as needed for chest pain. 75 tablet 1   Omega-3 Fatty Acids (FISH OIL PO) Take 1 capsule by mouth daily.     pantoprazole (PROTONIX) 40 MG tablet Take 1 tablet (40 mg total) by mouth 2 (two) times daily. 180 tablet 3   ranolazine (RANEXA) 500 MG 12 hr tablet Take 1 tablet (500 mg total) by mouth 2 (two) times daily. 180 tablet 0   repaglinide (PRANDIN) 0.5 MG tablet Take 1 tablet (0.5 mg total) by mouth 2 (two) times daily before a meal. 180 tablet 3   rosuvastatin (CRESTOR) 40 MG tablet TAKE 1 TABLET EVERY Stickler 90 tablet 1   sildenafil (VIAGRA) 100 MG tablet TAKE ONE TABLET BY MOUTH DAILY AS NEEDED FOR ERECTILE DYSFUNCTION 30 tablet 5   tadalafil (CIALIS) 20 MG tablet Take 1 tablet (20 mg total) by mouth as needed for erectile dysfunction. 10 tablet 11   VITAMIN E PO Take 1 capsule by mouth daily.     No current facility-administered medications for this visit.    OBJECTIVE: African-American Vaughn who appears well  Vitals:   10/11/21 1416  BP: 137/76  Pulse: 96  Resp: 18  Temp: 98.2 F (36.8 C)  SpO2: 94%     Body mass index is 30.46 kg/m.   Wt Readings from Last 3 Encounters:  10/11/21 194 lb 8 oz (88.2 kg)  08/30/21 196 lb (88.9 kg)  08/05/21 200 lb (90.7 kg)     ECOG FS:1 - Symptomatic but completely ambulatory  GENERAL: Patient is a well appearing male in no acute distress HEENT:  Sclerae anicteric.  Oropharynx clear and moist. No ulcerations or evidence of oropharyngeal candidiasis. Neck is supple.  NODES:  No cervical, supraclavicular, or axillary lymphadenopathy palpated.  LUNGS:  Clear to auscultation bilaterally.  No wheezes or rhonchi. HEART:  Regular rate and rhythm. No murmur appreciated. ABDOMEN:  Soft, nontender.  Positive, normoactive bowel sounds. No organomegaly  palpated. MSK:  No focal spinal tenderness to palpation.  EXTREMITIES:  No peripheral edema.   SKIN:  Clear with no obvious rashes or skin changes.  NEURO:  Nonfocal. Well oriented.  Appropriate affect.    LAB RESULTS:  CMP     Component Value Date/Time   NA 136 10/04/2021 1407   NA 141 10/26/2019 0903   K 4.5 10/04/2021 1407   CL 104 10/04/2021 1407   CO2 22 10/04/2021 1407   GLUCOSE 134 (H) 10/04/2021 1407   BUN 20 10/04/2021 1407   BUN 14 10/26/2019 0903   CREATININE 1.29 (H) 10/04/2021 1407   CREATININE 1.19 11/14/2015 1135   CALCIUM 9.8 10/04/2021 1407   PROT 8.4 (H) 10/04/2021 1407   PROT 7.6 10/26/2019 0903   ALBUMIN 4.4 10/04/2021 1407   ALBUMIN 4.4 10/26/2019 0903   AST 33 10/04/2021 1407   ALT 41 10/04/2021 1407   ALKPHOS 36 (L) 10/04/2021 1407   BILITOT 0.5 10/04/2021 1407   GFRNONAA >60 10/04/2021 1407   GFRAA >60 06/14/2020 1419    Lab Results  Component Value Date   TOTALPROTELP 7.8 04/10/2021   ALBUMINELP 3.9 02/22/2020   A1GS 0.2 02/22/2020  A2GS 0.8 02/22/2020   BETS 1.0 02/22/2020   GAMS 1.7 02/22/2020   MSPIKE 1.2 (H) 02/22/2020     Lab Results  Component Value Date   KPAFRELGTCHN 29.2 (H) 10/04/2021   LAMBDASER 25.9 10/04/2021   KAPLAMBRATIO 1.13 10/04/2021    Lab Results  Component Value Date   WBC 5.0 10/04/2021   NEUTROABS 2.7 10/04/2021   HGB 14.0 10/04/2021   HCT 40.5 10/04/2021   MCV 87.5 10/04/2021   PLT 158 10/04/2021      Chemistry      Component Value Date/Time   NA 136 10/04/2021 1407   NA 141 10/26/2019 0903   K 4.5 10/04/2021 1407   CL 104 10/04/2021 1407   CO2 22 10/04/2021 1407   BUN 20 10/04/2021 1407   BUN 14 10/26/2019 0903   CREATININE 1.29 (H) 10/04/2021 1407   CREATININE 1.19 11/14/2015 1135      Component Value Date/Time   CALCIUM 9.8 10/04/2021 1407   ALKPHOS 36 (L) 10/04/2021 1407   AST 33 10/04/2021 1407   ALT 41 10/04/2021 1407   BILITOT 0.5 10/04/2021 1407       No results found  for: LABCA2  No components found for: [id number]  No results for input(s): INR in the last 168 hours.  No results found for: LABCA2  No results found for: JQB341  No results found for: PFX902  No results found for: IOX735  No results found for: CA2729  No components found for: HGQUANT  No results found for: CEA1 / No results found for: CEA1   No results found for: AFPTUMOR  No results found for: CHROMOGRNA  No results found for: PSA1  No visits with results within 3 Rabold(s) from this visit.  Latest known visit with results is:  Appointment on 10/04/2021  Component Date Value Ref Range Status   Kappa free light chain 10/04/2021 29.2 (H)  3.3 - 19.4 mg/L Final   Lambda free light chains 10/04/2021 25.9  5.7 - 26.3 mg/L Final   Kappa, lambda light chain ratio 10/04/2021 1.13  0.26 - 1.65 Final   Comment: (NOTE) Performed At: [facility] [place], [place] [id number] Rush Farmer MD [id number]    Sodium 10/04/2021 136  135 - 145 mmol/L Final   Potassium 10/04/2021 4.5  3.5 - 5.1 mmol/L Final   Chloride 10/04/2021 104  98 - 111 mmol/L Final   CO2 10/04/2021 22  22 - 32 mmol/L Final   Glucose, Bld 10/04/2021 134 (H)  70 - 99 mg/dL Final   Glucose reference range applies only to samples taken after fasting for at least 8 hours.   BUN 10/04/2021 20  8 - 23 mg/dL Final   Creatinine 10/04/2021 1.29 (H)  0.61 - 1.24 mg/dL Final   Calcium 10/04/2021 9.8  8.9 - 10.3 mg/dL Final   Total Protein 10/04/2021 8.4 (H)  6.5 - 8.1 g/dL Final   Albumin 10/04/2021 4.4  3.5 - 5.0 g/dL Final   AST 10/04/2021 33  15 - 41 U/L Final   ALT 10/04/2021 41  0 - 44 U/L Final   Alkaline Phosphatase 10/04/2021 36 (L)  38 - 126 U/L Final   Total Bilirubin 10/04/2021 0.5  0.3 - 1.2 mg/dL Final   GFR, Estimated 10/04/2021 >60  >60 mL/min Final   Comment: (NOTE) Calculated using the CKD-EPI Creatinine Equation (2021)    Anion gap 10/04/2021 10  5 - 15 Final   Performed at  [facility], [place] [address] [place], [place] [address]   WBC Count 10/04/2021 5.0  4.0 - 10.5 K/uL Final   RBC 10/04/2021 4.63  4.22 - 5.81 MIL/uL Final   Hemoglobin 10/04/2021 14.0  13.0 - 17.0 g/dL Final   HCT 10/04/2021 40.5  39.0 - 52.0 % Final   MCV 10/04/2021 87.5  80.0 - 100.0 fL Final   MCH 10/04/2021 30.2  26.0 - 34.0 pg Final   MCHC 10/04/2021 34.6  30.0 - 36.0 g/dL Final   RDW 10/04/2021 12.1  11.5 - 15.5 % Final   Platelet Count 10/04/2021 158  150 - 400 K/uL Final   nRBC 10/04/2021 0.0  0.0 - 0.2 % Final   Neutrophils Relative % 10/04/2021 53  % Final   Neutro Abs 10/04/2021 2.7  1.7 - 7.7 K/uL Final   Lymphocytes Relative 10/04/2021 34  % Final   Lymphs Abs 10/04/2021 1.7  0.7 - 4.0 K/uL Final   Monocytes Relative 10/04/2021 11  % Final   Monocytes Absolute 10/04/2021 0.5  0.1 - 1.0 K/uL Final   Eosinophils Relative 10/04/2021 1  % Final   Eosinophils Absolute 10/04/2021 0.1  0.0 - 0.5 K/uL Final   Basophils Relative 10/04/2021 1  % Final   Basophils Absolute 10/04/2021 0.0  0.0 - 0.1 K/uL Final   Immature Granulocytes 10/04/2021 0  % Final   Abs Immature Granulocytes 10/04/2021 0.01  0.00 - 0.07 K/uL Final   Performed at [facility], [place] [address], [place], [place] [address]    (this displays the last labs from the last 3 days)  Lab Results  Component Value Date   TOTALPROTELP 7.8 04/10/2021   ALBUMINELP 3.9 02/22/2020   A1GS 0.2 02/22/2020   A2GS 0.8 02/22/2020   BETS 1.0 02/22/2020   GAMS 1.7 02/22/2020   MSPIKE 1.2 (H) 02/22/2020   (this displays SPEP labs)  Lab Results  Component Value Date   KPAFRELGTCHN 29.2 (H) 10/04/2021   LAMBDASER 25.9 10/04/2021   KAPLAMBRATIO 1.13 10/04/2021   (kappa/lambda light chains)  No results found for: HGBA, HGBA2QUANT, HGBFQUANT, HGBSQUAN (Hemoglobinopathy evaluation)   Lab Results  Component Value Date   LDH 203 (H) 06/14/2020    Lab Results  Component  Value Date   IRON 75 08/06/2020   TIBC 389 08/06/2020   IRONPCTSAT 19 (L) 08/06/2020   (Iron and TIBC)  Lab Results  Component Value Date   FERRITIN 25 04/10/2021    Urinalysis    Component Value Date/Time   COLORURINE YELLOW 05/19/2013 2205   APPEARANCEUR CLEAR 05/19/2013 2205   LABSPEC 1.025 11/30/2020 1146   PHURINE 6.0 11/30/2020 1146   GLUCOSEU NEGATIVE 11/30/2020 1146   HGBUR NEGATIVE 11/30/2020 1146   BILIRUBINUR NEGATIVE 11/30/2020 1146   BILIRUBINUR neg 04/11/2019 1007   KETONESUR NEGATIVE 11/30/2020 1146   PROTEINUR NEGATIVE 11/30/2020 1146   UROBILINOGEN 0.2 11/30/2020 1146   NITRITE NEGATIVE 11/30/2020 1146   LEUKOCYTESUR NEGATIVE 11/30/2020 1146    STUDIES: No results found.   ASSESSMENT: 71 y.o. Jerome Vaughn with  (1) iron deficiency anemia:  (a) colonoscopy 12/07/2009 showed no evidence of malignancy   (b) EGD 02/23/2020 showed gastritis and duodenitis with positive H. Pylori  (c) no improvement in ferritin or iron saturation despite compliant oral iron intake  (d) Feraheme given 03/01/2020 and 03/08/2020  (2) leukopenia: resolved by the time of initial visit 02/22/2020  (a) note lower limit of normal WBC for African Americans is 3.5  (b)  intermittently lower in the past possibly secondary to transient viral infection  (3) Thrombocytopenia: mild, with counts well over 100 K on multiple occasions  (a) abdominal ultrasound on 02/21/2020 shows no spleen enlargement  (b) blood film shows no schistocytes and no artifactual platelet clumping   (c) platelet count in citrate same as in EDTA  (d) chronic, possibly secondary to medications, requiring only follow-up  (4) MGUS:  (a) SPEP 02/22/2020 shows an M spike of 1.2%, IFE confirms IgG lambda clonality  (b) kappa lambda ratio 09/26/2020 was normal at 0.93.  (c) 24 hr urine shows the total protein not to be elevated.  Free lambda light chains were 41 mg/L  (d) beta-2 microglobulin 09/26/2020 was normal  at 1.8   PLAN: Jerome Vaughn is here today for follow up of his MGUS.  He continues to do well today.  He has no new healthcare concerns.  His labs remain stable and he has no symptoms or clinical features consistent with multiple myeloma.  His M spike was pending at the time of our visit, however I reached out to my nurse to f/u with our lab about this delay.    He has h/o iron deficiency and I ordered iron studies to be completed along with Myeloma labs prior to his f/u with Dr. Lorenso Courier in 6 months.  He is not fatigued and his hemoglobin and MCV are normal today.    Jerome Vaughn and I also discussed the fact that Dr. Jana Hakim retired.  He will f/u with Dr. Lorenso Courier with labs one week prior in about 6 months.  We discussed this in detail.  He knows that we are happy to see him between now and his next appointment.    Total encounter time: 20 minutes* in face to face visit time, chart review, lab review, care coordination, and documentation of the encounter.    Wilber Bihari, NP 10/12/21 10:42 AM Medical Oncology and Hematology [facility] [place], [place] [address] Tel. [phone]    Fax. [phone]    *Total Encounter Time as defined by the Centers for Medicare and Medicaid Services includes, in addition to the face-to-face time of a patient visit (documented in the note above) non-face-to-face time: obtaining and reviewing outside history, ordering and reviewing medications, tests or procedures, care coordination (communications with other health care professionals or caregivers) and documentation in the medical record.

## 2021-10-12 ENCOUNTER — Encounter: Payer: Self-pay | Admitting: Oncology

## 2021-10-12 ENCOUNTER — Encounter: Payer: Self-pay | Admitting: Adult Health

## 2021-10-15 ENCOUNTER — Other Ambulatory Visit: Payer: Self-pay | Admitting: Interventional Cardiology

## 2021-10-16 ENCOUNTER — Telehealth: Payer: Self-pay | Admitting: Pharmacist

## 2021-10-16 NOTE — Chronic Care Management (AMB) (Signed)
° ° °  Chronic Care Management Pharmacy Assistant   Name: DEZI SCHANER  MRN: [id number] DOB: [birth year]  Reason for Encounter: Gaps report for Rosuvastatin Rosuvastatin was last filled at [facility] on 08/31/2021 for a 90 Haefner supply.  Gaps report updated    Care Gaps: AWV - message sent to Ramond Craver to schedule HGA1C - 8.0 on 09/06/2021 Last BP - 137/76 on 10/11/2020 Covid vaccine - overdue Tetanus/TDAP - overdue  Eye exam - overdue  Star Rating Drugs: Benazepril 20mg  - last filled 09/04/2021 90DS at [facility]  Metformin 500 mg - last filled 06/14/2021 90DS at [facility]  Repaglinide 0.5 mg - last filled 03/31/2021 90DS at [facility]  Rosuvastatin 40mg  - last filled 08/31/2021 90DS at [facility] Pharmacist Assistant [phone]

## 2021-10-17 ENCOUNTER — Telehealth: Payer: Self-pay | Admitting: Family Medicine

## 2021-10-17 NOTE — Telephone Encounter (Signed)
Left message for patient to call back and schedule Medicare Annual Wellness Visit (AWV) either virtually or in office. Left  my Herbie Drape number [phone]   Last AWV 11/09/20 please schedule at anytime with [facility] Nurse Health Advisor 1 or 2  Awv can be schedule calendar year Mcarthur Rossetti  This should be a 45 minute visit.

## 2021-10-18 ENCOUNTER — Other Ambulatory Visit: Payer: Self-pay | Admitting: Interventional Cardiology

## 2021-10-25 ENCOUNTER — Ambulatory Visit (INDEPENDENT_AMBULATORY_CARE_PROVIDER_SITE_OTHER): Payer: Medicare HMO | Admitting: Endocrinology

## 2021-10-25 ENCOUNTER — Other Ambulatory Visit: Payer: Self-pay

## 2021-10-25 VITALS — BP 140/94 | HR 96 | Ht 67.0 in | Wt 197.6 lb

## 2021-10-25 DIAGNOSIS — E119 Type 2 diabetes mellitus without complications: Secondary | ICD-10-CM | POA: Diagnosis not present

## 2021-10-25 DIAGNOSIS — E118 Type 2 diabetes mellitus with unspecified complications: Secondary | ICD-10-CM

## 2021-10-25 LAB — POCT GLYCOSYLATED HEMOGLOBIN (HGB A1C): Hemoglobin A1C: 8.4 % — AB (ref 4.0–5.6)

## 2021-10-25 MED ORDER — REPAGLINIDE 1 MG PO TABS: 1.0000 mg | ORAL_TABLET | Freq: Two times a day (BID) | ORAL | 3 refills | Status: DC

## 2021-10-25 NOTE — Patient Instructions (Addendum)
check your blood sugar once a Italiano.  vary the time of Arrazola when you check, between before the 3 meals, and at bedtime.  also check if you have symptoms of your blood sugar being too high or too low.  please keep a record of the readings and bring it to your next appointment here (or you can bring the meter itself).  You can write it on any piece of paper.  please call us sooner if your blood sugar goes below 70, or if you have a lot of readings over 200.   I have sent a prescription to your pharmacy, to increase the repaglinide.   Please continue the same other 2 diabetes medications.  Please come back for a follow-up appointment in 2-3 months.

## 2021-10-25 NOTE — Progress Notes (Signed)
Subjective:    Patient ID: Jerome Vaughn, male    DOB: 13-May-1951, 71 y.o.   MRN: [id number]  HPI Pt returns for f/u of diabetes mellitus:  DM type: 2 Dx'ed: 1829 Complications: CAD and renal insuff.   Therapy: 3 oral meds.  DKA: never.  Severe hypoglycemia: never.  Pancreatitis: never.  SDOH: he cannot afford name brand meds Other: he has never been on insulin, but he has learned how; he stopped pioglitizone, due to edema.   Interval history: pt states he feels well in general.  no cbg record, but pt says cbg's vary from 104-300.  He has not recently missed meds.  No recent steroids.   Past Medical History:  Diagnosis Date   Carpal tunnel syndrome    both hands   Colon polyps    Coronary artery disease    a. Cath 11/21/2015: Multivessel CAD --> CABG recommended. b. CABG on 11/22/2015:  LIMA-LAD, SVG-Diag, SVG-OM1 and distal Cx, and SVG-PDA.   Diabetes mellitus    GERD (gastroesophageal reflux disease)    History of echocardiogram    a. Echo 4/17: Moderate LVH, EF 50-55%, mild LAE, no pericardial effusion   Hyperlipidemia    Hypertension    Pneumonia     Past Surgical History:  Procedure Laterality Date   CARDIAC CATHETERIZATION N/A 11/21/2015   Procedure: Left Heart Cath and Coronary Angiography;  Surgeon: [name], MD;  Location: [facility];  Service: Cardiovascular;  Laterality: N/A;   COLONOSCOPY  02/23/2020   per Dr. Tarri Glenn, tubular adenomas, repeat in 3 yrs    colonsocopy  12/07/2009   per Dr. [name], benign polyps, repeat in 10 yrs    CORONARY ARTERY BYPASS GRAFT N/A 11/22/2015   Procedure: CORONARY ARTERY BYPASS GRAFTING (CABG) times five using the left internal mammary, right greater saphenous vein EVH, and left thigh greater saphenous vein EVH;  Surgeon: [name], MD;  Location: [facility];  Service: Open Heart Surgery;  Laterality: N/A;   frozen shoulder release Right    INTRAVASCULAR PRESSURE WIRE/FFR STUDY N/A 12/14/2018   Procedure: INTRAVASCULAR  PRESSURE WIRE/FFR STUDY;  Surgeon: Jettie Booze, MD;  Location: [facility];  Service: Cardiovascular;  Laterality: N/A;   LEFT HEART CATH AND CORS/GRAFTS ANGIOGRAPHY N/A 10/27/2017   Procedure: LEFT HEART CATH AND CORS/GRAFTS ANGIOGRAPHY;  Surgeon: [name], MD;  Location: [facility];  Service: Cardiovascular;  Laterality: N/A;   LEFT HEART CATH AND CORS/GRAFTS ANGIOGRAPHY N/A 12/14/2018   Procedure: LEFT HEART CATH AND CORS/GRAFTS ANGIOGRAPHY;  Surgeon: Jettie Booze, MD;  Location: [facility];  Service: Cardiovascular;  Laterality: N/A;   TEE WITHOUT CARDIOVERSION N/A 11/22/2015   Procedure: TRANSESOPHAGEAL ECHOCARDIOGRAM (TEE);  Surgeon: [name], MD;  Location: [facility];  Service: Open Heart Surgery;  Laterality: N/A;   WRIST GANGLION EXCISION Right     Social History   Socioeconomic History   Marital status: Married    Spouse name: Not on file   Number of children: 5   Years of education: Not on file   Highest education level: Not on file  Occupational History   Occupation: Dispatcher  Tobacco Use   Smoking status: Former    Packs/Barham: 0.25    Years: 20.00    Pack years: 5.00    Types: Cigarettes    Quit date: 10/07/2013    Years since quitting: 8.0   Smokeless tobacco: Never   Tobacco comments:  quit cigarettes, might have a cigar 1 X month  Vaping Use   Vaping Use: Never used  Substance and Sexual Activity   Alcohol use: Yes    Alcohol/week: 3.0 - 4.0 standard drinks    Types: 3 - 4 Standard drinks or equivalent per week   Drug use: No   Sexual activity: Not Currently    Birth control/protection: None  Other Topics Concern   Not on file  Social History Narrative   Originally from [place], [place]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)   Married   5 kids   6 grand, 2 great grand   Social Determinants of Health   Financial Resource Strain: Low Risk    Difficulty of Paying Living Expenses:  Not hard at all  Food Insecurity: No Food Insecurity   Worried About Charity fundraiser in the Last Year: Never true   Arboriculturist in the Last Year: Never true  Transportation Needs: No Transportation Needs   Lack of Transportation (Medical): No   Lack of Transportation (Non-Medical): No  Physical Activity: Inactive   Days of Exercise per Week: 0 days   Minutes of Exercise per Session: 0 min  Stress: No Stress Concern Present   Feeling of Stress : Not at all  Social Connections: Socially Integrated   Frequency of Communication with Friends and Family: More than three times a week   Frequency of Social Gatherings with Friends and Family: More than three times a week   Attends Religious Services: More than 4 times per year   Active Member of [employer] or Organizations: Yes   Attends Music therapist: More than 4 times per year   Marital Status: Married  Human resources officer Violence: Not At Risk   Fear of Current or Ex-Partner: No   Emotionally Abused: No   Physically Abused: No   Sexually Abused: No    Current Outpatient Medications on File Prior to Visit  Medication Sig Dispense Refill   Accu-Chek Softclix Lancets lancets 1 each by Other route daily. Use to monitor glucose levels daily; E11.8 100 each 2   acetaminophen (TYLENOL) 500 MG tablet Take 500 mg by mouth as needed.     APPLE CIDER VINEGAR PO Take by mouth daily.      Ascorbic Acid (VITAMIN C PO) Take 1 tablet by mouth daily.     aspirin EC 81 MG tablet Take 1 tablet (81 mg total) by mouth daily.     benazepril (LOTENSIN) 20 MG tablet TAKE 1 TABLET EVERY [place] 90 tablet 3   Blood Glucose Monitoring Suppl (ACCU-CHEK AVIVA PLUS) w/Device KIT 1 each by Does not apply route daily. Use to monitor glucose levels daily; E11.8 1 kit 0   bromocriptine (PARLODEL) 2.5 MG tablet Take 1 tablet (2.5 mg total) by mouth daily. 90 tablet 3   carvedilol (COREG) 3.125 MG tablet Take 1 tablet (3.125 mg total) by mouth 2 (two) times  daily with a meal. 180 tablet 3   ciclopirox (PENLAC) 8 % solution Apply topically at bedtime. Apply over nail and surrounding skin. Apply daily over previous coat. After seven (7) days, may remove with alcohol and continue cycle. 6.6 mL 0   Cinnamon 500 MG capsule Take 1 capsule by mouth daily.     Coenzyme Q10 (COQ-10) 100 MG CAPS Take 1 tablet by mouth daily.     Cyanocobalamin (VITAMIN B-12 PO) Take 1 tablet by mouth daily.  ezetimibe (ZETIA) 10 MG tablet Take 1 tablet (10 mg total) by mouth daily. 90 tablet 3   ferrous sulfate 325 (65 FE) MG tablet Take 325 mg by mouth daily with breakfast.     Garlic 9147 MG CAPS Take 1 tablet by mouth daily.     glucose blood (ACCU-CHEK AVIVA PLUS) test strip 1 each by Other route daily. Use to monitor glucose levels daily; E11.8 100 each 2   metFORMIN (GLUCOPHAGE-XR) 500 MG 24 hr tablet Take 4 tablets (2,000 mg total) by mouth daily. 360 tablet 3   Multiple Vitamin (MULTIVITAMIN) tablet Take 1 tablet by mouth daily.     nitroGLYCERIN (NITROSTAT) 0.4 MG SL tablet Place 1 tablet (0.4 mg total) under the tongue every 5 (five) minutes as needed for chest pain. 75 tablet 1   Omega-3 Fatty Acids (FISH OIL PO) Take 1 capsule by mouth daily.     pantoprazole (PROTONIX) 40 MG tablet Take 1 tablet (40 mg total) by mouth 2 (two) times daily. 180 tablet 3   ranolazine (RANEXA) 500 MG 12 hr tablet Take 1 tablet (500 mg total) by mouth 2 (two) times daily. 180 tablet 0   rosuvastatin (CRESTOR) 40 MG tablet TAKE 1 TABLET EVERY [place] 90 tablet 1   tadalafil (CIALIS) 20 MG tablet Take 1 tablet (20 mg total) by mouth as needed for erectile dysfunction. 10 tablet 11   VITAMIN E PO Take 1 capsule by mouth daily.     No current facility-administered medications on file prior to visit.    No Known Allergies  Family History  Problem Relation Age of Onset   Heart failure Father    Anuerysm Brother        AAA   Hyperlipidemia Sister    Diabetes Sister    Hypertension  Sister    Kidney failure Sister    Diabetes Maternal Grandmother    Hyperlipidemia Sister    Diabetes Sister    Colon polyps Sister    Kidney disease Sister    Other Brother        COVID 19   Diabetes Son    Heart disease Son    Heart attack Neg Hx    Colon cancer Neg Hx    Stomach cancer Neg Hx    Esophageal cancer Neg Hx     BP (!) 140/94    Pulse 96    Ht _0  (1.702 m)    Wt 197 lb 9.6 oz (89.6 kg)    SpO2 97%    BMI 30.95 kg/m    Review of Systems He denies hypoglycemia    Objective:   Physical Exam    Lab Results  Component Value Date   HGBA1C 8.0 (H) 09/06/2021      Assessment & Plan:  Type 2 DM: uncontrolled  Patient Instructions  check your blood sugar once a [place].  vary the time of [place] when you check, between before the 3 meals, and at bedtime.  also check if you have symptoms of your blood sugar being too high or too low.  please keep a record of the readings and bring it to your next appointment here (or you can bring the meter itself).  You can write it on any piece of paper.  please call us sooner if your blood sugar goes below 70, or if you have a lot of readings over 200.   I have sent a prescription to your pharmacy, to increase the repaglinide.   Please  continue the same other 2 diabetes medications.  Please come back for a follow-up appointment in 2-3 months.

## 2021-11-06 ENCOUNTER — Telehealth: Payer: Self-pay | Admitting: Pharmacist

## 2021-11-06 NOTE — Chronic Care Management (AMB) (Signed)
° ° °  Chronic Care Management Pharmacy Assistant   Name: Jerome Vaughn  MRN: [id number] DOB: [birth year]  11/08/2021 APPOINTMENT REMINDER   Called [facility], No answer, left message of appointment on 11/08/2021 at 10:00 via telephone visit with Jeni Salles, Pharm D. Notified to have all medications, supplements, blood pressure and/or blood sugar logs available during appointment and to return call if need to reschedule.  Care Gaps: AWV - previous message sent to Ramond Craver  [facility] - 8.0 on 09/06/2021 Last BP - 140/94 on 10/25/2021 Covid vaccine - overdue Tetanus/TDAP - overdue  Eye exam - overdue  Star Rating Drug: Benazepril 20mg  - last filled 09/04/2021 [facility] at [facility]  Metformin 500 mg - last filled 06/14/2021 90DS at [facility] verified with [place] medication filled and never picked up.  Repaglinide 0.5 mg - last filled 10/28/2021 90[facility] at [facility] verified with [place]  Rosuvastatin 40mg  - last filled 08/31/2021 90[facility] at [facility]  Any gaps in medications fill history? Yes  [facility] Pharmacist Assistant [phone]

## 2021-11-08 ENCOUNTER — Other Ambulatory Visit: Payer: Self-pay | Admitting: Endocrinology

## 2021-11-08 ENCOUNTER — Telehealth: Payer: Self-pay | Admitting: *Deleted

## 2021-11-08 ENCOUNTER — Ambulatory Visit (INDEPENDENT_AMBULATORY_CARE_PROVIDER_SITE_OTHER): Payer: Medicare HMO | Admitting: Pharmacist

## 2021-11-08 DIAGNOSIS — E118 Type 2 diabetes mellitus with unspecified complications: Secondary | ICD-10-CM

## 2021-11-08 DIAGNOSIS — I10 Essential (primary) hypertension: Secondary | ICD-10-CM

## 2021-11-08 MED ORDER — METFORMIN HCL ER 500 MG PO TB24: 2000.0000 mg | ORAL_TABLET | Freq: Every day | ORAL | 3 refills | Status: DC

## 2021-11-08 MED ORDER — RANOLAZINE ER 500 MG PO TB12: 500.0000 mg | ORAL_TABLET | Freq: Two times a day (BID) | ORAL | 3 refills | Status: DC

## 2021-11-08 NOTE — Telephone Encounter (Signed)
-----   Message from Viona Gilmore, [facility] sent at 11/08/2021 10:58 AM EST ----- Regarding: Ranolazine refill Hi,  Can you please send a refill of ranolazine to [facility] for Mr. Coolman? This will be much cheaper for him than going through his usual pharmacy. I added the pharmacy to his profile so you should be able to select it when you are sending it? It's located in [place] (state) if that helps.   Also please include his email: [email or web address] in the notes to pharmacy section! This is a very important step!!!!  Thank you, Maddie

## 2021-11-08 NOTE — Patient Instructions (Signed)
Hi Jerome Vaughn,  It was great to get to meet you over the telephone! Below is a summary of some of the topics we discussed.   Please try to get in the 4 tablets of metformin every [place] as we discussed. If you need to take 2 with lunch and 2 with dinner, that may be helpful to get everything in.  Please reach out to me if you have any questions or need anything!  Best, Maddie  Jeni Salles, PharmD, [facility]   Visit Information   Goals Addressed   None    Patient Care Plan: [facility] Pharmacy Care Plan     Problem Identified: Problem: Hypertension, Hyperlipidemia, Diabetes, Heart Failure, Coronary Artery Disease, GERD, and Tobacco use      Long-Range Goal: Patient-Specific Goal   Start Date: 11/08/2021  Expected End Date: 11/08/2022  This Visit's Progress: On track  Priority: High  Note:   Current Barriers:  Unable to independently afford treatment regimen Unable to maintain control of diabetes Suboptimal therapeutic regimen for diabetes  Pharmacist Clinical Goal(s):  Patient will verbalize ability to afford treatment regimen achieve adherence to monitoring guidelines and medication adherence to achieve therapeutic efficacy maintain control of diabetes as evidenced by A1c  through collaboration with PharmD and provider.   Interventions: 1:1 collaboration with [name], MD regarding development and update of comprehensive plan of care as evidenced by provider attestation and co-signature Inter-disciplinary care team collaboration (see longitudinal plan of care) Comprehensive medication review performed; medication list updated in electronic medical record  Hypertension (BP goal <130/80) -Not ideally controlled -Current treatment: Benazepril 20 mg 1 tablet daily - Appropriate, Query effective, Safe, Accessible Carvedilol 3.125 mg 1 tablet twice daily - Appropriate, Query effective, Safe, Accessible -Medications  previously tried: n/a  -Current home readings: has a wrist cuff - does not check often -Current dietary habits: limits salt intake -Current exercise habits: active with work and walking after work -Denies hypotensive/hypertensive symptoms -Educated on BP goals and benefits of medications for prevention of heart attack, stroke and kidney damage; Importance of home blood pressure monitoring; Proper BP monitoring technique; -Counseled to monitor BP at home weekly, document, and provide log at future appointments -Counseled on diet and exercise extensively Recommended to continue current medication  Hyperlipidemia: (LDL goal < 55) -Controlled -Current treatment: Ezetimibe 10 mg 1 tablet daily - Appropriate, Effective, Safe, Accessible Rosuvastatin 40 mg 1 tablet daily  - Appropriate, Effective, Safe, Accessible -Medications previously tried: none  -Current dietary patterns: did not discuss -Current exercise habits: active at work and walking after work -Educated on [employer];  Benefits of statin for ASCVD risk reduction; Importance of limiting foods high in cholesterol; -Counseled on diet and exercise extensively Recommended to continue current medication  Diabetes (A1c goal <7%) -Uncontrolled -Current medications: Bromocriptine 2.5 mg 1 tablet daily - Query Appropriate, Query effective, Safe, Query accessible Repaglinide 1 mg 1 tablet twice daily before a meal - Query Appropriate, Query effective, Safe, Accessible Metformin XR 500 mg 4 tablets daily (not always taking 4 per [place]) - Appropriate, Query effective, Safe, Accessible -Medications previously tried: Geneticist, molecular (cost), pioglitazone (edema), semaglutide (insurance formulary), Januvia (unknown), Farxiga (insurance changed to [place]) -Current home glucose readings fasting glucose: 140 (average) post prandial glucose: 160 before bedtime (average) -Denies hypoglycemic/hyperglycemic symptoms -Current meal patterns:   breakfast: light breakfast (5am) - doesn't skip because of repaglinide; boiled egg and toast and juice; morning star instead of bacon and sausage lunch: noon; more  fruits and vegetables with diet dinner: 6 pm; has cut back on bread and eating chicken and fish; salads more snacks: has cut back on heavy foods drinks: no soda; juice  & water, coffee (splenda) & tea (splenda zero) -Current exercise: walking after work and 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  CAD (Goal: prevent heart events) -Controlled -Current treatment  Aspirin 81 mg 1 tablet daily - Appropriate, Effective, Safe, Accessible Ranolazine 500 mg 1 tablet twice daily - Appropriate, Effective, Safe, Query accessible -Medications previously tried: n/a  -Collaborated with cardiology to send ranolazine to [facility] for lower price.  GERD (Goal: minimize symptoms) -Controlled -Current treatment  Pantoprazole 40 mg 1 tablet daily - Appropriate, Effective, Safe, Accessible -Medications previously tried: none  - Reassess the need at follow up.  Health Maintenance -Vaccine gaps: none -Current therapy:  Multivitamin daily Garlic 7253 mg 1 capsule daily Vitamin C daily Ciclopirox apply as directed Cinnamon 500 mg 1 capsule daily CoQ10 100 mg 1 capsule daily Vitamin B12 daily Ferrous sulfate 325 mg 1 tablet daily Garlic 6644 mg 1 capsule daily Tadalafil 20 mg 1 tablet daily Vitamin E daily -Educated on Herbal supplement research is limited and benefits usually cannot be proven Cost vs benefit of  each product must be carefully weighed by individual consumer Supplements may interfere with prescription drugs -Patient is satisfied with current therapy and denies issues - Reassess supplements at follow up.  Patient Goals/Self-Care Activities Patient will:  - check glucose twice daily, document, and provide at future appointments check blood pressure weekly, document, and provide at future appointments target a minimum of 150 minutes of moderate intensity exercise weekly  Follow Up Plan: The care management team will reach out to the patient again over the next 30 days.        Patient verbalizes understanding of instructions and care plan provided today and agrees to view in [place] [facility] [place]. Active MyChart status confirmed with patient.   The pharmacy team will reach out to the patient again over the next 30 days.   Viona Gilmore, [facility]

## 2021-11-08 NOTE — Progress Notes (Signed)
Chronic Care Management Pharmacy Note  11/08/2021 Name:  Jerome Vaughn MRN:  [id number] DOB:  01/31/1951  Summary: A1c not at goal < 7% LDL at goal < 55 BP not quite at goal < 130/80  Recommendations/Changes made from today's visit: -Recommended taking metformin 2 tablets with lunch and 2 tablets with dinner to avoid possible lows in morning -Requested refill of ranolazine to [place] to cut down on cost -Recommended routine BP monitoring and bringing cuff to next office visit to ensure accuracy  Plan: DM assessment in 2-3 months CCM follow up in 6 months   Subjective: Jerome Vaughn is an 71 y.o. year old male who is a primary patient of [name], MD.  The CCM team was consulted for assistance with disease management and care coordination needs.    Engaged with patient by telephone for follow up visit in response to provider referral for pharmacy case management and/or care coordination services.   Consent to Services:  The patient was given information about Chronic Care Management services, agreed to services, and gave verbal consent prior to initiation of services.  Please see initial visit note for detailed documentation.   Patient Care Team: [name], MD as PCP - General (Family Medicine) [name], MD as PCP - Cardiology (Cardiology) Thornton Park, MD as Consulting Physician (Gastroenterology) [name], MD as Consulting Physician (Endocrinology) Orson Slick, MD as Consulting Physician (Hematology and Oncology) Viona Gilmore, [facility] as Pharmacist (Pharmacist)  Recent office visits: 08/30/2021 Alysia Penna MD - Patient was seen for annual exam. No medication changes. No follow up noted.  Recent consult visits: 10/25/21 [name], MD (endo): Patient presented for DM follow up. Increased repaglinide to 1 mg BID.  10/11/21 Wilber Bihari, NP (oncology): Patient presented for MGUS follow up.  10/04/21 [name], DPM (podiatry):  Patient presented for diabetic nail trim.  08/05/2021 Daneen Schick MD (cardiology) - Patient was seen for coronary artery disease involving native coronary artery of native heart without angina pectoris and additional issues. Discontinued Albuterol. Follow up in 1 year.  Hospital visits: None in previous 6 months   Objective:  Lab Results  Component Value Date   CREATININE 1.29 (H) 10/04/2021   BUN 20 10/04/2021   GFR 53.15 (L) 09/06/2021   GFRNONAA >60 10/04/2021   GFRAA >60 06/14/2020   NA 136 10/04/2021   K 4.5 10/04/2021   CALCIUM 9.8 10/04/2021   CO2 22 10/04/2021   GLUCOSE 134 (H) 10/04/2021    Lab Results  Component Value Date/Time   HGBA1C 8.4 (A) 10/25/2021 08:28 AM   HGBA1C 8.0 (H) 09/06/2021 09:15 AM   HGBA1C 6.9 (A) 06/14/2021 08:21 AM   HGBA1C 7.6 (H) 11/21/2015 06:20 PM   GFR 53.15 (L) 09/06/2021 09:15 AM   GFR 81.97 04/11/2019 09:24 AM   MICROALBUR 1.2 08/22/2014 10:34 AM   MICROALBUR 1.6 03/28/2011 11:41 AM    Last diabetic Eye exam:  Lab Results  Component Value Date/Time   HMDIABEYEEXA No Retinopathy 09/18/2020 12:00 AM    Last diabetic Foot exam: No results found for: HMDIABFOOTEX   Lab Results  Component Value Date   CHOL 131 09/06/2021   HDL 54.70 09/06/2021   LDLCALC 53 09/06/2021   LDLDIRECT 204.8 09/05/2013   TRIG 114.0 09/06/2021   CHOLHDL 2 09/06/2021    Hepatic Function Latest Ref Rng & Units 10/04/2021 09/06/2021 04/10/2021  Total Protein 6.5 - 8.1 g/dL 8.4(H) 7.6 8.2(H)  Albumin 3.5 -  5.0 g/dL 4.4 4.3 3.8  AST 15 - 41 U/L 33 25 17  ALT 0 - 44 U/L 41 33 17  Alk Phosphatase 38 - 126 U/L 36(L) 32(L) 33(L)  Total Bilirubin 0.3 - 1.2 mg/dL 0.5 0.5 0.4  Bilirubin, Direct 0.0 - 0.3 mg/dL - 0.1 -    Lab Results  Component Value Date/Time   TSH 0.78 09/06/2021 09:15 AM   TSH 0.55 08/07/2020 09:06 AM    CBC Latest Ref Rng & Units 10/04/2021 09/06/2021 04/10/2021  WBC 4.0 - 10.5 K/uL 5.0 3.7(L) 3.3(L)  Hemoglobin 13.0 - 17.0 g/dL 14.0  13.2 13.2  Hematocrit 39.0 - 52.0 % 40.5 39.5 37.5(L)  Platelets 150 - 400 K/uL 158 148.0(L) 152    No results found for: VD25OH  Clinical ASCVD: Yes  The 10-year ASCVD risk score (Arnett DK, et al., 2019) is: 31.6%   Values used to calculate the score:     Age: 31 years     Sex: Male     Is Non-Hispanic African American: Yes     Diabetic: Yes     Tobacco smoker: No     Systolic Blood Pressure: 244 mmHg     Is BP treated: Yes     HDL Cholesterol: 54.7 mg/dL     Total Cholesterol: 131 mg/dL    Depression screen [facility] 2/9 08/30/2021 11/09/2020 08/02/2020  Decreased Interest 0 0 0  Down, Depressed, Hopeless 0 0 0  PHQ - 2 Score 0 0 0  Altered sleeping 0 - 0  Tired, decreased energy 0 - 0  Change in appetite 0 - 0  Feeling bad or failure about yourself  0 - 0  Trouble concentrating 0 - 0  Moving slowly or fidgety/restless 0 - 0  Suicidal thoughts 0 - 0  PHQ-9 Score 0 - 0  Difficult doing work/chores Not difficult at all - -  Some recent data might be hidden     Social History   Tobacco Use  Smoking Status Former   Packs/Kemmer: 0.25   Years: 20.00   Pack years: 5.00   Types: Cigarettes   Quit date: 10/07/2013   Years since quitting: 8.0  Smokeless Tobacco Never  Tobacco Comments   quit cigarettes, might have a cigar 1 X month   BP Readings from Last 3 Encounters:  10/25/21 (!) 140/94  10/11/21 137/76  08/30/21 118/80   Pulse Readings from Last 3 Encounters:  10/25/21 96  10/11/21 96  08/30/21 87   Wt Readings from Last 3 Encounters:  10/25/21 197 lb 9.6 oz (89.6 kg)  10/11/21 194 lb 8 oz (88.2 kg)  08/30/21 196 lb (88.9 kg)   BMI Readings from Last 3 Encounters:  10/25/21 30.95 kg/m  10/11/21 30.46 kg/m  08/30/21 30.70 kg/m    Assessment/Interventions: Review of patient past medical history, allergies, medications, health status, including review of consultants reports, laboratory and other test data, was performed as part of comprehensive evaluation and  provision of chronic care management services.   SDOH:  (Social Determinants of Health) assessments and interventions performed: No  SDOH Screenings   Alcohol Screen: Low Risk    Last Alcohol Screening Score (AUDIT): 3  Depression (PHQ2-9): Low Risk    PHQ-2 Score: 0  Financial Resource Strain: Low Risk    Difficulty of Paying Living Expenses: Not hard at all  Food Insecurity: No Food Insecurity   Worried About Charity fundraiser in the Last Year: Never true   Ran Out of  Food in the Last Year: Never true  Housing: Low Risk    Last Housing Risk Score: 0  Physical Activity: Inactive   Days of Exercise per Week: 0 days   Minutes of Exercise per Session: 0 min  Social Connections: Socially Integrated   Frequency of Communication with Friends and Family: More than three times a week   Frequency of Social Gatherings with Friends and Family: More than three times a week   Attends Religious Services: More than 4 times per year   Active Member of [employer] or Organizations: Yes   Attends Music therapist: More than 4 times per year   Marital Status: Married  Stress: No Stress Concern Present   Feeling of Stress : Not at all  Tobacco Use: Medium Risk   Smoking Tobacco Use: Former   Smokeless Tobacco Use: Never   Passive Exposure: Not on Pensions consultant Needs: No Transportation Needs   Lack of Transportation (Medical): No   Lack of Transportation (Non-Medical): No    CCM Care Plan  No Known Allergies  Medications Reviewed Today     Reviewed by Casandra Doffing, CMA (Certified Medical Assistant) on 10/25/21 at [place] List Status: <None>   Medication Order Taking? Sig Documenting Provider Last Dose Status Informant  Accu-Chek Softclix Lancets lancets [id number] Yes 1 each by Other route daily. Use to monitor glucose levels daily; E11.8 [name], MD Taking Active   acetaminophen (TYLENOL) 500 MG tablet [id number] Yes Take 500 mg by mouth as needed. [provider] Taking Active   APPLE CIDER VINEGAR PO [id number] Yes Take by mouth daily.  [provider] Taking Active   Ascorbic Acid (VITAMIN C PO) [id number] Yes Take 1 tablet by mouth daily. [provider] Taking Active Self  aspirin EC 81 MG tablet [id number] Yes Take 1 tablet (81 mg total) by mouth daily. Burtis Junes, NP Taking Active Self           Med Note Rosana Hoes, SOPHIA A   Tue Jan 24, 2020 11:44 AM)    benazepril (LOTENSIN) 20 MG tablet [id number] Yes TAKE 1 TABLET EVERY [place] [name], MD Taking Active   Blood Glucose Monitoring Suppl (ACCU-CHEK AVIVA PLUS) w/Device KIT [id number] Yes 1 each by Does not apply route daily. Use to monitor glucose levels daily; E11.8 [name], MD Taking Active   bromocriptine (PARLODEL) 2.5 MG tablet [id number] Yes Take 1 tablet (2.5 mg total) by mouth daily. [name], MD Taking Active   carvedilol (COREG) 3.125 MG tablet [id number] Yes Take 1 tablet (3.125 mg total) by mouth 2 (two) times daily with a meal. [name], MD Taking Active   ciclopirox [facility]) 8 % solution [id number] Yes Apply topically at bedtime. Apply over nail and surrounding skin. Apply daily over previous coat. After seven (7) days, may remove with alcohol and continue cycle. Felipa Furnace, DPM Taking Active   Cinnamon 500 MG capsule [id number] Yes Take 1 capsule by mouth daily. [provider] Taking Active   Coenzyme Q10 (COQ-10) 100 MG CAPS [id number] Yes Take 1 tablet by mouth daily. [provider] Taking Active   Cyanocobalamin (VITAMIN B-12 PO) [id number] Yes Take 1 tablet by mouth daily. [provider] Taking Active Self  ezetimibe (ZETIA) 10 MG tablet [id number] Yes Take 1 tablet (10 mg total) by mouth daily. [name], MD Taking Active   ferrous sulfate 325 (65 FE) MG tablet [id number] Yes Take 325 mg  by mouth daily with breakfast. [provider] Taking Active Self  Garlic 1062 MG CAPS [id number] Yes Take  1 tablet by mouth daily. [provider] Taking Active   glucose blood (ACCU-CHEK AVIVA PLUS) test strip [id number] Yes 1 each by Other route daily. Use to monitor glucose levels daily; E11.8 [name], MD Taking Active   metFORMIN (GLUCOPHAGE-XR) 500 MG 24 hr tablet [id number] Yes Take 4 tablets (2,000 mg total) by mouth daily. [name], MD Taking Active   Multiple Vitamin (MULTIVITAMIN) tablet [id number] Yes Take 1 tablet by mouth daily. [provider] Taking Active Self  nitroGLYCERIN (NITROSTAT) 0.4 MG SL tablet [id number] Yes Place 1 tablet (0.4 mg total) under the tongue every 5 (five) minutes as needed for chest pain. [name], MD Taking Active            Med Note Louretta Shorten, Emmaline Kluver   Thu Jan 26, 2020  8:14 AM)    Omega-3 Fatty Acids (FISH OIL PO) [id number] Yes Take 1 capsule by mouth daily. [provider] Taking Active   pantoprazole (PROTONIX) 40 MG tablet [id number] Yes Take 1 tablet (40 mg total) by mouth 2 (two) times daily. Thornton Park, MD Taking Active   ranolazine (RANEXA) 500 MG 12 hr tablet [id number] Yes Take 1 tablet (500 mg total) by mouth 2 (two) times daily. [name], MD Taking Active   repaglinide (PRANDIN) 0.5 MG tablet [id number] Yes Take 1 tablet (0.5 mg total) by mouth 2 (two) times daily before a meal. [name], MD Taking Active   rosuvastatin (CRESTOR) 40 MG tablet [id number] Yes TAKE 1 TABLET EVERY [place] [name], MD Taking Active   tadalafil (CIALIS) 20 MG tablet [id number] Yes Take 1 tablet (20 mg total) by mouth as needed for erectile dysfunction. [name], MD Taking Active   VITAMIN E PO [id number] Yes Take 1 capsule by mouth daily. [provider] Taking Active             Patient Active Problem List   Diagnosis Date Noted   GERD (gastroesophageal reflux disease)    Bacterial infection due to H. 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   Abnormal stress test 11/21/2015   Coronary artery disease involving coronary bypass graft of native heart with angina pectoris (HCC)    Nonspecific abnormal electrocardiogram (ECG) (EKG) 11/07/2015   Hypertension 03/30/2011   Hyperlipidemia 03/30/2011   Tobacco use disorder 03/30/2011   Colon polyp 03/30/2011    Immunization History  Administered Date(s) Administered   Fluad Quad(high Dose 65+) 08/02/2020, 08/30/2021   Influenza, High Dose Seasonal PF 05/30/2019   Influenza,inj,Quad PF,6+ Mos 08/22/2014, 12/03/2016, 11/23/2017   Influenza-Unspecified 08/29/2013, 07/30/2015, 08/02/2016, 10/04/2018, 10/07/2018, 05/12/2019   PFIZER(Purple Top)SARS-COV-2 Vaccination 11/11/2019, 12/05/2019, 06/28/2020, 02/05/2021   Pfizer Covid-19 Vaccine Bivalent Booster 8yr & up 06/21/2021   Pneumococcal Conjugate-13 08/27/2015   Pneumococcal Polysaccharide-23 02/09/2017   Td (Adult),unspecified 11/03/2021   Tdap 03/28/2011   Zoster Recombinat (Shingrix) 11/26/2018, 05/31/2019   Patient was concerned  about his elevated A1c from his last visit with endo. His endocrinologist thought it was due to age that his A1c increased. Patient also ran out of bromocriptine and didn't realize it. He struggles with the cost of this medication and with ranolazine. Patient tried to challenge the tier level for bromocriptine and ranolazine and they were denied by [facility].  He has been getting ranolazine through [facility] due to cost.  He does not qualify for patient  assistance for any medications because him and his wife continue to work full time and their income is above the limits.  Conditions to be addressed/monitored:  Hypertension, Hyperlipidemia, Diabetes, Heart Failure, Coronary Artery Disease, GERD, and Tobacco use  Conditions addressed this visit: Hypertension, Diabetes, CAD  Care Plan : CCM Pharmacy Care Plan  Updates made by Viona Gilmore, [place] since 11/08/2021 12:00 AM     Problem: Problem: Hypertension, Hyperlipidemia, Diabetes, Heart Failure, Coronary Artery Disease, GERD, and Tobacco use      Long-Range Goal: Patient-Specific Goal   Start Date: 11/08/2021  Expected End Date: 11/08/2022  This Visit's Progress: On track  Priority: High  Note:   Current Barriers:  Unable to independently afford treatment regimen Unable to maintain control of diabetes Suboptimal therapeutic regimen for diabetes  Pharmacist Clinical Goal(s):  Patient will verbalize ability to afford treatment regimen achieve adherence to monitoring guidelines and medication adherence to achieve therapeutic efficacy maintain control of diabetes as evidenced by A1c  through collaboration with PharmD and provider.   Interventions: 1:1 collaboration with [name], MD regarding development and update of comprehensive plan of care as evidenced by provider attestation and co-signature Inter-disciplinary care team collaboration (see longitudinal plan of care) Comprehensive medication review performed; medication list updated in electronic medical record  Hypertension (BP goal <130/80) -Not ideally controlled -Current treatment: Benazepril 20 mg 1 tablet daily - Appropriate, Query effective, Safe, Accessible Carvedilol 3.125 mg 1 tablet twice daily - Appropriate, Query effective, Safe, Accessible -Medications previously tried: n/a  -Current home readings: has a wrist cuff - does not check often -Current dietary habits: limits salt intake -Current exercise  habits: active with work and walking after work -Denies hypotensive/hypertensive symptoms -Educated on BP goals and benefits of medications for prevention of heart attack, stroke and kidney damage; Importance of home blood pressure monitoring; Proper BP monitoring technique; -Counseled to monitor BP at home weekly, document, and provide log at future appointments -Counseled on diet and exercise extensively Recommended to continue current medication  Hyperlipidemia: (LDL goal < 55) -Controlled -Current treatment: Ezetimibe 10 mg 1 tablet daily - Appropriate, Effective, Safe, Accessible Rosuvastatin 40 mg 1 tablet daily  - Appropriate, Effective, Safe, Accessible -Medications previously tried: none  -Current dietary patterns: did not discuss -Current exercise habits: active at work and walking after work -Educated on [employer];  Benefits of statin for ASCVD risk reduction; Importance of limiting foods high in cholesterol; -Counseled on diet and exercise extensively Recommended to continue current medication  Diabetes (A1c goal <7%) -Uncontrolled -Current medications: Bromocriptine 2.5 mg 1 tablet daily - Query Appropriate, Query effective, Safe, Query accessible Repaglinide 1 mg 1 tablet twice daily before a meal - Query Appropriate, Query effective, Safe, Accessible Metformin XR 500 mg 4 tablets daily (not always taking 4 per [place]) - Appropriate, Query effective, Safe, Accessible -Medications previously tried: Geneticist, molecular (cost), pioglitazone (edema), semaglutide (insurance formulary), Januvia (unknown), Farxiga (insurance changed to [place]) -Current home glucose readings fasting glucose: 140 (average) post prandial glucose: 160 before bedtime (average) -  Denies hypoglycemic/hyperglycemic symptoms -Current meal patterns:  breakfast: light breakfast (5am) - doesn't skip because of repaglinide; boiled egg and toast and juice; morning star instead of bacon and sausage lunch:  noon; more fruits and vegetables with diet dinner: 6 pm; has cut back on bread and eating chicken and fish; salads more snacks: has cut back on heavy foods drinks: no soda; juice  & water, coffee (splenda) & tea (splenda zero) -Current exercise: walking after work and 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  CAD (Goal: prevent heart events) -Controlled -Current treatment  Aspirin 81 mg 1 tablet daily - Appropriate, Effective, Safe, Accessible Ranolazine 500 mg 1 tablet twice daily - Appropriate, Effective, Safe, Query accessible -Medications previously tried: n/a  -Collaborated with cardiology to send ranolazine to [facility] for lower price.  GERD (Goal: minimize symptoms) -Controlled -Current treatment  Pantoprazole 40 mg 1 tablet daily - Appropriate, Effective, Safe, Accessible -Medications previously tried: none  - Reassess the need at follow up.  Health Maintenance -Vaccine gaps: none -Current therapy:  Multivitamin daily Garlic 9390 mg 1 capsule daily Vitamin C daily Ciclopirox apply as directed Cinnamon 500 mg 1 capsule daily CoQ10 100 mg 1 capsule daily Vitamin B12 daily Ferrous sulfate 325 mg 1 tablet daily Garlic 3009 mg 1 capsule daily Tadalafil 20 mg 1 tablet daily Vitamin E daily -Educated on Herbal supplement research is limited and benefits usually cannot be proven Cost vs benefit of each product must be carefully weighed by individual consumer Supplements may interfere with prescription drugs -Patient is satisfied with current therapy  and denies issues - Reassess supplements at follow up.  Patient Goals/Self-Care Activities Patient will:  - check glucose twice daily, document, and provide at future appointments check blood pressure weekly, document, and provide at future appointments target a minimum of 150 minutes of moderate intensity exercise weekly  Follow Up Plan: The care management team will reach out to the patient again over the next 30 days.        Medication Assistance:  Does not qualify for patient assistance at this time.  Compliance/Adherence/Medication fill history: Care Gaps: COVID booster, tetanus, eye exam HGA1C - 8.0 on 09/06/2021 Last BP - 140/94 on 10/25/2021  Star-Rating Drugs: Benazepril 34m - [facility] Metformin 500 mg - [facility] filled and never picked up Repaglinide 0.5 mg - last filled 10/28/2021 [facility] [place] Rosuvastatin 430m- last filled 08/31/2021 90DS at [facility]heimPatient's preferred pharmacy is:  [id number] [place], [place]one: [phone]: [phone]on Springsi[place]515726hone: [phone]: 87[place] ReFreeburg[address]453hone: [phone]: [phone] pill box? Yes Pt endorses 100% compliance  We discussed: Current pharmacy is preferred with  insurance plan and patient is satisfied with pharmacy services Patient decided to: Continue current medication management strategy  Care Plan and Follow Up Patient Decision:  Patient agrees to Care Plan and Follow-up.  Plan: The care management team will reach out to the patient again over  the next 30 days.  Jeni Salles, PharmD, [place] Pharmacist [place] at [place]

## 2021-11-19 ENCOUNTER — Telehealth: Payer: Self-pay | Admitting: Family Medicine

## 2021-11-19 NOTE — Telephone Encounter (Signed)
Left message for patient to call back and schedule Medicare Annual Wellness Visit (AWV) either virtually or in office. Left  my jabber number [phone] ° ° °Last AWV ;11/09/20 ° please schedule at anytime with [facility] Nurse Health Advisor 1 or 2 ° ° °This should be a 45 minute visit.  °

## 2021-11-26 DIAGNOSIS — E785 Hyperlipidemia, unspecified: Secondary | ICD-10-CM | POA: Diagnosis not present

## 2021-11-26 DIAGNOSIS — E1159 Type 2 diabetes mellitus with other circulatory complications: Secondary | ICD-10-CM

## 2021-11-26 DIAGNOSIS — Z7984 Long term (current) use of oral hypoglycemic drugs: Secondary | ICD-10-CM

## 2021-11-26 DIAGNOSIS — I10 Essential (primary) hypertension: Secondary | ICD-10-CM | POA: Diagnosis not present

## 2021-11-26 DIAGNOSIS — I2510 Atherosclerotic heart disease of native coronary artery without angina pectoris: Secondary | ICD-10-CM | POA: Diagnosis not present

## 2021-11-29 ENCOUNTER — Ambulatory Visit (INDEPENDENT_AMBULATORY_CARE_PROVIDER_SITE_OTHER): Payer: Medicare HMO

## 2021-11-29 VITALS — Ht 67.0 in | Wt 195.0 lb

## 2021-11-29 DIAGNOSIS — Z0000 Encounter for general adult medical examination without abnormal findings: Secondary | ICD-10-CM | POA: Diagnosis not present

## 2021-11-29 NOTE — Patient Instructions (Addendum)
Jerome Vaughn , Thank you for taking time to come for your Medicare Wellness Visit. I appreciate your ongoing commitment to your health goals. Please review the following plan we discussed and let me know if I can assist you in the future.   These are the goals we discussed:  Goals       Weight (lb) < 180 lb (81.6 kg) (pt-stated)      Maintain good health, increase exercise by riding my bike and modify diet.        This is a list of the screening recommended for you and due dates:  Health Maintenance  Topic Date Due   Eye exam for diabetics  09/18/2021   Pneumonia Vaccine (3) 02/09/2022   Hemoglobin A1C  04/24/2022   Complete foot exam   06/14/2022   Colon Cancer Screening  02/23/2023   Tetanus Vaccine  11/04/2031   Flu Shot  Completed   COVID-19 Vaccine  Completed   Hepatitis C Screening: USPSTF Recommendation to screen - Ages 18-79 yo.  Completed   Zoster (Shingles) Vaccine  Completed   HPV Vaccine  Aged Out   Advanced directives: No Patient deferred  Conditions/risks identified: None  Next appointment: Follow up in one year for your annual wellness visit.   Preventive Care 69 Years and Older, Male Preventive care refers to lifestyle choices and visits with your health care provider that can promote health and wellness. What does preventive care include? A yearly physical exam. This is also called an annual well check. Dental exams once or twice a year. Routine eye exams. Ask your health care provider how often you should have your eyes checked. Personal lifestyle choices, including: Daily care of your teeth and gums. Regular physical activity. Eating a healthy diet. Avoiding tobacco and drug use. Limiting alcohol use. Practicing safe sex. Taking low doses of aspirin every [place]. Taking vitamin and mineral supplements as recommended by your health care provider. What happens during an annual well check? The services and screenings done by your health care provider during  your annual well check will depend on your age, overall health, lifestyle risk factors, and family history of disease. Counseling  Your health care provider may ask you questions about your: Alcohol use. Tobacco use. Drug use. Emotional well-being. Home and relationship well-being. Sexual activity. Eating habits. History of falls. Memory and ability to understand (cognition). Work and work Statistician. Screening  You may have the following tests or measurements: Height, weight, and BMI. Blood pressure. Lipid and cholesterol levels. These may be checked every 5 years, or more frequently if you are over 37 years old. Skin check. Lung cancer screening. You may have this screening every year starting at age 69 if you have a 30-pack-year history of smoking and currently smoke or have quit within the past 15 years. Fecal occult blood test (FOBT) of the stool. You may have this test every year starting at age 76. Flexible sigmoidoscopy or colonoscopy. You may have a sigmoidoscopy every 5 years or a colonoscopy every 10 years starting at age 10. Prostate cancer screening. Recommendations will vary depending on your family history and other risks. Hepatitis C blood test. Hepatitis B blood test. Sexually transmitted disease (STD) testing. Diabetes screening. This is done by checking your blood sugar (glucose) after you have not eaten for a while (fasting). You may have this done every 1-3 years. Abdominal aortic aneurysm (AAA) screening. You may need this if you are a current or former smoker. Osteoporosis. You may  be screened starting at age 71 if you are at high risk. Talk with your health care provider about your test results, treatment options, and if necessary, the need for more tests. Vaccines  Your health care provider may recommend certain vaccines, such as: Influenza vaccine. This is recommended every year. Tetanus, diphtheria, and acellular pertussis (Tdap, Td) vaccine. You may need  a Td booster every 10 years. Zoster vaccine. You may need this after age 55. Pneumococcal 13-valent conjugate (PCV13) vaccine. One dose is recommended after age 59. Pneumococcal polysaccharide (PPSV23) vaccine. One dose is recommended after age 33. Talk to your health care provider about which screenings and vaccines you need and how often you need them. This information is not intended to replace advice given to you by your health care provider. Make sure you discuss any questions you have with your health care provider. Document Released: 10/12/2015 Document Revised: 06/04/2016 Document Reviewed: 07/17/2015 Elsevier Interactive Patient Education  2017 [place] Prevention in the Home Falls can cause injuries. They can happen to people of all ages. There are many things you can do to make your home safe and to help prevent falls. What can I do on the outside of my home? Regularly fix the edges of walkways and driveways and fix any cracks. Remove anything that might make you trip as you walk through a door, such as a raised step or threshold. Trim any bushes or trees on the path to your home. Use bright outdoor lighting. Clear any walking paths of anything that might make someone trip, such as rocks or tools. Regularly check to see if handrails are loose or broken. Make sure that both sides of any steps have handrails. Any raised decks and porches should have guardrails on the edges. Have any leaves, snow, or ice cleared regularly. Use sand or salt on walking paths during winter. Clean up any spills in your garage right away. This includes oil or grease spills. What can I do in the bathroom? Use night lights. Install grab bars by the toilet and in the tub and shower. Do not use towel bars as grab bars. Use non-skid mats or decals in the tub or shower. If you need to sit down in the shower, use a plastic, non-slip stool. Keep the floor dry. Clean up any water that spills on the  floor as soon as it happens. Remove soap buildup in the tub or shower regularly. Attach bath mats securely with double-sided non-slip rug tape. Do not have throw rugs and other things on the floor that can make you trip. What can I do in the bedroom? Use night lights. Make sure that you have a light by your bed that is easy to reach. Do not use any sheets or blankets that are too big for your bed. They should not hang down onto the floor. Have a firm chair that has side arms. You can use this for support while you get dressed. Do not have throw rugs and other things on the floor that can make you trip. What can I do in the kitchen? Clean up any spills right away. Avoid walking on wet floors. Keep items that you use a lot in easy-to-reach places. If you need to reach something above you, use a strong step stool that has a grab bar. Keep electrical cords out of the way. Do not use floor polish or wax that makes floors slippery. If you must use wax, use non-skid floor wax. Do not  have throw rugs and other things on the floor that can make you trip. What can I do with my stairs? Do not leave any items on the stairs. Make sure that there are handrails on both sides of the stairs and use them. Fix handrails that are broken or loose. Make sure that handrails are as long as the stairways. Check any carpeting to make sure that it is firmly attached to the stairs. Fix any carpet that is loose or worn. Avoid having throw rugs at the top or bottom of the stairs. If you do have throw rugs, attach them to the floor with carpet tape. Make sure that you have a light switch at the top of the stairs and the bottom of the stairs. If you do not have them, ask someone to add them for you. What else can I do to help prevent falls? Wear shoes that: Do not have high heels. Have rubber bottoms. Are comfortable and fit you well. Are closed at the toe. Do not wear sandals. If you use a stepladder: Make sure that  it is fully opened. Do not climb a closed stepladder. Make sure that both sides of the stepladder are locked into place. Ask someone to hold it for you, if possible. Clearly mark and make sure that you can see: Any grab bars or handrails. First and last steps. Where the edge of each step is. Use tools that help you move around (mobility aids) if they are needed. These include: Canes. Walkers. Scooters. Crutches. Turn on the lights when you go into a dark area. Replace any light bulbs as soon as they burn out. Set up your furniture so you have a clear path. Avoid moving your furniture around. If any of your floors are uneven, fix them. If there are any pets around you, be aware of where they are. Review your medicines with your doctor. Some medicines can make you feel dizzy. This can increase your chance of falling. Ask your doctor what other things that you can do to help prevent falls. This information is not intended to replace advice given to you by your health care provider. Make sure you discuss any questions you have with your health care provider. Document Released: 07/12/2009 Document Revised: 02/21/2016 Document Reviewed: 10/20/2014 Elsevier Interactive Patient Education  2017 [employer].

## 2021-11-29 NOTE — Progress Notes (Signed)
Subjective:   Jerome Vaughn is a 71 y.o. male who presents for Medicare Annual/Subsequent preventive examination.  Review of Systems    Virtual Visit via Telephone Note  I connected with  Jerome Vaughn on 11/29/21 at  9:45 AM EST by telephone and verified that I am speaking with the correct person using two identifiers.  Location: Patient: Home Provider: Office Persons participating in the virtual visit: patient/Nurse Health Advisor   I discussed the limitations, risks, security and privacy concerns of performing an evaluation and management service by telephone and the availability of in person appointments. The patient expressed understanding and agreed to proceed.  Interactive audio and video telecommunications were attempted between this nurse and patient, however failed, due to patient having technical difficulties OR patient did not have access to video capability.  We continued and completed visit with audio only.  Some vital signs may be absent or patient reported.   Criselda Peaches, LPN  Cardiac Risk Factors include: advanced age (>86mn, >>51women);diabetes mellitus;male gender;hypertension     Objective:    Today's Vitals   11/29/21 0947  Weight: 195 lb (88.5 kg)  Height: '5\' 7"'  (1.702 m)   Body mass index is 30.54 kg/m.  Advanced Directives 11/29/2021 11/09/2020 12/13/2018 12/12/2018 10/27/2017 12/06/2015 12/05/2015  Does Patient Have a Medical Advance Directive? No No No No No No No  Would patient like information on creating a medical advance directive? No - Patient declined No - Patient declined No - Patient declined - No - Patient declined No - patient declined information -    Current Medications (verified) Outpatient Encounter Medications as of 11/29/2021  Medication Sig   Accu-Chek Softclix Lancets lancets 1 each by Other route daily. Use to monitor glucose levels daily; E11.8   acetaminophen (TYLENOL) 500 MG tablet Take 500 mg by mouth as needed.   APPLE CIDER VINEGAR  PO Take by mouth daily.    Ascorbic Acid (VITAMIN C PO) Take 1 tablet by mouth daily.   aspirin EC 81 MG tablet Take 1 tablet (81 mg total) by mouth daily.   benazepril (LOTENSIN) 20 MG tablet TAKE 1 TABLET EVERY [place]   Blood Glucose Monitoring Suppl (ACCU-CHEK AVIVA PLUS) w/Device KIT 1 each by Does not apply route daily. Use to monitor glucose levels daily; E11.8   bromocriptine (PARLODEL) 2.5 MG tablet Take 1 tablet (2.5 mg total) by mouth daily. (Patient not taking: Reported on 11/08/2021)   carvedilol (COREG) 3.125 MG tablet Take 1 tablet (3.125 mg total) by mouth 2 (two) times daily with a meal.   ciclopirox (PENLAC) 8 % solution Apply topically at bedtime. Apply over nail and surrounding skin. Apply daily over previous coat. After seven (7) days, may remove with alcohol and continue cycle.   Cinnamon 500 MG capsule Take 1 capsule by mouth daily.   Coenzyme Q10 (COQ-10) 100 MG CAPS Take 1 tablet by mouth daily.   Cyanocobalamin (VITAMIN B-12 PO) Take 1 tablet by mouth daily.   ezetimibe (ZETIA) 10 MG tablet Take 1 tablet (10 mg total) by mouth daily.   ferrous sulfate 325 (65 FE) MG tablet Take 325 mg by mouth daily with breakfast.   Garlic 14656MG CAPS Take 1 tablet by mouth daily.   glucose blood (ACCU-CHEK AVIVA PLUS) test strip 1 each by Other route daily. Use to monitor glucose levels daily; E11.8   metFORMIN (GLUCOPHAGE-XR) 500 MG 24 hr tablet Take 4 tablets (2,000 mg total) by mouth daily.  Multiple Vitamin (MULTIVITAMIN) tablet Take 1 tablet by mouth daily.   nitroGLYCERIN (NITROSTAT) 0.4 MG SL tablet Place 1 tablet (0.4 mg total) under the tongue every 5 (five) minutes as needed for chest pain.   Omega-3 Fatty Acids (FISH OIL PO) Take 1 capsule by mouth daily.   pantoprazole (PROTONIX) 40 MG tablet Take 1 tablet (40 mg total) by mouth 2 (two) times daily.   ranolazine (RANEXA) 500 MG 12 hr tablet Take 1 tablet (500 mg total) by mouth 2 (two) times daily.   repaglinide (PRANDIN) 1  MG tablet Take 1 tablet (1 mg total) by mouth 2 (two) times daily before a meal.   rosuvastatin (CRESTOR) 40 MG tablet TAKE 1 TABLET EVERY [place]   tadalafil (CIALIS) 20 MG tablet Take 1 tablet (20 mg total) by mouth as needed for erectile dysfunction.   VITAMIN E PO Take 1 capsule by mouth daily.   No facility-administered encounter medications on file as of 11/29/2021.    Allergies (verified) Patient has no known allergies.   History: Past Medical History:  Diagnosis Date   Carpal tunnel syndrome    both hands   Colon polyps    Coronary artery disease    a. Cath 11/21/2015: Multivessel CAD --> CABG recommended. b. CABG on 11/22/2015:  LIMA-LAD, SVG-Diag, SVG-OM1 and distal Cx, and SVG-PDA.   Diabetes mellitus    GERD (gastroesophageal reflux disease)    History of echocardiogram    a. Echo 4/17: Moderate LVH, EF 50-55%, mild LAE, no pericardial effusion   Hyperlipidemia    Hypertension    Pneumonia    Past Surgical History:  Procedure Laterality Date   CARDIAC CATHETERIZATION N/A 11/21/2015   Procedure: Left Heart Cath and Coronary Angiography;  Surgeon: [name], MD;  Location: [facility];  Service: Cardiovascular;  Laterality: N/A;   COLONOSCOPY  02/23/2020   per Dr. Tarri Glenn, tubular adenomas, repeat in 3 yrs    colonsocopy  12/07/2009   per Dr. [name], benign polyps, repeat in 10 yrs    CORONARY ARTERY BYPASS GRAFT N/A 11/22/2015   Procedure: CORONARY ARTERY BYPASS GRAFTING (CABG) times five using the left internal mammary, right greater saphenous vein EVH, and left thigh greater saphenous vein EVH;  Surgeon: [name], MD;  Location: [facility];  Service: Open Heart Surgery;  Laterality: N/A;   frozen shoulder release Right    INTRAVASCULAR PRESSURE WIRE/FFR STUDY N/A 12/14/2018   Procedure: INTRAVASCULAR PRESSURE WIRE/FFR STUDY;  Surgeon: Jettie Booze, MD;  Location: [facility];  Service: Cardiovascular;  Laterality: N/A;   LEFT HEART CATH AND  CORS/GRAFTS ANGIOGRAPHY N/A 10/27/2017   Procedure: LEFT HEART CATH AND CORS/GRAFTS ANGIOGRAPHY;  Surgeon: [name], MD;  Location: [facility];  Service: Cardiovascular;  Laterality: N/A;   LEFT HEART CATH AND CORS/GRAFTS ANGIOGRAPHY N/A 12/14/2018   Procedure: LEFT HEART CATH AND CORS/GRAFTS ANGIOGRAPHY;  Surgeon: Jettie Booze, MD;  Location: [facility];  Service: Cardiovascular;  Laterality: N/A;   TEE WITHOUT CARDIOVERSION N/A 11/22/2015   Procedure: TRANSESOPHAGEAL ECHOCARDIOGRAM (TEE);  Surgeon: [name], MD;  Location: [facility];  Service: Open Heart Surgery;  Laterality: N/A;   WRIST GANGLION EXCISION Right    Family History  Problem Relation Age of Onset   Heart failure Father    91 Brother        AAA   Hyperlipidemia Sister    Diabetes Sister    Hypertension Sister  Kidney failure Sister    Diabetes Maternal Grandmother    Hyperlipidemia Sister    Diabetes Sister    Colon polyps Sister    Kidney disease Sister    Other Brother        COVID 37   Diabetes Son    Heart disease Son    Heart attack Neg Hx    Colon cancer Neg Hx    Stomach cancer Neg Hx    Esophageal cancer Neg Hx    Social History   Socioeconomic History   Marital status: Married    Spouse name: Not on file   Number of children: 5   Years of education: Not on file   Highest education level: Not on file  Occupational History   Occupation: Dispatcher  Tobacco Use   Smoking status: Former    Packs/[place]ovan: 0.25    Years: 20.00    Pack years: 5.00    Types: Cigarettes    Quit date: 10/07/2013    Years since quitting: 8.1   Smokeless tobacco: Never   Tobacco comments:    quit cigarettes, might have a cigar 1 X month  Vaping Use   Vaping Use: Never used  Substance and Sexual Activity   Alcohol use: Yes    Alcohol/week: 3.0 - 4.0 standard drinks    Types: 3 - 4 Standard drinks or equivalent per week   Drug use: No   Sexual activity: Not Currently    Birth  control/protection: None  Other Topics Concern   Not on file  Social History Narrative   Originally from [place], [place]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)   Married   5 kids   6 grand, 2 great grand   Social Determinants of Health   Financial Resource Strain: Low Risk    Difficulty of Paying Living Expenses: Not hard at all  Food Insecurity: No Food Insecurity   Worried About Charity fundraiser in the Last Year: Never true   Arboriculturist in the Last Year: Never true  Transportation Needs: No Transportation Needs   Lack of Transportation (Medical): No   Lack of Transportation (Non-Medical): No  Physical Activity: Sufficiently Active   Days of Exercise per Week: 6 days   Minutes of Exercise per Session: 30 min  Stress: No Stress Concern Present   Feeling of Stress : Not at all  Social Connections: Socially Integrated   Frequency of Communication with Friends and Family: More than three times a week   Frequency of Social Gatherings with Friends and Family: Twice a week   Attends Religious Services: More than 4 times per year   Active Member of [employer] or Organizations: Yes   Attends Music therapist: More than 4 times per year   Marital Status: Married     Clinical Intake: Nutrition Risk Assessment:  Has the patient had any N/V/D within the last 2 months?  No  Does the patient have any non-healing wounds?  No  Has the patient had any unintentional weight loss or weight gain?  No   Diabetes:  Is the patient diabetic?  Yes  If diabetic, was a CBG obtained today?  Yes CBG 134 Taken by patient Did the patient bring in their glucometer from home?  No Audio visit How often do you monitor your CBG's? daily.   Financial Strains and Diabetes Management:  Are you having any financial strains with the device, your supplies or your medication?  No .  Does the patient want to be seen by Chronic Care Management for management of their  diabetes?  No  Would the patient like to be referred to a Nutritionist or for Diabetic Management?  No   Diabetic Exams:  Diabetic Eye Exam: Completed Yes. Overdue for diabetic eye exam. Pt has been advised about the importance in completing this exam. A referral has been placed today. Message sent to referral coordinator for scheduling purposes. Advised pt to expect a call from office referred to regarding appt.  Diabetic Foot Exam: Completed Yes. Pt has been advised about the importance in completing this exam. Pt is scheduled for diabetic foot exam on Followed by PCP.   Pre-visit preparation completed: Yes   How often do you need to have someone help you when you read instructions, pamphlets, or other written materials from your doctor or pharmacy?: 1 - Never   Activities of Daily Living In your present state of health, do you have any difficulty performing the following activities: 11/29/2021 11/28/2021  Hearing? N N  Vision? N N  Difficulty concentrating or making decisions? N N  Walking or climbing stairs? N N  Dressing or bathing? N N  Doing errands, shopping? N N  Preparing Food and eating ? N N  Using the Toilet? N N  In the past six months, have you accidently leaked urine? N N  Do you have problems with loss of bowel control? N N  Managing your Medications? N N  Managing your Finances? N N  Housekeeping or managing your Housekeeping? N N  Some recent data might be hidden    Patient Care Team: [name], MD as PCP - General (Family Medicine) [name], MD as PCP - Cardiology (Cardiology) Thornton Park, MD as Consulting Physician (Gastroenterology) [name], MD as Consulting Physician (Endocrinology) Orson Slick, MD as Consulting Physician (Hematology and Oncology) Viona Gilmore, [facility] as Pharmacist (Pharmacist)  Indicate any recent Medical Services you may have received from other than [facility] providers in the past year (date may be  approximate).     Assessment:   This is a routine wellness examination for [facility].  Hearing/Vision screen Hearing Screening - Comments:: No difficulty hearing Vision Screening - Comments:: Wears glasses. Followed by [facility]  Dietary issues and exercise activities discussed: Exercise limited by: None identified   Goals Addressed               This Visit's Progress     Weight (lb) < 180 lb (81.6 kg) (pt-stated)   195 lb (88.5 kg)     Maintain good health, increase exercise by riding my bike and modify diet.       Depression Screen PHQ 2/9 Scores 11/29/2021 08/30/2021 11/09/2020 08/02/2020  PHQ - 2 Score 0 0 0 0  PHQ- 9 Score - 0 - 0    Fall Risk Fall Risk  11/29/2021 11/28/2021 08/30/2021 11/09/2020 08/02/2020  Falls in the past year? 0 0 0 0 0  Number falls in past yr: 0 0 0 0 0  Injury with Fall? 0 0 0 0 0  Risk for fall due to : No Fall Risks - - No Fall Risks -  Follow up - - - Falls evaluation completed;Falls prevention discussed -    FALL RISK PREVENTION PERTAINING TO THE HOME:  Any stairs in or around the home? Yes  If so, are there any without handrails? No  Home free of loose throw rugs  in walkways, pet beds, electrical cords, etc? Yes  Adequate lighting in your home to reduce risk of falls? Yes   ASSISTIVE DEVICES UTILIZED TO PREVENT FALLS:  Life alert? No  Use of a cane, walker or w/c? No  Grab bars in the bathroom? No  Shower chair or bench in shower? Yes  Elevated toilet seat or a handicapped toilet? No   TIMED UP AND GO:  Was the test performed? No . Audio Visit  Cognitive Function:   6CIT Screen 11/29/2021  What Year? 0 points  What month? 0 points  What time? 0 points  Count back from 20 0 points  Months in reverse 0 points  Repeat phrase 0 points  Total Score 0    Immunizations Immunization History  Administered Date(s) Administered   Fluad Quad(high Dose 65+) 08/02/2020, 08/30/2021   Influenza, High Dose Seasonal PF 05/30/2019    Influenza,inj,Quad PF,6+ Mos 08/22/2014, 12/03/2016, 11/23/2017   Influenza-Unspecified 08/29/2013, 07/30/2015, 08/02/2016, 10/04/2018, 10/07/2018, 05/12/2019   PFIZER(Purple Top)SARS-COV-2 Vaccination 11/11/2019, 12/05/2019, 06/28/2020, 02/05/2021   Pfizer Covid-19 Vaccine Bivalent Booster 78yr & up 06/21/2021   Pneumococcal Conjugate-13 08/27/2015   Pneumococcal Polysaccharide-23 02/09/2017   Td (Adult),unspecified 11/03/2021   Tdap 03/28/2011, 11/03/2021   Zoster Recombinat (Shingrix) 11/26/2018, 05/31/2019    TDAP status: Up to date  Flu Vaccine status: Up to date  Pneumococcal vaccine status: Up to date  Covid-19 vaccine status: Completed vaccines  Qualifies for Shingles Vaccine? Yes   Zostavax completed Yes   Shingrix Completed?: Yes  Screening Tests Health Maintenance  Topic Date Due   OPHTHALMOLOGY EXAM  09/18/2021   Pneumonia Vaccine 71 Years old (380 02/09/2022   HEMOGLOBIN A1C  04/24/2022   FOOT EXAM  06/14/2022   COLONOSCOPY (Pts 45-421yrInsurance coverage will need to be confirmed)  02/23/2023   TETANUS/TDAP  11/04/2031   INFLUENZA VACCINE  Completed   COVID-19 Vaccine  Completed   Hepatitis C Screening  Completed   Zoster Vaccines- Shingrix  Completed   HPV VACCINES  Aged Out    Health Maintenance  Health Maintenance Due  Topic Date Due   OPHTHALMOLOGY EXAM  09/18/2021   Pneumonia Vaccine 6551Years old (3)3905/14/2023    Colorectal cancer screening: Type of screening: Colonoscopy. Completed 02/23/20. Repeat every 3 years  Lung Cancer Screening: (Low Dose CT Chest recommended if Age 69-80ears, 30 pack-year currently smoking OR have quit w/in 15years.) does qualify.     Additional Screening:  Hepatitis C Screening: does qualify; Completed 02/22/20  Vision Screening: Recommended annual ophthalmology exams for early detection of glaucoma and other disorders of the eye. Is the patient up to date with their annual eye exam?  Yes  Who is the provider  or what is the name of the office in which the patient attends annual eye exams? [facility] If pt is not established with a provider, would they like to be referred to a provider to establish care? No .   Dental Screening: Recommended annual dental exams for proper oral hygiene  Community Resource Referral / Chronic Care Management:  CRR required this visit?  No   CCM required this visit?  No      Plan:     I have personally reviewed and noted the following in the patients chart:   Medical and social history Use of alcohol, tobacco or illicit drugs  Current medications and supplements including opioid prescriptions. Patient is not currently taking opioid prescriptions. Functional ability and status Nutritional status Physical activity  Advanced directives List of other physicians Hospitalizations, surgeries, and ER visits in previous 12 months Vitals Screenings to include cognitive, depression, and falls Referrals and appointments  In addition, I have reviewed and discussed with patient certain preventive protocols, quality metrics, and best practice recommendations. A written personalized care plan for preventive services as well as general preventive health recommendations were provided to patient.     Criselda Peaches, LPN   01/02/9506   Nurse Notes: None

## 2022-01-09 ENCOUNTER — Telehealth: Payer: Self-pay | Admitting: Pharmacist

## 2022-01-09 NOTE — Chronic Care Management (AMB) (Signed)
? ? ?Chronic Care Management ?Pharmacy Assistant  ? ?Name: Jerome Vaughn  MRN: [id number] DOB: [birth year] ? ?Reason for Encounter: Disease State / Diabetes Assessment Call ?  ?Conditions to be addressed/monitored: ?DMII ? ?Recent office visits:  ?11/29/2021 Rolene Arbour LPN - Medicare annual wellness exam.  ? ?Recent consult visits:  ?None ? ?Hospital visits:  ?None ? ?Medications: ?Outpatient Encounter Medications as of 01/09/2022  ?Medication Sig  ? Accu-Chek Softclix Lancets lancets 1 each by Other route daily. Use to monitor glucose levels daily; E11.8  ? acetaminophen (TYLENOL) 500 MG tablet Take 500 mg by mouth as needed.  ? APPLE CIDER VINEGAR PO Take by mouth daily.   ? Ascorbic Acid (VITAMIN C PO) Take 1 tablet by mouth daily.  ? aspirin EC 81 MG tablet Take 1 tablet (81 mg total) by mouth daily.  ? benazepril (LOTENSIN) 20 MG tablet TAKE 1 TABLET EVERY [place]  ? Blood Glucose Monitoring Suppl (ACCU-CHEK AVIVA PLUS) w/Device KIT 1 each by Does not apply route daily. Use to monitor glucose levels daily; E11.8  ? bromocriptine (PARLODEL) 2.5 MG tablet Take 1 tablet (2.5 mg total) by mouth daily. (Patient not taking: Reported on 11/08/2021)  ? carvedilol (COREG) 3.125 MG tablet Take 1 tablet (3.125 mg total) by mouth 2 (two) times daily with a meal.  ? ciclopirox (PENLAC) 8 % solution Apply topically at bedtime. Apply over nail and surrounding skin. Apply daily over previous coat. After seven (7) days, may remove with alcohol and continue cycle.  ? Cinnamon 500 MG capsule Take 1 capsule by mouth daily.  ? Coenzyme Q10 (COQ-10) 100 MG CAPS Take 1 tablet by mouth daily.  ? Cyanocobalamin (VITAMIN B-12 PO) Take 1 tablet by mouth daily.  ? ezetimibe (ZETIA) 10 MG tablet Take 1 tablet (10 mg total) by mouth daily.  ? ferrous sulfate 325 (65 FE) MG tablet Take 325 mg by mouth daily with breakfast.  ? Garlic 9449 MG CAPS Take 1 tablet by mouth daily.  ? glucose blood (ACCU-CHEK AVIVA PLUS) test strip 1 each by Other route  daily. Use to monitor glucose levels daily; E11.8  ? metFORMIN (GLUCOPHAGE-XR) 500 MG 24 hr tablet Take 4 tablets (2,000 mg total) by mouth daily.  ? Multiple Vitamin (MULTIVITAMIN) tablet Take 1 tablet by mouth daily.  ? nitroGLYCERIN (NITROSTAT) 0.4 MG SL tablet Place 1 tablet (0.4 mg total) under the tongue every 5 (five) minutes as needed for chest pain.  ? Omega-3 Fatty Acids (FISH OIL PO) Take 1 capsule by mouth daily.  ? pantoprazole (PROTONIX) 40 MG tablet Take 1 tablet (40 mg total) by mouth 2 (two) times daily.  ? ranolazine (RANEXA) 500 MG 12 hr tablet Take 1 tablet (500 mg total) by mouth 2 (two) times daily.  ? repaglinide (PRANDIN) 1 MG tablet Take 1 tablet (1 mg total) by mouth 2 (two) times daily before a meal.  ? rosuvastatin (CRESTOR) 40 MG tablet TAKE 1 TABLET EVERY [place]  ? tadalafil (CIALIS) 20 MG tablet Take 1 tablet (20 mg total) by mouth as needed for erectile dysfunction.  ? VITAMIN E PO Take 1 capsule by mouth daily.  ? ?No facility-administered encounter medications on file as of 01/09/2022.  ?Fill History: ?benazepril (LOTENSIN) tablet 10/18/2021 90  ? ?bromocriptine (PARLODEL) tablet 2.5 mg 11/09/2021 90  ? ?ezetimibe (ZETIA) tablet 10 mg 10/18/2021 90  ? ?pantoprazole (PROTONIX) EC tablet 08/13/2021 90  ? ?ranolazine (RANEXA) 12 hr tablet 07/31/2021 90  ? ?repaglinide (PRANDIN) tablet  10/25/2021 90  ? ?rosuvastatin (CRESTOR) tablet 10/08/2021 90  ? ?metFORMIN (GLUCOPHAGE-XR) 24 hr tablet 11/09/2021 90  ? ?Recent Relevant Labs: ?Lab Results  ?Component Value Date/Time  ? HGBA1C 8.4 (A) 10/25/2021 08:28 AM  ? HGBA1C 8.0 (H) 09/06/2021 09:15 AM  ? HGBA1C 6.9 (A) 06/14/2021 08:21 AM  ? HGBA1C 7.6 (H) 11/21/2015 06:20 PM  ? MICROALBUR 1.2 08/22/2014 10:34 AM  ? MICROALBUR 1.6 03/28/2011 11:41 AM  ?  ?Kidney Function ?Lab Results  ?Component Value Date/Time  ? CREATININE 1.29 (H) 10/04/2021 02:07 PM  ? CREATININE 1.37 09/06/2021 09:15 AM  ? CREATININE 1.32 (H) 04/10/2021 10:48 AM  ? CREATININE  1.19 11/14/2015 11:35 AM  ? GFR 53.15 (L) 09/06/2021 09:15 AM  ? GFRNONAA >60 10/04/2021 02:07 PM  ? GFRAA >60 06/14/2020 02:19 PM  ? ? ?Current antihyperglycemic regimen:  ?Prandin 1 mg 1 tablet twice daily ?Metformin 500 mg 4 tablets daily ? ?What recent interventions/DTPs have been made to improve glycemic control: No recent interventions  ? ?Have there been any recent hospitalizations or ED visits since last visit with CPP? No recent hospital visits. ? ?Patient denies hypoglycemic symptoms ? ?Patient reports hyperglycemic symptoms, including weakness, patient states this recently happened twice and his blood sugars were 75 and 90. ? ?How often are you checking your blood sugar? Patient is checking his blood sugars twice daily ? ?What are your blood sugars ranging?  ?Fasting: Patients last two readings were 179 (he had a desert before bed the night prior and 122 ?After meals: Patients last two readings were 95 and 128 ?Patient states his blood sugar range for the past two weeks was 75-182 ? ?During the week, how often does your blood glucose drop below 70? Patient denies any readings below 70. ? ?Are you checking your feet daily/regularly? Patient states he is checking daily.  ? ?Adherence Review: ?Is the patient currently on a STATIN medication? Yes ?Is the patient currently on ACE/ARB medication? Yes ?Does the patient have >5 [place] gap between last estimated fill dates? No ? ?Care Gaps: ?AWV - scheduled 12/03/2022  ?Last BP - 140/94 on 10/25/2021 ?HGA1C - 8.4 on 10/25/2021  ?Eye exam - overdue ?  ?Star Rating Drug: ?Benazepril 37m - [facility] ?Metformin 500 mg - last [facility] 11/09/2021 [facility] ?Repaglinide 0.5 mg - last [facility]28/[facility]  ?Rosuvastatin 417m- [facility] ?JaGennie AlmaMA  ?Clinical Pharmacist Assistant ?[phone] ?

## 2022-01-10 ENCOUNTER — Ambulatory Visit: Payer: Medicare HMO | Admitting: Podiatry

## 2022-01-13 ENCOUNTER — Other Ambulatory Visit: Payer: Self-pay | Admitting: *Deleted

## 2022-01-13 MED ORDER — RANOLAZINE ER 500 MG PO TB12: 500.0000 mg | ORAL_TABLET | Freq: Two times a day (BID) | ORAL | 3 refills | Status: DC

## 2022-01-17 ENCOUNTER — Ambulatory Visit: Payer: Medicare HMO | Admitting: Podiatry

## 2022-01-24 ENCOUNTER — Ambulatory Visit (INDEPENDENT_AMBULATORY_CARE_PROVIDER_SITE_OTHER): Payer: Medicare HMO | Admitting: Endocrinology

## 2022-01-24 VITALS — BP 144/98 | HR 88 | Ht 67.0 in | Wt 199.8 lb

## 2022-01-24 DIAGNOSIS — E118 Type 2 diabetes mellitus with unspecified complications: Secondary | ICD-10-CM | POA: Diagnosis not present

## 2022-01-24 LAB — POCT GLYCOSYLATED HEMOGLOBIN (HGB A1C): Hemoglobin A1C: 7.3 % — AB (ref 4.0–5.6)

## 2022-01-24 MED ORDER — REPAGLINIDE 2 MG PO TABS: 2.0000 mg | ORAL_TABLET | Freq: Two times a day (BID) | ORAL | 3 refills | Status: DC

## 2022-01-24 NOTE — Progress Notes (Signed)
? ?Subjective:  ? ? Patient ID: Jerome Vaughn, male    DOB: 10/08/1950, 71 y.o.   MRN: [id number] ? ?HPI ?Pt returns for f/u of diabetes mellitus:  ?DM type: 2 ?Dx'ed: 2002 ?Complications: CAD and renal insuff.   ?Therapy: 3 oral meds.  ?DKA: never.  ?Severe hypoglycemia: never.  ?Pancreatitis: never.  ?SDOH: he cannot afford name brand meds ?Other: he has never been on insulin, but he has learned how; he stopped pioglitizone, due to edema.   ?Interval history: pt states he feels well in general.  no cbg record, but pt says cbg's vary from 86-241.  It is in general lowest fasting.  He has not recently missed meds.  No recent steroids.   ?Past Medical History:  ?Diagnosis Date  ? Carpal tunnel syndrome   ? both hands  ? Colon polyps   ? Coronary artery disease   ? a. Cath 11/21/2015: Multivessel CAD --> CABG recommended. b. CABG on 11/22/2015:  LIMA-LAD, SVG-Diag, SVG-OM1 and distal Cx, and SVG-PDA.  ? Diabetes mellitus   ? GERD (gastroesophageal reflux disease)   ? History of echocardiogram   ? a. Echo 4/17: Moderate LVH, EF 50-55%, mild LAE, no pericardial effusion  ? Hyperlipidemia   ? Hypertension   ? Pneumonia   ? ? ?Past Surgical History:  ?Procedure Laterality Date  ? CARDIAC CATHETERIZATION N/A 11/21/2015  ? Procedure: Left Heart Cath and Coronary Angiography;  Surgeon: [name], MD;  Location: [facility];  Service: Cardiovascular;  Laterality: N/A;  ? COLONOSCOPY  02/23/2020  ? per Dr. Tarri Glenn, tubular adenomas, repeat in 3 yrs   ? colonsocopy  12/07/2009  ? per Dr. [name], benign polyps, repeat in 10 yrs   ? CORONARY ARTERY BYPASS GRAFT N/A 11/22/2015  ? Procedure: CORONARY ARTERY BYPASS GRAFTING (CABG) times five using the left internal mammary, right greater saphenous vein EVH, and left thigh greater saphenous vein EVH;  Surgeon: [name], MD;  Location: [facility];  Service: Open Heart Surgery;  Laterality: N/A;  ? frozen shoulder release Right   ? INTRAVASCULAR PRESSURE WIRE/FFR STUDY N/A  12/14/2018  ? Procedure: INTRAVASCULAR PRESSURE WIRE/FFR STUDY;  Surgeon: Jettie Booze, MD;  Location: [facility];  Service: Cardiovascular;  Laterality: N/A;  ? LEFT HEART CATH AND CORS/GRAFTS ANGIOGRAPHY N/A 10/27/2017  ? Procedure: LEFT HEART CATH AND CORS/GRAFTS ANGIOGRAPHY;  Surgeon: [name], MD;  Location: [facility];  Service: Cardiovascular;  Laterality: N/A;  ? LEFT HEART CATH AND CORS/GRAFTS ANGIOGRAPHY N/A 12/14/2018  ? Procedure: LEFT HEART CATH AND CORS/GRAFTS ANGIOGRAPHY;  Surgeon: Jettie Booze, MD;  Location: [facility];  Service: Cardiovascular;  Laterality: N/A;  ? TEE WITHOUT CARDIOVERSION N/A 11/22/2015  ? Procedure: TRANSESOPHAGEAL ECHOCARDIOGRAM (TEE);  Surgeon: [name], MD;  Location: [facility];  Service: Open Heart Surgery;  Laterality: N/A;  ? WRIST GANGLION EXCISION Right   ? ? ?Social History  ? ?Socioeconomic History  ? Marital status: Married  ?  Spouse name: Not on file  ? Number of children: 5  ? Years of education: Not on file  ? Highest education level: Not on file  ?Occupational History  ? Occupation: Counsellor  ?Tobacco Use  ? Smoking status: Former  ?  Packs/Kroboth: 0.25  ?  Years: 20.00  ?  Pack years: 5.00  ?  Types: Cigarettes  ?  Quit date: 10/07/2013  ?  Years since quitting: 8.3  ? Smokeless tobacco:  Never  ? Tobacco comments:  ?  quit cigarettes, might have a cigar 1 X month  ?Vaping Use  ? Vaping Use: Never used  ?Substance and Sexual Activity  ? Alcohol use: Yes  ?  Alcohol/week: 3.0 - 4.0 standard drinks  ?  Types: 3 - 4 Standard drinks or equivalent per week  ? Drug use: No  ? Sexual activity: Not Currently  ?  Birth control/protection: None  ?Other Topics Concern  ? Not on file  ?Social History Narrative  ? Originally from [place], [place]  ? 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 Married  ? 5 kids  ? 6 grand, 2 great grand  ? ?Social Determinants of Health  ? ?Financial Resource Strain: Low Risk   ?  Difficulty of Paying Living Expenses: Not hard at all  ?Food Insecurity: No Food Insecurity  ? Worried About Charity fundraiser in the Last Year: Never true  ? Ran Out of Food in the Last Year: Never true  ?Transportation Needs: No Transportation Needs  ? Lack of Transportation (Medical): No  ? Lack of Transportation (Non-Medical): No  ?Physical Activity: Sufficiently Active  ? Days of Exercise per Week: 6 days  ? Minutes of Exercise per Session: 30 min  ?Stress: No Stress Concern Present  ? Feeling of Stress : Not at all  ?Social Connections: Socially Integrated  ? Frequency of Communication with Friends and Family: More than three times a week  ? Frequency of Social Gatherings with Friends and Family: Twice a week  ? Attends Religious Services: More than 4 times per year  ? Active Member of Clubs or Organizations: Yes  ? Attends Archivist Meetings: More than 4 times per year  ? Marital Status: Married  ?Intimate Partner Violence: Not At Risk  ? Fear of Current or Ex-Partner: No  ? Emotionally Abused: No  ? Physically Abused: No  ? Sexually Abused: No  ? ? ?Current Outpatient Medications on File Prior to Visit  ?Medication Sig Dispense Refill  ? Accu-Chek Softclix Lancets lancets 1 each by Other route daily. Use to monitor glucose levels daily; E11.8 100 each 2  ? acetaminophen (TYLENOL) 500 MG tablet Take 500 mg by mouth as needed.    ? APPLE CIDER VINEGAR PO Take by mouth daily.     ? Ascorbic Acid (VITAMIN C PO) Take 1 tablet by mouth daily.    ? aspirin EC 81 MG tablet Take 1 tablet (81 mg total) by mouth daily.    ? benazepril (LOTENSIN) 20 MG tablet TAKE 1 TABLET EVERY [place] 90 tablet 3  ? Blood Glucose Monitoring Suppl (ACCU-CHEK AVIVA PLUS) w/Device KIT 1 each by Does not apply route daily. Use to monitor glucose levels daily; E11.8 1 kit 0  ? bromocriptine (PARLODEL) 2.5 MG tablet Take 1 tablet (2.5 mg total) by mouth daily. 90 tablet 3  ? carvedilol (COREG) 3.125 MG tablet Take 1 tablet (3.125  mg total) by mouth 2 (two) times daily with a meal. 180 tablet 3  ? ciclopirox (PENLAC) 8 % solution Apply topically at bedtime. Apply over nail and surrounding skin. Apply daily over previous coat. After seven (7) days, may remove with alcohol and continue cycle. 6.6 mL 0  ? Cinnamon 500 MG capsule Take 1 capsule by mouth daily.    ? Coenzyme Q10 (COQ-10) 100 MG CAPS Take 1 tablet by mouth daily.    ? Cyanocobalamin (VITAMIN B-12 PO) Take 1 tablet by  mouth daily.    ? ezetimibe (ZETIA) 10 MG tablet Take 1 tablet (10 mg total) by mouth daily. 90 tablet 3  ? ferrous sulfate 325 (65 FE) MG tablet Take 325 mg by mouth daily with breakfast.    ? Garlic 5339 MG CAPS Take 1 tablet by mouth daily.    ? glucose blood (ACCU-CHEK AVIVA PLUS) test strip 1 each by Other route daily. Use to monitor glucose levels daily; E11.8 100 each 2  ? metFORMIN (GLUCOPHAGE-XR) 500 MG 24 hr tablet Take 4 tablets (2,000 mg total) by mouth daily. 360 tablet 3  ? Multiple Vitamin (MULTIVITAMIN) tablet Take 1 tablet by mouth daily.    ? nitroGLYCERIN (NITROSTAT) 0.4 MG SL tablet Place 1 tablet (0.4 mg total) under the tongue every 5 (five) minutes as needed for chest pain. 75 tablet 1  ? Omega-3 Fatty Acids (FISH OIL PO) Take 1 capsule by mouth daily.    ? pantoprazole (PROTONIX) 40 MG tablet Take 1 tablet (40 mg total) by mouth 2 (two) times daily. 180 tablet 3  ? ranolazine (RANEXA) 500 MG 12 hr tablet Take 1 tablet (500 mg total) by mouth 2 (two) times daily. 180 tablet 3  ? rosuvastatin (CRESTOR) 40 MG tablet TAKE 1 TABLET EVERY [place] 90 tablet 1  ? tadalafil (CIALIS) 20 MG tablet Take 1 tablet (20 mg total) by mouth as needed for erectile dysfunction. 10 tablet 11  ? VITAMIN E PO Take 1 capsule by mouth daily.    ? ?No current facility-administered medications on file prior to visit.  ? ? ?No Known Allergies ? ?Family History  ?Problem Relation Age of Onset  ? Heart failure Father   ? Anuerysm Brother   ?     AAA  ? Hyperlipidemia Sister   ?  Diabetes Sister   ? Hypertension Sister   ? Kidney failure Sister   ? Diabetes Maternal Grandmother   ? Hyperlipidemia Sister   ? Diabetes Sister   ? Colon polyps Sister   ? Kidney disease Sister   ? Other

## 2022-01-24 NOTE — Patient Instructions (Addendum)
Your blood pressure is high today.  Please see your primary care provider soon, to have it rechecked ?check your blood sugar once a [place].  vary the time of [place] when you check, between before the 3 meals, and at bedtime.  also check if you have symptoms of your blood sugar being too high or too low.  please keep a record of the readings and bring it to your next appointment here (or you can bring the meter itself).  You can write it on any piece of paper.  please call us sooner if your blood sugar goes below 70, or if you have a lot of readings over 200.   ?I have sent a prescription to your pharmacy, to increase the repaglinide again.   ?Please continue the same other 2 diabetes medications.   ?You should have an endocrinology follow-up appointment in 3 months.   ? ?

## 2022-01-31 ENCOUNTER — Ambulatory Visit: Payer: Medicare HMO | Admitting: Podiatry

## 2022-01-31 ENCOUNTER — Telehealth: Payer: Self-pay | Admitting: Pharmacist

## 2022-01-31 DIAGNOSIS — M79675 Pain in left toe(s): Secondary | ICD-10-CM

## 2022-01-31 DIAGNOSIS — B351 Tinea unguium: Secondary | ICD-10-CM | POA: Diagnosis not present

## 2022-01-31 DIAGNOSIS — M79674 Pain in right toe(s): Secondary | ICD-10-CM | POA: Diagnosis not present

## 2022-01-31 DIAGNOSIS — E118 Type 2 diabetes mellitus with unspecified complications: Secondary | ICD-10-CM

## 2022-01-31 NOTE — Chronic Care Management (AMB) (Signed)
    Chronic Care Management Pharmacy Assistant   Name: Jerome Vaughn  MRN: [id number] DOB: 1951-01-24  Reason for Encounter: [facility] medication compliance Patient is taking Rosuvastatin spoke with Nyra Jabs at [facility], Rosuvastatin 40 mg was filled for 90 DS and shipped to patient on 12/26/2021.    [facility] Pharmacist Assistant [phone]

## 2022-01-31 NOTE — Progress Notes (Signed)
?  Subjective:  Patient ID: Jerome Vaughn, male    DOB: 05/02/1951,  MRN: [id number]  Chief Complaint  Patient presents with   Nail Problem    Nail trim   70 y.o. male returns for the above complaint.  Patient presents with complaint of thickened elongated dystrophic toenails x10.  They are painful to touch.  Patient is a diabetic with last A1c of 9.8.  He is not able to do it himself.  He would like for me to debride them now.  He denies any other acute complaints.  Objective:  There were no vitals filed for this visit. Podiatric Exam: Vascular: dorsalis pedis and posterior tibial pulses are palpable bilateral. Capillary return is immediate. Temperature gradient is WNL. Skin turgor WNL  Sensorium: Decreased Semmes Weinstein monofilament test.  Decreased tactile sensation bilaterally. Nail Exam: Pt has thick disfigured discolored nails with subungual debris noted bilateral entire nail hallux through fifth toenails.  Pain on palpation to the nails. Ulcer Exam: There is no evidence of ulcer or pre-ulcerative changes or infection. Orthopedic Exam: Muscle tone and strength are WNL. No limitations in general ROM. No crepitus or effusions noted. HAV  B/L.  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 Skin: No Porokeratosis. No infection or ulcers    Assessment & Plan:   1. Pain due to onychomycosis of toenails of both feet   2. Type 2 diabetes mellitus with complication, without long-term current use of insulin (HCC)         Patient was evaluated and treated and all questions answered.  Onychomycosis with pain  -Nails palliatively debrided as below. -Educated on self-care -Penlac was sent for nail fungus.  Advised him apply twice a [place] can take 6 to 8 months to resolve.  He states understanding  Procedure: Nail Debridement Rationale: pain  Type of Debridement: manual, sharp debridement. Instrumentation: Nail nipper, rotary burr. Number of Nails: 10  Procedures and Treatment: Consent by patient was  obtained for treatment procedures. The patient understood the discussion of treatment and procedures well. All questions were answered thoroughly reviewed. Debridement of mycotic and hypertrophic toenails, 1 through 5 bilateral and clearing of subungual debris. No ulceration, no infection noted.  Return Visit-Office Procedure: Patient instructed to return to the office for a follow up visit 3 months for continued evaluation and treatment.  Wauneta Silveria, DPM    No follow-ups on file. 

## 2022-02-14 DIAGNOSIS — M1711 Unilateral primary osteoarthritis, right knee: Secondary | ICD-10-CM | POA: Diagnosis not present

## 2022-03-07 DIAGNOSIS — M170 Bilateral primary osteoarthritis of knee: Secondary | ICD-10-CM | POA: Diagnosis not present

## 2022-03-07 DIAGNOSIS — M25512 Pain in left shoulder: Secondary | ICD-10-CM | POA: Diagnosis not present

## 2022-03-23 ENCOUNTER — Other Ambulatory Visit: Payer: Self-pay | Admitting: Family Medicine

## 2022-03-28 DIAGNOSIS — M25512 Pain in left shoulder: Secondary | ICD-10-CM | POA: Diagnosis not present

## 2022-03-31 ENCOUNTER — Other Ambulatory Visit: Payer: Self-pay | Admitting: Family Medicine

## 2022-04-04 ENCOUNTER — Other Ambulatory Visit: Payer: Self-pay

## 2022-04-04 ENCOUNTER — Inpatient Hospital Stay: Payer: Medicare HMO | Attending: Internal Medicine

## 2022-04-04 ENCOUNTER — Other Ambulatory Visit: Payer: Self-pay | Admitting: Family Medicine

## 2022-04-04 DIAGNOSIS — Z79899 Other long term (current) drug therapy: Secondary | ICD-10-CM | POA: Insufficient documentation

## 2022-04-04 DIAGNOSIS — D696 Thrombocytopenia, unspecified: Secondary | ICD-10-CM | POA: Diagnosis not present

## 2022-04-04 DIAGNOSIS — M1711 Unilateral primary osteoarthritis, right knee: Secondary | ICD-10-CM | POA: Diagnosis not present

## 2022-04-04 DIAGNOSIS — Z87891 Personal history of nicotine dependence: Secondary | ICD-10-CM | POA: Insufficient documentation

## 2022-04-04 DIAGNOSIS — D472 Monoclonal gammopathy: Secondary | ICD-10-CM | POA: Insufficient documentation

## 2022-04-04 LAB — CMP (CANCER CENTER ONLY)
ALT: 46 U/L — ABNORMAL HIGH (ref 0–44)
AST: 29 U/L (ref 15–41)
Albumin: 4.2 g/dL (ref 3.5–5.0)
Alkaline Phosphatase: 29 U/L — ABNORMAL LOW (ref 38–126)
Anion gap: 7 (ref 5–15)
BUN: 18 mg/dL (ref 8–23)
CO2: 25 mmol/L (ref 22–32)
Calcium: 9.6 mg/dL (ref 8.9–10.3)
Chloride: 105 mmol/L (ref 98–111)
Creatinine: 1.25 mg/dL — ABNORMAL HIGH (ref 0.61–1.24)
GFR, Estimated: >60 mL/min (ref >60)
Glucose, Bld: 102 mg/dL — ABNORMAL HIGH (ref 70–99)
Potassium: 4.2 mmol/L (ref 3.5–5.1)
Sodium: 137 mmol/L (ref 135–145)
Total Bilirubin: 0.5 mg/dL (ref 0.3–1.2)
Total Protein: 7.9 g/dL (ref 6.5–8.1)

## 2022-04-04 LAB — CBC WITH DIFFERENTIAL (CANCER CENTER ONLY)
Abs Immature Granulocytes: 0.01 10*3/uL (ref 0.00–0.07)
Basophils Absolute: 0 10*3/uL (ref 0.0–0.1)
Basophils Relative: 0 %
Eosinophils Absolute: 0 10*3/uL (ref 0.0–0.5)
Eosinophils Relative: 1 %
HCT: 35 % — ABNORMAL LOW (ref 39.0–52.0)
Hemoglobin: 12 g/dL — ABNORMAL LOW (ref 13.0–17.0)
Immature Granulocytes: 0 %
Lymphocytes Relative: 38 %
Lymphs Abs: 1.5 10*3/uL (ref 0.7–4.0)
MCH: 29.6 pg (ref 26.0–34.0)
MCHC: 34.3 g/dL (ref 30.0–36.0)
MCV: 86.2 fL (ref 80.0–100.0)
Monocytes Absolute: 0.5 10*3/uL (ref 0.1–1.0)
Monocytes Relative: 11 %
Neutro Abs: 2 10*3/uL (ref 1.7–7.7)
Neutrophils Relative %: 50 %
Platelet Count: 119 10*3/uL — ABNORMAL LOW (ref 150–400)
RBC: 4.06 MIL/uL — ABNORMAL LOW (ref 4.22–5.81)
RDW: 12.7 % (ref 11.5–15.5)
WBC Count: 4 10*3/uL (ref 4.0–10.5)
nRBC: 0 % (ref 0.0–0.2)

## 2022-04-04 LAB — IRON AND IRON BINDING CAPACITY (CC-WL,HP ONLY)
Iron: 372 ug/dL — ABNORMAL HIGH (ref 45–182)
Saturation Ratios: 80 % — ABNORMAL HIGH (ref 17.9–39.5)
TIBC: 465 ug/dL — ABNORMAL HIGH (ref 250–450)
UIBC: 93 ug/dL — ABNORMAL LOW (ref 117–376)

## 2022-04-04 LAB — FERRITIN: Ferritin: 10 ng/mL — ABNORMAL LOW (ref 24–336)

## 2022-04-07 LAB — KAPPA/LAMBDA LIGHT CHAINS
Kappa free light chain: 23 mg/L — ABNORMAL HIGH (ref 3.3–19.4)
Kappa, lambda light chain ratio: 1.06 (ref 0.26–1.65)
Lambda free light chains: 21.6 mg/L (ref 5.7–26.3)

## 2022-04-08 LAB — MULTIPLE MYELOMA PANEL, SERUM
Albumin SerPl Elph-Mcnc: 3.7 g/dL (ref 2.9–4.4)
Albumin/Glob SerPl: 1.1 (ref 0.7–1.7)
Alpha 1: 0.2 g/dL (ref 0.0–0.4)
Alpha2 Glob SerPl Elph-Mcnc: 0.8 g/dL (ref 0.4–1.0)
B-Globulin SerPl Elph-Mcnc: 1 g/dL (ref 0.7–1.3)
Gamma Glob SerPl Elph-Mcnc: 1.6 g/dL (ref 0.4–1.8)
Globulin, Total: 3.5 g/dL (ref 2.2–3.9)
IgA: 98 mg/dL (ref 61–437)
IgG (Immunoglobin G), Serum: 1782 mg/dL — ABNORMAL HIGH (ref 603–1613)
IgM (Immunoglobulin M), Srm: 22 mg/dL (ref 20–172)
M Protein SerPl Elph-Mcnc: 1.1 g/dL — ABNORMAL HIGH
Total Protein ELP: 7.2 g/dL (ref 6.0–8.5)

## 2022-04-11 ENCOUNTER — Other Ambulatory Visit: Payer: Self-pay

## 2022-04-11 ENCOUNTER — Inpatient Hospital Stay: Payer: Medicare HMO | Admitting: Hematology and Oncology

## 2022-04-11 ENCOUNTER — Ambulatory Visit: Payer: Medicare HMO | Admitting: Family Medicine

## 2022-04-11 VITALS — BP 136/80 | HR 99 | Temp 98.1°F | Resp 17 | Wt 195.8 lb

## 2022-04-11 DIAGNOSIS — Z79899 Other long term (current) drug therapy: Secondary | ICD-10-CM | POA: Diagnosis not present

## 2022-04-11 DIAGNOSIS — D472 Monoclonal gammopathy: Secondary | ICD-10-CM

## 2022-04-11 DIAGNOSIS — Z87891 Personal history of nicotine dependence: Secondary | ICD-10-CM | POA: Diagnosis not present

## 2022-04-11 DIAGNOSIS — D500 Iron deficiency anemia secondary to blood loss (chronic): Secondary | ICD-10-CM

## 2022-04-11 DIAGNOSIS — M75102 Unspecified rotator cuff tear or rupture of left shoulder, not specified as traumatic: Secondary | ICD-10-CM | POA: Diagnosis not present

## 2022-04-11 DIAGNOSIS — D696 Thrombocytopenia, unspecified: Secondary | ICD-10-CM | POA: Diagnosis not present

## 2022-04-11 DIAGNOSIS — M1711 Unilateral primary osteoarthritis, right knee: Secondary | ICD-10-CM | POA: Diagnosis not present

## 2022-04-11 NOTE — Progress Notes (Signed)
[place] [phone]   [phone]  PROGRESS NOTE  Patient Care Team: [name], MD as PCP - General (Family Medicine) [name], MD as PCP - Cardiology (Cardiology) Thornton Park, MD as Consulting Physician (Gastroenterology) [name], MD (Inactive) as Consulting Physician (Endocrinology) Orson Slick, MD as Consulting Physician (Hematology and Oncology) Viona Gilmore, [facility] as Pharmacist (Pharmacist)  Hematological/Oncological History # IgG Lambda MGUS    (a) SPEP 02/22/2020 shows an M spike of 1.2%, IFE confirms IgG lambda clonality             (b) kappa lambda ratio 09/26/2020 was normal at 0.93.             (c) 24 hr urine shows the total protein not to be elevated.  Free lambda light chains were 41 mg/L             (d) beta-2 microglobulin 09/26/2020 was normal at 1.8 04/11/2022: establish care with Dr. Lorenso Courier, transition care from Dr. Jana Hakim.   Interval History:  Jerome Vaughn 71 y.o. 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 The patient's last visit was on 10/11/2021 with Dr. Jana Hakim. In the interim since the last visit he has had no major changes in his health.  On exam today Mr. Jicha reports that he has good levels of energy.  He reports his energy is an 8 out of 10.  He has not been having issues with bleeding, bruising, or dark stools.  He notes he takes 81 mg of aspirin daily.  He also takes iron pills every other [place] due to them causing constipation.  He notes that he has not had any issues with his urine and maintains good hydration.  He notes that he is otherwise had no fevers, chills, sweats, nausea, vomiting or diarrhea.  A full 10 point ROS is listed below.  MEDICAL HISTORY:  Past Medical History:  Diagnosis Date   Carpal tunnel syndrome    both hands   Colon polyps    Coronary artery disease    a. Cath  11/21/2015: Multivessel CAD --> CABG recommended. b. CABG on 11/22/2015:  LIMA-LAD, SVG-Diag, SVG-OM1 and distal Cx, and SVG-PDA.   Diabetes mellitus    GERD (gastroesophageal reflux disease)    History of echocardiogram    a. Echo 4/17: Moderate LVH, EF 50-55%, mild LAE, no pericardial effusion   Hyperlipidemia    Hypertension    Pneumonia     SURGICAL HISTORY: Past Surgical History:  Procedure Laterality Date   CARDIAC CATHETERIZATION N/A 11/21/2015   Procedure: Left Heart Cath and Coronary Angiography;  Surgeon: [name], MD;  Location: [facility];  Service: Cardiovascular;  Laterality: N/A;   COLONOSCOPY  02/23/2020   per Dr. Tarri Glenn, tubular adenomas, repeat in 3 yrs    colonsocopy  12/07/2009   per Dr. [name], benign polyps, repeat in 10 yrs    CORONARY ARTERY BYPASS GRAFT N/A 11/22/2015   Procedure: CORONARY ARTERY BYPASS GRAFTING (CABG) times five using the left internal mammary, right greater saphenous vein EVH, and left thigh greater saphenous vein EVH;  Surgeon: [name], MD;  Location: [facility];  Service: Open Heart Surgery;  Laterality: N/A;   frozen shoulder release Right    INTRAVASCULAR PRESSURE WIRE/FFR STUDY N/A 12/14/2018   Procedure: INTRAVASCULAR PRESSURE WIRE/FFR STUDY;  Surgeon: Jettie Booze, MD;  Location: [facility]  INVASIVE [facility];  Service: Cardiovascular;  Laterality: N/A;   LEFT HEART CATH AND CORS/GRAFTS ANGIOGRAPHY N/A 10/27/2017   Procedure: LEFT HEART CATH AND CORS/GRAFTS ANGIOGRAPHY;  Surgeon: [name], MD;  Location: [place] [facility];  Service: Cardiovascular;  Laterality: N/A;   LEFT HEART CATH AND CORS/GRAFTS ANGIOGRAPHY N/A 12/14/2018   Procedure: LEFT HEART CATH AND CORS/GRAFTS ANGIOGRAPHY;  Surgeon: Jettie Booze, MD;  Location: [facility];  Service: Cardiovascular;  Laterality: N/A;   TEE WITHOUT CARDIOVERSION N/A 11/22/2015   Procedure: TRANSESOPHAGEAL ECHOCARDIOGRAM (TEE);  Surgeon: [name], MD;   Location: [place];  Service: Open Heart Surgery;  Laterality: N/A;   WRIST GANGLION EXCISION Right     SOCIAL HISTORY: Social History   Socioeconomic History   Marital status: Married    Spouse name: Not on file   Number of children: 5   Years of education: Not on file   Highest education level: 12th grade  Occupational History   Occupation: Counsellor  Tobacco Use   Smoking status: Former    Packs/Lucchetti: 0.25    Years: 20.00    Total pack years: 5.00    Types: Cigarettes    Quit date: 10/07/2013    Years since quitting: 8.5   Smokeless tobacco: Never   Tobacco comments:    quit cigarettes, might have a cigar 1 X month  Vaping Use   Vaping Use: Never used  Substance and Sexual Activity   Alcohol use: Yes    Alcohol/week: 3.0 - 4.0 standard drinks of alcohol    Types: 3 - 4 Standard drinks or equivalent per week   Drug use: No   Sexual activity: Not Currently    Birth control/protection: None  Other Topics Concern   Not on file  Social History Narrative   Originally from [place], [place]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)   Married   5 kids   6 grand, 2 great grand   Social Determinants of Health   Financial Resource Strain: Low Risk  (04/17/2022)   Overall Financial Resource Strain (CARDIA)    Difficulty of Paying Living Expenses: Not hard at all  Food Insecurity: No Food Insecurity (04/17/2022)   Hunger Vital Sign    Worried About Running Out of Food in the Last Year: Never true    [place] in the Last Year: Never true  Transportation Needs: No Transportation Needs (04/17/2022)   PRAPARE - Hydrologist (Medical): No    Lack of Transportation (Non-Medical): No  Physical Activity: Sufficiently Active (04/17/2022)   Exercise Vital Sign    Days of Exercise per Week: 7 days    Minutes of Exercise per Session: 150+ min  Stress: No Stress Concern Present (04/17/2022)   [place]    Feeling of Stress : Not at all  Social Connections: [place] (04/17/2022)   Social Connection and Isolation Panel [NHANES]    Frequency of Communication with Friends and Family: More than three times a week    Frequency of Social Gatherings with Friends and Family: Three times a week    Attends Religious Services: More than 4 times per year    Active Member of Clubs or Organizations: Yes    Attends Archivist Meetings: More than 4 times per year    Marital Status: Married  Intimate Partner Violence: Not At Risk (11/29/2021)  Humiliation, Afraid, Rape, and Kick questionnaire    Fear of Current or Ex-Partner: No    Emotionally Abused: No    Physically Abused: No    Sexually Abused: No    FAMILY HISTORY: Family History  Problem Relation Age of Onset   Heart failure Father    Anuerysm Brother        AAA   Hyperlipidemia Sister    Diabetes Sister    Hypertension Sister    Kidney failure Sister    Diabetes Maternal Grandmother    Hyperlipidemia Sister    Diabetes Sister    Colon polyps Sister    Kidney disease Sister    Other Brother        COVID 54   Diabetes Son    Heart disease Son    Heart attack Neg Hx    Colon cancer Neg Hx    Stomach cancer Neg Hx    Esophageal cancer Neg Hx     ALLERGIES:  has No Known Allergies.  MEDICATIONS:  Current Outpatient Medications  Medication Sig Dispense Refill   Accu-Chek Softclix Lancets lancets 1 each by Other route daily. Use to monitor glucose levels daily; E11.8 100 each 2   acetaminophen (TYLENOL) 500 MG tablet Take 500 mg by mouth as needed.     APPLE CIDER VINEGAR PO Take by mouth daily.      Ascorbic Acid (VITAMIN C PO) Take 1 tablet by mouth daily.     aspirin EC 81 MG tablet Take 1 tablet (81 mg total) by mouth daily.     benazepril (LOTENSIN) 20 MG tablet TAKE 1 TABLET EVERY [place] 90 tablet 3   Blood Glucose Monitoring Suppl (ACCU-CHEK AVIVA PLUS) w/Device  KIT 1 each by Does not apply route daily. Use to monitor glucose levels daily; E11.8 1 kit 0   bromocriptine (PARLODEL) 2.5 MG tablet Take 1 tablet (2.5 mg total) by mouth daily. 90 tablet 3   carvedilol (COREG) 3.125 MG tablet Take 1 tablet (3.125 mg total) by mouth 2 (two) times daily with a meal. 180 tablet 3   Cinnamon 500 MG capsule Take 1 capsule by mouth daily.     Coenzyme Q10 (COQ-10) 100 MG CAPS Take 1 tablet by mouth daily.     Cyanocobalamin (VITAMIN B-12 PO) Take 1 tablet by mouth daily.     ezetimibe (ZETIA) 10 MG tablet Take 1 tablet (10 mg total) by mouth daily. 90 tablet 3   ferrous sulfate 325 (65 FE) MG tablet Take 325 mg by mouth daily with breakfast.     Garlic 1856 MG CAPS Take 1 tablet by mouth daily.     glucose blood (ACCU-CHEK AVIVA PLUS) test strip 1 each by Other route daily. Use to monitor glucose levels daily; E11.8 100 each 2   metFORMIN (GLUCOPHAGE-XR) 500 MG 24 hr tablet Take 4 tablets (2,000 mg total) by mouth daily. 360 tablet 3   Multiple Vitamin (MULTIVITAMIN) tablet Take 1 tablet by mouth daily.     nitroGLYCERIN (NITROSTAT) 0.4 MG SL tablet Place 1 tablet (0.4 mg total) under the tongue every 5 (five) minutes as needed for chest pain. 75 tablet 1   Omega-3 Fatty Acids (FISH OIL PO) Take 1 capsule by mouth daily.     pantoprazole (PROTONIX) 40 MG tablet Take 1 tablet (40 mg total) by mouth 2 (two) times daily. 180 tablet 3   ranolazine (RANEXA) 500 MG 12 hr tablet Take 1 tablet (500 mg total) by mouth 2 (two)  times daily. 180 tablet 3   repaglinide (PRANDIN) 2 MG tablet Take 1 tablet (2 mg total) by mouth 2 (two) times daily before a meal. 180 tablet 3   rosuvastatin (CRESTOR) 40 MG tablet TAKE 1 TABLET EVERY [place] 90 tablet 1   tadalafil (CIALIS) 20 MG tablet Take 1 tablet (20 mg total) by mouth as needed for erectile dysfunction. 20 tablet 11   VITAMIN E PO Take 1 capsule by mouth daily.     No current facility-administered medications for this visit.     REVIEW OF SYSTEMS:   Constitutional: ( - ) fevers, ( - )  chills , ( - ) night sweats Eyes: ( - ) blurriness of vision, ( - ) double vision, ( - ) watery eyes Ears, nose, mouth, throat, and face: ( - ) mucositis, ( - ) sore throat Respiratory: ( - ) cough, ( - ) dyspnea, ( - ) wheezes Cardiovascular: ( - ) palpitation, ( - ) chest discomfort, ( - ) lower extremity swelling Gastrointestinal:  ( - ) nausea, ( - ) heartburn, ( - ) change in bowel habits Skin: ( - ) abnormal skin rashes Lymphatics: ( - ) new lymphadenopathy, ( - ) easy bruising Neurological: ( - ) numbness, ( - ) tingling, ( - ) new weaknesses Behavioral/Psych: ( - ) mood change, ( - ) new changes  All other systems were reviewed with the patient and are negative.  PHYSICAL EXAMINATION: ECOG PERFORMANCE STATUS: 0 - Asymptomatic  Vitals:   04/11/22 1407  BP: 136/80  Pulse: 99  Resp: 17  Temp: 98.1 F (36.7 C)  SpO2: 92%   Filed Weights   04/11/22 1407  Weight: 195 lb 12.8 oz (88.8 kg)    GENERAL: Well-appearing elderly African-American male, alert, no distress and comfortable SKIN: skin color, texture, turgor are normal, no rashes or significant lesions EYES: conjunctiva are pink and non-injected, sclera clear LUNGS: clear to auscultation and percussion with normal breathing effort HEART: regular rate & rhythm and no murmurs and no lower extremity edema Musculoskeletal: no cyanosis of digits and no clubbing  PSYCH: alert & oriented x 3, fluent speech NEURO: no focal motor/sensory deficits  LABORATORY DATA:  I have reviewed the data as listed    Latest Ref Rng & Units 04/04/2022    2:07 PM 10/04/2021    2:07 PM 09/06/2021    9:15 AM  CBC  WBC 4.0 - 10.5 K/uL 4.0  5.0  3.7   Hemoglobin 13.0 - 17.0 g/dL 12.0  14.0  13.2   Hematocrit 39.0 - 52.0 % 35.0  40.5  39.5   Platelets 150 - 400 K/uL 119  158  148.0        Latest Ref Rng & Units 04/04/2022    2:07 PM 10/04/2021    2:07 PM 09/06/2021    9:15 AM  CMP   Glucose 70 - 99 mg/dL 102  134  188   BUN 8 - 23 mg/dL _0 Creatinine 0.61 - 1.24 mg/dL 1.25  1.29  1.37   Sodium 135 - 145 mmol/L 137  136  134   Potassium 3.5 - 5.1 mmol/L 4.2  4.5  4.9   Chloride 98 - 111 mmol/L 105  104  100   CO2 22 - 32 mmol/L _1 Calcium 8.9 - 10.3 mg/dL 9.6  9.8  9.4   Total Protein 6.5 - 8.1 g/dL 7.9  8.4  7.6   Total Bilirubin 0.3 - 1.2 mg/dL 0.5  0.5  0.5   Alkaline Phos 38 - 126 U/L 29  36  32   AST 15 - 41 U/L 29  33  25   ALT 0 - 44 U/L 46  41  33     Lab Results  Component Value Date   MPROTEIN 1.1 (H) 04/04/2022   MPROTEIN 1.3 (H) 10/04/2021   MPROTEIN 1.1 (H) 04/10/2021   Lab Results  Component Value Date   KPAFRELGTCHN 23.0 (H) 04/04/2022   KPAFRELGTCHN 29.2 (H) 10/04/2021   KPAFRELGTCHN 19.8 (H) 04/10/2021   LAMBDASER 21.6 04/04/2022   LAMBDASER 25.9 10/04/2021   LAMBDASER 22.3 04/10/2021   KAPLAMBRATIO 1.06 04/04/2022   KAPLAMBRATIO 1.13 10/04/2021   KAPLAMBRATIO 0.89 04/10/2021   RADIOGRAPHIC STUDIES: No results found.  ASSESSMENT & PLAN Tu E Mansour 71 y.o. 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   #IgG Lambda Monoclonal Gammopathy of Undetermined Significance --today will order an SPEP, SFLC and beta 2 microglobulin --additionally will collect new baseline CBC, CMP, and LDH --recommend a metastatic bone survey to assess for lytic lesions at next visit.  --will consider the need for a bone marrow biopsy pending the above results --RTC in 6 months time or sooner if needed based on labs.   #Thrombocytopenia/Anemia -- Worsened from prior.  Hemoglobin today 12.0 with platelets of 119 --We will plan to check his labs again in 3 months time in order sure this is not worsening.   No orders of the defined types were placed in this encounter.   All questions were answered. The patient knows to call the clinic with  any problems, questions or concerns.  A total of more than 40 minutes were spent on this encounter with face-to-face time and non-face-to-face time, including preparing to see the patient, ordering tests and/or medications, counseling the patient and coordination of care as outlined above.   Ledell Peoples, MD Department of Hematology/Oncology [place] [facility] Phone: [phone] Pager: [phone] Email: [email or web address]  04/19/2022 5:11 PM

## 2022-04-18 ENCOUNTER — Ambulatory Visit (INDEPENDENT_AMBULATORY_CARE_PROVIDER_SITE_OTHER): Payer: Medicare HMO | Admitting: Family Medicine

## 2022-04-18 ENCOUNTER — Encounter: Payer: Self-pay | Admitting: Family Medicine

## 2022-04-18 ENCOUNTER — Ambulatory Visit: Payer: Medicare HMO | Admitting: Family Medicine

## 2022-04-18 VITALS — BP 132/80 | HR 74 | Temp 98.8°F | Wt 188.0 lb

## 2022-04-18 DIAGNOSIS — G8929 Other chronic pain: Secondary | ICD-10-CM | POA: Diagnosis not present

## 2022-04-18 DIAGNOSIS — N521 Erectile dysfunction due to diseases classified elsewhere: Secondary | ICD-10-CM | POA: Diagnosis not present

## 2022-04-18 DIAGNOSIS — M179 Osteoarthritis of knee, unspecified: Secondary | ICD-10-CM | POA: Insufficient documentation

## 2022-04-18 DIAGNOSIS — I10 Essential (primary) hypertension: Secondary | ICD-10-CM | POA: Diagnosis not present

## 2022-04-18 DIAGNOSIS — D508 Other iron deficiency anemias: Secondary | ICD-10-CM

## 2022-04-18 DIAGNOSIS — D472 Monoclonal gammopathy: Secondary | ICD-10-CM

## 2022-04-18 DIAGNOSIS — M1711 Unilateral primary osteoarthritis, right knee: Secondary | ICD-10-CM

## 2022-04-18 DIAGNOSIS — M25512 Pain in left shoulder: Secondary | ICD-10-CM | POA: Diagnosis not present

## 2022-04-18 MED ORDER — TADALAFIL 20 MG PO TABS: 20.0000 mg | ORAL_TABLET | ORAL | 11 refills | Status: DC | PRN

## 2022-04-18 NOTE — Progress Notes (Signed)
   Subjective:    Patient ID: Jerome Vaughn, male    DOB: 1951-03-21, 71 y.o.   MRN: [id number]  HPI Here to follow up on iron deficiency anemia, HTN, and ED. He had labs drawn in Oncology on 04-04-22, and he saw Dr. Lorenso Courier on 04-11-22. After his iron infusion his Hgb is up to 12.0 and iron is up to 372, although ferritin remains low at 10. The WBC was 4.0 and the platelets were at 119 K. Dr. Lorenso Courier had prescribed him to take 325 mg ferrous sulfate tablets one a [place], but these caused constipation so he stopped them. Instead he has some OTC iron liquid that he takes daily. He tolerates  this better. He does not recall the iron content however. Otherwise his monoclonal gammopathy of undetermined significance seems to be stable. He ask for refills on Sildenafil so he can get more at a time. He is seeing Dr. 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 He also has a torn rotator cuff and a bone spur in the left shoulder, and he recently received a steroid injection there.    Review of Systems  Constitutional: Negative.   Respiratory: Negative.    Cardiovascular: Negative.   Musculoskeletal:  Positive for arthralgias.       Objective:   Physical Exam Constitutional:      Appearance: Normal appearance.  Cardiovascular:     Rate and Rhythm: Normal rate and regular rhythm.     Pulses: Normal pulses.     Heart sounds: Normal heart sounds.  Pulmonary:     Effort: Pulmonary effort is normal.     Breath sounds: Normal breath sounds.  Neurological:     Mental Status: He is alert.           Assessment & Plan:  He will follow up Dr. Lorenso Courier for the iron deficiency anemia and the MGUS. I asked him to check on the iron content of the liquid he is taking and to get this information back to [place]. He will follow up with Dr. Theda Sers about the knee and shoulder pain. His HTN is well controlled. For the ED, we will prescribe #20 Sildenafil at a time. We spent a total of (  31  ) minutes reviewing records and discussing these issues.  Alysia Penna, MD

## 2022-04-19 ENCOUNTER — Encounter: Payer: Self-pay | Admitting: Oncology

## 2022-04-25 ENCOUNTER — Other Ambulatory Visit (INDEPENDENT_AMBULATORY_CARE_PROVIDER_SITE_OTHER): Payer: Medicare HMO

## 2022-04-25 ENCOUNTER — Other Ambulatory Visit: Payer: Self-pay | Admitting: Endocrinology

## 2022-04-25 DIAGNOSIS — E118 Type 2 diabetes mellitus with unspecified complications: Secondary | ICD-10-CM

## 2022-04-25 LAB — MICROALBUMIN / CREATININE URINE RATIO
Creatinine,U: 296.9 mg/dL
Microalb Creat Ratio: 1.2 mg/g (ref 0.0–30.0)
Microalb, Ur: 3.5 mg/dL — ABNORMAL HIGH (ref 0.0–1.9)

## 2022-04-25 LAB — HEMOGLOBIN A1C: Hgb A1c MFr Bld: 9.2 % — ABNORMAL HIGH (ref 4.6–6.5)

## 2022-04-25 LAB — GLUCOSE, RANDOM: Glucose, Bld: 218 mg/dL — ABNORMAL HIGH (ref 70–99)

## 2022-05-02 ENCOUNTER — Encounter: Payer: Medicare HMO | Admitting: Endocrinology

## 2022-05-02 NOTE — Progress Notes (Signed)
This encounter was created in error - please disregard.

## 2022-05-08 ENCOUNTER — Other Ambulatory Visit: Payer: Self-pay | Admitting: Family Medicine

## 2022-05-08 ENCOUNTER — Telehealth: Payer: Self-pay | Admitting: Pharmacist

## 2022-05-08 NOTE — Chronic Care Management (AMB) (Signed)
    Chronic Care Management Pharmacy Assistant   Name: Jerome Vaughn  MRN: [id number] DOB: [birth year]  05/09/2022 APPOINTMENT REMINDER  Called Jerome Vaughn, No answer, left message of appointment on 05/09/2022 at 8:30 via telephone visit with Jeni Salles, Pharm D. Notified to have all medications, supplements, blood pressure and/or blood sugar logs available during appointment and to return call if need to reschedule.  Care Gaps: AWV - scheduled 12/02/2021 Last BP - 132/80 on 04/18/2022 Last A1C - 9.2 on 04/25/2022 Eye exam - overdue Pneumonia vaccine - overdue Covid booster - overdue Flu - due  Star Rating Drug: Benazepril 20 mg - last filled 03/03/2022 90 DS at [facility] Metformin 500 mg - last filled 01/16/2022 90 DS at [facility] verified with Hassan Rowan Repaglinide 2 mg - last filled 04/24/2022 90 DS at [facility] Rosuvastatin 40 mg - last filled 04/07/2022 90 DS at [facility]  Any gaps in medications fill history? Yes  [place] Pharmacist Assistant [phone]

## 2022-05-08 NOTE — Progress Notes (Signed)
Chronic Care Management Pharmacy Note  05/19/2022 Name:  Jerome Vaughn MRN:  [id number] DOB:  [birth year]  Summary: A1c not at goal < 7% LDL at goal < 55 BP not quite at goal < 130/80  Recommendations/Changes made from today's visit: -Recommended fructosamine level given recent low hemoglobin -Requested refill of ranolazine to [facility] to cut down on cost -Recommended routine BP monitoring and bringing cuff to next office visit to ensure accuracy  Plan: DM assessment in 2 months CCM follow up in 4 months   Subjective: Jerome Vaughn is an 71 y.o. year Vaughn male who is a primary patient of [name], MD.  The CCM team was consulted for assistance with disease management and care coordination needs.    Engaged with patient by telephone for follow up visit in response to provider referral for pharmacy case management and/or care coordination services.   Consent to Services:  The patient was given information about Chronic Care Management services, agreed to services, and gave verbal consent prior to initiation of services.  Please see initial visit note for detailed documentation.   Patient Care Team: [name], MD as PCP - General (Family Medicine) [name], MD as PCP - Cardiology (Cardiology) Thornton Park, MD as Consulting Physician (Gastroenterology) [name], MD (Inactive) as Consulting Physician (Endocrinology) Orson Slick, MD as Consulting Physician (Hematology and Oncology) Viona Gilmore, [facility] as Pharmacist (Pharmacist)  Recent office visits: 04/18/22 Alysia Penna MD - Patient was seen for chronic conditions follow up.  11/29/2021 Rolene Arbour LPN - Medicare annual wellness exam.   Recent consult visits: 04/18/22 Elizabeth Sauer, PA-C ([facility]): Patient presented for arthritis of knee follow up.  04/11/22 Narda Rutherford, MD (hematology/oncology): Patient presented for iron deficiency anemia.   01/31/22 [name], DPM (podiatry):  Patient presented for diabetic nail trim.  01/24/22 [name], MD (endo): Patient presented for DM follow up. Increased repaglinide to 2 mg BID.  10/11/21 Wilber Bihari, NP (oncology): Patient presented for MGUS follow up.   Hospital visits: None in previous 6 months   Objective:  Lab Results  Component Value Date   CREATININE 1.25 (H) 04/04/2022   BUN 18 04/04/2022   GFR 53.15 (L) 09/06/2021   GFRNONAA >60 04/04/2022   GFRAA >60 06/14/2020   NA 137 04/04/2022   K 4.2 04/04/2022   CALCIUM 9.6 04/04/2022   CO2 25 04/04/2022   GLUCOSE 218 (H) 04/25/2022    Lab Results  Component Value Date/Time   HGBA1C 9.2 (H) 04/25/2022 11:19 AM   HGBA1C 7.3 (A) 01/24/2022 08:38 AM   HGBA1C 8.4 (A) 10/25/2021 08:28 AM   HGBA1C 8.0 (H) 09/06/2021 09:15 AM   GFR 53.15 (L) 09/06/2021 09:15 AM   GFR 81.97 04/11/2019 09:24 AM   MICROALBUR 3.5 (H) 04/25/2022 11:19 AM   MICROALBUR 1.2 08/22/2014 10:34 AM    Last diabetic Eye exam:  Lab Results  Component Value Date/Time   HMDIABEYEEXA No Retinopathy 09/18/2020 12:00 AM    Last diabetic Foot exam: No results found for: "HMDIABFOOTEX"   Lab Results  Component Value Date   CHOL 131 09/06/2021   HDL 54.70 09/06/2021   LDLCALC 53 09/06/2021   LDLDIRECT 204.8 09/05/2013   TRIG 114.0 09/06/2021   CHOLHDL 2 09/06/2021       Latest Ref Rng & Units 04/04/2022    2:07 PM 10/04/2021    2:07 PM 09/06/2021    9:15 AM  Hepatic Function  Total Protein  6.5 - 8.1 g/dL 7.9  8.4  7.6   Albumin 3.5 - 5.0 g/dL 4.2  4.4  4.3   AST 15 - 41 U/L 29  33  25   ALT 0 - 44 U/L 46  41  33   Alk Phosphatase 38 - 126 U/L 29  36  32   Total Bilirubin 0.3 - 1.2 mg/dL 0.5  0.5  0.5   Bilirubin, Direct 0.0 - 0.3 mg/dL   0.1     Lab Results  Component Value Date/Time   TSH 0.78 09/06/2021 09:15 AM   TSH 0.55 08/07/2020 09:06 AM       Latest Ref Rng & Units 04/04/2022    2:07 PM 10/04/2021    2:07 PM 09/06/2021    9:15 AM  CBC  WBC 4.0 - 10.5 K/uL 4.0   5.0  3.7   Hemoglobin 13.0 - 17.0 g/dL 12.0  14.0  13.2   Hematocrit 39.0 - 52.0 % 35.0  40.5  39.5   Platelets 150 - 400 K/uL 119  158  148.0     No results found for: "VD25OH"  Clinical ASCVD: Yes  The 10-year ASCVD risk score (Arnett DK, et al., 2019) is: 28.8%   Values used to calculate the score:     Age: 71 years     Sex: Male     Is Non-Hispanic African American: Yes     Diabetic: Yes     Tobacco smoker: No     Systolic Blood Pressure: 371 mmHg     Is BP treated: Yes     HDL Cholesterol: 54.7 mg/dL     Total Cholesterol: 131 mg/dL       04/18/2022   11:22 AM 11/29/2021    9:55 AM 08/30/2021    2:52 PM  Depression screen PHQ 2/9  Decreased Interest 0 0 0  Down, Depressed, Hopeless 0 0 0  PHQ - 2 Score 0 0 0  Altered sleeping 0  0  Tired, decreased energy 0  0  Change in appetite 0  0  Feeling bad or failure about yourself  0  0  Trouble concentrating 0  0  Moving slowly or fidgety/restless 0  0  Suicidal thoughts 0  0  PHQ-9 Score 0  0  Difficult doing work/chores Not difficult at all  Not difficult at all     Social History   Tobacco Use  Smoking Status Former   Packs/Klosowski: 0.25   Years: 20.00   Total pack years: 5.00   Types: Cigarettes   Quit date: 10/07/2013   Years since quitting: 8.6  Smokeless Tobacco Never  Tobacco Comments   quit cigarettes, might have a cigar 1 X month   BP Readings from Last 3 Encounters:  04/18/22 132/80  04/11/22 136/80  01/24/22 (!) 144/98   Pulse Readings from Last 3 Encounters:  04/18/22 74  04/11/22 99  01/24/22 88   Wt Readings from Last 3 Encounters:  04/18/22 188 lb (85.3 kg)  04/11/22 195 lb 12.8 oz (88.8 kg)  01/24/22 199 lb 12.8 oz (90.6 kg)   BMI Readings from Last 3 Encounters:  04/18/22 29.44 kg/m  04/11/22 30.67 kg/m  01/24/22 31.29 kg/m    Assessment/Interventions: Review of patient past medical history, allergies, medications, health status, including review of consultants reports, laboratory  and other test data, was performed as part of comprehensive evaluation and provision of chronic care management services.   SDOH:  (Social Determinants of Health) assessments  and interventions performed: Yes SDOH Interventions    Flowsheet Row Most Recent Value  SDOH Interventions   Financial Strain Interventions Other (Comment)  [working on sending medications to cheaper pharmacy]      SDOH Screenings   Alcohol Screen: Low Risk  (04/17/2022)   Alcohol Screen    Last Alcohol Screening Score (AUDIT): 2  Depression (PHQ2-9): Low Risk  (04/18/2022)   Depression (PHQ2-9)    PHQ-2 Score: 0  Financial Resource Strain: Low Risk  (05/19/2022)   Overall Financial Resource Strain (CARDIA)    Difficulty of Paying Living Expenses: Not very hard  Food Insecurity: No Food Insecurity (04/17/2022)   Hunger Vital Sign    Worried About Running Out of Food in the Last Year: Never true    Ran Out of Food in the Last Year: Never true  Housing: Low Risk  (04/17/2022)   Housing    Last Housing Risk Score: 0  Physical Activity: Sufficiently Active (04/17/2022)   Exercise Vital Sign    Days of Exercise per Week: 7 days    Minutes of Exercise per Session: 150+ min  Social Connections: Socially Integrated (04/17/2022)   Social Connection and Isolation Panel [NHANES]    Frequency of Communication with Friends and Family: More than three times a week    Frequency of Social Gatherings with Friends and Family: Three times a week    Attends Religious Services: More than 4 times per year    Active Member of Clubs or Organizations: Yes    Attends Archivist Meetings: More than 4 times per year    Marital Status: Married  Stress: No Stress Concern Present (04/17/2022)   [place]    Feeling of Stress : Not at all  Tobacco Use: Medium Risk (04/18/2022)   Patient History    Smoking Tobacco Use: Former    Smokeless Tobacco Use: Never     Passive Exposure: Not on file  Transportation Needs: No Transportation Needs (04/17/2022)   PRAPARE - Hydrologist (Medical): No    Lack of Transportation (Non-Medical): No    CCM Care Plan  No Known Allergies  Medications Reviewed Today     Reviewed by [name], MD (Physician) on 04/18/22 at 1125  Med List Status: <None>   Medication Order Taking? Sig Documenting Provider Last Dose Status Informant  Accu-Chek Softclix Lancets lancets [id number] Yes 1 each by Other route daily. Use to monitor glucose levels daily; E11.8 [name], MD Taking Active   acetaminophen (TYLENOL) 500 MG tablet [id number] Yes Take 500 mg by mouth as needed. [provider] Taking Active   APPLE CIDER VINEGAR PO [id number] Yes Take by mouth daily.  [provider] Taking Active   Ascorbic Acid (VITAMIN C PO) [id number] Yes Take 1 tablet by mouth daily. [provider] Taking Active Self  aspirin EC 81 MG tablet [id number] Yes Take 1 tablet (81 mg total) by mouth daily. Burtis Junes, NP Taking Active Self           Med Note Rosana Hoes, SOPHIA A   Tue Jan 24, 2020 11:44 AM)    benazepril (LOTENSIN) 20 MG tablet [id number] Yes TAKE 1 TABLET EVERY [place] [name], MD Taking Active   Blood Glucose Monitoring Suppl (ACCU-CHEK AVIVA PLUS) w/Device KIT [id number] Yes 1 each by Does not apply route daily. Use to monitor glucose levels daily; E11.8 [name], MD Taking Active  bromocriptine (PARLODEL) 2.5 MG tablet [id number] Yes Take 1 tablet (2.5 mg total) by mouth daily. [name], MD Taking Active   carvedilol (COREG) 3.125 MG tablet [id number] Yes Take 1 tablet (3.125 mg total) by mouth 2 (two) times daily with a meal. [name], MD Taking Active   Cinnamon 500 MG capsule [id number] Yes Take 1 capsule by mouth daily. [provider] Taking Active   Coenzyme Q10 (COQ-10) 100 MG CAPS [id number] Yes Take 1 tablet by mouth daily. [provider] Taking Active   Cyanocobalamin (VITAMIN B-12 PO) [id number] Yes Take 1 tablet by mouth daily. [provider] Taking Active Self  ezetimibe (ZETIA) 10 MG tablet [id number] Yes Take 1 tablet (10 mg total) by mouth daily. [name], MD Taking Active   ferrous sulfate 325 (65 FE) MG tablet [id number] Yes Take 325 mg by mouth daily with breakfast. [provider] Taking Active Self  Garlic 6803 MG CAPS [id number] Yes Take 1 tablet by mouth daily. [provider] Taking Active   glucose blood (ACCU-CHEK AVIVA PLUS) test strip [id number] Yes 1 each by Other route daily. Use to monitor glucose levels daily; E11.8 [name], MD Taking Active   metFORMIN (GLUCOPHAGE-XR) 500 MG 24 hr tablet [id number] Yes Take 4 tablets (2,000 mg total) by mouth daily. [name], MD Taking Active   Multiple Vitamin (MULTIVITAMIN) tablet [id number] Yes Take 1 tablet by mouth daily. [provider] Taking Active Self  nitroGLYCERIN (NITROSTAT) 0.4 MG SL tablet [id number] Yes Place 1 tablet (0.4 mg total) under the tongue every 5 (five) minutes as needed for chest pain. [name], MD Taking Active            Med Note Louretta Shorten, Emmaline Kluver   Thu Jan 26, 2020  8:14 AM)    Omega-3 Fatty Acids (FISH OIL PO) [id number] Yes Take 1 capsule by mouth daily. [provider] Taking Active   pantoprazole (PROTONIX) 40 MG tablet [id number] Yes Take 1 tablet (40 mg total) by mouth 2 (two) times daily. Thornton Park, MD Taking Active   ranolazine (RANEXA) 500 MG 12 hr tablet [id number] Yes Take 1 tablet (500 mg total) by mouth 2 (two) times daily. [name], MD Taking Active   repaglinide (PRANDIN) 2 MG tablet [id number] Yes Take 1 tablet (2 mg total) by mouth 2 (two) times daily before a meal. [name], MD Taking Active   rosuvastatin (CRESTOR) 40 MG tablet [id number] Yes TAKE 1 TABLET EVERY [place] [name], MD Taking Active   tadalafil (CIALIS) 20 MG tablet [id number]  Yes Take 1 tablet (20 mg total) by mouth as needed for erectile dysfunction. [name], MD Taking Active   VITAMIN E PO [id number] Yes Take 1 capsule by mouth daily. [provider] Taking Active             Patient Active Problem List   Diagnosis Date Noted   OA (osteoarthritis) of knee 04/18/2022   Left shoulder pain 04/18/2022   GERD (gastroesophageal reflux disease)    Bacterial infection due to H. 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   Abnormal stress test 11/21/2015   Coronary artery disease involving coronary bypass graft of native heart with angina pectoris (HCC)    Nonspecific abnormal electrocardiogram (ECG) (EKG) 11/07/2015   Hypertension 03/30/2011   Hyperlipidemia 03/30/2011   Tobacco use disorder 03/30/2011   Colon polyp 03/30/2011    Immunization History  Administered Date(s) Administered   Fluad Quad(high Dose 65+) 08/02/2020, 08/30/2021   Influenza, High Dose Seasonal PF 05/30/2019   Influenza,inj,Quad PF,6+ Mos 08/22/2014, 12/03/2016, 11/23/2017   Influenza-Unspecified 08/29/2013, 07/30/2015, 08/02/2016, 10/04/2018, 10/07/2018, 05/12/2019   PFIZER(Purple Top)SARS-COV-2 Vaccination 11/11/2019, 12/05/2019, 06/28/2020, 02/05/2021, 01/31/2022   Pfizer Covid-19 Vaccine Bivalent Booster 92yr & up 06/21/2021   Pneumococcal Conjugate-13 08/27/2015   Pneumococcal Polysaccharide-23 02/09/2017   Td (Adult),unspecified 11/03/2021   Tdap 03/28/2011,  11/03/2021   Zoster Recombinat (Shingrix) 11/26/2018, 05/31/2019   He does not qualify for patient assistance for any medications because him and his wife continue to work full time and their income is above the limits.  Patient reports his shoulder is giving his some trouble. He got a cortisone shot in his shoulder and he got a gel shot in his right knee and we discussed that this may have affected his blood sugars.  Patient just got the iron liquid this past Monday and is taking ferrous sulfate liquid 200 mg/58m1.5 teaspoons to get close to the recommended dose. Patient doesn't eat a lot of red meat and was mostly eating seafood. He is working on adding more vegetables with iron.   Patient was surprised about his A1c and was checking readings twice a [place]. Patient had one week of higher readings after his cortisone shot and then it came back down to normal, which is why he did not expect his A1c to be so high.  He missed his appt with endo because he had a flight delayed. Patient will reschedule his appt with endo.  Conditions to be addressed/monitored:  Hypertension, Hyperlipidemia, Diabetes, Heart Failure, Coronary Artery Disease, GERD, and Tobacco use  Conditions addressed this visit: Hypertension, Diabetes, CAD  Care Plan : CCM Pharmacy Care Plan  Updates made by PrViona GilmoreRPSpringbrookince 05/19/2022 12:00 AM     Problem: Problem: Hypertension, Hyperlipidemia, Diabetes, Heart Failure, Coronary Artery Disease, GERD, and Tobacco use      Long-Range Goal: Patient-Specific Goal   Start Date: 11/08/2021  Expected End Date: 11/08/2022  Recent Progress: On track  Priority: High  Note:   Current Barriers:  Unable to independently afford treatment regimen Unable to maintain control of diabetes Suboptimal therapeutic regimen for diabetes  Pharmacist Clinical Goal(s):  Patient will verbalize ability to afford treatment regimen achieve adherence to monitoring guidelines and medication  adherence to achieve therapeutic efficacy maintain control of diabetes as evidenced by A1c  through collaboration with PharmD and provider.   Interventions: 1:1 collaboration with FrLaurey MoraleMD regarding development and update of comprehensive plan of care as evidenced by provider attestation and co-signature Inter-disciplinary care team collaboration (see longitudinal plan of care) Comprehensive medication review performed; medication list updated in electronic medical record  Hypertension (BP goal <130/80) -Not ideally controlled -Current treatment: Benazepril 20 mg 1 tablet daily - Appropriate, Query effective, Safe, Accessible Carvedilol 3.125 mg 1 tablet twice daily - Appropriate, Query effective, Safe, Accessible -Medications previously tried: n/a  -Current home readings: has a wrist cuff - does not check often -Current dietary habits:  limits salt intake -Current exercise habits: active with work and walking after work -Denies hypotensive/hypertensive symptoms -Educated on BP goals and benefits of medications for prevention of heart attack, stroke and kidney damage; Importance of home blood pressure monitoring; Proper BP monitoring technique; -Counseled to monitor BP at home weekly, document, and provide log at future appointments -Counseled on diet and exercise extensively Recommended to continue current medication  Hyperlipidemia: (LDL goal < 55) -Controlled -Current treatment: Ezetimibe 10 mg 1 tablet daily - Appropriate, Effective, Safe, Accessible Rosuvastatin 40 mg 1 tablet daily  - Appropriate, Effective, Safe, Accessible -Medications previously tried: none  -Current dietary patterns: did not discuss -Current exercise habits: active at work and walking after work -Educated on [employer];  Benefits of statin for ASCVD risk reduction; Importance of limiting foods high in cholesterol; -Counseled on diet and exercise extensively Recommended to continue  current medication  Diabetes (A1c goal <7%) -Uncontrolled -Current medications: Bromocriptine 2.5 mg 1 tablet daily - Query Appropriate, Query effective, Safe, Query accessible Repaglinide 1 mg 2 tablets twice daily before a meal - Query Appropriate, Query effective, Safe, Accessible Metformin XR 500 mg 4 tablets daily (not always taking 4 per [place]) - Appropriate, Query effective, Safe, Accessible -Medications previously tried: Geneticist, molecular (cost), pioglitazone (edema), semaglutide (insurance formulary), Januvia (unknown), Farxiga (insurance changed to [place]) -Current home glucose readings fasting glucose: 180 (highest), 129, 130, 144; pt had one week of higher readings (296 at 830 in AM, 225, 357, 325) but then back to normal post prandial glucose: 160 before bedtime (average) -Denies hypoglycemic/hyperglycemic symptoms -Current meal patterns:  breakfast: light breakfast (5am) - doesn't skip because of repaglinide; boiled egg and toast and juice; morning star instead of bacon and sausage lunch: noon; more fruits and vegetables with diet dinner: 6 pm; has cut back on bread and eating chicken and fish; salads more snacks: has cut back on heavy foods drinks: no soda; juice  & water, coffee (splenda) & tea (splenda zero) -Current exercise: walking after work and 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  CAD (Goal: prevent heart events) -Controlled -Current treatment  Aspirin 81 mg 1 tablet daily - Appropriate, Effective, Safe, Accessible Ranolazine 500 mg 1 tablet twice daily  - Appropriate, Effective, Safe, Query accessible -Medications previously tried: n/a  -Collaborated with cardiology to send ranolazine to [facility] for lower price.  GERD (Goal: minimize symptoms) -Controlled -Current treatment  Pantoprazole 40 mg 1 tablet daily - Appropriate, Effective, Safe, Accessible -Medications previously tried: none  - Reassess the need at follow up.  Health Maintenance -Vaccine gaps: none -Current therapy:  Multivitamin daily Garlic 9622 mg 1 capsule daily Vitamin C daily Ciclopirox apply as directed Cinnamon 500 mg 1 capsule daily CoQ10 100 mg 1 capsule daily Vitamin B12 daily Ferrous sulfate 325 mg 1 tablet daily Garlic 2979 mg 1 capsule daily Tadalafil 20 mg 1 tablet daily Vitamin E daily -Educated on Herbal supplement research is limited and benefits usually cannot be proven Cost vs benefit of each product must be carefully weighed by individual consumer Supplements may interfere with prescription drugs -Patient is satisfied with current therapy and  denies issues - Reassess supplements at follow up.  Patient Goals/Self-Care Activities Patient will:  - check glucose twice daily, document, and provide at future appointments check blood pressure weekly, document, and provide at future appointments target a minimum of 150 minutes of moderate intensity exercise weekly  Follow Up Plan: The care management team will reach out to the patient again over the next 30 days.         Medication Assistance:  Does not qualify for patient assistance at this time.  Compliance/Adherence/Medication fill history: Care Gaps: COVID booster, Prevnar20, eye exam, influenza Last BP - 132/80 on 04/18/2022 Last A1C - 9.2 on 04/25/2022  Star-Rating Drugs: Benazepril 20 mg - last filled 03/03/2022 90 DS at [facility] Metformin 500 mg - last filled 01/16/2022 90 DS at [facility] verified with Hassan Rowan Repaglinide 2 mg - last filled 04/24/2022 90 DS at [facility] Rosuvastatin 40 mg - last filled 04/07/2022 90 DS at [facility]  Patient's preferred pharmacy is:  [id number] - [address] [place] [address] Phone: [phone] Fax: [phone]  [place], [place] [address] Phone: [phone] Fax: [phone]  (Inactive see NCPDP [id number]) [place], [place] [address] [place] [address] Phone: [phone] Fax: [place], [place] [address] [place] [address] [place] [address] Phone: [phone] Fax: [phone]  Uses pill box? Yes Pt endorses 100% compliance  We discussed: Current pharmacy is preferred with insurance plan and patient is satisfied with pharmacy services Patient decided to: Continue current medication management strategy  Care Plan and Follow Up Patient Decision:  Patient agrees to Care Plan and Follow-up.  Plan: The care management team will reach out to the patient again over the next 30 days.  Jeni Salles, PharmD, [place] Pharmacist [place] [facility]

## 2022-05-09 ENCOUNTER — Ambulatory Visit (INDEPENDENT_AMBULATORY_CARE_PROVIDER_SITE_OTHER): Payer: Medicare HMO | Admitting: Podiatry

## 2022-05-09 ENCOUNTER — Ambulatory Visit (INDEPENDENT_AMBULATORY_CARE_PROVIDER_SITE_OTHER): Payer: Medicare HMO | Admitting: Pharmacist

## 2022-05-09 DIAGNOSIS — M79675 Pain in left toe(s): Secondary | ICD-10-CM

## 2022-05-09 DIAGNOSIS — B351 Tinea unguium: Secondary | ICD-10-CM

## 2022-05-09 DIAGNOSIS — I10 Essential (primary) hypertension: Secondary | ICD-10-CM

## 2022-05-09 DIAGNOSIS — M79674 Pain in right toe(s): Secondary | ICD-10-CM

## 2022-05-09 DIAGNOSIS — E118 Type 2 diabetes mellitus with unspecified complications: Secondary | ICD-10-CM | POA: Diagnosis not present

## 2022-05-09 NOTE — Telephone Encounter (Signed)
Medication currently not on list. Last OV- 04/18/22 Last office note on 04/18/22, was patient to continue medication.   No future OV scheduled.

## 2022-05-09 NOTE — Progress Notes (Signed)
  Subjective:  Patient ID: Jerome Vaughn, male    DOB: [birth year],  MRN: [id number]  Chief Complaint  Patient presents with   Nail Problem    Nail trim   71 y.o. male returns for the above complaint.  Patient presents with complaint of thickened elongated dystrophic toenails x10.  They are painful to touch.  Patient is a diabetic with last A1c of 9.8.  He is not able to do it himself.  He would like for me to debride them now.  He denies any other acute complaints.  Objective:  There were no vitals filed for this visit. Podiatric Exam: Vascular: dorsalis pedis and posterior tibial pulses are palpable bilateral. Capillary return is immediate. Temperature gradient is WNL. Skin turgor WNL  Sensorium: Decreased Semmes Weinstein monofilament test.  Decreased tactile sensation bilaterally. Nail Exam: Pt has thick disfigured discolored nails with subungual debris noted bilateral entire nail hallux through fifth toenails.  Pain on palpation to the nails. Ulcer Exam: There is no evidence of ulcer or pre-ulcerative changes or infection. Orthopedic Exam: Muscle tone and strength are WNL. No limitations in general ROM. No crepitus or effusions noted. HAV  B/L.  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 Skin: No Porokeratosis. No infection or ulcers    Assessment & Plan:   1. Pain due to onychomycosis of toenails of both feet   2. Type 2 diabetes mellitus with complication, without long-term current use of insulin ([place])         Patient was evaluated and treated and all questions answered.  Onychomycosis with pain  -Nails palliatively debrided as below. -Educated on self-care -Penlac was sent for nail fungus.  Advised him apply twice a [place] can take 6 to 8 months to resolve.  He states understanding  Procedure: Nail Debridement Rationale: pain  Type of Debridement: manual, sharp debridement. Instrumentation: Nail nipper, rotary burr. Number of Nails: 10  Procedures and Treatment: Consent by patient was  obtained for treatment procedures. The patient understood the discussion of treatment and procedures well. All questions were answered thoroughly reviewed. Debridement of mycotic and hypertrophic toenails, 1 through 5 bilateral and clearing of subungual debris. No ulceration, no infection noted.  Return Visit-Office Procedure: Patient instructed to return to the office for a follow up visit 3 months for continued evaluation and treatment.  [name], DPM    No follow-ups on file.

## 2022-05-12 ENCOUNTER — Telehealth: Payer: Self-pay | Admitting: Family Medicine

## 2022-05-12 DIAGNOSIS — E118 Type 2 diabetes mellitus with unspecified complications: Secondary | ICD-10-CM

## 2022-05-12 DIAGNOSIS — E785 Hyperlipidemia, unspecified: Secondary | ICD-10-CM

## 2022-05-12 NOTE — Telephone Encounter (Signed)
Done

## 2022-05-19 NOTE — Patient Instructions (Signed)
Hi Jerome Vaughn,  It was great to catch up with you again! Please make sure to call your new endocrinologist to reschedule your missed visit.  Please reach out to me if you have any questions or need anything before our follow up! I scheduled you for 4 months out this time so we can make sure your blood sugars are doing better.  Best, Maddie  Jeni Salles, PharmD, [facility]   Visit Information   Goals Addressed   None    Patient Care Plan: CCM Pharmacy Care Plan     Problem Identified: Problem: Hypertension, Hyperlipidemia, Diabetes, Heart Failure, Coronary Artery Disease, GERD, and Tobacco use      Long-Range Goal: Patient-Specific Goal   Start Date: 11/08/2021  Expected End Date: 11/08/2022  Recent Progress: On track  Priority: High  Note:   Current Barriers:  Unable to independently afford treatment regimen Unable to maintain control of diabetes Suboptimal therapeutic regimen for diabetes  Pharmacist Clinical Goal(s):  Patient will verbalize ability to afford treatment regimen achieve adherence to monitoring guidelines and medication adherence to achieve therapeutic efficacy maintain control of diabetes as evidenced by A1c  through collaboration with PharmD and provider.   Interventions: 1:1 collaboration with [name], MD regarding development and update of comprehensive plan of care as evidenced by provider attestation and co-signature Inter-disciplinary care team collaboration (see longitudinal plan of care) Comprehensive medication review performed; medication list updated in electronic medical record  Hypertension (BP goal <130/80) -Not ideally controlled -Current treatment: Benazepril 20 mg 1 tablet daily - Appropriate, Query effective, Safe, Accessible Carvedilol 3.125 mg 1 tablet twice daily - Appropriate, Query effective, Safe, Accessible -Medications previously tried: n/a  -Current home  readings: has a wrist cuff - does not check often -Current dietary habits: limits salt intake -Current exercise habits: active with work and walking after work -Denies hypotensive/hypertensive symptoms -Educated on BP goals and benefits of medications for prevention of heart attack, stroke and kidney damage; Importance of home blood pressure monitoring; Proper BP monitoring technique; -Counseled to monitor BP at home weekly, document, and provide log at future appointments -Counseled on diet and exercise extensively Recommended to continue current medication  Hyperlipidemia: (LDL goal < 55) -Controlled -Current treatment: Ezetimibe 10 mg 1 tablet daily - Appropriate, Effective, Safe, Accessible Rosuvastatin 40 mg 1 tablet daily  - Appropriate, Effective, Safe, Accessible -Medications previously tried: none  -Current dietary patterns: did not discuss -Current exercise habits: active at work and walking after work -Educated on [employer];  Benefits of statin for ASCVD risk reduction; Importance of limiting foods high in cholesterol; -Counseled on diet and exercise extensively Recommended to continue current medication  Diabetes (A1c goal <7%) -Uncontrolled -Current medications: Bromocriptine 2.5 mg 1 tablet daily - Query Appropriate, Query effective, Safe, Query accessible Repaglinide 1 mg 2 tablets twice daily before a meal - Query Appropriate, Query effective, Safe, Accessible Metformin XR 500 mg 4 tablets daily (not always taking 4 per [place]) - Appropriate, Query effective, Safe, Accessible -Medications previously tried: Geneticist, molecular (cost), pioglitazone ([place]), semaglutide (insurance formulary), Januvia (unknown), Farxiga (insurance changed to [place]) -Current home glucose readings fasting glucose: 180 (highest), 129, 130, 144; pt had one week of higher readings (296 at 830 in AM, 225, 357, 325) but then back to normal post prandial glucose: 160 before bedtime  (average) -Denies hypoglycemic/hyperglycemic symptoms -Current meal patterns:  breakfast: light breakfast (5am) - doesn't skip because of repaglinide; boiled egg and toast and juice; morning  star instead of bacon and sausage lunch: noon; more fruits and vegetables with diet dinner: 6 pm; has cut back on bread and eating chicken and fish; salads more snacks: has cut back on heavy foods drinks: no soda; juice  & water, coffee (splenda) & tea (splenda zero) -Current exercise: walking after work and 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  CAD (Goal: prevent heart events) -Controlled -Current treatment  Aspirin 81 mg 1 tablet daily - Appropriate, Effective, Safe, Accessible Ranolazine 500 mg 1 tablet twice daily - Appropriate, Effective, Safe, Query accessible -Medications previously tried: n/a  -Collaborated with cardiology to send ranolazine to [facility] for lower price.  GERD (Goal: minimize symptoms) -Controlled -Current treatment  Pantoprazole 40 mg 1 tablet daily - Appropriate, Effective, Safe, Accessible -Medications previously tried: none  - Reassess the need at follow up.  Health Maintenance -Vaccine gaps: none -Current therapy:  Multivitamin daily Garlic 2585 mg 1 capsule daily Vitamin C daily Ciclopirox apply as directed Cinnamon 500 mg 1 capsule daily CoQ10 100 mg 1 capsule daily Vitamin B12 daily Ferrous sulfate 325 mg 1 tablet daily Garlic 2778 mg 1 capsule daily Tadalafil 20 mg 1 tablet daily Vitamin E daily -Educated on Herbal  supplement research is limited and benefits usually cannot be proven Cost vs benefit of each product must be carefully weighed by individual consumer Supplements may interfere with prescription drugs -Patient is satisfied with current therapy and denies issues - Reassess supplements at follow up.  Patient Goals/Self-Care Activities Patient will:  - check glucose twice daily, document, and provide at future appointments check blood pressure weekly, document, and provide at future appointments target a minimum of 150 minutes of moderate intensity exercise weekly  Follow Up Plan: The care management team will reach out to the patient again over the next 30 days.        Patient verbalizes understanding of instructions and care plan provided today and agrees to view in [place]. Active MyChart status and patient understanding of how to access instructions and care plan via MyChart confirmed with patient.    The pharmacy team will reach out to the patient again over the next 30 days.   Viona Gilmore, [facility]

## 2022-05-20 ENCOUNTER — Telehealth: Payer: Self-pay | Admitting: Pharmacist

## 2022-05-20 MED ORDER — RANOLAZINE ER 500 MG PO TB12: 500.0000 mg | ORAL_TABLET | Freq: Two times a day (BID) | ORAL | 1 refills | Status: DC

## 2022-05-20 NOTE — Telephone Encounter (Signed)
Pt's medication was sent to pt's pharmacy as requested. Confirmation received.  °

## 2022-05-20 NOTE — Telephone Encounter (Signed)
Mr. Loomer is requesting for a refill of his ranolazine to be sent to [facility] (which has been added to his list of pharmacies) as this will be much cheaper for him than his local pharmacy. The refill needs to have in the notes to pharmacy his email address which is: [email or web address] .  Routing to cardiology to send in the refill.

## 2022-05-29 DIAGNOSIS — I2510 Atherosclerotic heart disease of native coronary artery without angina pectoris: Secondary | ICD-10-CM

## 2022-05-29 DIAGNOSIS — I10 Essential (primary) hypertension: Secondary | ICD-10-CM

## 2022-05-29 DIAGNOSIS — E1159 Type 2 diabetes mellitus with other circulatory complications: Secondary | ICD-10-CM | POA: Diagnosis not present

## 2022-05-29 DIAGNOSIS — E785 Hyperlipidemia, unspecified: Secondary | ICD-10-CM

## 2022-06-06 DIAGNOSIS — M1711 Unilateral primary osteoarthritis, right knee: Secondary | ICD-10-CM | POA: Diagnosis not present

## 2022-06-29 DIAGNOSIS — E119 Type 2 diabetes mellitus without complications: Secondary | ICD-10-CM | POA: Diagnosis not present

## 2022-07-07 ENCOUNTER — Other Ambulatory Visit: Payer: Self-pay

## 2022-07-07 DIAGNOSIS — E118 Type 2 diabetes mellitus with unspecified complications: Secondary | ICD-10-CM

## 2022-07-07 MED ORDER — BROMOCRIPTINE MESYLATE 2.5 MG PO TABS: 2.5000 mg | ORAL_TABLET | Freq: Every day | ORAL | 3 refills | Status: DC

## 2022-07-11 ENCOUNTER — Inpatient Hospital Stay: Payer: Medicare HMO | Attending: Internal Medicine

## 2022-07-11 ENCOUNTER — Other Ambulatory Visit: Payer: Self-pay | Admitting: Hematology and Oncology

## 2022-07-11 DIAGNOSIS — Z7982 Long term (current) use of aspirin: Secondary | ICD-10-CM | POA: Insufficient documentation

## 2022-07-11 DIAGNOSIS — D472 Monoclonal gammopathy: Secondary | ICD-10-CM | POA: Insufficient documentation

## 2022-07-11 DIAGNOSIS — D696 Thrombocytopenia, unspecified: Secondary | ICD-10-CM | POA: Insufficient documentation

## 2022-07-11 DIAGNOSIS — D649 Anemia, unspecified: Secondary | ICD-10-CM | POA: Insufficient documentation

## 2022-07-11 LAB — CBC WITH DIFFERENTIAL (CANCER CENTER ONLY)
Abs Immature Granulocytes: 0.01 10*3/uL (ref 0.00–0.07)
Basophils Absolute: 0 10*3/uL (ref 0.0–0.1)
Basophils Relative: 1 %
Eosinophils Absolute: 0.1 10*3/uL (ref 0.0–0.5)
Eosinophils Relative: 1 %
HCT: 36.2 % — ABNORMAL LOW (ref 39.0–52.0)
Hemoglobin: 12.2 g/dL — ABNORMAL LOW (ref 13.0–17.0)
Immature Granulocytes: 0 %
Lymphocytes Relative: 26 %
Lymphs Abs: 1 10*3/uL (ref 0.7–4.0)
MCH: 29 pg (ref 26.0–34.0)
MCHC: 33.7 g/dL (ref 30.0–36.0)
MCV: 86.2 fL (ref 80.0–100.0)
Monocytes Absolute: 0.5 10*3/uL (ref 0.1–1.0)
Monocytes Relative: 12 %
Neutro Abs: 2.3 10*3/uL (ref 1.7–7.7)
Neutrophils Relative %: 60 %
Platelet Count: 126 10*3/uL — ABNORMAL LOW (ref 150–400)
RBC: 4.2 MIL/uL — ABNORMAL LOW (ref 4.22–5.81)
RDW: 12.9 % (ref 11.5–15.5)
WBC Count: 3.8 10*3/uL — ABNORMAL LOW (ref 4.0–10.5)
nRBC: 0 % (ref 0.0–0.2)

## 2022-07-11 LAB — CMP (CANCER CENTER ONLY)
ALT: 24 U/L (ref 0–44)
AST: 27 U/L (ref 15–41)
Albumin: 4.2 g/dL (ref 3.5–5.0)
Alkaline Phosphatase: 33 U/L — ABNORMAL LOW (ref 38–126)
Anion gap: 7 (ref 5–15)
BUN: 17 mg/dL (ref 8–23)
CO2: 25 mmol/L (ref 22–32)
Calcium: 8.9 mg/dL (ref 8.9–10.3)
Chloride: 103 mmol/L (ref 98–111)
Creatinine: 1.17 mg/dL (ref 0.61–1.24)
GFR, Estimated: >60 mL/min (ref >60)
Glucose, Bld: 202 mg/dL — ABNORMAL HIGH (ref 70–99)
Potassium: 4.6 mmol/L (ref 3.5–5.1)
Sodium: 135 mmol/L (ref 135–145)
Total Bilirubin: 0.4 mg/dL (ref 0.3–1.2)
Total Protein: 8 g/dL (ref 6.5–8.1)

## 2022-07-11 LAB — LACTATE DEHYDROGENASE: LDH: 214 U/L — ABNORMAL HIGH (ref 98–192)

## 2022-07-12 LAB — BETA 2 MICROGLOBULIN, SERUM: Beta-2 Microglobulin: 1.6 mg/L (ref 0.6–2.4)

## 2022-07-14 LAB — KAPPA/LAMBDA LIGHT CHAINS
Kappa free light chain: 22.7 mg/L — ABNORMAL HIGH (ref 3.3–19.4)
Kappa, lambda light chain ratio: 1 (ref 0.26–1.65)
Lambda free light chains: 22.7 mg/L (ref 5.7–26.3)

## 2022-07-16 LAB — MULTIPLE MYELOMA PANEL, SERUM
Albumin SerPl Elph-Mcnc: 3.8 g/dL (ref 2.9–4.4)
Albumin/Glob SerPl: 1.1 (ref 0.7–1.7)
Alpha 1: 0.2 g/dL (ref 0.0–0.4)
Alpha2 Glob SerPl Elph-Mcnc: 0.8 g/dL (ref 0.4–1.0)
B-Globulin SerPl Elph-Mcnc: 0.9 g/dL (ref 0.7–1.3)
Gamma Glob SerPl Elph-Mcnc: 1.7 g/dL (ref 0.4–1.8)
Globulin, Total: 3.6 g/dL (ref 2.2–3.9)
IgA: 90 mg/dL (ref 61–437)
IgG (Immunoglobin G), Serum: 1941 mg/dL — ABNORMAL HIGH (ref 603–1613)
IgM (Immunoglobulin M), Srm: 25 mg/dL (ref 20–172)
M Protein SerPl Elph-Mcnc: 1.1 g/dL — ABNORMAL HIGH
Total Protein ELP: 7.4 g/dL (ref 6.0–8.5)

## 2022-07-23 ENCOUNTER — Telehealth: Payer: Self-pay | Admitting: Pharmacist

## 2022-07-23 NOTE — Chronic Care Management (AMB) (Signed)
Chronic Care Management Pharmacy Assistant   Name: Jerome Vaughn  MRN: [id number] DOB: 10/13/1950  Reason for Encounter: Disease State / Diabetes Assessment Call   Conditions to be addressed/monitored: DMII  Recent office visits:  None  Recent consult visits:  06/06/2022 Jerome Vaughn ([facility]) - Patient was seen for Osteoarthritis of right knee joint. No additional chart notes.   Hospital visits:  None  Medications: Outpatient Encounter Medications as of 07/23/2022  Medication Sig   Accu-Chek Softclix Lancets lancets 1 each by Other route daily. Use to monitor glucose levels daily; E11.8   acetaminophen (TYLENOL) 500 MG tablet Take 500 mg by mouth as needed.   APPLE CIDER VINEGAR PO Take by mouth daily.    Ascorbic Acid (VITAMIN C PO) Take 1 tablet by mouth daily.   aspirin EC 81 MG tablet Take 1 tablet (81 mg total) by mouth daily.   benazepril (LOTENSIN) 20 MG tablet TAKE 1 TABLET EVERY Jerome Vaughn   Blood Glucose Monitoring Suppl (ACCU-CHEK AVIVA PLUS) w/Device KIT 1 each by Does not apply route daily. Use to monitor glucose levels daily; E11.8   bromocriptine (PARLODEL) 2.5 MG tablet Take 1 tablet (2.5 mg total) by mouth daily.   carvedilol (COREG) 3.125 MG tablet Take 1 tablet (3.125 mg total) by mouth 2 (two) times daily with a meal.   Cinnamon 500 MG capsule Take 1 capsule by mouth daily.   Coenzyme Q10 (COQ-10) 100 MG CAPS Take 1 tablet by mouth daily.   Cyanocobalamin (VITAMIN B-12 PO) Take 1 tablet by mouth daily.   ezetimibe (ZETIA) 10 MG tablet Take 1 tablet (10 mg total) by mouth daily.   ferrous sulfate 220 (44 Fe) MG/5ML solution Take 300 mg by mouth daily. Taking 1.5 teaspoons per Jerome Vaughn = 300 mg   ferrous sulfate 325 (65 FE) MG tablet Take 325 mg by mouth daily with breakfast.   Garlic 9244 MG CAPS Take 1 tablet by mouth daily.   glucose blood (ACCU-CHEK AVIVA PLUS) test strip 1 each by Other route daily. Use to monitor glucose levels daily; E11.8   metFORMIN  (GLUCOPHAGE-XR) 500 MG 24 hr tablet Take 4 tablets (2,000 mg total) by mouth daily.   Multiple Vitamin (MULTIVITAMIN) tablet Take 1 tablet by mouth daily.   nitroGLYCERIN (NITROSTAT) 0.4 MG SL tablet Place 1 tablet (0.4 mg total) under the tongue every 5 (five) minutes as needed for chest pain.   Omega-3 Fatty Acids (FISH OIL PO) Take 1 capsule by mouth daily.   pantoprazole (PROTONIX) 40 MG tablet Take 1 tablet (40 mg total) by mouth 2 (two) times daily.   ranolazine (RANEXA) 500 MG 12 hr tablet Take 1 tablet (500 mg total) by mouth 2 (two) times daily.   repaglinide (PRANDIN) 2 MG tablet Take 1 tablet (2 mg total) by mouth 2 (two) times daily before a meal.   rosuvastatin (CRESTOR) 40 MG tablet TAKE 1 TABLET EVERY Jerome Vaughn   sildenafil (VIAGRA) 100 MG tablet TAKE ONE TABLET BY MOUTH DAILY AS NEEDED FOR ERECTILE DYSFUNCTION   tadalafil (Jerome Vaughn) 20 MG tablet Take 1 tablet (20 mg total) by mouth as needed for erectile dysfunction.   VITAMIN E PO Take 1 capsule by mouth daily.   No facility-administered encounter medications on file as of 07/23/2022.  Fill History: benazepril 20 mg tablet 05/17/2022 90   bromocriptine 2.5 mg tablet 07/09/2022 90   ezetimibe 10 mg tablet 05/17/2022 90   meloxicam 15 mg tablet 06/20/2022 30   metformin  ER 500 mg tablet,extended release 24 hr 01/16/2022 90   ranolazine (RANEXA) 12 hr tablet 05/20/2022 90   repaglinide 2 mg tablet 04/24/2022 90   rosuvastatin 40 mg tablet 04/07/2022 90   Recent Relevant Labs: Lab Results  Component Value Date/Time   HGBA1C 9.2 (H) 04/25/2022 11:19 AM   HGBA1C 7.3 (A) 01/24/2022 08:38 AM   HGBA1C 8.4 (A) 10/25/2021 08:28 AM   HGBA1C 8.0 (H) 09/06/2021 09:15 AM   MICROALBUR 3.5 (H) 04/25/2022 11:19 AM   MICROALBUR 1.2 08/22/2014 10:34 AM    Kidney Function Lab Results  Component Value Date/Time   CREATININE 1.17 07/11/2022 10:21 AM   CREATININE 1.25 (H) 04/04/2022 02:07 PM   CREATININE 1.19 11/14/2015 11:35 AM   GFR  53.15 (L) 09/06/2021 09:15 AM   GFRNONAA >60 07/11/2022 10:21 AM   GFRAA >60 06/14/2020 02:19 PM    Current antihyperglycemic regimen:  Metformin 500 mg 4 tablets daily Ranolazine 500 mg twice daily  What recent interventions/DTPs have been made to improve glycemic control: No recent interventions  Have there been any recent hospitalizations or ED visits since last visit with CPP? No recent hospital visits  Patient denies hypoglycemic symptoms, including None  Patient denies hyperglycemic symptoms, including none  How often are you checking your blood sugar? Patient states he is checking blood sugars twice daily before breakfast and after dinner.   What are your blood sugars ranging?  Fasting: 10/27/223-164, 07/24/22-128, 07/23/22-149 After meals: 07/24/22-213, 07/23/22-134, 07/22/22-117  During the week, how often does your blood glucose drop below 70? Patient states his blood sugar has never dropped below 70.   Are you checking your feet daily/regularly? Patient states he is checking regularly and also goes to his podiatrist regularly.  Adherence Review: Is the patient currently on a STATIN medication? Yes Is the patient currently on ACE/ARB medication? Yes Does the patient have >5 [place] gap between last estimated fill dates? Yes   Care Gaps: AWV - scheduled 12/03/2022 Last BP - 132/80 on 04/18/2022 Last A1C - 9.2 on 04/25/2022 Eye exam - overdue Pnuemonia - overdue Covid - overdue Flu - due Foot exam - overdue  Star Rating Drugs: Benazepril 20 mg - last filled 05/17/2022 90 DS at [facility] Metformin 500 mg - last filled 01/16/2022 90 at [facility] verified with Alinda Sierras Repaglinide 2 mg - last filled 04/24/2022 90 DS at [facility] Rosuvastatin 40 mg - last filled 04/08/2022 90 DS at [facility] verified with Dorris Pharmacist Assistant [phone]

## 2022-07-24 ENCOUNTER — Telehealth: Payer: Self-pay | Admitting: *Deleted

## 2022-07-24 NOTE — Telephone Encounter (Signed)
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  We will see him back in January as scheduled. ----- Message ----- From: Buel Ream, Lab In [place] Sent: 07/11/2022  10:32 AM EDT To: Orson Slick, MD

## 2022-07-24 NOTE — Telephone Encounter (Signed)
TCT patient regarding recent lab results.  No answer but was able to leave vm message on an identified phone vm. Advised that his labs are stable. We will see him back in January at his scheduled appt. Provided date and time.

## 2022-07-30 ENCOUNTER — Telehealth: Payer: Self-pay

## 2022-07-30 MED ORDER — ROSUVASTATIN CALCIUM 40 MG PO TABS: 40.0000 mg | ORAL_TABLET | Freq: Every day | ORAL | 1 refills | Status: DC

## 2022-07-30 NOTE — Telephone Encounter (Signed)
-----   Message from Viona Gilmore, [facility] sent at 07/30/2022  2:33 PM EDT ----- Regarding: Rosuvastatin refill Hi,  Can you please send in a refill of his rosuvastatin to his mail order pharmacy?  Thanks! Maddie

## 2022-08-15 ENCOUNTER — Ambulatory Visit: Payer: Medicare HMO | Admitting: Podiatry

## 2022-08-15 DIAGNOSIS — M79674 Pain in right toe(s): Secondary | ICD-10-CM

## 2022-08-15 DIAGNOSIS — B351 Tinea unguium: Secondary | ICD-10-CM | POA: Diagnosis not present

## 2022-08-15 DIAGNOSIS — M79675 Pain in left toe(s): Secondary | ICD-10-CM | POA: Diagnosis not present

## 2022-08-15 DIAGNOSIS — E118 Type 2 diabetes mellitus with unspecified complications: Secondary | ICD-10-CM

## 2022-08-15 NOTE — Progress Notes (Signed)
  Subjective:  Patient ID: Jerome Vaughn, male    DOB: 15-Jul-1951,  MRN: [id number]  Chief Complaint  Patient presents with   Nail Problem    Nail trim    71 y.o. male returns for the above complaint.  Patient presents with complaint of thickened elongated dystrophic toenails x10.  They are painful to touch.  Patient is a diabetic with last A1c of 9.8.  He is not able to do it himself.  He would like for me to debride them now.  He denies any other acute complaints.  Objective:  There were no vitals filed for this visit. Podiatric Exam: Vascular: dorsalis pedis and posterior tibial pulses are palpable bilateral. Capillary return is immediate. Temperature gradient is WNL. Skin turgor WNL  Sensorium: Decreased Semmes Weinstein monofilament test.  Decreased tactile sensation bilaterally. Nail Exam: Pt has thick disfigured discolored nails with subungual debris noted bilateral entire nail hallux through fifth toenails.  Pain on palpation to the nails. Ulcer Exam: There is no evidence of ulcer or pre-ulcerative changes or infection. Orthopedic Exam: Muscle tone and strength are WNL. No limitations in general ROM. No crepitus or effusions noted. HAV  B/L.  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 Skin: No Porokeratosis. No infection or ulcers    Assessment & Plan:   1. Pain due to onychomycosis of toenails of both feet   2. Type 2 diabetes mellitus with complication, without long-term current use of insulin ([place])         Patient was evaluated and treated and all questions answered.  Onychomycosis with pain  -Nails palliatively debrided as below. -Educated on self-care -Penlac was sent for nail fungus.  Advised him apply twice a [place] can take 6 to 8 months to resolve.  He states understanding  Procedure: Nail Debridement Rationale: pain  Type of Debridement: manual, sharp debridement. Instrumentation: Nail nipper, rotary burr. Number of Nails: 10  Procedures and Treatment: Consent by patient was  obtained for treatment procedures. The patient understood the discussion of treatment and procedures well. All questions were answered thoroughly reviewed. Debridement of mycotic and hypertrophic toenails, 1 through 5 bilateral and clearing of subungual debris. No ulceration, no infection noted.  Return Visit-Office Procedure: Patient instructed to return to the office for a follow up visit 3 months for continued evaluation and treatment.  [name], DPM    No follow-ups on file.

## 2022-09-05 ENCOUNTER — Other Ambulatory Visit: Payer: Self-pay | Admitting: Endocrinology

## 2022-09-05 ENCOUNTER — Encounter: Payer: Self-pay | Admitting: Cardiovascular Disease

## 2022-09-05 ENCOUNTER — Ambulatory Visit: Payer: Medicare HMO | Attending: Cardiovascular Disease | Admitting: Cardiovascular Disease

## 2022-09-05 ENCOUNTER — Telehealth: Payer: Self-pay

## 2022-09-05 VITALS — BP 128/78 | HR 80 | Ht 67.0 in | Wt 199.6 lb

## 2022-09-05 DIAGNOSIS — I25708 Atherosclerosis of coronary artery bypass graft(s), unspecified, with other forms of angina pectoris: Secondary | ICD-10-CM

## 2022-09-05 DIAGNOSIS — N521 Erectile dysfunction due to diseases classified elsewhere: Secondary | ICD-10-CM | POA: Diagnosis not present

## 2022-09-05 DIAGNOSIS — E7800 Pure hypercholesterolemia, unspecified: Secondary | ICD-10-CM

## 2022-09-05 DIAGNOSIS — I5042 Chronic combined systolic (congestive) and diastolic (congestive) heart failure: Secondary | ICD-10-CM | POA: Diagnosis not present

## 2022-09-05 DIAGNOSIS — E118 Type 2 diabetes mellitus with unspecified complications: Secondary | ICD-10-CM

## 2022-09-05 DIAGNOSIS — N182 Chronic kidney disease, stage 2 (mild): Secondary | ICD-10-CM | POA: Diagnosis not present

## 2022-09-05 DIAGNOSIS — I10 Essential (primary) hypertension: Secondary | ICD-10-CM

## 2022-09-05 MED ORDER — EMPAGLIFLOZIN 10 MG PO TABS: 10.0000 mg | ORAL_TABLET | Freq: Every day | ORAL | 3 refills | Status: DC

## 2022-09-05 MED ORDER — ACCU-CHEK AVIVA PLUS W/DEVICE KIT: 1.0000 | PACK | Freq: Every day | 0 refills | Status: DC

## 2022-09-05 NOTE — Patient Instructions (Signed)
Medication Instructions:  Start Jardiance 10 mg ( Take 1 Tablet Daily). *If you need a refill on your cardiac medications before your next appointment, please call your pharmacy*   Lab Work: No Labs If you have labs (blood work) drawn today and your tests are completely normal, you will receive your results only by: [place] (if you have MyChart) OR A paper copy in the mail If you have any lab test that is abnormal or we need to change your treatment, we will call you to review the results.   Testing/Procedures: No Testing   Follow-Up: At [facility], you and your health needs are our priority.  As part of our continuing mission to provide you with exceptional heart care, we have created designated Provider Care Teams.  These Care Teams include your primary Cardiologist (physician) and Advanced Practice Providers (APPs -  Physician Assistants and Nurse Practitioners) who all work together to provide you with the care you need, when you need it.  We recommend signing up for the patient portal called "MyChart".  Sign up information is provided on this After Visit Summary.  MyChart is used to connect with patients for Virtual Visits (Telemedicine).  Patients are able to view lab/test results, encounter notes, upcoming appointments, etc.  Non-urgent messages can be sent to your provider as well.   To learn more about what you can do with MyChart, go to [email or web address].    Your next appointment:   1 year(s)  The format for your next appointment:   In Person  Provider:   Sanda Klein, MD

## 2022-09-05 NOTE — Telephone Encounter (Signed)
Patient called requesting call back from Dr Dwyane Dee assistant. Tried calling patient back no answer Vm left

## 2022-09-05 NOTE — Progress Notes (Signed)
Cardiology Office Note:    Date:  09/05/2022   ID:  Jerome Vaughn, DOB [birth year], MRN [id number]  PCP:  [name], MD   [facility] Providers Cardiologist:  Sinclair Grooms, MD     Referring MD: [name], MD   Chief Complaint  Patient presents with   Coronary Artery Disease   Congestive Heart Failure  Establishing follow-up with new cardiologist, in anticipation of Dr. Thompson Caul retirement  History of Present Illness:    Jerome Vaughn is a 71 y.o. male with a hx of coronary artery disease (CABGx4 2017, known occlusion of SVG-Diag, patent LIMA-LAD, SVG-OM, SVG-RCA by cath 2020), left ventricular systolic dysfunction (most recent LVEF 35-45% by angiography in 2020), type 2 diabetes mellitus, hypercholesterolemia, essential hypertension, bilateral carpal tunnel syndrome, presenting for routine follow-up.  He is generally doing quite well and has NYHA functional class I status.  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  On current medical management he does not have exertional dyspnea, exertional angina, orthopnea, PND, palpitations, dizziness or syncope.  He does report occasional twinges in his left chest but these are brief and occur without activity.  Prior to his bypass surgery his angina was a sensation of precordial tightness that would occur when he would walk across a large parking lot and persist for 6-7 minutes when he rested.  He denies focal neurological complaints or intermittent claudication.  He has chronic mild swelling in the right lower extremity where he had vein harvesting.  He has erectile dysfunction that responds well to PDE 5 inhibitors.  Most recent cardiac catheterization from 2020 showed LVEF 35-45%.  By echo the EF was estimated to be 09-38% and diastolic parameters were deemed to be "indeterminate", but on my review of the echo there is clear evidence of pseudo normal mitral filling pattern with elevated  mean left atrial pressure, consistent with diastolic heart failure.  His most recent lipid profile from a year ago showed excellent HDL and LDL cholesterol levels.  Glycemic control this year has been worse than usual with a hemoglobin A1c of 9.2%, but he has received a few cortisone shots for shoulder issues.  In the past he took Jardiance without side effects, but had to stop it due to change in insurance providers.  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  To date there have been no cardiac manifestations to suggest amyloidosis.  He takes bromocriptine, which was prescribed by Dr. [name], but does not know for which indication.  I find no evidence of a pituitary mass on the MRI of the brain performed 2010 or other indication that he had a prolactinoma.  His new endocrinologist will be Dr. Kelton Pillar.   Past Medical History:  Diagnosis Date   Carpal tunnel syndrome    both hands   Colon polyps    Coronary artery disease    a. Cath 11/21/2015: Multivessel CAD --> CABG recommended. b. CABG on 11/22/2015:  LIMA-LAD, SVG-Diag, SVG-OM1 and distal Cx, and SVG-PDA.   Diabetes mellitus    GERD (gastroesophageal reflux disease)    History of echocardiogram    a. Echo 4/17: Moderate LVH, EF 50-55%, mild LAE, no pericardial effusion   Hyperlipidemia    Hypertension    Pneumonia     Past Surgical History:  Procedure Laterality Date   CARDIAC CATHETERIZATION N/A 11/21/2015  Procedure: Left Heart Cath and Coronary Angiography;  Surgeon: [name], MD;  Location: [facility];  Service: Cardiovascular;  Laterality: N/A;   COLONOSCOPY  02/23/2020   per Dr. Tarri Glenn, tubular adenomas, repeat in 3 yrs    colonsocopy  12/07/2009   per Dr. [name], benign polyps, repeat in 10 yrs    CORONARY ARTERY BYPASS GRAFT N/A 11/22/2015   Procedure: CORONARY ARTERY BYPASS GRAFTING (CABG) times five using  the left internal mammary, right greater saphenous vein EVH, and left thigh greater saphenous vein EVH;  Surgeon: [name], MD;  Location: [facility];  Service: Open Heart Surgery;  Laterality: N/A;   frozen shoulder release Right    INTRAVASCULAR PRESSURE WIRE/FFR STUDY N/A 12/14/2018   Procedure: INTRAVASCULAR PRESSURE WIRE/FFR STUDY;  Surgeon: Jettie Booze, MD;  Location: [facility];  Service: Cardiovascular;  Laterality: N/A;   LEFT HEART CATH AND CORS/GRAFTS ANGIOGRAPHY N/A 10/27/2017   Procedure: LEFT HEART CATH AND CORS/GRAFTS ANGIOGRAPHY;  Surgeon: [name], MD;  Location: [facility];  Service: Cardiovascular;  Laterality: N/A;   LEFT HEART CATH AND CORS/GRAFTS ANGIOGRAPHY N/A 12/14/2018   Procedure: LEFT HEART CATH AND CORS/GRAFTS ANGIOGRAPHY;  Surgeon: Jettie Booze, MD;  Location: [facility];  Service: Cardiovascular;  Laterality: N/A;   TEE WITHOUT CARDIOVERSION N/A 11/22/2015   Procedure: TRANSESOPHAGEAL ECHOCARDIOGRAM (TEE);  Surgeon: [name], MD;  Location: [facility];  Service: Open Heart Surgery;  Laterality: N/A;   WRIST GANGLION EXCISION Right     Current Medications: Current Meds  Medication Sig   Accu-Chek Softclix Lancets lancets 1 each by Other route daily. Use to monitor glucose levels daily; E11.8   acetaminophen (TYLENOL) 500 MG tablet Take 500 mg by mouth as needed.   APPLE CIDER VINEGAR PO Take by mouth daily.    Ascorbic Acid (VITAMIN C PO) Take 1 tablet by mouth daily.   aspirin EC 81 MG tablet Take 1 tablet (81 mg total) by mouth daily.   benazepril (LOTENSIN) 20 MG tablet TAKE 1 TABLET EVERY Civil   Blood Glucose Monitoring Suppl (ACCU-CHEK AVIVA PLUS) w/Device KIT 1 each by Does not apply route daily. Use to monitor glucose levels daily; E11.8   bromocriptine (PARLODEL) 2.5 MG tablet Take 1 tablet (2.5 mg total) by mouth daily.   carvedilol (COREG) 3.125 MG tablet Take 1 tablet (3.125 mg total) by mouth 2 (two) times  daily with a meal.   Cinnamon 500 MG capsule Take 1 capsule by mouth daily.   Coenzyme Q10 (COQ-10) 100 MG CAPS Take 1 tablet by mouth daily.   Cyanocobalamin (VITAMIN B-12 PO) Take 1 tablet by mouth daily.   empagliflozin (JARDIANCE) 10 MG TABS tablet Take 1 tablet (10 mg total) by mouth daily before breakfast.   ezetimibe (ZETIA) 10 MG tablet Take 1 tablet (10 mg total) by mouth daily.   ferrous sulfate 325 (65 FE) MG tablet Take 325 mg by mouth daily with breakfast.   Garlic 7510 MG CAPS Take 1 tablet by mouth daily.   glucose blood (ACCU-CHEK AVIVA PLUS) test strip 1 each by Other route daily. Use to monitor glucose levels daily; E11.8   meloxicam (MOBIC) 15 MG tablet Take 15 mg by mouth daily.   metFORMIN (GLUCOPHAGE-XR) 500 MG 24 hr tablet Take 4 tablets (2,000 mg total) by mouth daily.   Multiple Vitamin (MULTIVITAMIN) tablet Take 1 tablet by mouth daily.   Omega-3 Fatty Acids (FISH OIL PO)  Take 1 capsule by mouth daily.   pantoprazole (PROTONIX) 40 MG tablet Take 1 tablet (40 mg total) by mouth 2 (two) times daily.   ranolazine (RANEXA) 500 MG 12 hr tablet Take 1 tablet (500 mg total) by mouth 2 (two) times daily.   repaglinide (PRANDIN) 2 MG tablet Take 1 tablet (2 mg total) by mouth 2 (two) times daily before a meal.   rosuvastatin (CRESTOR) 40 MG tablet Take 1 tablet (40 mg total) by mouth daily.   sildenafil (VIAGRA) 100 MG tablet TAKE ONE TABLET BY MOUTH DAILY AS NEEDED FOR ERECTILE DYSFUNCTION   tadalafil (CIALIS) 20 MG tablet Take 1 tablet (20 mg total) by mouth as needed for erectile dysfunction.   VITAMIN E PO Take 1 capsule by mouth daily.     Allergies:   Patient has no known allergies.   Social History   Socioeconomic History   Marital status: Married    Spouse name: Not on file   Number of children: 5   Years of education: Not on file   Highest education level: 12th grade  Occupational History   Occupation: Counsellor  Tobacco Use   Smoking status: Former     Packs/Ezra: 0.25    Years: 20.00    Total pack years: 5.00    Types: Cigarettes    Quit date: 10/07/2013    Years since quitting: 8.9   Smokeless tobacco: Never   Tobacco comments:    quit cigarettes, might have a cigar 1 X month  Vaping Use   Vaping Use: Never used  Substance and Sexual Activity   Alcohol use: Yes    Alcohol/week: 3.0 - 4.0 standard drinks of alcohol    Types: 3 - 4 Standard drinks or equivalent per week   Drug use: No   Sexual activity: Not Currently    Birth control/protection: None  Other Topics Concern   Not on file  Social History Narrative   Originally from [place], [place]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)   Married   5 kids   6 grand, 2 great grand   Social Determinants of Health   Financial Resource Strain: Low Risk  (05/19/2022)   Overall Financial Resource Strain (CARDIA)    Difficulty of Paying Living Expenses: Not very hard  Food Insecurity: No Food Insecurity (04/17/2022)   Hunger Vital Sign    Worried About Running Out of Food in the Last Year: Never true    [place] in the Last Year: Never true  Transportation Needs: No Transportation Needs (04/17/2022)   PRAPARE - Hydrologist (Medical): No    Lack of Transportation (Non-Medical): No  Physical Activity: Sufficiently Active (04/17/2022)   Exercise Vital Sign    Days of Exercise per Week: 7 days    Minutes of Exercise per Session: 150+ min  Stress: No Stress Concern Present (04/17/2022)   [place]    Feeling of Stress : Not at all  Social Connections: [place] (04/17/2022)   Social Connection and Isolation Panel [NHANES]    Frequency of Communication with Friends and Family: More than three times a week    Frequency of Social Gatherings with Friends and Family: Three times a week    Attends Religious Services: More than 4 times per year    Active  Member of Clubs or Organizations: Yes    Attends Archivist Meetings: More than  4 times per year    Marital Status: Married     Family History: The patient's family history includes Anuerysm in his brother; Colon polyps in his sister; Diabetes in his maternal grandmother, sister, sister, and son; Heart disease in his son; Heart failure in his father; Hyperlipidemia in his sister and sister; Hypertension in his sister; Kidney disease in his sister; Kidney failure in his sister; Other in his brother. There is no history of Heart attack, Colon cancer, Stomach cancer, or Esophageal cancer.  ROS:   Please see the history of present illness.     All other systems reviewed and are negative.  EKGs/Labs/Other Studies Reviewed:    The following studies were reviewed today: Cardiac catheterization 12/14/2018   Ost LM to Dist LM lesion is 65% stenosed. FFR of this lesion into the distal circumflex was negative, 0.95. Prox LAD lesion is 85% stenosed. LIMA to LAD is patent. 1st Diag lesion is 100% stenosed. SVG to diagonal is occluded. Ost 2nd Mrg to 2nd Mrg lesion is 65% stenosed. Jump graft from OM1 to OM2 is occluded. Ost 1st Mrg lesion is 75% stenosed. SVG to OM1 portion is patent. Mid RCA lesion is 100% stenosed. SVG to PDA is patent. Ostial PDA disease is unchanged from prior. Ost RPDA to RPDA lesion is 95% stenosed. The left ventricular ejection fraction is 35-45% by visual estimate. There is mild to moderate left ventricular systolic dysfunction. LV end diastolic pressure is normal.   Continue medical therapy for small vessel disease and for LV dysfunction.   Diagnostic Dominance: Right    Echocardiogram 12/13/2018     1. The left ventricle has mildly reduced systolic function, with an  ejection fraction of 45-50%. The cavity size was normal. Left ventricular  diastolic Doppler parameters are indeterminate. There is abnormal septal  motion consistent with post-operative   status. Left ventricular diffuse hypokinesis.   2. The right ventricle has normal systolic function. The cavity was  normal. There is no increase in right ventricular wall thickness.   3. Left atrial size was mildly dilated.   4. There is mild mitral annular calcification present.   5. The aortic valve is tricuspid Mild thickening of the aortic valve Mild  calcification of the aortic valve.   Diastology        LV e' lateral:   10.30 cm/s        LV E/e' lateral: 10.0        LV e' medial:    7.89 cm/s        LV E/e' medial:  13.1   MV Decel Time: 204 msec  MV E velocity: 103.00 cm/s SHUNTS  MV A velocity: 54.10 cm/s  Systemic Diam: 1.70 cm  MV E/A ratio:  1.90   EKG:  EKG is ordered today.  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  QTc 456 ms.  Recent Labs: 09/06/2021: TSH 0.78 07/11/2022: ALT 24; BUN 17; Creatinine 1.17; Hemoglobin 12.2; Platelet Count 126; Potassium 4.6; Sodium 135  Recent Lipid Panel    Component Value Date/Time   CHOL 131 09/06/2021 0915   CHOL 142 10/26/2019 0903   TRIG 114.0 09/06/2021 0915   HDL 54.70 09/06/2021 0915   HDL 74 10/26/2019 0903   CHOLHDL 2 09/06/2021 0915   VLDL 22.8 09/06/2021  0915   LDLCALC 53 09/06/2021 0915   LDLCALC 56 08/07/2020 0906   LDLDIRECT 204.8 09/05/2013 1559     Risk Assessment/Calculations:                Physical Exam:    VS:  BP 128/78 (BP Location: Left Arm, Patient Position: Sitting, Cuff Size: Large)   Pulse 80   Ht _0  (1.702 m)   Wt 90.5 kg   SpO2 95%   BMI 31.26 kg/m     Wt Readings from Last 3 Encounters:  09/05/22 90.5 kg  04/18/22 85.3 kg  04/11/22 88.8 kg     GEN: Mildly obese, well nourished, well developed in no acute distress HEENT: Normal NECK: No  JVD; No carotid bruits LYMPHATICS: No lymphadenopathy CARDIAC: RRR, no murmurs, rubs, gallops RESPIRATORY:  Clear to auscultation without rales, wheezing or rhonchi  ABDOMEN: Soft, non-tender, non-distended MUSCULOSKELETAL: 1+ soft pitting right pretibial edema; No deformity  SKIN: Warm and dry NEUROLOGIC:  Alert and oriented x 3 PSYCHIATRIC:  Normal affect   ASSESSMENT:    1. Coronary artery disease of bypass graft of native heart with stable angina pectoris ([facility])   2. Chronic combined systolic (congestive) and diastolic (congestive) heart failure ([place])   3. Hypercholesterolemia   4. Type 2 diabetes mellitus with complication, without long-term current use of insulin ([place])   5. Essential hypertension   6. Erectile dysfunction due to diseases classified elsewhere   7. CKD (chronic kidney disease) stage 2, GFR 60-89 ml/min    PLAN:    In order of problems listed above:  CAD: Has a few territories downstream of occluded vessels that are not amenable to revascularization which can cause angina pectoris.  On current antianginal regimen (beta-blockers, ranolazine) his angina is very well-controlled.  Avoiding long-acting nitrates due to use of PDE 5 inhibitors.  He is on aspirin, statin.. CHF: Although he has good functional status there is convincing evidence that he has both systolic and diastolic heart failure.  LVEF has been consistently estimated to be abnormal, although only mildly depressed by echo and moderately depressed by LV angiography.  His echocardiogram from 2020 showed clear evidence of elevated mean left atrial pressure with pseudonormal mitral inflow pattern and E/e'>10.  Although he does not need loop diuretics, I think he would benefit from treatment with an SGLT2 inhibitor for diastolic heart failure.  He is on an ACE inhibitor for systolic dysfunction as well as a beta-blocker.  At some point might benefit from transition to [facility], but starting to expensive medicines at  the same time may not be affordable for him.  Will restart Jardiance 10 mg once daily since this will also help with blood sugar control. HLP: Excellent lipid profile on current rosuvastatin plus ezetimibe prescription.  LDL well within target less than 70.  He will have repeat labs with Dr. Sarajane Jews in a few weeks. DM: Not well-controlled, possibly due to cortisone joint injections.  Would benefit from an SGLT2 inhibitor for this as well.  He will have a repeat hemoglobin A1c with Dr. Sarajane Jews in February.  By that time he will have been on Jardiance for couple of months.  Vania Rea may also help him with some weight loss, which has been a frustrating cause for him.  We reviewed the potential side effects of increased risk of urinary tract infections, groin yeast infections and the rare but very dangerous possibility of Fournier's gangrene.  His new endocrinologist will be Dr. Kelton Pillar, but he has  not yet established with her (appt in February).  They may be able to elucidate why Dr. Loanne Drilling has been prescribing bromocriptine for his DM. HTN: Excellent control.  Target BP less than 130/80 for best vascular and renal protection. CKD2: Most recent creatinine was borderline high at 1.17, but this was much better than the average reading over the last couple of years with a range of 1.25-1.54.  Important to stay very well-hydrated once he starts taking the SGLT2 inhibitor ED: He knows that he should never combine nitrates with PDE 5 inhibitors.  He asked about the efficacy or safety of over-the-counter products.  Told him to avoid anything that has yohimbine.  Will stick with the prescription sildenafil or tadalafil. 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  He has had bilateral carpal tunnel syndrome, but no other symptoms to suggest amyloidosis.  Mild abnormalities on CBC (WBC 3.8K, hemoglobin 12.2, platelets 126K), but also had evidence of iron deficiency with low ferritin at 10, surprisingly  saturation ratio high at 80%.  No evidence of low voltage on ECG.  Very mild LVH on echo.  Mitral annulus velocities are only mildly decreased.           Medication Adjustments/Labs and Tests Ordered: Current medicines are reviewed at length with the patient today.  Concerns regarding medicines are outlined above.  Orders Placed This Encounter  Procedures   EKG 12-Lead   Meds ordered this encounter  Medications   empagliflozin (JARDIANCE) 10 MG TABS tablet    Sig: Take 1 tablet (10 mg total) by mouth daily before breakfast.    Dispense:  30 tablet    Refill:  3    Patient Instructions  Medication Instructions:  Start Jardiance 10 mg ( Take 1 Tablet Daily). *If you need a refill on your cardiac medications before your next appointment, please call your pharmacy*   Lab Work: No Labs If you have labs (blood work) drawn today and your tests are completely normal, you will receive your results only by: [place] (if you have MyChart) OR A paper copy in the mail If you have any lab test that is abnormal or we need to change your treatment, we will call you to review the results.   Testing/Procedures: No Testing   Follow-Up: At [facility], you and your health needs are our priority.  As part of our continuing mission to provide you with exceptional heart care, we have created designated Provider Care Teams.  These Care Teams include your primary Cardiologist (physician) and Advanced Practice Providers (APPs -  Physician Assistants and Nurse Practitioners) who all work together to provide you with the care you need, when you need it.  We recommend signing up for the patient portal called "MyChart".  Sign up information is provided on this After Visit Summary.  MyChart is used to connect with patients for Virtual Visits (Telemedicine).  Patients are able to view lab/test results, encounter notes, upcoming appointments, etc.  Non-urgent messages can be sent to your  provider as well.   To learn more about what you can do with MyChart, go to [email or web address].    Your next appointment:   1 year(s)  The format for your next appointment:   In Person  Provider:   Sanda Klein, MD      Signed, Sanda Klein, MD  09/05/2022 11:11 AM    [place]

## 2022-09-09 ENCOUNTER — Other Ambulatory Visit: Payer: Self-pay

## 2022-09-09 DIAGNOSIS — E118 Type 2 diabetes mellitus with unspecified complications: Secondary | ICD-10-CM

## 2022-09-09 MED ORDER — ACCU-CHEK SOFTCLIX LANCETS MISC: 1.0000 | Freq: Every day | 2 refills | Status: DC

## 2022-09-10 ENCOUNTER — Telehealth: Payer: Self-pay | Admitting: Pharmacist

## 2022-09-10 NOTE — Chronic Care Management (AMB) (Unsigned)
    Chronic Care Management Pharmacy Assistant   Name: Kenn Rekowski Pagliarulo  MRN: [id number] DOB: 12/24/1950  09/12/2022 APPOINTMENT REMINDER  Called [facility], No answer, left message of appointment on 09/12/2022 at 8:30 via telephone visit with Jeni Salles, Pharm D. Notified to have all medications, supplements, blood pressure and/or blood sugar logs available during appointment and to return call if need to reschedule.  OR: Jasmin E Unrein was reminded to have all medications, supplements and any blood glucose and blood pressure readings available for review with Jeni Salles, Pharm. D, at his telephone visit on 09/12/2022 at 8:30.  Care Gaps: AWV - scheduled 12/03/2022 Last BP - 128/78 on 09/05/2022 Last A1C - 9.2 on 04/25/2022 Eye exam - overdue Pneumonia - overdue Flu - due Covid - overdue Foot exam - overdue  [facility] Rating Drug: Benazepril 20 mg - last filled 07/30/2022 90 DS at [facility] 10 mg  - last filled 09/06/2022 90 DS at [facility] Metformin 500 mg - last filled 01/16/2022 90 at [facility]   Repaglinide 2 mg - last filled 07/27/2022 90 DS at [facility] Rosuvastatin 40 mg - last filled 07/31/2022 90 DS at [facility]   Any gaps in medications fill history?  [place] Pharmacist Assistant [phone]

## 2022-09-11 NOTE — Progress Notes (Signed)
Chronic Care Management Pharmacy Note  09/12/2022 Name:  Jerome Vaughn MRN:  [id number] DOB:  12-27-1950  Summary: A1c not at goal < 7% LDL at goal < 55 BP not quite at goal < 130/80  Recommendations/Changes made from today's visit: -Recommended fructosamine level given recent low hemoglobin -Recommended discussing discontinuing bromocriptine with endo -Recommended routine BP monitoring and bringing cuff to next office visit to ensure accuracy  Plan: Scheduled CPE in 1 month DM assessment in 2 months CCM follow up in 4 months   Subjective: Jerome Vaughn is an 71 y.o. year old male who is a primary patient of [name], MD.  The CCM team was consulted for assistance with disease management and care coordination needs.    Engaged with patient by telephone for follow up visit in response to provider referral for pharmacy case management and/or care coordination services.   Consent to Services:  The patient was given information about Chronic Care Management services, agreed to services, and gave verbal consent prior to initiation of services.  Please see initial visit note for detailed documentation.   Patient Care Team: [name], MD as PCP - General (Family Medicine) [name], MD as PCP - Cardiology (Cardiology) Thornton Park, MD as Consulting Physician (Gastroenterology) [name], MD (Inactive) as Consulting Physician (Endocrinology) Orson Slick, MD as Consulting Physician (Hematology and Oncology) Viona Gilmore, [facility] as Pharmacist (Pharmacist)  Recent office visits: 04/18/22 Alysia Penna MD - Patient was seen for chronic conditions follow up.  11/29/2021 Rolene Arbour LPN - Medicare annual wellness exam.   Recent consult visits: 09/05/22 Sanda Klein, MD (cardiology): Patient presented for CAD follow up. Prescribed Jardiance 10 mg daily. Follow up in 1 year.  08/15/22 [name], DPM (podiatry): Patient presented for toe nail  trim.  06/06/2022 [name] ([facility]) - Patient was seen for Osteoarthritis of right knee joint. No additional chart notes.    04/18/22 Elizabeth Sauer, PA-C ([facility]): Patient presented for arthritis of knee follow up.  04/11/22 Narda Rutherford, MD (hematology/oncology): Patient presented for iron deficiency anemia.   01/24/22 [name], MD (endo): Patient presented for DM follow up. Increased repaglinide to 2 mg BID.  Hospital visits: None in previous 6 months   Objective:  Lab Results  Component Value Date   CREATININE 1.17 07/11/2022   BUN 17 07/11/2022   GFR 53.15 (L) 09/06/2021   GFRNONAA >60 07/11/2022   GFRAA >60 06/14/2020   NA 135 07/11/2022   K 4.6 07/11/2022   CALCIUM 8.9 07/11/2022   CO2 25 07/11/2022   GLUCOSE 202 (H) 07/11/2022    Lab Results  Component Value Date/Time   HGBA1C 9.2 (H) 04/25/2022 11:19 AM   HGBA1C 7.3 (A) 01/24/2022 08:38 AM   HGBA1C 8.4 (A) 10/25/2021 08:28 AM   HGBA1C 8.0 (H) 09/06/2021 09:15 AM   GFR 53.15 (L) 09/06/2021 09:15 AM   GFR 81.97 04/11/2019 09:24 AM   MICROALBUR 3.5 (H) 04/25/2022 11:19 AM   MICROALBUR 1.2 08/22/2014 10:34 AM    Last diabetic Eye exam:  Lab Results  Component Value Date/Time   HMDIABEYEEXA No Retinopathy 09/18/2020 12:00 AM    Last diabetic Foot exam: No results found for: "HMDIABFOOTEX"   Lab Results  Component Value Date   CHOL 131 09/06/2021   HDL 54.70 09/06/2021   LDLCALC 53 09/06/2021   LDLDIRECT 204.8 09/05/2013   TRIG 114.0 09/06/2021   CHOLHDL 2 09/06/2021       Latest  Ref Rng & Units 07/11/2022   10:21 AM 04/04/2022    2:07 PM 10/04/2021    2:07 PM  Hepatic Function  Total Protein 6.5 - 8.1 g/dL 8.0  7.9  8.4   Albumin 3.5 - 5.0 g/dL 4.2  4.2  4.4   AST 15 - 41 U/L 27  29  33   ALT 0 - 44 U/L 24  46  41   Alk Phosphatase 38 - 126 U/L 33  29  36   Total Bilirubin 0.3 - 1.2 mg/dL 0.4  0.5  0.5     Lab Results  Component Value Date/Time   TSH 0.78 09/06/2021 09:15 AM    TSH 0.55 08/07/2020 09:06 AM       Latest Ref Rng & Units 07/11/2022   10:21 AM 04/04/2022    2:07 PM 10/04/2021    2:07 PM  CBC  WBC 4.0 - 10.5 K/uL 3.8  4.0  5.0   Hemoglobin 13.0 - 17.0 g/dL 12.2  12.0  14.0   Hematocrit 39.0 - 52.0 % 36.2  35.0  40.5   Platelets 150 - 400 K/uL 126  119  158     No results found for: "VD25OH"  Clinical ASCVD: Yes  The 10-year ASCVD risk score (Arnett DK, et al., 2019) is: 28.2%   Values used to calculate the score:     Age: 71 years     Sex: Male     Is Non-Hispanic African American: Yes     Diabetic: Yes     Tobacco smoker: No     Systolic Blood Pressure: 967 mmHg     Is BP treated: Yes     HDL Cholesterol: 54.7 mg/dL     Total Cholesterol: 131 mg/dL       04/18/2022   11:22 AM 11/29/2021    9:55 AM 08/30/2021    2:52 PM  Depression screen PHQ 2/9  Decreased Interest 0 0 0  Down, Depressed, Hopeless 0 0 0  PHQ - 2 Score 0 0 0  Altered sleeping 0  0  Tired, decreased energy 0  0  Change in appetite 0  0  Feeling bad or failure about yourself  0  0  Trouble concentrating 0  0  Moving slowly or fidgety/restless 0  0  Suicidal thoughts 0  0  PHQ-9 Score 0  0  Difficult doing work/chores Not difficult at all  Not difficult at all     Social History   Tobacco Use  Smoking Status Former   Packs/Frohlich: 0.25   Years: 20.00   Total pack years: 5.00   Types: Cigarettes   Quit date: 10/07/2013   Years since quitting: 8.9  Smokeless Tobacco Never  Tobacco Comments   quit cigarettes, might have a cigar 1 X month   BP Readings from Last 3 Encounters:  09/05/22 128/78  04/18/22 132/80  04/11/22 136/80   Pulse Readings from Last 3 Encounters:  09/05/22 80  04/18/22 74  04/11/22 99   Wt Readings from Last 3 Encounters:  09/05/22 199 lb 9.6 oz (90.5 kg)  04/18/22 188 lb (85.3 kg)  04/11/22 195 lb 12.8 oz (88.8 kg)   BMI Readings from Last 3 Encounters:  09/05/22 31.26 kg/m  04/18/22 29.44 kg/m  04/11/22 30.67 kg/m     Assessment/Interventions: Review of patient past medical history, allergies, medications, health status, including review of consultants reports, laboratory and other test data, was performed as part of comprehensive evaluation and provision  of chronic care management services.   SDOH:  (Social Determinants of Health) assessments and interventions performed: Yes (last 04/29/22) SDOH Interventions    Flowsheet Row Chronic Care Management from 05/09/2022 in [facility] from 11/29/2021 in [facility] [place] from 11/09/2020 in [facility] [place] Management from 04/05/2020 in [facility] [place] Interventions -- Intervention Not Indicated Intervention Not Indicated --  Housing Interventions -- Intervention Not Indicated Intervention Not Indicated --  Transportation Interventions -- Intervention Not Indicated Intervention Not Indicated Intervention Not Indicated  Financial Strain Interventions Other (Comment)  [working on sending medications to cheaper pharmacy] Intervention Not Indicated Intervention Not Indicated Intervention Not Indicated  Physical Activity Interventions -- Intervention Not Indicated Intervention Not Indicated --  Stress Interventions -- Intervention Not Indicated Intervention Not Indicated --  Social Connections Interventions -- Intervention Not Indicated Intervention Not Indicated --      SDOH Screenings   Food Insecurity: No Food Insecurity (04/17/2022)  Housing: Low Risk  (04/17/2022)  Transportation Needs: No Transportation Needs (04/17/2022)  Alcohol Screen: Low Risk  (04/17/2022)  Depression (PHQ2-9): Low Risk  (04/18/2022)  Financial Resource Strain: Low Risk  (05/19/2022)  Physical Activity: Sufficiently Active (04/17/2022)  Social Connections: Socially Integrated (04/17/2022)  Stress: No Stress Concern Present (04/17/2022)   Tobacco Use: Medium Risk (09/05/2022)    [place]  No Known Allergies  Medications Reviewed Today     Reviewed by Viona Gilmore, [facility] (Pharmacist) on 09/12/22 at 0859  Med List Status: <None>   Medication Order Taking? Sig Documenting Provider Last Dose Status Informant  Accu-Chek Softclix Lancets lancets [id number]  1 each by Other route daily. Use to monitor glucose levels daily; E11.8 Shamleffer, Melanie Crazier, MD  Active   acetaminophen (TYLENOL) 500 MG tablet [id number]  Take 500 mg by mouth as needed. [provider]  Active   APPLE CIDER VINEGAR PO [id number]  Take by mouth daily.  [provider]  Active   Ascorbic Acid (VITAMIN C PO) [id number]  Take 1 tablet by mouth daily. [provider]  Active Self  aspirin EC 81 MG tablet [id number]  Take 1 tablet (81 mg total) by mouth daily. Burtis Junes, NP  Active Self           Med Note Rosana Hoes, SOPHIA A   Tue Jan 24, 2020 11:44 AM)    benazepril (LOTENSIN) 20 MG tablet [id number] Yes TAKE 1 TABLET EVERY Rettke [name], MD Taking Active   Blood Glucose Monitoring Suppl (ACCU-CHEK AVIVA PLUS) w/Device KIT [id number]  1 each by Does not apply route daily. Use to monitor glucose levels daily; E11.8 Shamleffer, Melanie Crazier, MD  Active   bromocriptine (PARLODEL) 2.5 MG tablet [id number] Yes Take 1 tablet (2.5 mg total) by mouth daily. Shamleffer, Melanie Crazier, MD Taking Active   carvedilol (COREG) 3.125 MG tablet [id number] Yes Take 1 tablet (3.125 mg total) by mouth 2 (two) times daily with a meal. [name], MD Taking Active   Cinnamon 500 MG capsule [id number]  Take 1 capsule by mouth daily. [provider]  Active   Coenzyme Q10 (COQ-10) 100 MG CAPS [id number]  Take 1 tablet by mouth daily. [provider]  Active   Cyanocobalamin (VITAMIN B-12 PO) [id number]  Take 1 tablet by mouth daily. [provider]  Active Self  empagliflozin (JARDIANCE) 10 MG TABS tablet  [id number] Yes  Take 1 tablet (10 mg total) by mouth daily before breakfast. Croitoru, Mihai, MD Taking Active   ezetimibe (ZETIA) 10 MG tablet [id number] Yes Take 1 tablet (10 mg total) by mouth daily. [name], MD Taking Active   ferrous sulfate 325 (65 FE) MG tablet [id number]  Take 325 mg by mouth daily with breakfast. [provider]  Active Self  Garlic 7829 MG CAPS [id number]  Take 1 tablet by mouth daily. [provider]  Active   glucose blood (ACCU-CHEK AVIVA PLUS) test strip [id number]  1 each by Other route daily. Use to monitor glucose levels daily; E11.8 [name], MD  Active   meloxicam [facility]) 15 MG tablet [id number] Yes Take 15 mg by mouth daily. [provider] Taking Active   metFORMIN (GLUCOPHAGE-XR) 500 MG 24 hr tablet [id number] Yes Take 4 tablets (2,000 mg total) by mouth daily. [name], MD Taking Active   Multiple Vitamin (MULTIVITAMIN) tablet [id number]  Take 1 tablet by mouth daily. [provider]  Active Self  nitroGLYCERIN (NITROSTAT) 0.4 MG SL tablet [id number]  Place 1 tablet (0.4 mg total) under the tongue every 5 (five) minutes as needed for chest pain.  Patient not taking: Reported on 09/05/2022   [name], MD  Active            Med Note Louretta Shorten, APRIL   Thu Jan 26, 2020  8:14 AM)    Omega-3 Fatty Acids (FISH OIL PO) [id number]  Take 1 capsule by mouth daily. [provider]  Active   pantoprazole (PROTONIX) 40 MG tablet [id number] Yes Take 1 tablet (40 mg total) by mouth 2 (two) times daily. Thornton Park, MD Taking Active   ranolazine (RANEXA) 500 MG 12 hr tablet [id number] Yes Take 1 tablet (500 mg total) by mouth 2 (two) times daily. [name], MD Taking Active   repaglinide [facility]) 2 MG tablet [id number] Yes Take 1 tablet (2 mg total) by mouth 2 (two) times daily before a meal. [name], MD Taking Active   rosuvastatin (CRESTOR) 40 MG tablet [id number] Yes Take 1 tablet (40 mg total) by mouth  daily. [name], MD Taking Active   sildenafil (VIAGRA) 100 MG tablet [id number] Yes TAKE ONE TABLET BY MOUTH DAILY AS NEEDED FOR ERECTILE DYSFUNCTION [name], MD Taking Active   tadalafil (CIALIS) 20 MG tablet [id number] Yes Take 1 tablet (20 mg total) by mouth as needed for erectile dysfunction. [name], MD Taking Active   VITAMIN E PO [id number]  Take 1 capsule by mouth daily. [provider]  Active             Patient Active Problem List   Diagnosis Date Noted   OA (osteoarthritis) of knee 04/18/2022   Left shoulder pain 04/18/2022   GERD (gastroesophageal reflux disease)    Bacterial infection due to H. 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   Abnormal stress test 11/21/2015   Coronary artery disease of bypass  graft of native heart with stable angina pectoris (HCC)    Nonspecific abnormal electrocardiogram (ECG) (EKG) 11/07/2015   Essential hypertension 03/30/2011   Hypercholesterolemia 03/30/2011   Tobacco use disorder 03/30/2011   Colon polyp 03/30/2011    Immunization History  Administered Date(s) Administered   Fluad Quad(high Dose 65+) 08/02/2020, 08/30/2021, 08/07/2022   Influenza, High Dose Seasonal PF 05/30/2019   Influenza,inj,Quad PF,6+ Mos 08/22/2014, 12/03/2016, 11/23/2017   Influenza-Unspecified 08/29/2013, 07/30/2015, 08/02/2016, 10/04/2018, 10/07/2018, 05/12/2019   PFIZER(Purple  Top)SARS-COV-2 Vaccination 11/11/2019, 12/05/2019, 06/28/2020, 02/05/2021, 01/31/2022   Pfizer Covid-19 Vaccine Bivalent Booster 19yr & up 06/21/2021   Pneumococcal Conjugate-13 08/27/2015   Pneumococcal Polysaccharide-23 02/09/2017   Respiratory Syncytial Virus Vaccine,Recomb Aduvanted(Arexvy) 08/17/2022   Td (Adult),unspecified 11/03/2021   Tdap 03/28/2011, 11/03/2021   Zoster Recombinat (Shingrix) 11/26/2018, 05/31/2019   Patient reports his shoulder is still hurting as he had torn a rotator cuff a while back. He has been doing some exercises for this and had a cortisone shot months ago. His right knee was also acting up and he got a gel shot and this seemed to have helped.  Patient was started on Jardiance by cardiology and he was on it in the past as well but it was stopped due to poor insurance coverage. He started it on Sunday of this week and has already seen some lowering of his blood sugars.  Conditions to be addressed/monitored:  Hypertension, Hyperlipidemia, Diabetes, Heart Failure, Coronary Artery Disease, GERD, and Tobacco use  Conditions addressed this visit: Hypertension, Diabetes, CAD  Care Plan : CCM Pharmacy Care Plan  Updates made by PViona Gilmore RPerryvillesince 09/12/2022 12:00 AM     Problem: Problem: Hypertension, Hyperlipidemia, Diabetes, Heart Failure, Coronary Artery Disease, GERD, and Tobacco use      Long-Range Goal: Patient-Specific Goal   Start Date: 11/08/2021  Expected End Date: 11/08/2022  Recent Progress: On track  Priority: High  Note:   Current Barriers:  Unable to independently afford treatment regimen Unable to maintain control of diabetes Suboptimal therapeutic regimen for diabetes  Pharmacist Clinical Goal(s):  Patient will verbalize ability to afford treatment regimen achieve adherence to monitoring guidelines and medication adherence to achieve therapeutic efficacy maintain control of diabetes as evidenced by A1c  through collaboration  with PharmD and provider.   Interventions: 1:1 collaboration with FLaurey Morale MD regarding development and update of comprehensive plan of care as evidenced by provider attestation and co-signature Inter-disciplinary care team collaboration (see longitudinal plan of care) Comprehensive medication review performed; medication list updated in electronic medical record  Hypertension (BP goal <130/80) -Not ideally controlled -Current treatment: Benazepril 20 mg 1 tablet daily - Appropriate, Query effective, Safe, Accessible Carvedilol 3.125 mg 1 tablet twice daily - Appropriate, Query effective, Safe, Accessible -Medications previously tried: n/a  -Current home readings: has a wrist cuff - does not check often -Current dietary habits: limits salt intake -Current exercise habits: active with work and walking after work -Denies hypotensive/hypertensive symptoms -Educated on BP goals and benefits of medications for prevention of heart attack, stroke and kidney damage; Importance of home blood pressure monitoring; Proper BP monitoring technique; -Counseled to monitor BP at home weekly, document, and provide log at future appointments -Counseled on diet and exercise extensively Recommended to continue current medication  Hyperlipidemia: (LDL goal < 55) -Controlled -Current treatment: Ezetimibe 10 mg 1 tablet daily - Appropriate, Effective, Safe, Accessible Rosuvastatin 40 mg 1 tablet daily  - Appropriate, Effective, Safe, Accessible -Medications previously tried: none  -Current dietary  patterns: did not discuss -Current exercise habits: active at work and walking after work -Educated on [employer];  Benefits of statin for ASCVD risk reduction; Importance of limiting foods high in cholesterol; -Counseled on diet and exercise extensively Recommended to continue current medication  Diabetes (A1c goal <7%) -Uncontrolled -Current medications: Bromocriptine 2.5 mg 1 tablet daily  - Query Appropriate, Query effective, Safe, Query accessible Repaglinide 1 mg 2 tablets twice daily before a meal - Query Appropriate, Query effective, Safe, Accessible Metformin XR 500 mg 4 tablets daily (not always taking 4 per [place]) - Appropriate, Query effective, Safe, Accessible Jardiance 10 mg 1 tablet daily - Appropriate, Query effective, Safe, Accessible -Medications previously tried: Geneticist, molecular (cost), pioglitazone (edema), semaglutide (insurance formulary), Januvia (unknown), Farxiga (insurance changed to [place]) -Current home glucose readings fasting glucose: 140s, 150, 144 - has come down some since starting the Jardiance post prandial glucose: 147, 160 -Denies hypoglycemic/hyperglycemic symptoms -Current meal patterns:  breakfast: light breakfast (5am) - doesn't skip because of repaglinide; boiled egg and toast and juice; morning star instead of bacon and sausage lunch: noon; more fruits and vegetables with diet dinner: 6 pm; has cut back on bread and eating chicken and fish; salads more snacks: has cut back on heavy foods drinks: no soda; juice  & water, coffee (splenda) & tea (splenda zero) -Current exercise: walking after work and 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  CAD (Goal: prevent heart events) -Controlled -Current treatment  Aspirin 81 mg 1 tablet daily - Appropriate, Effective, Safe, Accessible Ranolazine 500 mg 1 tablet twice daily - Appropriate, Effective, Safe, Query accessible -Medications previously tried: n/a  -Collaborated with cardiology  to send ranolazine to [facility] for lower price.  GERD (Goal: minimize symptoms) -Controlled -Current treatment  Pantoprazole 40 mg 1 tablet daily - Appropriate, Effective, Safe, Accessible -Medications previously tried: none  - Reassess the need at follow up.  Health Maintenance -Vaccine gaps: Prevnar20 -Current therapy:  Multivitamin daily Garlic 0539 mg 1 capsule daily Vitamin C daily Ciclopirox apply as directed Cinnamon 500 mg 1 capsule daily CoQ10 100 mg 1 capsule daily Vitamin B12 daily Ferrous sulfate 325 mg 1 tablet daily Garlic 7673 mg 1 capsule daily Tadalafil 20 mg 1 tablet daily Vitamin E daily -Educated on Herbal supplement research is limited and benefits usually cannot be proven Cost vs benefit of each product must be carefully weighed by individual consumer Supplements may interfere with prescription drugs -Patient is satisfied with current therapy and denies issues - Reassess supplements at follow up.  Patient Goals/Self-Care Activities Patient will:  - check glucose twice daily, document, and provide at future appointments check blood pressure weekly, document, and provide at future appointments target a minimum of 150 minutes of moderate intensity exercise weekly  Follow Up Plan: Telephone follow up appointment with care management team member scheduled for: 5 months      Medication Assistance:  Does not qualify for patient assistance at this time.  Compliance/Adherence/Medication fill history: Care Gaps: Prevnar20, eye exam, foot exam Last BP - 128/78 on 09/05/2022 Last A1C - 9.2 on 04/25/2022  Star-Rating Drugs: Benazepril 20 mg -  last filled 07/30/2022 90 DS at [facility] 10 mg  - last filled 09/06/2022 90 DS at [facility] Metformin 500 mg - last filled 01/16/2022 90 at [facility] verified with pharm tech Repaglinide 2 mg - last filled 07/27/2022 90 DS at [facility] Rosuvastatin 40 mg - last filled 07/31/2022 90 DS at [facility]    Patient's preferred pharmacy is:  [facility] [id number] - [address] [place] [address] Phone: [phone] Fax: [phone]  [place], [place] [address] Phone: [phone] Fax: [phone]  (Inactive see NCPDP [id number]) [place], [place] [address] [place] [address] Phone: [phone] Fax: [place], [place] [address] [place] [address] Phone: [phone] Fax: [phone]  Uses pill box? Yes Pt endorses 100% compliance  We discussed: Current pharmacy is preferred with insurance plan and patient is satisfied with pharmacy services Patient decided to: Continue current medication management strategy  Care Plan and Follow Up Patient Decision:  Patient agrees to Care Plan and Follow-up.  Plan: Telephone follow up appointment with care management team member scheduled for:  4-5 months  Jeni Salles, PharmD, [facility] [phone]

## 2022-09-12 ENCOUNTER — Ambulatory Visit (INDEPENDENT_AMBULATORY_CARE_PROVIDER_SITE_OTHER): Payer: Medicare HMO | Admitting: Pharmacist

## 2022-09-12 DIAGNOSIS — E118 Type 2 diabetes mellitus with unspecified complications: Secondary | ICD-10-CM

## 2022-09-12 DIAGNOSIS — I10 Essential (primary) hypertension: Secondary | ICD-10-CM

## 2022-09-12 NOTE — Patient Instructions (Addendum)
Hi Corrin,  It was great to get speak with you again!  Please reach out to me if you have any questions or need anything before our follow up!  Best, Maddie  Jeni Salles, PharmD, [facility] [place]  Visit Information   Goals Addressed   None    Patient Care Plan: [facility]CM Pharmacy Care Plan     Problem Identified: Problem: Hypertension, Hyperlipidemia, Diabetes, Heart Failure, Coronary Artery Disease, GERD, and Tobacco use      Long-Range Goal: Patient-Specific Goal   Start Date: 11/08/2021  Expected End Date: 11/08/2022  Recent Progress: On track  Priority: High  Note:   Current Barriers:  Unable to independently afford treatment regimen Unable to maintain control of diabetes Suboptimal therapeutic regimen for diabetes  Pharmacist Clinical Goal(s):  Patient will verbalize ability to afford treatment regimen achieve adherence to monitoring guidelines and medication adherence to achieve therapeutic efficacy maintain control of diabetes as evidenced by A1c  through collaboration with PharmD and provider.   Interventions: 1:1 collaboration with [name], MD regarding development and update of comprehensive plan of care as evidenced by provider attestation and co-signature Inter-disciplinary care team collaboration (see longitudinal plan of care) Comprehensive medication review performed; medication list updated in electronic medical record  Hypertension (BP goal <130/80) -Not ideally controlled -Current treatment: Benazepril 20 mg 1 tablet daily - Appropriate, Query effective, Safe, Accessible Carvedilol 3.125 mg 1 tablet twice daily - Appropriate, Query effective, Safe, Accessible -Medications previously tried: n/a  -Current home readings: has a wrist cuff - does not check often -Current dietary habits: limits salt intake -Current exercise habits: active with work and walking after work -Denies  hypotensive/hypertensive symptoms -Educated on BP goals and benefits of medications for prevention of heart attack, stroke and kidney damage; Importance of home blood pressure monitoring; Proper BP monitoring technique; -Counseled to monitor BP at home weekly, document, and provide log at future appointments -Counseled on diet and exercise extensively Recommended to continue current medication  Hyperlipidemia: (LDL goal < 55) -Controlled -Current treatment: Ezetimibe 10 mg 1 tablet daily - Appropriate, Effective, Safe, Accessible Rosuvastatin 40 mg 1 tablet daily  - Appropriate, Effective, Safe, Accessible -Medications previously tried: none  -Current dietary patterns: did not discuss -Current exercise habits: active at work and walking after work -Educated on [employer];  Benefits of statin for ASCVD risk reduction; Importance of limiting foods high in cholesterol; -Counseled on diet and exercise extensively Recommended to continue current medication  Diabetes (A1c goal <7%) -Uncontrolled -Current medications: Bromocriptine 2.5 mg 1 tablet daily - Query Appropriate, Query effective, Safe, Query accessible Repaglinide 1 mg 2 tablets twice daily before a meal - Query Appropriate, Query effective, Safe, Accessible Metformin XR 500 mg 4 tablets daily (not always taking 4 per [place]) - Appropriate, Query effective, Safe, Accessible Jardiance 10 mg 1 tablet daily - Appropriate, Query effective, Safe, Accessible -Medications previously tried: Geneticist, molecular (cost), pioglitazone (edema), semaglutide (insurance formulary), Januvia (unknown), Farxiga (insurance changed to [place]) -Current home glucose readings fasting glucose: 140s, 150, 144 - has come down some since starting the Jardiance post prandial glucose: 147, 160 -Denies hypoglycemic/hyperglycemic symptoms -Current meal patterns:  breakfast: light breakfast (5am) - doesn't skip because of repaglinide; boiled egg and toast and  juice; morning star instead of bacon and sausage lunch: noon; more fruits and vegetables with diet dinner: 6 pm; has cut back on bread and eating chicken and fish; salads more snacks: has cut back on heavy foods drinks:  no soda; juice  & water, coffee (splenda) & tea (splenda zero) -Current exercise: walking after work and 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  CAD (Goal: prevent heart events) -Controlled -Current treatment  Aspirin 81 mg 1 tablet daily - Appropriate, Effective, Safe, Accessible Ranolazine 500 mg 1 tablet twice daily - Appropriate, Effective, Safe, Query accessible -Medications previously tried: n/a  -Collaborated with cardiology to send ranolazine to [employer] for lower price.  GERD (Goal: minimize symptoms) -Controlled -Current treatment  Pantoprazole 40 mg 1 tablet daily - Appropriate, Effective, Safe, Accessible -Medications previously tried: none  - Reassess the need at follow up.  Health Maintenance -Vaccine gaps: Prevnar20 -Current therapy:  Multivitamin daily Garlic 0254 mg 1 capsule daily Vitamin C daily Ciclopirox apply as directed Cinnamon 500 mg 1 capsule daily CoQ10 100 mg 1 capsule daily Vitamin B12 daily Ferrous sulfate 325 mg 1 tablet daily Garlic 2706 mg 1 capsule daily Tadalafil 20 mg 1 tablet daily Vitamin E daily -Educated on Herbal supplement research is limited and benefits usually cannot be proven Cost vs benefit of each product must be carefully weighed by individual consumer Supplements may interfere with  prescription drugs -Patient is satisfied with current therapy and denies issues - Reassess supplements at follow up.  Patient Goals/Self-Care Activities Patient will:  - check glucose twice daily, document, and provide at future appointments check blood pressure weekly, document, and provide at future appointments target a minimum of 150 minutes of moderate intensity exercise weekly  Follow Up Plan: Telephone follow up appointment with care management team member scheduled for: 5 months       Patient verbalizes understanding of instructions and care plan provided today and agrees to view in [place]. Active MyChart status and patient understanding of how to access instructions and care plan via MyChart confirmed with patient.    Telephone follow up appointment with pharmacy team member scheduled for: 4 months  Viona Gilmore, [facility]

## 2022-09-14 ENCOUNTER — Other Ambulatory Visit: Payer: Self-pay | Admitting: Gastroenterology

## 2022-09-14 DIAGNOSIS — K219 Gastro-esophageal reflux disease without esophagitis: Secondary | ICD-10-CM

## 2022-09-19 ENCOUNTER — Encounter: Payer: Self-pay | Admitting: Cardiovascular Disease

## 2022-09-19 ENCOUNTER — Encounter: Payer: Self-pay | Admitting: Internal Medicine

## 2022-09-19 DIAGNOSIS — E118 Type 2 diabetes mellitus with unspecified complications: Secondary | ICD-10-CM

## 2022-09-19 MED ORDER — EMPAGLIFLOZIN 10 MG PO TABS: 10.0000 mg | ORAL_TABLET | Freq: Every day | ORAL | 3 refills | Status: DC

## 2022-09-19 MED ORDER — NITROGLYCERIN 0.4 MG SL SUBL: 0.4000 mg | SUBLINGUAL_TABLET | SUBLINGUAL | 3 refills | Status: DC | PRN

## 2022-09-19 MED ORDER — ACCU-CHEK SOFTCLIX LANCETS MISC: 3 refills | Status: DC

## 2022-09-19 MED ORDER — ACCU-CHEK GUIDE VI STRP: ORAL_STRIP | 3 refills | Status: DC

## 2022-09-28 DIAGNOSIS — E118 Type 2 diabetes mellitus with unspecified complications: Secondary | ICD-10-CM

## 2022-09-28 DIAGNOSIS — I10 Essential (primary) hypertension: Secondary | ICD-10-CM

## 2022-10-10 ENCOUNTER — Encounter: Payer: Self-pay | Admitting: Family Medicine

## 2022-10-10 ENCOUNTER — Ambulatory Visit (INDEPENDENT_AMBULATORY_CARE_PROVIDER_SITE_OTHER): Payer: Medicare HMO | Admitting: Family Medicine

## 2022-10-10 VITALS — BP 130/68 | HR 76 | Temp 97.7°F | Ht 67.0 in | Wt 195.0 lb

## 2022-10-10 DIAGNOSIS — D508 Other iron deficiency anemias: Secondary | ICD-10-CM | POA: Diagnosis not present

## 2022-10-10 DIAGNOSIS — I25708 Atherosclerosis of coronary artery bypass graft(s), unspecified, with other forms of angina pectoris: Secondary | ICD-10-CM | POA: Diagnosis not present

## 2022-10-10 DIAGNOSIS — I5042 Chronic combined systolic (congestive) and diastolic (congestive) heart failure: Secondary | ICD-10-CM

## 2022-10-10 DIAGNOSIS — D472 Monoclonal gammopathy: Secondary | ICD-10-CM

## 2022-10-10 DIAGNOSIS — E291 Testicular hypofunction: Secondary | ICD-10-CM | POA: Diagnosis not present

## 2022-10-10 DIAGNOSIS — N138 Other obstructive and reflux uropathy: Secondary | ICD-10-CM | POA: Diagnosis not present

## 2022-10-10 DIAGNOSIS — K219 Gastro-esophageal reflux disease without esophagitis: Secondary | ICD-10-CM | POA: Diagnosis not present

## 2022-10-10 DIAGNOSIS — N521 Erectile dysfunction due to diseases classified elsewhere: Secondary | ICD-10-CM

## 2022-10-10 DIAGNOSIS — I10 Essential (primary) hypertension: Secondary | ICD-10-CM | POA: Diagnosis not present

## 2022-10-10 DIAGNOSIS — E118 Type 2 diabetes mellitus with unspecified complications: Secondary | ICD-10-CM | POA: Diagnosis not present

## 2022-10-10 DIAGNOSIS — N401 Enlarged prostate with lower urinary tract symptoms: Secondary | ICD-10-CM | POA: Diagnosis not present

## 2022-10-10 DIAGNOSIS — E7800 Pure hypercholesterolemia, unspecified: Secondary | ICD-10-CM

## 2022-10-10 LAB — LIPID PANEL
Cholesterol: 135 mg/dL (ref 0–200)
HDL: 52.4 mg/dL (ref >39.00)
NonHDL: 82.99
Total CHOL/HDL Ratio: 3
Triglycerides: 219 mg/dL — ABNORMAL HIGH (ref 0.0–149.0)
VLDL: 43.8 mg/dL — ABNORMAL HIGH (ref 0.0–40.0)

## 2022-10-10 LAB — HEPATIC FUNCTION PANEL
ALT: 27 U/L (ref 0–53)
AST: 30 U/L (ref 0–37)
Albumin: 4.3 g/dL (ref 3.5–5.2)
Alkaline Phosphatase: 33 U/L — ABNORMAL LOW (ref 39–117)
Bilirubin, Direct: 0.1 mg/dL (ref 0.0–0.3)
Total Bilirubin: 0.4 mg/dL (ref 0.2–1.2)
Total Protein: 7.6 g/dL (ref 6.0–8.3)

## 2022-10-10 LAB — BASIC METABOLIC PANEL
BUN: 22 mg/dL (ref 6–23)
CO2: 26 mEq/L (ref 19–32)
Calcium: 9.4 mg/dL (ref 8.4–10.5)
Chloride: 105 mEq/L (ref 96–112)
Creatinine, Ser: 1.28 mg/dL (ref 0.40–1.50)
GFR: 57.23 mL/min — ABNORMAL LOW (ref >60.00)
Glucose, Bld: 99 mg/dL (ref 70–99)
Potassium: 4.2 mEq/L (ref 3.5–5.1)
Sodium: 137 mEq/L (ref 135–145)

## 2022-10-10 LAB — CBC
HCT: 37.3 % — ABNORMAL LOW (ref 39.0–52.0)
Hemoglobin: 12.2 g/dL — ABNORMAL LOW (ref 13.0–17.0)
MCHC: 32.8 g/dL (ref 30.0–36.0)
MCV: 86.5 fl (ref 78.0–100.0)
Platelets: 133 10*3/uL — ABNORMAL LOW (ref 150.0–400.0)
RBC: 4.32 Mil/uL (ref 4.22–5.81)
RDW: 15.4 % (ref 11.5–15.5)
WBC: 3.6 10*3/uL — ABNORMAL LOW (ref 4.0–10.5)

## 2022-10-10 LAB — PSA: PSA: 0.37 ng/mL (ref 0.10–4.00)

## 2022-10-10 LAB — LDL CHOLESTEROL, DIRECT: Direct LDL: 43 mg/dL

## 2022-10-10 LAB — HEMOGLOBIN A1C: Hgb A1c MFr Bld: 8 % — ABNORMAL HIGH (ref 4.6–6.5)

## 2022-10-10 LAB — TSH: TSH: 0.58 u[IU]/mL (ref 0.35–5.50)

## 2022-10-10 MED ORDER — SILDENAFIL CITRATE 100 MG PO TABS: 100.0000 mg | ORAL_TABLET | ORAL | 5 refills | Status: DC | PRN

## 2022-10-10 NOTE — Progress Notes (Signed)
Subjective:    Patient ID: Jerome Vaughn, male    DOB: 10-27-1950, 72 y.o.   MRN: [id number]  HPI Here to follow up on issues. Overall he thinks he is doing well. He sees Dr. Kelton Pillar for care of his diabetes. He sees Dr. Sallyanne Kuster for cardiology care. He gets yearly foot exams with Dr. Daylene Katayama of podiatry. He gets yearly eye exams at [facility]. His CAD and HTN are stable. 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 His GERD is stable. He does have some mild numbness and tingling in both hands and both feet.    Review of Systems  Constitutional: Negative.   HENT: Negative.    Eyes: Negative.   Respiratory: Negative.    Cardiovascular: Negative.   Gastrointestinal: Negative.   Genitourinary: Negative.   Musculoskeletal: Negative.   Skin: Negative.   Neurological:  Positive for numbness.  Psychiatric/Behavioral: Negative.         Objective:   Physical Exam Constitutional:      General: He is not in acute distress.    Appearance: Normal appearance. He is well-developed. He is not diaphoretic.  HENT:     Head: Normocephalic and atraumatic.     Right Ear: External ear normal.     Left Ear: External ear normal.     Nose: Nose normal.     Mouth/Throat:     Pharynx: No oropharyngeal exudate.  Eyes:     General: No scleral icterus.       Right eye: No discharge.        Left eye: No discharge.     Conjunctiva/sclera: Conjunctivae normal.     Pupils: Pupils are equal, round, and reactive to light.  Neck:     Thyroid: No thyromegaly.     Vascular: No JVD.     Trachea: No tracheal deviation.  Cardiovascular:     Rate and Rhythm: Normal rate and regular rhythm.     Heart sounds: Normal heart sounds. No murmur heard.    No friction rub. No gallop.  Pulmonary:     Effort: Pulmonary effort is normal. No respiratory distress.     Breath sounds: Normal breath sounds. No wheezing or rales.  Chest:     Chest wall: No tenderness.  Abdominal:     General: Bowel sounds are normal.  There is no distension.     Palpations: Abdomen is soft. There is no mass.     Tenderness: There is no abdominal tenderness. There is no guarding or rebound.  Genitourinary:    Penis: Normal. No tenderness.      Testes: Normal.     Prostate: Normal.     Rectum: Normal. Guaiac result negative.  Musculoskeletal:        General: No tenderness. Normal range of motion.     Cervical back: Neck supple.  Lymphadenopathy:     Cervical: No cervical adenopathy.  Skin:    General: Skin is warm and dry.     Coloration: Skin is not pale.     Findings: No erythema or rash.  Neurological:     Mental Status: He is alert and oriented to person, place, and time.     Cranial Nerves: No cranial nerve deficit.     Motor: No abnormal muscle tone.     Coordination: Coordination normal.     Deep Tendon Reflexes: Reflexes are normal and symmetric. Reflexes normal.  Psychiatric:        Behavior: Behavior normal.  Thought Content: Thought content normal.        Judgment: Judgment normal.           Assessment & Plan:  His CAD and HTN are stable. His diabetes is somewhat controlled. We will check an A1c today. He will be due for another colonoscopy with Dr. Tarri Glenn in May. His ED is stable. His GERD is stable. Get fasting labs for lipids, etc. We spent a total of (33   ) minutes reviewing records and discussing these issues.  Alysia Penna, MD

## 2022-10-17 ENCOUNTER — Other Ambulatory Visit: Payer: Self-pay | Admitting: Hematology and Oncology

## 2022-10-17 ENCOUNTER — Inpatient Hospital Stay: Payer: Medicare HMO | Attending: Hematology and Oncology

## 2022-10-17 ENCOUNTER — Inpatient Hospital Stay (HOSPITAL_BASED_OUTPATIENT_CLINIC_OR_DEPARTMENT_OTHER): Payer: Medicare HMO | Admitting: Hematology and Oncology

## 2022-10-17 VITALS — BP 142/82 | HR 73 | Temp 97.8°F | Resp 16 | Wt 197.3 lb

## 2022-10-17 DIAGNOSIS — D472 Monoclonal gammopathy: Secondary | ICD-10-CM

## 2022-10-17 DIAGNOSIS — Z79899 Other long term (current) drug therapy: Secondary | ICD-10-CM | POA: Diagnosis not present

## 2022-10-17 DIAGNOSIS — D500 Iron deficiency anemia secondary to blood loss (chronic): Secondary | ICD-10-CM

## 2022-10-17 DIAGNOSIS — D696 Thrombocytopenia, unspecified: Secondary | ICD-10-CM | POA: Diagnosis not present

## 2022-10-17 LAB — FERRITIN: Ferritin: 21 ng/mL — ABNORMAL LOW (ref 24–336)

## 2022-10-17 LAB — IRON AND IRON BINDING CAPACITY (CC-WL,HP ONLY)
Iron: 261 ug/dL — ABNORMAL HIGH (ref 45–182)
Saturation Ratios: 61 % — ABNORMAL HIGH (ref 17.9–39.5)
TIBC: 431 ug/dL (ref 250–450)
UIBC: 170 ug/dL (ref 117–376)

## 2022-10-17 LAB — CMP (CANCER CENTER ONLY)
ALT: 26 U/L (ref 0–44)
AST: 26 U/L (ref 15–41)
Albumin: 4.1 g/dL (ref 3.5–5.0)
Alkaline Phosphatase: 31 U/L — ABNORMAL LOW (ref 38–126)
Anion gap: 7 (ref 5–15)
BUN: 13 mg/dL (ref 8–23)
CO2: 24 mmol/L (ref 22–32)
Calcium: 9.4 mg/dL (ref 8.9–10.3)
Chloride: 106 mmol/L (ref 98–111)
Creatinine: 1.09 mg/dL (ref 0.61–1.24)
GFR, Estimated: >60 mL/min (ref >60)
Glucose, Bld: 104 mg/dL — ABNORMAL HIGH (ref 70–99)
Potassium: 4.5 mmol/L (ref 3.5–5.1)
Sodium: 137 mmol/L (ref 135–145)
Total Bilirubin: 0.7 mg/dL (ref 0.3–1.2)
Total Protein: 7.7 g/dL (ref 6.5–8.1)

## 2022-10-17 LAB — CBC WITH DIFFERENTIAL (CANCER CENTER ONLY)
Abs Immature Granulocytes: 0.01 10*3/uL (ref 0.00–0.07)
Basophils Absolute: 0 10*3/uL (ref 0.0–0.1)
Basophils Relative: 0 %
Eosinophils Absolute: 0.1 10*3/uL (ref 0.0–0.5)
Eosinophils Relative: 1 %
HCT: 36.8 % — ABNORMAL LOW (ref 39.0–52.0)
Hemoglobin: 12.3 g/dL — ABNORMAL LOW (ref 13.0–17.0)
Immature Granulocytes: 0 %
Lymphocytes Relative: 34 %
Lymphs Abs: 1.3 10*3/uL (ref 0.7–4.0)
MCH: 28.9 pg (ref 26.0–34.0)
MCHC: 33.4 g/dL (ref 30.0–36.0)
MCV: 86.4 fL (ref 80.0–100.0)
Monocytes Absolute: 0.4 10*3/uL (ref 0.1–1.0)
Monocytes Relative: 10 %
Neutro Abs: 2.1 10*3/uL (ref 1.7–7.7)
Neutrophils Relative %: 55 %
Platelet Count: 102 10*3/uL — ABNORMAL LOW (ref 150–400)
RBC: 4.26 MIL/uL (ref 4.22–5.81)
RDW: 15.4 % (ref 11.5–15.5)
Smear Review: NORMAL
WBC Count: 3.9 10*3/uL — ABNORMAL LOW (ref 4.0–10.5)
nRBC: 0 % (ref 0.0–0.2)

## 2022-10-17 LAB — LACTATE DEHYDROGENASE: LDH: 201 U/L — ABNORMAL HIGH (ref 98–192)

## 2022-10-17 NOTE — Progress Notes (Signed)
[place] [phone]) [phone]   [phone]  PROGRESS NOTE  Patient Care Team: [name], MD as PCP - General (Family Medicine) [name], MD as PCP - Cardiology (Cardiology) Thornton Park, MD as Consulting Physician (Gastroenterology) [name], MD (Inactive) as Consulting Physician (Endocrinology) Orson Slick, MD as Consulting Physician (Hematology and Oncology) Viona Gilmore, [facility] (Inactive) as Pharmacist (Pharmacist)  Hematological/Oncological History # IgG Lambda MGUS    (a) SPEP 02/22/2020 shows an M spike of 1.2%, IFE confirms IgG lambda clonality             (b) kappa lambda ratio 09/26/2020 was normal at 0.93.             (c) 24 hr urine shows the total protein not to be elevated.  Free lambda light chains were 41 mg/L             (d) beta-2 microglobulin 09/26/2020 was normal at 1.8 04/11/2022: establish care with Dr. Lorenso Courier, transition care from Dr. Jana Hakim.   Interval History:  Jerome Vaughn 72 y.o. 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 The patient's last visit was on 04/11/2022. In the interim since the last visit he has had no major changes in his health.  On exam today Jerome Vaughn reports he has had no major changes in his health in the last 6 months since his prior visit.  He reports that he did however have a torn rotator cuff and some knee inflammation which was treated with a steroid injection.  He reports that his energy levels are good though does decline throughout the [place].  He works as a Freight forwarder.  He does have his usual aches and pains but no new bone or back pain.  He reports he is also not having any trouble with bleeding or bruising.  His appetite has been strong though he is undergoing a fast with his church which is known as the "[place]illian Quince fast".  He notes he is taking his pills of iron every [place].  Part of  his fast is meatless and he is eating lots of veggies.  He notes if he does need IV iron therapy Fridays would be best.  He notes that he is otherwise had no fevers, chills, sweats, nausea, vomiting or diarrhea.  A full 10 point ROS is listed below.  MEDICAL HISTORY:  Past Medical History:  Diagnosis Date   Carpal tunnel syndrome    both hands   Colon polyps    Coronary artery disease    sees Dr. Sallyanne Kuster   Diabetes mellitus    sees Dr. Vivia Ewing   GERD (gastroesophageal reflux disease)    History of echocardiogram    a. Echo 4/17: Moderate LVH, EF 50-55%, mild LAE, no pericardial effusion   Hyperlipidemia    Hypertension    MGUS (monoclonal gammopathy of unknown significance)    sees Dr. Narda Rutherford   Neuropathy    gets foot exams with Dr. Daylene Katayama (podiatry)   Pneumonia     SURGICAL HISTORY: Past Surgical History:  Procedure Laterality Date   CARDIAC CATHETERIZATION N/A 11/21/2015   Procedure: Left Heart Cath and Coronary Angiography;  Surgeon: [name], MD;  Location: [place] [facility];  Service: Cardiovascular;  Laterality: N/A;   COLONOSCOPY  02/23/2020   per Dr. Tarri Glenn, tubular adenomas, repeat in 3 yrs    colonsocopy  12/07/2009   per Dr. [name], benign polyps, repeat in 10 yrs    CORONARY ARTERY BYPASS GRAFT N/A 11/22/2015   Procedure: CORONARY ARTERY BYPASS GRAFTING (CABG) times five using the left internal mammary, right greater saphenous vein EVH, and left thigh greater saphenous vein EVH;  Surgeon: [name], MD;  Location: [place];  Service: Open Heart Surgery;  Laterality: N/A;   frozen shoulder release Right    INTRAVASCULAR PRESSURE WIRE/FFR STUDY N/A 12/14/2018   Procedure: INTRAVASCULAR PRESSURE WIRE/FFR STUDY;  Surgeon: Jettie Booze, MD;  Location: [place] [facility];  Service: Cardiovascular;  Laterality: N/A;   LEFT HEART CATH AND CORS/GRAFTS ANGIOGRAPHY N/A 10/27/2017   Procedure: LEFT HEART CATH AND CORS/GRAFTS ANGIOGRAPHY;   Surgeon: [name], MD;  Location: [place] [facility];  Service: Cardiovascular;  Laterality: N/A;   LEFT HEART CATH AND CORS/GRAFTS ANGIOGRAPHY N/A 12/14/2018   Procedure: LEFT HEART CATH AND CORS/GRAFTS ANGIOGRAPHY;  Surgeon: Jettie Booze, MD;  Location: [place] [facility];  Service: Cardiovascular;  Laterality: N/A;   TEE WITHOUT CARDIOVERSION N/A 11/22/2015   Procedure: TRANSESOPHAGEAL ECHOCARDIOGRAM (TEE);  Surgeon: [name], MD;  Location: [place];  Service: Open Heart Surgery;  Laterality: N/A;   WRIST GANGLION EXCISION Right     SOCIAL HISTORY: Social History   Socioeconomic History   Marital status: Married    Spouse name: Not on file   Number of children: 5   Years of education: Not on file   Highest education level: 12th grade  Occupational History   Occupation: Counsellor  Tobacco Use   Smoking status: Former    Packs/Choo: 0.25    Years: 20.00    Total pack years: 5.00    Types: Cigarettes    Quit date: 10/07/2013    Years since quitting: 9.0   Smokeless tobacco: Never   Tobacco comments:    quit cigarettes, might have a cigar 1 X month  Vaping Use   Vaping Use: Never used  Substance and Sexual Activity   Alcohol use: Yes    Alcohol/week: 3.0 - 4.0 standard drinks of alcohol    Types: 3 - 4 Standard drinks or equivalent per week   Drug use: No   Sexual activity: Not Currently    Birth control/protection: None  Other Topics Concern   Not on file  Social History Narrative   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)   Married   5 kids   6 grand, 2 great grand   Social Determinants of Health   Financial Resource Strain: Low Risk  (05/19/2022)   Overall Financial Resource Strain (CARDIA)    Difficulty of Paying Living Expenses: Not very hard  Food Insecurity: No Food Insecurity (04/17/2022)   Hunger Vital Sign    Worried About Running Out of Food in the Last Year: Never true    [place] in the Last Year: Never true  Transportation Needs: No Transportation Needs (04/17/2022)   PRAPARE - Hydrologist (Medical): No    Lack of Transportation (Non-Medical): No  Physical Activity: Sufficiently Active (04/17/2022)   Exercise Vital Sign    Days of Exercise per Week: 7 days    Minutes of Exercise per Session: 150+ min  Stress: No Stress Concern Present (04/17/2022)   [place]    Feeling of Stress : Not at all  Social Connections: Socially Integrated (04/17/2022)   Social Connection and Isolation Panel [NHANES]    Frequency of Communication with Friends and Family: More than three times a week    Frequency of Social Gatherings with Friends and Family: Three times a week    Attends Religious Services: More than 4 times per year    Active Member of Clubs or Organizations: Yes    Attends Archivist Meetings: More than 4 times per year    Marital Status: Married  Human resources officer Violence: Not At Risk (11/29/2021)   Humiliation, Afraid, Rape, and Kick questionnaire    Fear of Current or Ex-Partner: No    Emotionally Abused: No    Physically Abused: No    Sexually Abused: No    FAMILY HISTORY: Family History  Problem Relation Age of Onset   Heart failure Father    Anuerysm Brother        AAA   Hyperlipidemia Sister    Diabetes Sister    Hypertension Sister    Kidney failure Sister    Diabetes Maternal Grandmother    Hyperlipidemia Sister    Diabetes Sister    Colon polyps Sister    Kidney disease Sister    Other Brother        COVID 34   Diabetes Son    Heart disease Son    Heart attack Neg Hx    Colon cancer Neg Hx    Stomach cancer Neg Hx    Esophageal cancer Neg Hx     ALLERGIES:  has No Known Allergies.  MEDICATIONS:  Current Outpatient Medications  Medication Sig Dispense Refill   Accu-Chek Softclix Lancets lancets Use to monitor glucose levels  1X daily; E11.8 100 each 3   acetaminophen (TYLENOL) 500 MG tablet Take 500 mg by mouth as needed.     APPLE CIDER VINEGAR PO Take by mouth daily.      Ascorbic Acid (VITAMIN C PO) Take 1 tablet by mouth daily.     aspirin EC 81 MG tablet Take 1 tablet (81 mg total) by mouth daily.     benazepril (LOTENSIN) 20 MG tablet TAKE 1 TABLET EVERY [place] 90 tablet 3   Blood Glucose Monitoring Suppl (ACCU-CHEK AVIVA PLUS) w/Device KIT 1 each by Does not apply route daily. Use to monitor glucose levels daily; E11.8 1 kit 0   bromocriptine (PARLODEL) 2.5 MG tablet Take 1 tablet (2.5 mg total) by mouth daily. 90 tablet 3   carvedilol (COREG) 3.125 MG tablet Take 1 tablet (3.125 mg total) by mouth 2 (two) times daily with a meal. 180 tablet 3   Cinnamon 500 MG capsule Take 1 capsule by mouth daily.     Coenzyme Q10 (COQ-10) 100 MG CAPS Take 1 tablet by mouth daily.     Cyanocobalamin (VITAMIN B-12 PO) Take 1 tablet by mouth daily.     empagliflozin (JARDIANCE) 10 MG TABS tablet Take 1 tablet (10 mg total) by mouth daily before breakfast. 30 tablet 3   ezetimibe (ZETIA) 10 MG tablet Take 1 tablet (10 mg total) by mouth daily. 90 tablet 3   ferrous sulfate 325 (65 FE) MG tablet Take 325 mg by mouth daily with breakfast.     Garlic 6283 MG CAPS Take 1 tablet by mouth daily.     glucose blood (ACCU-CHEK GUIDE) test strip Use to monitor glucose levels 1X daily; E11.8 100 each 3   meloxicam (MOBIC) 15 MG tablet Take 15 mg by mouth daily.  metFORMIN (GLUCOPHAGE-XR) 500 MG 24 hr tablet Take 4 tablets (2,000 mg total) by mouth daily. 360 tablet 3   Multiple Vitamin (MULTIVITAMIN) tablet Take 1 tablet by mouth daily.     nitroGLYCERIN (NITROSTAT) 0.4 MG SL tablet Place 1 tablet (0.4 mg total) under the tongue every 5 (five) minutes as needed for chest pain. 25 tablet 3   Omega-3 Fatty Acids (FISH OIL PO) Take 1 capsule by mouth daily.     pantoprazole (PROTONIX) 40 MG tablet Take 1 tablet (40 mg total) by mouth 2  (two) times daily. Patient needs office visit. 180 tablet 0   ranolazine (RANEXA) 500 MG 12 hr tablet Take 1 tablet (500 mg total) by mouth 2 (two) times daily. 180 tablet 1   repaglinide (PRANDIN) 2 MG tablet Take 1 tablet (2 mg total) by mouth 2 (two) times daily before a meal. 180 tablet 3   rosuvastatin (CRESTOR) 40 MG tablet Take 1 tablet (40 mg total) by mouth daily. 90 tablet 1   sildenafil (VIAGRA) 100 MG tablet Take 1 tablet (100 mg total) by mouth as needed for erectile dysfunction. 30 tablet 5   tadalafil (CIALIS) 20 MG tablet Take 1 tablet (20 mg total) by mouth as needed for erectile dysfunction. 20 tablet 11   VITAMIN E PO Take 1 capsule by mouth daily.     No current facility-administered medications for this visit.    REVIEW OF SYSTEMS:   Constitutional: ( - ) fevers, ( - )  chills , ( - ) night sweats Eyes: ( - ) blurriness of vision, ( - ) double vision, ( - ) watery eyes Ears, nose, mouth, throat, and face: ( - ) mucositis, ( - ) sore throat Respiratory: ( - ) cough, ( - ) dyspnea, ( - ) wheezes Cardiovascular: ( - ) palpitation, ( - ) chest discomfort, ( - ) lower extremity swelling Gastrointestinal:  ( - ) nausea, ( - ) heartburn, ( - ) change in bowel habits Skin: ( - ) abnormal skin rashes Lymphatics: ( - ) new lymphadenopathy, ( - ) easy bruising Neurological: ( - ) numbness, ( - ) tingling, ( - ) new weaknesses Behavioral/Psych: ( - ) mood change, ( - ) new changes  All other systems were reviewed with the patient and are negative.  PHYSICAL EXAMINATION: ECOG PERFORMANCE STATUS: 0 - Asymptomatic  Vitals:   10/17/22 1032  BP: (!) 142/82  Pulse: 73  Resp: 16  Temp: 97.8 F (36.6 C)  SpO2: 96%   Filed Weights   10/17/22 1032  Weight: 197 lb 4.8 oz (89.5 kg)    GENERAL: Well-appearing elderly African-American male, alert, no distress and comfortable SKIN: skin color, texture, turgor are normal, no rashes or significant lesions EYES: conjunctiva are pink  and non-injected, sclera clear LUNGS: clear to auscultation and percussion with normal breathing effort HEART: regular rate & rhythm and no murmurs and no lower extremity edema Musculoskeletal: no cyanosis of digits and no clubbing  PSYCH: alert & oriented x 3, fluent speech NEURO: no focal motor/sensory deficits  LABORATORY DATA:  I have reviewed the data as listed    Latest Ref Rng & Units 10/17/2022   10:24 AM 10/10/2022   10:41 AM 07/11/2022   10:21 AM  CBC  WBC 4.0 - 10.5 K/uL 3.9  3.6  3.8   Hemoglobin 13.0 - 17.0 g/dL 12.3  12.2  12.2   Hematocrit 39.0 - 52.0 % 36.8  37.3  36.2  Platelets 150 - 400 K/uL 102  133.0  126        Latest Ref Rng & Units 10/17/2022   10:24 AM 10/10/2022   10:41 AM 07/11/2022   10:21 AM  CMP  Glucose 70 - 99 mg/dL 104  99  202   BUN 8 - 23 mg/dL '13  22  17   '$ Creatinine 0.61 - 1.24 mg/dL 1.09  1.28  1.17   Sodium 135 - 145 mmol/L 137  137  135   Potassium 3.5 - 5.1 mmol/L 4.5  4.2  4.6   Chloride 98 - 111 mmol/L 106  105  103   CO2 22 - 32 mmol/L '24  26  25   '$ Calcium 8.9 - 10.3 mg/dL 9.4  9.4  8.9   Total Protein 6.5 - 8.1 g/dL 7.7  7.6  8.0   Total Bilirubin 0.3 - 1.2 mg/dL 0.7  0.4  0.4   Alkaline Phos 38 - 126 U/L 31  33  33   AST 15 - 41 U/L '26  30  27   '$ ALT 0 - 44 U/L '26  27  24     '$ Lab Results  Component Value Date   MPROTEIN 1.1 (H) 07/11/2022   MPROTEIN 1.1 (H) 04/04/2022   MPROTEIN 1.3 (H) 10/04/2021   Lab Results  Component Value Date   KPAFRELGTCHN 22.7 (H) 07/11/2022   KPAFRELGTCHN 23.0 (H) 04/04/2022   KPAFRELGTCHN 29.2 (H) 10/04/2021   LAMBDASER 22.7 07/11/2022   LAMBDASER 21.6 04/04/2022   LAMBDASER 25.9 10/04/2021   KAPLAMBRATIO 1.00 07/11/2022   KAPLAMBRATIO 1.06 04/04/2022   KAPLAMBRATIO 1.13 10/04/2021   RADIOGRAPHIC STUDIES: No results found.  ASSESSMENT & PLAN Jerome Vaughn 72 y.o. 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   #IgG Lambda Monoclonal Gammopathy of Undetermined Significance --today will order an SPEP, SFLC and beta 2 microglobulin --additionally will collect new baseline CBC, CMP, and LDH --recommend a metastatic bone survey to assess for lytic lesions at next visit.  --will consider the need for a bone marrow biopsy pending the above results --RTC in 6 months time or sooner if needed based on labs.   #Thrombocytopenia/Anemia -- stable from prior.  Hemoglobin today 12.3 with platelets of 102 --if iron levels are low will proceed with IV iron therapy.  --RTC 4-6 weeks after last dose of IV iron to assure it working.    No orders of the defined types were placed in this encounter.   All questions were answered. The patient knows to call the clinic with any problems, questions or concerns.  A total of more than 30 minutes were spent on this encounter with face-to-face time and non-face-to-face time, including preparing to see the patient, ordering tests and/or medications, counseling the patient and coordination of care as outlined above.   Ledell Peoples, MD Department of Hematology/Oncology [place] [facility] Phone: [phone] Pager: [phone] Email: [email or web address]  10/19/2022 5:10 PM

## 2022-10-18 LAB — BETA 2 MICROGLOBULIN, SERUM: Beta-2 Microglobulin: 2 mg/L (ref 0.6–2.4)

## 2022-10-19 ENCOUNTER — Encounter: Payer: Self-pay | Admitting: Oncology

## 2022-10-19 ENCOUNTER — Encounter: Payer: Self-pay | Admitting: Hematology and Oncology

## 2022-10-20 LAB — KAPPA/LAMBDA LIGHT CHAINS
Kappa free light chain: 30.4 mg/L — ABNORMAL HIGH (ref 3.3–19.4)
Kappa, lambda light chain ratio: 1.11 (ref 0.26–1.65)
Lambda free light chains: 27.4 mg/L — ABNORMAL HIGH (ref 5.7–26.3)

## 2022-10-21 LAB — MULTIPLE MYELOMA PANEL, SERUM
Albumin SerPl Elph-Mcnc: 4.1 g/dL (ref 2.9–4.4)
Albumin/Glob SerPl: 1.2 (ref 0.7–1.7)
Alpha 1: 0.2 g/dL (ref 0.0–0.4)
Alpha2 Glob SerPl Elph-Mcnc: 0.7 g/dL (ref 0.4–1.0)
B-Globulin SerPl Elph-Mcnc: 0.9 g/dL (ref 0.7–1.3)
Gamma Glob SerPl Elph-Mcnc: 1.7 g/dL (ref 0.4–1.8)
Globulin, Total: 3.5 g/dL (ref 2.2–3.9)
IgA: 95 mg/dL (ref 61–437)
IgG (Immunoglobin G), Serum: 1884 mg/dL — ABNORMAL HIGH (ref 603–1613)
IgM (Immunoglobulin M), Srm: 32 mg/dL (ref 20–172)
M Protein SerPl Elph-Mcnc: 1 g/dL — ABNORMAL HIGH
Total Protein ELP: 7.6 g/dL (ref 6.0–8.5)

## 2022-10-24 ENCOUNTER — Telehealth: Payer: Self-pay | Admitting: Pharmacy Technician

## 2022-10-24 NOTE — Telephone Encounter (Addendum)
Dr. Lorenso Courier,  Monoferric is non preferred medication with [facility] and will be denied if patient has not failed or tried venofer.  Preferred medication is venofer? Would you like to try venofer?  Ref: [id number] Phone: [phone]

## 2022-10-27 ENCOUNTER — Other Ambulatory Visit: Payer: Self-pay | Admitting: Pharmacy Technician

## 2022-10-31 ENCOUNTER — Ambulatory Visit (INDEPENDENT_AMBULATORY_CARE_PROVIDER_SITE_OTHER): Payer: Medicare HMO | Admitting: *Deleted

## 2022-10-31 VITALS — BP 129/72 | HR 77 | Temp 97.6°F | Resp 18 | Ht 67.0 in | Wt 186.0 lb

## 2022-10-31 DIAGNOSIS — D508 Other iron deficiency anemias: Secondary | ICD-10-CM

## 2022-10-31 MED ORDER — DIPHENHYDRAMINE HCL 25 MG PO CAPS
25.0000 mg | ORAL_CAPSULE | Freq: Once | ORAL | Status: AC
  Administered 2022-10-31: 25 mg via ORAL
  Filled 2022-10-31: qty 1

## 2022-10-31 MED ORDER — ACETAMINOPHEN 325 MG PO TABS
650.0000 mg | ORAL_TABLET | Freq: Once | ORAL | Status: AC
  Administered 2022-10-31: 650 mg via ORAL
  Filled 2022-10-31: qty 2

## 2022-10-31 MED ORDER — SODIUM CHLORIDE 0.9 % IV SOLN
200.0000 mg | Freq: Once | INTRAVENOUS | Status: AC
  Administered 2022-10-31: 200 mg via INTRAVENOUS
  Filled 2022-10-31: qty 10

## 2022-10-31 NOTE — Progress Notes (Signed)
Diagnosis: Iron Deficiency Anemia  Provider:  Marshell Garfinkel MD  Procedure: Infusion  IV Type: Peripheral, IV Location: L wrist  Venofer (Iron Sucrose), Dose: 200 mg  Infusion Start Time: 4695  Infusion Stop Time: 0722  Post Infusion IV Care: Observation period completed and Peripheral IV Discontinued  Discharge: Condition: Good, Destination: Home . AVS provided to patient.   Performed by:  Baxter Hire, RN

## 2022-11-07 ENCOUNTER — Ambulatory Visit (INDEPENDENT_AMBULATORY_CARE_PROVIDER_SITE_OTHER): Payer: Medicare HMO

## 2022-11-07 VITALS — BP 99/62 | HR 72 | Temp 97.7°F | Resp 18 | Ht 67.0 in | Wt 189.4 lb

## 2022-11-07 DIAGNOSIS — D508 Other iron deficiency anemias: Secondary | ICD-10-CM | POA: Diagnosis not present

## 2022-11-07 MED ORDER — DIPHENHYDRAMINE HCL 25 MG PO CAPS
25.0000 mg | ORAL_CAPSULE | Freq: Once | ORAL | Status: AC
  Administered 2022-11-07: 25 mg via ORAL
  Filled 2022-11-07: qty 1

## 2022-11-07 MED ORDER — SODIUM CHLORIDE 0.9 % IV SOLN
200.0000 mg | Freq: Once | INTRAVENOUS | Status: AC
  Administered 2022-11-07: 200 mg via INTRAVENOUS
  Filled 2022-11-07: qty 10

## 2022-11-07 MED ORDER — ACETAMINOPHEN 325 MG PO TABS
650.0000 mg | ORAL_TABLET | Freq: Once | ORAL | Status: AC
  Administered 2022-11-07: 650 mg via ORAL
  Filled 2022-11-07: qty 2

## 2022-11-07 NOTE — Progress Notes (Signed)
Diagnosis: Iron Deficiency Anemia  Provider:  Marshell Garfinkel MD  Procedure: Infusion  IV Type: Peripheral, IV Location: L Forearm  Venofer (Iron Sucrose), Dose: 200 mg  Infusion Start Time: [id number]  Infusion Stop Time: [id number]  Post Infusion IV Care: Peripheral IV Discontinued  Discharge: Condition: Good, Destination: Home . AVS Declined  Performed by:  Adelina Mings, LPN

## 2022-11-12 ENCOUNTER — Ambulatory Visit: Payer: Medicare HMO | Admitting: Internal Medicine

## 2022-11-14 ENCOUNTER — Ambulatory Visit: Payer: Medicare HMO | Admitting: Internal Medicine

## 2022-11-14 ENCOUNTER — Telehealth: Payer: Self-pay | Admitting: Cardiovascular Disease

## 2022-11-14 MED ORDER — CARVEDILOL 3.125 MG PO TABS: 3.1250 mg | ORAL_TABLET | Freq: Two times a day (BID) | ORAL | 3 refills | Status: DC

## 2022-11-14 MED ORDER — NITROGLYCERIN 0.4 MG SL SUBL: 0.4000 mg | SUBLINGUAL_TABLET | SUBLINGUAL | 3 refills | Status: DC | PRN

## 2022-11-14 NOTE — Telephone Encounter (Signed)
*  STAT* If patient is at the pharmacy, call can be transferred to refill team.   1. Which medications need to be refilled? (please list name of each medication and dose if known) carvedilol (COREG) 3.125 MG tablet   nitroGLYCERIN (NITROSTAT) 0.4 MG SL tablet   2. Which pharmacy/location (including street and city if local pharmacy) is medication to be sent to? [id number] [place] [address]   3. Do they need a [address] or [address] supply? [address]

## 2022-11-14 NOTE — Telephone Encounter (Signed)
Refills haas been sent to the pharmacy.

## 2022-11-18 ENCOUNTER — Telehealth: Payer: Self-pay

## 2022-11-18 NOTE — Progress Notes (Signed)
Care Management & Coordination Services Pharmacy Team  Reason for Encounter: Hypertension  Contacted patient to discuss hypertension and diabetes disease state. Spoke with patient on 11/18/2022     Current antihypertensive regimen:  Benazepril 20 mg 1 tablet daily Carvedilol 3.125 mg 1 tablet twice daily  Patient verbally confirms he is taking the above medications as directed. Yes  How often are you checking your Blood Pressure? Patient is checking about once weekly  he checks his blood pressure at various times  Current home BP readings:  DATE:             BP               PULSE 10/19/22 142/82  72 10/31/22 129/72  74 11/07/22 99/62  72  Wrist or arm cuff: Arm  OTC medications including pseudoephedrine or NSAIDs? Patient denies  Any readings above 180/100? Patient denies  What recent interventions/DTPs have been made by any provider to improve Blood Pressure control since last CPP Visit: No recent interventions  Any recent hospitalizations or ED visits since last visit with CPP? No recent hospital visits  What diet changes have been made to improve Blood Pressure Control?  Patient follows a low sugar and cut carbs (Daniel fast through Jan) Breakfast - grits, boiled egg, sausage, avacado and OJ  Lunch - a light meal, soup with crackers Dinner - a meal with a meat and vegetable or salad Caffeine intake - 1 - 2 cups coffee  Salt intake - very lightly  What exercise is being done to improve your Blood Pressure Control?  Patient is still working 4 days per week, walks a lot at work    Current antihyperglycemic regimen:  Metformin 500 mg 4 tablets daily Ranolazine 500 mg twice daily Jardiance 10 mg daily Repaglinide 2 mg twice daily  Patient verbally confirms he is taking the above medications as directed. Yes  What diet changes have been made to improve diabetes control? Patient has cut back on sugars and carbs  What recent interventions/DTPs have been made to  improve glycemic control: No recent interventions  Have there been any recent hospitalizations or ED visits since last visit with PharmD? No recent hospital visits.   Patient denies hypoglycemic symptoms, including None  Patient denies hyperglycemic symptoms, including none  How often are you checking your blood sugar? once daily and getting into the habit of twice daily  What are your blood sugars ranging?  11/14/22 Fasting 127 11/15/22 Fasting 141 11/16/22 After dinner 220 11/17/22   Fasting 140 and after lunch 220  During the week, how often does your blood glucose drop below 70? Patient denies any readings below 70  Are you checking your feet daily/regularly? Yes  Adherence Review: Is the patient currently on a STATIN medication? Yes Is the patient currently on ACE/ARB medication? Yes Does the patient have >5 [place] gap between last estimated fill dates? No  Care Gaps: AWV - completed 11/29/2021,  scheduled 12/03/2022 Next appointment - 01/23/2023 Last foot exam - 06/14/2021 Last eye exam - 07/29/2022 Pneumovax - overdue Covid - overdue   Star Rating Drug: Benazepril 20 mg - last filled 10/11/2022 90 DS at [place] [facility] 10 mg  - last filled 09/06/2022 90 DS at [facility] Metformin 500 mg - last filled 09/15/2022 90 at [facility] verified with pharm tech.  Repaglinide 2 mg - last filled 11/06/2022 90 DS at [facility] Rosuvastatin 40 mg - last filled 10/11/2022 90 DS at [place]  Updates: Recent office visits:  10/10/2022 Alysia Penna MD - Patient was seen for essential hypertension and additional concerns. No medication changes.  Recent consult visits:  10/17/2022 Narda Rutherford MD (hem/onc) - Patient was seen for Monoclonal gammopathy of unknown significance and an additional concern. No medication changes.  Hospital visits:  None  Medications: Outpatient Encounter Medications as of 11/18/2022  Medication Sig   Accu-Chek Softclix Lancets lancets Use to monitor  glucose levels 1X daily; E11.8   acetaminophen (TYLENOL) 500 MG tablet Take 500 mg by mouth as needed.   APPLE CIDER VINEGAR PO Take by mouth daily.    Ascorbic Acid (VITAMIN C PO) Take 1 tablet by mouth daily.   aspirin EC 81 MG tablet Take 1 tablet (81 mg total) by mouth daily.   benazepril (LOTENSIN) 20 MG tablet TAKE 1 TABLET EVERY [place]   Blood Glucose Monitoring Suppl (ACCU-CHEK AVIVA PLUS) w/Device KIT 1 each by Does not apply route daily. Use to monitor glucose levels daily; E11.8   bromocriptine (PARLODEL) 2.5 MG tablet Take 1 tablet (2.5 mg total) by mouth daily.   carvedilol (COREG) 3.125 MG tablet Take 1 tablet (3.125 mg total) by mouth 2 (two) times daily with a meal.   Cinnamon 500 MG capsule Take 1 capsule by mouth daily.   Coenzyme Q10 (COQ-10) 100 MG CAPS Take 1 tablet by mouth daily.   Cyanocobalamin (VITAMIN B-12 PO) Take 1 tablet by mouth daily.   empagliflozin (JARDIANCE) 10 MG TABS tablet Take 1 tablet (10 mg total) by mouth daily before breakfast.   ezetimibe (ZETIA) 10 MG tablet Take 1 tablet (10 mg total) by mouth daily.   ferrous sulfate 325 (65 FE) MG tablet Take 325 mg by mouth daily with breakfast.   Garlic [id number] MG CAPS Take 1 tablet by mouth daily.   glucose blood (ACCU-CHEK GUIDE) test strip Use to monitor glucose levels 1X daily; E11.8   meloxicam (MOBIC) 15 MG tablet Take 15 mg by mouth daily.   metFORMIN (GLUCOPHAGE-XR) 500 MG 24 hr tablet Take 4 tablets (2,000 mg total) by mouth daily.   Multiple Vitamin (MULTIVITAMIN) tablet Take 1 tablet by mouth daily.   nitroGLYCERIN (NITROSTAT) 0.4 MG SL tablet Place 1 tablet (0.4 mg total) under the tongue every 5 (five) minutes as needed for chest pain.   Omega-3 Fatty Acids (FISH OIL PO) Take 1 capsule by mouth daily.   pantoprazole (PROTONIX) 40 MG tablet Take 1 tablet (40 mg total) by mouth 2 (two) times daily. Patient needs office visit.   ranolazine (RANEXA) 500 MG 12 hr tablet Take 1 tablet (500 mg total) by mouth  2 (two) times daily.   repaglinide (PRANDIN) 2 MG tablet Take 1 tablet (2 mg total) by mouth 2 (two) times daily before a meal.   rosuvastatin (CRESTOR) 40 MG tablet Take 1 tablet (40 mg total) by mouth daily.   sildenafil (VIAGRA) 100 MG tablet Take 1 tablet (100 mg total) by mouth as needed for erectile dysfunction.   tadalafil (CIALIS) 20 MG tablet Take 1 tablet (20 mg total) by mouth as needed for erectile dysfunction.   VITAMIN E PO Take 1 capsule by mouth daily.   No facility-administered encounter medications on file as of 11/18/2022.  Fill History:   Dispensed Days Supply Quantity Provider Pharmacy  TADALAFIL 5MG TABLET 07/11/2020 49 60 each      Dispensed Days Supply Quantity Provider Pharmacy  rosuvastatin 40 mg tablet 10/11/2022 90 90 tablet  Dispensed Days Supply Quantity Provider Pharmacy  repaglinide 2 mg tablet 11/06/2022 90 180 tablet      Dispensed Days Supply Quantity Provider Pharmacy  RANOLAZINE ER 500 MG TABLET 05/20/2022 90  [name], MD [place]...  RANOLAZINE ER 500 MG TABLET 01/13/2022 90  [name], MD [place]...  ranolazine (RANEXA) 12 hr tablet 07/31/2021 90 180      Dispensed Days Supply Quantity Provider Pharmacy  pantoprazole 40 mg tablet,delayed release 09/15/2022 90 180 tablet      Dispensed Days Supply Quantity Provider Pharmacy  metformin ER 500 mg tablet,extended release 24 hr 09/15/2022 90 360 tablet      Dispensed Days Supply Quantity Provider Pharmacy  meloxicam 15 mg tablet 11/07/2022 30 30 tablet      Dispensed Days Supply Quantity Provider Pharmacy  Jardiance 10 mg tablet 09/06/2022 90 90 tablet      Dispensed Days Supply Quantity Provider Pharmacy  CARVEDILOL 3.125 MG TABLET 11/14/2022 90 180 each      Dispensed Days Supply Quantity Provider Pharmacy  benazepril 20 mg tablet 10/11/2022 90 90 tablet      Recent Office Vitals: BP Readings from Last 3 Encounters:  11/07/22 99/62  10/31/22  129/72  10/17/22 (!) 142/82   Pulse Readings from Last 3 Encounters:  11/07/22 72  10/31/22 77  10/17/22 73    Wt Readings from Last 3 Encounters:  11/07/22 189 lb 6.4 oz (85.9 kg)  10/31/22 186 lb (84.4 kg)  10/17/22 197 lb 4.8 oz (89.5 kg)     Kidney Function Lab Results  Component Value Date/Time   CREATININE 1.09 10/17/2022 10:24 AM   CREATININE 1.28 10/10/2022 10:41 AM   CREATININE 1.17 07/11/2022 10:21 AM   CREATININE 1.19 11/14/2015 11:35 AM   GFR 57.23 (L) 10/10/2022 10:41 AM   GFRNONAA >60 10/17/2022 10:24 AM   GFRAA >60 06/14/2020 02:19 PM       Latest Ref Rng & Units 10/17/2022   10:24 AM 10/10/2022   10:41 AM 07/11/2022   10:21 AM  BMP  Glucose 70 - 99 mg/dL 104  99  202   BUN 8 - 23 mg/dL 13  22  17   $ Creatinine 0.61 - 1.24 mg/dL 1.09  1.28  1.17   Sodium 135 - 145 mmol/L 137  137  135   Potassium 3.5 - 5.1 mmol/L 4.5  4.2  4.6   Chloride 98 - 111 mmol/L 106  105  103   CO2 22 - 32 mmol/L 24  26  25   $ Calcium 8.9 - 10.3 mg/dL 9.4  9.4  8.9     Recent Relevant Labs: Lab Results  Component Value Date/Time   HGBA1C 8.0 (H) 10/10/2022 10:41 AM   HGBA1C 9.2 (H) 04/25/2022 11:19 AM   MICROALBUR 3.5 (H) 04/25/2022 11:19 AM   MICROALBUR 1.2 08/22/2014 10:34 AM    Kidney Function Lab Results  Component Value Date/Time   CREATININE 1.09 10/17/2022 10:24 AM   CREATININE 1.28 10/10/2022 10:41 AM   CREATININE 1.17 07/11/2022 10:21 AM   CREATININE 1.19 11/14/2015 11:35 AM   GFR 57.23 (L) 10/10/2022 10:41 AM   GFRNONAA >60 10/17/2022 10:24 AM   GFRAA >60 06/14/2020 02:19 PM   [place]  Clinical Pharmacist Assistant [phone]

## 2022-11-27 ENCOUNTER — Other Ambulatory Visit: Payer: Self-pay | Admitting: Gastroenterology

## 2022-11-27 DIAGNOSIS — K219 Gastro-esophageal reflux disease without esophagitis: Secondary | ICD-10-CM

## 2022-11-28 ENCOUNTER — Ambulatory Visit (INDEPENDENT_AMBULATORY_CARE_PROVIDER_SITE_OTHER): Payer: Medicare HMO | Admitting: *Deleted

## 2022-11-28 ENCOUNTER — Other Ambulatory Visit: Payer: Self-pay

## 2022-11-28 ENCOUNTER — Telehealth: Payer: Self-pay | Admitting: Family Medicine

## 2022-11-28 VITALS — BP 119/71 | HR 74 | Temp 97.7°F | Resp 16 | Ht 67.0 in | Wt 194.2 lb

## 2022-11-28 DIAGNOSIS — D500 Iron deficiency anemia secondary to blood loss (chronic): Secondary | ICD-10-CM

## 2022-11-28 DIAGNOSIS — D508 Other iron deficiency anemias: Secondary | ICD-10-CM

## 2022-11-28 DIAGNOSIS — K922 Gastrointestinal hemorrhage, unspecified: Secondary | ICD-10-CM

## 2022-11-28 MED ORDER — SODIUM CHLORIDE 0.9 % IV SOLN
200.0000 mg | Freq: Once | INTRAVENOUS | Status: AC
  Administered 2022-11-28: 200 mg via INTRAVENOUS
  Filled 2022-11-28: qty 10

## 2022-11-28 MED ORDER — DIPHENHYDRAMINE HCL 25 MG PO CAPS
25.0000 mg | ORAL_CAPSULE | Freq: Once | ORAL | Status: AC
  Administered 2022-11-28: 25 mg via ORAL
  Filled 2022-11-28: qty 1

## 2022-11-28 MED ORDER — ACETAMINOPHEN 325 MG PO TABS
650.0000 mg | ORAL_TABLET | Freq: Once | ORAL | Status: AC
  Administered 2022-11-28: 650 mg via ORAL
  Filled 2022-11-28: qty 2

## 2022-11-28 MED ORDER — METFORMIN HCL ER 500 MG PO TB24: 2000.0000 mg | ORAL_TABLET | Freq: Every day | ORAL | 0 refills | Status: DC

## 2022-11-28 NOTE — Telephone Encounter (Signed)
Contacted Jerome Vaughn to schedule their annual wellness visit. Appointment made for 12/12/22. Jerome Vaughn AWV direct phone # [phone]   Patient called need to r/s his [id number] awv appt conflict with work  r/s to 12/12/22

## 2022-11-28 NOTE — Progress Notes (Signed)
Diagnosis: Iron Deficiency Anemia  Provider:  Marshell Garfinkel MD  Procedure: Infusion  IV Type: Peripheral, IV Location: R Hand  Venofer (Iron Sucrose), Dose: 200 mg  Infusion Start Time: [id number]  Infusion Stop Time: [id number]  Post Infusion IV Care: Patient declined observation and Peripheral IV Discontinued  Discharge: Condition: Good, Destination: Home . AVS Declined  Performed by:  Baxter Hire, RN

## 2022-12-05 ENCOUNTER — Ambulatory Visit (INDEPENDENT_AMBULATORY_CARE_PROVIDER_SITE_OTHER): Payer: Medicare HMO | Admitting: *Deleted

## 2022-12-05 VITALS — BP 138/81 | HR 81 | Temp 97.4°F | Resp 20 | Ht 67.0 in | Wt 191.0 lb

## 2022-12-05 DIAGNOSIS — D509 Iron deficiency anemia, unspecified: Secondary | ICD-10-CM

## 2022-12-05 DIAGNOSIS — D508 Other iron deficiency anemias: Secondary | ICD-10-CM

## 2022-12-05 MED ORDER — SODIUM CHLORIDE 0.9 % IV SOLN
200.0000 mg | Freq: Once | INTRAVENOUS | Status: AC
  Administered 2022-12-05: 200 mg via INTRAVENOUS
  Filled 2022-12-05: qty 10

## 2022-12-05 MED ORDER — DIPHENHYDRAMINE HCL 25 MG PO CAPS
25.0000 mg | ORAL_CAPSULE | Freq: Once | ORAL | Status: AC
  Administered 2022-12-05: 25 mg via ORAL
  Filled 2022-12-05: qty 1

## 2022-12-05 MED ORDER — ACETAMINOPHEN 325 MG PO TABS
650.0000 mg | ORAL_TABLET | Freq: Once | ORAL | Status: AC
  Administered 2022-12-05: 650 mg via ORAL
  Filled 2022-12-05: qty 2

## 2022-12-05 NOTE — Progress Notes (Signed)
Diagnosis: Iron Deficiency Anemia  Provider:  Marshell Garfinkel MD  Procedure: Infusion  IV Type: Peripheral, IV Location: L Forearm  Venofer (Iron Sucrose), Dose: 200 mg  Infusion Start Time: [id number] pm  Infusion Stop Time: 1550 pm  Post Infusion IV Care: Observation period completed and Peripheral IV Discontinued  Discharge: Condition: Good, Destination: Home . AVS Provided  Performed by:  Oren Beckmann, RN

## 2022-12-12 ENCOUNTER — Ambulatory Visit (INDEPENDENT_AMBULATORY_CARE_PROVIDER_SITE_OTHER): Payer: Medicare HMO

## 2022-12-12 ENCOUNTER — Encounter: Payer: Self-pay | Admitting: Oncology

## 2022-12-12 ENCOUNTER — Encounter: Payer: Self-pay | Admitting: Hematology and Oncology

## 2022-12-12 VITALS — BP 142/76 | HR 67 | Temp 98.2°F | Resp 16 | Ht 67.0 in | Wt 192.0 lb

## 2022-12-12 VITALS — Ht 67.0 in | Wt 192.0 lb

## 2022-12-12 DIAGNOSIS — Z0000 Encounter for general adult medical examination without abnormal findings: Secondary | ICD-10-CM

## 2022-12-12 DIAGNOSIS — D508 Other iron deficiency anemias: Secondary | ICD-10-CM

## 2022-12-12 DIAGNOSIS — D509 Iron deficiency anemia, unspecified: Secondary | ICD-10-CM

## 2022-12-12 MED ORDER — ACETAMINOPHEN 325 MG PO TABS: 650.0000 mg | ORAL_TABLET | Freq: Once | ORAL | Status: DC

## 2022-12-12 MED ORDER — SODIUM CHLORIDE 0.9 % IV SOLN
200.0000 mg | Freq: Once | INTRAVENOUS | Status: AC
  Administered 2022-12-12: 200 mg via INTRAVENOUS
  Filled 2022-12-12: qty 10

## 2022-12-12 MED ORDER — DIPHENHYDRAMINE HCL 25 MG PO CAPS: 25.0000 mg | ORAL_CAPSULE | Freq: Once | ORAL | Status: DC

## 2022-12-12 NOTE — Progress Notes (Signed)
Diagnosis: Iron Deficiency Anemia  Provider:  Marshell Garfinkel MD  Procedure: Infusion  IV Type: Peripheral, IV Location: L Antecubital  Venofer (Iron Sucrose), Dose: 200 mg  Infusion Start Time: [id number]  Infusion Stop Time: [id number]  Post Infusion IV Care: Patient declined observation and Peripheral IV Discontinued  Discharge: Condition: Good, Destination: Home . AVS Provided  Performed by:  Binnie Kand, RN

## 2022-12-12 NOTE — Progress Notes (Signed)
Subjective:   Jerome Vaughn is a 72 y.o. male who presents for Medicare Annual/Subsequent preventive examination.  Review of Systems    Virtual Visit via Telephone Note  I connected with  Jerome Vaughn on 12/12/22 at 11:15 AM EDT by telephone and verified that I am speaking with the correct person using two identifiers.  Location: Patient: Home Provider: Office Persons participating in the virtual visit: patient/Nurse Health Advisor   I discussed the limitations, risks, security and privacy concerns of performing an evaluation and management service by telephone and the availability of in person appointments. The patient expressed understanding and agreed to proceed.  Interactive audio and video telecommunications were attempted between this nurse and patient, however failed, due to patient having technical difficulties OR patient did not have access to video capability.  We continued and completed visit with audio only.  Some vital signs may be absent or patient reported.   Criselda Peaches, LPN  Cardiac Risk Factors include: advanced age (>46men, >28 women);male gender;diabetes mellitus;hypertension     Objective:    Today's Vitals   12/12/22 1128  Weight: 192 lb (87.1 kg)  Height: 5\' 7"  (1.702 m)   Body mass index is 30.07 kg/m.     12/12/2022   11:46 AM 11/29/2021   10:01 AM 11/09/2020   10:45 AM 12/13/2018   10:46 AM 12/12/2018    7:41 PM 10/27/2017    8:39 AM 12/06/2015    1:29 AM  Advanced Directives  Does Patient Have a Medical Advance Directive? No No No No No No No  Would patient like information on creating a medical advance directive? No - Patient declined No - Patient declined No - Patient declined No - Patient declined  No - Patient declined No - patient declined information    Current Medications (verified) Outpatient Encounter Medications as of 12/12/2022  Medication Sig   Accu-Chek Softclix Lancets lancets Use to monitor glucose levels 1X daily; E11.8    acetaminophen (TYLENOL) 500 MG tablet Take 500 mg by mouth as needed.   APPLE CIDER VINEGAR PO Take by mouth daily.    Ascorbic Acid (VITAMIN C PO) Take 1 tablet by mouth daily.   aspirin EC 81 MG tablet Take 1 tablet (81 mg total) by mouth daily.   benazepril (LOTENSIN) 20 MG tablet TAKE 1 TABLET EVERY Lara   Blood Glucose Monitoring Suppl (ACCU-CHEK AVIVA PLUS) w/Device KIT 1 each by Does not apply route daily. Use to monitor glucose levels daily; E11.8   bromocriptine (PARLODEL) 2.5 MG tablet Take 1 tablet (2.5 mg total) by mouth daily.   carvedilol (COREG) 3.125 MG tablet Take 1 tablet (3.125 mg total) by mouth 2 (two) times daily with a meal.   Cinnamon 500 MG capsule Take 1 capsule by mouth daily.   Coenzyme Q10 (COQ-10) 100 MG CAPS Take 1 tablet by mouth daily.   Cyanocobalamin (VITAMIN B-12 PO) Take 1 tablet by mouth daily.   empagliflozin (JARDIANCE) 10 MG TABS tablet Take 1 tablet (10 mg total) by mouth daily before breakfast.   ezetimibe (ZETIA) 10 MG tablet Take 1 tablet (10 mg total) by mouth daily.   ferrous sulfate 325 (65 FE) MG tablet Take 325 mg by mouth daily with breakfast.   Garlic [id number] MG CAPS Take 1 tablet by mouth daily.   glucose blood (ACCU-CHEK GUIDE) test strip Use to monitor glucose levels 1X daily; E11.8   meloxicam (MOBIC) 15 MG tablet Take 15 mg by mouth daily.  metFORMIN (GLUCOPHAGE-XR) 500 MG 24 hr tablet Take 4 tablets (2,000 mg total) by mouth daily.   Multiple Vitamin (MULTIVITAMIN) tablet Take 1 tablet by mouth daily.   nitroGLYCERIN (NITROSTAT) 0.4 MG SL tablet Place 1 tablet (0.4 mg total) under the tongue every 5 (five) minutes as needed for chest pain.   Omega-3 Fatty Acids (FISH OIL PO) Take 1 capsule by mouth daily.   pantoprazole (PROTONIX) 40 MG tablet Take 1 tablet (40 mg total) by mouth 2 (two) times daily. Patient needs office visit.   ranolazine (RANEXA) 500 MG 12 hr tablet Take 1 tablet (500 mg total) by mouth 2 (two) times daily.    repaglinide (PRANDIN) 2 MG tablet Take 1 tablet (2 mg total) by mouth 2 (two) times daily before a meal.   rosuvastatin (CRESTOR) 40 MG tablet Take 1 tablet (40 mg total) by mouth daily.   sildenafil (VIAGRA) 100 MG tablet Take 1 tablet (100 mg total) by mouth as needed for erectile dysfunction.   tadalafil (CIALIS) 20 MG tablet Take 1 tablet (20 mg total) by mouth as needed for erectile dysfunction.   VITAMIN E PO Take 1 capsule by mouth daily.   Facility-Administered Encounter Medications as of 12/12/2022  Medication   acetaminophen (TYLENOL) tablet 650 mg   diphenhydrAMINE (BENADRYL) capsule 25 mg   iron sucrose (VENOFER) 200 mg in sodium chloride 0.9 % 100 mL IVPB    Allergies (verified) Patient has no known allergies.   History: Past Medical History:  Diagnosis Date   Carpal tunnel syndrome    both hands   Colon polyps    Coronary artery disease    sees Dr. Sallyanne Kuster   Diabetes mellitus    sees Dr. Vivia Ewing   GERD (gastroesophageal reflux disease)    History of echocardiogram    a. Echo 4/17: Moderate LVH, EF 50-55%, mild LAE, no pericardial effusion   Hyperlipidemia    Hypertension    MGUS (monoclonal gammopathy of unknown significance)    sees Dr. Narda Rutherford   Neuropathy    gets foot exams with Dr. Daylene Katayama (podiatry)   Pneumonia    Past Surgical History:  Procedure Laterality Date   CARDIAC CATHETERIZATION N/A 11/21/2015   Procedure: Left Heart Cath and Coronary Angiography;  Surgeon: [name], MD;  Location: [place] [facility];  Service: Cardiovascular;  Laterality: N/A;   COLONOSCOPY  02/23/2020   per Dr. Tarri Glenn, tubular adenomas, repeat in 3 yrs    colonsocopy  12/07/2009   per Dr. [name], benign polyps, repeat in 10 yrs    CORONARY ARTERY BYPASS GRAFT N/A 11/22/2015   Procedure: CORONARY ARTERY BYPASS GRAFTING (CABG) times five using the left internal mammary, right greater saphenous vein EVH, and left thigh greater saphenous vein EVH;  Surgeon:  [name], MD;  Location: [place] [facility];  Service: Open Heart Surgery;  Laterality: N/A;   frozen shoulder release Right    INTRAVASCULAR PRESSURE WIRE/FFR STUDY N/A 12/14/2018   Procedure: INTRAVASCULAR PRESSURE WIRE/FFR STUDY;  Surgeon: Jettie Booze, MD;  Location: [place] [facility];  Service: Cardiovascular;  Laterality: N/A;   LEFT HEART CATH AND CORS/GRAFTS ANGIOGRAPHY N/A 10/27/2017   Procedure: LEFT HEART CATH AND CORS/GRAFTS ANGIOGRAPHY;  Surgeon: [name], MD;  Location: [place] [facility];  Service: Cardiovascular;  Laterality: N/A;   LEFT HEART CATH AND CORS/GRAFTS ANGIOGRAPHY N/A 12/14/2018   Procedure: LEFT HEART CATH AND CORS/GRAFTS ANGIOGRAPHY;  Surgeon: Jettie Booze, MD;  Location: [facility]  INVASIVE [facility];  Service: Cardiovascular;  Laterality: N/A;   TEE WITHOUT CARDIOVERSION N/A 11/22/2015   Procedure: TRANSESOPHAGEAL ECHOCARDIOGRAM (TEE);  Surgeon: [name], MD;  Location: [place];  Service: Open Heart Surgery;  Laterality: N/A;   WRIST GANGLION EXCISION Right    Family History  Problem Relation Age of Onset   Heart failure Father    Anuerysm Brother        AAA   Hyperlipidemia Sister    Diabetes Sister    Hypertension Sister    Kidney failure Sister    Diabetes Maternal Grandmother    Hyperlipidemia Sister    Diabetes Sister    Colon polyps Sister    Kidney disease Sister    Other Brother        COVID 58   Diabetes Son    Heart disease Son    Heart attack Neg Hx    Colon cancer Neg Hx    Stomach cancer Neg Hx    Esophageal cancer Neg Hx    Social History   Socioeconomic History   Marital status: Married    Spouse name: Not on file   Number of children: 5   Years of education: Not on file   Highest education level: 12th grade  Occupational History   Occupation: Counsellor  Tobacco Use   Smoking status: Former    Packs/Correll: 0.25    Years: 20.00    Additional pack years: 0.00    Total pack years: 5.00    Types: Cigarettes    Quit  date: 10/07/2013    Years since quitting: 9.1   Smokeless tobacco: Never   Tobacco comments:    quit cigarettes, might have a cigar 1 X month  Vaping Use   Vaping Use: Never used  Substance and Sexual Activity   Alcohol use: Yes    Alcohol/week: 3.0 - 4.0 standard drinks of alcohol    Types: 3 - 4 Standard drinks or equivalent per week   Drug use: No   Sexual activity: Not Currently    Birth control/protection: None  Other Topics Concern   Not on file  Social History Narrative   Originally from [place], [place]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)   Married   5 kids   6 grand, 2 great grand   Social Determinants of Health   Financial Resource Strain: Low Risk  (12/12/2022)   Overall Financial Resource Strain (CARDIA)    Difficulty of Paying Living Expenses: Not hard at all  Food Insecurity: No Food Insecurity (12/12/2022)   Hunger Vital Sign    Worried About Running Out of Food in the Last Year: Never true    [place] in the Last Year: Never true  Transportation Needs: No Transportation Needs (12/12/2022)   PRAPARE - Hydrologist (Medical): No    Lack of Transportation (Non-Medical): No  Physical Activity: Sufficiently Active (12/12/2022)   Exercise Vital Sign    Days of Exercise per Week: 5 days    Minutes of Exercise per Session: 60 min  Stress: No Stress Concern Present (12/12/2022)   [place]    Feeling of Stress : Not at all  Social Connections: [place] (12/12/2022)   Social Connection and Isolation Panel [NHANES]    Frequency of Communication with Friends and Family: More than three times a week    Frequency of Social Gatherings with Friends  and Family: More than three times a week    Attends Religious Services: More than 4 times per year    Active Member of Clubs or Organizations: Yes    Attends Music therapist:  More than 4 times per year    Marital Status: Married    Tobacco Counseling Counseling given: Yes Tobacco comments: quit cigarettes, might have a cigar 1 X month   Clinical Intake:  Pre-visit preparation completed: Yes  Pain : No/denies pain    Nutritional Risks: None Diabetes: Yes CBG done?: Yes (CBG 87 Taken by patient) CBG resulted in Enter/ Edit results?: Yes Did pt. bring in CBG monitor from home?: No  How often do you need to have someone help you when you read instructions, pamphlets, or other written materials from your doctor or pharmacy?: 1 - Never  Diabetic?.Yes  Interpreter Needed?: No Nutrition Risk Assessment:  Has the patient had any N/V/D within the last 2 months?  No  Does the patient have any non-healing wounds?  No  Has the patient had any unintentional weight loss or weight gain?  No   Diabetes:  Is the patient diabetic?  Yes  If diabetic, was a CBG obtained today?  Yes CBG 87 Taken by patient Did the patient bring in their glucometer from home?  No  How often do you monitor your CBG's? 2 X daily.   Financial Strains and Diabetes Management:  Are you having any financial strains with the device, your supplies or your medication? No .  Does the patient want to be seen by Chronic Care Management for management of their diabetes?  No  Would the patient like to be referred to a Nutritionist or for Diabetic Management?  No   Diabetic Exams:  Diabetic Eye Exam: Completed Yes. Overdue for diabetic eye exam. Pt has been advised about the importance in completing this exam. A referral has been placed today. Message sent to referral coordinator for scheduling purposes. Advised pt to expect a call from office referred to regarding appt.  Diabetic Foot Exam: Completed Yes. Pt has been advised about the importance in completing this exam. Pt is scheduled for diabetic foot exam on Followed by . Dr Kelton Pillar Information entered by :: Rolene Arbour  LPN   Activities of Daily Living    12/12/2022   11:42 AM 12/11/2022   10:35 PM  In your present state of health, do you have any difficulty performing the following activities:  Hearing? 0 0  Vision? 0 0  Difficulty concentrating or making decisions? 0 0  Walking or climbing stairs? 0 0  Dressing or bathing? 1 1  Comment Wife assiston occasions.   Doing errands, shopping? 0 0  Preparing Food and eating ? N N  Using the Toilet? N N  In the past six months, have you accidently leaked urine? N N  Do you have problems with loss of bowel control? N N  Managing your Medications? N N  Managing your Finances? N N  Housekeeping or managing your Housekeeping? N N    Patient Care Team: [name], MD as PCP - General (Family Medicine) [name], MD (Inactive) as PCP - Cardiology (Cardiology) Thornton Park, MD as Consulting Physician (Gastroenterology) [name], MD (Inactive) as Consulting Physician (Endocrinology) Orson Slick, MD as Consulting Physician (Hematology and Oncology) Viona Gilmore, [facility] (Inactive) as Pharmacist (Pharmacist)  Indicate any recent Medical Services you may have received from other than [facility] providers in the  past year (date may be approximate).     Assessment:   This is a routine wellness examination for [facility].  Hearing/Vision screen Hearing Screening - Comments:: Denies hearing difficulties   Vision Screening - Comments:: Wears rx glasses - up to date with routine eye exams with  [place] issues and exercise activities discussed: Current Exercise Habits: Home exercise routine, Type of exercise: walking, Time (Minutes): 60, Frequency (Times/Week): 5, Weekly Exercise (Minutes/Week): 300, Intensity: Moderate, Exercise limited by: None identified   Goals Addressed               This Visit's Progress     Lose weight (pt-stated)        I want to lose another 10 lbs       Depression Screen    12/12/2022   11:42  AM 10/10/2022   10:07 AM 04/18/2022   11:22 AM 11/29/2021    9:55 AM 08/30/2021    2:52 PM 11/09/2020   10:47 AM 08/02/2020   10:56 AM  PHQ 2/9 Scores  PHQ - 2 Score 0 0 0 0 0 0 0  PHQ- 9 Score   0  0  0    Fall Risk    12/12/2022   11:45 AM 12/11/2022   10:35 PM 10/10/2022   10:07 AM 04/18/2022   11:22 AM 04/17/2022    8:54 PM  Fall Risk   Falls in the past year? 1 1 0 0 0  Number falls in past yr: 0 0 0 0   Injury with Fall? 0 0 0 0   Comment No medicsl attention required      Risk for fall due to : No Fall Risks  No Fall Risks No Fall Risks   Follow up Falls prevention discussed  Falls evaluation completed Falls evaluation completed     [place]:  Any stairs in or around the home? Yes  If so, are there any without handrails? No  Home free of loose throw rugs in walkways, pet beds, electrical cords, etc? Yes  Adequate lighting in your home to reduce risk of falls? Yes   ASSISTIVE DEVICES UTILIZED TO PREVENT FALLS:  Life alert? No  Use of a cane, walker or w/c? No  Grab bars in the bathroom? Yes  Shower chair or bench in shower? Yes  Elevated toilet seat or a handicapped toilet? No   TIMED UP AND GO:  Was the test performed? No . Audio visit   Cognitive Function:        12/12/2022   11:46 AM 11/29/2021   10:01 AM  6CIT Screen  What Year? 0 points 0 points  What month? 0 points 0 points  What time? 0 points 0 points  Count back from 20 0 points 0 points  Months in reverse 0 points 0 points  Repeat phrase 0 points 0 points  Total Score 0 points 0 points    Immunizations Immunization History  Administered Date(s) Administered   Fluad Quad(high Dose 65+) 08/02/2020, 08/30/2021, 08/07/2022   Influenza, High Dose Seasonal PF 05/30/2019   Influenza,inj,Quad PF,6+ Mos 08/22/2014, 12/03/2016, 11/23/2017   Influenza-Unspecified 08/29/2013, 07/30/2015, 08/02/2016, 10/04/2018, 10/07/2018, 05/12/2019   PFIZER(Purple Top)SARS-COV-2  Vaccination 11/11/2019, 12/05/2019, 06/28/2020, 02/05/2021, 01/31/2022   Pfizer Covid-19 Vaccine Bivalent Booster 42yrs & up 06/21/2021, 08/07/2022   Pneumococcal Conjugate-13 08/27/2015   Pneumococcal Polysaccharide-23 02/09/2017   Respiratory Syncytial Virus Vaccine,Recomb Aduvanted(Arexvy) 08/07/2022   Td (Adult),unspecified 11/03/2021   Tdap  03/28/2011, 11/03/2021   Zoster Recombinat (Shingrix) 11/26/2018, 05/31/2019    TDAP status: Up to date  Flu Vaccine status: Up to date  Pneumococcal vaccine status: Up to date  Covid-19 vaccine status: Completed vaccines  Qualifies for Shingles Vaccine? Yes   Zostavax completed Yes   Shingrix Completed?: Yes  Screening Tests Health Maintenance  Topic Date Due   FOOT EXAM  12/13/2022 (Originally 06/14/2022)   COVID-19 Vaccine (8 - 2023-24 season) 12/28/2022 (Originally 10/02/2022)   Pneumonia Vaccine 15+ Years old (67 of 3 - PPSV23 or PCV20) 12/12/2023 (Originally 02/09/2022)   COLONOSCOPY (Pts 45-48yrs Insurance coverage will need to be confirmed)  02/23/2023   HEMOGLOBIN A1C  04/10/2023   Diabetic kidney evaluation - Urine ACR  04/26/2023   OPHTHALMOLOGY EXAM  07/30/2023   Diabetic kidney evaluation - eGFR measurement  10/18/2023   Medicare Annual Wellness (AWV)  12/12/2023   DTaP/Tdap/Td (4 - Td or Tdap) 11/04/2031   INFLUENZA VACCINE  Completed   Hepatitis C Screening  Completed   Zoster Vaccines- Shingrix  Completed   HPV VACCINES  Aged Out    Health Maintenance  There are no preventive care reminders to display for this patient.   Colorectal cancer screening: Type of screening: Colonoscopy. Completed 02/03/20. Repeat every 3 years  Lung Cancer Screening: (Low Dose CT Chest recommended if Age 13-80 years, 30 pack-year currently smoking OR have quit w/in 15years.) does not qualify.     Additional Screening:  Hepatitis C Screening: does qualify; Completed 02/22/20  Vision Screening: Recommended annual ophthalmology exams for  early detection of glaucoma and other disorders of the eye. Is the patient up to date with their annual eye exam?  Yes  Who is the provider or what is the name of the office in which the patient attends annual eye exams? [facility] If pt is not established with a provider, would they like to be referred to a provider to establish care? No .   Dental Screening: Recommended annual dental exams for proper oral hygiene  Community Resource Referral / Chronic Care Management:  CRR required this visit?  No   CCM required this visit?  No      Plan:     I have personally reviewed and noted the following in the patient's chart:   Medical and social history Use of alcohol, tobacco or illicit drugs  Current medications and supplements including opioid prescriptions. Patient is not currently taking opioid prescriptions. Functional ability and status Nutritional status Physical activity Advanced directives List of other physicians Hospitalizations, surgeries, and ER visits in previous 12 months Vitals Screenings to include cognitive, depression, and falls Referrals and appointments  In addition, I have reviewed and discussed with patient certain preventive protocols, quality metrics, and best practice recommendations. A written personalized care plan for preventive services as well as general preventive health recommendations were provided to patient.     Criselda Peaches, LPN   [id number]   Nurse Notes: None

## 2022-12-12 NOTE — Patient Instructions (Addendum)
Jerome Vaughn , Thank you for taking time to come for your Medicare Wellness Visit. I appreciate your ongoing commitment to your health goals. Please review the following plan we discussed and let me know if I can assist you in the future.   These are the goals we discussed:  Goals       Lose weight (pt-stated)      I want to lose another 10 lbs      Weight (lb) < 180 lb (81.6 kg) (pt-stated)      Maintain good health, increase exercise by riding my bike and modify diet.        This is a list of the screening recommended for you and due dates:  Health Maintenance  Topic Date Due   Complete foot exam   12/13/2022*   COVID-19 Vaccine (8 - 2023-24 season) 12/28/2022*   Pneumonia Vaccine (3 of 3 - PPSV23 or PCV20) 12/12/2023*   Colon Cancer Screening  02/23/2023   Hemoglobin A1C  04/10/2023   Yearly kidney health urinalysis for diabetes  04/26/2023   Eye exam for diabetics  07/30/2023   Yearly kidney function blood test for diabetes  10/18/2023   Medicare Annual Wellness Visit  12/12/2023   DTaP/Tdap/Td vaccine (4 - Td or Tdap) 11/04/2031   Flu Shot  Completed   Hepatitis C Screening: USPSTF Recommendation to screen - Ages 18-79 yo.  Completed   Zoster (Shingles) Vaccine  Completed   HPV Vaccine  Aged Out  *Topic was postponed. The date shown is not the original due date.    Advanced directives: Advance directive discussed with you today. Even though you declined this today, please call our office should you change your mind, and we can give you the proper paperwork for you to fill out.   Conditions/risks identified: None  Next appointment: Follow up in one year for your annual wellness visit.   Preventive Care 26 Years and Older, Male  Preventive care refers to lifestyle choices and visits with your health care provider that can promote health and wellness. What does preventive care include? A yearly physical exam. This is also called an annual well check. Dental exams once or  twice a year. Routine eye exams. Ask your health care provider how often you should have your eyes checked. Personal lifestyle choices, including: Daily care of your teeth and gums. Regular physical activity. Eating a healthy diet. Avoiding tobacco and drug use. Limiting alcohol use. Practicing safe sex. Taking low doses of aspirin every [place]. Taking vitamin and mineral supplements as recommended by your health care provider. What happens during an annual well check? The services and screenings done by your health care provider during your annual well check will depend on your age, overall health, lifestyle risk factors, and family history of disease. Counseling  Your health care provider may ask you questions about your: Alcohol use. Tobacco use. Drug use. Emotional well-being. Home and relationship well-being. Sexual activity. Eating habits. History of falls. Memory and ability to understand (cognition). Work and work Statistician. Screening  You may have the following tests or measurements: Height, weight, and BMI. Blood pressure. Lipid and cholesterol levels. These may be checked every 5 years, or more frequently if you are over 54 years old. Skin check. Lung cancer screening. You may have this screening every year starting at age 19 if you have a 30-pack-year history of smoking and currently smoke or have quit within the past 15 years. Fecal occult blood test (FOBT) of the  stool. You may have this test every year starting at age 67. Flexible sigmoidoscopy or colonoscopy. You may have a sigmoidoscopy every 5 years or a colonoscopy every 10 years starting at age 64. Prostate cancer screening. Recommendations will vary depending on your family history and other risks. Hepatitis C blood test. Hepatitis B blood test. Sexually transmitted disease (STD) testing. Diabetes screening. This is done by checking your blood sugar (glucose) after you have not eaten for a while (fasting).  You may have this done every 1-3 years. Abdominal aortic aneurysm (AAA) screening. You may need this if you are a current or former smoker. Osteoporosis. You may be screened starting at age 31 if you are at high risk. Talk with your health care provider about your test results, treatment options, and if necessary, the need for more tests. Vaccines  Your health care provider may recommend certain vaccines, such as: Influenza vaccine. This is recommended every year. Tetanus, diphtheria, and acellular pertussis (Tdap, Td) vaccine. You may need a Td booster every 10 years. Zoster vaccine. You may need this after age 55. Pneumococcal 13-valent conjugate (PCV13) vaccine. One dose is recommended after age 84. Pneumococcal polysaccharide (PPSV23) vaccine. One dose is recommended after age 51. Talk to your health care provider about which screenings and vaccines you need and how often you need them. This information is not intended to replace advice given to you by your health care provider. Make sure you discuss any questions you have with your health care provider. Document Released: 10/12/2015 Document Revised: 06/04/2016 Document Reviewed: 07/17/2015 Elsevier Interactive Patient Education  2017 [place] Prevention in the Home Falls can cause injuries. They can happen to people of all ages. There are many things you can do to make your home safe and to help prevent falls. What can I do on the outside of my home? Regularly fix the edges of walkways and driveways and fix any cracks. Remove anything that might make you trip as you walk through a door, such as a raised step or threshold. Trim any bushes or trees on the path to your home. Use bright outdoor lighting. Clear any walking paths of anything that might make someone trip, such as rocks or tools. Regularly check to see if handrails are loose or broken. Make sure that both sides of any steps have handrails. Any raised decks and  porches should have guardrails on the edges. Have any leaves, snow, or ice cleared regularly. Use sand or salt on walking paths during winter. Clean up any spills in your garage right away. This includes oil or grease spills. What can I do in the bathroom? Use night lights. Install grab bars by the toilet and in the tub and shower. Do not use towel bars as grab bars. Use non-skid mats or decals in the tub or shower. If you need to sit down in the shower, use a plastic, non-slip stool. Keep the floor dry. Clean up any water that spills on the floor as soon as it happens. Remove soap buildup in the tub or shower regularly. Attach bath mats securely with double-sided non-slip rug tape. Do not have throw rugs and other things on the floor that can make you trip. What can I do in the bedroom? Use night lights. Make sure that you have a light by your bed that is easy to reach. Do not use any sheets or blankets that are too big for your bed. They should not hang down onto the floor.  Have a firm chair that has side arms. You can use this for support while you get dressed. Do not have throw rugs and other things on the floor that can make you trip. What can I do in the kitchen? Clean up any spills right away. Avoid walking on wet floors. Keep items that you use a lot in easy-to-reach places. If you need to reach something above you, use a strong step stool that has a grab bar. Keep electrical cords out of the way. Do not use floor polish or wax that makes floors slippery. If you must use wax, use non-skid floor wax. Do not have throw rugs and other things on the floor that can make you trip. What can I do with my stairs? Do not leave any items on the stairs. Make sure that there are handrails on both sides of the stairs and use them. Fix handrails that are broken or loose. Make sure that handrails are as long as the stairways. Check any carpeting to make sure that it is firmly attached to the  stairs. Fix any carpet that is loose or worn. Avoid having throw rugs at the top or bottom of the stairs. If you do have throw rugs, attach them to the floor with carpet tape. Make sure that you have a light switch at the top of the stairs and the bottom of the stairs. If you do not have them, ask someone to add them for you. What else can I do to help prevent falls? Wear shoes that: Do not have high heels. Have rubber bottoms. Are comfortable and fit you well. Are closed at the toe. Do not wear sandals. If you use a stepladder: Make sure that it is fully opened. Do not climb a closed stepladder. Make sure that both sides of the stepladder are locked into place. Ask someone to hold it for you, if possible. Clearly mark and make sure that you can see: Any grab bars or handrails. First and last steps. Where the edge of each step is. Use tools that help you move around (mobility aids) if they are needed. These include: Canes. Walkers. Scooters. Crutches. Turn on the lights when you go into a dark area. Replace any light bulbs as soon as they burn out. Set up your furniture so you have a clear path. Avoid moving your furniture around. If any of your floors are uneven, fix them. If there are any pets around you, be aware of where they are. Review your medicines with your doctor. Some medicines can make you feel dizzy. This can increase your chance of falling. Ask your doctor what other things that you can do to help prevent falls. This information is not intended to replace advice given to you by your health care provider. Make sure you discuss any questions you have with your health care provider. Document Released: 07/12/2009 Document Revised: 02/21/2016 Document Reviewed: 10/20/2014 Elsevier Interactive Patient Education  2017 [employer].

## 2022-12-15 ENCOUNTER — Telehealth: Payer: Self-pay | Admitting: *Deleted

## 2022-12-15 NOTE — Telephone Encounter (Signed)
Patient is calling to cancel his upcoming appointment,please reschedule.

## 2022-12-19 ENCOUNTER — Ambulatory Visit: Payer: Medicare HMO | Admitting: Podiatry

## 2022-12-24 ENCOUNTER — Other Ambulatory Visit: Payer: Self-pay | Admitting: Family Medicine

## 2022-12-24 ENCOUNTER — Other Ambulatory Visit: Payer: Self-pay

## 2022-12-24 MED ORDER — EZETIMIBE 10 MG PO TABS: 10.0000 mg | ORAL_TABLET | Freq: Every day | ORAL | 2 refills | Status: DC

## 2022-12-24 MED ORDER — BENAZEPRIL HCL 20 MG PO TABS: 20.0000 mg | ORAL_TABLET | Freq: Every day | ORAL | 3 refills | Status: DC

## 2022-12-27 ENCOUNTER — Encounter (HOSPITAL_COMMUNITY): Payer: Self-pay | Admitting: Emergency Medicine

## 2022-12-27 ENCOUNTER — Ambulatory Visit (HOSPITAL_COMMUNITY)
Admission: EM | Admit: 2022-12-27 | Discharge: 2022-12-27 | Disposition: A | Discharge status: Home / Self Care | Payer: Medicare HMO | Attending: Internal Medicine | Admitting: Internal Medicine

## 2022-12-27 DIAGNOSIS — R040 Epistaxis: Secondary | ICD-10-CM

## 2022-12-27 MED ORDER — FLUTICASONE PROPIONATE 50 MCG/ACT NA SUSP: 2.0000 | Freq: Every day | NASAL | 0 refills | Status: DC

## 2022-12-27 MED ORDER — OXYMETAZOLINE HCL 0.05 % NA SOLN
NASAL | Status: AC
  Filled 2022-12-27: qty 30

## 2022-12-27 MED ORDER — OXYMETAZOLINE HCL 0.05 % NA SOLN
1.0000 | Freq: Once | NASAL | Status: AC
  Administered 2022-12-27: 1 via NASAL

## 2022-12-27 NOTE — ED Provider Notes (Signed)
[place]    CSN: [id number] Arrival date & time: 12/27/22  1014      History   Chief Complaint Chief Complaint  Patient presents with   Epistaxis    HPI Jerome Vaughn is a 72 y.o. male.   Patient presents to urgent care for evaluation of left-sided epistaxis that started approximately 8 AM this morning (4 hours ago).  He states he had nosebleeds as a child but has not suffered from this as an adult.  No recent trauma/injury to the face.  Denies vision changes, dizziness, recent rhinorrhea/cough/viral URI symptoms, fever/chills, headache, and sore throat.  States he may have blown his nose too hard this morning.  Denies recent nasal congestion and sinus pain.  No recent antibiotic or steroid use.  Denies use of blood thinners.  He does take aspirin 81 mg in the morning for cardiac prevention.  Denies recent excessive intake of ibuprofen.  Epistaxis initially started to the left nostril this morning, then he experienced some bleeding from the right nostril less significant than the left.  He currently has 2 cotton balls placed to both nostrils and states that the bleeding has stopped at this time.   Epistaxis   Past Medical History:  Diagnosis Date   Carpal tunnel syndrome    both hands   Colon polyps    Coronary artery disease    sees Dr. Sallyanne Kuster   Diabetes mellitus    sees Dr. Vivia Ewing   GERD (gastroesophageal reflux disease)    History of echocardiogram    a. Echo 4/17: Moderate LVH, EF 50-55%, mild LAE, no pericardial effusion   Hyperlipidemia    Hypertension    MGUS (monoclonal gammopathy of unknown significance)    sees Dr. Narda Rutherford   Neuropathy    gets foot exams with Dr. Daylene Katayama (podiatry)   Pneumonia     Patient Active Problem List   Diagnosis Date Noted   OA (osteoarthritis) of knee 04/18/2022   Left shoulder pain 04/18/2022   GERD (gastroesophageal reflux disease)    Bacterial infection due to H. pylori 03/01/2020   Monoclonal  gammopathy of unknown significance (MGUS) 03/01/2020   Iron deficiency anemia 02/22/2020   Onychomycosis 02/08/2020   Rectal bleeding 01/24/2020   Indigestion 01/24/2020   Angina pectoris ([place]) 12/12/2018   Erectile disorder due to medical condition in male 04/08/2018   Chronic combined systolic (congestive) and diastolic (congestive) heart failure ([place]) 07/29/2017   Pericardial effusion 12/24/2015   Type 2 diabetes mellitus with complication, without long-term current use of insulin ([place]) 12/10/2015   S/P CABG x 5 11/22/2015   Abnormal stress test 11/21/2015   Coronary artery disease of bypass graft of native heart with stable angina pectoris (HCC)    Nonspecific abnormal electrocardiogram (ECG) (EKG) 11/07/2015   Essential hypertension 03/30/2011   Hypercholesterolemia 03/30/2011   Colon polyp 03/30/2011    Past Surgical History:  Procedure Laterality Date   CARDIAC CATHETERIZATION N/A 11/21/2015   Procedure: Left Heart Cath and Coronary Angiography;  Surgeon: [name], MD;  Location: [facility];  Service: Cardiovascular;  Laterality: N/A;   COLONOSCOPY  02/23/2020   per Dr. Tarri Glenn, tubular adenomas, repeat in 3 yrs    colonsocopy  12/07/2009   per Dr. [name], benign polyps, repeat in 10 yrs    CORONARY ARTERY BYPASS GRAFT N/A 11/22/2015   Procedure: CORONARY ARTERY BYPASS GRAFTING (CABG) times five using the left internal mammary, right greater saphenous vein EVH, and  left thigh greater saphenous vein EVH;  Surgeon: [name], MD;  Location: [facility];  Service: Open Heart Surgery;  Laterality: N/A;   frozen shoulder release Right    INTRAVASCULAR PRESSURE WIRE/FFR STUDY N/A 12/14/2018   Procedure: INTRAVASCULAR PRESSURE WIRE/FFR STUDY;  Surgeon: Jettie Booze, MD;  Location: [facility];  Service: Cardiovascular;  Laterality: N/A;   LEFT HEART CATH AND CORS/GRAFTS ANGIOGRAPHY N/A 10/27/2017   Procedure: LEFT HEART CATH AND CORS/GRAFTS ANGIOGRAPHY;   Surgeon: [name], MD;  Location: [facility];  Service: Cardiovascular;  Laterality: N/A;   LEFT HEART CATH AND CORS/GRAFTS ANGIOGRAPHY N/A 12/14/2018   Procedure: LEFT HEART CATH AND CORS/GRAFTS ANGIOGRAPHY;  Surgeon: Jettie Booze, MD;  Location: [facility];  Service: Cardiovascular;  Laterality: N/A;   TEE WITHOUT CARDIOVERSION N/A 11/22/2015   Procedure: TRANSESOPHAGEAL ECHOCARDIOGRAM (TEE);  Surgeon: [name], MD;  Location: [facility];  Service: Open Heart Surgery;  Laterality: N/A;   WRIST GANGLION EXCISION Right        Home Medications    Prior to Admission medications   Medication Sig Start Date End Date Taking? Authorizing Provider  fluticasone (FLONASE) 50 MCG/ACT nasal spray Place 2 sprays into both nostrils daily. 12/27/22  Yes Talbot Grumbling, FNP  Accu-Chek Softclix Lancets lancets Use to monitor glucose levels 1X daily; E11.8 09/19/22   Shamleffer, Melanie Crazier, MD  acetaminophen (TYLENOL) 500 MG tablet Take 500 mg by mouth as needed.    [provider]  APPLE CIDER VINEGAR PO Take by mouth daily.     [provider]  Ascorbic Acid (VITAMIN C PO) Take 1 tablet by mouth daily.    [provider]  aspirin EC 81 MG tablet Take 1 tablet (81 mg total) by mouth daily. 06/24/17   Burtis Junes, NP  benazepril (LOTENSIN) 20 MG tablet Take 1 tablet (20 mg total) by mouth daily. 12/24/22   Croitoru, Mihai, MD  Blood Glucose Monitoring Suppl (ACCU-CHEK AVIVA PLUS) w/Device KIT 1 each by Does not apply route daily. Use to monitor glucose levels daily; E11.8 09/05/22   Shamleffer, Melanie Crazier, MD  bromocriptine (PARLODEL) 2.5 MG tablet Take 1 tablet (2.5 mg total) by mouth daily. 07/07/22   Shamleffer, Melanie Crazier, MD  carvedilol (COREG) 3.125 MG tablet Take 1 tablet (3.125 mg total) by mouth 2 (two) times daily with a meal. 11/14/22   Croitoru, Mihai, MD  Cinnamon 500 MG capsule Take 1 capsule by mouth daily. 01/23/1996    [provider]  Coenzyme Q10 (COQ-10) 100 MG CAPS Take 1 tablet by mouth daily.    [provider]  Cyanocobalamin (VITAMIN B-12 PO) Take 1 tablet by mouth daily.    [provider]  empagliflozin (JARDIANCE) 10 MG TABS tablet Take 1 tablet (10 mg total) by mouth daily before breakfast. 09/19/22   Croitoru, Mihai, MD  ezetimibe (ZETIA) 10 MG tablet Take 1 tablet (10 mg total) by mouth daily. 12/24/22   Croitoru, Mihai, MD  ferrous sulfate 325 (65 FE) MG tablet Take 325 mg by mouth daily with breakfast.    [provider]  Garlic 123XX123 MG CAPS Take 1 tablet by mouth daily.    [provider]  glucose blood (ACCU-CHEK GUIDE) test strip Use to monitor glucose levels 1X daily; E11.8 09/19/22   Shamleffer, Melanie Crazier, MD  meloxicam (MOBIC) 15 MG tablet Take 15 mg by mouth daily.    [provider]  metFORMIN (GLUCOPHAGE-XR) 500 MG 24 hr tablet Take 4 tablets (2,000 mg total) by mouth daily. 11/28/22   Shamleffer, Melanie Crazier, MD  Multiple Vitamin (MULTIVITAMIN) tablet Take 1 tablet by mouth daily.    [provider]  nitroGLYCERIN (NITROSTAT) 0.4 MG SL tablet Place 1 tablet (0.4 mg total) under the tongue every 5 (five) minutes as needed for chest pain. 11/14/22   Croitoru, Mihai, MD  Omega-3 Fatty Acids (FISH OIL PO) Take 1 capsule by mouth daily.    [provider]  pantoprazole (PROTONIX) 40 MG tablet Take 1 tablet (40 mg total) by mouth 2 (two) times daily. Patient needs office visit. 09/15/22   Thornton Park, MD  ranolazine (RANEXA) 500 MG 12 hr tablet Take 1 tablet (500 mg total) by mouth 2 (two) times daily. 05/20/22   [name], MD  repaglinide (PRANDIN) 2 MG tablet Take 1 tablet (2 mg total) by mouth 2 (two) times daily before a meal. 01/24/22   [name], MD  rosuvastatin (CRESTOR) 40 MG tablet TAKE 1 TABLET EVERY [place] 12/24/22   [name], MD  sildenafil (VIAGRA) 100 MG tablet Take 1 tablet (100 mg  total) by mouth as needed for erectile dysfunction. 10/10/22   [name], MD  tadalafil (CIALIS) 20 MG tablet Take 1 tablet (20 mg total) by mouth as needed for erectile dysfunction. 04/18/22   [name], MD  VITAMIN E PO Take 1 capsule by mouth daily.    [provider]    Family History Family History  Problem Relation Age of Onset   Heart failure Father    Anuerysm Brother        AAA   Hyperlipidemia Sister    Diabetes Sister    Hypertension Sister    Kidney failure Sister    Diabetes Maternal Grandmother    Hyperlipidemia Sister    Diabetes Sister    Colon polyps Sister    Kidney disease Sister    Other Brother        COVID 19   Diabetes Son    Heart disease Son    Heart attack Neg Hx    Colon cancer Neg Hx    Stomach cancer Neg Hx    Esophageal cancer Neg Hx     Social History Social History   Tobacco Use   Smoking status: Former    Packs/Ragen: 0.25    Years: 20.00    Additional pack years: 0.00    Total pack years: 5.00    Types: Cigarettes    Quit date: 10/07/2013    Years since quitting: 9.2   Smokeless tobacco: Never   Tobacco comments:    quit cigarettes, might have a cigar 1 X month  Vaping Use   Vaping Use: Never used  Substance Use Topics   Alcohol use: Yes    Alcohol/week: 3.0 - 4.0 standard drinks of alcohol    Types: 3 - 4 Standard drinks or equivalent per week   Drug use: No     Allergies   Patient has no known allergies.   Review of Systems Review of Systems  HENT:  Positive for nosebleeds.   Per HPI   Physical Exam Triage Vital Signs ED Triage Vitals  Enc Vitals Group     BP 12/27/22 1121 135/87     Pulse Rate 12/27/22 1121 74     Resp 12/27/22 1121 15     Temp 12/27/22 1121 98 F (36.7 C)  Temp Source 12/27/22 1121 Oral     SpO2 12/27/22 1121 95 %     Weight --      Height --      Head Circumference --      Peak Flow --      Pain Score 12/27/22 1120 0     Pain Loc --      Pain Edu? --      Excl. in  [place]? --    No data found.  Updated Vital Signs BP 135/87 (BP Location: Left Arm)   Pulse 74   Temp 98 F (36.7 C) (Oral)   Resp 15   SpO2 95%   Visual Acuity Right Eye Distance:   Left Eye Distance:   Bilateral Distance:    Right Eye Near:   Left Eye Near:    Bilateral Near:     Physical Exam Vitals and nursing note reviewed.  Constitutional:      Appearance: He is not ill-appearing or toxic-appearing.  HENT:     Head: Normocephalic and atraumatic.     Right Ear: Hearing, tympanic membrane, ear canal and external ear normal.     Left Ear: Hearing, tympanic membrane, ear canal and external ear normal.     Nose: No nasal deformity, signs of injury, nasal tenderness, mucosal edema, congestion or rhinorrhea.     Right Nostril: No foreign body, epistaxis, septal hematoma or occlusion.     Left Nostril: Epistaxis present. No foreign body, septal hematoma or occlusion.     Right Turbinates: Swollen.     Left Turbinates: Swollen.     Comments: No active bleeding.  Cotton balls present to bilateral turbinates on initial assessment.  Removed cotton ball to the left nostril to reveal significant large blood clot/drainage from the left turbinate.  No further bleeding after clot removed from the left nostril.  Cottonball to the right nostril removed without evidence of blood clot or bleeding.    Mouth/Throat:     Lips: Pink.     Mouth: Mucous membranes are moist. No injury.     Tongue: No lesions. Tongue does not deviate from midline.     Palate: No mass and lesions.     Pharynx: Oropharynx is clear. Uvula midline. No pharyngeal swelling, oropharyngeal exudate, posterior oropharyngeal erythema or uvula swelling.     Tonsils: No tonsillar exudate or tonsillar abscesses.  Eyes:     General: Lids are normal. Vision grossly intact. Gaze aligned appropriately.     Extraocular Movements: Extraocular movements intact.     Conjunctiva/sclera: Conjunctivae normal.  Pulmonary:     Effort:  Pulmonary effort is normal.  Musculoskeletal:     Cervical back: Neck supple.  Lymphadenopathy:     Cervical: No cervical adenopathy.  Skin:    General: Skin is warm and dry.     Capillary Refill: Capillary refill takes less than 2 seconds.     Findings: No rash.  Neurological:     General: No focal deficit present.     Mental Status: He is alert and oriented to person, place, and time. Mental status is at baseline.     Cranial Nerves: No dysarthria or facial asymmetry.  Psychiatric:        Mood and Affect: Mood normal.        Speech: Speech normal.        Behavior: Behavior normal.        Thought Content: Thought content normal.        Judgment:  Judgment normal.      UC Treatments / Results  Labs (all labs ordered are listed, but only abnormal results are displayed) Labs Reviewed - No data to display  EKG   Radiology No results found.  Procedures Procedures (including critical care time)  Medications Ordered in UC Medications  oxymetazoline (AFRIN) 0.05 % nasal spray 1 spray (has no administration in time range)    Initial Impression / Assessment and Plan / UC Course  I have reviewed the triage vital signs and the nursing notes.  Pertinent labs & imaging results that were available during my care of the patient were reviewed by me and considered in my medical decision making (see chart for details).   1.  Epistaxis No active bleeding currently in clinic.  Patient able to blow his nose without bleeding or clots after initial assessment.  Afrin spray to both nostrils given in clinic.  Patient placed on Flonase nasal spray daily.  Given follow-up information for ear nose and throat should symptoms fail to improve.  Advised follow-up with PCP in the meantime.  Strict ER return precautions discussed.  Vitals are hemodynamically stable and he is neurologically intact to baseline.  Discussed physical exam and available lab work findings in clinic with patient.  Counseled  patient regarding appropriate use of medications and potential side effects for all medications recommended or prescribed today. Discussed red flag signs and symptoms of worsening condition,when to call the PCP office, return to urgent care, and when to seek higher level of care in the emergency department. Patient verbalizes understanding and agreement with plan. All questions answered. Patient discharged in stable condition.    Final Clinical Impressions(s) / UC Diagnoses   Final diagnoses:  Epistaxis     Discharge Instructions      Use flonase everyday to help prevent nose bleeds. This is a steroid nasal spray.   Use humidifier in your room at nighttime to help add water to the air and prevent dryness which may contribute to nosebleeding.  I gave you Afrin nasal spray today to stop acute bleeding. If you choose to purchase Afrin over-the-counter, only use this for acute bleeding and do not use this for longer than 3 days as it can cause significant nasal congestion.  Please follow-up with your primary care provider as needed for reevaluation of nosebleeds. I have listed an ear nose and throat doctor on your paperwork for you to call if nosebleeds persist.  If you start having significant nose bleeding that does not stop with multiple clots, I would like for you to go to the emergency room for reevaluation.  Return to urgent care as needed. I hope you feel better!     ED Prescriptions     Medication Sig Dispense Auth. Provider   fluticasone (FLONASE) 50 MCG/ACT nasal spray Place 2 sprays into both nostrils daily. 16 g Talbot Grumbling, FNP      PDMP not reviewed this encounter.   Talbot Grumbling, [place] 12/27/22 1206

## 2022-12-27 NOTE — Discharge Instructions (Addendum)
Use flonase everyday to help prevent nose bleeds. This is a steroid nasal spray.   Use humidifier in your room at nighttime to help add water to the air and prevent dryness which may contribute to nosebleeding.  I gave you Afrin nasal spray today to stop acute bleeding. If you choose to purchase Afrin over-the-counter, only use this for acute bleeding and do not use this for longer than 3 days as it can cause significant nasal congestion.  Please follow-up with your primary care provider as needed for reevaluation of nosebleeds. I have listed an ear nose and throat doctor on your paperwork for you to call if nosebleeds persist.  If you start having significant nose bleeding that does not stop with multiple clots, I would like for you to go to the emergency room for reevaluation.  Return to urgent care as needed. I hope you feel better!

## 2022-12-27 NOTE — ED Triage Notes (Signed)
Pt c/o nose bleeding after blowing nose this morning. Has cotton stuffed in nose currently.

## 2023-01-05 ENCOUNTER — Telehealth: Payer: Self-pay | Admitting: Gastroenterology

## 2023-01-05 DIAGNOSIS — K219 Gastro-esophageal reflux disease without esophagitis: Secondary | ICD-10-CM

## 2023-01-05 MED ORDER — PANTOPRAZOLE SODIUM 40 MG PO TBEC: 40.0000 mg | DELAYED_RELEASE_TABLET | Freq: Two times a day (BID) | ORAL | 0 refills | Status: DC

## 2023-01-05 NOTE — Telephone Encounter (Signed)
Script sent to pharmacy.

## 2023-01-05 NOTE — Telephone Encounter (Signed)
Patient called regarding refill for Protonix. Scheduled for OV for 03/06/2023 for medication refill. Patient was wondering if he could have a refill until his appointment. Please advise.

## 2023-01-09 ENCOUNTER — Encounter: Payer: Self-pay | Admitting: Family Medicine

## 2023-01-09 ENCOUNTER — Ambulatory Visit (INDEPENDENT_AMBULATORY_CARE_PROVIDER_SITE_OTHER): Payer: Medicare HMO | Admitting: Family Medicine

## 2023-01-09 VITALS — BP 130/82 | HR 71 | Temp 98.1°F | Wt 190.4 lb

## 2023-01-09 DIAGNOSIS — D61818 Other pancytopenia: Secondary | ICD-10-CM | POA: Diagnosis not present

## 2023-01-09 DIAGNOSIS — D472 Monoclonal gammopathy: Secondary | ICD-10-CM

## 2023-01-09 DIAGNOSIS — R040 Epistaxis: Secondary | ICD-10-CM | POA: Diagnosis not present

## 2023-01-09 NOTE — Progress Notes (Signed)
   Subjective:    Patient ID: Jerome Vaughn, male    DOB: Jun 22, 1951, 72 y.o.   MRN: [id number]  HPI Here to follow up on an urgent care visit on 12-27-22 for epistaxis from both nostrils, the left more than the right. He presented with cotton balls in both nostrils, and when these were removed the blood had clotted. He as given a dose of Afrin to each side. He was sent home and instructed to use Flonase sprays every [place]. No bleeding since then. He denies any other bruising or easy bleeding. Of note he sees Dr. Leonides Schanz for MGUS, and he had labs in January showing a platelet count of 102 K.    Review of Systems  Constitutional: Negative.   HENT:  Positive for nosebleeds. Negative for congestion, rhinorrhea and sinus pressure.   Eyes: Negative.   Respiratory: Negative.    Hematological:  Does not bruise/bleed easily.       Objective:   Physical Exam Constitutional:      Appearance: Normal appearance.  HENT:     Nose: Nose normal.     Comments: No signs of bleeding     Mouth/Throat:     Pharynx: Oropharynx is clear.  Eyes:     Conjunctiva/sclera: Conjunctivae normal.  Cardiovascular:     Rate and Rhythm: Normal rate and regular rhythm.     Pulses: Normal pulses.     Heart sounds: Normal heart sounds.  Pulmonary:     Effort: Pulmonary effort is normal.     Breath sounds: Normal breath sounds.  Lymphadenopathy:     Cervical: No cervical adenopathy.  Neurological:     Mental Status: He is alert.           Assessment & Plan:  One episode of epistaxis. I do not think his thrombocytopenia is a factor with this. The main contributor is usually nasal dryness. I asked him to stop the Flonase and to use saline nasal sprays instead 3-4 times a [place]. He will see Dr. Leonides Schanz again in July. Recheck as needed. We spent a total of (33   ) minutes reviewing records and discussing these issues.  Gershon Crane, MD

## 2023-01-14 ENCOUNTER — Other Ambulatory Visit: Payer: Self-pay | Admitting: Cardiovascular Disease

## 2023-01-16 ENCOUNTER — Ambulatory Visit (INDEPENDENT_AMBULATORY_CARE_PROVIDER_SITE_OTHER): Payer: Medicare HMO | Admitting: Podiatry

## 2023-01-16 DIAGNOSIS — B351 Tinea unguium: Secondary | ICD-10-CM | POA: Diagnosis not present

## 2023-01-16 DIAGNOSIS — M79675 Pain in left toe(s): Secondary | ICD-10-CM

## 2023-01-16 DIAGNOSIS — M79674 Pain in right toe(s): Secondary | ICD-10-CM | POA: Diagnosis not present

## 2023-01-20 NOTE — Progress Notes (Signed)
   Chief Complaint  Patient presents with   Diabetes    Diabetic foot care, A1c- 8.0 BG- 130, Nail trim     SUBJECTIVE Patient with a history of diabetes mellitus presents to office today complaining of elongated, thickened nails that cause pain while ambulating in shoes.  Patient is unable to trim their own nails. Patient is here for further evaluation and treatment.  Past Medical History:  Diagnosis Date   Carpal tunnel syndrome    both hands   Colon polyps    Coronary artery disease    sees Dr. Royann Shivers   Diabetes mellitus    sees Dr. Lesly Dukes   GERD (gastroesophageal reflux disease)    History of echocardiogram    a. Echo 4/17: Moderate LVH, EF 50-55%, mild LAE, no pericardial effusion   Hyperlipidemia    Hypertension    MGUS (monoclonal gammopathy of unknown significance)    sees Dr. Jeanie Sewer   Neuropathy    gets foot exams with Dr. Gala Lewandowsky (podiatry)   Pneumonia     No Known Allergies   OBJECTIVE General Patient is awake, alert, and oriented x 3 and in no acute distress. Derm Skin is dry and supple bilateral. Negative open lesions or macerations. Remaining integument unremarkable. Nails are tender, long, thickened and dystrophic with subungual debris, consistent with onychomycosis, 1-5 bilateral. No signs of infection noted. Vasc  DP and PT pedal pulses palpable bilaterally. Temperature gradient within normal limits.  Neuro Epicritic and protective threshold sensation diminished bilaterally.  Musculoskeletal Exam No symptomatic pedal deformities noted bilateral. Muscular strength within normal limits.  ASSESSMENT 1. Diabetes Mellitus w/ peripheral neuropathy 2.  Pain due to onychomycosis of toenails bilateral  PLAN OF CARE 1. Patient evaluated today. 2. Instructed to maintain good pedal hygiene and foot care. Stressed importance of controlling blood sugar.  3. Mechanical debridement of nails 1-5 bilaterally performed using a nail nipper. Filed with  dremel without incident.  4. Return to clinic in 3 mos.     Felecia Shelling, DPM [facility]  Dr. Felecia Shelling, DPM    [address] [place], [place] [address]                Office [phone]  Fax [phone]

## 2023-01-29 ENCOUNTER — Telehealth: Payer: Self-pay

## 2023-01-29 NOTE — Progress Notes (Signed)
Care Management & Coordination Services Pharmacy Team  Reason for Encounter: Appointment Reminder  Contacted patient to confirm telephone appointment with Delano Metz, PharmD on 01/30/2023 at 1:30. Spoke with patient on 01/29/2023   Do you have any problems getting your medications? Patient denies  What is your top health concern you would like to discuss at your upcoming visit? Patient denies  Have you seen any other providers since your last visit with PCP? Patient denies  Care Gaps: AWV - completed 12/12/2022 Last foot exam - 06/14/2021 Last eye exam - 07/29/2022 Last BP - 130/82 Last A1C - 8.0 on 10/10/2022 Covid - overdue Pneumovax - postponed   Star Rating Drug: Benazepril 20 mg - last filled 12/25/2022 90 DS at [facility] Jardiance 10 mg  - last filled 01/14/2023 90 DS at [facility] Metformin 500 mg - last filled 11/28/2022 90 at [facility] verified with pharm tech.  Repaglinide 2 mg - last filled 11/06/2022 90 DS at [facility] Rosuvastatin 40 mg - last filled 12/24/2022 90 DS at [facility]  Clinical Pharmacist Assistant [phone]

## 2023-01-29 NOTE — Progress Notes (Signed)
Care Management & Coordination Services Pharmacy Note  01/29/2023 Name:  Jerome Vaughn MRN:  [id number] DOB:  Apr 06, 1951  Summary: A1C not at goal <8, due for updated check 5/6, reports sugars at goal at home BP at goal <140/90 C/o of shoulder pain  Recommendations/Changes made from today's visit: -Pt to discuss with new endocrinologist on Monday the potential to stop bromocriptine. If A1C still elevated, consider increasing Jardiance or starting Ozempic (stopped in past due to cost - 2020) -Counseled on low carb diet and routine sugar monitoring, reminded of sugar goals and proper monitoring techniques -Counseled to check BP once weekly and keep a log -Counseled to always take meloxicam with food and to avoid other NSAIDs like aleve while taking.  Follow up plan: DM call in 1 month Pharmacist visit in 3 months    Subjective: Jerome Vaughn is an 72 y.o. year old male who is a primary patient of Nelwyn Salisbury, MD.  The care coordination team was consulted for assistance with disease management and care coordination needs.    Engaged with patient by telephone for follow up visit.  Recent office visits: 01/09/23 Gershon Crane, MD - For epistaxis, stop flonas, start normal saline nasal spray 3-4x/[employer]  12/12/22 Theresa Mulligan, LPN - For AWV  11/91/4782 Gershon Crane MD - Patient was seen for essential hypertension and additional concerns. No medication changes.   Recent consult visits: 01/16/23 Gala Lewandowsky, DPM (Podiatry) - For Pain due to onychomycosis of toenails of both feet, no med changes  12/12/22 Primus Bravo, RN - For iron infusion  10/17/2022 Jeanie Sewer MD (hem/onc) - Patient was seen for Monoclonal gammopathy of unknown significance and an additional concern. No medication changes.   09/05/22 Thurmon Fair, MD (cardiology): Patient presented for CAD follow up. Prescribed Jardiance 10 mg daily. Follow up in 1 year.   Hospital visits: 12/27/22 [facility] - For Epistaxis - Start  flonase   Objective:  Lab Results  Component Value Date   CREATININE 1.09 10/17/2022   BUN 13 10/17/2022   GFR 57.23 (L) 10/10/2022   GFRNONAA >60 10/17/2022   GFRAA >60 06/14/2020   NA 137 10/17/2022   K 4.5 10/17/2022   CALCIUM 9.4 10/17/2022   CO2 24 10/17/2022   GLUCOSE 104 (H) 10/17/2022    Lab Results  Component Value Date/Time   HGBA1C 8.0 (H) 10/10/2022 10:41 AM   HGBA1C 9.2 (H) 04/25/2022 11:19 AM   GFR 57.23 (L) 10/10/2022 10:41 AM   GFR 53.15 (L) 09/06/2021 09:15 AM   MICROALBUR 3.5 (H) 04/25/2022 11:19 AM   MICROALBUR 1.2 08/22/2014 10:34 AM    Last diabetic Eye exam:  Lab Results  Component Value Date/Time   HMDIABEYEEXA No Retinopathy 09/18/2020 12:00 AM    Last diabetic Foot exam: No results found for: "HMDIABFOOTEX"   Lab Results  Component Value Date   CHOL 135 10/10/2022   HDL 52.40 10/10/2022   LDLCALC 53 09/06/2021   LDLDIRECT 43.0 10/10/2022   TRIG 219.0 (H) 10/10/2022   CHOLHDL 3 10/10/2022       Latest Ref Rng & Units 10/17/2022   10:24 AM 10/10/2022   10:41 AM 07/11/2022   10:21 AM  Hepatic Function  Total Protein 6.5 - 8.1 g/dL 7.7  7.6  8.0   Albumin 3.5 - 5.0 g/dL 4.1  4.3  4.2   AST 15 - 41 U/L 26  30  27    ALT 0 - 44 U/L 26  27  24  Alk Phosphatase 38 - 126 U/L 31  33  33   Total Bilirubin 0.3 - 1.2 mg/dL 0.7  0.4  0.4   Bilirubin, Direct 0.0 - 0.3 mg/dL  0.1      Lab Results  Component Value Date/Time   TSH 0.58 10/10/2022 10:41 AM   TSH 0.78 09/06/2021 09:15 AM       Latest Ref Rng & Units 10/17/2022   10:24 AM 10/10/2022   10:41 AM 07/11/2022   10:21 AM  CBC  WBC 4.0 - 10.5 K/uL 3.9  3.6  3.8   Hemoglobin 13.0 - 17.0 g/dL 40.9  81.1  91.4   Hematocrit 39.0 - 52.0 % 36.8  37.3  36.2   Platelets 150 - 400 K/uL 102  133.0  126     Lab Results  Component Value Date/Time   VITAMINB12 592 02/22/2020 08:28 AM    Clinical ASCVD: Yes  The 10-year ASCVD risk score (Arnett DK, et al., 2019) is: 29.5%   Values used  to calculate the score:     Age: 68 years     Sex: Male     Is Non-Hispanic African American: Yes     Diabetic: Yes     Tobacco smoker: No     Systolic Blood Pressure: 130 mmHg     Is BP treated: Yes     HDL Cholesterol: 52.4 mg/dL     Total Cholesterol: 135 mg/dL       7/82/9562   13:08 AM 10/10/2022   10:07 AM 04/18/2022   11:22 AM  Depression screen PHQ 2/9  Decreased Interest 0 0 0  Down, Depressed, Hopeless 0 0 0  PHQ - 2 Score 0 0 0  Altered sleeping   0  Tired, decreased energy   0  Change in appetite   0  Feeling bad or failure about yourself    0  Trouble concentrating   0  Moving slowly or fidgety/restless   0  Suicidal thoughts   0  PHQ-9 Score   0  Difficult doing work/chores   Not difficult at all     Social History   Tobacco Use  Smoking Status Former   Packs/Lauture: 0.25   Years: 20.00   Additional pack years: 0.00   Total pack years: 5.00   Types: Cigarettes   Quit date: 10/07/2013   Years since quitting: 9.3  Smokeless Tobacco Never  Tobacco Comments   quit cigarettes, might have a cigar 1 X month   BP Readings from Last 3 Encounters:  01/09/23 130/82  12/27/22 135/87  12/12/22 (!) 142/76   Pulse Readings from Last 3 Encounters:  01/09/23 71  12/27/22 74  12/12/22 67   Wt Readings from Last 3 Encounters:  01/09/23 190 lb 6.4 oz (86.4 kg)  12/12/22 192 lb (87.1 kg)  12/12/22 192 lb (87.1 kg)   BMI Readings from Last 3 Encounters:  01/09/23 29.82 kg/m  12/12/22 30.07 kg/m  12/12/22 30.07 kg/m    No Known Allergies  Medications Reviewed Today     Reviewed by Stann Ore, LPN (Licensed Practical Nurse) on 01/16/23 at 1236  Med List Status: <None>   Medication Order Taking? Sig Documenting Provider Last Dose Status Informant  Accu-Chek Softclix Lancets lancets [id number] No Use to monitor glucose levels 1X daily; E11.8 Shamleffer, Konrad Dolores, MD Taking Active   acetaminophen (TYLENOL) 500 MG tablet [id number] No Take 500 mg by  mouth as needed. [provider] Taking Active  APPLE CIDER VINEGAR PO [id number] No Take by mouth daily.  [provider] Taking Active   Ascorbic Acid (VITAMIN C PO) [id number] No Take 1 tablet by mouth daily. [provider] Taking Active Self  aspirin EC 81 MG tablet [id number] No Take 1 tablet (81 mg total) by mouth daily. Rosalio Macadamia, NP Taking Active Self           Med Note (DAVIS, SOPHIA A   Tue Jan 24, 2020 11:44 AM)    benazepril (LOTENSIN) 20 MG tablet [id number] No Take 1 tablet (20 mg total) by mouth daily. Croitoru, Mihai, MD Taking Active   Blood Glucose Monitoring Suppl (ACCU-CHEK AVIVA PLUS) w/Device KIT [id number] No 1 each by Does not apply route daily. Use to monitor glucose levels daily; E11.8 Shamleffer, Konrad Dolores, MD Taking Active   bromocriptine (PARLODEL) 2.5 MG tablet [id number] No Take 1 tablet (2.5 mg total) by mouth daily. Shamleffer, Konrad Dolores, MD Taking Active   carvedilol (COREG) 3.125 MG tablet [id number] No Take 1 tablet (3.125 mg total) by mouth 2 (two) times daily with a meal. Croitoru, Mihai, MD Taking Active   Cinnamon 500 MG capsule [id number] No Take 1 capsule by mouth daily. [provider] Taking Active   Coenzyme Q10 (COQ-10) 100 MG CAPS [id number] No Take 1 tablet by mouth daily. [provider] Taking Active   Cyanocobalamin (VITAMIN B-12 PO) [id number] No Take 1 tablet by mouth daily. [provider] Taking Active Self  ezetimibe (ZETIA) 10 MG tablet [id number] No Take 1 tablet (10 mg total) by mouth daily. Croitoru, Mihai, MD Taking Active   ferrous sulfate 325 (65 FE) MG tablet [id number] No Take 325 mg by mouth daily with breakfast. [provider] Taking Active Self  fluticasone (FLONASE) 50 MCG/ACT nasal spray [id number] No Place 2 sprays into both nostrils daily. Carlisle Beers, FNP Taking Active   Garlic 1000 MG CAPS [id number] No Take 1 tablet by mouth daily. [provider] Taking Active   glucose blood (ACCU-CHEK GUIDE) test strip [id number] No Use to monitor glucose levels 1X daily; E11.8 Shamleffer, Konrad Dolores, MD Taking Active   JARDIANCE 10 MG TABS tablet [id number]  TAKE 1 TABLET EVERY Gladu BEFORE BREAKFAST Croitoru, Mihai, MD  Active   meloxicam (MOBIC) 15 MG tablet [id number] No Take 15 mg by mouth daily. [provider] Taking Active   metFORMIN (GLUCOPHAGE-XR) 500 MG 24 hr tablet [id number] No Take 4 tablets (2,000 mg total) by mouth daily. Shamleffer, Konrad Dolores, MD Taking Active   Multiple Vitamin (MULTIVITAMIN) tablet [id number] No Take 1 tablet by mouth daily. [provider] Taking Active Self  nitroGLYCERIN (NITROSTAT) 0.4 MG SL tablet [id number] No Place 1 tablet (0.4 mg total) under the tongue every 5 (five) minutes as needed for chest pain. Croitoru, Mihai, MD Taking Active   Omega-3 Fatty Acids (FISH OIL PO) [id number] No Take 1 capsule by mouth daily. [provider] Taking Active   pantoprazole (PROTONIX) 40 MG tablet [id number] No Take 1 tablet (40 mg total) by mouth 2 (two) times daily. Patient needs office visit. Tressia Danas, MD Taking Active   ranolazine (RANEXA) 500 MG 12 hr tablet [id number] No Take 1 tablet (500 mg total) by mouth 2 (two) times daily. Lyn Records, MD Taking Active   repaglinide (PRANDIN) 2 MG tablet [id number] No Take 1 tablet (2 mg total) by mouth 2 (two) times daily before a meal. Romero Belling, MD Taking Active   rosuvastatin (CRESTOR)  40 MG tablet [id number] No TAKE 1 TABLET EVERY [place] Nelwyn Salisbury, MD Taking Active   sildenafil (VIAGRA) 100 MG tablet [id number] No Take 1 tablet (100 mg total) by mouth as needed for erectile dysfunction. Nelwyn Salisbury, MD Taking Active   tadalafil (CIALIS) 20 MG tablet [id number] No Take 1 tablet (20 mg total) by mouth as needed for erectile dysfunction. Nelwyn Salisbury, MD Taking Active   VITAMIN E PO [id number] No Take 1 capsule by mouth  daily. [provider] Taking Active             SDOH:  (Social Determinants of Health) assessments and interventions performed: Yes SDOH Interventions    Flowsheet Row Clinical Support from 12/12/2022 in [facility] [place] Chronic Care Management from 05/09/2022 in [facility] [place] Clinical Support from 11/29/2021 in [facility] [place] Clinical Support from 11/09/2020 in [facility] [place] [facility] [place] Chronic Care Management from 04/05/2020 in [facility] [facility] [place]  SDOH Interventions       Food Insecurity Interventions Intervention Not Indicated -- Intervention Not Indicated Intervention Not Indicated --  Housing Interventions Intervention Not Indicated -- Intervention Not Indicated Intervention Not Indicated --  Transportation Interventions Intervention Not Indicated -- Intervention Not Indicated Intervention Not Indicated Intervention Not Indicated  Utilities Interventions Intervention Not Indicated -- -- -- --  Alcohol Usage Interventions Intervention Not Indicated (Score <7) -- -- -- --  Financial Strain Interventions Intervention Not Indicated Other (Comment)  [working on sending medications to cheaper pharmacy] Intervention Not Indicated Intervention Not Indicated Intervention Not Indicated  Physical Activity Interventions Intervention Not Indicated -- Intervention Not Indicated Intervention Not Indicated --  Stress Interventions Intervention Not Indicated -- Intervention Not Indicated Intervention Not Indicated --  Social Connections Interventions Intervention Not Indicated -- Intervention Not Indicated Intervention Not Indicated --       Medication Assistance: None required.  Patient affirms current coverage meets needs.  Medication Access: Within the past 30 days, how often has patient missed a dose of medication? None Is a pillbox or other method used to  improve adherence? Yes  Factors that may affect medication adherence? no barriers identified Are meds synced by current pharmacy? No  Are meds delivered by current pharmacy? No  Does patient experience delays in picking up medications due to transportation concerns? No   [facility] Services Reviewed: Is patient disadvantaged to use [facility]?: Yes  Current Rx insurance plan: [employer] Name and location of Current pharmacy:  [facility] [id number] - [address] [place] - [address] [place] [address] Phone: [phone] Fax: [phone]  [facility] - [place], [place] - [address] [place] [address] Phone: [phone] Fax: [phone]  [facility] - [place], [place] - [address] [place] [address] Phone: [phone] Fax: [phone]  [facility] [phone] - [place], [place] - [address] AT corner of [address] [place] [address] Phone: [phone] Fax: [phone]  [facility] services reviewed with patient today?: No  Patient requests to transfer care to [facility]?: No  Reason patient declined to change pharmacies: Disadvantaged due to insurance/mail order  Compliance/Adherence/Medication fill history: Care Gaps: AWV - completed 12/12/2022 Last foot exam - 06/14/2021 Last eye exam - 07/29/2022 Last BP - 130/82 Last A1C -  8.0 on 10/10/2022 Covid - overdue Pneumovax - postponed  [facility]: Benazepril 20 mg - last filled 12/25/2022 90 DS at [facility] 10 mg  - last filled 01/14/2023 90 DS at [facility] Metformin 500 mg - last filled 11/28/2022 90 at [facility] verified with pharm tech.  Repaglinide 2 mg - last filled 11/06/2022 90 DS at [facility] Rosuvastatin 40 mg - last filled 12/24/2022 90 DS at [facility]   Assessment/Plan Hypertension (BP goal <130/80) -Controlled -Current  treatment: Benazepril 20mg  1 qd Appropriate, Effective, Safe, Accessible Carvedilol 3.125mg  BID with a meal Appropriate, Effective, Safe, Accessible -Medications previously tried: Lisinopril, Metoprolol tartrate  -Current home readings: checks about once a week and reports readings mostly <130/80, no specific log to provide numbers today -Current dietary habits: mindful of salt intake -Current exercise habits: not discussed -Denies hypotensive/hypertensive symptoms -Educated on BP goals and benefits of medications for prevention of heart attack, stroke and kidney damage; Daily salt intake goal < 2300 mg; Importance of home blood pressure monitoring; Proper BP monitoring technique; -Counseled to monitor BP at home once weekly, document, and provide log at future appointments -Counseled on diet and exercise extensively Recommended to continue current medication  Diabetes (A1c goal <8%) -Not ideally controlled -Current medications: Bromocriptine 2.5mg  1 qd Query Appropriate Jardiance 10mg  1 qd with breakfast Appropriate, Query Effective Metformin XR 500mg  4 tabs qd Appropriate, Effective, Safe, Accessible Prandin 2mg  1 BID before a meal Appropriate, Effective, Safe, Accessible -Medications previously tried: Actos, Invokana, Farxiga, Glipizide, Novolog, Levemir, Januvia, Ozempic, Onglyza -Current home glucose readings fasting glucose: upper 60s once in awhile, usually around 130 post prandial glucose: 120-130 before dinner -Denies hypoglycemic/hyperglycemic symptoms -Current meal patterns:  Not discussed in detail but tries to be carb mindful -Current exercise: still working -Educated on A1c and blood sugar goals; Complications of diabetes including kidney damage, retinal damage, and cardiovascular disease; Prevention and management of hypoglycemic episodes; Benefits of routine self-monitoring of blood sugar; -Counseled to check feet daily and get yearly eye exams -Recommended  discussing potential of stopping bromocriptine with new endo on May 6th. If A1C still elevated, consider increasing jardiance or starting ozempic again which was only stopped in past due to cost  Sherrill Raring Clinical Pharmacist [phone]

## 2023-01-30 ENCOUNTER — Ambulatory Visit: Payer: Medicare HMO

## 2023-01-30 DIAGNOSIS — M25511 Pain in right shoulder: Secondary | ICD-10-CM | POA: Diagnosis not present

## 2023-02-02 ENCOUNTER — Encounter: Payer: Self-pay | Admitting: Internal Medicine

## 2023-02-02 ENCOUNTER — Ambulatory Visit: Payer: Medicare HMO | Admitting: Internal Medicine

## 2023-02-02 VITALS — BP 130/88 | HR 74 | Ht 67.0 in | Wt 191.6 lb

## 2023-02-02 DIAGNOSIS — E1159 Type 2 diabetes mellitus with other circulatory complications: Secondary | ICD-10-CM

## 2023-02-02 DIAGNOSIS — E118 Type 2 diabetes mellitus with unspecified complications: Secondary | ICD-10-CM

## 2023-02-02 DIAGNOSIS — Z7985 Long-term (current) use of injectable non-insulin antidiabetic drugs: Secondary | ICD-10-CM

## 2023-02-02 DIAGNOSIS — Z7984 Long term (current) use of oral hypoglycemic drugs: Secondary | ICD-10-CM | POA: Diagnosis not present

## 2023-02-02 DIAGNOSIS — E119 Type 2 diabetes mellitus without complications: Secondary | ICD-10-CM | POA: Insufficient documentation

## 2023-02-02 DIAGNOSIS — E1142 Type 2 diabetes mellitus with diabetic polyneuropathy: Secondary | ICD-10-CM | POA: Diagnosis not present

## 2023-02-02 LAB — POCT GLYCOSYLATED HEMOGLOBIN (HGB A1C): Hemoglobin A1C: 6.6 % — AB (ref 4.0–5.6)

## 2023-02-02 MED ORDER — SEMAGLUTIDE(0.25 OR 0.5MG/DOS) 2 MG/3ML ~~LOC~~ SOPN: 0.5000 mg | PEN_INJECTOR | SUBCUTANEOUS | 3 refills | Status: DC

## 2023-02-02 MED ORDER — METFORMIN HCL ER 500 MG PO TB24: 2000.0000 mg | ORAL_TABLET | Freq: Every day | ORAL | 3 refills | Status: DC

## 2023-02-02 MED ORDER — REPAGLINIDE 1 MG PO TABS: 1.0000 mg | ORAL_TABLET | Freq: Two times a day (BID) | ORAL | 3 refills | Status: DC

## 2023-02-02 NOTE — Patient Instructions (Addendum)
Continue Metformin 500 mg XR, 2 tablets twice daily  Continue Jardiance 10 mg daily Start Ozempic 0.25 mg once weekly for 6 weeks than increase to 0.5 mg once weekly Decrease Prandin 1 mg , 1 tablet before Breakfast and 1 tablet before Supper    HOW TO TREAT LOW BLOOD SUGARS (Blood sugar LESS THAN 70 MG/DL) Please follow the RULE OF 15 for the treatment of hypoglycemia treatment (when your (blood sugars are less than 70 mg/dL)   STEP 1: Take 15 grams of carbohydrates when your blood sugar is low, which includes:  3-4 GLUCOSE TABS  OR 3-4 OZ OF JUICE OR REGULAR SODA OR ONE TUBE OF GLUCOSE GEL    STEP 2: RECHECK blood sugar in 15 MINUTES STEP 3: If your blood sugar is still low at the 15 minute recheck --> then, go back to STEP 1 and treat AGAIN with another 15 grams of carbohydrates.

## 2023-02-02 NOTE — Progress Notes (Signed)
Name: Jerome Vaughn  Age/ Sex: 72 y.o., male   MRN/ DOB: [id number], 07-24-1951     PCP: Nelwyn Salisbury, MD   Reason for Endocrinology Evaluation: Type 2 Diabetes Mellitus  Initial Endocrine Consultative Visit: 10/04/2013    PATIENT IDENTIFIER: [name] is a 72 y.o. male with a past medical history of DM, HTN, CAD, dyslipidemia the patient has followed with Endocrinology clinic since 10/04/2013 for consultative assistance with management of his diabetes.  DIABETIC HISTORY:  Jerome Vaughn was diagnosed with DM 2002.  Pioglitazone-edema. His hemoglobin A1c has ranged from 6.5% in 2021, peaking at 9.2% in 2023.   He was followed by Dr. Everardo All from 2015 until 12/2021  Patient self discontinued bromocriptine 06/2022 Started Ozempic 01/2023  SUBJECTIVE:   During the last visit (01/24/2022): Saw Dr. Everardo All  Today (02/02/2023): [name] is here for follow-up on diabetes management.  He checks his blood sugars 2 times daily. The patient has  had hypoglycemic episodes since the last clinic visit, which typically occur before breakfast  , he is symptomatic with these symptoms   Patient had a follow-up with podiatry on 01/16/2023 Denies nausea, vomiting  Denies constipation or diarrhea    He received Jardiance though Cardiology through [facility]   He has been out of Bromocriptione since 06/2023 He does exercise with walking and biking      HOME DIABETES REGIMEN:  Metformin 500 mg XR, 4 tabs BID  Jardiance 10 mg daily- through cardiology  Prandin 2 mg twice daily Bromocriptine 2.5 mg daily     Statin: Yes ACE-I/ARB: Yes   METER DOWNLOAD SUMMARY: did not bring     DIABETIC COMPLICATIONS: Microvascular complications:  Peripheral neuropathy Denies:  Last Eye Exam: Completed 06/2022  Macrovascular complications:  CAD Denies:  CVA, PVD   HISTORY:  Past Medical History:  Past Medical History:  Diagnosis Date   Carpal tunnel syndrome    both hands   Colon polyps     Coronary artery disease    sees Dr. Royann Shivers   Diabetes mellitus    sees Dr. Lesly Dukes   GERD (gastroesophageal reflux disease)    History of echocardiogram    a. Echo 4/17: Moderate LVH, EF 50-55%, mild LAE, no pericardial effusion   Hyperlipidemia    Hypertension    MGUS (monoclonal gammopathy of unknown significance)    sees Dr. Jeanie Sewer   Neuropathy    gets foot exams with Dr. Gala Lewandowsky (podiatry)   Pneumonia    Past Surgical History:  Past Surgical History:  Procedure Laterality Date   CARDIAC CATHETERIZATION N/A 11/21/2015   Procedure: Left Heart Cath and Coronary Angiography;  Surgeon: Lyn Records, MD;  Location: [facility] INVASIVE CV LAB;  Service: Cardiovascular;  Laterality: N/A;   COLONOSCOPY  02/23/2020   per Dr. Orvan Falconer, tubular adenomas, repeat in 3 yrs    colonsocopy  12/07/2009   per Dr. Matthias Hughs, benign polyps, repeat in 10 yrs    CORONARY ARTERY BYPASS GRAFT N/A 11/22/2015   Procedure: CORONARY ARTERY BYPASS GRAFTING (CABG) times five using the left internal mammary, right greater saphenous vein EVH, and left thigh greater saphenous vein EVH;  Surgeon: Kerin Perna, MD;  Location: [facility] OR;  Service: Open Heart Surgery;  Laterality: N/A;   CORONARY PRESSURE/FFR STUDY N/A 12/14/2018   Procedure: INTRAVASCULAR PRESSURE WIRE/FFR STUDY;  Surgeon: Corky Crafts, MD;  Location: [facility] INVASIVE CV LAB;  Service: Cardiovascular;  Laterality: N/A;   frozen shoulder release Right  LEFT HEART CATH AND CORS/GRAFTS ANGIOGRAPHY N/A 10/27/2017   Procedure: LEFT HEART CATH AND CORS/GRAFTS ANGIOGRAPHY;  Surgeon: Lyn Records, MD;  Location: [name];  Service: Cardiovascular;  Laterality: N/A;   LEFT HEART CATH AND CORS/GRAFTS ANGIOGRAPHY N/A 12/14/2018   Procedure: LEFT HEART CATH AND CORS/GRAFTS ANGIOGRAPHY;  Surgeon: Corky Crafts, MD;  Location: [facility] INVASIVE CV LAB;  Service: Cardiovascular;  Laterality: N/A;   TEE WITHOUT CARDIOVERSION N/A 11/22/2015    Procedure: TRANSESOPHAGEAL ECHOCARDIOGRAM (TEE);  Surgeon: Kerin Perna, MD;  Location: [facility] OR;  Service: Open Heart Surgery;  Laterality: N/A;   WRIST GANGLION EXCISION Right    Social History:  reports that he quit smoking about 9 years ago. His smoking use included cigarettes. He has a 5.00 pack-year smoking history. He has never used smokeless tobacco. He reports current alcohol use of about 3.0 - 4.0 standard drinks of alcohol per week. He reports that he does not use drugs. Family History:  Family History  Problem Relation Age of Onset   Heart failure Father    Anuerysm Brother        AAA   Hyperlipidemia Sister    Diabetes Sister    Hypertension Sister    Kidney failure Sister    Diabetes Maternal Grandmother    Hyperlipidemia Sister    Diabetes Sister    Colon polyps Sister    Kidney disease Sister    Other Brother        COVID 57   Diabetes Son    Heart disease Son    Heart attack Neg Hx    Colon cancer Neg Hx    Stomach cancer Neg Hx    Esophageal cancer Neg Hx      HOME MEDICATIONS: Allergies as of 02/02/2023   No Known Allergies      Medication List        Accurate as of Feb 02, 2023 12:14 PM. If you have any questions, ask your nurse or doctor.          Accu-Chek Aviva Plus w/Device Kit 1 each by Does not apply route daily. Use to monitor glucose levels daily; E11.8   Accu-Chek Guide test strip Generic drug: glucose blood Use to monitor glucose levels 1X daily; E11.8   Accu-Chek Softclix Lancets lancets Use to monitor glucose levels 1X daily; E11.8   acetaminophen 500 MG tablet Commonly known as: TYLENOL Take 500 mg by mouth as needed.   APPLE CIDER VINEGAR PO Take by mouth daily.   aspirin EC 81 MG tablet Take 1 tablet (81 mg total) by mouth daily.   benazepril 20 MG tablet Commonly known as: LOTENSIN Take 1 tablet (20 mg total) by mouth daily.   bromocriptine 2.5 MG tablet Commonly known as: PARLODEL Take 1 tablet (2.5 mg total) by  mouth daily.   carvedilol 3.125 MG tablet Commonly known as: COREG Take 1 tablet (3.125 mg total) by mouth 2 (two) times daily with a meal.   Cinnamon 500 MG capsule Take 1 capsule by mouth daily.   CoQ-10 100 MG Caps Take 1 tablet by mouth daily.   ezetimibe 10 MG tablet Commonly known as: ZETIA Take 1 tablet (10 mg total) by mouth daily.   ferrous sulfate 325 (65 FE) MG tablet Take 325 mg by mouth daily with breakfast.   FISH OIL PO Take 1 capsule by mouth daily.   fluticasone 50 MCG/ACT nasal spray Commonly known as: FLONASE Place 2 sprays into both nostrils  daily.   Garlic 1000 MG Caps Take 1 tablet by mouth daily.   Jardiance 10 MG Tabs tablet Generic drug: empagliflozin TAKE 1 TABLET EVERY Farro BEFORE BREAKFAST   meloxicam 15 MG tablet Commonly known as: MOBIC Take 15 mg by mouth daily.   metFORMIN 500 MG 24 hr tablet Commonly known as: GLUCOPHAGE-XR Take 4 tablets (2,000 mg total) by mouth daily.   multivitamin tablet Take 1 tablet by mouth daily.   nitroGLYCERIN 0.4 MG SL tablet Commonly known as: NITROSTAT Place 1 tablet (0.4 mg total) under the tongue every 5 (five) minutes as needed for chest pain.   pantoprazole 40 MG tablet Commonly known as: PROTONIX Take 1 tablet (40 mg total) by mouth 2 (two) times daily. Patient needs office visit.   ranolazine 500 MG 12 hr tablet Commonly known as: RANEXA Take 1 tablet (500 mg total) by mouth 2 (two) times daily.   repaglinide 2 MG tablet Commonly known as: PRANDIN Take 1 tablet (2 mg total) by mouth 2 (two) times daily before a meal.   rosuvastatin 40 MG tablet Commonly known as: CRESTOR TAKE 1 TABLET EVERY Basaldua   sildenafil 100 MG tablet Commonly known as: VIAGRA Take 1 tablet (100 mg total) by mouth as needed for erectile dysfunction.   tadalafil 20 MG tablet Commonly known as: CIALIS Take 1 tablet (20 mg total) by mouth as needed for erectile dysfunction.   VITAMIN B-12 PO Take 1 tablet by  mouth daily.   VITAMIN C PO Take 1 tablet by mouth daily.   VITAMIN E PO Take 1 capsule by mouth daily.         OBJECTIVE:   Vital Signs: BP 130/88 (BP Location: Left Arm, Patient Position: Sitting, Cuff Size: Normal)   Pulse 74   Ht 5\' 7"  (1.702 m)   Wt 191 lb 9.6 oz (86.9 kg)   SpO2 95%   BMI 30.01 kg/m   Wt Readings from Last 3 Encounters:  02/02/23 191 lb 9.6 oz (86.9 kg)  01/09/23 190 lb 6.4 oz (86.4 kg)  12/12/22 192 lb (87.1 kg)     Exam: General: Pt appears well and is in NAD  Neck: General: Supple without adenopathy. Thyroid: Thyroid size normal.  No goiter or nodules appreciated.   Lungs: Clear with good BS bilat   Heart: RRR   Abdomen:  soft, nontender  Extremities: No pretibial edema.   Neuro: MS is good with appropriate affect, pt is alert and Ox3    DM foot exam: Per podiatry 01/16/2023       DATA REVIEWED:  Lab Results  Component Value Date   HGBA1C 6.6 (A) 02/02/2023   HGBA1C 8.0 (H) 10/10/2022   HGBA1C 9.2 (H) 04/25/2022    Latest Reference Range & Units 10/17/22 10:24  Sodium 135 - 145 mmol/L 137  Potassium 3.5 - 5.1 mmol/L 4.5  Chloride 98 - 111 mmol/L 106  CO2 22 - 32 mmol/L 24  Glucose 70 - 99 mg/dL 161 (H)  BUN 8 - 23 mg/dL 13  Creatinine 0.96 - 0.45 mg/dL 4.09  Calcium 8.9 - 81.1 mg/dL 9.4  Anion gap 5 - 15  7  Alkaline Phosphatase 38 - 126 U/L 31 (L)  Albumin 3.5 - 5.0 g/dL 4.1  AST 15 - 41 U/L 26  ALT 0 - 44 U/L 26  Total Protein 6.5 - 8.1 g/dL 7.7  Total Bilirubin 0.3 - 1.2 mg/dL 0.7  GFR, Est Non African American >60 mL/min >60  Old records , labs and images have been reviewed.   ASSESSMENT / PLAN / RECOMMENDATIONS:   1) Type 2 Diabetes Mellitus, Optimally controlled, With neuropathic and macrovascular complications - Most recent A1c of 6.6 %. Goal A1c < 7.0 %.     -A1c at goal -Patient interested in GLP-1 agonist, we discussed reducing repaglinide.  Discussed cardiovascular, renal, and weight benefits with the  GLP-1 agonist, caution against GI side effects -He has not been on bromocriptine since 06/2022, will discontinue -He is on Jardiance to cardiology -Patient is interested in CGM technology, I did explain to the patient I am not aware of [employer] covering CGM technology without being on insulin, he may check   MEDICATIONS: Start Ozempic 0.25 mg weekly, for 6 weeks then increase to 0.5 mg weekly Continue metformin 500 mg XR, 2 tabs twice daily Continue Jardiance 10 mg daily Decrease Prandin 1 mg twice daily  EDUCATION / INSTRUCTIONS: BG monitoring instructions: Patient is instructed to check his blood sugars 1 times daily. Call [facility] if: BG persistently < 70  I reviewed the Rule of 15 for the treatment of hypoglycemia in detail with the patient. Literature supplied.    2) Diabetic complications:  Eye: Does not have known diabetic retinopathy.  Neuro/ Feet: Does  have known diabetic peripheral neuropathy .  Renal: Patient does not have known baseline CKD. He   is on an ACEI/ARB at present.     F/U in 6 months   Signed electronically by: Lyndle Herrlich, MD  [facility]  [facility] [address] [place] [place], [place] [address] Phone: [phone] FAX: [phone]   CC: Nelwyn Salisbury, MD [address] [place] [address] Phone: [phone]  Fax: [phone]  Return to Endocrinology clinic as below: Future Appointments  Date Time Provider Department Center  03/06/2023  9:30 AM Arnaldo Natal, NP [facility]  04/10/2023  9:00 AM [facility] None  04/17/2023 10:00 AM Jaci Standard, MD [facility] None  04/24/2023 12:15 PM Candelaria Stagers, DPM [facility]  12/18/2023 11:30 AM [facility] WELLNESS VISIT [facility]

## 2023-02-03 ENCOUNTER — Other Ambulatory Visit: Payer: Self-pay

## 2023-02-03 ENCOUNTER — Other Ambulatory Visit: Payer: Self-pay | Admitting: Internal Medicine

## 2023-02-03 DIAGNOSIS — E118 Type 2 diabetes mellitus with unspecified complications: Secondary | ICD-10-CM

## 2023-02-03 MED ORDER — ACCU-CHEK GUIDE VI STRP: ORAL_STRIP | 3 refills | Status: DC

## 2023-02-04 ENCOUNTER — Encounter: Payer: Self-pay | Admitting: Cardiovascular Disease

## 2023-02-05 ENCOUNTER — Other Ambulatory Visit: Payer: Self-pay

## 2023-02-11 ENCOUNTER — Other Ambulatory Visit: Payer: Self-pay

## 2023-02-11 MED ORDER — RANOLAZINE ER 500 MG PO TB12: 500.0000 mg | ORAL_TABLET | Freq: Two times a day (BID) | ORAL | 1 refills | Status: DC

## 2023-02-11 MED ORDER — EMPAGLIFLOZIN 10 MG PO TABS: ORAL_TABLET | ORAL | 3 refills | Status: DC

## 2023-02-12 ENCOUNTER — Other Ambulatory Visit: Payer: Self-pay | Admitting: Family Medicine

## 2023-02-12 DIAGNOSIS — M25511 Pain in right shoulder: Secondary | ICD-10-CM | POA: Diagnosis not present

## 2023-02-13 ENCOUNTER — Telehealth: Payer: Self-pay | Admitting: Family Medicine

## 2023-02-13 ENCOUNTER — Other Ambulatory Visit: Payer: Self-pay

## 2023-02-13 NOTE — Telephone Encounter (Signed)
Pt came in office and requested to have the following prescriptions to be sent to  [facility] - [place], [place] - [address] Phone: [phone]  Fax: [phone]    Prescriptions: Sildenafil (Viagra) and Tadalafil (Cialis).

## 2023-02-16 MED ORDER — TADALAFIL 20 MG PO TABS: ORAL_TABLET | ORAL | 5 refills | Status: DC

## 2023-02-16 MED ORDER — SILDENAFIL CITRATE 100 MG PO TABS: 100.0000 mg | ORAL_TABLET | ORAL | 5 refills | Status: DC | PRN

## 2023-02-16 NOTE — Telephone Encounter (Signed)
He can use both of these by alternating them.(Not at the same time)

## 2023-02-17 NOTE — Telephone Encounter (Signed)
Pt aware.

## 2023-02-24 DIAGNOSIS — M75121 Complete rotator cuff tear or rupture of right shoulder, not specified as traumatic: Secondary | ICD-10-CM | POA: Diagnosis not present

## 2023-02-26 DIAGNOSIS — M75120 Complete rotator cuff tear or rupture of unspecified shoulder, not specified as traumatic: Secondary | ICD-10-CM | POA: Insufficient documentation

## 2023-03-02 ENCOUNTER — Encounter: Payer: Self-pay | Admitting: Hematology and Oncology

## 2023-03-02 ENCOUNTER — Encounter: Payer: Self-pay | Admitting: Oncology

## 2023-03-05 NOTE — Progress Notes (Signed)
03/06/2023 Anabel Hollerbach Peto [id number] [birth year]   CHIEF COMPLAINT: Refill Pantoprazole, schedule a colonoscopy   HISTORY OF PRESENT ILLNESS: Ayven E. Pe is a 72 year old male with a past medical history of osteoarthritis, hypertension, hyperlipidemia, coronary artery disease s/p 4 vessels CABG 10/2015, combined systolic and diastolic CHF with LVEF 35 to 45% per angiography in 2020 and 45 - 50% per ECHO 11/2018, DM type II, CKD stage II, MGUS, thrombocytopenia, GERD and colon polyps. He presents to our office today refill his Pantoprazole prescription and to review the colonoscopy procedure/prep as he is prescheduled for colonoscopy Dr. Adela Lank 03/27/2023.  He underwent an EGD 03/21/2020 by Dr. Orvan Falconer which showed grade a esophagitis, peptic duodenitis and H. pylori gastritis which was treated.  Posttreatment H. pylori stool antigen was not done.  He remains on Pantoprazole 40 mg p.o. twice daily.  If he stops Pantoprazole for more than 4 days his reflux symptoms recur.  No dysphagia.  He underwent a colonoscopy 02/23/2020 which identified 6 tubular adenomatous/hyperplastic polyps removed from the colon.  He was advised to repeat a colonoscopy in 3 years.  Endorsed having drops of bright red blood noted in the toilet water and on the toilet tissue a few occasions 09/2022 without further recurrence.  No rectal pain.  He takes ASA 81 mg once daily.  He takes Meloxicam 15 mg daily for shoulder and knee pain for the past 6 months.  Sister with history of colon polyps.  He has MGUS, IDA and thrombocytopenia followed by hematologist Dr. Leonides Schanz.  Labs 10/17/2022 showed a WBC count of 3.9.  Hemoglobin 12.3.  Platelet 102.  Iron 261.  Saturation ratio was 61.  TIBC 431.  Ferritin 21.  He achieved Venofer 200mg  IV once weekly x 03 Oct 2022 and most recently received IV iron  sucrose on 3/8 and 12/12/2022.  Abdominal sonogram 02/21/2020 due to pancytopenia, thrombocytopenia showed a normal liver and spleen.  History of  CAD status post four-vessel CABG 2017.  He was last seen by cardiologist Dr. Royann Shivers 09/05/2022. At that time, his cardiac status was stable and he was instructed to follow-up in 1 year. He denies having any chest pain, palpitations or shortness of breath.  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     Latest Ref Rng & Units 03/06/2023   10:17 AM 10/17/2022   10:24 AM 10/10/2022   10:41 AM  CBC  WBC 4.0 - 10.5 K/uL 3.7  3.9  3.6   Hemoglobin 13.0 - 17.0 g/dL 13.0  86.5  78.4   Hematocrit 39.0 - 52.0 % 39.2  36.8  37.3   Platelets 150.0 - 400.0 K/uL 129.0  102  133.0        Latest Ref Rng & Units 03/06/2023   10:17 AM 10/17/2022   10:24 AM 10/10/2022   10:41 AM  CMP  Glucose 70 - 99 mg/dL 696  295  99   BUN 6 - 23 mg/dL 19  13  22    Creatinine 0.40 - 1.50 mg/dL 2.84  1.32  4.40   Sodium 135 - 145 mEq/L 137  137  137   Potassium 3.5 - 5.1 mEq/L 4.4  4.5  4.2   Chloride 96 - 112 mEq/L 105  106  105   CO2 19 - 32 mEq/L 23  24  26    Calcium 8.4 - 10.5 mg/dL 9.4  9.4  9.4   Total Protein 6.5 - 8.1 g/dL  7.7  7.6   Total Bilirubin 0.3 - 1.2 mg/dL  0.7  0.4   Alkaline Phos 38 - 126 U/L  31  33   AST 15 - 41 U/L  26  30   ALT 0 - 44 U/L  26  27     ECHO 12/13/2018: 1. The left ventricle has mildly reduced systolic function, with an  ejection fraction of 45-50%. The cavity size was normal. Left ventricular  diastolic Doppler parameters are indeterminate. There is abnormal septal  motion consistent with post-operative  status. Left ventricular diffuse hypokinesis.   2. The right ventricle has normal systolic function. The cavity was  normal. There is no increase in right ventricular wall thickness.   3. Left atrial size was mildly dilated.   4. There is mild mitral annular calcification present.   5. The aortic valve is tricuspid Mild thickening of the aortic valve Mild  calcification of the aortic valve.   Cardiac catheterization 12/14/2018: Ost LM to Dist LM lesion is 65%  stenosed. FFR of this lesion into the distal circumflex was negative, 0.95. Prox LAD lesion is 85% stenosed. LIMA to LAD is patent. 1st Diag lesion is 100% stenosed. SVG to diagonal is occluded. Ost 2nd Mrg to 2nd Mrg lesion is 65% stenosed. Jump graft from OM1 to OM2 is occluded. Ost 1st Mrg lesion is 75% stenosed. SVG to OM1 portion is patent. Mid RCA lesion is 100% stenosed. SVG to PDA is patent. Ostial PDA disease is unchanged from prior. Ost RPDA to RPDA lesion is 95% stenosed. The left ventricular ejection fraction is 35-45% by visual estimate. There is mild to moderate left ventricular systolic dysfunction. LV end diastolic pressure is normal.   Continue medical therapy for small vessel disease and for LV dysfunction.    PAST GI PROCEDURES:  EGD 03/21/2020 by Dr. Orvan Falconer: - LA Grade A esophagitis with no bleeding. Biopsied.  - Gastritis. Biopsied.  - Erythematous duodenopathy. Biopsied.  - The examination was otherwise normal.  Colonoscopy 02/23/2020: - Non-bleeding external and internal hemorrhoids - Four 1 mm polyps in the transverse colon, at the hepatic flexure, in the ascending colon and in the cecum, removed with a cold biopsy forceps. Resected and retrieved.  -Two 2 mm polyp in the descending colon and hepatic flexure, removed with a cold snare. Resected and retrieved.  - Diverticulosis in the entire examined colon.  - The examination was otherwise normal on direct and retroflexion views.  1. Surgical [P], duodenum - PEPTIC DUODENITIS. - THERE IS NO EVIDENCE OF SIGNIFICANT VILLOUS ATROPHY, DYSPLASIA OR MALIGNANCY. 2. 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 - THERE IS NO EVIDENCE OF DYSPLASIA OR MALIGNANCY. - SEE COMMENT. 3. Surgical [P], distal esophagus - GASTROESOPHAGEAL JUNCTION MUCOSA WITH MILD INFLAMMATION CONSISTENT WITH GASTROESOPHAGEAL REFLUX. - THERE IS NO EVIDENCE OF GOBLET CELL METAPLASIA, DYSPLASIA OR  MALIGNANCY. 4. Surgical [P], colon, ascending and cecum, HF, transverse, descending, polyp (6) - TUBULAR ADENOMA(S). - HYPERPLASTIC POLYP(S). - HIGH GRADE DYSPLASIA IS NOT IDENTIFIED.  Past Medical History:  Diagnosis Date   Carpal tunnel syndrome    both hands   Colon polyps    Coronary artery disease    sees Dr. Royann Shivers   Diabetes mellitus    sees Dr. Lesly Dukes   GERD (gastroesophageal reflux disease)    History of echocardiogram    a. Echo 4/17: Moderate LVH, EF 50-55%, mild LAE, no pericardial effusion   Hyperlipidemia    Hypertension    MGUS (  monoclonal gammopathy of unknown significance)    sees Dr. Jeanie Sewer   Neuropathy    gets foot exams with Dr. Gala Lewandowsky (podiatry)   Pneumonia    Rotator cuff injury    Past Surgical History:  Procedure Laterality Date   CARDIAC CATHETERIZATION N/A 11/21/2015   Procedure: Left Heart Cath and Coronary Angiography;  Surgeon: Lyn Records, MD;  Location: [facility] INVASIVE CV LAB;  Service: Cardiovascular;  Laterality: N/A;   COLONOSCOPY  02/23/2020   per Dr. Orvan Falconer, tubular adenomas, repeat in 3 yrs    colonsocopy  12/07/2009   per Dr. Matthias Hughs, benign polyps, repeat in 10 yrs    CORONARY ARTERY BYPASS GRAFT N/A 11/22/2015   Procedure: CORONARY ARTERY BYPASS GRAFTING (CABG) times five using the left internal mammary, right greater saphenous vein EVH, and left thigh greater saphenous vein EVH;  Surgeon: Kerin Perna, MD;  Location: [facility] OR;  Service: Open Heart Surgery;  Laterality: N/A;   CORONARY PRESSURE/FFR STUDY N/A 12/14/2018   Procedure: INTRAVASCULAR PRESSURE WIRE/FFR STUDY;  Surgeon: Corky Crafts, MD;  Location: [facility] INVASIVE CV LAB;  Service: Cardiovascular;  Laterality: N/A;   frozen shoulder release Right    LEFT HEART CATH AND CORS/GRAFTS ANGIOGRAPHY N/A 10/27/2017   Procedure: LEFT HEART CATH AND CORS/GRAFTS ANGIOGRAPHY;  Surgeon: Lyn Records, MD;  Location: [name];  Service: Cardiovascular;   Laterality: N/A;   LEFT HEART CATH AND CORS/GRAFTS ANGIOGRAPHY N/A 12/14/2018   Procedure: LEFT HEART CATH AND CORS/GRAFTS ANGIOGRAPHY;  Surgeon: Corky Crafts, MD;  Location: [facility] INVASIVE CV LAB;  Service: Cardiovascular;  Laterality: N/A;   TEE WITHOUT CARDIOVERSION N/A 11/22/2015   Procedure: TRANSESOPHAGEAL ECHOCARDIOGRAM (TEE);  Surgeon: Kerin Perna, MD;  Location: [facility] OR;  Service: Open Heart Surgery;  Laterality: N/A;   WRIST GANGLION EXCISION Right    Social History: Married.  He is a Estate agent.  Reports that he quit smoking about 9 years ago. His smoking use included cigarettes. He has a 5.00 pack-year smoking history. He has never used smokeless tobacco. He reports current alcohol use of about 3.0 - 4.0 standard drinks of alcohol per week. He reports that he does not use drugs.  Family History: family history includes Anuerysm in his brother; Colon polyps in his sister; Diabetes in his maternal grandmother, sister, sister, and son; Heart disease in his son; Heart failure in his father; Hyperlipidemia in his sister and sister; Hypertension in his sister; Kidney disease in his sister; Kidney failure in his sister; Other in his brother.  No Known Allergies    Outpatient Encounter Medications as of 03/06/2023  Medication Sig   Accu-Chek Softclix Lancets lancets Use to monitor glucose levels 1X daily; E11.8   acetaminophen (TYLENOL) 500 MG tablet Take 500 mg by mouth as needed.   APPLE CIDER VINEGAR PO Take by mouth daily.    Ascorbic Acid (VITAMIN C PO) Take 1 tablet by mouth daily.   aspirin EC 81 MG tablet Take 1 tablet (81 mg total) by mouth daily.   benazepril (LOTENSIN) 20 MG tablet Take 1 tablet (20 mg total) by mouth daily.   Blood Glucose Monitoring Suppl (ACCU-CHEK GUIDE) w/Device KIT USE AS DIRECTED   carvedilol (COREG) 3.125 MG tablet Take 1 tablet (3.125 mg total) by mouth 2 (two) times daily with a meal.   Cinnamon 500 MG capsule Take 1 capsule by mouth daily.    Coenzyme Q10 (COQ-10) 100 MG CAPS Take 1 tablet by mouth daily.  Cyanocobalamin (VITAMIN B-12 PO) Take 1 tablet by mouth daily.   empagliflozin (JARDIANCE) 10 MG TABS tablet TAKE 1 TABLET EVERY [place] BEFORE BREAKFAST   ezetimibe (ZETIA) 10 MG tablet Take 1 tablet (10 mg total) by mouth daily.   ferrous sulfate 325 (65 FE) MG tablet Take 325 mg by mouth daily with breakfast.   fluticasone (FLONASE) 50 MCG/ACT nasal spray Place 2 sprays into both nostrils daily.   Garlic 1000 MG CAPS Take 1 tablet by mouth daily.   glucose blood (ACCU-CHEK GUIDE) test strip Use to monitor glucose levels 1X daily; E11.8   meloxicam (MOBIC) 15 MG tablet Take 15 mg by mouth daily.   metFORMIN (GLUCOPHAGE-XR) 500 MG 24 hr tablet Take 4 tablets (2,000 mg total) by mouth daily.   Multiple Vitamin (MULTIVITAMIN) tablet Take 1 tablet by mouth daily.   Na Sulfate-K Sulfate-Mg Sulf 17.5-3.13-1.6 GM/177ML SOLN Take 1 kit by mouth once for 1 dose.   nitroGLYCERIN (NITROSTAT) 0.4 MG SL tablet Place 1 tablet (0.4 mg total) under the tongue every 5 (five) minutes as needed for chest pain.   Omega-3 Fatty Acids (FISH OIL PO) Take 1 capsule by mouth daily.   ranolazine (RANEXA) 500 MG 12 hr tablet Take 1 tablet (500 mg total) by mouth 2 (two) times daily.   repaglinide (PRANDIN) 1 MG tablet Take 1 tablet (1 mg total) by mouth 2 (two) times daily before a meal.   rosuvastatin (CRESTOR) 40 MG tablet TAKE 1 TABLET EVERY [place]   sildenafil (VIAGRA) 100 MG tablet Take 1 tablet (100 mg total) by mouth as needed for erectile dysfunction.   tadalafil (CIALIS) 20 MG tablet TAKE 1 TABLET BY MOUTH AS NEEDED FOR ERECTILE DYSFUNCTION   VITAMIN E PO Take 1 capsule by mouth daily.   [DISCONTINUED] pantoprazole (PROTONIX) 40 MG tablet Take 1 tablet (40 mg total) by mouth 2 (two) times daily. Patient needs office visit.   pantoprazole (PROTONIX) 40 MG tablet Take 1 tablet (40 mg total) by mouth 2 (two) times daily. Patient needs office visit.    Semaglutide,0.25 or 0.5MG /DOS, 2 MG/3ML SOPN Inject 0.5 mg into the skin once a week. (Patient not taking: Reported on 03/06/2023)   No facility-administered encounter medications on file as of 03/06/2023.    REVIEW OF SYSTEMS:  Gen: Denies fever, sweats or chills. No weight loss.  CV: No chest pain, mild chronic RLE edema since CABG.  Resp: Denies cough, shortness of breath of hemoptysis.  GI: See HPI. GU : Denies urinary burning, blood in urine, increased urinary frequency or incontinence. MS: + Shoulder and knee pain.  Derm: Denies rash, itchiness, skin lesions or unhealing ulcers. Psych: Denies depression, anxiety, memory loss or confusion. Heme: Denies bruising, easy bleeding. Neuro:  Denies headaches, dizziness or paresthesias. Endo:  + DM type II.   PHYSICAL EXAM: BP 132/84   Pulse 77   Ht 5' 7.75" (1.721 m)   Wt 191 lb (86.6 kg)   SpO2 97%   BMI 29.26 kg/m   General: 72 year old male in no acute distress. Head: Normocephalic and atraumatic. Eyes:  Sclerae non-icteric, conjunctive pink. Ears: Normal auditory acuity. Mouth: Dentition intact. No ulcers or lesions.  Neck: Supple, no lymphadenopathy or thyromegaly.  Lungs: Clear bilaterally to auscultation without wheezes, crackles or rhonchi. Heart: Regular rate and rhythm. No murmur, rub or gallop appreciated.  Abdomen: Soft, nontender, nondistended. No masses. No hepatosplenomegaly. Normoactive bowel sounds x 4 quadrants.  Rectal: Deferred. Musculoskeletal: Symmetrical with no gross deformities. Skin: Warm  and dry. No rash or lesions on visible extremities. Extremities: No edema. Neurological: Alert oriented x 4, no focal deficits.  Psychological:  Alert and cooperative. Normal mood and affect.  ASSESSMENT AND PLAN:  72 year old male with a history of GERD and H. Pylori (treated 02/2020) on Pantoprazole 40 mg p.o. twice daily.  Patient develops acid reflux symptoms if he stops PPI for 4 days. -EGD to assess for H.  Pylori eradication, GERD and to rule out UGI  etiology for IDA. EGD benefits and risks discussed including risk with sedation, risk of bleeding, perforation and infection  -Continue Pantoprazole 40 mg twice daily  History of colon polyps. Colonoscopy 02/23/2020 which identified 6 tubular adenomatous/hyperplastic polyps removed from the colon.  Sister with history of colon polyps. -Colonoscopy benefits and risks discussed including risk with sedation, risk of bleeding, perforation and infection   Rectal bleeding Jan 2024 without recurrence  -Colonoscopy as ordered above   Iron deficiency anemia, MGUS and thrombocytopenia. Last received IV iron x 29 November 2022.  Abdominal sonogram 01/2020 negative for cirrhosis -CBC and IBC + ferritin  -Continue follow-up with Dr. Jeanie Sewer  CAD s/p 4 vessel CABG 2017, stable.  No angina.  Patient remains active.  CHF.  LVEF 45 to 50% per ECHO and LVEF 35 to 45% per cardiac cath 11/2018  DM type II         CC:  Nelwyn Salisbury, MD

## 2023-03-06 ENCOUNTER — Ambulatory Visit: Payer: Medicare HMO | Admitting: Nurse Practitioner

## 2023-03-06 ENCOUNTER — Encounter: Payer: Self-pay | Admitting: Nurse Practitioner

## 2023-03-06 ENCOUNTER — Telehealth: Payer: Self-pay

## 2023-03-06 ENCOUNTER — Other Ambulatory Visit (INDEPENDENT_AMBULATORY_CARE_PROVIDER_SITE_OTHER): Payer: Medicare HMO

## 2023-03-06 VITALS — BP 132/84 | HR 77 | Ht 67.75 in | Wt 191.0 lb

## 2023-03-06 DIAGNOSIS — D509 Iron deficiency anemia, unspecified: Secondary | ICD-10-CM

## 2023-03-06 DIAGNOSIS — K635 Polyp of colon: Secondary | ICD-10-CM

## 2023-03-06 DIAGNOSIS — I25708 Atherosclerosis of coronary artery bypass graft(s), unspecified, with other forms of angina pectoris: Secondary | ICD-10-CM

## 2023-03-06 DIAGNOSIS — K219 Gastro-esophageal reflux disease without esophagitis: Secondary | ICD-10-CM

## 2023-03-06 DIAGNOSIS — Z8619 Personal history of other infectious and parasitic diseases: Secondary | ICD-10-CM | POA: Diagnosis not present

## 2023-03-06 LAB — BASIC METABOLIC PANEL
BUN: 19 mg/dL (ref 6–23)
CO2: 23 mEq/L (ref 19–32)
Calcium: 9.4 mg/dL (ref 8.4–10.5)
Chloride: 105 mEq/L (ref 96–112)
Creatinine, Ser: 1.09 mg/dL (ref 0.40–1.50)
GFR: 69.2 mL/min (ref >60.00)
Glucose, Bld: 113 mg/dL — ABNORMAL HIGH (ref 70–99)
Potassium: 4.4 mEq/L (ref 3.5–5.1)
Sodium: 137 mEq/L (ref 135–145)

## 2023-03-06 LAB — CBC
HCT: 39.2 % (ref 39.0–52.0)
Hemoglobin: 13 g/dL (ref 13.0–17.0)
MCHC: 33.2 g/dL (ref 30.0–36.0)
MCV: 91.6 fl (ref 78.0–100.0)
Platelets: 129 10*3/uL — ABNORMAL LOW (ref 150.0–400.0)
RBC: 4.28 Mil/uL (ref 4.22–5.81)
RDW: 14.1 % (ref 11.5–15.5)
WBC: 3.7 10*3/uL — ABNORMAL LOW (ref 4.0–10.5)

## 2023-03-06 LAB — IBC + FERRITIN
Ferritin: 54.6 ng/mL (ref 22.0–322.0)
Iron: 88 ug/dL (ref 42–165)
Saturation Ratios: 22.4 % (ref 20.0–50.0)
TIBC: 392 ug/dL (ref 250.0–450.0)
Transferrin: 280 mg/dL (ref 212.0–360.0)

## 2023-03-06 MED ORDER — PANTOPRAZOLE SODIUM 40 MG PO TBEC: 40.0000 mg | DELAYED_RELEASE_TABLET | Freq: Two times a day (BID) | ORAL | 2 refills | Status: DC

## 2023-03-06 MED ORDER — NA SULFATE-K SULFATE-MG SULF 17.5-3.13-1.6 GM/177ML PO SOLN: 1.0000 | Freq: Once | ORAL | 0 refills | Status: AC

## 2023-03-06 NOTE — Telephone Encounter (Signed)
Contacted pt & pt is aware to hold his oral iron starting on 6/21.

## 2023-03-06 NOTE — Patient Instructions (Addendum)
You have been scheduled for an endoscopy and colonoscopy. Please follow the written instructions given to you at your visit today. Please pick up your prep supplies at the pharmacy within the next 1-3 days. If you use inhalers (even only as needed), please bring them with you on the [place] of your procedure.  Your provider has requested that you go to the basement level for lab work before leaving today. Press "B" on the elevator. The lab is located at the first door on the left as you exit the elevator.  Due to recent changes in healthcare laws, you may see the results of your imaging and laboratory studies on MyChart before your provider has had a chance to review them.  We understand that in some cases there may be results that are confusing or concerning to you. Not all laboratory results come back in the same time frame and the provider may be waiting for multiple results in order to interpret others.  Please give us 48 hours in order for your provider to thoroughly review all the results before contacting the office for clarification of your results.   Thank you for trusting me with your gastrointestinal care!   Colleen Kennedy-Smith, CRNP   

## 2023-03-06 NOTE — Progress Notes (Signed)
Agree with assessment and plan as outlined.  

## 2023-03-25 ENCOUNTER — Telehealth: Payer: Self-pay | Admitting: Nurse Practitioner

## 2023-03-25 NOTE — Telephone Encounter (Signed)
Inbound call from patient wife requesting a call back from a nurse.Ms Santalucia has some concerns regarding procedure on 6/28 , Please advise

## 2023-03-25 NOTE — Telephone Encounter (Signed)
Pt stated that he had questions about his sugar and what to drink the [place] prior to his procedure. Pt was notified to drink liquids with sugar in them as he was worried about his sugar dropping. Pt verbalized understanding with all questions answered.

## 2023-03-27 ENCOUNTER — Encounter: Payer: Self-pay | Admitting: Gastroenterology

## 2023-03-27 ENCOUNTER — Encounter: Payer: Medicare HMO | Admitting: Gastroenterology

## 2023-03-27 ENCOUNTER — Ambulatory Visit (AMBULATORY_SURGERY_CENTER): Payer: Medicare HMO | Admitting: Gastroenterology

## 2023-03-27 VITALS — BP 127/77 | HR 60 | Temp 98.0°F | Resp 13 | Ht 67.75 in | Wt 191.0 lb

## 2023-03-27 DIAGNOSIS — K635 Polyp of colon: Secondary | ICD-10-CM

## 2023-03-27 DIAGNOSIS — D509 Iron deficiency anemia, unspecified: Secondary | ICD-10-CM | POA: Diagnosis not present

## 2023-03-27 DIAGNOSIS — D123 Benign neoplasm of transverse colon: Secondary | ICD-10-CM | POA: Diagnosis not present

## 2023-03-27 DIAGNOSIS — K319 Disease of stomach and duodenum, unspecified: Secondary | ICD-10-CM | POA: Diagnosis not present

## 2023-03-27 DIAGNOSIS — K2981 Duodenitis with bleeding: Secondary | ICD-10-CM | POA: Diagnosis not present

## 2023-03-27 DIAGNOSIS — Z09 Encounter for follow-up examination after completed treatment for conditions other than malignant neoplasm: Secondary | ICD-10-CM | POA: Diagnosis not present

## 2023-03-27 DIAGNOSIS — K6389 Other specified diseases of intestine: Secondary | ICD-10-CM | POA: Diagnosis not present

## 2023-03-27 DIAGNOSIS — D125 Benign neoplasm of sigmoid colon: Secondary | ICD-10-CM | POA: Diagnosis not present

## 2023-03-27 DIAGNOSIS — Z86010 Personal history of colonic polyps: Secondary | ICD-10-CM

## 2023-03-27 DIAGNOSIS — K2980 Duodenitis without bleeding: Secondary | ICD-10-CM | POA: Diagnosis not present

## 2023-03-27 DIAGNOSIS — D508 Other iron deficiency anemias: Secondary | ICD-10-CM

## 2023-03-27 DIAGNOSIS — K219 Gastro-esophageal reflux disease without esophagitis: Secondary | ICD-10-CM

## 2023-03-27 MED ORDER — SODIUM CHLORIDE 0.9 % IV SOLN: 500.0000 mL | INTRAVENOUS | Status: DC

## 2023-03-27 NOTE — Patient Instructions (Signed)
Discharge instructions given. Handouts on polyps,diverticulosis and hemorrhoids and hiatal hernia. Resume previous medications. YOU HAD AN ENDOSCOPIC PROCEDURE TODAY AT THE [facility]:   Refer to the procedure report that was given to you for any specific questions about what was found during the examination.  If the procedure report does not answer your questions, please call your gastroenterologist to clarify.  If you requested that your care partner not be given the details of your procedure findings, then the procedure report has been included in a sealed envelope for you to review at your convenience later.  YOU SHOULD EXPECT: Some feelings of bloating in the abdomen. Passage of more gas than usual.  Walking can help get rid of the air that was put into your GI tract during the procedure and reduce the bloating. If you had a lower endoscopy (such as a colonoscopy or flexible sigmoidoscopy) you may notice spotting of blood in your stool or on the toilet paper. If you underwent a bowel prep for your procedure, you may not have a normal bowel movement for a few days.  Please Note:  You might notice some irritation and congestion in your nose or some drainage.  This is from the oxygen used during your procedure.  There is no need for concern and it should clear up in a Kucinski or so.  SYMPTOMS TO REPORT IMMEDIATELY:  Following lower endoscopy (colonoscopy or flexible sigmoidoscopy):  Excessive amounts of blood in the stool  Significant tenderness or worsening of abdominal pains  Swelling of the abdomen that is new, acute  Fever of 100F or higher  Following upper endoscopy (EGD)  Vomiting of blood or coffee ground material  New chest pain or pain under the shoulder blades  Painful or persistently difficult swallowing  New shortness of breath  Fever of 100F or higher  Black, tarry-looking stools  For urgent or emergent issues, a gastroenterologist can be reached at any hour by  calling [phone]. Do not use MyChart messaging for urgent concerns.    DIET:  We do recommend a small meal at first, but then you may proceed to your regular diet.  Drink plenty of fluids but you should avoid alcoholic beverages for 24 hours.  ACTIVITY:  You should plan to take it easy for the rest of today and you should NOT DRIVE or use heavy machinery until tomorrow (because of the sedation medicines used during the test).    FOLLOW UP: Our staff will call the number listed on your records the next business [place] following your procedure.  We will call around 7:15- 8:00 am to check on you and address any questions or concerns that you may have regarding the information given to you following your procedure. If we do not reach you, we will leave a message.     If any biopsies were taken you will be contacted by phone or by letter within the next 1-3 weeks.  Please call us at [phone] if you have not heard about the biopsies in 3 weeks.    SIGNATURES/CONFIDENTIALITY: You and/or your care partner have signed paperwork which will be entered into your electronic medical record.  These signatures attest to the fact that that the information above on your After Visit Summary has been reviewed and is understood.  Full responsibility of the confidentiality of this discharge information lies with you and/or your care-partner.

## 2023-03-27 NOTE — Progress Notes (Signed)
Pt's states no medical or surgical changes since previsit or office visit. 

## 2023-03-27 NOTE — Progress Notes (Signed)
History and Physical Interval Note: seen in the office 03/06/23 - history of IDA, GERD requiring high dose protonix to control symptoms, and history of colon polyps - 6 adenomas removed on last exam 01/2020. NO interval changes since office visit he wishes to proceed with the exams as outlined. Otherwise denies complaints.    03/27/2023 2:56 PM  Jerome Vaughn  has presented today for endoscopic procedure(s), with the diagnosis of  Encounter Diagnoses  Name Primary?   Other iron deficiency anemia Yes   Gastroesophageal reflux disease, unspecified whether esophagitis present    Polyp of colon, unspecified part of colon, unspecified type   .  The various methods of evaluation and treatment have been discussed with the patient and/or family. After consideration of risks, benefits and other options for treatment, the patient has consented to  the endoscopic procedure(s).   The patient's history has been reviewed, patient examined, no change in status, stable for surgery.  I have reviewed the patient's chart and labs.  Questions were answered to the patient's satisfaction.    Harlin Rain, MD [facility]

## 2023-03-27 NOTE — Progress Notes (Signed)
Sedate, gd SR, tolerated procedure well, VSS, report to RN 

## 2023-03-27 NOTE — Op Note (Signed)
[facility] Patient Name: Jerome Vaughn Procedure Date: 03/27/2023 2:58 PM MRN: [id number] Endoscopist: Viviann Spare P. Adela Lank , MD, [id number] Age: 72 Referring MD:  Date of Birth: 29-Jul-1951 Gender: Male Account #: [id number] Procedure:                Upper GI endoscopy Indications:              Iron deficiency anemia, Follow-up of                            gastro-esophageal reflux disease Medicines:                Monitored Anesthesia Care Procedure:                Pre-Anesthesia Assessment:                           - Prior to the procedure, a History and Physical                            was performed, and patient medications and                            allergies were reviewed. The patient's tolerance of                            previous anesthesia was also reviewed. The risks                            and benefits of the procedure and the sedation                            options and risks were discussed with the patient.                            All questions were answered, and informed consent                            was obtained. Prior Anticoagulants: The patient has                            taken no anticoagulant or antiplatelet agents. ASA                            Grade Assessment: III - A patient with severe                            systemic disease. After reviewing the risks and                            benefits, the patient was deemed in satisfactory                            condition to undergo the procedure.  After obtaining informed consent, the endoscope was                            passed under direct vision. Throughout the                            procedure, the patient's blood pressure, pulse, and                            oxygen saturations were monitored continuously. The                            GIF [id number] #[id number] was introduced through the                            mouth, and advanced to the second  part of duodenum.                            The upper GI endoscopy was accomplished without                            difficulty. The patient tolerated the procedure                            well. Scope In: Scope Out: Findings:                 Esophagogastric landmarks were identified: the                            Z-line was found at 39 cm, the gastroesophageal                            junction was found at 39 cm and the upper extent of                            the gastric folds was found at 40 cm from the                            incisors. Z line slightly irregular but did not                            meet criteria for Barrett's.                           A 1 cm hiatal hernia was present.                           The exam of the esophagus was otherwise normal.                           Localized erythematous mucosa was found in the  gastric antrum. Biopsies were taken with a cold                            forceps for histology.                           The exam of the stomach was otherwise normal.                           Biopsies were taken with a cold forceps for                            Helicobacter pylori testing.                           A single 10 mm nodule with superficial erythema was                            found in the distal bulb / enterance of duodenal                            sweep. Biopsies were taken with a cold forceps for                            histology, rule out adenoma.                           The exam of the duodenum was otherwise normal. Complications:            No immediate complications. Estimated blood loss:                            Minimal. Estimated Blood Loss:     Estimated blood loss was minimal. Impression:               - Esophagogastric landmarks identified.                           - 1 cm hiatal hernia.                           - Slightly irregular z line which did not meet                             criteria for Barrett's.                           - Normal esophagus otherwise.                           - Erythematous mucosa in the antrum. Biopsied.                           - Normal stomach otherwise - biopsies taken to rule  out H pylori                           - Nodule found in the duodenum. Biopsied.                           - Normal duodenum otherwise. Recommendation:           - Patient has a contact number available for                            emergencies. The signs and symptoms of potential                            delayed complications were discussed with the                            patient. Return to normal activities tomorrow.                            Written discharge instructions were provided to the                            patient.                           - Resume previous diet.                           - Continue present medications.                           - Await pathology results. Viviann Spare P. Adiel Erney, MD 03/27/2023 3:57:36 PM This report has been signed electronically.

## 2023-03-27 NOTE — Progress Notes (Signed)
Called to room to assist during endoscopic procedure.  Patient ID and intended procedure confirmed with present staff. Received instructions for my participation in the procedure from the performing physician.  

## 2023-03-27 NOTE — Op Note (Signed)
[facility] Patient Name: Jerome Vaughn Procedure Date: 03/27/2023 2:56 PM MRN: [id number] Endoscopist: Viviann Spare P. Adela Lank , MD, [id number] Age: 72 Referring MD:  Date of Birth: Oct 03, 1950 Gender: Male Account #: [id number] Procedure:                Colonoscopy Indications:              High risk colon cancer surveillance: Personal                            history of colonic polyps - 6 adenomas removed                            01/2020 (Dr. Orvan Falconer), history of iron deficiency Medicines:                Monitored Anesthesia Care Procedure:                Pre-Anesthesia Assessment:                           - Prior to the procedure, a History and Physical                            was performed, and patient medications and                            allergies were reviewed. The patient's tolerance of                            previous anesthesia was also reviewed. The risks                            and benefits of the procedure and the sedation                            options and risks were discussed with the patient.                            All questions were answered, and informed consent                            was obtained. Prior Anticoagulants: The patient has                            taken no anticoagulant or antiplatelet agents. ASA                            Grade Assessment: III - A patient with severe                            systemic disease. After reviewing the risks and                            benefits, the patient was deemed in satisfactory  condition to undergo the procedure.                           After obtaining informed consent, the colonoscope                            was passed under direct vision. Throughout the                            procedure, the patient's blood pressure, pulse, and                            oxygen saturations were monitored continuously. The                            CF [id number]  #[id number] was introduced through the anus                            and advanced to the the terminal ileum, with                            identification of the appendiceal orifice and IC                            valve. The colonoscopy was performed without                            difficulty. The patient tolerated the procedure                            well. The quality of the bowel preparation was                            adequate. The terminal ileum, ileocecal valve,                            appendiceal orifice, and rectum were photographed. Scope In: 3:17:52 PM Scope Out: 3:42:32 PM Scope Withdrawal Time: 0 hours 21 minutes 0 seconds  Total Procedure Duration: 0 hours 24 minutes 40 seconds  Findings:                 The perianal and digital rectal examinations were                            normal.                           The terminal ileum appeared normal.                           An area of altered mucosa was found at the                            ileocecal valve. It was erythematous / hard /  fibrotic, with some narrowing at the entrance of                            the IC valve which was difficult to intubate.                            Biopsies were taken with a cold forceps for                            histology.                           Multiple small-mouthed diverticula were found in                            the transverse colon and right colon.                           Two sessile polyps were found in the transverse                            colon. The polyps were 3 to 4 mm in size. These                            polyps were removed with a cold snare. Resection                            and retrieval were complete.                           A 3 mm polyp was found in the sigmoid colon. The                            polyp was sessile. The polyp was removed with a                            cold snare. Resection and  retrieval were complete.                           The colon was long and redundant.                           Internal hemorrhoids were found during                            retroflexion. The hemorrhoids were large.                           The exam was otherwise without abnormality. Complications:            No immediate complications. Estimated blood loss:                            Minimal. Estimated Blood Loss:     Estimated blood loss  was minimal. Impression:               - The examined portion of the ileum was normal.                           - Altered mucosa at the ileocecal valve - appears                            post inflammatory / scarred? Biopsied.                           - Diverticulosis in the transverse colon and in the                            right colon.                           - Two 3 to 4 mm polyps in the transverse colon,                            removed with a cold snare. Resected and retrieved.                           - One 3 mm polyp in the sigmoid colon, removed with                            a cold snare. Resected and retrieved.                           - Redundant colon.                           - Internal hemorrhoids.                           - The examination was otherwise normal.                           No obvious cause for iron deficiency on colonoscopy. Recommendation:           - Patient has a contact number available for                            emergencies. The signs and symptoms of potential                            delayed complications were discussed with the                            patient. Return to normal activities tomorrow.                            Written discharge instructions were provided to the  patient.                           - Resume previous diet.                           - Continue present medications.                           - Await pathology results. Viviann Spare P.  Lynkin Saini, MD 03/27/2023 3:50:12 PM This report has been signed electronically.

## 2023-03-30 ENCOUNTER — Telehealth: Payer: Self-pay | Admitting: *Deleted

## 2023-03-30 NOTE — Telephone Encounter (Signed)
  Follow up Call-     03/27/2023    2:14 PM  Call back number  Post procedure Call Back phone  # [phone]  Permission to leave phone message Yes     Patient questions:  Do you have a fever, pain , or abdominal swelling? No. Pain Score  0 *  Have you tolerated food without any problems? Yes.    Have you been able to return to your normal activities? Yes.    Do you have any questions about your discharge instructions: Diet   No. Medications  No. Follow up visit  No.  Do you have questions or concerns about your Care? No.  Actions: * If pain score is 4 or above: No action needed, pain <4.

## 2023-04-09 ENCOUNTER — Other Ambulatory Visit: Payer: Self-pay | Admitting: Hematology and Oncology

## 2023-04-09 DIAGNOSIS — D472 Monoclonal gammopathy: Secondary | ICD-10-CM

## 2023-04-10 ENCOUNTER — Inpatient Hospital Stay: Payer: Medicare HMO | Attending: Hematology and Oncology

## 2023-04-10 ENCOUNTER — Other Ambulatory Visit: Payer: Self-pay

## 2023-04-10 DIAGNOSIS — D472 Monoclonal gammopathy: Secondary | ICD-10-CM | POA: Diagnosis not present

## 2023-04-10 LAB — CMP (CANCER CENTER ONLY)
ALT: 23 U/L (ref 0–44)
AST: 20 U/L (ref 15–41)
Albumin: 4.2 g/dL (ref 3.5–5.0)
Alkaline Phosphatase: 23 U/L — ABNORMAL LOW (ref 38–126)
Anion gap: 8 (ref 5–15)
BUN: 21 mg/dL (ref 8–23)
CO2: 21 mmol/L — ABNORMAL LOW (ref 22–32)
Calcium: 9.1 mg/dL (ref 8.9–10.3)
Chloride: 106 mmol/L (ref 98–111)
Creatinine: 1.13 mg/dL (ref 0.61–1.24)
GFR, Estimated: >60 mL/min (ref >60)
Glucose, Bld: 307 mg/dL — ABNORMAL HIGH (ref 70–99)
Potassium: 4.5 mmol/L (ref 3.5–5.1)
Sodium: 135 mmol/L (ref 135–145)
Total Bilirubin: 0.4 mg/dL (ref 0.3–1.2)
Total Protein: 7.7 g/dL (ref 6.5–8.1)

## 2023-04-10 LAB — CBC WITH DIFFERENTIAL (CANCER CENTER ONLY)
Abs Immature Granulocytes: 0.01 10*3/uL (ref 0.00–0.07)
Basophils Absolute: 0 10*3/uL (ref 0.0–0.1)
Basophils Relative: 0 %
Eosinophils Absolute: 0.1 10*3/uL (ref 0.0–0.5)
Eosinophils Relative: 2 %
HCT: 37.9 % — ABNORMAL LOW (ref 39.0–52.0)
Hemoglobin: 12.9 g/dL — ABNORMAL LOW (ref 13.0–17.0)
Immature Granulocytes: 0 %
Lymphocytes Relative: 29 %
Lymphs Abs: 0.9 10*3/uL (ref 0.7–4.0)
MCH: 31.2 pg (ref 26.0–34.0)
MCHC: 34 g/dL (ref 30.0–36.0)
MCV: 91.5 fL (ref 80.0–100.0)
Monocytes Absolute: 0.3 10*3/uL (ref 0.1–1.0)
Monocytes Relative: 8 %
Neutro Abs: 1.9 10*3/uL (ref 1.7–7.7)
Neutrophils Relative %: 61 %
Platelet Count: 114 10*3/uL — ABNORMAL LOW (ref 150–400)
RBC: 4.14 MIL/uL — ABNORMAL LOW (ref 4.22–5.81)
RDW: 12.5 % (ref 11.5–15.5)
WBC Count: 3.2 10*3/uL — ABNORMAL LOW (ref 4.0–10.5)
nRBC: 0 % (ref 0.0–0.2)

## 2023-04-10 LAB — LACTATE DEHYDROGENASE: LDH: 163 U/L (ref 98–192)

## 2023-04-13 LAB — KAPPA/LAMBDA LIGHT CHAINS
Kappa free light chain: 20.1 mg/L — ABNORMAL HIGH (ref 3.3–19.4)
Kappa, lambda light chain ratio: 0.85 (ref 0.26–1.65)
Lambda free light chains: 23.6 mg/L (ref 5.7–26.3)

## 2023-04-15 LAB — MULTIPLE MYELOMA PANEL, SERUM
Albumin SerPl Elph-Mcnc: 3.8 g/dL (ref 2.9–4.4)
Albumin/Glob SerPl: 1.2 (ref 0.7–1.7)
Alpha 1: 0.1 g/dL (ref 0.0–0.4)
Alpha2 Glob SerPl Elph-Mcnc: 0.7 g/dL (ref 0.4–1.0)
B-Globulin SerPl Elph-Mcnc: 0.8 g/dL (ref 0.7–1.3)
Gamma Glob SerPl Elph-Mcnc: 1.6 g/dL (ref 0.4–1.8)
Globulin, Total: 3.3 g/dL (ref 2.2–3.9)
IgA: 86 mg/dL (ref 61–437)
IgG (Immunoglobin G), Serum: 1864 mg/dL — ABNORMAL HIGH (ref 603–1613)
IgM (Immunoglobulin M), Srm: 20 mg/dL (ref 20–172)
M Protein SerPl Elph-Mcnc: 1.1 g/dL — ABNORMAL HIGH
Total Protein ELP: 7.1 g/dL (ref 6.0–8.5)

## 2023-04-17 ENCOUNTER — Inpatient Hospital Stay: Payer: Medicare HMO | Admitting: Hematology and Oncology

## 2023-04-17 ENCOUNTER — Telehealth: Payer: Self-pay

## 2023-04-17 ENCOUNTER — Telehealth: Payer: Self-pay | Admitting: Hematology and Oncology

## 2023-04-17 NOTE — Telephone Encounter (Signed)
TC to pt to notify of need to cancel and reschedule today's appointment w/ Dr. Leonides Schanz d/t worldwide cyber issue taking place, impacting [facility]'s computer system. Pt verbalizes understanding--scheduling to call pt to reschedule.

## 2023-04-18 DIAGNOSIS — E119 Type 2 diabetes mellitus without complications: Secondary | ICD-10-CM | POA: Diagnosis not present

## 2023-04-20 ENCOUNTER — Telehealth: Payer: Self-pay

## 2023-04-20 ENCOUNTER — Ambulatory Visit: Payer: Medicare HMO | Admitting: Hematology and Oncology

## 2023-04-20 NOTE — Telephone Encounter (Signed)
Went over [employer] with patient

## 2023-04-24 ENCOUNTER — Ambulatory Visit (INDEPENDENT_AMBULATORY_CARE_PROVIDER_SITE_OTHER): Payer: Medicare HMO | Admitting: Podiatry

## 2023-04-24 DIAGNOSIS — L539 Erythematous condition, unspecified: Secondary | ICD-10-CM | POA: Diagnosis not present

## 2023-04-24 DIAGNOSIS — L603 Nail dystrophy: Secondary | ICD-10-CM | POA: Diagnosis not present

## 2023-04-24 MED ORDER — DOXYCYCLINE HYCLATE 100 MG PO TABS: 100.0000 mg | ORAL_TABLET | Freq: Two times a day (BID) | ORAL | 0 refills | Status: DC

## 2023-04-24 NOTE — Progress Notes (Signed)
Subjective:  Patient ID: Jerome Vaughn, male    DOB: 04-Sep-1951,  MRN: [id number]  Chief Complaint  Patient presents with   Diabetes    Diabetic foot care nail trim     72 y.o. male presents with the above complaint.  Patient presents with left fifth digit nail dystrophy with underlying erythema.  Patient states pancreatitis causes him worse.  Is not taking any antibiotics he like to have removed.  He denies any labs prior to seeing me he is a diabetic with last A1c of 6.6.   Review of Systems: Negative except as noted in the HPI. Denies N/V/F/Ch.  Past Medical History:  Diagnosis Date   Carpal tunnel syndrome    both hands   Colon polyps    Coronary artery disease    sees Dr. Royann Shivers   Diabetes mellitus    sees Dr. Lesly Dukes   GERD (gastroesophageal reflux disease)    History of echocardiogram    a. Echo 4/17: Moderate LVH, EF 50-55%, mild LAE, no pericardial effusion   Hyperlipidemia    Hypertension    MGUS (monoclonal gammopathy of unknown significance)    sees Dr. Jeanie Sewer   Neuropathy    gets foot exams with Dr. Gala Lewandowsky (podiatry)   Pneumonia    Rotator cuff injury     Current Outpatient Medications:    doxycycline (VIBRA-TABS) 100 MG tablet, Take 1 tablet (100 mg total) by mouth 2 (two) times daily., Disp: 20 tablet, Rfl: 0   Accu-Chek Softclix Lancets lancets, Use to monitor glucose levels 1X daily; E11.8, Disp: 100 each, Rfl: 3   acetaminophen (TYLENOL) 500 MG tablet, Take 500 mg by mouth as needed., Disp: , Rfl:    APPLE CIDER VINEGAR PO, Take by mouth daily. , Disp: , Rfl:    Ascorbic Acid (VITAMIN C PO), Take 1 tablet by mouth daily., Disp: , Rfl:    aspirin EC 81 MG tablet, Take 1 tablet (81 mg total) by mouth daily., Disp: , Rfl:    benazepril (LOTENSIN) 20 MG tablet, Take 1 tablet (20 mg total) by mouth daily., Disp: 90 tablet, Rfl: 3   Blood Glucose Monitoring Suppl (ACCU-CHEK GUIDE) w/Device KIT, USE AS DIRECTED, Disp: 1 kit, Rfl: 3    carvedilol (COREG) 3.125 MG tablet, Take 1 tablet (3.125 mg total) by mouth 2 (two) times daily with a meal., Disp: 180 tablet, Rfl: 3   Cinnamon 500 MG capsule, Take 1 capsule by mouth daily., Disp: , Rfl:    Coenzyme Q10 (COQ-10) 100 MG CAPS, Take 1 tablet by mouth daily., Disp: , Rfl:    Cyanocobalamin (VITAMIN B-12 PO), Take 1 tablet by mouth daily., Disp: , Rfl:    ezetimibe (ZETIA) 10 MG tablet, Take 1 tablet (10 mg total) by mouth daily., Disp: 90 tablet, Rfl: 2   ferrous sulfate 325 (65 FE) MG tablet, Take 325 mg by mouth daily with breakfast., Disp: , Rfl:    fluticasone (FLONASE) 50 MCG/ACT nasal spray, Place 2 sprays into both nostrils daily., Disp: 16 g, Rfl: 0   Garlic 1000 MG CAPS, Take 1 tablet by mouth daily., Disp: , Rfl:    glucose blood (ACCU-CHEK GUIDE) test strip, Use to monitor glucose levels 1X daily; E11.8, Disp: 100 each, Rfl: 3   meloxicam (MOBIC) 15 MG tablet, Take 15 mg by mouth daily., Disp: , Rfl:    metFORMIN (GLUCOPHAGE-XR) 500 MG 24 hr tablet, Take 4 tablets (2,000 mg total) by mouth daily., Disp: 360 tablet,  Rfl: 3   Multiple Vitamin (MULTIVITAMIN) tablet, Take 1 tablet by mouth daily., Disp: , Rfl:    nitroGLYCERIN (NITROSTAT) 0.4 MG SL tablet, Place 1 tablet (0.4 mg total) under the tongue every 5 (five) minutes as needed for chest pain., Disp: 75 tablet, Rfl: 3   Omega-3 Fatty Acids (FISH OIL PO), Take 1 capsule by mouth daily., Disp: , Rfl:    pantoprazole (PROTONIX) 40 MG tablet, Take 1 tablet (40 mg total) by mouth 2 (two) times daily., Disp: 180 tablet, Rfl: 2   ranolazine (RANEXA) 500 MG 12 hr tablet, Take 1 tablet (500 mg total) by mouth 2 (two) times daily., Disp: 180 tablet, Rfl: 1   repaglinide (PRANDIN) 1 MG tablet, Take 1 tablet (1 mg total) by mouth 2 (two) times daily before a meal., Disp: 180 tablet, Rfl: 3   rosuvastatin (CRESTOR) 40 MG tablet, TAKE 1 TABLET EVERY Dobias, Disp: 90 tablet, Rfl: 1   Semaglutide,0.25 or 0.5MG /DOS, 2 MG/3ML SOPN, Inject  0.5 mg into the skin once a week. (Patient taking differently: Inject 0.25 mg into the skin once a week.), Disp: 9 mL, Rfl: 3   sildenafil (VIAGRA) 100 MG tablet, Take 1 tablet (100 mg total) by mouth as needed for erectile dysfunction., Disp: 30 tablet, Rfl: 5   tadalafil (CIALIS) 20 MG tablet, TAKE 1 TABLET BY MOUTH AS NEEDED FOR ERECTILE DYSFUNCTION, Disp: 30 tablet, Rfl: 5   VITAMIN E PO, Take 1 capsule by mouth daily., Disp: , Rfl:   Social History   Tobacco Use  Smoking Status Former   Current packs/[place]: 0.00   Average packs/[place]: 0.3 packs/[place] for 20.0 years (5.0 ttl pk-yrs)   Types: Cigarettes   Start date: 10/07/1993   Quit date: 10/07/2013   Years since quitting: 9.5  Smokeless Tobacco Never  Tobacco Comments   quit cigarettes    No Known Allergies Objective:  There were no vitals filed for this visit. There is no height or weight on file to calculate BMI. Constitutional Well developed. Well nourished.  Vascular Dorsalis pedis pulses palpable bilaterally. Posterior tibial pulses palpable bilaterally. Capillary refill normal to all digits.  No cyanosis or clubbing noted. Pedal hair growth normal.  Neurologic Normal speech. Oriented to person, place, and time. Epicritic sensation to light touch grossly present bilaterally.  Dermatologic Pain on palpation of the entire/total nail on 5th digit of the left No other open wounds. No skin lesions.  Orthopedic: Normal joint ROM without pain or crepitus bilaterally. No visible deformities. No bony tenderness.   Radiographs: None Assessment:   1. Nail dystrophy   2. Erythema    Plan:  Patient was evaluated and treated and all questions answered.  Nail contusion/dystrophy fifth, left with underlying erythema -Patient elects to proceed with minor surgery to remove entire toenail today. Consent reviewed and signed by patient. -Entire/total nail excised. See procedure note. -Educated on post-procedure care including  soaking. Written instructions provided and reviewed. -Patient to follow up in 2 weeks for nail check. -Doxycycline was dispensed for skin and soft tissue prophylaxis/erythema  Procedure: Excision of entire/total nail  Location: Left 5th toe digit Anesthesia: Lidocaine 1% plain; 1.5 mL and Marcaine 0.5% plain; 1.5 mL, digital block. Skin Prep: Betadine. Dressing: Silvadene; telfa; dry, sterile, compression dressing. Technique: Following skin prep, the toe was exsanguinated and a tourniquet was secured at the base of the toe. The affected nail border was freed and excised. The tourniquet was then removed and sterile dressing applied. Disposition: Patient tolerated procedure  well. Patient to return in 2 weeks for follow-up.   No follow-ups on file.

## 2023-04-28 ENCOUNTER — Other Ambulatory Visit: Payer: Self-pay

## 2023-04-28 DIAGNOSIS — K219 Gastro-esophageal reflux disease without esophagitis: Secondary | ICD-10-CM

## 2023-04-28 MED ORDER — PANTOPRAZOLE SODIUM 40 MG PO TBEC
40.0000 mg | DELAYED_RELEASE_TABLET | Freq: Two times a day (BID) | ORAL | 2 refills | Status: DC
Start: 2023-04-28 — End: 2023-12-14

## 2023-04-28 NOTE — Telephone Encounter (Signed)
Pantoprazole 40mg  refilled as [facility] pharmacy requested. Patient up to date on visits.

## 2023-05-01 ENCOUNTER — Inpatient Hospital Stay: Payer: Medicare HMO | Attending: Hematology and Oncology | Admitting: Hematology and Oncology

## 2023-05-01 ENCOUNTER — Encounter: Payer: Self-pay | Admitting: Family Medicine

## 2023-05-01 ENCOUNTER — Telehealth: Payer: Self-pay

## 2023-05-01 ENCOUNTER — Other Ambulatory Visit: Payer: Self-pay

## 2023-05-01 ENCOUNTER — Ambulatory Visit: Payer: Medicare HMO | Admitting: Family Medicine

## 2023-05-01 VITALS — BP 116/70 | HR 96 | Temp 98.4°F | Ht 67.75 in | Wt 189.4 lb

## 2023-05-01 VITALS — BP 133/83 | HR 86 | Temp 97.9°F | Resp 16 | Wt 188.3 lb

## 2023-05-01 DIAGNOSIS — D696 Thrombocytopenia, unspecified: Secondary | ICD-10-CM | POA: Diagnosis not present

## 2023-05-01 DIAGNOSIS — D472 Monoclonal gammopathy: Secondary | ICD-10-CM | POA: Insufficient documentation

## 2023-05-01 DIAGNOSIS — M79604 Pain in right leg: Secondary | ICD-10-CM | POA: Diagnosis not present

## 2023-05-01 DIAGNOSIS — D500 Iron deficiency anemia secondary to blood loss (chronic): Secondary | ICD-10-CM

## 2023-05-01 DIAGNOSIS — D649 Anemia, unspecified: Secondary | ICD-10-CM | POA: Diagnosis not present

## 2023-05-01 DIAGNOSIS — M7989 Other specified soft tissue disorders: Secondary | ICD-10-CM | POA: Diagnosis not present

## 2023-05-01 DIAGNOSIS — R600 Localized edema: Secondary | ICD-10-CM | POA: Diagnosis not present

## 2023-05-01 DIAGNOSIS — I83891 Varicose veins of right lower extremities with other complications: Secondary | ICD-10-CM | POA: Diagnosis not present

## 2023-05-01 DIAGNOSIS — Z87891 Personal history of nicotine dependence: Secondary | ICD-10-CM | POA: Diagnosis not present

## 2023-05-01 NOTE — Progress Notes (Signed)
   Subjective:    Patient ID: Jerome Vaughn, male    DOB: 1951/09/22, 72 y.o.   MRN: [id number]  HPI Here for the sudden onset 6 days ago of swelling and pain in the right lower leg. No hx of blood clots. No chest pain or SOB. The pain is centered in the calf. No recent trauma. He takes ASA 81 mg daily.    Review of Systems  Constitutional: Negative.   Respiratory: Negative.    Cardiovascular:  Positive for leg swelling. Negative for chest pain and palpitations.       Objective:   Physical Exam Constitutional:      General: He is not in acute distress.    Appearance: Normal appearance.  Cardiovascular:     Rate and Rhythm: Normal rate and regular rhythm.     Pulses: Normal pulses.     Heart sounds: Normal heart sounds.  Pulmonary:     Effort: Pulmonary effort is normal.     Breath sounds: Normal breath sounds.  Musculoskeletal:     Comments: The right lower leg and ankle have 2+ edema. The right calf is tender, and a cord is palpable in the calf. Homan's is negative   Neurological:     Mental Status: He is alert.           Assessment & Plan:  Swelling in the right lower leg, likely a superficial thrombus. We will set up a stat venous doppler today to evaluate.  Gershon Crane, MD

## 2023-05-01 NOTE — Telephone Encounter (Signed)
Call report   Negative for DVT They are going to bring him back to do a full leg ultrasound due to possible varicose veins; but no signs of DVT.

## 2023-05-01 NOTE — Telephone Encounter (Signed)
Noted  

## 2023-05-01 NOTE — Progress Notes (Unsigned)
[facility] [phone]   [phone]  PROGRESS NOTE  Patient Care Team: Nelwyn Salisbury, MD as PCP - General (Family Medicine) Lyn Records, MD (Inactive) as PCP - Cardiology (Cardiology) Tressia Danas, MD as Consulting Physician (Gastroenterology) Romero Belling, MD (Inactive) as Consulting Physician (Endocrinology) Jaci Standard, MD as Consulting Physician (Hematology and Oncology) Verner Chol, [facility] (Inactive) as Pharmacist (Pharmacist)  Hematological/Oncological History # IgG Lambda MGUS    (a) SPEP 02/22/2020 shows an M spike of 1.2%, IFE confirms IgG lambda clonality             (b) kappa lambda ratio 09/26/2020 was normal at 0.93.             (c) 24 hr urine shows the total protein not to be elevated.  Free lambda light chains were 41 mg/L             (d) beta-2 microglobulin 09/26/2020 was normal at 1.8 04/11/2022: establish care with Dr. Leonides Schanz, transition care from Dr. Darnelle Catalan.   Interval History:  Jerome Vaughn 72 y.o. 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 The patient's last visit was on 10/17/2022. In the interim since the last visit he has had no major changes in his health.  On exam today Mr. Amano reports he has been pretty good in the interim since her last visit.  He reports his energy levels are strong and his appetite is good.  He is having some swelling in his right leg.  He went to the vein center underwent ultrasound which showed there was no clot but there is concern for incompetence of the veins.  He notes that he was told to elevate and wear compression sock.  He is also having some rotator cuff issues.  He recently underwent a colonoscopy and EGD and was found to have hemorrhoids but has not had any issues with that.  Otherwise he is at his baseline level of health and has no questions concerns or complaints today.  He  denies any new bone or back pain.  He notes that he is otherwise had no fevers, chills, sweats, nausea, vomiting or diarrhea.  A full 10 point ROS is listed below.  MEDICAL HISTORY:  Past Medical History:  Diagnosis Date   Carpal tunnel syndrome    both hands   Colon polyps    Coronary artery disease    sees Dr. Royann Shivers   Diabetes mellitus    sees Dr. Lesly Dukes   GERD (gastroesophageal reflux disease)    History of echocardiogram    a. Echo 4/17: Moderate LVH, EF 50-55%, mild LAE, no pericardial effusion   Hyperlipidemia    Hypertension    MGUS (monoclonal gammopathy of unknown significance)    sees Dr. Jeanie Sewer   Neuropathy    gets foot exams with Dr. Gala Lewandowsky (podiatry)   Pneumonia    Rotator cuff injury     SURGICAL HISTORY: Past Surgical History:  Procedure Laterality Date   CARDIAC CATHETERIZATION N/A 11/21/2015   Procedure: Left Heart Cath and Coronary Angiography;  Surgeon: Lyn Records, MD;  Location: [facility] INVASIVE CV LAB;  Service: Cardiovascular;  Laterality: N/A;   COLONOSCOPY  02/23/2020   per Dr. Orvan Falconer, tubular adenomas, repeat in 3 yrs    colonsocopy  12/07/2009   per Dr. Matthias Hughs, benign polyps, repeat in 10 yrs    CORONARY ARTERY  BYPASS GRAFT N/A 11/22/2015   Procedure: CORONARY ARTERY BYPASS GRAFTING (CABG) times five using the left internal mammary, right greater saphenous vein EVH, and left thigh greater saphenous vein EVH;  Surgeon: Kerin Perna, MD;  Location: [facility] OR;  Service: Open Heart Surgery;  Laterality: N/A;   CORONARY PRESSURE/FFR STUDY N/A 12/14/2018   Procedure: INTRAVASCULAR PRESSURE WIRE/FFR STUDY;  Surgeon: Corky Crafts, MD;  Location: [facility] INVASIVE CV LAB;  Service: Cardiovascular;  Laterality: N/A;   frozen shoulder release Right    LEFT HEART CATH AND CORS/GRAFTS ANGIOGRAPHY N/A 10/27/2017   Procedure: LEFT HEART CATH AND CORS/GRAFTS ANGIOGRAPHY;  Surgeon: Lyn Records, MD;  Location: [name];  Service:  Cardiovascular;  Laterality: N/A;   LEFT HEART CATH AND CORS/GRAFTS ANGIOGRAPHY N/A 12/14/2018   Procedure: LEFT HEART CATH AND CORS/GRAFTS ANGIOGRAPHY;  Surgeon: Corky Crafts, MD;  Location: [facility] INVASIVE CV LAB;  Service: Cardiovascular;  Laterality: N/A;   TEE WITHOUT CARDIOVERSION N/A 11/22/2015   Procedure: TRANSESOPHAGEAL ECHOCARDIOGRAM (TEE);  Surgeon: Kerin Perna, MD;  Location: [facility] OR;  Service: Open Heart Surgery;  Laterality: N/A;   WRIST GANGLION EXCISION Right     SOCIAL HISTORY: Social History   Socioeconomic History   Marital status: Married    Spouse name: Not on file   Number of children: 5   Years of education: Not on file   Highest education level: Associate degree: occupational, Scientist, product/process development, or vocational program  Occupational History   Occupation: Dispatcher  Tobacco Use   Smoking status: Former    Current packs/Schneller: 0.00    Average packs/Brook: 0.3 packs/Darr for 20.0 years (5.0 ttl pk-yrs)    Types: Cigarettes    Start date: 10/07/1993    Quit date: 10/07/2013    Years since quitting: 9.5   Smokeless tobacco: Never   Tobacco comments:    quit cigarettes  Vaping Use   Vaping status: Never Used  Substance and Sexual Activity   Alcohol use: Yes    Alcohol/week: 3.0 - 4.0 standard drinks of alcohol    Types: 3 - 4 Standard drinks or equivalent per week   Drug use: No   Sexual activity: Not Currently    Birth control/protection: None  Other Topics Concern   Not on file  Social History Narrative   Originally from [place], [place]   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)   Married   5 kids   6 grand, 2 great grand   Social Determinants of Health   Financial Resource Strain: Low Risk  (04/30/2023)   Overall Financial Resource Strain (CARDIA)    Difficulty of Paying Living Expenses: Not hard at all  Food Insecurity: No Food Insecurity (04/30/2023)   Hunger Vital Sign    Worried About Running Out of Food in the Last Year: Never  true    Ran Out of Food in the Last Year: Never true  Transportation Needs: No Transportation Needs (04/30/2023)   PRAPARE - Administrator, Civil Service (Medical): No    Lack of Transportation (Non-Medical): No  Physical Activity: Sufficiently Active (04/30/2023)   Exercise Vital Sign    Days of Exercise per Week: 7 days    Minutes of Exercise per Session: 150+ min  Stress: No Stress Concern Present (04/30/2023)   [employer] of Occupational Health - Occupational Stress Questionnaire    Feeling of Stress : Not at all  Social Connections: Socially Integrated (04/30/2023)   Social Connection and Isolation  Panel [NHANES]    Frequency of Communication with Friends and Family: More than three times a week    Frequency of Social Gatherings with Friends and Family: More than three times a week    Attends Religious Services: More than 4 times per year    Active Member of [employer] or Organizations: Yes    Attends Engineer, structural: More than 4 times per year    Marital Status: Married  Catering manager Violence: Not At Risk (12/12/2022)   Humiliation, Afraid, Rape, and Kick questionnaire    Fear of Current or Ex-Partner: No    Emotionally Abused: No    Physically Abused: No    Sexually Abused: No    FAMILY HISTORY: Family History  Problem Relation Age of Onset   Heart failure Father    Hyperlipidemia Sister    Diabetes Sister    Hypertension Sister    Kidney failure Sister    Hyperlipidemia Sister    Diabetes Sister    Colon polyps Sister    Kidney disease Sister    Anuerysm Brother        AAA   Other Brother        COVID 19   Diabetes Maternal Grandmother    Diabetes Son    Heart disease Son    Heart attack Neg Hx    Colon cancer Neg Hx    Stomach cancer Neg Hx    Esophageal cancer Neg Hx    Pancreatic cancer Neg Hx     ALLERGIES:  has No Known Allergies.  MEDICATIONS:  Current Outpatient Medications  Medication Sig Dispense Refill   Accu-Chek  Softclix Lancets lancets Use to monitor glucose levels 1X daily; E11.8 100 each 3   acetaminophen (TYLENOL) 500 MG tablet Take 500 mg by mouth as needed.     APPLE CIDER VINEGAR PO Take by mouth daily.      Ascorbic Acid (VITAMIN C PO) Take 1 tablet by mouth daily.     aspirin EC 81 MG tablet Take 1 tablet (81 mg total) by mouth daily.     benazepril (LOTENSIN) 20 MG tablet Take 1 tablet (20 mg total) by mouth daily. 90 tablet 3   Blood Glucose Monitoring Suppl (ACCU-CHEK GUIDE) w/Device KIT USE AS DIRECTED 1 kit 3   carvedilol (COREG) 3.125 MG tablet Take 1 tablet (3.125 mg total) by mouth 2 (two) times daily with a meal. 180 tablet 3   Cinnamon 500 MG capsule Take 1 capsule by mouth daily.     Coenzyme Q10 (COQ-10) 100 MG CAPS Take 1 tablet by mouth daily.     Cyanocobalamin (VITAMIN B-12 PO) Take 1 tablet by mouth daily.     doxycycline (VIBRA-TABS) 100 MG tablet Take 1 tablet (100 mg total) by mouth 2 (two) times daily. 20 tablet 0   ezetimibe (ZETIA) 10 MG tablet Take 1 tablet (10 mg total) by mouth daily. 90 tablet 2   ferrous sulfate 325 (65 FE) MG tablet Take 325 mg by mouth daily with breakfast.     fluticasone (FLONASE) 50 MCG/ACT nasal spray Place 2 sprays into both nostrils daily. 16 g 0   Garlic 1000 MG CAPS Take 1 tablet by mouth daily.     glucose blood (ACCU-CHEK GUIDE) test strip Use to monitor glucose levels 1X daily; E11.8 100 each 3   meloxicam (MOBIC) 15 MG tablet Take 15 mg by mouth daily.     metFORMIN (GLUCOPHAGE-XR) 500 MG 24 hr tablet Take 4  tablets (2,000 mg total) by mouth daily. 360 tablet 3   Multiple Vitamin (MULTIVITAMIN) tablet Take 1 tablet by mouth daily.     nitroGLYCERIN (NITROSTAT) 0.4 MG SL tablet Place 1 tablet (0.4 mg total) under the tongue every 5 (five) minutes as needed for chest pain. 75 tablet 3   Omega-3 Fatty Acids (FISH OIL PO) Take 1 capsule by mouth daily.     pantoprazole (PROTONIX) 40 MG tablet Take 1 tablet (40 mg total) by mouth 2 (two)  times daily. 180 tablet 2   ranolazine (RANEXA) 500 MG 12 hr tablet Take 1 tablet (500 mg total) by mouth 2 (two) times daily. 180 tablet 1   repaglinide (PRANDIN) 1 MG tablet Take 1 tablet (1 mg total) by mouth 2 (two) times daily before a meal. 180 tablet 3   rosuvastatin (CRESTOR) 40 MG tablet TAKE 1 TABLET EVERY Councilman 90 tablet 1   Semaglutide,0.25 or 0.5MG /DOS, 2 MG/3ML SOPN Inject 0.5 mg into the skin once a week. (Patient taking differently: Inject 0.25 mg into the skin once a week.) 9 mL 3   sildenafil (VIAGRA) 100 MG tablet Take 1 tablet (100 mg total) by mouth as needed for erectile dysfunction. 30 tablet 5   tadalafil (CIALIS) 20 MG tablet TAKE 1 TABLET BY MOUTH AS NEEDED FOR ERECTILE DYSFUNCTION 30 tablet 5   VITAMIN E PO Take 1 capsule by mouth daily.     No current facility-administered medications for this visit.    REVIEW OF SYSTEMS:   Constitutional: ( - ) fevers, ( - )  chills , ( - ) night sweats Eyes: ( - ) blurriness of vision, ( - ) double vision, ( - ) watery eyes Ears, nose, mouth, throat, and face: ( - ) mucositis, ( - ) sore throat Respiratory: ( - ) cough, ( - ) dyspnea, ( - ) wheezes Cardiovascular: ( - ) palpitation, ( - ) chest discomfort, ( - ) lower extremity swelling Gastrointestinal:  ( - ) nausea, ( - ) heartburn, ( - ) change in bowel habits Skin: ( - ) abnormal skin rashes Lymphatics: ( - ) new lymphadenopathy, ( - ) easy bruising Neurological: ( - ) numbness, ( - ) tingling, ( - ) new weaknesses Behavioral/Psych: ( - ) mood change, ( - ) new changes  All other systems were reviewed with the patient and are negative.  PHYSICAL EXAMINATION: ECOG PERFORMANCE STATUS: 0 - Asymptomatic  Vitals:   05/01/23 1340  BP: 133/83  Pulse: 86  Resp: 16  Temp: 97.9 F (36.6 C)  SpO2: 100%    Filed Weights   05/01/23 1340  Weight: 188 lb 4.8 oz (85.4 kg)     GENERAL: Well-appearing elderly African-American male, alert, no distress and comfortable SKIN:  skin color, texture, turgor are normal, no rashes or significant lesions EYES: conjunctiva are pink and non-injected, sclera clear LUNGS: clear to auscultation and percussion with normal breathing effort HEART: regular rate & rhythm and no murmurs and no lower extremity edema Musculoskeletal: no cyanosis of digits and no clubbing  PSYCH: alert & oriented x 3, fluent speech NEURO: no focal motor/sensory deficits  LABORATORY DATA:  I have reviewed the data as listed    Latest Ref Rng & Units 04/10/2023    9:52 AM 03/06/2023   10:17 AM 10/17/2022   10:24 AM  CBC  WBC 4.0 - 10.5 K/uL 3.2  3.7  3.9   Hemoglobin 13.0 - 17.0 g/dL 75.6  43.3  12.3  Hematocrit 39.0 - 52.0 % 37.9  39.2  36.8   Platelets 150 - 400 K/uL 114  129.0  102        Latest Ref Rng & Units 04/10/2023    9:52 AM 03/06/2023   10:17 AM 10/17/2022   10:24 AM  CMP  Glucose 70 - 99 mg/dL 960  454  098   BUN 8 - 23 mg/dL 21  19  13    Creatinine 0.61 - 1.24 mg/dL 1.19  1.47  8.29   Sodium 135 - 145 mmol/L 135  137  137   Potassium 3.5 - 5.1 mmol/L 4.5  4.4  4.5   Chloride 98 - 111 mmol/L 106  105  106   CO2 22 - 32 mmol/L 21  23  24    Calcium 8.9 - 10.3 mg/dL 9.1  9.4  9.4   Total Protein 6.5 - 8.1 g/dL 7.7   7.7   Total Bilirubin 0.3 - 1.2 mg/dL 0.4   0.7   Alkaline Phos 38 - 126 U/L 23   31   AST 15 - 41 U/L 20   26   ALT 0 - 44 U/L 23   26     Lab Results  Component Value Date   MPROTEIN 1.1 (H) 04/10/2023   MPROTEIN 1.0 (H) 10/17/2022   MPROTEIN 1.1 (H) 07/11/2022   Lab Results  Component Value Date   KPAFRELGTCHN 20.1 (H) 04/10/2023   KPAFRELGTCHN 30.4 (H) 10/17/2022   KPAFRELGTCHN 22.7 (H) 07/11/2022   LAMBDASER 23.6 04/10/2023   LAMBDASER 27.4 (H) 10/17/2022   LAMBDASER 22.7 07/11/2022   KAPLAMBRATIO 0.85 04/10/2023   KAPLAMBRATIO 1.11 10/17/2022   KAPLAMBRATIO 1.00 07/11/2022   RADIOGRAPHIC STUDIES: No results found.  ASSESSMENT & PLAN Jerome Vaughn 72 y.o. 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   #IgG Lambda Monoclonal Gammopathy of Undetermined Significance --today will order an SPEP, SFLC and beta 2 microglobulin --additionally will collect new baseline CBC, CMP, and LDH --recommend a metastatic bone survey to be ordered today -- labs today show WBC 3.2, Hgb 12.9, MCV 91.5, Plt 114 --will consider the need for a bone marrow biopsy pending the above results --RTC in 6 months time or sooner if needed based on labs.   #Thrombocytopenia/Anemia -- stable from prior.  Hemoglobin today 12.9 with platelets of 114 --if iron levels are low will proceed with IV iron therapy.    Orders Placed This Encounter  Procedures   DG Bone Survey Met    Standing Status:   Future    Standing Expiration Date:   04/30/2024    Order Specific Question:   Reason for Exam (SYMPTOM  OR DIAGNOSIS REQUIRED)    Answer:   MGUS, assess for lytic lesions    Order Specific Question:   Preferred imaging location?    Answer:   [facility]    All questions were answered. The patient knows to call the clinic with any problems, questions or concerns.  A total of more than 30 minutes were spent on this encounter with face-to-face time and non-face-to-face time, including preparing to see the patient, ordering tests and/or medications, counseling the patient and coordination of care as outlined above.   Ulysees Barns, MD Department of Hematology/Oncology [facility] Phone: [phone] Pager: [phone] Email: [email or web address]  05/03/2023 8:25 PM

## 2023-05-03 ENCOUNTER — Encounter: Payer: Self-pay | Admitting: Hematology and Oncology

## 2023-05-03 ENCOUNTER — Encounter: Payer: Self-pay | Admitting: Oncology

## 2023-05-20 ENCOUNTER — Other Ambulatory Visit: Payer: Self-pay | Admitting: Family Medicine

## 2023-05-22 ENCOUNTER — Ambulatory Visit (HOSPITAL_COMMUNITY)
Admission: RE | Admit: 2023-05-22 | Discharge: 2023-05-22 | Disposition: A | Discharge status: Home / Self Care | Payer: Medicare HMO | Source: Ambulatory Visit | Attending: Hematology and Oncology | Admitting: Hematology and Oncology

## 2023-05-22 DIAGNOSIS — D472 Monoclonal gammopathy: Secondary | ICD-10-CM | POA: Diagnosis not present

## 2023-05-29 DIAGNOSIS — I872 Venous insufficiency (chronic) (peripheral): Secondary | ICD-10-CM | POA: Diagnosis not present

## 2023-05-29 DIAGNOSIS — M79661 Pain in right lower leg: Secondary | ICD-10-CM | POA: Diagnosis not present

## 2023-05-29 DIAGNOSIS — I83893 Varicose veins of bilateral lower extremities with other complications: Secondary | ICD-10-CM | POA: Diagnosis not present

## 2023-05-29 DIAGNOSIS — M79662 Pain in left lower leg: Secondary | ICD-10-CM | POA: Diagnosis not present

## 2023-05-29 DIAGNOSIS — R600 Localized edema: Secondary | ICD-10-CM | POA: Diagnosis not present

## 2023-06-03 ENCOUNTER — Other Ambulatory Visit: Payer: Self-pay

## 2023-06-03 MED ORDER — RANOLAZINE ER 500 MG PO TB12: 500.0000 mg | ORAL_TABLET | Freq: Two times a day (BID) | ORAL | 0 refills | Status: DC

## 2023-06-05 DIAGNOSIS — M67911 Unspecified disorder of synovium and tendon, right shoulder: Secondary | ICD-10-CM | POA: Diagnosis not present

## 2023-06-19 DIAGNOSIS — I83892 Varicose veins of left lower extremities with other complications: Secondary | ICD-10-CM | POA: Diagnosis not present

## 2023-06-25 ENCOUNTER — Other Ambulatory Visit: Payer: Self-pay

## 2023-06-25 DIAGNOSIS — E118 Type 2 diabetes mellitus with unspecified complications: Secondary | ICD-10-CM

## 2023-06-25 MED ORDER — ACCU-CHEK GUIDE VI STRP: ORAL_STRIP | 3 refills | Status: DC

## 2023-06-26 DIAGNOSIS — Z09 Encounter for follow-up examination after completed treatment for conditions other than malignant neoplasm: Secondary | ICD-10-CM | POA: Diagnosis not present

## 2023-06-26 DIAGNOSIS — I87391 Chronic venous hypertension (idiopathic) with other complications of right lower extremity: Secondary | ICD-10-CM | POA: Diagnosis not present

## 2023-06-26 DIAGNOSIS — I83891 Varicose veins of right lower extremities with other complications: Secondary | ICD-10-CM | POA: Diagnosis not present

## 2023-07-03 ENCOUNTER — Ambulatory Visit (INDEPENDENT_AMBULATORY_CARE_PROVIDER_SITE_OTHER): Payer: Medicare HMO | Admitting: Family Medicine

## 2023-07-03 ENCOUNTER — Ambulatory Visit: Payer: Medicare HMO

## 2023-07-03 ENCOUNTER — Other Ambulatory Visit: Payer: Medicare HMO

## 2023-07-03 ENCOUNTER — Encounter: Payer: Self-pay | Admitting: Family Medicine

## 2023-07-03 VITALS — BP 124/72 | HR 91 | Temp 98.8°F | Wt 189.0 lb

## 2023-07-03 DIAGNOSIS — J984 Other disorders of lung: Secondary | ICD-10-CM

## 2023-07-03 DIAGNOSIS — Z23 Encounter for immunization: Secondary | ICD-10-CM

## 2023-07-03 DIAGNOSIS — E1142 Type 2 diabetes mellitus with diabetic polyneuropathy: Secondary | ICD-10-CM

## 2023-07-03 DIAGNOSIS — I83813 Varicose veins of bilateral lower extremities with pain: Secondary | ICD-10-CM

## 2023-07-03 DIAGNOSIS — I8393 Asymptomatic varicose veins of bilateral lower extremities: Secondary | ICD-10-CM | POA: Insufficient documentation

## 2023-07-03 LAB — MICROALBUMIN / CREATININE URINE RATIO
Creatinine,U: 171.9 mg/dL
Microalb Creat Ratio: 3.4 mg/g (ref 0.0–30.0)
Microalb, Ur: 5.8 mg/dL — ABNORMAL HIGH (ref 0.0–1.9)

## 2023-07-03 NOTE — Progress Notes (Signed)
Subjective:    Patient ID: Jerome Vaughn, male    DOB: Jun 10, 1951, 72 y.o.   MRN: [id number]  HPI    Review of Systems     Objective:   Physical Exam        Assessment & Plan:

## 2023-07-03 NOTE — Progress Notes (Signed)
Subjective:    Patient ID: Jerome Vaughn, male    DOB: 1951/04/29, 72 y.o.   MRN: [id number]  HPI Here fro several issues. 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 Ardyth Gal on8-2-24. He first did a venous doppler that was negative for any DVT. Since then he has had a catheter ablation on the right leg, and he is scheduled for a chemical ablation on 07-10-23. He says the pain and swelling in the right lower leg has improved quite a bit. Second his My Chart notified him that he has not had a diabetic urine protein test yet this year. He sees Dr. Lonzo Cloud for his diabetes. Third he sees Dr. Leonides Schanz for monoclonal gammopathy of uncertain significance, and he had a bone survey performed on 05-22-23 that was negative for any lytic bone lesions. However it did show a spot in the right side of the chest that may or may not be a nipple shadow. He is here to follow this up.    Review of Systems  Constitutional: Negative.   Respiratory: Negative.    Cardiovascular:  Positive for leg swelling. Negative for chest pain and palpitations.       Objective:   Physical Exam Constitutional:      Appearance: Normal appearance.  Cardiovascular:     Rate and Rhythm: Normal rate and regular rhythm.     Pulses: Normal pulses.     Heart sounds: Normal heart sounds.  Pulmonary:     Effort: Pulmonary effort is normal.     Breath sounds: Normal breath sounds.  Musculoskeletal:     Comments: The right lower leg has 1+ edema and is slightly tender over the calf. The left leg has no edema   Neurological:     Mental Status: He is alert.           Assessment & Plan:  For the diabetic urine test, we will send him for a urine microalbumin test today. For the varicose veins, he will follow up with Dr. Ardyth Gal. For the right chest spot on the recent bone survey, we will send him for a CXR with nipple markers today.  Gershon Crane, MD

## 2023-07-10 DIAGNOSIS — I83891 Varicose veins of right lower extremities with other complications: Secondary | ICD-10-CM | POA: Diagnosis not present

## 2023-07-13 ENCOUNTER — Telehealth: Payer: Self-pay

## 2023-07-13 DIAGNOSIS — D508 Other iron deficiency anemias: Secondary | ICD-10-CM

## 2023-07-13 NOTE — Telephone Encounter (Signed)
-----   Message from [facility] [place] [facility] sent at 04/13/2023  1:30 PM EDT ----- Regarding: due for labs  recall for repeat CBC and tIBC /ferritin in 3 months = October

## 2023-07-13 NOTE — Telephone Encounter (Signed)
Labs entered.  MyChart message sent to patient to go to the lab [facility] [place] this week

## 2023-07-15 NOTE — Telephone Encounter (Signed)
Letter mailed to patient to please go to the lab

## 2023-07-16 ENCOUNTER — Other Ambulatory Visit: Payer: Self-pay

## 2023-07-16 DIAGNOSIS — E118 Type 2 diabetes mellitus with unspecified complications: Secondary | ICD-10-CM

## 2023-07-16 MED ORDER — REPAGLINIDE 1 MG PO TABS: 1.0000 mg | ORAL_TABLET | Freq: Two times a day (BID) | ORAL | 3 refills | Status: DC

## 2023-07-20 ENCOUNTER — Encounter: Payer: Self-pay | Admitting: Family Medicine

## 2023-07-22 ENCOUNTER — Telehealth: Payer: Self-pay | Admitting: Gastroenterology

## 2023-07-22 NOTE — Telephone Encounter (Signed)
Inbound call from patient requesting a call stating he had received a letter in the mail about getting labs. Patient is requesting a call to discuss what the labs are for. Please advise.

## 2023-07-22 NOTE — Telephone Encounter (Signed)
Called and spoke to patient and let him know Dr. Mervyn Skeeters ordered a CBC and iron studies to monitor his IDA. Patient expressed understanding and will go the lab this week

## 2023-07-24 ENCOUNTER — Ambulatory Visit (INDEPENDENT_AMBULATORY_CARE_PROVIDER_SITE_OTHER): Payer: Medicare HMO | Admitting: Podiatry

## 2023-07-24 ENCOUNTER — Encounter: Payer: Self-pay | Admitting: Podiatry

## 2023-07-24 ENCOUNTER — Other Ambulatory Visit (INDEPENDENT_AMBULATORY_CARE_PROVIDER_SITE_OTHER): Payer: Medicare HMO

## 2023-07-24 DIAGNOSIS — M79675 Pain in left toe(s): Secondary | ICD-10-CM | POA: Diagnosis not present

## 2023-07-24 DIAGNOSIS — D508 Other iron deficiency anemias: Secondary | ICD-10-CM

## 2023-07-24 DIAGNOSIS — E118 Type 2 diabetes mellitus with unspecified complications: Secondary | ICD-10-CM | POA: Diagnosis not present

## 2023-07-24 DIAGNOSIS — M79674 Pain in right toe(s): Secondary | ICD-10-CM | POA: Diagnosis not present

## 2023-07-24 DIAGNOSIS — I87091 Postthrombotic syndrome with other complications of right lower extremity: Secondary | ICD-10-CM | POA: Diagnosis not present

## 2023-07-24 DIAGNOSIS — B351 Tinea unguium: Secondary | ICD-10-CM | POA: Diagnosis not present

## 2023-07-24 DIAGNOSIS — I83891 Varicose veins of right lower extremities with other complications: Secondary | ICD-10-CM | POA: Diagnosis not present

## 2023-07-24 LAB — IBC + FERRITIN
Ferritin: 25.4 ng/mL (ref 22.0–322.0)
Iron: 89 ug/dL (ref 42–165)
Saturation Ratios: 21.5 % (ref 20.0–50.0)
TIBC: 414.4 ug/dL (ref 250.0–450.0)
Transferrin: 296 mg/dL (ref 212.0–360.0)

## 2023-07-24 LAB — CBC WITH DIFFERENTIAL/PLATELET
Basophils Absolute: 0 10*3/uL (ref 0.0–0.1)
Basophils Relative: 0.9 % (ref 0.0–3.0)
Eosinophils Absolute: 0.1 10*3/uL (ref 0.0–0.7)
Eosinophils Relative: 1.6 % (ref 0.0–5.0)
HCT: 39.8 % (ref 39.0–52.0)
Hemoglobin: 13 g/dL (ref 13.0–17.0)
Lymphocytes Relative: 40.3 % (ref 12.0–46.0)
Lymphs Abs: 1.3 10*3/uL (ref 0.7–4.0)
MCHC: 32.7 g/dL (ref 30.0–36.0)
MCV: 90.8 fL (ref 78.0–100.0)
Monocytes Absolute: 0.4 10*3/uL (ref 0.1–1.0)
Monocytes Relative: 12.1 % — ABNORMAL HIGH (ref 3.0–12.0)
Neutro Abs: 1.4 10*3/uL (ref 1.4–7.7)
Neutrophils Relative %: 45.1 % (ref 43.0–77.0)
Platelets: 128 10*3/uL — ABNORMAL LOW (ref 150.0–400.0)
RBC: 4.38 Mil/uL (ref 4.22–5.81)
RDW: 14 % (ref 11.5–15.5)
WBC: 3.1 10*3/uL — ABNORMAL LOW (ref 4.0–10.5)

## 2023-07-24 NOTE — Progress Notes (Signed)
Subjective:  Patient ID: Jerome Vaughn, male    DOB: [birth year],  MRN: [id number]  Chief Complaint  Patient presents with   [facility]    RM#7 [facility]   72 y.o. male returns for the above complaint.  Patient presents with complaint of thickened elongated dystrophic toenails x10.  They are painful to touch.  Patient is a diabetic with last A1c of 9.8.  He is not able to do it himself.  He would like for me to debride them now.  He denies any other acute complaints.  Objective:  There were no vitals filed for this visit. Podiatric Exam: Vascular: dorsalis pedis and posterior tibial pulses are palpable bilateral. Capillary return is immediate. Temperature gradient is WNL. Skin turgor WNL  Sensorium: Decreased Semmes Weinstein monofilament test.  Decreased tactile sensation bilaterally. Nail Exam: Pt has thick disfigured discolored nails with subungual debris noted bilateral entire nail hallux through fifth toenails.  Pain on palpation to the nails. Ulcer Exam: There is no evidence of ulcer or pre-ulcerative changes or infection. Orthopedic Exam: Muscle tone and strength are WNL. No limitations in general ROM. No crepitus or effusions noted. HAV  B/L.  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 Skin: No Porokeratosis. No infection or ulcers    Assessment & Plan:   No diagnosis found.       Patient was evaluated and treated and all questions answered.  Onychomycosis with pain  -Nails palliatively debrided as below. -Educated on self-care -Penlac was sent for nail fungus.  Advised him apply twice a [place] can take 6 to 8 months to resolve.  He states understanding  Procedure: Nail Debridement Rationale: pain  Type of Debridement: manual, sharp debridement. Instrumentation: Nail nipper, rotary burr. Number of Nails: 10  Procedures and Treatment: Consent by patient was obtained for treatment procedures. The patient understood the discussion of treatment and procedures well. All questions were answered thoroughly  reviewed. Debridement of mycotic and hypertrophic toenails, 1 through 5 bilateral and clearing of subungual debris. No ulceration, no infection noted.  Return Visit-Office Procedure: Patient instructed to return to the office for a follow up visit 3 months for continued evaluation and treatment.  Nicholes Rough, DPM    Return in about 3 months (around 10/24/2023) for Routine Foot Care.

## 2023-07-31 DIAGNOSIS — M67911 Unspecified disorder of synovium and tendon, right shoulder: Secondary | ICD-10-CM | POA: Diagnosis not present

## 2023-07-31 DIAGNOSIS — M24811 Other specific joint derangements of right shoulder, not elsewhere classified: Secondary | ICD-10-CM | POA: Diagnosis not present

## 2023-07-31 DIAGNOSIS — M75121 Complete rotator cuff tear or rupture of right shoulder, not specified as traumatic: Secondary | ICD-10-CM | POA: Diagnosis not present

## 2023-08-07 ENCOUNTER — Encounter: Payer: Self-pay | Admitting: Internal Medicine

## 2023-08-07 ENCOUNTER — Ambulatory Visit: Payer: Medicare HMO | Admitting: Internal Medicine

## 2023-08-07 VITALS — BP 134/70 | HR 84 | Ht 67.75 in | Wt 190.0 lb

## 2023-08-07 DIAGNOSIS — Z7985 Long-term (current) use of injectable non-insulin antidiabetic drugs: Secondary | ICD-10-CM

## 2023-08-07 DIAGNOSIS — Z7984 Long term (current) use of oral hypoglycemic drugs: Secondary | ICD-10-CM | POA: Diagnosis not present

## 2023-08-07 DIAGNOSIS — E118 Type 2 diabetes mellitus with unspecified complications: Secondary | ICD-10-CM

## 2023-08-07 DIAGNOSIS — E1159 Type 2 diabetes mellitus with other circulatory complications: Secondary | ICD-10-CM

## 2023-08-07 DIAGNOSIS — E1142 Type 2 diabetes mellitus with diabetic polyneuropathy: Secondary | ICD-10-CM

## 2023-08-07 LAB — POCT GLYCOSYLATED HEMOGLOBIN (HGB A1C): Hemoglobin A1C: 6.4 % — AB (ref 4.0–5.6)

## 2023-08-07 MED ORDER — SEMAGLUTIDE(0.25 OR 0.5MG/DOS) 2 MG/3ML ~~LOC~~ SOPN: 0.5000 mg | PEN_INJECTOR | SUBCUTANEOUS | 3 refills | Status: DC

## 2023-08-07 MED ORDER — METFORMIN HCL ER 500 MG PO TB24: 2000.0000 mg | ORAL_TABLET | Freq: Every day | ORAL | 3 refills | Status: DC

## 2023-08-07 MED ORDER — ACCU-CHEK GUIDE VI STRP: ORAL_STRIP | 3 refills | Status: DC

## 2023-08-07 MED ORDER — ACCU-CHEK GUIDE VI STRP: 1.0000 | ORAL_STRIP | Freq: Two times a day (BID) | 3 refills | Status: DC

## 2023-08-07 MED ORDER — REPAGLINIDE 0.5 MG PO TABS: 0.5000 mg | ORAL_TABLET | Freq: Two times a day (BID) | ORAL | 3 refills | Status: DC

## 2023-08-07 NOTE — Progress Notes (Signed)
Name: Jerome Vaughn  Age/ Sex: 72 y.o., male   MRN/ DOB: [id number], October 21, 1950     PCP: Nelwyn Salisbury, MD   Reason for Endocrinology Evaluation: Type 2 Diabetes Mellitus  Initial Endocrine Consultative Visit: 10/04/2013    PATIENT IDENTIFIER: Jerome Vaughn is a 72 y.o. male with a past medical history of DM, HTN, CAD, dyslipidemia the patient has followed with Endocrinology clinic since 10/04/2013 for consultative assistance with management of his diabetes.  DIABETIC HISTORY:  Jerome Vaughn was diagnosed with DM 2002.  Pioglitazone-edema. His hemoglobin A1c has ranged from 6.5% in 2021, peaking at 9.2% in 2023.   He was followed by Dr. Everardo All from 2015 until 12/2021  Patient self discontinued bromocriptine 06/2022 Started Ozempic 01/2023  SUBJECTIVE:   During the last visit (02/02/2023): A1c 6.6%  Today (08/07/2023): Jerome Vaughn is here for follow-up on diabetes management.  He checks his blood sugars 2 times daily. The patient has not  had hypoglycemic episodes since the last clinic visit.   Patient had a follow-up with podiatry on 07/24/2023 Denies nausea, vomiting  Denies constipation or diarrhea  He has been following up with vascular for varicose veins, completed right lower extremity treatment He received Jardiance though Cardiology through [facility] DIABETES REGIMEN:  Metformin 500 mg XR, 2 tabs BID  Jardiance 10 mg daily- through cardiology  Prandin 1 mg twice daily Ozempic 0.5 mg weekly      Statin: Yes ACE-I/ARB: Yes   METER DOWNLOAD SUMMARY:  [id number]    DIABETIC COMPLICATIONS: Microvascular complications:  Peripheral neuropathy Denies:  Last Eye Exam: Completed 05/2023  Macrovascular complications:  CAD Denies:  CVA, PVD   HISTORY:  Past Medical History:  Past Medical History:  Diagnosis Date   Carpal tunnel syndrome    both hands   Colon polyps    Coronary artery disease    sees Dr. Royann Shivers   Diabetes mellitus    sees Dr. Lesly Dukes   GERD (gastroesophageal reflux disease)    History of echocardiogram    a. Echo 4/17: Moderate LVH, EF 50-55%, mild LAE, no pericardial effusion   Hyperlipidemia    Hypertension    MGUS (monoclonal gammopathy of unknown significance)    sees Dr. Jeanie Sewer   Neuropathy    gets foot exams with Dr. Gala Lewandowsky (podiatry)   Pneumonia    Rotator cuff injury    Past Surgical History:  Past Surgical History:  Procedure Laterality Date   CARDIAC CATHETERIZATION N/A 11/21/2015   Procedure: Left Heart Cath and Coronary Angiography;  Surgeon: Lyn Records, MD;  Location: [facility] INVASIVE CV LAB;  Service: Cardiovascular;  Laterality: N/A;   COLONOSCOPY  02/23/2020   per Dr. Orvan Falconer, tubular adenomas, repeat in 3 yrs    colonsocopy  12/07/2009   per Dr. Matthias Hughs, benign polyps, repeat in 10 yrs    CORONARY ARTERY BYPASS GRAFT N/A 11/22/2015   Procedure: CORONARY ARTERY BYPASS GRAFTING (CABG) times five using the left internal mammary, right greater saphenous vein EVH, and left thigh greater saphenous vein EVH;  Surgeon: Kerin Perna, MD;  Location: [facility] OR;  Service: Open Heart Surgery;  Laterality: N/A;   CORONARY PRESSURE/FFR STUDY N/A 12/14/2018   Procedure: INTRAVASCULAR PRESSURE WIRE/FFR STUDY;  Surgeon: Corky Crafts, MD;  Location: [facility] INVASIVE CV LAB;  Service: Cardiovascular;  Laterality: N/A;   frozen shoulder release Right    LEFT HEART CATH AND CORS/GRAFTS ANGIOGRAPHY N/A 10/27/2017  Procedure: LEFT HEART CATH AND CORS/GRAFTS ANGIOGRAPHY;  Surgeon: Lyn Records, MD;  Location: [facility] INVASIVE CV LAB;  Service: Cardiovascular;  Laterality: N/A;   LEFT HEART CATH AND CORS/GRAFTS ANGIOGRAPHY N/A 12/14/2018   Procedure: LEFT HEART CATH AND CORS/GRAFTS ANGIOGRAPHY;  Surgeon: Corky Crafts, MD;  Location: [facility] INVASIVE CV LAB;  Service: Cardiovascular;  Laterality: N/A;   TEE WITHOUT CARDIOVERSION N/A 11/22/2015   Procedure: TRANSESOPHAGEAL ECHOCARDIOGRAM (TEE);  Surgeon:  Kerin Perna, MD;  Location: [facility] OR;  Service: Open Heart Surgery;  Laterality: N/A;   WRIST GANGLION EXCISION Right    Social History:  reports that he quit smoking about 9 years ago. His smoking use included cigarettes. He started smoking about 29 years ago. He has a 5 pack-year smoking history. He has never used smokeless tobacco. He reports current alcohol use of about 3.0 - 4.0 standard drinks of alcohol per week. He reports that he does not use drugs. Family History:  Family History  Problem Relation Age of Onset   Heart failure Father    Hyperlipidemia Sister    Diabetes Sister    Hypertension Sister    Kidney failure Sister    Hyperlipidemia Sister    Diabetes Sister    Colon polyps Sister    Kidney disease Sister    Anuerysm Brother        AAA   Other Brother        COVID 19   Diabetes Maternal Grandmother    Diabetes Son    Heart disease Son    Heart attack Neg Hx    Colon cancer Neg Hx    Stomach cancer Neg Hx    Esophageal cancer Neg Hx    Pancreatic cancer Neg Hx      HOME MEDICATIONS: Allergies as of 08/07/2023   No Known Allergies      Medication List        Accurate as of August 07, 2023  9:33 AM. If you have any questions, ask your nurse or doctor.          Accu-Chek Guide test strip Generic drug: glucose blood Use to monitor glucose levels 1X daily; E11.8   Accu-Chek Guide w/Device Kit USE AS DIRECTED   Accu-Chek Softclix Lancets lancets Use to monitor glucose levels 1X daily; E11.8   acetaminophen 500 MG tablet Commonly known as: TYLENOL Take 500 mg by mouth as needed.   APPLE CIDER VINEGAR PO Take by mouth daily.   aspirin EC 81 MG tablet Take 1 tablet (81 mg total) by mouth daily.   benazepril 20 MG tablet Commonly known as: LOTENSIN Take 1 tablet (20 mg total) by mouth daily.   carvedilol 3.125 MG tablet Commonly known as: COREG Take 1 tablet (3.125 mg total) by mouth 2 (two) times daily with a meal.   Cinnamon 500 MG  capsule Take 1 capsule by mouth daily.   CoQ-10 100 MG Caps Take 1 tablet by mouth daily.   ezetimibe 10 MG tablet Commonly known as: ZETIA Take 1 tablet (10 mg total) by mouth daily.   ferrous sulfate 325 (65 FE) MG tablet Take 325 mg by mouth daily with breakfast.   FISH OIL PO Take 1 capsule by mouth daily.   fluticasone 50 MCG/ACT nasal spray Commonly known as: FLONASE Place 2 sprays into both nostrils daily.   Garlic 1000 MG Caps Take 1 tablet by mouth daily.   meloxicam 15 MG tablet Commonly known as: MOBIC Take 15 mg by  mouth daily.   meloxicam 15 MG tablet Commonly known as: MOBIC Take 1 tablet by mouth daily as needed.   metFORMIN 500 MG 24 hr tablet Commonly known as: GLUCOPHAGE-XR Take 4 tablets (2,000 mg total) by mouth daily.   multivitamin tablet Take 1 tablet by mouth daily.   Na Sulfate-K Sulfate-Mg Sulf 17.5-3.13-1.6 GM/177ML Soln Take by mouth.   nitroGLYCERIN 0.4 MG SL tablet Commonly known as: NITROSTAT Place 1 tablet (0.4 mg total) under the tongue every 5 (five) minutes as needed for chest pain.   pantoprazole 40 MG tablet Commonly known as: PROTONIX Take 1 tablet (40 mg total) by mouth 2 (two) times daily.   ranolazine 500 MG 12 hr tablet Commonly known as: RANEXA Take 1 tablet (500 mg total) by mouth 2 (two) times daily.   repaglinide 1 MG tablet Commonly known as: PRANDIN Take 1 tablet (1 mg total) by mouth 2 (two) times daily before a meal.   rosuvastatin 40 MG tablet Commonly known as: CRESTOR TAKE 1 TABLET EVERY Tiger   Semaglutide(0.25 or 0.5MG /DOS) 2 MG/3ML Sopn Inject 0.5 mg into the skin once a week. What changed: how much to take   sildenafil 100 MG tablet Commonly known as: VIAGRA Take 1 tablet (100 mg total) by mouth as needed for erectile dysfunction.   tadalafil 20 MG tablet Commonly known as: CIALIS TAKE 1 TABLET BY MOUTH AS NEEDED FOR ERECTILE DYSFUNCTION   VITAMIN B-12 PO Take 1 tablet by mouth daily.    VITAMIN C PO Take 1 tablet by mouth daily.   VITAMIN E PO Take 1 capsule by mouth daily.         OBJECTIVE:   Vital Signs: BP 134/70 (BP Location: Left Arm, Patient Position: Sitting, Cuff Size: Large)   Pulse 84   Ht 5' 7.75" (1.721 m)   Wt 190 lb (86.2 kg)   SpO2 97%   BMI 29.10 kg/m   Wt Readings from Last 3 Encounters:  08/07/23 190 lb (86.2 kg)  07/03/23 189 lb (85.7 kg)  05/01/23 188 lb 4.8 oz (85.4 kg)     Exam: General: Jerome Vaughn appears well and is in NAD  Lungs: Clear with good BS bilat   Heart: RRR   Abdomen:  soft, nontender  Extremities: No pretibial edema.   Neuro: MS is good with appropriate affect, Jerome Vaughn is alert and Ox3    DM foot exam: Per podiatry 01/16/2023       DATA REVIEWED:  Lab Results  Component Value Date   HGBA1C 6.4 (A) 08/07/2023   HGBA1C 6.6 (A) 02/02/2023   HGBA1C 8.0 (H) 10/10/2022    Latest Reference Range & Units 04/10/23 09:52  Sodium 135 - 145 mmol/L 135  Potassium 3.5 - 5.1 mmol/L 4.5  Chloride 98 - 111 mmol/L 106  CO2 22 - 32 mmol/L 21 (L)  Glucose 70 - 99 mg/dL 657 (H)  BUN 8 - 23 mg/dL 21  Creatinine 8.46 - 9.62 mg/dL 9.52  Calcium 8.9 - 84.1 mg/dL 9.1  Anion gap 5 - 15  8  Alkaline Phosphatase 38 - 126 U/L 23 (L)  Albumin 3.5 - 5.0 g/dL 4.2  AST 15 - 41 U/L 20  ALT 0 - 44 U/L 23  Total Protein 6.5 - 8.1 g/dL 7.7  Total Bilirubin 0.3 - 1.2 mg/dL 0.4  GFR, Est Non African American >60 mL/min >60   Old records , labs and images have been reviewed.   ASSESSMENT / PLAN / RECOMMENDATIONS:  1) Type 2 Diabetes Mellitus, Optimally controlled, With neuropathic and macrovascular complications - Most recent A1c of 6.4 %. Goal A1c < 7.0 %.     -A1c at goal -He is on Jardiance to cardiology -I will decrease Prandin, preemptively to prevent hypoglycemia    MEDICATIONS: Continue Ozempic  0.5 mg weekly Continue metformin 500 mg XR, 2 tabs twice daily Continue Jardiance 10 mg daily Decrease Prandin 0.5 mg twice  daily  EDUCATION / INSTRUCTIONS: BG monitoring instructions: Patient is instructed to check his blood sugars 1 times daily. Call [facility] if: BG persistently < 70  I reviewed the Rule of 15 for the treatment of hypoglycemia in detail with the patient. Literature supplied.    2) Diabetic complications:  Eye: Does not have known diabetic retinopathy.  Neuro/ Feet: Does  have known diabetic peripheral neuropathy .  Renal: Patient does not have known baseline CKD. He   is on an ACEI/ARB at present.     F/U in 6 months   Signed electronically by: Lyndle Herrlich, MD  [facility]  [facility] [address] [place] [place], [place] [address] Phone: [phone] FAX: [phone]   CC: Nelwyn Salisbury, MD [address] [place] [address] Phone: [phone]  Fax: [phone]  Return to Endocrinology clinic as below: Future Appointments  Date Time Provider Department Center  10/23/2023  9:45 AM Helane Gunther, DPM [facility]  12/18/2023 11:30 AM [facility]AL WELLNESS VISIT [facility]  04/29/2024 11:30 AM [facility] None  05/06/2024 11:20 AM Jaci Standard, MD [facility] None

## 2023-08-07 NOTE — Patient Instructions (Signed)
Continue Metformin 500 mg XR, 2 tablets twice daily  Continue Jardiance 10 mg daily Continue Ozempic 0.5 mg once weekly  Decrease Prandin 0.5 mg , 1 tablet before Breakfast and 1 tablet before Supper    HOW TO TREAT LOW BLOOD SUGARS (Blood sugar LESS THAN 70 MG/DL) Please follow the RULE OF 15 for the treatment of hypoglycemia treatment (when your (blood sugars are less than 70 mg/dL)   STEP 1: Take 15 grams of carbohydrates when your blood sugar is low, which includes:  3-4 GLUCOSE TABS  OR 3-4 OZ OF JUICE OR REGULAR SODA OR ONE TUBE OF GLUCOSE GEL    STEP 2: RECHECK blood sugar in 15 MINUTES STEP 3: If your blood sugar is still low at the 15 minute recheck --> then, go back to STEP 1 and treat AGAIN with another 15 grams of carbohydrates.

## 2023-09-04 ENCOUNTER — Other Ambulatory Visit: Payer: Self-pay

## 2023-09-04 DIAGNOSIS — E1165 Type 2 diabetes mellitus with hyperglycemia: Secondary | ICD-10-CM

## 2023-09-04 MED ORDER — ACCU-CHEK GUIDE TEST VI STRP: ORAL_STRIP | 12 refills | Status: DC

## 2023-09-04 MED ORDER — LANCETS MISC. MISC: 1.0000 | Freq: Three times a day (TID) | 0 refills | Status: AC

## 2023-09-04 MED ORDER — BLOOD GLUCOSE TEST VI STRP
1.0000 | ORAL_STRIP | Freq: Three times a day (TID) | 0 refills | Status: DC
Start: 2023-09-04 — End: 2024-03-25

## 2023-09-04 MED ORDER — LANCET DEVICE MISC: 1.0000 | Freq: Three times a day (TID) | 0 refills | Status: AC

## 2023-09-04 MED ORDER — BLOOD GLUCOSE MONITORING SUPPL DEVI: 1.0000 | Freq: Three times a day (TID) | 0 refills | Status: DC

## 2023-09-26 DIAGNOSIS — H524 Presbyopia: Secondary | ICD-10-CM | POA: Diagnosis not present

## 2023-10-04 ENCOUNTER — Other Ambulatory Visit: Payer: Self-pay | Admitting: Cardiovascular Disease

## 2023-10-04 ENCOUNTER — Other Ambulatory Visit: Payer: Self-pay | Admitting: Internal Medicine

## 2023-10-04 DIAGNOSIS — E118 Type 2 diabetes mellitus with unspecified complications: Secondary | ICD-10-CM

## 2023-10-08 MED ORDER — RANOLAZINE ER 500 MG PO TB12: 500.0000 mg | ORAL_TABLET | Freq: Two times a day (BID) | ORAL | 0 refills | Status: DC

## 2023-10-08 NOTE — Telephone Encounter (Signed)
 Called left message on secure voicemail informing patient . He will need to contact office for annual appointment with Dr [name].  Once appointment is schedule will be able to send prescription to mail order.   Can send a 30 [place] supply to local pharmacy once patient call back with local pharmacy address.

## 2023-10-11 ENCOUNTER — Other Ambulatory Visit: Payer: Self-pay | Admitting: Family Medicine

## 2023-10-14 ENCOUNTER — Other Ambulatory Visit: Payer: Self-pay | Admitting: Cardiovascular Disease

## 2023-10-20 ENCOUNTER — Other Ambulatory Visit: Payer: Self-pay | Admitting: Internal Medicine

## 2023-10-23 ENCOUNTER — Ambulatory Visit (INDEPENDENT_AMBULATORY_CARE_PROVIDER_SITE_OTHER): Payer: Medicare HMO | Admitting: Podiatry

## 2023-10-23 ENCOUNTER — Encounter: Payer: Self-pay | Admitting: Podiatry

## 2023-10-23 DIAGNOSIS — E118 Type 2 diabetes mellitus with unspecified complications: Secondary | ICD-10-CM | POA: Diagnosis not present

## 2023-10-23 DIAGNOSIS — B351 Tinea unguium: Secondary | ICD-10-CM | POA: Diagnosis not present

## 2023-10-23 DIAGNOSIS — M79674 Pain in right toe(s): Secondary | ICD-10-CM | POA: Diagnosis not present

## 2023-10-23 DIAGNOSIS — M79675 Pain in left toe(s): Secondary | ICD-10-CM | POA: Diagnosis not present

## 2023-10-23 NOTE — Progress Notes (Signed)
This patient returns to my office for at risk foot care.  This patient requires this care by a professional since this patient will be at risk due to having This patient is unable to cut nails himself since the patient cannot reach his nails.These nails are painful walking and wearing shoes.  This patient presents for at risk foot care today.  General Appearance  Alert, conversant and in no acute stress.  Vascular  Dorsalis pedis and posterior tibial  pulses are palpable  bilaterally.  Capillary return is within normal limits  bilaterally. Temperature is within normal limits  bilaterally.  Neurologic  Senn-Weinstein monofilament wire test within normal limits  bilaterally. Muscle power within normal limits bilaterally.  Nails Thick disfigured discolored nails with subungual debris  from hallux to fifth toes bilaterally. No evidence of bacterial infection or drainage bilaterally.  Orthopedic  No limitations of motion  feet .  No crepitus or effusions noted.  No bony pathology or digital deformities noted.  Skin  normotropic skin with no porokeratosis noted bilaterally.  No signs of infections or ulcers noted.     Onychomycosis  Pain in right toes  Pain in left toes  Consent was obtained for treatment procedures.   Mechanical debridement of nails 1-5  bilaterally performed with a nail nipper.  Filed with dremel without incident.    Return office visit     3 months                Told patient to return for periodic foot care and evaluation due to potential at risk complications.   Helane Gunther DPM

## 2023-11-05 ENCOUNTER — Other Ambulatory Visit: Payer: Self-pay | Admitting: Cardiovascular Disease

## 2023-11-06 ENCOUNTER — Other Ambulatory Visit: Payer: Self-pay | Admitting: Cardiovascular Disease

## 2023-11-18 MED ORDER — RANOLAZINE ER 500 MG PO TB12
500.0000 mg | ORAL_TABLET | Freq: Two times a day (BID) | ORAL | 0 refills | Status: DC
Start: 2023-11-18 — End: 2023-11-30

## 2023-11-18 NOTE — Addendum Note (Signed)
Addended by: Margaret Pyle D on: 11/18/2023 10:25 AM   Modules accepted: Orders

## 2023-11-20 NOTE — Telephone Encounter (Signed)
Prescription Request  11/20/2023  LOV: Visit date not found  What is the name of the medication or equipment? ranolazine (RANEXA) 500 MG 12 hr tablet   Have you contacted your pharmacy to request a refill? No   Which pharmacy would you like this sent to?   [facility]  Phone: [phone]  Fax: [phone] or [phone] E-scrib address: [address] [place] [address]  Patient notified that their request is being sent to the clinical staff for review and that they should receive a response within 2 business days.   Please advise at Mobile [phone] (mobile)   Pts Email address must be included on the RX: [email or web address]

## 2023-11-27 NOTE — Telephone Encounter (Signed)
 Fine to send to his preferred pharmacy

## 2023-11-28 ENCOUNTER — Other Ambulatory Visit: Payer: Self-pay | Admitting: Cardiovascular Disease

## 2023-11-30 ENCOUNTER — Other Ambulatory Visit: Payer: Self-pay

## 2023-11-30 MED ORDER — RANOLAZINE ER 500 MG PO TB12: 500.0000 mg | ORAL_TABLET | Freq: Two times a day (BID) | ORAL | 1 refills | Status: DC

## 2023-11-30 NOTE — Telephone Encounter (Signed)
 Pt Rx sent per Dr Clent Ridges approval

## 2023-12-13 ENCOUNTER — Other Ambulatory Visit: Payer: Self-pay | Admitting: Gastroenterology

## 2023-12-13 DIAGNOSIS — K219 Gastro-esophageal reflux disease without esophagitis: Secondary | ICD-10-CM

## 2023-12-18 ENCOUNTER — Ambulatory Visit: Payer: Medicare HMO | Attending: Cardiovascular Disease | Admitting: Cardiovascular Disease

## 2023-12-18 ENCOUNTER — Encounter: Payer: Self-pay | Admitting: Cardiovascular Disease

## 2023-12-18 VITALS — BP 139/85 | HR 76 | Ht 67.5 in | Wt 183.4 lb

## 2023-12-18 DIAGNOSIS — N521 Erectile dysfunction due to diseases classified elsewhere: Secondary | ICD-10-CM

## 2023-12-18 DIAGNOSIS — I5042 Chronic combined systolic (congestive) and diastolic (congestive) heart failure: Secondary | ICD-10-CM

## 2023-12-18 DIAGNOSIS — I25708 Atherosclerosis of coronary artery bypass graft(s), unspecified, with other forms of angina pectoris: Secondary | ICD-10-CM

## 2023-12-18 DIAGNOSIS — E7800 Pure hypercholesterolemia, unspecified: Secondary | ICD-10-CM | POA: Diagnosis not present

## 2023-12-18 DIAGNOSIS — D472 Monoclonal gammopathy: Secondary | ICD-10-CM

## 2023-12-18 DIAGNOSIS — I10 Essential (primary) hypertension: Secondary | ICD-10-CM

## 2023-12-18 DIAGNOSIS — N182 Chronic kidney disease, stage 2 (mild): Secondary | ICD-10-CM

## 2023-12-18 DIAGNOSIS — E118 Type 2 diabetes mellitus with unspecified complications: Secondary | ICD-10-CM

## 2023-12-18 MED ORDER — NITROGLYCERIN 0.4 MG SL SUBL: 0.4000 mg | SUBLINGUAL_TABLET | SUBLINGUAL | 1 refills | Status: AC | PRN

## 2023-12-18 NOTE — Patient Instructions (Signed)
 Medication Instructions:  Your physician recommends that you continue on your current medications as directed. Please refer to the Current Medication list given to you today.  *If you need a refill on your cardiac medications before your next appointment, please call your pharmacy*   Lab Work: None ordered If you have labs (blood work) drawn today and your tests are completely normal, you will receive your results only by: MyChart Message (if you have MyChart) OR A paper copy in the mail If you have any lab test that is abnormal or we need to change your treatment, we will call you to review the results.   Testing/Procedures: None ordered   Follow-Up: At [facility], you and your health needs are our priority.  As part of our continuing mission to provide you with exceptional heart care, we have created designated Provider Care Teams.  These Care Teams include your primary Cardiologist (physician) and Advanced Practice Providers (APPs -  Physician Assistants and Nurse Practitioners) who all work together to provide you with the care you need, when you need it.  Your next appointment:   1 year(s)  The format for your next appointment:   In Person  Provider:   Dr. Royann Shivers    Thank you for choosing [facility]!!   [phone]

## 2023-12-18 NOTE — Progress Notes (Signed)
 Cardiology Office Note:    Date:  12/19/2023   ID:  Welton Bord Feild, DOB 1950/11/03, MRN [id number]  PCP:  Nelwyn Salisbury, MD    [facility] Providers Cardiologist:  Lesleigh Noe, MD (Inactive)     Referring MD: Nelwyn Salisbury, MD   Chief Complaint  Patient presents with   Coronary Artery Disease    History of Present Illness:    Jerome Vaughn is a 73 y.o. male with a hx of coronary artery disease (CABGx4 2017, known occlusion of SVG-Diag, patent LIMA-LAD, SVG-OM, SVG-RCA by cath 2020), left ventricular systolic dysfunction (most recent LVEF 35-45% by angiography in 2020), type 2 diabetes mellitus, hypercholesterolemia, essential hypertension, bilateral carpal tunnel syndrome, presenting for routine follow-up.  He is still very active.  He had planned to retire but then change his mind and he still operates a [employer].  Sometimes has to push heavy weights and does not have issues with chest pain or shortness of breath.  Continues to describe occasional "twinges" in his chest that are quite brief lasting for up to 15 seconds, but somewhat reminiscent of his angina which was precordial tightness.  He denies exertional dyspnea, orthopnea, PND, lower extremity edema or focal neurological complaints.  He has not had palpitations or syncope.  He continues to occasionally take PDE 5 inhibitors for erectile dysfunction.  He is aware these medication should never be "mixed" with nitrates".  Has not had a lipid profile since January 2024, but is scheduled for a physical with Kriste Basque on Feb 09, 2019 who will have his lipid profile and athletics checked.  In 2024 his HDL was 52, LDL 53, triglycerides mildly elevated at 219 .  We do have a more recent hemoglobin A1c of 6.4% from last November, which is a big improvement.  He is now taking metformin, Jardiance 10 mg daily ([facility] [facility]) Ozempic and Prandin.  He has borderline creatinine 1.13.  Most recent cardiac catheterization from 2020  showed LVEF 35-45%.  By echo the EF was estimated to be 45-50% and diastolic parameters were deemed to be "indeterminate", but on my review of the echo there is clear evidence of pseudo normal mitral filling pattern with elevated mean left atrial pressure, consistent with diastolic heart failure.  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  His carpal tunnel syndrome symptoms began 15 years ago, suggesting that they are probably not due to amyloidosis.  To date there have been no cardiac manifestations to suggest amyloidosis. His endocrinologist is Dr. Lonzo Cloud.  His hematologist is Dr. Leonides Schanz.   Past Medical History:  Diagnosis Date   Carpal tunnel syndrome    both hands   Colon polyps    Coronary artery disease    sees Dr. Royann Shivers   Diabetes mellitus    sees Dr. Lesly Dukes   GERD (gastroesophageal reflux disease)    History of echocardiogram    a. Echo 4/17: Moderate LVH, EF 50-55%, mild LAE, no pericardial effusion   Hyperlipidemia    Hypertension    MGUS (monoclonal gammopathy of unknown significance)    sees Dr. Jeanie Sewer   Neuropathy    gets foot exams with Dr. Gala Lewandowsky (podiatry)   Pneumonia    Rotator cuff injury     Past Surgical History:  Procedure Laterality Date   CARDIAC CATHETERIZATION N/A 11/21/2015   Procedure: Left Heart Cath and Coronary Angiography;  Surgeon: Sherilyn Cooter  Malissa Hippo, MD;  Location: [name];  Service: Cardiovascular;  Laterality: N/A;   COLONOSCOPY  02/23/2020   per Dr. Orvan Falconer, tubular adenomas, repeat in 3 yrs    colonsocopy  12/07/2009   per Dr. Matthias Hughs, benign polyps, repeat in 10 yrs    CORONARY ARTERY BYPASS GRAFT N/A 11/22/2015   Procedure: CORONARY ARTERY BYPASS GRAFTING (CABG) times five using the left internal mammary, right greater saphenous vein EVH, and left thigh greater saphenous vein EVH;  Surgeon: Kerin Perna, MD;   Location: [facility] OR;  Service: Open Heart Surgery;  Laterality: N/A;   CORONARY PRESSURE/FFR STUDY N/A 12/14/2018   Procedure: INTRAVASCULAR PRESSURE WIRE/FFR STUDY;  Surgeon: Corky Crafts, MD;  Location: [facility] INVASIVE CV LAB;  Service: Cardiovascular;  Laterality: N/A;   frozen shoulder release Right    LEFT HEART CATH AND CORS/GRAFTS ANGIOGRAPHY N/A 10/27/2017   Procedure: LEFT HEART CATH AND CORS/GRAFTS ANGIOGRAPHY;  Surgeon: Lyn Records, MD;  Location: [name];  Service: Cardiovascular;  Laterality: N/A;   LEFT HEART CATH AND CORS/GRAFTS ANGIOGRAPHY N/A 12/14/2018   Procedure: LEFT HEART CATH AND CORS/GRAFTS ANGIOGRAPHY;  Surgeon: Corky Crafts, MD;  Location: [facility] INVASIVE CV LAB;  Service: Cardiovascular;  Laterality: N/A;   TEE WITHOUT CARDIOVERSION N/A 11/22/2015   Procedure: TRANSESOPHAGEAL ECHOCARDIOGRAM (TEE);  Surgeon: Kerin Perna, MD;  Location: [facility] OR;  Service: Open Heart Surgery;  Laterality: N/A;   WRIST GANGLION EXCISION Right     Current Medications: Current Meds  Medication Sig   Accu-Chek Softclix Lancets lancets USE IN THE MORNING, AT NOON, AND AT BEDTIME.   APPLE CIDER VINEGAR PO Take by mouth daily.    Ascorbic Acid (VITAMIN C PO) Take 1 tablet by mouth daily.   aspirin EC 81 MG tablet Take 1 tablet (81 mg total) by mouth daily.   benazepril (LOTENSIN) 20 MG tablet Take 1 tablet (20 mg total) by mouth daily.   Blood Glucose Monitoring Suppl (ACCU-CHEK GUIDE) w/Device KIT USE AS DIRECTED   Blood Glucose Monitoring Suppl DEVI 1 each by Does not apply route in the morning, at noon, and at bedtime. May substitute to any manufacturer covered by patient's insurance. ACCU CHECK GUIDE   carvedilol (COREG) 3.125 MG tablet Take 1 tablet (3.125 mg total) by mouth 2 (two) times daily with a meal.   Cinnamon 500 MG capsule Take 1 capsule by mouth daily.   Coenzyme Q10 (COQ-10) 100 MG CAPS Take 1 tablet by mouth daily.   Cyanocobalamin (VITAMIN B-12 PO) Take 1  tablet by mouth daily.   ezetimibe (ZETIA) 10 MG tablet Take 1 tablet (10 mg total) by mouth daily.   ferrous sulfate 325 (65 FE) MG tablet Take 325 mg by mouth daily with breakfast.   Garlic 1000 MG CAPS Take 1 tablet by mouth daily.   glucose blood (ACCU-CHEK GUIDE) test strip 1 each by Other route in the morning and at bedtime. Use as instructed   meloxicam (MOBIC) 15 MG tablet Take 1 tablet by mouth daily as needed.   metFORMIN (GLUCOPHAGE-XR) 500 MG 24 hr tablet Take 4 tablets (2,000 mg total) by mouth daily.   Multiple Vitamin (MULTIVITAMIN) tablet Take 1 tablet by mouth daily.   Omega-3 Fatty Acids (FISH OIL PO) Take 1 capsule by mouth daily.   pantoprazole (PROTONIX) 40 MG tablet Take 1 tablet (40 mg total) by mouth 2 (two) times daily. Please schedule an office visit for further refills. Thank you  ranolazine (RANEXA) 500 MG 12 hr tablet Take 1 tablet (500 mg total) by mouth 2 (two) times daily.   repaglinide (PRANDIN) 0.5 MG tablet Take 1 tablet (0.5 mg total) by mouth 2 (two) times daily before a meal.   rosuvastatin (CRESTOR) 40 MG tablet TAKE 1 TABLET EVERY [employer]   Semaglutide,0.25 or 0.5MG /DOS, 2 MG/3ML SOPN Inject 0.5 mg into the skin once a week.   tadalafil (CIALIS) 20 MG tablet TAKE 1 TABLET BY MOUTH AS NEEDED FOR ERECTILE DYSFUNCTION   VITAMIN E PO Take 1 capsule by mouth daily.     Allergies:   Patient has no known allergies.     Family History: The patient's family history includes Anuerysm in his brother; Colon polyps in his sister; Diabetes in his maternal grandmother, sister, sister, and son; Heart disease in his son; Heart failure in his father; Hyperlipidemia in his sister and sister; Hypertension in his sister; Kidney disease in his sister; Kidney failure in his sister; Other in his brother. There is no history of Heart attack, Colon cancer, Stomach cancer, Esophageal cancer, or Pancreatic cancer.  ROS:   Please see the history of present illness.     All other  systems reviewed and are negative.  EKGs/Labs/Other Studies Reviewed:    The following studies were reviewed today: Cardiac catheterization 12/14/2018   Ost LM to Dist LM lesion is 65% stenosed. FFR of this lesion into the distal circumflex was negative, 0.95. Prox LAD lesion is 85% stenosed. LIMA to LAD is patent. 1st Diag lesion is 100% stenosed. SVG to diagonal is occluded. Ost 2nd Mrg to 2nd Mrg lesion is 65% stenosed. Jump graft from OM1 to OM2 is occluded. Ost 1st Mrg lesion is 75% stenosed. SVG to OM1 portion is patent. Mid RCA lesion is 100% stenosed. SVG to PDA is patent. Ostial PDA disease is unchanged from prior. Ost RPDA to RPDA lesion is 95% stenosed. The left ventricular ejection fraction is 35-45% by visual estimate. There is mild to moderate left ventricular systolic dysfunction. LV end diastolic pressure is normal.   Continue medical therapy for small vessel disease and for LV dysfunction.   Diagnostic Dominance: Right    Echocardiogram 12/13/2018     1. The left ventricle has mildly reduced systolic function, with an  ejection fraction of 45-50%. The cavity size was normal. Left ventricular  diastolic Doppler parameters are indeterminate. There is abnormal septal  motion consistent with post-operative  status. Left ventricular diffuse hypokinesis.   2. The right ventricle has normal systolic function. The cavity was  normal. There is no increase in right ventricular wall thickness.   3. Left atrial size was mildly dilated.   4. There is mild mitral annular calcification present.   5. The aortic valve is tricuspid Mild thickening of the aortic valve Mild  calcification of the aortic valve.   Diastology        LV e' lateral:   10.30 cm/s        LV E/e' lateral: 10.0        LV e' medial:    7.89 cm/s        LV E/e' medial:  13.1   MV Decel Time: 204 msec  MV E velocity: 103.00 cm/s SHUNTS  MV A velocity: 54.10 cm/s  Systemic Diam: 1.70 cm  MV E/A  ratio:  1.90   EKG:   EKG Interpretation Date/Time:  Friday December 18 2023 10:29:48 EDT Ventricular Rate:  76 PR Interval:  254 QRS  Duration:  130 QT Interval:  382 QTC Calculation: 429 R Axis:   -52  Text Interpretation: Sinus rhythm with 1st degree A-V block Left axis deviation Non-specific intra-ventricular conduction block Minimal voltage criteria for LVH, may be normal variant ( Cornell product ) When compared with ECG of 12-Dec-2018 18:40,  QRS is broader Confirmed by Gary Gabrielsen [phone]) on 12/18/2023 10:32:08 AM         Recent Labs: 04/10/2023: ALT 23; BUN 21; Creatinine 1.13; Potassium 4.5; Sodium 135 07/24/2023: Hemoglobin 13.0; Platelets 128.0  Recent Lipid Panel    Component Value Date/Time   CHOL 135 10/10/2022 1041   CHOL 142 10/26/2019 0903   TRIG 219.0 (H) 10/10/2022 1041   HDL 52.40 10/10/2022 1041   HDL 74 10/26/2019 0903   CHOLHDL 3 10/10/2022 1041   VLDL 43.8 (H) 10/10/2022 1041   LDLCALC 53 09/06/2021 0915   LDLCALC 56 08/07/2020 0906   LDLDIRECT 43.0 10/10/2022 1041  08/07/2023 hemoglobin A1c 6.4%   Risk Assessment/Calculations:                Physical Exam:    VS:  BP 139/85   Pulse 76   Ht 5' 7.5" (1.715 m)   Wt 183 lb 6.4 oz (83.2 kg)   SpO2 96%   BMI 28.30 kg/m     Wt Readings from Last 3 Encounters:  12/18/23 183 lb 6.4 oz (83.2 kg)  08/07/23 190 lb (86.2 kg)  07/03/23 189 lb (85.7 kg)     General: Alert, oriented x3, no distress, appears younger than stated age Head: no evidence of trauma, PERRL, EOMI, no exophtalmos or lid lag, no myxedema, no xanthelasma; normal ears, nose and oropharynx Neck: normal jugular venous pulsations and no hepatojugular reflux; brisk carotid pulses without delay and no carotid bruits Chest: clear to auscultation, no signs of consolidation by percussion or palpation, normal fremitus, symmetrical and full respiratory excursions Cardiovascular: normal position and quality of the apical impulse,  regular rhythm, normal first and second heart sounds, no murmurs, rubs or gallops Abdomen: no tenderness or distention, no masses by palpation, no abnormal pulsatility or arterial bruits, normal bowel sounds, no hepatosplenomegaly Extremities: no clubbing, cyanosis or edema; 2+ radial, ulnar and brachial pulses bilaterally; 2+ right femoral, posterior tibial and dorsalis pedis pulses; 2+ left femoral, posterior tibial and dorsalis pedis pulses; no subclavian or femoral bruits Neurological: grossly nonfocal Psych: Normal mood and affect   ASSESSMENT:    1. Coronary artery disease of bypass graft of native heart with stable angina pectoris (HCC)   2. Chronic combined systolic (congestive) and diastolic (congestive) heart failure (HCC)   3. Hypercholesterolemia   4. Type 2 diabetes mellitus with complication, without long-term current use of insulin (HCC)   5. Essential hypertension   6. CKD (chronic kidney disease) stage 2, GFR 60-89 ml/min   7. Erectile disorder due to medical condition in male   8. Monoclonal gammopathy of unknown significance (MGUS)    PLAN:    In order of problems listed above:  CAD: His episodes of chest discomfort are extremely brief, but do remind him of his previous coronary symptoms.  Hard to say whether it is truly angina, but symptoms are quite mild. Continue carvedilol and ranolazine.  Has a few territories downstream of occluded vessels that are not amenable to revascularization which can cause angina pectoris.  Avoiding long-acting nitrates due to use of PDE 5 inhibitors.  He is on aspirin, statin.. CHF: NYHA functional class I, clinically euvolemic.  LVEF has been consistently estimated to be abnormal, although only mildly depressed by echo and moderately depressed by LV angiography.  His echocardiogram from 2020 showed clear evidence of elevated mean left atrial pressure with pseudonormal mitral inflow pattern and E/e'>10.  On carvedilol, Jardiance and  benazepril.  We have discussed transitioning to [facility] but the cost as prevented [place] from making that change.  If she becomes symptomatic, and spironolactone. HLP: Excellent lipid profile last year.  Continue rosuvastatin and ezetimibe.  Will have labs repeated in May. DM: Markedly improved control.  She is currently on Ozempic as well as metformin and Jardiance and Prandin. HTN: I daily want his blood pressure less than 130/80.  Increase carvedilol to 6.25 mg twice daily.  I asked him to avoid taking the meloxicam or other NSAIDs except when really necessary. CKD2: Creatinine has been staying better these last couple of years. ED: Occasionally uses PDE 5 inhibitors.  He is aware he should never combine this with nitroglycerin. MGUS:  IgG lambda monoclonal spike.  He has had bilateral carpal tunnel syndrome, but this has been going on for 15 years is probably not from amyloidosis.  He has no other symptoms to suggest amyloidosis.  No evidence of low voltage on ECG.  Very mild LVH on echo.  Mitral annulus velocities are only mildly decreased.           Medication Adjustments/Labs and Tests Ordered: Current medicines are reviewed at length with the patient today.  Concerns regarding medicines are outlined above.  Orders Placed This Encounter  Procedures   EKG 12-Lead   Meds ordered this encounter  Medications   nitroGLYCERIN (NITROSTAT) 0.4 MG SL tablet    Sig: Place 1 tablet (0.4 mg total) under the tongue every 5 (five) minutes as needed for chest pain.    Dispense:  25 tablet    Refill:  1    Patient Instructions  Medication Instructions:  Your physician recommends that you continue on your current medications as directed. Please refer to the Current Medication list given to you today.  *If you need a refill on your cardiac medications before your next appointment, please call your pharmacy*   Lab Work: None ordered If you have labs (blood work) drawn today and your tests are  completely normal, you will receive your results only by: MyChart Message (if you have MyChart) OR A paper copy in the mail If you have any lab test that is abnormal or we need to change your treatment, we will call you to review the results.   Testing/Procedures: None ordered   Follow-Up: At [facility], you and your health needs are our priority.  As part of our continuing mission to provide you with exceptional heart care, we have created designated Provider Care Teams.  These Care Teams include your primary Cardiologist (physician) and Advanced Practice Providers (APPs -  Physician Assistants and Nurse Practitioners) who all work together to provide you with the care you need, when you need it.  Your next appointment:   1 year(s)  The format for your next appointment:   In Person  Provider:   Dr. Royann Shivers    Thank you for choosing [facility]!!   [phone]             Signed, Thurmon Fair, MD  12/19/2023 2:03 PM     [facility]

## 2023-12-22 ENCOUNTER — Other Ambulatory Visit: Payer: Self-pay | Admitting: Cardiovascular Disease

## 2024-01-04 ENCOUNTER — Telehealth: Payer: Self-pay

## 2024-01-04 DIAGNOSIS — D508 Other iron deficiency anemias: Secondary | ICD-10-CM

## 2024-01-04 NOTE — Telephone Encounter (Signed)
 MyChart message to patient to go to the lab one Hass this month

## 2024-01-04 NOTE — Telephone Encounter (Signed)
-----   Message from [facility] sent at 07/27/2023  8:41 AM EDT ----- Regarding: labs Due for CBC and IBC Ferretin in  12-2023

## 2024-01-22 ENCOUNTER — Other Ambulatory Visit (INDEPENDENT_AMBULATORY_CARE_PROVIDER_SITE_OTHER)

## 2024-01-22 ENCOUNTER — Encounter: Payer: Self-pay | Admitting: Podiatry

## 2024-01-22 ENCOUNTER — Ambulatory Visit: Payer: Medicare HMO | Admitting: Podiatry

## 2024-01-22 ENCOUNTER — Encounter: Payer: Self-pay | Admitting: Gastroenterology

## 2024-01-22 DIAGNOSIS — M79674 Pain in right toe(s): Secondary | ICD-10-CM

## 2024-01-22 DIAGNOSIS — E118 Type 2 diabetes mellitus with unspecified complications: Secondary | ICD-10-CM

## 2024-01-22 DIAGNOSIS — B351 Tinea unguium: Secondary | ICD-10-CM | POA: Diagnosis not present

## 2024-01-22 DIAGNOSIS — M79675 Pain in left toe(s): Secondary | ICD-10-CM | POA: Diagnosis not present

## 2024-01-22 DIAGNOSIS — D508 Other iron deficiency anemias: Secondary | ICD-10-CM

## 2024-01-22 LAB — IBC + FERRITIN
Ferritin: 36.1 ng/mL (ref 22.0–322.0)
Iron: 67 ug/dL (ref 42–165)
Saturation Ratios: 14.1 % — ABNORMAL LOW (ref 20.0–50.0)
TIBC: 474.6 ug/dL — ABNORMAL HIGH (ref 250.0–450.0)
Transferrin: 339 mg/dL (ref 212.0–360.0)

## 2024-01-22 LAB — CBC WITH DIFFERENTIAL/PLATELET
Basophils Absolute: 0 10*3/uL (ref 0.0–0.1)
Basophils Relative: 0.8 % (ref 0.0–3.0)
Eosinophils Absolute: 0 10*3/uL (ref 0.0–0.7)
Eosinophils Relative: 0.9 % (ref 0.0–5.0)
HCT: 36.9 % — ABNORMAL LOW (ref 39.0–52.0)
Hemoglobin: 12.6 g/dL — ABNORMAL LOW (ref 13.0–17.0)
Lymphocytes Relative: 36.8 % (ref 12.0–46.0)
Lymphs Abs: 1.5 10*3/uL (ref 0.7–4.0)
MCHC: 34 g/dL (ref 30.0–36.0)
MCV: 88.5 fl (ref 78.0–100.0)
Monocytes Absolute: 0.5 10*3/uL (ref 0.1–1.0)
Monocytes Relative: 11 % (ref 3.0–12.0)
Neutro Abs: 2.1 10*3/uL (ref 1.4–7.7)
Neutrophils Relative %: 50.5 % (ref 43.0–77.0)
Platelets: 156 10*3/uL (ref 150.0–400.0)
RBC: 4.17 Mil/uL — ABNORMAL LOW (ref 4.22–5.81)
RDW: 14 % (ref 11.5–15.5)
WBC: 4.1 10*3/uL (ref 4.0–10.5)

## 2024-01-22 NOTE — Progress Notes (Signed)
 This patient returns to my office for at risk foot care.  This patient requires this care by a professional since this patient will be at risk due to having This patient is unable to cut nails himself since the patient cannot reach his nails.These nails are painful walking and wearing shoes.  This patient presents for at risk foot care today.  General Appearance  Alert, conversant and in no acute stress.  Vascular  Dorsalis pedis and posterior tibial  pulses are palpable  bilaterally.  Capillary return is within normal limits  bilaterally. Temperature is within normal limits  bilaterally.  Neurologic  Senn-Weinstein monofilament wire test within normal limits  bilaterally. Muscle power within normal limits bilaterally.  Nails Thick disfigured discolored nails with subungual debris  from hallux to fifth toes bilaterally. No evidence of bacterial infection or drainage bilaterally.  Orthopedic  No limitations of motion  feet .  No crepitus or effusions noted.  No bony pathology or digital deformities noted.  Skin  normotropic skin with no porokeratosis noted bilaterally.  No signs of infections or ulcers noted.     Onychomycosis  Pain in right toes  Pain in left toes  Consent was obtained for treatment procedures.   Mechanical debridement of nails 1-5  bilaterally performed with a nail nipper.  Filed with dremel without incident.    Return office visit     3 months                Told patient to return for periodic foot care and evaluation due to potential at risk complications.   Helane Gunther DPM

## 2024-01-27 ENCOUNTER — Other Ambulatory Visit: Payer: Self-pay

## 2024-01-27 MED ORDER — CARVEDILOL 3.125 MG PO TABS: 3.1250 mg | ORAL_TABLET | Freq: Two times a day (BID) | ORAL | 3 refills | Status: DC

## 2024-02-04 ENCOUNTER — Other Ambulatory Visit: Payer: Self-pay | Admitting: Internal Medicine

## 2024-02-05 ENCOUNTER — Ambulatory Visit: Payer: Medicare HMO | Admitting: Internal Medicine

## 2024-02-09 ENCOUNTER — Encounter (INDEPENDENT_AMBULATORY_CARE_PROVIDER_SITE_OTHER): Admitting: Family Medicine

## 2024-02-09 ENCOUNTER — Encounter: Payer: Self-pay | Admitting: Family Medicine

## 2024-02-09 NOTE — Progress Notes (Signed)
 Error - [place] Can't do telemed visit today. Requested reschedule.

## 2024-02-09 NOTE — Progress Notes (Signed)
 Patient was unable to self-report due to a lack of equipment at home via telehealth

## 2024-02-23 ENCOUNTER — Other Ambulatory Visit: Payer: Self-pay | Admitting: Cardiovascular Disease

## 2024-02-23 ENCOUNTER — Ambulatory Visit (INDEPENDENT_AMBULATORY_CARE_PROVIDER_SITE_OTHER): Admitting: Family Medicine

## 2024-02-23 DIAGNOSIS — Z0000 Encounter for general adult medical examination without abnormal findings: Secondary | ICD-10-CM

## 2024-02-23 NOTE — Patient Instructions (Signed)
 I really enjoyed getting to talk with you today! I am available on Tuesdays and Thursdays for virtual visits if you have any questions or concerns, or if I can be of any further assistance.   CHECKLIST FROM ANNUAL WELLNESS VISIT:  -Follow up (please call to schedule if not scheduled after visit):   -in office visit with Dr. Alyne Babinski in the next 1-2 months for follow up and labs   -yearly for annual wellness visit with primary care office  Here is a list of your preventive care/health maintenance measures and the plan for each if any are due:  PLAN For any measures below that may be due:   Please have your eye doctor send the eye report to Dr. Alyne Babinski  2. Please sent foot report to Dr. Alyne Babinski once completed  3. Please schedule in office visit in the next month or two for follow up and to check labs  4. Can get vaccines at the pharmacy, please let us  know if you do so that we can update your record.   Health Maintenance  Topic Date Due   OPHTHALMOLOGY EXAM  07/30/2023   FOOT EXAM  01/16/2024   HEMOGLOBIN A1C  02/04/2024   COVID-19 Vaccine (10 - Pfizer risk 2024-25 season) 02/25/2024 (Originally 01/16/2024)   Diabetic kidney evaluation - eGFR measurement  04/09/2024   INFLUENZA VACCINE  04/29/2024   Diabetic kidney evaluation - Urine ACR  07/02/2024   Medicare Annual Wellness (AWV)  02/22/2025   Colonoscopy  03/26/2028   DTaP/Tdap/Td (4 - Td or Tdap) 11/04/2031   Pneumonia Vaccine 6+ Years old  Completed   Hepatitis C Screening  Completed   Zoster Vaccines- Shingrix  Completed   HPV VACCINES  Aged Out   Meningococcal B Vaccine  Aged Out    -See a dentist at least yearly  -Get your eyes checked and then per your eye specialist's recommendations  -Other issues addressed today:   -I have included below further information regarding a healthy whole foods based diet, physical activity guidelines for adults, stress management and opportunities for social connections. I hope you find this  information useful.   -----------------------------------------------------------------------------------------------------------------------------------------------------------------------------------------------------------------------------------------------------------    NUTRITION: -eat real food: lots of colorful vegetables (half the plate) and fruits -5-7 servings of vegetables and fruits per [place] (fresh or steamed is best), exp. 2 servings of vegetables with lunch and dinner and 2 servings of fruit per [place]. Berries and greens such as kale and collards are great choices.  -consume on a regular basis:  fresh fruits, fresh veggies, fish, nuts, seeds, healthy oils (such as olive oil, avocado oil), whole grains (make sure for bread/pasta/crackers/etc., that the first ingredient on label contains the word "whole"), legumes. -can eat small amounts of dairy and lean meat (no larger than the palm of your hand), but avoid processed meats such as ham, bacon, lunch meat, etc. -drink water -try to avoid fast food and pre-packaged foods, processed meat, ultra processed foods/beverages (donuts, candy, etc.) -most experts advise limiting sodium to < 2300mg  per [place], should limit further is any chronic conditions such as high blood pressure, heart disease, diabetes, etc. The American Heart Association advised that < 1500mg  is is ideal -try to avoid foods/beverages that contain any ingredients with names you do not recognize  -try to avoid foods/beverages  with added sugar or sweeteners/sweets  -try to avoid sweet drinks (including diet drinks): soda, juice, Gatorade, sweet tea, power drinks, diet drinks -try to avoid white rice, white bread, pasta (unless whole  grain)  EXERCISE GUIDELINES FOR ADULTS: -if you wish to increase your physical activity, do so gradually and with the approval of your doctor -STOP and seek medical care immediately if you have any chest pain, chest discomfort or trouble breathing  when starting or increasing exercise  -move and stretch your body, legs, feet and arms when sitting for long periods -Physical activity guidelines for optimal health in adults: -get at least 150 minutes per week of moderate exercise (can talk, but not sing); this is about 20-30 minutes of sustained activity 5-7 days per week or two 10-15 minute episodes of sustained activity 5-7 days per week -do some muscle building/resistance training/strength training at least 2 days per week  -balance exercises 3+ days per week:   Stand somewhere where you have something sturdy to hold onto if you lose balance    1) lift up on toes, then back down, start with 5x per [place] and work up to 20x   2) stand and lift one leg straight out to the side so that foot is a few inches of the floor, start with 5x each side and work up to 20x each side   3) stand on one foot, start with 5 seconds each side and work up to 20 seconds on each side  If you need ideas or help with getting more active:  -Silver sneakers https://tools.silversneakers.com  -Walk with a Doc: http://www.duncan-[email or web address]/  -try to include resistance (weight lifting/strength building) and balance exercises twice per week: or the following link for ideas: http://castillo-powell.com/  BuyDucts.dk  STRESS MANAGEMENT: -can try meditating, or just sitting quietly with deep breathing while intentionally relaxing all parts of your body for 5 minutes daily -if you need further help with stress, anxiety or depression please follow up with your primary doctor or contact the wonderful folks at [facility]: [phone]  SOCIAL CONNECTIONS: -options in [place] if you wish to engage in more social and exercise related activities:  -Silver sneakers https://tools.silversneakers.com  -Walk with a Doc: http://www.duncan-[email or web address]/  -Check out the [facility]  Active Adults 50+ section on the [place] of [employer] (hiking clubs, book clubs, cards and games, chess, exercise classes, aquatic classes and much more) - see the website for details: https://www.[place].gov/departments/parks-recreation/active-adults50  -YouTube has lots of exercise videos for different ages and abilities as well  -[facility] (a variety of indoor and outdoor inperson activities for adults). [phone]. [address]  -Virtual Online Classes (a variety of topics): see seniorplanet.org or call [phone]  -consider volunteering at a school, hospice center, church, senior center or elsewhere       ADVANCED HEALTHCARE DIRECTIVES:  [place] Advanced Directives assistance:   ExpressWeek.com.cy  Everyone should have advanced health care directives in place. This is so that you get the care you want, should you ever be in a situation where you are unable to make your own medical decisions.   From the [place] Advanced Directive Website: "Advance Health Care Directives are legal documents in which you give written instructions about your health care if, in the future, you cannot speak for yourself.   A health care power of attorney allows you to name a person you trust to make your health care decisions if you cannot make them yourself. A declaration of a desire for a natural death (or living will) is document, which states that you desire not to have your life prolonged by extraordinary measures if you have a terminal or incurable illness or if you are in  a vegetative state. An advance instruction for mental health treatment makes a declaration of instructions, information and preferences regarding your mental health treatment. It also states that you are aware that the advance instruction authorizes a mental health treatment provider to act according to your wishes. It may also outline your  consent or refusal of mental health treatment. A declaration of an anatomical gift allows anyone over the age of 16 to make a gift by will, organ donor card or other document."   Please see the following website or an elder law attorney for forms, FAQs and for completion of advanced directives: [place]  Print production planner Health Care Directives Advance Health Care Directives ([email or web address].[email or web address]/)  Or copy and paste the following to your web browser: [email or web address].fi

## 2024-02-23 NOTE — Progress Notes (Signed)
 Pre-Visit Check-in:  1)Vitals (height, wt, BP, etc) - record in vitals section for visit on [place] of visit  Request home vitals (wt, BP, etc.) and enter into vitals, THEN update Vital Signs SmartPhrase below at the top of the HPI. See below.  2)Review and Update Medications, Allergies PMH, Surgeries, Social history in Epic  3)Hospitalizations in the last year with date/reason? NO   4)Review and Update Care Team (patient's specialists) in Epic  5) Complete PHQ9 in Epic  6) Complete Fall Screening in Epic  7)Review all Health Maintenance Due and order under PCP if not done.   Medicare Wellness Patient Questionnaire:   Answer theses question about your habits:  1. How often do you have a drink containing alcohol?Yes 2-4 times per month 2. How many drinks containing alcohol do you have on a typical [place] when you are drinking?1-2  3. How often do you have six or more drinks on one occasion?Never  4. Have you ever smoked?Yes  Quit date if applicable? +15 years ago   5. How many packs a M[place] do/did you smoke? 1 pack every 2 weeks  6. Do you use smokeless tobacco?NA  7. Do you use an illicit drugs?NO  8. On average, how many days per week do you engage in moderate to strenuous exercise (like a brisk walk)?Yes, 4 times a week  9. On average, how many minutes do you engage in exercise at this level? 60 minutes at least, usually gets at least, 3 miles a [place] of walking, also does a fair amount of yard 10. Are you sexually active? Yes  Number of partners?one  11. Typical breakfast: Orange juice, toast, egg  12. Typical lunch: chicken sandwich, fruit  13. Typical dinner: steak, baked potato  broccoli, salad   14. Typical snacks: oat and honey bar  15. Beverages: Water, green tea, coffee   Answer theses question about your everyday activities:  1. Can you perform most household chores?Yes  2. Are you deaf or have significant trouble hearing?NO  3. Do you feel that you have a problem with memory?No  4.  Do you feel safe at home?Yes  5. Last dentist visit? A year ago  8. Do you have any difficulty performing your everyday activities?No  Are you having any difficulty walking, taking medications on your own, and or difficulty managing daily home needs?No  Do you have difficulty walking or climbing stairs?No  Do you have difficulty dressing or bathing?No  Do you have difficulty doing errands alone such as visiting a doctor's office or shopping?No  Do you currently have any difficulty preparing food and eating?NO  Do you currently have any difficulty using the toilet?NO  Do you have any difficulty managing your finances?NO  Do you have any difficulties with housekeeping of managing your housekeeping?No    Do you have Advanced Directives in place (Living Will, Healthcare Power or Attorney)? No, he is working on this   Last eye Exam and location? [place] 2025[place]    Do you currently use prescribed or non-prescribed narcotic or opioid pain medications?No   Do you have a history or close family history of breast, ovarian, tubal or peritoneal cancer or a family member with BRCA (breast cancer susceptibility 1 and 2) gene mutations?NO   Request home vitals (wt, BP, etc.) and enter into vitals, THEN update Vital Signs SmartPhrase below at the top of the HPI. See below.   Nurse/Assistant Credentials/time stamp: Georga Killings CMA 2:55 pm  02/09/2024, reviewed and confirmed with Dr. Burdette Carolin on 02/23/2024 ----------------------------------------------------------------------------------------------------------------------------------------------------------------------------------------------------------------------  Because this visit was a virtual/telehealth visit, some criteria may be missing or patient reported. Any vitals not documented were not able to be obtained and vitals that have been documented are patient reported.    MEDICARE ANNUAL PREVENTIVE CARE VISIT WITH  PROVIDER (Welcome to Medicare, initial annual wellness or annual wellness exam)  Virtual Visit via Phone Note  I connected with Regis E Heathcock on 02/23/24  by phone and verified that I am speaking with the correct person using two identifiers. He prefers by phone.   Location patient:[place] Location provider:work or home office Persons participating in the virtual visit: patient, provider  Concerns and/or follow up today: reports all is stable. Has been on Ozempic  and feels has lost a few pounds.    See HM section in Epic for other details of completed HM.    ROS: negative for report of fevers, unintentional weight loss, vision changes, vision loss, hearing loss or change, chest pain, sob, hemoptysis, melena, hematochezia, hematuria, falls, bleeding or bruising, thoughts of suicide or self harm, memory loss  Patient-completed extensive health risk assessment - reviewed and discussed with the patient: See Health Risk Assessment completed with patient prior to the visit either above or in recent phone note. This was reviewed in detailed with the patient today and appropriate recommendations, orders and referrals were placed as needed per Summary below and patient instructions.   Review of Medical History: -PMH, PSH, Family History and current specialty and care providers reviewed and updated and listed below   Patient Care Team: Donley Furth, MD as PCP - General (Family Medicine) Arty Binning, MD (Inactive) as PCP - Cardiology (Cardiology) Lindle Rhea, MD (Inactive) as Consulting Physician (Gastroenterology) Gwyndolyn Lerner, MD (Inactive) as Consulting Physician (Endocrinology) Ander Bame, MD as Consulting Physician (Hematology and Oncology) Alver Austin, [facility] (Inactive) as Pharmacist (Pharmacist)   Past Medical History:  Diagnosis Date   Carpal tunnel syndrome    both hands   Colon polyps    Coronary artery disease    sees Dr. Alvis Ba   Diabetes mellitus    sees  Dr. Augustina Lecher   GERD (gastroesophageal reflux disease)    History of echocardiogram    a. Echo 4/17: Moderate LVH, EF 50-55%, mild LAE, no pericardial effusion   Hyperlipidemia    Hypertension    MGUS (monoclonal gammopathy of unknown significance)    sees Dr. Amparo Balk   Neuropathy    gets foot exams with Dr. Wilnette Haste (podiatry)   Pneumonia    Rotator cuff injury     Past Surgical History:  Procedure Laterality Date   CARDIAC CATHETERIZATION N/A 11/21/2015   Procedure: Left Heart Cath and Coronary Angiography;  Surgeon: Arty Binning, MD;  Location: [facility] INVASIVE CV LAB;  Service: Cardiovascular;  Laterality: N/A;   COLONOSCOPY  02/23/2020   per Dr. Savannah Curlin, tubular adenomas, repeat in 3 yrs    colonsocopy  12/07/2009   per Dr. Dellis Fermo, benign polyps, repeat in 10 yrs    CORONARY ARTERY BYPASS GRAFT N/A 11/22/2015   Procedure: CORONARY ARTERY BYPASS GRAFTING (CABG) times five using the left internal mammary, right greater saphenous vein EVH, and left thigh greater saphenous vein EVH;  Surgeon: Heriberto London, MD;  Location: [facility] OR;  Service: Open Heart Surgery;  Laterality: N/A;   CORONARY PRESSURE/FFR STUDY N/A 12/14/2018   Procedure: INTRAVASCULAR PRESSURE WIRE/FFR STUDY;  Surgeon: Lucendia Rusk, MD;  Location: [name];  Service: Cardiovascular;  Laterality: N/A;   frozen shoulder release Right    LEFT HEART CATH AND CORS/GRAFTS ANGIOGRAPHY N/A 10/27/2017   Procedure: LEFT HEART CATH AND CORS/GRAFTS ANGIOGRAPHY;  Surgeon: Arty Binning, MD;  Location: [name];  Service: Cardiovascular;  Laterality: N/A;   LEFT HEART CATH AND CORS/GRAFTS ANGIOGRAPHY N/A 12/14/2018   Procedure: LEFT HEART CATH AND CORS/GRAFTS ANGIOGRAPHY;  Surgeon: Lucendia Rusk, MD;  Location: [facility] INVASIVE CV LAB;  Service: Cardiovascular;  Laterality: N/A;   TEE WITHOUT CARDIOVERSION N/A 11/22/2015   Procedure: TRANSESOPHAGEAL ECHOCARDIOGRAM (TEE);  Surgeon: Heriberto London,  MD;  Location: [facility] OR;  Service: Open Heart Surgery;  Laterality: N/A;   WRIST GANGLION EXCISION Right     Social History   Socioeconomic History   Marital status: Married    Spouse name: Not on file   Number of children: 5   Years of education: Not on file   Highest education level: GED or equivalent  Occupational History   Occupation: Dispatcher  Tobacco Use   Smoking status: Former    Current packs/[place]: 0.00    Average packs/[place]: 0.3 packs/Enloe for 20.0 years (5.0 ttl pk-yrs)    Types: Cigarettes    Start date: 10/07/1993    Quit date: 10/07/2013    Years since quitting: 10.3   Smokeless tobacco: Never   Tobacco comments:    quit cigarettes  Vaping Use   Vaping status: Never Used  Substance and Sexual Activity   Alcohol use: Yes    Alcohol/week: 3.0 - 4.0 standard drinks of alcohol    Types: 3 - 4 Standard drinks or equivalent per week   Drug use: No   Sexual activity: Not Currently    Birth control/protection: None  Other Topics Concern   Not on file  Social History Narrative   Originally from [place], [place]   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)   Married   5 kids   6 grand, 2 great grand   Social Drivers of Health   Financial Resource Strain: Low Risk  (02/22/2024)   Overall Financial Resource Strain (CARDIA)    Difficulty of Paying Living Expenses: Not very hard  Food Insecurity: No Food Insecurity (02/22/2024)   Hunger Vital Sign    Worried About Running Out of Food in the Last Year: Never true    Ran Out of Food in the Last Year: Never true  Transportation Needs: No Transportation Needs (02/22/2024)   PRAPARE - Administrator, Civil Service (Medical): No    Lack of Transportation (Non-Medical): No  Physical Activity: Sufficiently Active (02/22/2024)   Exercise Vital Sign    Days of Exercise per Week: 4 days    Minutes of Exercise per Session: 60 min  Stress: No Stress Concern Present (02/22/2024)   [employer] of  Occupational Health - Occupational Stress Questionnaire    Feeling of Stress : Not at all  Social Connections: Socially Integrated (02/22/2024)   Social Connection and Isolation Panel [NHANES]    Frequency of Communication with Friends and Family: More than three times a week    Frequency of Social Gatherings with Friends and Family: More than three times a week    Attends Religious Services: More than 4 times per year    Active Member of [employer] or Organizations: Yes    Attends Engineer, structural: More than 4 times per year    Marital Status: Married  Intimate Partner Violence: Not At Risk (12/12/2022)   Humiliation, Afraid, Rape, and Kick questionnaire    Fear of Current or Ex-Partner: No    Emotionally Abused: No    Physically Abused: No    Sexually Abused: No    Family History  Problem Relation Age of Onset   Heart failure Father    Hyperlipidemia Sister    Diabetes Sister    Hypertension Sister    Kidney failure Sister    Hyperlipidemia Sister    Diabetes Sister    Colon polyps Sister    Kidney disease Sister    Anuerysm Brother        AAA   Other Brother        COVID 19   Diabetes Maternal Grandmother    Diabetes Son    Heart disease Son    Heart attack Neg Hx    Colon cancer Neg Hx    Stomach cancer Neg Hx    Esophageal cancer Neg Hx    Pancreatic cancer Neg Hx     Current Outpatient Medications on File Prior to Visit  Medication Sig Dispense Refill   Accu-Chek Softclix Lancets lancets USE IN THE MORNING, AT NOON, AND AT BEDTIME. 100 each 11   acetaminophen  (TYLENOL ) 500 MG tablet Take 500 mg by mouth as needed.     APPLE CIDER VINEGAR PO Take by mouth daily.      Ascorbic Acid (VITAMIN C PO) Take 1 tablet by mouth daily.     aspirin  EC 81 MG tablet Take 1 tablet (81 mg total) by mouth daily.     benazepril  (LOTENSIN ) 20 MG tablet TAKE 1 TABLET EVERY Poth 30 tablet 11   Blood Glucose Monitoring Suppl (ACCU-CHEK GUIDE) w/Device KIT USE AS DIRECTED 1 kit  3   Blood Glucose Monitoring Suppl DEVI 1 each by Does not apply route in the morning, at noon, and at bedtime. May substitute to any manufacturer covered by patient's insurance. ACCU CHECK GUIDE 1 each 0   carvedilol  (COREG ) 3.125 MG tablet Take 1 tablet (3.125 mg total) by mouth 2 (two) times daily with a meal. 180 tablet 3   Cinnamon 500 MG capsule Take 1 capsule by mouth daily.     Coenzyme Q10 (COQ-10) 100 MG CAPS Take 1 tablet by mouth daily.     Cyanocobalamin  (VITAMIN B-12 PO) Take 1 tablet by mouth daily.     ezetimibe  (ZETIA ) 10 MG tablet Take 1 tablet (10 mg total) by mouth daily. 90 tablet 2   ferrous sulfate 325 (65 FE) MG tablet Take 325 mg by mouth daily with breakfast.     Garlic 1000 MG CAPS Take 1 tablet by mouth daily.     glucose blood (ACCU-CHEK GUIDE TEST) test strip TEST BLOOD SUGAR IN THE MORNING, AT NOON AND AT BEDTIME 200 strip 1   glucose blood (ACCU-CHEK GUIDE) test strip 1 each by Other route in the morning and at bedtime. Use as instructed 200 each 3   Glucose Blood (BLOOD GLUCOSE TEST STRIPS) STRP 1 each by In Vitro route in the morning, at noon, and at bedtime. May substitute to any manufacturer covered by patient's insurance. 100 strip 0   meloxicam (MOBIC) 15 MG tablet Take 1 tablet by mouth daily as needed.     metFORMIN  (GLUCOPHAGE -XR) 500 MG 24 hr tablet Take 4 tablets (2,000 mg total) by mouth daily. 360 tablet 3   Multiple Vitamin (MULTIVITAMIN) tablet Take 1 tablet by mouth daily.  nitroGLYCERIN  (NITROSTAT ) 0.4 MG SL tablet Place 1 tablet (0.4 mg total) under the tongue every 5 (five) minutes as needed for chest pain. 25 tablet 1   Omega-3 Fatty Acids (FISH OIL PO) Take 1 capsule by mouth daily.     pantoprazole  (PROTONIX ) 40 MG tablet Take 1 tablet (40 mg total) by mouth 2 (two) times daily. Please schedule an office visit for further refills. Thank you (Patient not taking: Reported on 02/09/2024) 180 tablet 0   ranolazine  (RANEXA ) 500 MG 12 hr tablet Take  1 tablet (500 mg total) by mouth 2 (two) times daily. 180 tablet 1   repaglinide  (PRANDIN ) 0.5 MG tablet Take 1 tablet (0.5 mg total) by mouth 2 (two) times daily before a meal. 180 tablet 3   rosuvastatin  (CRESTOR ) 40 MG tablet TAKE 1 TABLET EVERY Dulude 90 tablet 3   Semaglutide ,0.25 or 0.5MG /DOS, 2 MG/3ML SOPN Inject 0.5 mg into the skin once a week. 9 mL 3   sildenafil  (VIAGRA ) 100 MG tablet TAKE 1 TABLET BY MOUTH AS NEEDED FOR ERECTILE DYSFUNCTION 30 tablet 5   tadalafil  (CIALIS ) 20 MG tablet TAKE 1 TABLET BY MOUTH AS NEEDED FOR ERECTILE DYSFUNCTION 20 tablet 5   VITAMIN E PO Take 1 capsule by mouth daily.     No current facility-administered medications on file prior to visit.    No Known Allergies     Physical Exam Vitals requested from patient and listed below if patient had equipment and was able to obtain at home for this virtual visit: There were no vitals filed for this visit. Estimated body mass index is 28.3 kg/m as calculated from the following:   Height as of 12/18/23: 5' 7.5" (1.715 m).   Weight as of 12/18/23: 183 lb 6.4 oz (83.2 kg).  EKG (optional): deferred due to virtual visit  GENERAL: alert, oriented, no acute distress detected; full vision exam deferred due to pandemic and/or virtual encounter  PSYCH/NEURO: pleasant and cooperative, no obvious depression or anxiety, speech and thought processing grossly intact, Cognitive function grossly intact  Flowsheet Row Erroneous Encounter from 02/09/2024 in [facility]  PHQ-9 Total Score 0           02/23/2024    1:04 PM 02/09/2024    2:42 PM 05/01/2023    8:57 AM 12/12/2022   11:42 AM 10/10/2022   10:07 AM  Depression screen PHQ 2/9  Decreased Interest 0 0 0 0 0  Down, Depressed, Hopeless 0 0 0 0 0  PHQ - 2 Score 0 0 0 0 0  Altered sleeping  0 0    Tired, decreased energy  0 0    Change in appetite  0 0    Feeling bad or failure about yourself   0 0    Trouble concentrating  0 0     Moving slowly or fidgety/restless  0 0    Suicidal thoughts  0 0    PHQ-9 Score  0 0         05/01/2023    8:57 AM 02/05/2024   10:33 AM 02/09/2024    2:42 PM 02/22/2024    9:56 PM 02/23/2024    8:31 AM  Fall Risk  Falls in the past year? 1 0 0 0 0  Was there an injury with Fall? 0 0 0 0 0  Fall Risk Category Calculator 1 0  0 0  0  Patient at Risk for Falls Due to Other (Comment)  No  Fall Risks    Fall risk Follow up Falls evaluation completed  Falls evaluation completed  Falls evaluation completed;Education provided     Patient-reported     SUMMARY AND PLAN:  Encounter for Medicare annual wellness exam   Discussed applicable health maintenance/preventive health measures and advised and referred or ordered per patient preferences: -reports had eye exam in March and reports will ask his eye doctor to send report to Dr. Alyne Babinski -reports foot exam is scheduled with his foot doc -plans to see Dr. Alyne Babinski in office for labs/f/u - sent message to schedulers to assist in scheduling Health Maintenance  Topic Date Due   OPHTHALMOLOGY EXAM  07/30/2023   FOOT EXAM  01/16/2024   HEMOGLOBIN A1C  02/04/2024   COVID-19 Vaccine (10 - Pfizer risk 2024-25 season) 02/25/2024 (Originally 01/16/2024)   Diabetic kidney evaluation - eGFR measurement  04/09/2024   INFLUENZA VACCINE  04/29/2024   Diabetic kidney evaluation - Urine ACR  07/02/2024   Medicare Annual Wellness (AWV)  02/22/2025   Colonoscopy  03/26/2028   DTaP/Tdap/Td (4 - Td or Tdap) 11/04/2031   Pneumonia Vaccine 71+ Years old  Completed   Hepatitis C Screening  Completed   Zoster Vaccines- Shingrix  Completed   HPV VACCINES  Aged Out   Meningococcal B Vaccine  Aged Out     Education and counseling on the following was provided based on the above review of health and a plan/checklist for the patient, along with additional information discussed, was provided for the patient in the patient instructions :  -Advised on importance of  completing advanced directives, discussed options for completing and provided information in patient instructions as well  -Advised and counseled on a healthy lifestyle - including the importance of a healthy diet, regular physical activity, social connections and stress management. -Reviewed patient's current diet. Advised and counseled on a whole foods based healthy diet. A summary of a healthy diet was provided in the Patient Instructions.  -reviewed patient's current physical activity level and discussed exercise guidelines for adults. Discussed community resources and ideas for safe exercise at home to assist in meeting exercise guideline recommendations in a safe and healthy way.  -Advise yearly dental visits at minimum and regular eye exams -Advised and counseled on alcohol safe limits, risks  Follow up: see patient instructions   Patient Instructions  I really enjoyed getting to talk with you today! I am available on Tuesdays and Thursdays for virtual visits if you have any questions or concerns, or if I can be of any further assistance.   CHECKLIST FROM ANNUAL WELLNESS VISIT:  -Follow up (please call to schedule if not scheduled after visit):   -in office visit with Dr. Alyne Babinski in the next 1-2 months for follow up and labs   -yearly for annual wellness visit with primary care office  Here is a list of your preventive care/health maintenance measures and the plan for each if any are due:  PLAN For any measures below that may be due:   Please have your eye doctor send the eye report to Dr. Alyne Babinski  2. Please sent foot report to Dr. Alyne Babinski once completed  3. Please schedule in office visit in the next month or two for follow up and to check labs  4. Can get vaccines at the pharmacy, please let us  know if you do so that we can update your record.   Health Maintenance  Topic Date Due   OPHTHALMOLOGY EXAM  07/30/2023   FOOT  EXAM  01/16/2024   HEMOGLOBIN A1C  02/04/2024   COVID-19 Vaccine  (10 - Pfizer risk 2024-25 season) 02/25/2024 (Originally 01/16/2024)   Diabetic kidney evaluation - eGFR measurement  04/09/2024   INFLUENZA VACCINE  04/29/2024   Diabetic kidney evaluation - Urine ACR  07/02/2024   Medicare Annual Wellness (AWV)  02/22/2025   Colonoscopy  03/26/2028   DTaP/Tdap/Td (4 - Td or Tdap) 11/04/2031   Pneumonia Vaccine 61+ Years old  Completed   Hepatitis C Screening  Completed   Zoster Vaccines- Shingrix  Completed   HPV VACCINES  Aged Out   Meningococcal B Vaccine  Aged Out    -See a dentist at least yearly  -Get your eyes checked and then per your eye specialist's recommendations  -Other issues addressed today:   -I have included below further information regarding a healthy whole foods based diet, physical activity guidelines for adults, stress management and opportunities for social connections. I hope you find this information useful.   -----------------------------------------------------------------------------------------------------------------------------------------------------------------------------------------------------------------------------------------------------------    NUTRITION: -eat real food: lots of colorful vegetables (half the plate) and fruits -5-7 servings of vegetables and fruits per [place] (fresh or steamed is best), exp. 2 servings of vegetables with lunch and dinner and 2 servings of fruit per [place]. Berries and greens such as kale and collards are great choices.  -consume on a regular basis:  fresh fruits, fresh veggies, fish, nuts, seeds, healthy oils (such as olive oil, avocado oil), whole grains (make sure for bread/pasta/crackers/etc., that the first ingredient on label contains the word "whole"), legumes. -can eat small amounts of dairy and lean meat (no larger than the palm of your hand), but avoid processed meats such as ham, bacon, lunch meat, etc. -drink water -try to avoid fast food and pre-packaged foods, processed  meat, ultra processed foods/beverages (donuts, candy, etc.) -most experts advise limiting sodium to < 2300mg  per [place], should limit further is any chronic conditions such as high blood pressure, heart disease, diabetes, etc. The American Heart Association advised that < 1500mg  is is ideal -try to avoid foods/beverages that contain any ingredients with names you do not recognize  -try to avoid foods/beverages  with added sugar or sweeteners/sweets  -try to avoid sweet drinks (including diet drinks): soda, juice, Gatorade, sweet tea, power drinks, diet drinks -try to avoid white rice, white bread, pasta (unless whole grain)  EXERCISE GUIDELINES FOR ADULTS: -if you wish to increase your physical activity, do so gradually and with the approval of your doctor -STOP and seek medical care immediately if you have any chest pain, chest discomfort or trouble breathing when starting or increasing exercise  -move and stretch your body, legs, feet and arms when sitting for long periods -Physical activity guidelines for optimal health in adults: -get at least 150 minutes per week of moderate exercise (can talk, but not sing); this is about 20-30 minutes of sustained activity 5-7 days per week or two 10-15 minute episodes of sustained activity 5-7 days per week -do some muscle building/resistance training/strength training at least 2 days per week  -balance exercises 3+ days per week:   Stand somewhere where you have something sturdy to hold onto if you lose balance    1) lift up on toes, then back down, start with 5x per [place] and work up to 20x   2) stand and lift one leg straight out to the side so that foot is a few inches of the floor, start with 5x each side and work up to  20x each side   3) stand on one foot, start with 5 seconds each side and work up to 20 seconds on each side  If you need ideas or help with getting more active:  -Silver sneakers https://tools.silversneakers.com  -Walk with a  Doc: http://www.duncan-williams.com/  -try to include resistance (weight lifting/strength building) and balance exercises twice per week: or the following link for ideas: http://castillo-powell.com/  BuyDucts.dk  STRESS MANAGEMENT: -can try meditating, or just sitting quietly with deep breathing while intentionally relaxing all parts of your body for 5 minutes daily -if you need further help with stress, anxiety or depression please follow up with your primary doctor or contact the wonderful folks at [facility]: [phone]  SOCIAL CONNECTIONS: -options in [place] if you wish to engage in more social and exercise related activities:  -Silver sneakers https://tools.silversneakers.com  -Walk with a Doc: http://www.duncan-williams.com/  -Check out the [facility] Active Adults 50+ section on the [place] [employer] (hiking clubs, book clubs, cards and games, chess, exercise classes, aquatic classes and much more) - see the website for details: https://www.[place].gov/departments/parks-recreation/active-adults50  -YouTube has lots of exercise videos for different ages and abilities as well  -[facility] (a variety of indoor and outdoor inperson activities for adults). [phone]. [address]  -Virtual Online Classes (a variety of topics): see seniorplanet.org or call [phone]  -consider volunteering at a school, hospice center, church, senior center or elsewhere       ADVANCED HEALTHCARE DIRECTIVES:  [place] Advanced Directives assistance:   ExpressWeek.com.cy  Everyone should have advanced health care directives in place. This is so that you get the care you want, should you ever be in a situation where you are unable to make your own medical decisions.   From the [place] Advanced Directive  Website: "Advance Health Care Directives are legal documents in which you give written instructions about your health care if, in the future, you cannot speak for yourself.   A health care power of attorney allows you to name a person you trust to make your health care decisions if you cannot make them yourself. A declaration of a desire for a natural death (or living will) is document, which states that you desire not to have your life prolonged by extraordinary measures if you have a terminal or incurable illness or if you are in a vegetative state. An advance instruction for mental health treatment makes a declaration of instructions, information and preferences regarding your mental health treatment. It also states that you are aware that the advance instruction authorizes a mental health treatment provider to act according to your wishes. It may also outline your consent or refusal of mental health treatment. A declaration of an anatomical gift allows anyone over the age of 35 to make a gift by will, organ donor card or other document."   Please see the following website or an elder law attorney for forms, FAQs and for completion of advanced directives: [place]  Print production planner Health Care Directives Advance Health Care Directives (http://guzman.com/)  Or copy and paste the following to your web browser: PoshChat.fi       Maurie Southern, DO

## 2024-02-26 ENCOUNTER — Other Ambulatory Visit: Payer: Self-pay | Admitting: Gastroenterology

## 2024-02-26 DIAGNOSIS — K219 Gastro-esophageal reflux disease without esophagitis: Secondary | ICD-10-CM

## 2024-03-04 ENCOUNTER — Other Ambulatory Visit: Payer: Self-pay | Admitting: Family Medicine

## 2024-03-04 ENCOUNTER — Other Ambulatory Visit: Payer: Self-pay | Admitting: Internal Medicine

## 2024-03-11 ENCOUNTER — Encounter: Payer: Self-pay | Admitting: Internal Medicine

## 2024-03-16 ENCOUNTER — Other Ambulatory Visit: Payer: Self-pay

## 2024-03-16 MED ORDER — EZETIMIBE 10 MG PO TABS: 10.0000 mg | ORAL_TABLET | Freq: Every day | ORAL | 2 refills | Status: DC

## 2024-03-18 ENCOUNTER — Ambulatory Visit: Admitting: Internal Medicine

## 2024-03-18 ENCOUNTER — Encounter: Payer: Self-pay | Admitting: Internal Medicine

## 2024-03-18 VITALS — BP 122/80 | HR 80 | Ht 67.5 in | Wt 183.0 lb

## 2024-03-18 DIAGNOSIS — Z7984 Long term (current) use of oral hypoglycemic drugs: Secondary | ICD-10-CM

## 2024-03-18 DIAGNOSIS — Z7985 Long-term (current) use of injectable non-insulin antidiabetic drugs: Secondary | ICD-10-CM

## 2024-03-18 DIAGNOSIS — E1142 Type 2 diabetes mellitus with diabetic polyneuropathy: Secondary | ICD-10-CM | POA: Diagnosis not present

## 2024-03-18 DIAGNOSIS — E1159 Type 2 diabetes mellitus with other circulatory complications: Secondary | ICD-10-CM | POA: Diagnosis not present

## 2024-03-18 DIAGNOSIS — E118 Type 2 diabetes mellitus with unspecified complications: Secondary | ICD-10-CM

## 2024-03-18 HISTORY — PX: ESOPHAGOGASTRODUODENOSCOPY: SHX1529

## 2024-03-18 HISTORY — PX: COLONOSCOPY: SHX174

## 2024-03-18 LAB — POCT GLYCOSYLATED HEMOGLOBIN (HGB A1C): Hemoglobin A1C: 6.3 % — AB (ref 4.0–5.6)

## 2024-03-18 MED ORDER — SEMAGLUTIDE (1 MG/DOSE) 4 MG/3ML ~~LOC~~ SOPN: 1.0000 mg | PEN_INJECTOR | SUBCUTANEOUS | 3 refills | Status: DC

## 2024-03-18 NOTE — Progress Notes (Signed)
 Name: Jerome Vaughn  Age/ Sex: 73 y.o., male   MRN/ DOB: [id number], 1950/11/25     PCP: Donley Furth, MD   Reason for Endocrinology Evaluation: Type 2 Diabetes Mellitus  Initial Endocrine Consultative Visit: 10/04/2013    PATIENT IDENTIFIER: [name] is a 73 y.o. male with a past medical history of DM, HTN, CAD, dyslipidemia the patient has followed with Endocrinology clinic since 10/04/2013 for consultative assistance with management of his diabetes.  DIABETIC HISTORY:  Jerome Vaughn was diagnosed with DM 2002.  Pioglitazone -edema. His hemoglobin A1c has ranged from 6.5% in 2021, peaking at 9.2% in 2023.   He was followed by Dr. Washington Hacker from 2015 until 12/2021  Patient self discontinued bromocriptine  06/2022 Started Ozempic  01/2023  He stopped Jardiance  due to cost issues   SUBJECTIVE:   During the last visit (08/07/2023): A1c 6.4%  Today (03/18/2024): Jerome Vaughn is here for follow-up on diabetes management.  He checks his blood sugars 2-3 times daily. The patient has not  had hypoglycemic episodes since the last clinic visit.   Patient had a follow-up with podiatry on 01/22/2024 Patient follows with cardiology for CAD Pt has been noted with weight loss   Denies nausea, vomiting  Denies constipation or diarrhea     HOME DIABETES REGIMEN:  Metformin  500 mg XR, 2 tabs BID  Prandin  0.5 mg twice daily Ozempic  0.5 mg weekly      Statin: Yes ACE-I/ARB: Yes   METER DOWNLOAD SUMMARY:  79 - 165 mg/dL     DIABETIC COMPLICATIONS: Microvascular complications:  Peripheral neuropathy Denies:  Last Eye Exam: Completed 05/2023  Macrovascular complications:  CAD Denies:  CVA, PVD   HISTORY:  Past Medical History:  Past Medical History:  Diagnosis Date   Carpal tunnel syndrome    both hands   Colon polyps    Coronary artery disease    sees Dr. Alvis Ba   Diabetes mellitus    sees Dr. Augustina Lecher   GERD (gastroesophageal reflux disease)    History of  echocardiogram    a. Echo 4/17: Moderate LVH, EF 50-55%, mild LAE, no pericardial effusion   Hyperlipidemia    Hypertension    MGUS (monoclonal gammopathy of unknown significance)    sees Dr. Amparo Balk   Neuropathy    gets foot exams with Dr. Wilnette Haste (podiatry)   Pneumonia    Rotator cuff injury    Past Surgical History:  Past Surgical History:  Procedure Laterality Date   CARDIAC CATHETERIZATION N/A 11/21/2015   Procedure: Left Heart Cath and Coronary Angiography;  Surgeon: Arty Binning, MD;  [facility] INVASIVE CV LAB;  Service: Cardiovascular;  Laterality: N/A;   COLONOSCOPY  02/23/2020   per Dr. Savannah Curlin, tubular adenomas, repeat in 3 yrs    colonsocopy  12/07/2009   per Dr. Dellis Fermo, benign polyps, repeat in 10 yrs    CORONARY ARTERY BYPASS GRAFT N/A 11/22/2015   Procedure: CORONARY ARTERY BYPASS GRAFTING (CABG) times five using the left internal mammary, right greater saphenous vein EVH, and left thigh greater saphenous vein EVH;  Surgeon: Heriberto London, MD;  Location: [facility] OR;  Service: Open Heart Surgery;  Laterality: N/A;   CORONARY PRESSURE/FFR STUDY N/A 12/14/2018   Procedure: INTRAVASCULAR PRESSURE WIRE/FFR STUDY;  Surgeon: Lucendia Rusk, MD;  Location: [facility] INVASIVE CV LAB;  Service: Cardiovascular;  Laterality: N/A;   frozen shoulder release Right    LEFT HEART CATH AND CORS/GRAFTS ANGIOGRAPHY N/A 10/27/2017   Procedure: LEFT  HEART CATH AND CORS/GRAFTS ANGIOGRAPHY;  Surgeon: Arty Binning, MD;  Location: [facility] INVASIVE CV LAB;  Service: Cardiovascular;  Laterality: N/A;   LEFT HEART CATH AND CORS/GRAFTS ANGIOGRAPHY N/A 12/14/2018   Procedure: LEFT HEART CATH AND CORS/GRAFTS ANGIOGRAPHY;  Surgeon: Lucendia Rusk, MD;  Location: [facility] INVASIVE CV LAB;  Service: Cardiovascular;  Laterality: N/A;   TEE WITHOUT CARDIOVERSION N/A 11/22/2015   Procedure: TRANSESOPHAGEAL ECHOCARDIOGRAM (TEE);  Surgeon: Heriberto London, MD;  Location: [facility] OR;  Service: Open Heart Surgery;   Laterality: N/A;   WRIST GANGLION EXCISION Right    Social History:  reports that he quit smoking about 10 years ago. His smoking use included cigarettes. He started smoking about 30 years ago. He has a 5 pack-year smoking history. He has never used smokeless tobacco. He reports current alcohol use of about 3.0 - 4.0 standard drinks of alcohol per week. He reports that he does not use drugs. Family History:  Family History  Problem Relation Age of Onset   Heart failure Father    Hyperlipidemia Sister    Diabetes Sister    Hypertension Sister    Kidney failure Sister    Hyperlipidemia Sister    Diabetes Sister    Colon polyps Sister    Kidney disease Sister    Anuerysm Brother        AAA   Other Brother        COVID 19   Diabetes Maternal Grandmother    Diabetes Son    Heart disease Son    Heart attack Neg Hx    Colon cancer Neg Hx    Stomach cancer Neg Hx    Esophageal cancer Neg Hx    Pancreatic cancer Neg Hx      HOME MEDICATIONS: Allergies as of 03/18/2024   No Known Allergies      Medication List        Accurate as of March 18, 2024  3:24 PM. If you have any questions, ask your nurse or doctor.          STOP taking these medications    Ozempic  (0.25 or 0.5 MG/DOSE) 2 MG/3ML Sopn Generic drug: Semaglutide (0.25 or 0.5MG /DOS) Replaced by: Semaglutide  (1 MG/DOSE) 4 MG/3ML Sopn Stopped by: Naylin Burkle J Laqueta Bonaventura       TAKE these medications    Accu-Chek Guide test strip Generic drug: glucose blood 1 each by Other route in the morning and at bedtime. Use as instructed   BLOOD GLUCOSE TEST STRIPS Strp 1 each by In Vitro route in the morning, at noon, and at bedtime. May substitute to any manufacturer covered by patient's insurance.   Accu-Chek Guide Test test strip Generic drug: glucose blood TEST BLOOD SUGAR IN THE MORNING, AT NOON AND AT BEDTIME   Accu-Chek Guide w/Device Kit USE AS DIRECTED   Blood Glucose Monitoring Suppl Devi 1 each by Does not  apply route in the morning, at noon, and at bedtime. May substitute to any manufacturer covered by patient's insurance. ACCU CHECK GUIDE   Accu-Chek Softclix Lancets lancets USE IN THE MORNING, AT NOON, AND AT BEDTIME.   acetaminophen  500 MG tablet Commonly known as: TYLENOL  Take 500 mg by mouth as needed.   APPLE CIDER VINEGAR PO Take by mouth daily.   aspirin  EC 81 MG tablet Take 1 tablet (81 mg total) by mouth daily.   benazepril  20 MG tablet Commonly known as: LOTENSIN  TAKE 1 TABLET EVERY F[place]ara   carvedilol  3.125 MG tablet Commonly known  as: COREG  Take 1 tablet (3.125 mg total) by mouth 2 (two) times daily with a meal.   Cinnamon 500 MG capsule Take 1 capsule by mouth daily.   CoQ-10 100 MG Caps Take 1 tablet by mouth daily.   ezetimibe  10 MG tablet Commonly known as: ZETIA  Take 1 tablet (10 mg total) by mouth daily.   ferrous sulfate 325 (65 FE) MG tablet Take 325 mg by mouth daily with breakfast.   FISH OIL PO Take 1 capsule by mouth daily.   Garlic 1000 MG Caps Take 1 tablet by mouth daily.   meloxicam 15 MG tablet Commonly known as: MOBIC Take 1 tablet by mouth daily as needed.   metFORMIN  500 MG 24 hr tablet Commonly known as: GLUCOPHAGE -XR Take 4 tablets (2,000 mg total) by mouth daily.   multivitamin tablet Take 1 tablet by mouth daily.   nitroGLYCERIN  0.4 MG SL tablet Commonly known as: NITROSTAT  Place 1 tablet (0.4 mg total) under the tongue every 5 (five) minutes as needed for chest pain.   pantoprazole  40 MG tablet Commonly known as: PROTONIX  Take 1 tablet (40 mg total) by mouth 2 (two) times daily. Please schedule an office visit for further refills. Thank you   ranolazine  500 MG 12 hr tablet Commonly known as: RANEXA  TAKE 1 TABLET BY MOUTH 2 TIMES A Streng   repaglinide  0.5 MG tablet Commonly known as: PRANDIN  Take 1 tablet (0.5 mg total) by mouth 2 (two) times daily before a meal.   rosuvastatin  40 MG tablet Commonly known as:  CRESTOR  TAKE 1 TABLET EVERY Bealer   Semaglutide  (1 MG/DOSE) 4 MG/3ML Sopn Inject 1 mg as directed once a week. Replaces: Ozempic  (0.25 or 0.5 MG/DOSE) 2 MG/3ML Sopn Started by: Mulki Roesler J Kesia Dalto   sildenafil  100 MG tablet Commonly known as: VIAGRA  TAKE 1 TABLET BY MOUTH AS NEEDED FOR ERECTILE DYSFUNCTION   tadalafil  20 MG tablet Commonly known as: CIALIS  TAKE 1 TABLET BY MOUTH AS NEEDED FOR ERECTILE DYSFUNCTION   VITAMIN B-12 PO Take 1 tablet by mouth daily.   VITAMIN C PO Take 1 tablet by mouth daily.   VITAMIN E PO Take 1 capsule by mouth daily.         OBJECTIVE:   Vital Signs: BP 122/80 (BP Location: Left Arm, Patient Position: Sitting, Cuff Size: Normal)   Pulse 80   Ht 5' 7.5 (1.715 m)   Wt 183 lb (83 kg)   SpO2 99%   BMI 28.24 kg/m   Wt Readings from Last 3 Encounters:  03/18/24 183 lb (83 kg)  12/18/23 183 lb 6.4 oz (83.2 kg)  08/07/23 190 lb (86.2 kg)     Exam: General: Pt appears well and is in NAD  Lungs: Clear with good BS bilat   Heart: RRR   Abdomen:  soft, nontender  Extremities: No pretibial edema.   Neuro: MS is good with appropriate affect, pt is alert and Ox3    DM foot exam: Per podiatry 01/22/2024       DATA REVIEWED:  Lab Results  Component Value Date   HGBA1C 6.3 (A) 03/18/2024   HGBA1C 6.4 (A) 08/07/2023   HGBA1C 6.6 (A) 02/02/2023    Latest Reference Range & Units 04/10/23 09:52  Sodium 135 - 145 mmol/L 135  Potassium 3.5 - 5.1 mmol/L 4.5  Chloride 98 - 111 mmol/L 106  CO2 22 - 32 mmol/L 21 (L)  Glucose 70 - 99 mg/dL 098 (H)  BUN 8 - 23 mg/dL 21  Creatinine 0.61 - 1.24 mg/dL 3.24  Calcium  8.9 - 10.3 mg/dL 9.1  Anion gap 5 - 15  8  Alkaline Phosphatase 38 - 126 U/L 23 (L)  Albumin  3.5 - 5.0 g/dL 4.2  AST 15 - 41 U/L 20  ALT 0 - 44 U/L 23  Total Protein 6.5 - 8.1 g/dL 7.7  Total Bilirubin 0.3 - 1.2 mg/dL 0.4  GFR, Est Non African American >60 mL/min >60   Old records , labs and images have been reviewed.    ASSESSMENT / PLAN / RECOMMENDATIONS:   1) Type 2 Diabetes Mellitus, Optimally controlled, With neuropathic and macrovascular complications - Most recent A1c of 6.3 %. Goal A1c < 7.0 %.     -A1c at goal -He was on Jardiance  through cardiology, but once he started Ozempic  he could not afford Jardiance , we discussed the cardiovascular and renal benefit of SGLT2 inhibitors, he was provided with patient assistance program for Jardiance  -I have recommended increasing Ozempic  and discontinuing Prandin    MEDICATIONS: Increase Ozempic   1 mg weekly Continue metformin  500 mg XR, 2 tabs twice daily Stop repaglinide  0.5 mg twice daily  EDUCATION / INSTRUCTIONS: BG monitoring instructions: Patient is instructed to check his blood sugars 1 times daily. Call [facility] if: BG persistently < 70  I reviewed the Rule of 15 for the treatment of hypoglycemia in detail with the patient. Literature supplied.    2) Diabetic complications:  Eye: Does not have known diabetic retinopathy.  Neuro/ Feet: Does  have known diabetic peripheral neuropathy .  Renal: Patient does not have known baseline CKD. He   is on an ACEI/ARB at present.     F/U in 6 months   Signed electronically by: Natale Bail, MD  [facility]  [facility] [address] [place], [place] [address] Phone: [phone] FAX: [phone]   CC: Donley Furth, MD [address] [place] [address] Phone: [phone]  Fax: [phone]  Return to Endocrinology clinic as below: Future Appointments  Date Time Provider Department Center  03/25/2024  9:00 AM Donley Furth, MD [facility]  04/22/2024  9:15 AM Velma Ghazi, DPM [facility]  04/29/2024 11:30 AM [facility] None  05/06/2024 11:20 AM Ander Bame, MD [facility] None  07/22/2024  2:00 PM Kamira Mellette, Julian Obey, MD [facility] None

## 2024-03-18 NOTE — Patient Instructions (Signed)
 Continue Metformin  500 mg XR, 2 tablets twice daily  Increase  Ozempic  1 mg once weekly  STOP repaglinide     HOW TO TREAT LOW BLOOD SUGARS (Blood sugar LESS THAN 70 MG/DL) Please follow the RULE OF 15 for the treatment of hypoglycemia treatment (when your (blood sugars are less than 70 mg/dL)   STEP 1: Take 15 grams of carbohydrates when your blood sugar is low, which includes:  3-4 GLUCOSE TABS  OR 3-4 OZ OF JUICE OR REGULAR SODA OR ONE TUBE OF GLUCOSE GEL    STEP 2: RECHECK blood sugar in 15 MINUTES STEP 3: If your blood sugar is still low at the 15 minute recheck --> then, go back to STEP 1 and treat AGAIN with another 15 grams of carbohydrates.

## 2024-03-25 ENCOUNTER — Ambulatory Visit: Payer: Self-pay | Admitting: Family Medicine

## 2024-03-25 ENCOUNTER — Ambulatory Visit (INDEPENDENT_AMBULATORY_CARE_PROVIDER_SITE_OTHER): Admitting: Family Medicine

## 2024-03-25 ENCOUNTER — Encounter: Payer: Self-pay | Admitting: Family Medicine

## 2024-03-25 VITALS — BP 104/60 | HR 74 | Temp 97.8°F | Ht 66.0 in | Wt 182.0 lb

## 2024-03-25 DIAGNOSIS — I5042 Chronic combined systolic (congestive) and diastolic (congestive) heart failure: Secondary | ICD-10-CM

## 2024-03-25 DIAGNOSIS — N138 Other obstructive and reflux uropathy: Secondary | ICD-10-CM | POA: Diagnosis not present

## 2024-03-25 DIAGNOSIS — I25708 Atherosclerosis of coronary artery bypass graft(s), unspecified, with other forms of angina pectoris: Secondary | ICD-10-CM | POA: Diagnosis not present

## 2024-03-25 DIAGNOSIS — E1142 Type 2 diabetes mellitus with diabetic polyneuropathy: Secondary | ICD-10-CM | POA: Diagnosis not present

## 2024-03-25 DIAGNOSIS — D472 Monoclonal gammopathy: Secondary | ICD-10-CM | POA: Diagnosis not present

## 2024-03-25 DIAGNOSIS — I10 Essential (primary) hypertension: Secondary | ICD-10-CM | POA: Diagnosis not present

## 2024-03-25 DIAGNOSIS — K219 Gastro-esophageal reflux disease without esophagitis: Secondary | ICD-10-CM

## 2024-03-25 DIAGNOSIS — D508 Other iron deficiency anemias: Secondary | ICD-10-CM | POA: Diagnosis not present

## 2024-03-25 DIAGNOSIS — I83813 Varicose veins of bilateral lower extremities with pain: Secondary | ICD-10-CM

## 2024-03-25 DIAGNOSIS — Z951 Presence of aortocoronary bypass graft: Secondary | ICD-10-CM

## 2024-03-25 DIAGNOSIS — D61818 Other pancytopenia: Secondary | ICD-10-CM | POA: Diagnosis not present

## 2024-03-25 DIAGNOSIS — N521 Erectile dysfunction due to diseases classified elsewhere: Secondary | ICD-10-CM

## 2024-03-25 DIAGNOSIS — N401 Enlarged prostate with lower urinary tract symptoms: Secondary | ICD-10-CM

## 2024-03-25 DIAGNOSIS — E7800 Pure hypercholesterolemia, unspecified: Secondary | ICD-10-CM

## 2024-03-25 LAB — HEPATIC FUNCTION PANEL
ALT: 32 U/L (ref 0–53)
AST: 28 U/L (ref 0–37)
Albumin: 4.4 g/dL (ref 3.5–5.2)
Alkaline Phosphatase: 28 U/L — ABNORMAL LOW (ref 39–117)
Bilirubin, Direct: 0.1 mg/dL (ref 0.0–0.3)
Total Bilirubin: 0.5 mg/dL (ref 0.2–1.2)
Total Protein: 7.8 g/dL (ref 6.0–8.3)

## 2024-03-25 LAB — TSH: TSH: 0.72 u[IU]/mL (ref 0.35–5.50)

## 2024-03-25 LAB — BASIC METABOLIC PANEL WITH GFR
BUN: 16 mg/dL (ref 6–23)
CO2: 26 meq/L (ref 19–32)
Calcium: 9.4 mg/dL (ref 8.4–10.5)
Chloride: 103 meq/L (ref 96–112)
Creatinine, Ser: 1.16 mg/dL (ref 0.40–1.50)
GFR: 63.75 mL/min (ref >60.00)
Glucose, Bld: 101 mg/dL — ABNORMAL HIGH (ref 70–99)
Potassium: 4.2 meq/L (ref 3.5–5.1)
Sodium: 135 meq/L (ref 135–145)

## 2024-03-25 LAB — LIPID PANEL
Cholesterol: 141 mg/dL (ref 0–200)
HDL: 64.2 mg/dL (ref >39.00)
LDL Cholesterol: 63 mg/dL (ref 0–99)
NonHDL: 76.38
Total CHOL/HDL Ratio: 2
Triglycerides: 68 mg/dL (ref 0.0–149.0)
VLDL: 13.6 mg/dL (ref 0.0–40.0)

## 2024-03-25 LAB — PSA: PSA: 0.33 ng/mL (ref 0.10–4.00)

## 2024-03-25 MED ORDER — PANTOPRAZOLE SODIUM 40 MG PO TBEC: 40.0000 mg | DELAYED_RELEASE_TABLET | Freq: Two times a day (BID) | ORAL | 3 refills | Status: DC

## 2024-03-25 MED ORDER — MELOXICAM 15 MG PO TABS: 15.0000 mg | ORAL_TABLET | Freq: Every day | ORAL | 3 refills | Status: DC

## 2024-03-25 NOTE — Progress Notes (Signed)
 Subjective:    Patient ID: Jerome Vaughn, male    DOB: 08-Jul-1951, 73 y.o.   MRN: [id number]  HPI Here to follow up on issues. He feels well in general. He sees Dr. Sam for his diabetes, and his last A1c on 03-18-24 was 6.3%. He sees Dr. [name] once a year for CAD and CHF, and he has been doing well. His last CBC on 01-22-24 showed WBC 4.1, platelets 156, and Hgb 12.6. His iron  was 67 and ferritin was 36, and these were stable. His BP is stable. His OA is stable. His BPH is stable.    Review of Systems  Constitutional: Negative.   HENT: Negative.    Eyes: Negative.   Respiratory: Negative.    Cardiovascular: Negative.   Gastrointestinal: Negative.   Genitourinary: Negative.   Musculoskeletal: Negative.   Skin: Negative.   Neurological: Negative.   Psychiatric/Behavioral: Negative.         Objective:   Physical Exam Constitutional:      General: He is not in acute distress.    Appearance: Normal appearance. He is well-developed. He is not diaphoretic.  HENT:     Head: Normocephalic and atraumatic.     Right Ear: External ear normal.     Left Ear: External ear normal.     Nose: Nose normal.     Mouth/Throat:     Pharynx: No oropharyngeal exudate.   Eyes:     General: No scleral icterus.       Right eye: No discharge.        Left eye: No discharge.     Conjunctiva/sclera: Conjunctivae normal.     Pupils: Pupils are equal, round, and reactive to light.   Neck:     Thyroid : No thyromegaly.     Vascular: No JVD.     Trachea: No tracheal deviation.   Cardiovascular:     Rate and Rhythm: Normal rate and regular rhythm.     Pulses: Normal pulses.     Heart sounds: Normal heart sounds. No murmur heard.    No friction rub. No gallop.  Pulmonary:     Effort: Pulmonary effort is normal. No respiratory distress.     Breath sounds: Normal breath sounds. No wheezing or rales.  Chest:     Chest wall: No tenderness.  Abdominal:     General: Bowel sounds are normal.  There is no distension.     Palpations: Abdomen is soft. There is no mass.     Tenderness: There is no abdominal tenderness. There is no guarding or rebound.  Genitourinary:    Penis: Normal. No tenderness.      Testes: Normal.     Prostate: Normal.     Rectum: Normal. Guaiac result negative.   Musculoskeletal:        General: No tenderness. Normal range of motion.     Cervical back: Neck supple.  Lymphadenopathy:     Cervical: No cervical adenopathy.   Skin:    General: Skin is warm and dry.     Coloration: Skin is not pale.     Findings: No erythema or rash.   Neurological:     General: No focal deficit present.     Mental Status: He is alert and oriented to person, place, and time.     Cranial Nerves: No cranial nerve deficit.     Motor: No abnormal muscle tone.     Coordination: Coordination normal.     Deep Tendon Reflexes: Reflexes  are normal and symmetric. Reflexes normal.   Psychiatric:        Mood and Affect: Mood normal.        Behavior: Behavior normal.        Thought Content: Thought content normal.        Judgment: Judgment normal.           Assessment & Plan:  He is doing well overall. His diabetes is well controlled. He has yearly eye and foot exams, and these have been normal. His CAD and CHF are stable. We will get labs to check lipids, renal function, etc. We spent a total of ( 35  ) minutes reviewing records and discussing these issues.  Garnette Olmsted, MD

## 2024-04-15 ENCOUNTER — Emergency Department (HOSPITAL_COMMUNITY)

## 2024-04-15 ENCOUNTER — Observation Stay (HOSPITAL_COMMUNITY)

## 2024-04-15 ENCOUNTER — Inpatient Hospital Stay (HOSPITAL_COMMUNITY)
Admission: EM | Admit: 2024-04-15 | Discharge: 2024-04-21 | DRG: 472 | Disposition: A | Discharge status: Still In Hospital | Attending: General Surgery | Admitting: General Surgery

## 2024-04-15 ENCOUNTER — Encounter (HOSPITAL_COMMUNITY): Payer: Self-pay

## 2024-04-15 ENCOUNTER — Other Ambulatory Visit: Payer: Self-pay

## 2024-04-15 DIAGNOSIS — R579 Shock, unspecified: Principal | ICD-10-CM | POA: Diagnosis present

## 2024-04-15 DIAGNOSIS — T1490XA Injury, unspecified, initial encounter: Secondary | ICD-10-CM

## 2024-04-15 DIAGNOSIS — Z79899 Other long term (current) drug therapy: Secondary | ICD-10-CM | POA: Diagnosis not present

## 2024-04-15 DIAGNOSIS — M19012 Primary osteoarthritis, left shoulder: Secondary | ICD-10-CM | POA: Diagnosis not present

## 2024-04-15 DIAGNOSIS — S14129A Central cord syndrome at unspecified level of cervical spinal cord, initial encounter: Secondary | ICD-10-CM | POA: Diagnosis present

## 2024-04-15 DIAGNOSIS — S14123D Central cord syndrome at C3 level of cervical spinal cord, subsequent encounter: Secondary | ICD-10-CM | POA: Diagnosis not present

## 2024-04-15 DIAGNOSIS — D649 Anemia, unspecified: Secondary | ICD-10-CM | POA: Diagnosis present

## 2024-04-15 DIAGNOSIS — W19XXXA Unspecified fall, initial encounter: Secondary | ICD-10-CM | POA: Diagnosis not present

## 2024-04-15 DIAGNOSIS — Z794 Long term (current) use of insulin: Secondary | ICD-10-CM | POA: Diagnosis not present

## 2024-04-15 DIAGNOSIS — Z981 Arthrodesis status: Secondary | ICD-10-CM | POA: Diagnosis not present

## 2024-04-15 DIAGNOSIS — M4313 Spondylolisthesis, cervicothoracic region: Secondary | ICD-10-CM | POA: Diagnosis not present

## 2024-04-15 DIAGNOSIS — R059 Cough, unspecified: Secondary | ICD-10-CM | POA: Diagnosis not present

## 2024-04-15 DIAGNOSIS — Z7984 Long term (current) use of oral hypoglycemic drugs: Secondary | ICD-10-CM

## 2024-04-15 DIAGNOSIS — I951 Orthostatic hypotension: Secondary | ICD-10-CM | POA: Diagnosis not present

## 2024-04-15 DIAGNOSIS — M160 Bilateral primary osteoarthritis of hip: Secondary | ICD-10-CM | POA: Diagnosis present

## 2024-04-15 DIAGNOSIS — I10 Essential (primary) hypertension: Secondary | ICD-10-CM | POA: Diagnosis present

## 2024-04-15 DIAGNOSIS — W208XXA Other cause of strike by thrown, projected or falling object, initial encounter: Secondary | ICD-10-CM | POA: Diagnosis present

## 2024-04-15 DIAGNOSIS — M19011 Primary osteoarthritis, right shoulder: Secondary | ICD-10-CM | POA: Diagnosis not present

## 2024-04-15 DIAGNOSIS — K5901 Slow transit constipation: Secondary | ICD-10-CM | POA: Diagnosis not present

## 2024-04-15 DIAGNOSIS — K5900 Constipation, unspecified: Secondary | ICD-10-CM | POA: Diagnosis not present

## 2024-04-15 DIAGNOSIS — S3991XA Unspecified injury of abdomen, initial encounter: Secondary | ICD-10-CM | POA: Diagnosis not present

## 2024-04-15 DIAGNOSIS — M4804 Spinal stenosis, thoracic region: Principal | ICD-10-CM | POA: Diagnosis present

## 2024-04-15 DIAGNOSIS — K219 Gastro-esophageal reflux disease without esophagitis: Secondary | ICD-10-CM | POA: Diagnosis present

## 2024-04-15 DIAGNOSIS — G8250 Quadriplegia, unspecified: Secondary | ICD-10-CM | POA: Diagnosis not present

## 2024-04-15 DIAGNOSIS — R7989 Other specified abnormal findings of blood chemistry: Secondary | ICD-10-CM | POA: Diagnosis not present

## 2024-04-15 DIAGNOSIS — M25512 Pain in left shoulder: Secondary | ICD-10-CM | POA: Diagnosis not present

## 2024-04-15 DIAGNOSIS — E119 Type 2 diabetes mellitus without complications: Secondary | ICD-10-CM | POA: Diagnosis present

## 2024-04-15 DIAGNOSIS — S0993XA Unspecified injury of face, initial encounter: Secondary | ICD-10-CM | POA: Diagnosis not present

## 2024-04-15 DIAGNOSIS — S3993XA Unspecified injury of pelvis, initial encounter: Secondary | ICD-10-CM | POA: Diagnosis not present

## 2024-04-15 DIAGNOSIS — Z7985 Long-term (current) use of injectable non-insulin antidiabetic drugs: Secondary | ICD-10-CM | POA: Diagnosis not present

## 2024-04-15 DIAGNOSIS — R220 Localized swelling, mass and lump, head: Secondary | ICD-10-CM | POA: Diagnosis not present

## 2024-04-15 DIAGNOSIS — W132XXA Fall from, out of or through roof, initial encounter: Secondary | ICD-10-CM | POA: Diagnosis present

## 2024-04-15 DIAGNOSIS — G904 Autonomic dysreflexia: Secondary | ICD-10-CM | POA: Diagnosis not present

## 2024-04-15 DIAGNOSIS — M4802 Spinal stenosis, cervical region: Secondary | ICD-10-CM | POA: Diagnosis present

## 2024-04-15 DIAGNOSIS — Z951 Presence of aortocoronary bypass graft: Secondary | ICD-10-CM

## 2024-04-15 DIAGNOSIS — R339 Retention of urine, unspecified: Secondary | ICD-10-CM | POA: Diagnosis present

## 2024-04-15 DIAGNOSIS — S299XXA Unspecified injury of thorax, initial encounter: Secondary | ICD-10-CM | POA: Diagnosis not present

## 2024-04-15 DIAGNOSIS — Z4789 Encounter for other orthopedic aftercare: Secondary | ICD-10-CM | POA: Diagnosis not present

## 2024-04-15 DIAGNOSIS — E785 Hyperlipidemia, unspecified: Secondary | ICD-10-CM | POA: Diagnosis present

## 2024-04-15 DIAGNOSIS — R0789 Other chest pain: Secondary | ICD-10-CM | POA: Diagnosis not present

## 2024-04-15 DIAGNOSIS — S199XXA Unspecified injury of neck, initial encounter: Secondary | ICD-10-CM | POA: Diagnosis not present

## 2024-04-15 DIAGNOSIS — R578 Other shock: Secondary | ICD-10-CM | POA: Diagnosis present

## 2024-04-15 DIAGNOSIS — N319 Neuromuscular dysfunction of bladder, unspecified: Secondary | ICD-10-CM | POA: Diagnosis present

## 2024-04-15 DIAGNOSIS — D696 Thrombocytopenia, unspecified: Secondary | ICD-10-CM | POA: Diagnosis present

## 2024-04-15 DIAGNOSIS — S13140A Subluxation of C3/C4 cervical vertebrae, initial encounter: Secondary | ICD-10-CM | POA: Diagnosis not present

## 2024-04-15 DIAGNOSIS — G8254 Quadriplegia, C5-C7 incomplete: Secondary | ICD-10-CM | POA: Diagnosis not present

## 2024-04-15 DIAGNOSIS — Z4682 Encounter for fitting and adjustment of non-vascular catheter: Secondary | ICD-10-CM | POA: Diagnosis not present

## 2024-04-15 DIAGNOSIS — K592 Neurogenic bowel, not elsewhere classified: Secondary | ICD-10-CM | POA: Diagnosis present

## 2024-04-15 DIAGNOSIS — I2510 Atherosclerotic heart disease of native coronary artery without angina pectoris: Secondary | ICD-10-CM | POA: Diagnosis present

## 2024-04-15 DIAGNOSIS — S14124A Central cord syndrome at C4 level of cervical spinal cord, initial encounter: Secondary | ICD-10-CM | POA: Diagnosis not present

## 2024-04-15 DIAGNOSIS — F54 Psychological and behavioral factors associated with disorders or diseases classified elsewhere: Secondary | ICD-10-CM | POA: Diagnosis not present

## 2024-04-15 DIAGNOSIS — R739 Hyperglycemia, unspecified: Secondary | ICD-10-CM | POA: Diagnosis not present

## 2024-04-15 DIAGNOSIS — M5030 Other cervical disc degeneration, unspecified cervical region: Secondary | ICD-10-CM | POA: Diagnosis not present

## 2024-04-15 DIAGNOSIS — I9589 Other hypotension: Secondary | ICD-10-CM | POA: Diagnosis not present

## 2024-04-15 DIAGNOSIS — M25511 Pain in right shoulder: Secondary | ICD-10-CM | POA: Diagnosis not present

## 2024-04-15 DIAGNOSIS — N390 Urinary tract infection, site not specified: Secondary | ICD-10-CM | POA: Diagnosis not present

## 2024-04-15 DIAGNOSIS — R338 Other retention of urine: Secondary | ICD-10-CM | POA: Diagnosis not present

## 2024-04-15 DIAGNOSIS — S14157S Other incomplete lesion at C7 level of cervical spinal cord, sequela: Secondary | ICD-10-CM | POA: Diagnosis not present

## 2024-04-15 DIAGNOSIS — M50222 Other cervical disc displacement at C5-C6 level: Secondary | ICD-10-CM | POA: Diagnosis not present

## 2024-04-15 DIAGNOSIS — I959 Hypotension, unspecified: Secondary | ICD-10-CM | POA: Diagnosis not present

## 2024-04-15 DIAGNOSIS — S14123A Central cord syndrome at C3 level of cervical spinal cord, initial encounter: Secondary | ICD-10-CM | POA: Diagnosis not present

## 2024-04-15 DIAGNOSIS — M5021 Other cervical disc displacement,  high cervical region: Secondary | ICD-10-CM | POA: Diagnosis not present

## 2024-04-15 DIAGNOSIS — S0990XA Unspecified injury of head, initial encounter: Secondary | ICD-10-CM | POA: Diagnosis not present

## 2024-04-15 DIAGNOSIS — I700 Atherosclerosis of aorta: Secondary | ICD-10-CM | POA: Diagnosis not present

## 2024-04-15 DIAGNOSIS — D6959 Other secondary thrombocytopenia: Secondary | ICD-10-CM | POA: Diagnosis not present

## 2024-04-15 HISTORY — DX: Essential (primary) hypertension: I10

## 2024-04-15 LAB — CBC
HCT: 35.4 % — ABNORMAL LOW (ref 39.0–52.0)
HCT: 35.7 % — ABNORMAL LOW (ref 39.0–52.0)
Hemoglobin: 11.6 g/dL — ABNORMAL LOW (ref 13.0–17.0)
Hemoglobin: 11.7 g/dL — ABNORMAL LOW (ref 13.0–17.0)
MCH: 29.4 pg (ref 26.0–34.0)
MCH: 29.1 pg (ref 26.0–34.0)
MCHC: 32.8 g/dL (ref 30.0–36.0)
MCHC: 32.8 g/dL (ref 30.0–36.0)
MCV: 89.8 fL (ref 80.0–100.0)
MCV: 88.8 fL (ref 80.0–100.0)
Platelets: 100 K/uL — ABNORMAL LOW (ref 150–400)
Platelets: 105 K/uL — ABNORMAL LOW (ref 150–400)
RBC: 3.94 MIL/uL — ABNORMAL LOW (ref 4.22–5.81)
RBC: 4.02 MIL/uL — ABNORMAL LOW (ref 4.22–5.81)
RDW: 13.8 % (ref 11.5–15.5)
RDW: 13.9 % (ref 11.5–15.5)
WBC: 2.6 K/uL — ABNORMAL LOW (ref 4.0–10.5)
WBC: 6.3 K/uL (ref 4.0–10.5)
nRBC: 0 % (ref 0.0–0.2)
nRBC: 0 % (ref 0.0–0.2)

## 2024-04-15 LAB — COMPREHENSIVE METABOLIC PANEL WITH GFR
ALT: 27 U/L (ref 0–44)
AST: 28 U/L (ref 15–41)
Albumin: 3 g/dL — ABNORMAL LOW (ref 3.5–5.0)
Alkaline Phosphatase: 24 U/L — ABNORMAL LOW (ref 38–126)
Anion gap: 10 (ref 5–15)
BUN: 19 mg/dL (ref 8–23)
CO2: 20 mmol/L — ABNORMAL LOW (ref 22–32)
Calcium: 8.4 mg/dL — ABNORMAL LOW (ref 8.9–10.3)
Chloride: 107 mmol/L (ref 98–111)
Creatinine, Ser: 1.43 mg/dL — ABNORMAL HIGH (ref 0.61–1.24)
GFR, Estimated: 53 mL/min — ABNORMAL LOW (ref >60)
Glucose, Bld: 177 mg/dL — ABNORMAL HIGH (ref 70–99)
Potassium: 4.2 mmol/L (ref 3.5–5.1)
Sodium: 137 mmol/L (ref 135–145)
Total Bilirubin: 0.7 mg/dL (ref 0.0–1.2)
Total Protein: 5.8 g/dL — ABNORMAL LOW (ref 6.5–8.1)

## 2024-04-15 LAB — CREATININE, SERUM
Creatinine, Ser: 1.27 mg/dL — ABNORMAL HIGH (ref 0.61–1.24)
GFR, Estimated: >60 mL/min (ref >60)

## 2024-04-15 LAB — I-STAT CHEM 8, ED
BUN: 19 mg/dL (ref 8–23)
Calcium, Ion: 0.95 mmol/L — ABNORMAL LOW (ref 1.15–1.40)
Chloride: 106 mmol/L (ref 98–111)
Creatinine, Ser: 1.4 mg/dL — ABNORMAL HIGH (ref 0.61–1.24)
Glucose, Bld: 174 mg/dL — ABNORMAL HIGH (ref 70–99)
HCT: 34 % — ABNORMAL LOW (ref 39.0–52.0)
Hemoglobin: 11.6 g/dL — ABNORMAL LOW (ref 13.0–17.0)
Potassium: 4.3 mmol/L (ref 3.5–5.1)
Sodium: 138 mmol/L (ref 135–145)
TCO2: 20 mmol/L — ABNORMAL LOW (ref 22–32)

## 2024-04-15 LAB — HIV ANTIBODY (ROUTINE TESTING W REFLEX): HIV Screen 4th Generation wRfx: NONREACTIVE

## 2024-04-15 LAB — I-STAT CG4 LACTIC ACID, ED: Lactic Acid, Venous: 1.9 mmol/L (ref 0.5–1.9)

## 2024-04-15 LAB — PROTIME-INR
INR: 1.1 (ref 0.8–1.2)
Prothrombin Time: 15.2 s (ref 11.4–15.2)

## 2024-04-15 LAB — CBG MONITORING, ED: Glucose-Capillary: 167 mg/dL — ABNORMAL HIGH (ref 70–99)

## 2024-04-15 LAB — CARBAMAZEPINE LEVEL, TOTAL: Carbamazepine Lvl: <2 ug/mL — ABNORMAL LOW (ref 4.0–12.0)

## 2024-04-15 LAB — PREPARE RBC (CROSSMATCH)

## 2024-04-15 LAB — ETHANOL: Alcohol, Ethyl (B): <15 mg/dL (ref <15)

## 2024-04-15 LAB — ABO/RH: ABO/RH(D): A POS

## 2024-04-15 MED ORDER — NOREPINEPHRINE 4 MG/250ML-% IV SOLN: 0.0000 ug/min | INTRAVENOUS | Status: DC

## 2024-04-15 MED ORDER — HYDRALAZINE HCL 20 MG/ML IJ SOLN: 10.0000 mg | INTRAMUSCULAR | Status: DC | PRN

## 2024-04-15 MED ORDER — ONDANSETRON 4 MG PO TBDP: 4.0000 mg | ORAL_TABLET | Freq: Four times a day (QID) | ORAL | Status: DC | PRN

## 2024-04-15 MED ORDER — DOCUSATE SODIUM 100 MG PO CAPS
100.0000 mg | ORAL_CAPSULE | Freq: Two times a day (BID) | ORAL | Status: DC
  Administered 2024-04-16 – 2024-04-21 (×10): 100 mg via ORAL
  Filled 2024-04-15 (×10): qty 1

## 2024-04-15 MED ORDER — OXYCODONE HCL 5 MG PO TABS
5.0000 mg | ORAL_TABLET | ORAL | Status: DC | PRN
  Administered 2024-04-15 – 2024-04-17 (×2): 10 mg via ORAL
  Filled 2024-04-15 (×3): qty 2

## 2024-04-15 MED ORDER — METHOCARBAMOL 1000 MG/10ML IJ SOLN
500.0000 mg | Freq: Three times a day (TID) | INTRAMUSCULAR | Status: AC
  Filled 2024-04-15: qty 10

## 2024-04-15 MED ORDER — LACTATED RINGERS IV SOLN: INTRAVENOUS | Status: AC

## 2024-04-15 MED ORDER — SODIUM CHLORIDE 0.9% IV SOLUTION: Freq: Once | INTRAVENOUS | Status: DC

## 2024-04-15 MED ORDER — IOHEXOL 350 MG/ML SOLN
75.0000 mL | Freq: Once | INTRAVENOUS | Status: AC | PRN
  Administered 2024-04-15: 75 mL via INTRAVENOUS

## 2024-04-15 MED ORDER — ACETAMINOPHEN 500 MG PO TABS
1000.0000 mg | ORAL_TABLET | Freq: Four times a day (QID) | ORAL | Status: DC
  Administered 2024-04-15 – 2024-04-21 (×22): 1000 mg via ORAL
  Filled 2024-04-15 (×24): qty 2

## 2024-04-15 MED ORDER — METHOCARBAMOL 500 MG PO TABS
500.0000 mg | ORAL_TABLET | Freq: Three times a day (TID) | ORAL | Status: AC
  Administered 2024-04-15 – 2024-04-18 (×9): 500 mg via ORAL
  Filled 2024-04-15 (×9): qty 1

## 2024-04-15 MED ORDER — CALCIUM GLUCONATE-NACL 1-0.675 GM/50ML-% IV SOLN
1.0000 g | Freq: Once | INTRAVENOUS | Status: AC
  Administered 2024-04-15: 1000 mg via INTRAVENOUS

## 2024-04-15 MED ORDER — LACTATED RINGERS IV BOLUS: 1000.0000 mL | Freq: Once | INTRAVENOUS | Status: DC

## 2024-04-15 MED ORDER — METOPROLOL TARTRATE 5 MG/5ML IV SOLN: 5.0000 mg | Freq: Four times a day (QID) | INTRAVENOUS | Status: DC | PRN

## 2024-04-15 MED ORDER — ENOXAPARIN SODIUM 30 MG/0.3ML IJ SOSY
30.0000 mg | PREFILLED_SYRINGE | Freq: Two times a day (BID) | INTRAMUSCULAR | Status: DC
  Administered 2024-04-17 – 2024-04-18 (×4): 30 mg via SUBCUTANEOUS
  Filled 2024-04-15 (×4): qty 0.3

## 2024-04-15 MED ORDER — ONDANSETRON HCL 4 MG/2ML IJ SOLN
4.0000 mg | Freq: Once | INTRAMUSCULAR | Status: AC
  Administered 2024-04-15: 4 mg via INTRAVENOUS

## 2024-04-15 MED ORDER — POLYETHYLENE GLYCOL 3350 17 G PO PACK: 17.0000 g | PACK | Freq: Every day | ORAL | Status: DC | PRN

## 2024-04-15 MED ORDER — ONDANSETRON HCL 4 MG/2ML IJ SOLN: 4.0000 mg | Freq: Four times a day (QID) | INTRAMUSCULAR | Status: DC | PRN

## 2024-04-15 NOTE — Progress Notes (Signed)
 Transition of Care [facility]) - CAGE-AID Screening   Patient Details  Name: Ho Parisi MRN: [id number] Date of Birth: [birth year]  Transition of Care [facility]) CM/SW Contact:    Bernardino Mayotte, RN Phone Number: 04/15/2024, 9:31 PM   Clinical Narrative:  Patient endorses occasional alcohol use, denies illicit drugs. Resources not given at this time.  CAGE-AID Screening:    Have You Ever Felt You Ought to Cut Down on Your Drinking or Drug Use?: No Have People Annoyed You By Critizing Your Drinking Or Drug Use?: No Have You Felt Bad Or Guilty About Your Drinking Or Drug Use?: No Have You Ever Had a Drink or Used Drugs First Thing In The Morning to Steady Your Nerves or to Get Rid of a Hangover?: No CAGE-AID Score: 0  Substance Abuse Education Offered: No

## 2024-04-15 NOTE — ED Notes (Signed)
C-collar removed by Dr. Lovick. °

## 2024-04-15 NOTE — ED Notes (Signed)
 Trauma Response Nurse Documentation   Jerome Vaughn is a 73 y.o. male arriving to [facility] ED via EMS  On No antithrombotic. Trauma was activated as a Level 1 by Charge RN based on the following trauma criteria Anytime Systolic Blood Pressure < 90.  Patient cleared for CT by Dr. Paola. Pt transported to CT with trauma response nurse present to monitor. RN remained with the patient throughout their absence from the department for clinical observation.   GCS 15.  Trauma MD Arrival Time: 1640.  History   CABG --         Initial Focused Assessment (If applicable, or please see trauma documentation): Airway - CLear Breathing - Unlabored Circulation - Peripheral pulses present, weak GCS - 15  CT's Completed:   CT Head, CT C-Spine, CT Chest w/ contrast, and CT abdomen/pelvis w/ contrast   Interventions:  Labs Xrays CT scans 2 UPRBCs  2FFP   Plan for disposition:  Admission to floor   Event Summary:  Was cutting trees while standing on the top of a shed, was hit by a falling limb and knocked down. At the scene pt stated he could not feel his legs, on arrival he was moving his legs, full sensation to arms and legs, states he cannot move arms without pain.    Jerome Vaughn  Trauma Response RN  Please call TRN at [phone] for further assistance.

## 2024-04-15 NOTE — ED Provider Notes (Signed)
 [place] EMERGENCY DEPARTMENT AT [facility] Provider Note  MDM   HPI/ROS:  Jerome Vaughn is a 73 y.o. male with a medical history as below who arrives via EMS as a level 1 trauma after sustaining injuries after a tree limb fell on him  earlier this afternoon. History obtained from EMS and patient.  Briefly, the patient was standing on top of a tree cutting trees when a large tree limb fell on top of him and knocked him down. He was not knocked off the shed and did not have LOC or significant head injury. Per EMS, he was initially complaining of paralysis of both his legs, but upon ED arrival had full ROM, strength, and sensation of his b/l legs.   When the patient arrived, they were evaluated using standard ATLS protocol.  Airway, breathing, circulation were all confirmed and the patient's GCS was 15 at the time of arrival. A cervical collar was in place at the time of arrival. The patient was hemodynamically unstable with initial BP 74/38, HR 80s, satting well on RA.  Head to toe primary assessment was performed. Laboratory studies were obtained and imaging, including trauma scans and plain films were ordered, as detailed below.   Given the patient's clinical picture, Trauma Surgery was present at bedside for level 1.    Interpretations, interventions, and the patient's course of care are documented below.    Clinical Course as of 04/16/24 0115  Fri Apr 15, 2024  1713 DG Pelvis Portable No pelvic fracture. Bilateral hip osteoarthritis.   [AD]  1714 DG Chest Port 1 View No evidence of acute traumatic injury to the chest. [AD]    Clinical Course User Index [AD] Raoul Rake, MD     Medical Decision Making Patient with unknown PMHx who presents as a level 1 trauma with hypotension after a large tree branch landed on him while he was cutting down trees while standing on a shed earlier. He did not have LOC and per patient reportedly was not hit in his head, and did not  fall from atop the shed. He was hypotensive to 70s/30s on arrival and required 2 units pRBCs and 2 units FFP in the ED. He was also placed on levo per trauma surgery team shortly after initial evaluation/after he was accepted for admission to trauma service.  Patient's imaging workup as below resulted after patient was admitted to trauma for further inpatient management of his shock, with no significant traumatic injuries noted on initial workup.  Amount and/or Complexity of Data Reviewed Labs: ordered.    Details: Cr 1.43 (unknown baseline), WBC 2.6, Hb 11.6, Plts 100, otherwise remaining trauma labs unremarkable Radiology: ordered and independent interpretation performed. Decision-making details documented in ED Course.    Details: CT maxillofacial with soft tissue edema in the right cheek. No acute facial bone fracture. CT CAP with no evidence of acute traumatic injury to the chest, abdomen, or pelvis. CT C-spine with widening of the left C3-C4 facet, but no jumped or perched Facets, may be degenerative. Otherwise no acute fracture. CT head with slight frontal scalp edema. No acute intracranial abnormality. No skull fracture  Risk Prescription drug management. Decision regarding hospitalization.  Clinical Complexity A medically appropriate history, review of systems, and physical exam was performed.  My independent interpretations of EKG, labs, and radiology are documented in the ED course above.   Patient's presentation is most consistent with acute complicated illness / injury requiring diagnostic workup.  History reviewed. No pertinent past  medical history.    Physical Exam   Vitals:   04/15/24 2000 04/15/24 2100 04/15/24 2200 04/15/24 2332  BP: 110/76 (!) 143/88 127/82 117/75  Pulse: 82 69 70 72  Resp: 13 16 11 20   Temp:   97.7 F (36.5 C) 98 F (36.7 C)  TempSrc:   Oral Oral  SpO2: 100% 96% 98% 98%  Weight:      Height:        Physical Exam Gen: Alert Head: No  skull depressions or lacerations.  ENT: Pupils 2 mm equal, round, reactive to light. No conjunctival hemorrhage. No periorbital ecchymoses/racoon eyes or Battle sign bilaterally. Ears atraumatic. No nasal septal deviation or hematoma. Mouth and tongue atraumatic. Trachea midline.  Chest: Clavicles atraumatic, stable to anterior compression without crepitus. Chest wall with symmetric expansion, stable to anterior and lateral compression without crepitus.  Neck: No midline C-spine tenderness, step-offs, or deformities. C-collar in place. CV: RRR. DP, femoral, and radial pulses 2+ and equal bilaterally. Abdomen: Soft, non-distended, non-tender to palpation. No rebound or guarding. No abrasions/contusions. FAST negative. Back: No midline T- or L-spine tenderness, step-offs, or deformities. Neuro: Moving all extremities. GCS: 15. 5/5 strength in bilateral upper and lower extremities with sensation intact to light touch throughout.  MSK: Pelvis stable to anterior and lateral compression. Atraumatic with no gross deformities. Slowed but full ROM of bilateral shoulders iso chronic bilateral rotator cuff injuries per patient.   Disposition: Admit  Clinical Impression: Shock   The plan for this patient was discussed with Dr. Patt, who voiced agreement and who oversaw evaluation and treatment of this patient.    Please note that this documentation was produced with the assistance of voice-to-text technology and may contain errors.    Raoul Rake, MD 04/16/24 0117    Patt Alm Macho, MD 04/16/24 [phone]

## 2024-04-15 NOTE — TOC CM/SW Note (Signed)
 SW responded to Level 1 Trauma call, patient was cutting a tree branch when he was hit in head that lead falling 12-34ft. SW called and notified patient's wife [name] Strick [phone], she stated she was on the way to the ED.   At this time SW is not needed for any further Social Work needs unless requested.   .Avie Checo, MSW, LCSWA Transition of Care  Clinical Social Worker (ED 3-11 Mon-Fri)  [phone]

## 2024-04-15 NOTE — ED Triage Notes (Signed)
 Pt here as trauma. Pt fell from 34ft roof and had LOC. Initially BP with EMS was 80/50. Pt arrives in c-collar and dried blood on nose. Axox4. Pt is on 2L of O2.

## 2024-04-15 NOTE — ED Notes (Signed)
 Attempt x 4 to inform 5N that pt will be arriving to unit, including call placed to charge nurse phone. No answer received.

## 2024-04-15 NOTE — H&P (Addendum)
   TRAUMA H&P  04/15/2024, 5:05 PM   Chief Complaint: Level 1 trauma activation for hypotension  Primary Survey:  ABC's intact on arrival Arrived with c-collar in place  The patient is an 73 y.o. male.   HPI: 77M was on his roof, cutting up a tree branch that had fallen through the roof when the tree branch fell on him. He denies falling off the roof. SBP 80s en route for EMS. Rec'd 2u pRBC and 2FFP in ED with persistent hypotension. FAST negative, CXR/PXR without source of blood loss. Levophed  started.   No past medical history on file.  No pertinent family history.  Social History:  has no history on file for tobacco use, alcohol use, and drug use.    Allergies: Not on File  Medications: reviewed  No results found for this or any previous visit (from the past 48 hours).  No results found.  ROS 10 point review of systems is negative except as listed above in HPI.  There were no vitals taken for this visit.  Secondary Survey:  GCS: E(4)//V(5)//M(6) Constitutional: well-developed, well-nourished Skull: normocephalic, atraumatic Eyes: pupils equal, round, reactive to light, 2mm b/l, moist conjunctiva Face/ENT: midface stable without deformity, normal  dentition, external inspection of ears and nose normal, hearing intact  Oropharynx: normal oropharyngeal mucosa, no blood Neck: no thyromegaly, trachea midline, c-collar in place on arrival, no midline cervical tenderness to palpation, no C-spine stepoffs Chest: breath sounds equal bilaterally, normal  respiratory effort, no midline or lateral chest wall tenderness to palpation/deformity Abdomen: soft, NT, no bruising, no hepatosplenomegaly FAST: negative Pelvis: stable GU: no blood at urethral meatus of penis, no scrotal masses or abnormality Back: no wounds, no T/L spine TTP, no T/L spine stepoffs Rectal: deferred Extremities: 2+  radial and pedal pulses bilaterally, intact motor and sensation of bilateral UE and LE, no  peripheral edema MSK: unable to assess gait/station, no clubbing/cyanosis of fingers/toes, normal ROM of all four extremities Skin: warm, dry, no rashes  CXR in TB: sternal wires, mild cardiomegaly ad hilar infiltrate Pelvis XR in TB: unremarkable    Assessment/Plan:  Plan FFH  Shock - transfused 2+2, hgb 11.6, started on levophed , but able to be weaned off Widening of the left C3-C4 facet - nontender on exam, c-spine cleared FEN - regular diet DVT - SCDs, LMWH Dispo - Admit to floor, observation status  Clinical update provided to wife at bedside   Critical care time:  Dreama GEANNIE Hanger, MD General and Trauma Surgery [facility]

## 2024-04-15 NOTE — Progress Notes (Signed)
 Orthopedic Tech Progress Note Patient Details:  Jerome Vaughn 09-Nov-1950 [id number] Level 1 Trauma. Not needed Patient ID: Jerome Vaughn, male   DOB: 1951/09/26, 73 y.o.   MRN: [id number]  Jerome Vaughn 04/15/2024, 5:02 PM

## 2024-04-15 NOTE — Progress Notes (Signed)
   04/15/24 1700  Spiritual Encounters  Type of Visit Initial  Care provided to: Pt not available  Referral source Trauma page  Reason for visit Trauma  OnCall Visit Yes

## 2024-04-16 ENCOUNTER — Observation Stay (HOSPITAL_COMMUNITY)

## 2024-04-16 DIAGNOSIS — S199XXA Unspecified injury of neck, initial encounter: Secondary | ICD-10-CM | POA: Diagnosis not present

## 2024-04-16 DIAGNOSIS — M4802 Spinal stenosis, cervical region: Secondary | ICD-10-CM | POA: Diagnosis not present

## 2024-04-16 DIAGNOSIS — M50222 Other cervical disc displacement at C5-C6 level: Secondary | ICD-10-CM | POA: Diagnosis not present

## 2024-04-16 DIAGNOSIS — M5021 Other cervical disc displacement,  high cervical region: Secondary | ICD-10-CM | POA: Diagnosis not present

## 2024-04-16 LAB — BPAM RBC
Blood Product Expiration Date: 202508072359
Blood Product Expiration Date: 202508112359
ISSUE DATE / TIME: 202507181647
ISSUE DATE / TIME: 202507181647
Unit Type and Rh: 5100
Unit Type and Rh: 5100

## 2024-04-16 LAB — PREPARE FRESH FROZEN PLASMA
Unit division: 0
Unit division: 0

## 2024-04-16 LAB — BASIC METABOLIC PANEL WITH GFR
Anion gap: 7 (ref 5–15)
BUN: 18 mg/dL (ref 8–23)
CO2: 21 mmol/L — ABNORMAL LOW (ref 22–32)
Calcium: 8.9 mg/dL (ref 8.9–10.3)
Chloride: 106 mmol/L (ref 98–111)
Creatinine, Ser: 1.23 mg/dL (ref 0.61–1.24)
GFR, Estimated: >60 mL/min (ref >60)
Glucose, Bld: 166 mg/dL — ABNORMAL HIGH (ref 70–99)
Potassium: 4 mmol/L (ref 3.5–5.1)
Sodium: 134 mmol/L — ABNORMAL LOW (ref 135–145)

## 2024-04-16 LAB — CBC
HCT: 34.5 % — ABNORMAL LOW (ref 39.0–52.0)
Hemoglobin: 11.7 g/dL — ABNORMAL LOW (ref 13.0–17.0)
MCH: 29.5 pg (ref 26.0–34.0)
MCHC: 33.9 g/dL (ref 30.0–36.0)
MCV: 87.1 fL (ref 80.0–100.0)
Platelets: 100 K/uL — ABNORMAL LOW (ref 150–400)
RBC: 3.96 MIL/uL — ABNORMAL LOW (ref 4.22–5.81)
RDW: 14.5 % (ref 11.5–15.5)
WBC: 4.6 K/uL (ref 4.0–10.5)
nRBC: 0 % (ref 0.0–0.2)

## 2024-04-16 LAB — TYPE AND SCREEN
ABO/RH(D): A POS
Antibody Screen: NEGATIVE
Unit division: 0
Unit division: 0

## 2024-04-16 LAB — BPAM FFP
Blood Product Expiration Date: 202507272359
Blood Product Expiration Date: 202507272359
ISSUE DATE / TIME: 202507181657
ISSUE DATE / TIME: 202507181657
Unit Type and Rh: 6200
Unit Type and Rh: 6200

## 2024-04-16 LAB — GLUCOSE, CAPILLARY
Glucose-Capillary: 155 mg/dL — ABNORMAL HIGH (ref 70–99)
Glucose-Capillary: 162 mg/dL — ABNORMAL HIGH (ref 70–99)

## 2024-04-16 MED ORDER — EZETIMIBE 10 MG PO TABS
10.0000 mg | ORAL_TABLET | Freq: Every day | ORAL | Status: DC
Start: 2024-04-16 — End: 2024-04-21
  Administered 2024-04-16 – 2024-04-21 (×5): 10 mg via ORAL
  Filled 2024-04-16 (×5): qty 1

## 2024-04-16 MED ORDER — PANTOPRAZOLE SODIUM 40 MG PO TBEC
40.0000 mg | DELAYED_RELEASE_TABLET | Freq: Two times a day (BID) | ORAL | Status: DC
Start: 2024-04-16 — End: 2024-04-21
  Administered 2024-04-16 – 2024-04-21 (×10): 40 mg via ORAL
  Filled 2024-04-16 (×10): qty 1

## 2024-04-16 MED ORDER — RANOLAZINE ER 500 MG PO TB12
500.0000 mg | ORAL_TABLET | Freq: Two times a day (BID) | ORAL | Status: DC
Start: 2024-04-16 — End: 2024-04-21
  Administered 2024-04-16 – 2024-04-21 (×10): 500 mg via ORAL
  Filled 2024-04-16 (×13): qty 1

## 2024-04-16 MED ORDER — GADOBUTROL 1 MMOL/ML IV SOLN
8.0000 mL | Freq: Once | INTRAVENOUS | Status: AC | PRN
  Administered 2024-04-16: 8 mL via INTRAVENOUS

## 2024-04-16 MED ORDER — CARVEDILOL 3.125 MG PO TABS
3.1250 mg | ORAL_TABLET | Freq: Two times a day (BID) | ORAL | Status: DC
  Administered 2024-04-16 – 2024-04-21 (×11): 3.125 mg via ORAL
  Filled 2024-04-16 (×12): qty 1

## 2024-04-16 MED ORDER — METFORMIN HCL ER 500 MG PO TB24
1000.0000 mg | ORAL_TABLET | Freq: Two times a day (BID) | ORAL | Status: DC
  Administered 2024-04-16: 1000 mg via ORAL
  Filled 2024-04-16: qty 2

## 2024-04-16 MED ORDER — CHLORHEXIDINE GLUCONATE CLOTH 2 % EX PADS
6.0000 | MEDICATED_PAD | Freq: Every day | CUTANEOUS | Status: DC
  Administered 2024-04-16 – 2024-04-21 (×6): 6 via TOPICAL

## 2024-04-16 NOTE — Evaluation (Signed)
 Physical Therapy Evaluation Patient Details Name: Jerome Vaughn MRN: [id number] DOB: 1950/10/01 Today's Date: 04/16/2024  History of Present Illness  Jerome Vaughn is a 73 y.o. male who presented to [facility] 04/15/24 via EMS as a level 1 trauma for hypotension. He was on his roof cutting a tree branch when it fell on him. Rec'd 2u pRBC and 2FFP. C-spine cleared. Unknown PMHx.  Clinical Impression  Pt admitted with above diagnosis. PTA, pt was independent with functional mobility, ADLs/IADLs, driving, and working as a Lobbyist. He lives with his wife in a one story house with 3 STE. Pt currently with functional limitations due to the deficits listed below (see PT Problem List). He required minA for bed mobility, CGA for sit<>stand, and CGA-minA for taking a couple of steps with support from the IV pole. His knees buckled during ambulation attempt. PT safely returned him to bed. Unable to further gait. Pt needs to practice stairs to ensure he can safely enter his home. Pt will benefit from acute skilled PT to increase his independence and safety with mobility to allow discharge. Anticipate pt to progress well. Recommend HHPT to increase cardiopulmonary endurance, improve gait, decrease fall risk, and optimize safety within the home environment.       If plan is discharge home, recommend the following: A lot of help with walking and/or transfers;A little help with bathing/dressing/bathroom;Assistance with cooking/housework;Assist for transportation;Help with stairs or ramp for entrance   Can travel by private vehicle        Equipment Recommendations Wheelchair (measurements PT);BSC/3in1  Recommendations for Other Services       Functional Status Assessment Patient has had a recent decline in their functional status and demonstrates the ability to make significant improvements in function in a reasonable and predictable amount of time.     Precautions / Restrictions Precautions Precautions:  Fall Recall of Precautions/Restrictions: Intact Restrictions Weight Bearing Restrictions Per Provider Order: No      Mobility  Bed Mobility Overal bed mobility: Needs Assistance Bed Mobility: Supine to Sit, Sit to Supine     Supine to sit: Min assist Sit to supine: Min assist   General bed mobility comments: Pt sat up on the L side of bed from a flat surface. He brought BLE off EOB. Assist to elevate trunk and scoot hips fwd. Returning to bed pt controlled trunk and needed assist with lifting legs. Repositioned with use of bed features and bed pad.    Transfers Overall transfer level: Needs assistance Equipment used: None Transfers: Sit to/from Stand Sit to Stand: Contact guard assist           General transfer comment: Pt stood from lowest bed height. Powered up with CGA. Good eccentric control with sitting.    Ambulation/Gait Ambulation/Gait assistance: Contact guard assist, Min assist Gait Distance (Feet): 3 Feet Assistive device: IV Pole Gait Pattern/deviations: Step-to pattern, Decreased step length - right, Decreased step length - left, Decreased stride length, Knees buckling       General Gait Details: Pt took a couple of steps fwd/bkwd at EOB. He maintained upright posture, but was unsteady. Pt slowly advanced LEs, quickly fatigued, and then B knee buckled. MinA to regain stability and cues to increase knee ext. Guided pt quickly back to bed.  Stairs            Wheelchair Mobility     Tilt Bed    Modified Rankin (Stroke Patients Only)       Balance Overall balance assessment:  Needs assistance Sitting-balance support: No upper extremity supported, Feet supported Sitting balance-Leahy Scale: Fair Sitting balance - Comments: Pt sat EOB with supervision.   Standing balance support: Bilateral upper extremity supported, During functional activity Standing balance-Leahy Scale: Poor Standing balance comment: Pt dependent on external support and knees  buckled requiring minA to correct.                             Pertinent Vitals/Pain Pain Assessment Pain Assessment: 0-10 Pain Score: 5  Pain Location: Shoulders, Lt side of face, and Back of Head Pain Descriptors / Indicators: Discomfort, Aching, Grimacing, Tender, Sore Pain Intervention(s): Monitored during session, Limited activity within patient's tolerance, Repositioned    Home Living Family/patient expects to be discharged to:: Private residence Living Arrangements: Spouse/significant other;Children Available Help at Discharge: Family;Available 24 hours/[place] Type of Home: House Home Access: Stairs to enter Entrance Stairs-Rails: None Entrance Stairs-Number of Steps: 3   Home Layout: One level Home Equipment: Cane - single point      Prior Function Prior Level of Function : Independent/Modified Independent;Driving;Working/employed             Mobility Comments: Ambulates without AD. Pt reports occasionally wearing Rt knee brace. 1 fall leading to current admission. ADLs Comments: Indep with ADLs/IADLs. Pt reports some difficulty with buttoning shirts and needs assist with dressing occasionally. Work full-time as a Lobbyist. Completes yard work.     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 He kept eyes closed throughout the majority of the session for comfort. Denied light sensitivity but wears glasses all the time  and they're not present in the hospital. Following commands: Intact       Cueing Cueing Techniques: Verbal cues, Tactile cues     General Comments      Exercises     Assessment/Plan    PT Assessment Patient needs continued PT services  PT Problem List Decreased balance;Decreased mobility;Decreased activity tolerance;Decreased knowledge of use of DME;Decreased safety awareness       PT Treatment Interventions DME instruction;Gait training;Stair training;Functional mobility training;Therapeutic activities;Therapeutic exercise;Balance training;Patient/family education    PT Goals (Current goals can be found in the Care Plan section)  Acute Rehab PT Goals Patient Stated Goal: Have less pain, regain independence, and move better PT Goal Formulation: With patient/family Time For Goal Achievement: 04/30/24 Potential to Achieve Goals: Good    Frequency Min 2X/week     Co-evaluation               AM-PAC PT 6 Clicks Mobility  Outcome Measure Help needed turning from your back to your side while in a flat bed without using bedrails?: A Little Help needed moving from lying on your back to sitting on the side of a flat bed without using bedrails?: A Little Help needed moving to and from a bed to a chair (including  a wheelchair)?: A Little Help needed standing up from a chair using your arms (e.g., wheelchair or bedside chair)?: A Little Help needed to walk in hospital room?: A Lot Help needed climbing 3-5 steps with a railing? : A Lot 6 Click Score: 16    End of Session Equipment Utilized During Treatment: Gait belt Activity Tolerance: Patient tolerated treatment well;Patient limited by fatigue Patient left: in bed;with call bell/phone within reach;Other (comment) (with SLP present) Nurse Communication: Mobility status PT Visit Diagnosis: Difficulty in walking, not elsewhere classified (R26.2);Unsteadiness on feet (R26.81)    Time: 9261-9196 PT Time Calculation (min)  (ACUTE ONLY): 25 min   Charges:   PT Evaluation $PT Eval Moderate Complexity: 1 Mod   PT General Charges $$ ACUTE PT VISIT: 1 Visit         Randall SAUNDERS, PT, DPT Acute Rehabilitation Services Office: [phone] Secure Chat Preferred  Delon CHRISTELLA Callander 04/16/2024, 8:53 AM

## 2024-04-16 NOTE — Evaluation (Signed)
 Occupational Therapy Evaluation Patient Details Name: Jerome Vaughn MRN: [id number] DOB: 15-Jul-1951 Today's Date: 04/16/2024   History of Present Illness   Jerome Vaughn is a 73 y.o. male who presented to [facility] 04/15/24 via EMS as a level 1 trauma for hypotension. He was on his roof cutting a tree branch when it fell on him. Rec'd 2u pRBC and 2FFP. C-spine cleared. Unknown PMHx.     Clinical Impressions Pt c/o significant pain to BUEs and L neck, discomfort in abdomen area. Pt lives at home with wife who is available 24/7 who cannot physically assist with transfers but can help with light assist, and granddaughter who works fulltime as CNA 3pm-11pm. PLOF independent. Pt currently requires max A for ADLs as he is not able to use B hands functionally, little to no functional grip or FM skills, history of R hand numbness and poor FM skills. Pt min/mod A for sit to stand and transfer to [facility], not able to safely hold onto RW at this time but hopeful he continues to improve to safely transfer. At this time recommending wheelchair, BSC, and RW for return home, HHOT follow up. Will continue to see Pt acutely and update recs as Pt progresses.      If plan is discharge home, recommend the following:   A lot of help with walking and/or transfers;A lot of help with bathing/dressing/bathroom;Assistance with cooking/housework;Assistance with feeding;Assist for transportation;Help with stairs or ramp for entrance     Functional Status Assessment   Patient has had a recent decline in their functional status and demonstrates the ability to make significant improvements in function in a reasonable and predictable amount of time.     Equipment Recommendations   BSC/3in1;Other (comment);Wheelchair (measurements OT);Wheelchair cushion (measurements OT) (RW)     Recommendations for Other Services         Precautions/Restrictions   Precautions Precautions: Fall Recall of Precautions/Restrictions:  Intact Restrictions Weight Bearing Restrictions Per Provider Order: No     Mobility Bed Mobility Overal bed mobility: Needs Assistance Bed Mobility: Supine to Sit     Supine to sit: Mod assist     General bed mobility comments: mod A to assist sitting up, HOB elevated, poor grip    Transfers Overall transfer level: Needs assistance Equipment used: Rolling walker (2 wheels) Transfers: Sit to/from Stand, Bed to chair/wheelchair/BSC Sit to Stand: Min assist, Mod assist, +2 physical assistance     Step pivot transfers: Min assist, Mod assist, +2 physical assistance     General transfer comment: min/mod A x2 assist to get to [facility]      Balance Overall balance assessment: Needs assistance Sitting-balance support: No upper extremity supported, Feet supported Sitting balance-Leahy Scale: Fair     Standing balance support: Bilateral upper extremity supported, During functional activity, Reliant on assistive device for balance Standing balance-Leahy Scale: Poor                             ADL either performed or assessed with clinical judgement   ADL Overall ADL's : Needs assistance/impaired Eating/Feeding: Maximal assistance;Sitting;Bed level   Grooming: Maximal assistance;Sitting;Bed level   Upper Body Bathing: Maximal assistance;Sitting;Bed level   Lower Body Bathing: Maximal assistance;Sitting/lateral leans   Upper Body Dressing : Maximal assistance;Sitting;Bed level   Lower Body Dressing: Maximal assistance;Sitting/lateral leans;Sit to/from stand   Toilet Transfer: Minimal assistance;Moderate assistance;Rolling walker (2 wheels);BSC/3in1   Toileting- Clothing Manipulation and Hygiene: Maximal assistance;Sitting/lateral lean;Sit to/from stand  General ADL Comments: Pt max for ADLs as he does not have functional grip or FM skills. Pt min/mod A x2 assist for transfer to [facility], not able to hold RW safely     Vision Baseline Vision/History: 1  Wears glasses Ability to See in Adequate Light: 0 Adequate Patient Visual Report: No change from baseline       Perception         Praxis         Pertinent Vitals/Pain Pain Assessment Pain Assessment: 0-10 Pain Score: 8  Pain Location: LUE/RUE, L neck, restless legs     Extremity/Trunk Assessment Upper Extremity Assessment Upper Extremity Assessment: RUE deficits/detail;LUE deficits/detail RUE Deficits / Details: history of B RTC tears per Pt, currently able to lift arms with max effort to ~90 degrees. history of R hand numbness, poor FM skills. At this time no functional grip to B hands RUE: Shoulder pain with ROM RUE Sensation: history of peripheral neuropathy RUE Coordination: decreased fine motor;decreased gross motor LUE Deficits / Details: history of B RTC tears per Pt, currently able to lift arms with max effort to ~90 degrees. history of intermittant L hand numbness, poor FM skills. At this time no functional grip to B hands LUE: Shoulder pain with ROM LUE Sensation: history of peripheral neuropathy LUE Coordination: decreased fine motor;decreased gross motor           Communication Communication Communication: No apparent difficulties   Cognition Arousal: Alert Behavior During Therapy: WFL for tasks assessed/performed Cognition: No apparent impairments                               Following commands: Intact       Cueing  General Comments   Cueing Techniques: Verbal cues;Tactile cues      Exercises     Shoulder Instructions      Home Living Family/patient expects to be discharged to:: Private residence Living Arrangements: Spouse/significant other;Children Available Help at Discharge: Family;Available 24 hours/[place] Type of Home: House Home Access: Stairs to enter [employer] of Steps: 3 Entrance Stairs-Rails: None Home Layout: One level     Bathroom Shower/Tub: Producer, television/film/video: Standard     Home  Equipment: Cane - single point;Shower seat - built in   Additional Comments: Pt lives with wife who is available 24/7, granddaughter works as Lawyer at an ALF and is there at times.  Lives With: Spouse;Family    Prior Functioning/Environment Prior Level of Function : Independent/Modified Independent;Driving;Working/employed             Mobility Comments: Ambulates without AD. Pt reports occasionally wearing Rt knee brace. 1 fall leading to current admission. ADLs Comments: Indep with ADLs/IADLs. Pt reports some difficulty with buttoning shirts and needs assist with dressing occasionally. Work full-time as a Lobbyist. Completes yard work. Wife would help with buttons and shoes at times, R hand numbness.    OT Problem List: Decreased strength;Decreased range of motion;Decreased activity tolerance;Impaired balance (sitting and/or standing);Impaired UE functional use;Pain   OT Treatment/Interventions: Self-care/ADL training;Therapeutic exercise;Energy conservation;DME and/or AE instruction;Manual therapy;Therapeutic activities;Patient/family education;Balance training      OT Goals(Current goals can be found in the care plan section)   Acute Rehab OT Goals Patient Stated Goal: to impirove strength, manage pain OT Goal Formulation: With patient/family Time For Goal Achievement: 04/30/24 Potential to Achieve Goals: Good   OT Frequency:  Min 2X/week    Co-evaluation  AM-PAC OT 6 Clicks Daily Activity     Outcome Measure Help from another person eating meals?: A Lot Help from another person taking care of personal grooming?: Total Help from another person toileting, which includes using toliet, bedpan, or urinal?: A Lot Help from another person bathing (including washing, rinsing, drying)?: A Lot Help from another person to put on and taking off regular upper body clothing?: A Lot Help from another person to put on and taking off regular lower body  clothing?: A Lot 6 Click Score: 11   End of Session Equipment Utilized During Treatment: Gait belt;Rolling walker (2 wheels) Nurse Communication: Mobility status  Activity Tolerance: Patient limited by pain Patient left: in bed;with call bell/phone within reach;with nursing/sitter in room  OT Visit Diagnosis: Unsteadiness on feet (R26.81);Other abnormalities of gait and mobility (R26.89);Muscle weakness (generalized) (M62.81);Pain;Other symptoms and signs involving the nervous system (R29.898) Pain - part of body: Shoulder;Arm;Hand                Time: 8870-8782 OT Time Calculation (min): 48 min Charges:  OT General Charges $OT Visit: 1 Visit OT Evaluation $OT Eval Moderate Complexity: 1 Mod OT Treatments $Self Care/Home Management : 23-37 mins  [address] OTR/L   Elouise JONELLE Bott 04/16/2024, 12:21 PM

## 2024-04-16 NOTE — Progress Notes (Signed)
 Trauma Event Note    Rounding on pt--   Awake, sitting up in bed. Wife is feeding pt- he is unable to lift arms, c/o severe pain in bicep area. Pain with light touch to forearms and upper arms, weak hand grips bilaterally.  States he has been unable to urinate in urinal- did not want in and out cath-- wants to try to stand with 2 assist to urinate- priamry RN aware.   Trauma PA notified of arm pain and immobility.   Last imported Vital Signs BP (!) 140/85 (BP Location: Right Arm)   Pulse (!) 59   Temp 97.6 F (36.4 C) (Oral)   Resp 16   Ht 5' 10 (1.778 m)   Wt 168 lb (76.2 kg)   SpO2 99%   BMI 24.11 kg/m   Trending CBC Recent Labs    04/15/24 1647 04/15/24 1729 04/15/24 1809 04/16/24 0431  WBC 2.6*  --  6.3 4.6  HGB 11.6* 11.6* 11.7* 11.7*  HCT 35.4* 34.0* 35.7* 34.5*  PLT 100*  --  105* 100*    Trending Coag's Recent Labs    04/15/24 1647  INR 1.1    Trending BMET Recent Labs    04/15/24 1647 04/15/24 1729 04/15/24 1809 04/16/24 0431  NA 137 138  --  134*  K 4.2 4.3  --  4.0  CL 107 106  --  106  CO2 20*  --   --  21*  BUN 19 19  --  18  CREATININE 1.43* 1.40* 1.27* 1.23  GLUCOSE 177* 174*  --  166*      Jerome Jerome Vaughn  Trauma Response RN  Please call TRN at [phone] for further assistance.

## 2024-04-16 NOTE — Care Management (Cosign Needed)
    Durable Medical Equipment  (From admission, onward)           Start     Ordered   04/16/24 1253  For home use only DME lightweight manual wheelchair with seat cushion  Once       Comments: Patient suffers from Fall which impairs their ability to perform daily activities like bathing, dressing, feeding, grooming, and toileting in the home.  A cane, crutch, or walker will not resolve  issue with performing activities of daily living. A wheelchair will allow patient to safely perform daily activities. Patient is not able to propel themselves in the home using a standard weight wheelchair due to general weakness. Patient can self propel in the lightweight wheelchair. Length of need 6 months . Accessories: elevating leg rests (ELRs), wheel locks, extensions and anti-tippers.   04/16/24 1253   04/16/24 1252  For home use only DME Bedside commode  Once       Question:  Patient needs a bedside commode to treat with the following condition  Answer:  Weakness   04/16/24 1253   04/16/24 1252  For home use only DME 3 n 1  Once        04/16/24 1253           Per therapy recommendations.

## 2024-04-16 NOTE — TOC Transition Note (Addendum)
 Transition of Care [facility]) - Discharge Note   Patient Details  Name: Jerome Vaughn MRN: [id number] Date of Birth: August 04, 1951  Transition of Care [facility]) CM/SW Contact:  Robynn Eileen Hoose, RN Phone Number: 04/16/2024, 12:57 PM   Clinical Narrative:   Patient is being discharged today. Spoke with patient and family at bedside. Agreeable to [facility] services and DME recommendations. Message sent to [facility] with [facility] for [facility] PT OT, waiting for response. DME ordered through [facility] with [employer] to be delivered to bedside before discharging home.  1400: Cory with [facility] accepted pt.    Final next level of care: Home w Home Health Services Barriers to Discharge: No Barriers Identified   Patient Goals and CMS Choice            Discharge Placement                       Discharge Plan and Services Additional resources added to the After Visit Summary for                  DME Arranged: Lightweight manual wheelchair with seat cushion, Bedside commode, 3-N-1 DME Agency: [employer] Date DME Agency Contacted: 04/16/24 Time DME Agency Contacted: 1256 Representative spoke with at DME Agency: [facility] HH Arranged: PT, OT HH Agency: [facility] Date [facility] Agency Contacted: 04/16/24 Time HH Agency Contacted: 1248 Representative spoke with at [facility] Agency: Dar[facility]  Social Drivers of Health (SDOH) Interventions SDOH Screenings   Food Insecurity: No Food Insecurity (04/16/2024)  Housing: Low Risk  (04/16/2024)  Transportation Needs: No Transportation Needs (04/16/2024)  Social Connections: Socially Integrated (04/16/2024)  Tobacco Use: Low Risk  (04/15/2024)     Readmission Risk Interventions     No data to display

## 2024-04-16 NOTE — Progress Notes (Signed)
 [facility] Progress Note     Subjective: CC:  Reports bilateral upper extremity, specifically biceps, discomfort worse with palpation. Denies nausea or vomiting. Denies chest, abdomen, or lower extremity pain. 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   Objective: Vital signs in last 24 hours: Temp:  [97.3 F (36.3 C)-98 F (36.7 C)] 97.6 F (36.4 C) (07/19 0831) Pulse Rate:  [59-90] 59 (07/19 0831) Resp:  [11-22] 16 (07/19 0831) BP: (74-173)/(38-96) 140/85 (07/19 0831) SpO2:  [94 %-100 %] 99 % (07/19 0831) Weight:  [76.2 kg] 76.2 kg (07/18 1738) Last BM Date : 04/14/24  Intake/Output from previous [place]: 07/18 0701 - 07/19 0700 In: 22.5 [IV Piggyback:22.5] Out: -  Intake/Output this shift: Total I/O In: -  Out: 50 [Urine:50]  PE: Gen:  Alert, NAD, pleasant HEENT: pupils equal and reactive Card:  Regular rate and rhythm, no lower extremity edema Pulm:  Normal effort ORA Abd: Soft, non-tender, non-distended MSK: paresthesias LUE, grip strength 5/5, able to perform elbow flexion and extension but slowly; limited shoulder mobility at baseline due to rotator cuff injury RUQ grip strength 2/5, no paresthesia, elbow ROM in tact, shoulder ROM limited by baseline rotator cuff tear BLE without deformity or edema, NVI Skin: warm and dry, no rashes  Psych: A&Ox3   Lab Results:  Recent Labs    04/15/24 1809 04/16/24 0431  WBC 6.3 4.6  HGB 11.7* 11.7*  HCT 35.7* 34.5*  PLT 105* 100*   BMET Recent Labs    04/15/24 1647 04/15/24 1729 04/15/24 1809 04/16/24 0431  NA 137 138  --  134*  K 4.2 4.3  --  4.0  CL 107 106  --  106  CO2 20*  --   --  21*  GLUCOSE 177* 174*  --  166*  BUN 19 19  --  18  CREATININE 1.43* 1.40* 1.27* 1.23  CALCIUM  8.4*  --   --  8.9   PT/INR Recent Labs    04/15/24 1647  LABPROT 15.2  INR 1.1   CMP     Component Value Date/Time   NA 134 (L) 04/16/2024 0431   K 4.0 04/16/2024 0431   CL 106 04/16/2024 0431    CO2 21 (L) 04/16/2024 0431   GLUCOSE 166 (H) 04/16/2024 0431   BUN 18 04/16/2024 0431   CREATININE 1.23 04/16/2024 0431   CALCIUM  8.9 04/16/2024 0431   PROT 5.8 (L) 04/15/2024 1647   ALBUMIN 3.0 (L) 04/15/2024 1647   AST 28 04/15/2024 1647   ALT 27 04/15/2024 1647   ALKPHOS 24 (L) 04/15/2024 1647   BILITOT 0.7 04/15/2024 1647   GFRNONAA >60 04/16/2024 0431   Lipase  No results found for: LIPASE     Studies/Results: DG Shoulder Right Port Result Date: 04/15/2024 CLINICAL DATA:  855384 Pain [id number] EXAM: RIGHT SHOULDER - 1 VIEW COMPARISON:  None Available. FINDINGS: No acute fracture or dislocation. Mild glenohumeral and moderate AC joint space loss. Soft tissues are unremarkable. Multilevel thoracic osteophytosis. IMPRESSION: 1. No acute fracture or dislocation. 2. Glenohumeral and AC joint osteoarthritis, as described above. Electronically Signed   By: Rogelia Myers M.D.   On: 04/15/2024 19:28   DG Shoulder Left Port Result Date: 04/15/2024 CLINICAL DATA:  855384 Pain [id number] EXAM: LEFT SHOULDER COMPARISON:  March 01, 2009 FINDINGS: No acute fracture or dislocation. Moderate to severe joint space loss of the AC joint. Sternotomy wires and CABG markers. Aortic atherosclerosis. IMPRESSION: 1. No acute fracture or dislocation. 2.  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 Electronically Signed   By: Rogelia Myers M.D.   On: 04/15/2024 19:27   CT Maxillofacial Wo Contrast Result Date: 04/15/2024 CLINICAL DATA:  Trauma, fall. EXAM: CT MAXILLOFACIAL WITHOUT CONTRAST TECHNIQUE: Multidetector CT imaging of the maxillofacial structures was performed. Multiplanar CT image reconstructions were also generated. RADIATION DOSE REDUCTION: This exam was performed according to the departmental dose-optimization program which includes automated exposure control, adjustment of the mA and/or kV according to patient size and/or use of iterative reconstruction technique. COMPARISON:  None Available.  FINDINGS: Osseous: No acute fracture of the nasal bone, zygomatic arches or mandibles. Temporomandibular joints are congruent. Intact maxilla and pterygoid plates. Patient is edentulous of upper teeth. Orbits: No acute orbital fracture.  No evidence of globe injury. Sinuses: No sinus fracture or hemosinus. Paranasal sinuses are clear. Soft tissues: Soft tissue edema in the right cheek. Limited intracranial: Assessed on concurrent head CT, reported separately. IMPRESSION: Soft tissue edema in the right cheek. No acute facial bone fracture. Electronically Signed   By: Andrea Gasman M.D.   On: 04/15/2024 17:37   CT CHEST ABDOMEN PELVIS W CONTRAST Result Date: 04/15/2024 CLINICAL DATA:  Blunt trauma.  Hypotension. EXAM: CT CHEST, ABDOMEN, AND PELVIS WITH CONTRAST TECHNIQUE: Multidetector CT imaging of the chest, abdomen and pelvis was performed following the standard protocol during bolus administration of intravenous contrast. RADIATION DOSE REDUCTION: This exam was performed according to the departmental dose-optimization program which includes automated exposure control, adjustment of the mA and/or kV according to patient size and/or use of iterative reconstruction technique. CONTRAST:  75mL OMNIPAQUE  IOHEXOL  350 MG/ML SOLN COMPARISON:  Chest and pelvic radiographs earlier today. Chest CT 12/05/2015 reviewed. Abdominal ultrasound 02/21/2020 FINDINGS: CT CHEST FINDINGS Cardiovascular: No evidence of acute aortic or vascular injury. Aortic atherosclerosis and tortuosity. No aortic aneurysm. Prior CABG with dense calcification of native coronary arteries. The heart is enlarged. No pericardial effusion. Mediastinum/Nodes: No mediastinal hemorrhage or hematoma. No pneumomediastinum. Small hiatal hernia. No mediastinal or hilar adenopathy. No visible thyroid nodule Lungs/Pleura: No pneumothorax. No evidence of pulmonary contusion. Scarring in the medial right lower lobe adjacent to thoracic osteophytes. No pleural  effusion. The trachea and central airways are clear. Musculoskeletal: Prior median sternotomy. No acute sternal fracture. No evidence of acute fracture of the ribs, included clavicles or shoulder girdles. Remote fracture of the right anterior tenth rib. Degenerative change throughout the thoracic spine without acute fracture. There is no confluent chest wall contusion CT ABDOMEN PELVIS FINDINGS Hepatobiliary: No hepatic injury or perihepatic hematoma. Gallbladder is unremarkable. Pancreas: No evidence of injury. No ductal dilatation or inflammation. Spleen: No splenic injury or perisplenic hematoma. Adrenals/Urinary Tract: No adrenal hemorrhage or renal injury identified. Partially exophytic low-density lesion from the lateral right kidney measuring approximately 5.6 cm likely represents a complex cyst, appeared cystic on prior ultrasound. Hounsfield units are 27. No further follow-up imaging is recommended. Nonobstructing stone in the upper left kidney. Bladder is unremarkable. Stomach/Bowel: No evidence of bowel injury or mesenteric hematoma. Small hiatal hernia. There is no bowel wall thickening, inflammation or obstruction. Normal appendix visualized. There is scattered colonic diverticula without diverticulitis. Vascular/Lymphatic: Aortic and branch atherosclerosis. No evidence of vascular injury. No retroperitoneal fluid. Smooth contours of the IVC. No adenopathy. Reproductive: Prostate is unremarkable. Other: No intra-abdominal free air or free fluid. There is no confluent body wall contusion. Musculoskeletal: No acute fracture of the lumbar spine or pelvis. Mild multilevel generative change in the lumbar spine. There is bilateral  hip osteoarthritis. IMPRESSION: 1. No evidence of acute traumatic injury to the chest, abdomen, or pelvis. 2. Cardiomegaly with prior CABG. 3. Nonobstructing left renal stone. 4. Colonic diverticulosis without diverticulitis. Aortic Atherosclerosis (ICD10-I70.0). Electronically  Signed   By: Andrea Gasman M.D.   On: 04/15/2024 17:35   CT CERVICAL SPINE WO CONTRAST Result Date: 04/15/2024 CLINICAL DATA:  Blunt trauma.  Fall. EXAM: CT CERVICAL SPINE WITHOUT CONTRAST TECHNIQUE: Multidetector CT imaging of the cervical spine was performed without intravenous contrast. Multiplanar CT image reconstructions were also generated. RADIATION DOSE REDUCTION: This exam was performed according to the departmental dose-optimization program which includes automated exposure control, adjustment of the mA and/or kV according to patient size and/or use of iterative reconstruction technique. COMPARISON:  None Available. FINDINGS: Alignment: There is widening of the left C3-C4 facet, but no jumped or perched facets. Trace anterolisthesis of C7 on T1. Skull base and vertebrae: No acute fracture. Vertebral body heights are maintained. The dens and skull base are intact. Degenerative pannus at C1-C2. Soft tissues and spinal canal: No prevertebral fluid or swelling. No visible canal hematoma. Disc levels: Multilevel degenerative disc disease, most prominently affecting C6-C7. Moderate multilevel facet hypertrophy. Upper chest: Assessed on concurrent chest CT, reported separately. Other: None. IMPRESSION: 1. Widening of the left C3-C4 facet, but no jumped or perched facets. This may be degenerative, however if there is clinical concern for ligamentous injury, MRI is recommended. 2. No acute fracture of the cervical spine. 3. Multilevel degenerative disc disease and facet hypertrophy. Electronically Signed   By: Andrea Gasman M.D.   On: 04/15/2024 17:27   CT HEAD WO CONTRAST Result Date: 04/15/2024 CLINICAL DATA:  Trauma, fall. EXAM: CT HEAD WITHOUT CONTRAST TECHNIQUE: Contiguous axial images were obtained from the base of the skull through the vertex without intravenous contrast. RADIATION DOSE REDUCTION: This exam was performed according to the departmental dose-optimization program which includes  automated exposure control, adjustment of the mA and/or kV according to patient size and/or use of iterative reconstruction technique. COMPARISON:  None Available. FINDINGS: Brain: No intracranial hemorrhage, mass effect, or midline shift. No hydrocephalus. The basilar cisterns are patent. No evidence of territorial infarct or acute ischemia. No extra-axial or intracranial fluid collection. Vascular: No hyperdense vessel or unexpected calcification. Skull: No fracture or focal lesion. Sinuses/Orbits: Assessed on concurrent face CT, reported separately. Minimal opacification of lower left mastoid air cells. Other: Slight frontal scalp edema without confluent hematoma. IMPRESSION: Slight frontal scalp edema. No acute intracranial abnormality. No skull fracture. Electronically Signed   By: Andrea Gasman M.D.   On: 04/15/2024 17:24   DG Pelvis Portable Result Date: 04/15/2024 CLINICAL DATA:  Trauma, fall. EXAM: PORTABLE PELVIS 1-2 VIEWS COMPARISON:  None Available. FINDINGS: The cortical margins of the bony pelvis are intact. No fracture. Pubic symphysis and sacroiliac joints are congruent. Both femoral heads are well-seated in the respective acetabula. Bilateral hip osteoarthritis. IMPRESSION: 1. No pelvic fracture. 2. Bilateral hip osteoarthritis. Electronically Signed   By: Andrea Gasman M.D.   On: 04/15/2024 17:09   DG Chest Port 1 View Result Date: 04/15/2024 CLINICAL DATA:  Trauma, fall. EXAM: PORTABLE CHEST 1 VIEW COMPARISON:  Radiograph 07/03/2023 FINDINGS: Right lateral chest in both lung bases are not entirely included in the field of view. Prior median sternotomy. Stable heart size and mediastinal contours. No evidence of pneumothorax, large pleural effusion or confluent opacity. No grossly displaced fracture is seen. IMPRESSION: No evidence of acute traumatic injury to the chest. Electronically Signed  By: Andrea Gasman M.D.   On: 04/15/2024 17:08    Anti-infectives: Anti-infectives (From  admission, onward)    None      Assessment/Plan 73 y/o M s/p fall from roof after he was hit with a tree branch Shock - transfused 2+2 7/18, hgb 11.7 from 11.6, initially required levophed , but able to be weaned off Widening of the left C3-C4 facet - nontender on exam, having minor symptoms of central cord this morning with decreased grip strength RUE and neuropathic pain LUE. MRI ordered. NS consulted (thomas), gabapentin  ordered  FEN - regular diet DVT - SCDs, LMWH Dispo - Admit to floor, observation, PT/OT  DM - re-order home meds    LOS: 0 days   I reviewed nursing notes, ED provider notes, last 24 h vitals and pain scores, last 48 h intake and output, last 24 h labs and trends, and last 24 h imaging results.  This care required moderate level of medical decision making.   Almarie Pringle, PA-C [facility] Please see Amion for pager number during Christmas hours 7:00am-4:30pm

## 2024-04-16 NOTE — Evaluation (Signed)
 Speech Language Pathology Evaluation Patient Details Name: Elija Nick MRN: [id number] DOB: 1951-04-08 Today's Date: 04/16/2024 Time: 0803-0820 SLP Time Calculation (min) (ACUTE ONLY): 17 min  Problem List:  Patient Active Problem List   Diagnosis Date Noted   Shock (HCC) 04/15/2024   Past Medical History: History reviewed. No pertinent past medical history. Past Surgical History: The histories are not reviewed yet. Please review them in the History navigator section and refresh this SmartLink. HPI:  Brevin Woolen is a 73 y.o. male who presented to [facility] 04/15/24 via EMS as a level 1 trauma for hypotension. He was on his roof cutting a tree branch when it fell on him. Rec'd 2u pRBC and 2FFP. C-spine cleared. Unknown PMHx.   Assessment / Plan / Recommendation Clinical Impression  Pt appears to be at or near his cognitive baseline and both he and his wife deny any acute cognitive or communicative changes. He scored 23/24 points on subtests given on the SLUMS (pt not able to hold a pen, asked to defer the written portions). He also demonstrated some appropriate intellectual and online awareness related to acute physical changes, anticipating assistance that he may need upon return home. Do not anticipate that he will need any ongoing SLP services.      SLP Assessment  SLP Recommendation/Assessment: Patient does not need any further Speech Language Pathology Services SLP Visit Diagnosis: Cognitive communication deficit (R41.841)     Assistance Recommended at Discharge  PRN  Functional Status Assessment Patient has not had a recent decline in their functional status  Frequency and Duration           SLP Evaluation Cognition  Overall Cognitive Status: Within Functional Limits for tasks assessed Orientation Level: Oriented X4       Comprehension  Auditory Comprehension Overall Auditory Comprehension: Appears within functional limits for tasks assessed    Expression Expression Primary  Mode of Expression: Verbal Verbal Expression Overall Verbal Expression: Appears within functional limits for tasks assessed   Oral / Motor  Motor Speech Overall Motor Speech: Appears within functional limits for tasks assessed            Leita SAILOR., M.A. CCC-SLP Acute Rehabilitation Services Office: [phone]  Secure chat preferred  04/16/2024, 9:11 AM

## 2024-04-16 NOTE — Care Management Obs Status (Signed)
 MEDICARE OBSERVATION STATUS NOTIFICATION   Patient Details  Name: Jerome Vaughn MRN: [id number] Date of Birth: 1950/10/24   Medicare Observation Status Notification Given:  Yes    Robynn Eileen Hoose, RN 04/16/2024, 9:14 AM

## 2024-04-17 LAB — HEMOGLOBIN A1C
Hgb A1c MFr Bld: 6.4 % — ABNORMAL HIGH (ref 4.8–5.6)
Mean Plasma Glucose: 136.98 mg/dL

## 2024-04-17 LAB — GLUCOSE, CAPILLARY
Glucose-Capillary: 146 mg/dL — ABNORMAL HIGH (ref 70–99)
Glucose-Capillary: 189 mg/dL — ABNORMAL HIGH (ref 70–99)

## 2024-04-17 MED ORDER — HYDROMORPHONE HCL 1 MG/ML IJ SOLN: 0.5000 mg | INTRAMUSCULAR | Status: DC | PRN

## 2024-04-17 MED ORDER — GABAPENTIN 300 MG PO CAPS
300.0000 mg | ORAL_CAPSULE | Freq: Three times a day (TID) | ORAL | Status: DC
  Administered 2024-04-17 – 2024-04-21 (×13): 300 mg via ORAL
  Filled 2024-04-17 (×13): qty 1

## 2024-04-17 MED ORDER — INSULIN ASPART 100 UNIT/ML IJ SOLN
0.0000 [IU] | Freq: Every day | INTRAMUSCULAR | Status: DC
  Administered 2024-04-19 – 2024-04-20 (×2): 2 [IU] via SUBCUTANEOUS

## 2024-04-17 MED ORDER — INSULIN ASPART 100 UNIT/ML IJ SOLN
0.0000 [IU] | Freq: Three times a day (TID) | INTRAMUSCULAR | Status: DC
  Administered 2024-04-17: 3 [IU] via SUBCUTANEOUS
  Administered 2024-04-18: 8 [IU] via SUBCUTANEOUS
  Administered 2024-04-18: 2 [IU] via SUBCUTANEOUS
  Administered 2024-04-18: 3 [IU] via SUBCUTANEOUS
  Administered 2024-04-19: 2 [IU] via SUBCUTANEOUS
  Administered 2024-04-19: 3 [IU] via SUBCUTANEOUS
  Administered 2024-04-19: 2 [IU] via SUBCUTANEOUS
  Administered 2024-04-20 (×2): 3 [IU] via SUBCUTANEOUS
  Administered 2024-04-20: 2 [IU] via SUBCUTANEOUS
  Administered 2024-04-21: 5 [IU] via SUBCUTANEOUS
  Administered 2024-04-21 (×2): 3 [IU] via SUBCUTANEOUS

## 2024-04-17 NOTE — Progress Notes (Signed)

## 2024-04-17 NOTE — Consult Note (Signed)
 HPI:     Patient is a 73 y.o. male presented to ED after fall working on a roof after being struck by some branches. Currently c/o weakness in RUE and dysesthesias to LUE w/ decreased grip strength. No BLE symptoms, B&B dysfunction, recent gait change, SA.     Patient Active Problem List   Diagnosis Date Noted   Shock (HCC) 04/15/2024      Medications Prior to Admission  Medication Sig Dispense Refill Last Dose/Taking   benazepril (LOTENSIN) 20 MG tablet Take 20 mg by mouth daily.   04/15/2024 Morning   carvedilol  (COREG ) 3.125 MG tablet Take 3.125 mg by mouth 2 (two) times daily.   04/15/2024 Morning   ezetimibe  (ZETIA ) 10 MG tablet Take 10 mg by mouth daily.   04/15/2024 Morning   meloxicam (MOBIC) 15 MG tablet Take 15 mg by mouth daily as needed for pain.   Past Week   metFORMIN  (GLUCOPHAGE -XR) 500 MG 24 hr tablet Take 1,000 mg by mouth 2 (two) times daily.   04/15/2024 Morning   nitroGLYCERIN (NITROSTAT) 0.4 MG SL tablet Place 0.4 mg under the tongue every 5 (five) minutes as needed for chest pain.   Unknown   OZEMPIC, 1 MG/DOSE, 4 MG/3ML SOPN Inject 0.5 mg into the skin once a week.   04/10/2024   pantoprazole  (PROTONIX ) 40 MG tablet Take 40 mg by mouth 2 (two) times daily.   04/15/2024 Morning   ranolazine  (RANEXA ) 500 MG 12 hr tablet Take 500 mg by mouth 2 (two) times daily.   04/15/2024 Morning   rosuvastatin (CRESTOR) 40 MG tablet Take 40 mg by mouth daily.   04/15/2024 Morning   No Known Allergies  Social History   Tobacco Use   Smoking status: Never   Smokeless tobacco: Never  Substance Use Topics   Alcohol use: Not on file      Objective:   Patient Vitals for the past 8 hrs:  BP Temp Temp src Pulse Resp SpO2  04/17/24 0752 (!) 154/86 97.8 F (36.6 C) Oral 62 -- 96 %  04/17/24 0540 (!) 160/87 97.8 F (36.6 C) Oral (!) 58 18 98 %   I/O last 3 completed shifts: In: 240 [P.O.:240] Out: 4500 [Urine:4500] No intake/output data recorded.  MR CERVICAL SPINE W WO  CONTRAST Result Date: 04/16/2024 CLINICAL DATA:  Trauma EXAM: MRI CERVICAL SPINE WITHOUT AND WITH CONTRAST TECHNIQUE: Multiplanar and multiecho pulse sequences of the cervical spine, to include the craniocervical junction and cervicothoracic junction, were obtained without and with intravenous contrast. CONTRAST:  8mL GADAVIST  GADOBUTROL  1 MMOL/ML IV SOLN COMPARISON:  Cervical spine CT 04/15/2024 with FINDINGS: Alignment: Physiologic. Vertebrae: No fracture, evidence of discitis, or bone lesion. No ligamentous interruption Cord: Focal hyperintense T2-weighted signal within the spinal cord at the C3-4 levels and at C7. Posterior Fossa, vertebral arteries, paraspinal tissues: Left mastoid fluid Disc levels: C1-2: Unremarkable. C2-3: Moderate left facet hypertrophy with left-greater-than-right uncovertebral spurring. There is no spinal canal stenosis. Moderate left neural foraminal stenosis. C3-4: Intermediate sized disc bulge with bilateral uncovertebral hypertrophy. Mild spinal canal stenosis. Severe bilateral neural foraminal stenosis. C4-5: Left-greater-than-right uncovertebral spurring and mild facet hypertrophy. There is no spinal canal stenosis. Moderate left neural foraminal stenosis. C5-6: Intermediate sized disc bulge. Mild spinal canal stenosis. Moderate bilateral neural foraminal stenosis. C6-7: Intermediate sized disc osteophyte complex. Severe spinal canal stenosis. Severe bilateral neural foraminal stenosis. C7-T1: Normal disc space and facet joints. There is no spinal canal stenosis. No neural foraminal stenosis. IMPRESSION: 1. No  acute fracture or ligamentous injury of the cervical spine. 2. Focal hyperintense T2-weighted signal within the spinal cord at the C3-4 levels and at C7, which may indicate myelomalacia or spinal cord edema. 3. Severe spinal canal stenosis and bilateral neural foraminal stenosis at C6-7. 4. Mild spinal canal stenosis and severe bilateral neural foraminal stenosis at C3-4. 5.  Moderate left C2-3, left C4-5 and bilateral C5-6 neural foraminal stenosis. Electronically Signed   By: Franky Stanford M.D.   On: 04/16/2024 22:56     Awake, alert, oriented Speech fluent, appropriate 5/5 BLE RUE 2/5 distally, 3/5 proximally LUE 3/5 distally, 4/5 proximally SILT x4 Neg clonus Neg hoffman DTR 3+ BLEs, LUE. 2+ RUE Continue soft collar    Assessment:   Principal Problem:   Shock (HCC)  This is a 55 male with cervical spinal stenosis most significant at C6-7 with cord edema at C3-4 following fall from roof with BUE weakness  Plan:   -Plan for OR for anterior fusion/decompression C6-7 on 04/19/24 vs 04/20/24 -Continue supportive care -Continue soft collar  Lariya Kinzie CAYLIN Raley Novicki, PA-C

## 2024-04-17 NOTE — Progress Notes (Addendum)
 [facility] Progress Note     Subjective: CC:  Reports bilateral upper extremity, specifically biceps, discomfort worse with palpation.  Also feels like he may have slept on twisted his shoulder wrong in the right side.  He has a history of rotator cuff issues.  Ongoing upper extremity weakness.  Denies nausea or vomiting. Denies chest, abdomen, or lower extremity pain. 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   Objective: Vital signs in last 24 hours: Temp:  [97.2 F (36.2 C)-97.8 F (36.6 C)] 97.8 F (36.6 C) (07/20 0540) Pulse Rate:  [58-69] 58 (07/20 0540) Resp:  [16-18] 18 (07/20 0540) BP: (125-160)/(69-87) 160/87 (07/20 0540) SpO2:  [97 %-99 %] 98 % (07/20 0540) Last BM Date : 04/14/24  Intake/Output from previous [place]: 07/19 0701 - 07/20 0700 In: 240 [P.O.:240] Out: 4500 [Urine:4500] Intake/Output this shift: Total I/O In: -  Out: 1300 [Urine:1300]  PE: Gen:  Alert, NAD, pleasant HEENT: pupils equal and reactive Card:  Regular rate and rhythm, no lower extremity edema Pulm:  Normal effort ORA Abd: Soft, non-tender, non-distended MSK: paresthesias LUE, grip strength 3/5, able to perform elbow flexion and extension but slowly; limited shoulder mobility at baseline due to rotator cuff injury RUQ grip strength 1-2/5, no paresthesia, elbow ROM in tact, shoulder ROM limited by baseline rotator cuff tear BLE without deformity or edema, NVI Skin: warm and dry, no rashes  Psych: A&Ox3   Lab Results:  Recent Labs    04/15/24 1809 04/16/24 0431  WBC 6.3 4.6  HGB 11.7* 11.7*  HCT 35.7* 34.5*  PLT 105* 100*   BMET Recent Labs    04/15/24 1647 04/15/24 1729 04/15/24 1809 04/16/24 0431  NA 137 138  --  134*  K 4.2 4.3  --  4.0  CL 107 106  --  106  CO2 20*  --   --  21*  GLUCOSE 177* 174*  --  166*  BUN 19 19  --  18  CREATININE 1.43* 1.40* 1.27* 1.23  CALCIUM  8.4*  --   --  8.9   PT/INR Recent Labs    04/15/24 1647  LABPROT  15.2  INR 1.1   CMP     Component Value Date/Time   NA 134 (L) 04/16/2024 0431   K 4.0 04/16/2024 0431   CL 106 04/16/2024 0431   CO2 21 (L) 04/16/2024 0431   GLUCOSE 166 (H) 04/16/2024 0431   BUN 18 04/16/2024 0431   CREATININE 1.23 04/16/2024 0431   CALCIUM  8.9 04/16/2024 0431   PROT 5.8 (L) 04/15/2024 1647   ALBUMIN 3.0 (L) 04/15/2024 1647   AST 28 04/15/2024 1647   ALT 27 04/15/2024 1647   ALKPHOS 24 (L) 04/15/2024 1647   BILITOT 0.7 04/15/2024 1647   GFRNONAA >60 04/16/2024 0431   Lipase  No results found for: LIPASE     Studies/Results: MR CERVICAL SPINE W WO CONTRAST Result Date: 04/16/2024 CLINICAL DATA:  Trauma EXAM: MRI CERVICAL SPINE WITHOUT AND WITH CONTRAST TECHNIQUE: Multiplanar and multiecho pulse sequences of the cervical spine, to include the craniocervical junction and cervicothoracic junction, were obtained without and with intravenous contrast. CONTRAST:  8mL GADAVIST  GADOBUTROL  1 MMOL/ML IV SOLN COMPARISON:  Cervical spine CT 04/15/2024 with FINDINGS: Alignment: Physiologic. Vertebrae: No fracture, evidence of discitis, or bone lesion. No ligamentous interruption Cord: Focal hyperintense T2-weighted signal within the spinal cord at the C3-4 levels and at C7. Posterior Fossa, vertebral arteries, paraspinal tissues: Left mastoid fluid Disc levels: C1-2:  Unremarkable. C2-3: Moderate left facet hypertrophy with left-greater-than-right uncovertebral spurring. There is no spinal canal stenosis. Moderate left neural foraminal stenosis. C3-4: Intermediate sized disc bulge with bilateral uncovertebral hypertrophy. Mild spinal canal stenosis. Severe bilateral neural foraminal stenosis. C4-5: Left-greater-than-right uncovertebral spurring and mild facet hypertrophy. There is no spinal canal stenosis. Moderate left neural foraminal stenosis. C5-6: Intermediate sized disc bulge. Mild spinal canal stenosis. Moderate bilateral neural foraminal stenosis. C6-7: Intermediate sized  disc osteophyte complex. Severe spinal canal stenosis. Severe bilateral neural foraminal stenosis. C7-T1: Normal disc space and facet joints. There is no spinal canal stenosis. No neural foraminal stenosis. IMPRESSION: 1. No acute fracture or ligamentous injury of the cervical spine. 2. Focal hyperintense T2-weighted signal within the spinal cord at the C3-4 levels and at C7, which may indicate myelomalacia or spinal cord edema. 3. Severe spinal canal stenosis and bilateral neural foraminal stenosis at C6-7. 4. Mild spinal canal stenosis and severe bilateral neural foraminal stenosis at C3-4. 5. Moderate left C2-3, left C4-5 and bilateral C5-6 neural foraminal stenosis. Electronically Signed   By: Franky Stanford M.D.   On: 04/16/2024 22:56   DG Shoulder Right Port Result Date: 04/15/2024 CLINICAL DATA:  [id number] Pain 144615 EXAM: RIGHT SHOULDER - 1 VIEW COMPARISON:  None Available. FINDINGS: No acute fracture or dislocation. Mild glenohumeral and moderate AC joint space loss. Soft tissues are unremarkable. Multilevel thoracic osteophytosis. IMPRESSION: 1. No acute fracture or dislocation. 2. Glenohumeral and AC joint osteoarthritis, as described above. Electronically Signed   By: Rogelia Myers M.D.   On: 04/15/2024 19:28   DG Shoulder Left Port Result Date: 04/15/2024 CLINICAL DATA:  855384 Pain 855384 EXAM: LEFT SHOULDER COMPARISON:  March 01, 2009 FINDINGS: No acute fracture or dislocation. Moderate to severe joint space loss of the AC joint. Sternotomy wires and CABG markers. Aortic atherosclerosis. IMPRESSION: 1. No acute fracture or dislocation. 2. 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 Electronically Signed   By: Rogelia Myers M.D.   On: 04/15/2024 19:27   CT Maxillofacial Wo Contrast Result Date: 04/15/2024 CLINICAL DATA:  Trauma, fall. EXAM: CT MAXILLOFACIAL WITHOUT CONTRAST TECHNIQUE: Multidetector CT imaging of the maxillofacial structures was performed. Multiplanar CT image  reconstructions were also generated. RADIATION DOSE REDUCTION: This exam was performed according to the departmental dose-optimization program which includes automated exposure control, adjustment of the mA and/or kV according to patient size and/or use of iterative reconstruction technique. COMPARISON:  None Available. FINDINGS: Osseous: No acute fracture of the nasal bone, zygomatic arches or mandibles. Temporomandibular joints are congruent. Intact maxilla and pterygoid plates. Patient is edentulous of upper teeth. Orbits: No acute orbital fracture.  No evidence of globe injury. Sinuses: No sinus fracture or hemosinus. Paranasal sinuses are clear. Soft tissues: Soft tissue edema in the right cheek. Limited intracranial: Assessed on concurrent head CT, reported separately. IMPRESSION: Soft tissue edema in the right cheek. No acute facial bone fracture. Electronically Signed   By: Andrea Gasman M.D.   On: 04/15/2024 17:37   CT CHEST ABDOMEN PELVIS W CONTRAST Result Date: 04/15/2024 CLINICAL DATA:  Blunt trauma.  Hypotension. EXAM: CT CHEST, ABDOMEN, AND PELVIS WITH CONTRAST TECHNIQUE: Multidetector CT imaging of the chest, abdomen and pelvis was performed following the standard protocol during bolus administration of intravenous contrast. RADIATION DOSE REDUCTION: This exam was performed according to the departmental dose-optimization program which includes automated exposure control, adjustment of the mA and/or kV according to patient size and/or use of iterative reconstruction technique. CONTRAST:  75mL OMNIPAQUE  IOHEXOL  350  MG/ML SOLN COMPARISON:  Chest and pelvic radiographs earlier today. Chest CT 12/05/2015 reviewed. Abdominal ultrasound 02/21/2020 FINDINGS: CT CHEST FINDINGS Cardiovascular: No evidence of acute aortic or vascular injury. Aortic atherosclerosis and tortuosity. No aortic aneurysm. Prior CABG with dense calcification of native coronary arteries. The heart is enlarged. No pericardial  effusion. Mediastinum/Nodes: No mediastinal hemorrhage or hematoma. No pneumomediastinum. Small hiatal hernia. No mediastinal or hilar adenopathy. No visible thyroid nodule Lungs/Pleura: No pneumothorax. No evidence of pulmonary contusion. Scarring in the medial right lower lobe adjacent to thoracic osteophytes. No pleural effusion. The trachea and central airways are clear. Musculoskeletal: Prior median sternotomy. No acute sternal fracture. No evidence of acute fracture of the ribs, included clavicles or shoulder girdles. Remote fracture of the right anterior tenth rib. Degenerative change throughout the thoracic spine without acute fracture. There is no confluent chest wall contusion CT ABDOMEN PELVIS FINDINGS Hepatobiliary: No hepatic injury or perihepatic hematoma. Gallbladder is unremarkable. Pancreas: No evidence of injury. No ductal dilatation or inflammation. Spleen: No splenic injury or perisplenic hematoma. Adrenals/Urinary Tract: No adrenal hemorrhage or renal injury identified. Partially exophytic low-density lesion from the lateral right kidney measuring approximately 5.6 cm likely represents a complex cyst, appeared cystic on prior ultrasound. Hounsfield units are 27. No further follow-up imaging is recommended. Nonobstructing stone in the upper left kidney. Bladder is unremarkable. Stomach/Bowel: No evidence of bowel injury or mesenteric hematoma. Small hiatal hernia. There is no bowel wall thickening, inflammation or obstruction. Normal appendix visualized. There is scattered colonic diverticula without diverticulitis. Vascular/Lymphatic: Aortic and branch atherosclerosis. No evidence of vascular injury. No retroperitoneal fluid. Smooth contours of the IVC. No adenopathy. Reproductive: Prostate is unremarkable. Other: No intra-abdominal free air or free fluid. There is no confluent body wall contusion. Musculoskeletal: No acute fracture of the lumbar spine or pelvis. Mild multilevel generative  change in the lumbar spine. 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 IMPRESSION: 1. No evidence of acute traumatic injury to the chest, abdomen, or pelvis. 2. Cardiomegaly with prior CABG. 3. Nonobstructing left renal stone. 4. Colonic diverticulosis without diverticulitis. Aortic Atherosclerosis (ICD10-I70.0). Electronically Signed   By: Andrea Gasman M.D.   On: 04/15/2024 17:35   CT CERVICAL SPINE WO CONTRAST Result Date: 04/15/2024 CLINICAL DATA:  Blunt trauma.  Fall. EXAM: CT CERVICAL SPINE WITHOUT CONTRAST TECHNIQUE: Multidetector CT imaging of the cervical spine was performed without intravenous contrast. Multiplanar CT image reconstructions were also generated. RADIATION DOSE REDUCTION: This exam was performed according to the departmental dose-optimization program which includes automated exposure control, adjustment of the mA and/or kV according to patient size and/or use of iterative reconstruction technique. COMPARISON:  None Available. FINDINGS: Alignment: There is widening of the left C3-C4 facet, but no jumped or perched facets. Trace anterolisthesis of C7 on T1. Skull base and vertebrae: No acute fracture. Vertebral body heights are maintained. The dens and skull base are intact. Degenerative pannus at C1-C2. Soft tissues and spinal canal: No prevertebral fluid or swelling. No visible canal hematoma. Disc levels: Multilevel degenerative disc disease, most prominently affecting C6-C7. Moderate multilevel facet hypertrophy. Upper chest: Assessed on concurrent chest CT, reported separately. Other: None. IMPRESSION: 1. Widening of the left C3-C4 facet, but no jumped or perched facets. This may be degenerative, however if there is clinical concern for ligamentous injury, MRI is recommended. 2. No acute fracture of the cervical spine. 3. Multilevel degenerative disc disease and facet hypertrophy. Electronically Signed   By: Andrea Gasman M.D.   On: 04/15/2024 17:27   CT HEAD  WO CONTRAST Result  Date: 04/15/2024 CLINICAL DATA:  Trauma, fall. EXAM: CT HEAD WITHOUT CONTRAST TECHNIQUE: Contiguous axial images were obtained from the base of the skull through the vertex without intravenous contrast. RADIATION DOSE REDUCTION: This exam was performed according to the departmental dose-optimization program which includes automated exposure control, adjustment of the mA and/or kV according to patient size and/or use of iterative reconstruction technique. COMPARISON:  None Available. FINDINGS: Brain: No intracranial hemorrhage, mass effect, or midline shift. No hydrocephalus. The basilar cisterns are patent. No evidence of territorial infarct or acute ischemia. No extra-axial or intracranial fluid collection. Vascular: No hyperdense vessel or unexpected calcification. Skull: No fracture or focal lesion. Sinuses/Orbits: Assessed on concurrent face CT, reported separately. Minimal opacification of lower left mastoid air cells. Other: Slight frontal scalp edema without confluent hematoma. IMPRESSION: Slight frontal scalp edema. No acute intracranial abnormality. No skull fracture. Electronically Signed   By: Andrea Gasman M.D.   On: 04/15/2024 17:24   DG Pelvis Portable Result Date: 04/15/2024 CLINICAL DATA:  Trauma, fall. EXAM: PORTABLE PELVIS 1-2 VIEWS COMPARISON:  None Available. FINDINGS: The cortical margins of the bony pelvis are intact. No fracture. Pubic symphysis and sacroiliac joints are congruent. Both femoral heads are well-seated in the respective acetabula. Bilateral hip osteoarthritis. IMPRESSION: 1. No pelvic fracture. 2. Bilateral hip osteoarthritis. Electronically Signed   By: Andrea Gasman M.D.   On: 04/15/2024 17:09   DG Chest Port 1 View Result Date: 04/15/2024 CLINICAL DATA:  Trauma, fall. EXAM: PORTABLE CHEST 1 VIEW COMPARISON:  Radiograph 07/03/2023 FINDINGS: Right lateral chest in both lung bases are not entirely included in the field of view. Prior median sternotomy. Stable heart  size and mediastinal contours. No evidence of pneumothorax, large pleural effusion or confluent opacity. No grossly displaced fracture is seen. IMPRESSION: No evidence of acute traumatic injury to the chest. Electronically Signed   By: Andrea Gasman M.D.   On: 04/15/2024 17:08    Anti-infectives: Anti-infectives (From admission, onward)    None      Assessment/Plan 73 y/o M s/p fall from roof after he was hit with a tree branch Shock - transfused 2+2 7/18, hgb 11.7 from 11.6, initially required levophed , but able to be weaned off Widening of the left C3-C4 facet - nontender on exam, having worsening symptoms of central cord this morning with decreased grip strength RUE and neuropathic pain LUE, decreased grip. MRI 7/19 demonstrates no acute fracture or ligamentous injury in the cervical spine, but focal hyperintense T2 weighted signal within the spinal cord at C3-4 levels and at C7-indicate myelomalacia or spinal cord edema; incidentally also noted severe spinal canal stenosis and bilateral neural foraminal stenosis at C6-7 and mild spinal canal stenosis with severe bilateral neuroforaminal stenosis at C3-4, moderate left C2-3, left C4-5, and bilateral C5-6 neural foraminal stenosis. will add gabapentin  and soft c-collar.  Neurosurgery consulted yesterday (Dr. Debby), recommendations pending. FEN - regular diet DVT - SCDs, LMWH Urinary retention- foley placed 7/19 Dispo - Admit to floor, observation, PT/OT  DM - sliding scale    LOS: 0 days   I reviewed nursing notes, ED provider notes, last 24 h vitals and pain scores, last 48 h intake and output, last 24 h labs and trends, and last 24 h imaging results.  This care required moderate level of medical decision making.   Mitzie DELENA Freund MD [facility] Please see Amion for pager number during [place] hours 7:00am-4:30pm

## 2024-04-17 NOTE — Progress Notes (Signed)
 Orthopedic Tech Progress Note Patient Details:  Jerome Vaughn 12/15/1950 [id number]  Ortho Devices Type of Ortho Device: Soft collar Ortho Device/Splint Location: neck Ortho Device/Splint Interventions: Ordered, Application, Adjustment   Post Interventions Patient Tolerated: Fair Instructions Provided: Care of device, Adjustment of device  Gazella Anglin Ronal Brasil 04/17/2024, 8:19 AM

## 2024-04-17 NOTE — Plan of Care (Signed)
   Problem: Education: Goal: Knowledge of General Education information will improve Description: Including pain rating scale, medication(s)/side effects and non-pharmacologic comfort measures Outcome: Progressing   Problem: Nutrition: Goal: Adequate nutrition will be maintained Outcome: Progressing

## 2024-04-17 NOTE — Progress Notes (Signed)
 Physical Therapy Treatment Patient Details Name: Jerome Vaughn MRN: [id number] DOB: 1950/10/05 Today's Date: 04/17/2024   History of Present Illness Jerome Vaughn is a 73 y.o. male who presented [facility] 04/15/24 via EMS as a level 1 trauma for hypotension. He was on his roof cutting a tree branch when it fell on him. Rec'd 2u pRBC and 2FFP. MRI 7/19 demonstrates no acute fracture or ligamentous injury in the cervical spine, but focal hyperintense T2 weighted signal within the spinal cord at C3-4 levels and at C7-indicate myelomalacia or spinal cord edema; incidentally also noted severe spinal canal stenosis and bilateral neural foraminal stenosis at C6-7 and mild spinal canal stenosis with severe bilateral neuroforaminal stenosis at C3-4, moderate left C2-3, left C4-5, and bilateral C5-6 neural foraminal stenosis.  Plan for anterior fusion/decompression C6-7. No PMHx on file.    PT Comments  Pt greeted supine in bed, pleasant and agreeable to PT session. He required increased physical assistance this session, min-modA to complete functional mobility. Pt completed bed>chair transfer by taking short slow steps with intermittent knee buckling. Educated pt on heads-hips relationship and he was able to scoot back in recliner chair with increased time and BUE support on arm rests. Patient will benefit from intensive inpatient follow-up therapy, >3 hours/[place].     If plan is discharge home, recommend the following: A lot of help with walking and/or transfers;A lot of help with bathing/dressing/bathroom;Assistance with cooking/housework;Help with stairs or ramp for entrance;Supervision due to cognitive status   Can travel by private vehicle        Equipment Recommendations  Wheelchair (measurements PT);BSC/3in1    Recommendations for Other Services Rehab consult     Precautions / Restrictions Precautions Precautions: Fall Recall of Precautions/Restrictions: Intact Required Braces or Orthoses: Cervical  Brace Cervical Brace: Soft collar;At all times Restrictions Weight Bearing Restrictions Per Provider Order: No     Mobility  Bed Mobility Overal bed mobility: Needs Assistance Bed Mobility: Supine to Sit     Supine to sit: HOB elevated, Min assist, Mod assist     General bed mobility comments: Pt sat up on R side of bed with increased time. He brought BLE off EOB. Assist to elevate trunk and scoot hips forward using bed pad.    Transfers Overall transfer level: Needs assistance Equipment used: 1 person hand held assist Transfers: Sit to/from Stand, Bed to chair/wheelchair/BSC Sit to Stand: Min assist   Step pivot transfers: Min assist, Mod assist       General transfer comment: Pt stood from lowest bed height. He powered up with minA and HHA provided to RUE. Pt transferred to recliner chair on his left. Good eccentric control.    Ambulation/Gait Ambulation/Gait assistance: Min assist, Mod assist Gait Distance (Feet): 2 Feet Assistive device: 1 person hand held assist Gait Pattern/deviations: Step-to pattern, Decreased step length - right, Decreased step length - left, Decreased stride length, Knees buckling       General Gait Details: Pt took a couple of steps while he was pivoting to recliner chair on his left. Intermittent knee buckling observered requiring modA to stabilize and correct. Cues for sequencing.   Stairs             Wheelchair Mobility     Tilt Bed    Modified Rankin (Stroke Patients Only)       Balance Overall balance assessment: Needs assistance Sitting-balance support: No upper extremity supported, Feet supported Sitting balance-Leahy Scale: Fair     Standing balance support: Single  extremity supported, During functional activity Standing balance-Leahy Scale: Poor Standing balance comment: Pt dependent on external support and knees buckled requiring min-modA to correct.                            Communication  Communication Communication: No apparent difficulties  Cognition Arousal: Alert Behavior During Therapy: WFL for tasks assessed/performed   PT - Cognitive impairments: No apparent impairments                         Following commands: Intact      Cueing Cueing Techniques: Verbal cues, Tactile cues  Exercises      General Comments        Pertinent Vitals/Pain Pain Assessment Pain Assessment: Faces Faces Pain Scale: Hurts little more Pain Location: Shoulders, UEs, neck Pain Descriptors / Indicators: Discomfort, Aching, Tender, Sore Pain Intervention(s): Monitored during session, Limited activity within patient's tolerance, Repositioned    Home Living                          Prior Function            PT Goals (current goals can now be found in the care plan section) Acute Rehab PT Goals Patient Stated Goal: Get the surgery and begin the road to recovery Progress towards PT goals: Progressing toward goals    Frequency    Min 3X/week      PT Plan      Co-evaluation              AM-PAC PT 6 Clicks Mobility   Outcome Measure  Help needed turning from your back to your side while in a flat bed without using bedrails?: A Little Help needed moving from lying on your back to sitting on the side of a flat bed without using bedrails?: A Lot Help needed moving to and from a bed to a chair (including a wheelchair)?: A Lot Help needed standing up from a chair using your arms (e.g., wheelchair or bedside chair)?: A Lot Help needed to walk in hospital room?: A Lot Help needed climbing 3-5 steps with a railing? : A Lot 6 Click Score: 13    End of Session Equipment Utilized During Treatment: Gait belt Activity Tolerance: Patient tolerated treatment well;Patient limited by fatigue Patient left: in chair;with call bell/phone within reach;with chair alarm set;with family/visitor present Nurse Communication: Mobility status PT Visit Diagnosis:  Difficulty in walking, not elsewhere classified (R26.2);Unsteadiness on feet (R26.81);Other abnormalities of gait and mobility (R26.89)     Time: 8591-8573 PT Time Calculation (min) (ACUTE ONLY): 18 min  Charges:    $Therapeutic Activity: 8-22 mins PT General Charges $$ ACUTE PT VISIT: 1 Visit                     Randall SAUNDERS, PT, DPT Acute Rehabilitation Services Office: [phone] Secure Chat Preferred  Jerome Vaughn 04/17/2024, 2:51 PM

## 2024-04-18 DIAGNOSIS — Z79899 Other long term (current) drug therapy: Secondary | ICD-10-CM | POA: Diagnosis not present

## 2024-04-18 DIAGNOSIS — K592 Neurogenic bowel, not elsewhere classified: Secondary | ICD-10-CM | POA: Diagnosis present

## 2024-04-18 DIAGNOSIS — R739 Hyperglycemia, unspecified: Secondary | ICD-10-CM | POA: Diagnosis not present

## 2024-04-18 DIAGNOSIS — R7989 Other specified abnormal findings of blood chemistry: Secondary | ICD-10-CM | POA: Diagnosis not present

## 2024-04-18 DIAGNOSIS — S14129A Central cord syndrome at unspecified level of cervical spinal cord, initial encounter: Secondary | ICD-10-CM | POA: Diagnosis present

## 2024-04-18 DIAGNOSIS — E785 Hyperlipidemia, unspecified: Secondary | ICD-10-CM | POA: Diagnosis present

## 2024-04-18 DIAGNOSIS — I959 Hypotension, unspecified: Secondary | ICD-10-CM | POA: Diagnosis not present

## 2024-04-18 DIAGNOSIS — G8250 Quadriplegia, unspecified: Secondary | ICD-10-CM | POA: Diagnosis not present

## 2024-04-18 DIAGNOSIS — G904 Autonomic dysreflexia: Secondary | ICD-10-CM | POA: Diagnosis not present

## 2024-04-18 DIAGNOSIS — N390 Urinary tract infection, site not specified: Secondary | ICD-10-CM | POA: Diagnosis not present

## 2024-04-18 DIAGNOSIS — G8254 Quadriplegia, C5-C7 incomplete: Secondary | ICD-10-CM | POA: Diagnosis not present

## 2024-04-18 DIAGNOSIS — Z4789 Encounter for other orthopedic aftercare: Secondary | ICD-10-CM | POA: Diagnosis not present

## 2024-04-18 DIAGNOSIS — Z951 Presence of aortocoronary bypass graft: Secondary | ICD-10-CM | POA: Diagnosis not present

## 2024-04-18 DIAGNOSIS — D649 Anemia, unspecified: Secondary | ICD-10-CM | POA: Diagnosis present

## 2024-04-18 DIAGNOSIS — D696 Thrombocytopenia, unspecified: Secondary | ICD-10-CM | POA: Diagnosis present

## 2024-04-18 DIAGNOSIS — W208XXA Other cause of strike by thrown, projected or falling object, initial encounter: Secondary | ICD-10-CM | POA: Diagnosis present

## 2024-04-18 DIAGNOSIS — I2510 Atherosclerotic heart disease of native coronary artery without angina pectoris: Secondary | ICD-10-CM | POA: Diagnosis present

## 2024-04-18 DIAGNOSIS — N319 Neuromuscular dysfunction of bladder, unspecified: Secondary | ICD-10-CM | POA: Diagnosis present

## 2024-04-18 DIAGNOSIS — K5900 Constipation, unspecified: Secondary | ICD-10-CM | POA: Diagnosis not present

## 2024-04-18 DIAGNOSIS — M4802 Spinal stenosis, cervical region: Secondary | ICD-10-CM | POA: Diagnosis present

## 2024-04-18 DIAGNOSIS — S14157S Other incomplete lesion at C7 level of cervical spinal cord, sequela: Secondary | ICD-10-CM | POA: Diagnosis not present

## 2024-04-18 DIAGNOSIS — I10 Essential (primary) hypertension: Secondary | ICD-10-CM | POA: Diagnosis present

## 2024-04-18 DIAGNOSIS — S14123A Central cord syndrome at C3 level of cervical spinal cord, initial encounter: Secondary | ICD-10-CM | POA: Diagnosis not present

## 2024-04-18 DIAGNOSIS — R579 Shock, unspecified: Secondary | ICD-10-CM | POA: Diagnosis present

## 2024-04-18 DIAGNOSIS — K219 Gastro-esophageal reflux disease without esophagitis: Secondary | ICD-10-CM | POA: Diagnosis present

## 2024-04-18 DIAGNOSIS — M160 Bilateral primary osteoarthritis of hip: Secondary | ICD-10-CM | POA: Diagnosis present

## 2024-04-18 DIAGNOSIS — Z794 Long term (current) use of insulin: Secondary | ICD-10-CM | POA: Diagnosis not present

## 2024-04-18 DIAGNOSIS — R339 Retention of urine, unspecified: Secondary | ICD-10-CM | POA: Diagnosis present

## 2024-04-18 DIAGNOSIS — R578 Other shock: Secondary | ICD-10-CM | POA: Diagnosis present

## 2024-04-18 DIAGNOSIS — M4804 Spinal stenosis, thoracic region: Secondary | ICD-10-CM | POA: Diagnosis present

## 2024-04-18 DIAGNOSIS — E119 Type 2 diabetes mellitus without complications: Secondary | ICD-10-CM | POA: Diagnosis present

## 2024-04-18 DIAGNOSIS — R059 Cough, unspecified: Secondary | ICD-10-CM | POA: Diagnosis not present

## 2024-04-18 DIAGNOSIS — R0789 Other chest pain: Secondary | ICD-10-CM | POA: Diagnosis not present

## 2024-04-18 DIAGNOSIS — S14123D Central cord syndrome at C3 level of cervical spinal cord, subsequent encounter: Secondary | ICD-10-CM | POA: Diagnosis not present

## 2024-04-18 DIAGNOSIS — Z7985 Long-term (current) use of injectable non-insulin antidiabetic drugs: Secondary | ICD-10-CM | POA: Diagnosis not present

## 2024-04-18 DIAGNOSIS — I951 Orthostatic hypotension: Secondary | ICD-10-CM | POA: Diagnosis not present

## 2024-04-18 DIAGNOSIS — K5901 Slow transit constipation: Secondary | ICD-10-CM | POA: Diagnosis not present

## 2024-04-18 DIAGNOSIS — F54 Psychological and behavioral factors associated with disorders or diseases classified elsewhere: Secondary | ICD-10-CM | POA: Diagnosis not present

## 2024-04-18 DIAGNOSIS — Z7984 Long term (current) use of oral hypoglycemic drugs: Secondary | ICD-10-CM | POA: Diagnosis not present

## 2024-04-18 DIAGNOSIS — W132XXA Fall from, out of or through roof, initial encounter: Secondary | ICD-10-CM | POA: Diagnosis present

## 2024-04-18 LAB — CBC
HCT: 36.9 % — ABNORMAL LOW (ref 39.0–52.0)
Hemoglobin: 12.4 g/dL — ABNORMAL LOW (ref 13.0–17.0)
MCH: 29.5 pg (ref 26.0–34.0)
MCHC: 33.6 g/dL (ref 30.0–36.0)
MCV: 87.6 fL (ref 80.0–100.0)
Platelets: 96 K/uL — ABNORMAL LOW (ref 150–400)
RBC: 4.21 MIL/uL — ABNORMAL LOW (ref 4.22–5.81)
RDW: 14 % (ref 11.5–15.5)
WBC: 3.6 K/uL — ABNORMAL LOW (ref 4.0–10.5)
nRBC: 0 % (ref 0.0–0.2)

## 2024-04-18 LAB — BASIC METABOLIC PANEL WITH GFR
Anion gap: 10 (ref 5–15)
BUN: 10 mg/dL (ref 8–23)
CO2: 25 mmol/L (ref 22–32)
Calcium: 9 mg/dL (ref 8.9–10.3)
Chloride: 103 mmol/L (ref 98–111)
Creatinine, Ser: 0.98 mg/dL (ref 0.61–1.24)
GFR, Estimated: >60 mL/min (ref >60)
Glucose, Bld: 129 mg/dL — ABNORMAL HIGH (ref 70–99)
Potassium: 3.8 mmol/L (ref 3.5–5.1)
Sodium: 138 mmol/L (ref 135–145)

## 2024-04-18 LAB — GLUCOSE, CAPILLARY
Glucose-Capillary: 186 mg/dL — ABNORMAL HIGH (ref 70–99)
Glucose-Capillary: 125 mg/dL — ABNORMAL HIGH (ref 70–99)
Glucose-Capillary: 251 mg/dL — ABNORMAL HIGH (ref 70–99)
Glucose-Capillary: 154 mg/dL — ABNORMAL HIGH (ref 70–99)
Glucose-Capillary: 150 mg/dL — ABNORMAL HIGH (ref 70–99)

## 2024-04-18 LAB — MAGNESIUM: Magnesium: 1.8 mg/dL (ref 1.7–2.4)

## 2024-04-18 MED ORDER — POLYETHYLENE GLYCOL 3350 17 G PO PACK
17.0000 g | PACK | Freq: Every day | ORAL | Status: DC
  Administered 2024-04-18 – 2024-04-21 (×3): 17 g via ORAL
  Filled 2024-04-18 (×3): qty 1

## 2024-04-18 NOTE — Progress Notes (Signed)
 Orthopedic Tech Progress Note Patient Details:  Jerome Vaughn [birth year] [id number]  Ortho Devices Type of Ortho Device: Velcro wrist splint Ortho Device/Splint Location: BUE Ortho Device/Splint Interventions: Ordered, Application, Adjustment   Post Interventions Patient Tolerated: Well Instructions Provided: Care of device  Delanna LITTIE Pac 04/18/2024, 3:52 PM

## 2024-04-18 NOTE — Progress Notes (Signed)
 Inpatient Rehab Coordinator Note:  I spoke with patient and his spouse over the phone to discuss CIR recommendations and goals/expectations of CIR stay.  We reviewed 3 hrs/[place] of therapy, physician follow up, and average length of stay 2 weeks (dependent upon progress) with goals tbd.  Note plans for ACDF tomorrow and will see how he does post op.  Will also need PMR assessment which I will ask for tomorrow, pending surgery timing, and prior auth from [facility].  We will continue to follow.   Reche Lowers, PT, DPT Admissions Coordinator [phone] 04/18/24  12:46 PM

## 2024-04-18 NOTE — Progress Notes (Signed)
 Subjective: Still weak in upper and some in lower extremities.  No BM.  Takes Ozempic and last took it on Friday.  Otherwise eating well.  No other complaints  ROS: See above, otherwise other systems negative  Objective: Vital signs in last 24 hours: Temp:  [98.2 F (36.8 C)-98.6 F (37 C)] 98.2 F (36.8 C) (07/21 0723) Pulse Rate:  [61-67] 61 (07/21 0723) Resp:  [15-17] 15 (07/21 0723) BP: (137-165)/(79-87) 165/84 (07/21 0723) SpO2:  [95 %-98 %] 96 % (07/21 0723) Last BM Date : 04/14/24  Intake/Output from previous [facility]: 07/20 0701 - 07/21 0700 In: -  Out: 2450 [Urine:2450] Intake/Output this shift: No intake/output data recorded.  PE: Gen: NAD HEENT: neck with soft collar in place Heart: regular Lungs: CTAB Abd: soft, NT GU: foley in place with yellow urine Ext: symmetrical, + pedal pulses Neuro: R>L upper extremity weakness, both sides are weak with grip, biceps, triceps.  Good shoulder shrug.  Slightly weak BLE per patient when ambulating, but good dorsiflexion, plantarflexion, quad and hamstring strength with tests laying in bed Psych: A&Ox3  Lab Results:  Recent Labs    04/16/24 0431 04/18/24 0651  WBC 4.6 3.6*  HGB 11.7* 12.4*  HCT 34.5* 36.9*  PLT 100* 96*   BMET Recent Labs    04/15/24 1647 04/15/24 1729 04/16/24 0431  NA 137 138 134*  K 4.2 4.3 4.0  CL 107 106 106  CO2 20*  --  21*  GLUCOSE 177* 174* 166*  BUN 19 19 18   CREATININE 1.43* 1.40* 1.23  CALCIUM  8.4*  --  8.9   PT/INR Recent Labs    04/15/24 1647  LABPROT 15.2  INR 1.1   CMP     Component Value Date/Time   NA 134 (L) 04/16/2024 0431   K 4.0 04/16/2024 0431   CL 106 04/16/2024 0431   CO2 21 (L) 04/16/2024 0431   GLUCOSE 166 (H) 04/16/2024 0431   BUN 18 04/16/2024 0431   CREATININE 1.23 04/16/2024 0431   CALCIUM  8.9 04/16/2024 0431   PROT 5.8 (L) 04/15/2024 1647   ALBUMIN 3.0 (L) 04/15/2024 1647   AST 28 04/15/2024 1647   ALT 27 04/15/2024 1647   ALKPHOS 24  (L) 04/15/2024 1647   BILITOT 0.7 04/15/2024 1647   GFRNONAA >60 04/16/2024 0431   Lipase  No results found for: LIPASE     Studies/Results: MR CERVICAL SPINE W WO CONTRAST Result Date: 04/16/2024 CLINICAL DATA:  Trauma EXAM: MRI CERVICAL SPINE WITHOUT AND WITH CONTRAST TECHNIQUE: Multiplanar and multiecho pulse sequences of the cervical spine, to include the craniocervical junction and cervicothoracic junction, were obtained without and with intravenous contrast. CONTRAST:  8mL GADAVIST  GADOBUTROL  1 MMOL/ML IV SOLN COMPARISON:  Cervical spine CT 04/15/2024 with FINDINGS: Alignment: Physiologic. Vertebrae: No fracture, evidence of discitis, or bone lesion. No ligamentous interruption Cord: Focal hyperintense T2-weighted signal within the spinal cord at the C3-4 levels and at C7. Posterior Fossa, vertebral arteries, paraspinal tissues: Left mastoid fluid Disc levels: C1-2: Unremarkable. C2-3: Moderate left facet hypertrophy with left-greater-than-right uncovertebral spurring. There is no spinal canal stenosis. Moderate left neural foraminal stenosis. C3-4: Intermediate sized disc bulge with bilateral uncovertebral hypertrophy. Mild spinal canal stenosis. Severe bilateral neural foraminal stenosis. C4-5: Left-greater-than-right uncovertebral spurring and mild facet hypertrophy. There is no spinal canal stenosis. Moderate left neural foraminal stenosis. C5-6: Intermediate sized disc bulge. Mild spinal canal stenosis. Moderate bilateral neural foraminal stenosis. C6-7: Intermediate sized disc osteophyte complex. Severe spinal  canal stenosis. Severe bilateral neural foraminal stenosis. C7-T1: Normal disc space and facet joints. There is no spinal canal stenosis. No neural foraminal stenosis. IMPRESSION: 1. No acute fracture or ligamentous injury of the cervical spine. 2. 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 3. Severe spinal canal stenosis and bilateral neural foraminal stenosis at C6-7. 4. Mild spinal canal stenosis and severe bilateral neural foraminal stenosis at C3-4. 5. Moderate left C2-3, left C4-5 and bilateral C5-6 neural foraminal stenosis. Electronically Signed   By: Franky Stanford M.D.   On: 04/16/2024 22:56    Anti-infectives: Anti-infectives (From admission, onward)    None        Assessment/Plan Fall from roof after he was hit with a tree branch Shock - transfused 2+2 7/18, hgb 11.7 from 11.6, initially required levophed , but able to be weaned off.  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 Debby.  Continue therapies.  Soft collar in place. DM - on Ozempic, last dose last Friday.  SSI CAD/HTN - reordered Coreg , Benazepril on hold HLD - Zetia  reordered Urinary retention - foley in place, BMET pending Thrombocytopenia - plts 96K, monitor FEN - regular, NPO p MN VTE - lovenox  ID - none currently warranted  I reviewed nursing notes, Consultant NSGY notes, last 24 h vitals and pain scores, last 48 h intake and output, last 24 h labs and trends, and last 24 h imaging results.   LOS: 0 days    Burnard FORBES Banter , [facility] 04/18/2024, 8:22 AM Please see Amion for pager number during Leisure hours 7:00am-4:30pm or 7:00am -11:30am on weekends

## 2024-04-18 NOTE — Progress Notes (Signed)
 Occupational Therapy Treatment Patient Details Name: Jerome Vaughn MRN: [id number] DOB: [birth year] Today's Date: 04/18/2024   History of present illness Jerome Vaughn is a 73 y.o. male who presented to [facility] 04/15/24 via EMS as a level 1 trauma for hypotension. He was on his roof cutting a tree branch when it fell on him. Rec'd 2u pRBC and 2FFP. MRI 7/19 demonstrates no acute fracture or ligamentous injury in the cervical spine, but focal hyperintense T2 weighted signal within the spinal cord at C3-4 levels and at C7-indicate myelomalacia or spinal cord edema; incidentally also noted severe spinal canal stenosis and bilateral neural foraminal stenosis at C6-7 and mild spinal canal stenosis with severe bilateral neuroforaminal stenosis at C3-4, moderate left C2-3, left C4-5, and bilateral C5-6 neural foraminal stenosis.  Plan for anterior fusion/decompression C6-7. No PMHx on file.   OT comments  Pt in bed upon therapy arrival with wife present in room. Pt complaining of increased back and neck pain since returning to bed. Assisted pt with positioning techniques utilizing pillows and changing degree of HOB. Wrist cock up splint have been delivered since first session. Assess fit and made adjustments to strap tightness to increase support to wrist. Pt verbalized improved comfort post adjustments. Soft touch call light placed near pt for ability to access. Pt provided with pink foam resistive block for use in left hand to increase hand strength. Pt able to demonstrate understanding of use. Patient will benefit from intensive inpatient follow-up therapy, >3 hours/Lull.       If plan is discharge home, recommend the following:  A lot of help with walking and/or transfers;A lot of help with bathing/dressing/bathroom;Assistance with feeding;Help with stairs or ramp for entrance;Assist for transportation   Equipment Recommendations  BSC/3in1;Wheelchair (measurements OT);Wheelchair cushion (measurements OT);Hospital bed     Recommendations for Other Services Rehab consult    Precautions / Restrictions Precautions Precautions: Fall Recall of Precautions/Restrictions: Intact Required Braces or Orthoses: Cervical Brace Cervical Brace: Soft collar;At all times Restrictions Weight Bearing Restrictions Per Provider Order: No                          Communication Communication Communication: No apparent difficulties   Cognition Arousal: Alert Behavior During Therapy: WFL for tasks assessed/performed Cognition: No apparent impairments     Following commands: Intact        Cueing   Cueing Techniques: Verbal cues        General Comments Splint check completed for bilateral wrist cock up splints. Adjustment made to straps to provide better support and stability. Pt provided with pink foam resistive block and able to demonstrate use in left hand. Pillows placed under BUE while in bed to increase support needed. HOB slightly elevated.    Pertinent Vitals/ Pain       Pain Assessment Pain Assessment: Faces Faces Pain Scale: Hurts even more Pain Location: back and neck Pain Descriptors / Indicators: Aching, Discomfort, Grimacing Pain Intervention(s): Repositioned         Frequency  Min 2X/week        Progress Toward Goals  OT Goals(current goals can now be found in the care plan section)  Progress towards OT goals: Progressing toward goals      AM-PAC OT 6 Clicks Daily Activity     Outcome Measure   Help from another person eating meals?: A Lot Help from another person taking care of personal grooming?: Total Help from another person toileting, which includes using toliet,  bedpan, or urinal?: Total Help from another person bathing (including washing, rinsing, drying)?: Total Help from another person to put on and taking off regular upper body clothing?: Total Help from another person to put on and taking off regular lower body clothing?: Total 6 Click Score: 7    End  of Session    OT Visit Diagnosis: Unsteadiness on feet (R26.81);Other abnormalities of gait and mobility (R26.89);Muscle weakness (generalized) (M62.81);Pain;Other symptoms and signs involving the nervous system (R29.898) Pain - part of body:  (neck/back)   Activity Tolerance Patient tolerated treatment well   Patient Left in bed;with call bell/phone within reach;with bed alarm set;with family/visitor present           Time: 8392-8380 OT Time Calculation (min): 12 min  Charges: OT General Charges $OT Visit: 1 Visit OT Treatments $Self Care/Home Management : 23-37 mins $Therapeutic Activity: 8-22 mins  Leita Howell, OTR/L,CBIS  Supplemental OT - [facility] and [facility] Secure Chat Preferred    Breena Bevacqua, Leita BIRCH 04/18/2024, 5:05 PM

## 2024-04-18 NOTE — PMR Pre-admission (Signed)
 PMR Admission Coordinator Pre-Admission Assessment  Patient: Jerome Vaughn is an 73 y.o., male MRN: [id number] DOB: 1950-10-31 Height: 5' 10 (177.8 cm) Weight: 76.2 kg              Insurance Information HMO: yes    PPO:      PCP:      IPA:      80/20:      OTHER: [employer] HMO      Policy#: [id number]      Subscriber: pt CM Name: Luke HERO      Phone#: [phone] ext [phone]     Fax#: [phone] Pre-Cert#: [id number] auth for CIR provided by Luke with [facility] Medicare with updates due to Norvel SQUIBB (ext [phone]) at fax listed above on 7/30      Employer:  Benefits:  Phone #: [phone]     Name:  Eff. Date: 09/30/23     Deduct: $300 ($0 met)      Out of Pocket Max: $9350 (met $172.48)      Life Max: n/a  CIR: $399/Heft for days 1-6 (max $2394)      SNF: 20 full days Outpatient:      Co-Pay: $25/visit Home Health: 100%      Co-Pay:  DME: 88%     Co-Pay: 12% Providers:  SECONDARY:       Policy#:       Phone#:   Artist:       Phone#:   The Engineer, materials Information Summary" for patients in Inpatient Rehabilitation Facilities with attached "Privacy Act Statement-Health Care Records" was provided and verbally reviewed with: Patient and Family  Emergency Contact Information Contact Information     Name Relation Home Work Mobile   [facility] Spouse   [phone]   Flom,[name] Daughter   [phone]   Zaim, Nitta   [phone]      Other Contacts   None on File    Current Medical History  Patient Admitting Diagnosis: central cord syndrome   History of Present Illness: Jerome Vaughn is a 73 year old right-handed male with PMH of diabetes mellitus, CAD with CABG 2017,  hypertension, hyperlipidemia who presented to [facility] on 04/15/2024 after being on a roof cutting a tree branch when the tree fell on him pinning him against the roof.  No loss of conscious.  He was found to be in shock and transfused 2+2, started on Levophed  which was quickly weaned off.   Cranial CT scan showed slight frontal scalp edema.  No acute intracranial abnormality.  No skull fracture.  CT cervical spine widening of the left C3-4 facet, but no jumped or perched facets.  No acute fracture of the cervical spine.  MRI cervical spine showed focal hyperintense T2 weighted signal within the spinal cord at C3-4 levels and at C7 indicating possible myelomalacia or spinal cord edema.  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  Mild spinal canal stenosis and severe bilateral neural foraminal stenosis C3-4 CT of the chest abdomen pelvis unremarkable.  CT maxillofacial showed soft tissue edema in the right cheek no acute facial bone fracture.  Admission chemistries unremarkable aside glucose 177 creatinine 1.43 alkaline phosphatase 24, WBC 2.6, hemoglobin A1c 6.4, alcohol negative.  Underwent arthrodesis C6-7 anterior interbody technique including discectomy for decompression of spinal cord and exiting nerve roots with foraminotomies.  Placement of intervertebral biomechanical device C6-7 04/19/2024 per Dr. Dorn Ned.  Placed in a soft cervical collar at all times.  Tolerating a regular consistency  diet.  Therapy evaluations completed and pt was recommended for a comprehensive rehab program.    Glasgow Coma Scale Score: 15  Patient's medical record from [facility] has been reviewed by the rehabilitation admission coordinator and physician.  Past Medical History  Past Medical History:  Diagnosis Date   Diabetes mellitus (HCC) 2009   Ganglion cyst of wrist, right 1989   Hypertension     Has the patient had major surgery during 100 days prior to admission? Yes  Family History  family history is not on file.   Current Medications   Current Facility-Administered Medications:    0.9 %  sodium chloride  infusion, 250 mL, Intravenous, Continuous, Johnanna Credit Caylin, PA-C, Last Rate: 1 mL/hr at 04/19/24 1829, 250 mL at 04/19/24 1829   acetaminophen   (TYLENOL ) tablet 1,000 mg, 1,000 mg, Oral, Q6H, Lovick, Dreama SAILOR, MD, 1,000 mg at 04/20/24 1205   carvedilol  (COREG ) tablet 3.125 mg, 3.125 mg, Oral, BID WC, Simaan, Elizabeth S, PA-C, 3.125 mg at 04/20/24 9182   Chlorhexidine  Gluconate Cloth 2 % PADS 6 each, 6 each, Topical, Daily, Simaan, Elizabeth S, PA-C, 6 each at 04/20/24 1205   docusate sodium  (COLACE) capsule 100 mg, 100 mg, Oral, BID, Paola Dreama SAILOR, MD, 100 mg at 04/20/24 9183   ezetimibe  (ZETIA ) tablet 10 mg, 10 mg, Oral, Daily, Simaan, Elizabeth S, PA-C, 10 mg at 04/20/24 9182   gabapentin  (NEURONTIN ) capsule 300 mg, 300 mg, Oral, TID, Signe Cree A, MD, 300 mg at 04/20/24 9182   hydrALAZINE  (APRESOLINE ) injection 10 mg, 10 mg, Intravenous, Q2H PRN, Paola Dreama SAILOR, MD   HYDROmorphone  (DILAUDID ) injection 0.5 mg, 0.5 mg, Intravenous, Q2H PRN, Signe Cree LABOR, MD   insulin  aspart (novoLOG ) injection 0-15 Units, 0-15 Units, Subcutaneous, TID WC, Signe Cree LABOR, MD, 3 Units at 04/20/24 1207   insulin  aspart (novoLOG ) injection 0-5 Units, 0-5 Units, Subcutaneous, QHS, Signe Cree LABOR, MD, 2 Units at 04/19/24 2132   menthol -cetylpyridinium (CEPACOL) lozenge 3 mg, 1 lozenge, Oral, PRN **OR** phenol (CHLORASEPTIC) mouth spray 1 spray, 1 spray, Mouth/Throat, PRN, Johnanna Credit Caylin, PA-C   methocarbamol  (ROBAXIN ) tablet 500 mg, 500 mg, Oral, TID, Tammy Sor, PA-C, 500 mg at 04/20/24 1204   metoprolol  tartrate (LOPRESSOR ) injection 5 mg, 5 mg, Intravenous, Q6H PRN, Lovick, Dreama SAILOR, MD   ondansetron  (ZOFRAN -ODT) disintegrating tablet 4 mg, 4 mg, Oral, Q6H PRN **OR** ondansetron  (ZOFRAN ) injection 4 mg, 4 mg, Intravenous, Q6H PRN, Paola Dreama SAILOR, MD   Oral care mouth rinse, 15 mL, Mouth Rinse, PRN, Tammy Sor, PA-C   oxyCODONE  (Oxy IR/ROXICODONE ) immediate release tablet 5-10 mg, 5-10 mg, Oral, Q4H PRN, Paola Dreama SAILOR, MD, 10 mg at 04/17/24 0857   pantoprazole  (PROTONIX ) EC tablet 40 mg, 40 mg, Oral, BID, Simaan,  Elizabeth S, PA-C, 40 mg at 04/20/24 0818   polyethylene glycol (MIRALAX  / GLYCOLAX ) packet 17 g, 17 g, Oral, Daily, Tammy Sor, PA-C, 17 g at 04/20/24 0815   ranolazine  (RANEXA ) 12 hr tablet 500 mg, 500 mg, Oral, BID, Simaan, Elizabeth S, PA-C, 500 mg at 04/20/24 9182   sodium chloride  flush (NS) 0.9 % injection 3 mL, 3 mL, Intravenous, Q12H, Tomlinson, Sara Caylin, PA-C   sodium chloride  flush (NS) 0.9 % injection 3 mL, 3 mL, Intravenous, PRN, Tomlinson, Sara Caylin, PA-C  Patients Current Diet:  Diet Order             Diet Carb Modified Fluid consistency: Thin; Room service appropriate? Yes  Diet effective now  Precautions / Restrictions Precautions Precautions: Fall Cervical Brace: Soft collar, At all times Restrictions Weight Bearing Restrictions Per Provider Order: No   Has the patient had 2 or more falls or a fall with injury in the past year?Yes  Prior Activity Level Community (5-7x/wk): independent prior to admit, working, driving  Prior Functional Level Prior Function Prior Level of Function : Independent/Modified Independent, Driving, Working/employed Mobility Comments: Ambulates without AD. Pt reports occasionally wearing Rt knee brace. 1 fall leading to current admission. ADLs Comments: Indep with ADLs/IADLs. Pt reports some difficulty with buttoning shirts and needs assist with dressing occasionally. Work full-time as a Lobbyist. Completes yard work. Wife would help with buttons and shoes at times, R hand numbness.  Self Care: Did the patient need help bathing, dressing, using the toilet or eating?  Independent  Indoor Mobility: Did the patient need assistance with walking from room to room (with or without device)? Independent  Stairs: Did the patient need assistance with internal or external stairs (with or without device)? Independent  Functional Cognition: Did the patient need help planning regular tasks such as shopping or  remembering to take medications? Independent  Patient Information Are you of Hispanic, Latino/a,or Spanish origin?: A. No, not of Hispanic, Latino/a, or Spanish origin What is your race?: B. Black or African American Do you need or want an interpreter to communicate with a doctor or health care staff?: 0. No  Patient's Response To:  Health Literacy and Transportation Is the patient able to respond to health literacy and transportation needs?: Yes Health Literacy - How often do you need to have someone help you when you read instructions, pamphlets, or other written material from your doctor or pharmacy?: Never In the past 12 months, has lack of transportation kept you from medical appointments or from getting medications?: No In the past 12 months, has lack of transportation kept you from meetings, work, or from getting things needed for daily living?: No  Home Assistive Devices / Equipment Home Equipment: [place] - single point, Shower seat - built in  Prior Device Use: Indicate devices/aids used by the patient prior to current illness, exacerbation or injury? None of the above  Current Functional Level Cognition  Overall Cognitive Status: Within Functional Limits for tasks assessed Orientation Level: Oriented X4    Extremity Assessment (includes Sensation/Coordination)  Upper Extremity Assessment: Defer to OT evaluation RUE Deficits / Details: Able to partially extend fingers. Very weak gross grasp noted. No active wrist movement demonstrated. Able to demonstrate elbow flexion/extension actively [facility] although increased effort needed with 3/5 strength. Shoulder flexion/abduction 3-/5 demonstrating ~25-30% A/ROM. RUE: Shoulder pain with ROM RUE Sensation: history of peripheral neuropathy RUE Coordination: decreased fine motor, decreased gross motor LUE Deficits / Details: demonstrating more hand function in left hand versus right. Able to open and close fingers into loose gross grasp. No  active wrist movement demonstrated. Able to demonstrate elbow flexion/extension WFL at 3/5 MMT. shoulder flexion/abduction 3-/5 while demonstrating ~40-50% A/ROM. LUE: Shoulder pain with ROM LUE Sensation: history of peripheral neuropathy LUE Coordination: decreased fine motor, decreased gross motor  Lower Extremity Assessment: Overall WFL for tasks assessed    ADLs  Overall ADL's : Needs assistance/impaired Eating/Feeding: Maximal assistance, Sitting, Bed level Grooming: Maximal assistance, Sitting, Bed level Upper Body Bathing: Maximal assistance, Sitting, Bed level Lower Body Bathing: Maximal assistance, Sitting/lateral leans Upper Body Dressing : Maximal assistance, Sitting, Bed level Lower Body Dressing: Maximal assistance, Sitting/lateral leans, Sit to/from stand Toilet Transfer: Minimal assistance,  Moderate assistance, Rolling walker (2 wheels), BSC/3in1 Toileting- Clothing Manipulation and Hygiene: Maximal assistance, Sitting/lateral lean, Sit to/from stand General ADL Comments: Pt max for ADLs as he does not have functional grip or FM skills. Pt min/mod A x2 assist for transfer to [facility], not able to hold RW safely    Mobility  Overal bed mobility: Needs Assistance Bed Mobility: Supine to Sit Supine to sit: HOB elevated, Min assist, Mod assist Sit to supine: Min assist General bed mobility comments: Not assessed. Pt greeted seated in recliner chair and returned there at end of session.    Transfers  Overall transfer level: Needs assistance Equipment used: 1 person hand held assist Transfers: Sit to/from Stand, Bed to chair/wheelchair/BSC Sit to Stand: Min assist Bed to/from chair/wheelchair/BSC transfer type:: Step pivot Step pivot transfers: Min assist, Mod assist General transfer comment: Pt stood from lowest bed height. He powered up with minA and HHA provided to RUE. Pt transferred to recliner chair on his left. Good eccentric control.    Ambulation / Gait / Stairs /  Wheelchair Mobility  Ambulation/Gait Ambulation/Gait assistance: Min assist, Mod assist Gait Distance (Feet): 2 Feet Assistive device: 1 person hand held assist Gait Pattern/deviations: Step-to pattern, Decreased step length - right, Decreased step length - left, Decreased stride length, Knees buckling General Gait Details: Pt took a couple of steps while he was pivoting to recliner chair on his left. Intermittent knee buckling observered requiring modA to stabilize and correct. Cues for sequencing.    Posture / Balance Dynamic Sitting Balance Sitting balance - Comments: Pt sat EOB with supervision. Balance Overall balance assessment: Needs assistance Sitting-balance support: No upper extremity supported, Feet supported Sitting balance-Leahy Scale: Fair Sitting balance - Comments: Pt sat EOB with supervision. Standing balance support: Single extremity supported, During functional activity Standing balance-Leahy Scale: Poor Standing balance comment: Pt dependent on external support and knees buckled requiring min-modA to correct.    Special needs/care consideration Skin surgical incision to neck, soft collar at all times and Diabetic management yes     Previous Home Environment (from acute therapy documentation) Living Arrangements: Spouse/significant other, Children  Lives With: Spouse, Family Available Help at Discharge: Family, Available 24 hours/S[place]k, Available PRN/intermittently (Wife can provide 24/7 supervision, but is unable to physically assist. Granddaughter works as Lawyer at an ALF and is there at times.) Type of Home: House Home Layout: One level Home Access: Stairs to enter Entrance Stairs-Rails: None Secretary/administrator of Steps: 3 Bathroom Shower/Tub: Health visitor: Standard Home Care Services: No Additional Comments: Pt lives with wife who is available 24/7, granddaughter works as Lawyer at an ALF and is there at times.  Discharge Living  Setting Plans for Discharge Living Setting: Patient's home, Lives with (comment) (spouse) Type of Home at Discharge: House Discharge Home Layout: One level Discharge Home Access: Stairs to enter Entrance Stairs-Rails: None Entrance Stairs-Number of Steps: 3 Discharge Bathroom Shower/Tub: Walk-in shower Discharge Bathroom Toilet: Standard Discharge Bathroom Accessibility: Yes How Accessible: Accessible via walker Does the patient have any problems obtaining your medications?: No  Social/Family/Support Systems Patient Roles: Spouse Anticipated Caregiver: spouse, Roxxane Newbern Anticipated Caregiver's Contact Information: [phone] Ability/Limitations of Caregiver: none stated Caregiver Availability: 24/7 Discharge Plan Discussed with Primary Caregiver: Yes Is Caregiver In Agreement with Plan?: Yes Does Caregiver/Family have Issues with Lodging/Transportation while Pt is in Rehab?: No   Goals Patient/Family Goal for Rehab: PT/OT supervision to min assist, SLP n/a Expected length of stay: 8-12 days Additional Information: Discharge plan: home  with spouse who can provide 24/7 supervision to min assist with ADLs if needed Pt/Family Agrees to Admission and willing to participate: Yes Program Orientation Provided & Reviewed with Pt/Caregiver Including Roles  & Responsibilities: Yes Pt will actually probably need ~ 3 weeks due to central cord syndrome  Decrease burden of Care through IP rehab admission: n/a   Possible need for SNF placement upon discharge:Not anticipated.  Plan for discharge home with spouse who can provide 24/7 supervision and up to min assist with ADL tasks.    Patient Condition: This patient's condition remains as documented in the consult dated 04/19/24, in which the Rehabilitation Physician determined and documented that the patient's condition is appropriate for intensive rehabilitative care in an inpatient rehabilitation facility. Will admit to inpatient rehab  today.  Preadmission Screen Completed By:  Reche FORBES Lowers, PT, DPT 04/20/2024 12:12 PM ______________________________________________________________________   Discussed status with Dr. Dajanique Robley on 04/21/24 at  3:14 PM  and received approval for admission today.  Admission Coordinator:  Caitlin E Warren, PT, DPT time 3:14 PM Pattricia 04/21/24

## 2024-04-18 NOTE — Plan of Care (Signed)
   Problem: Education: Goal: Knowledge of General Education information will improve Description Including pain rating scale, medication(s)/side effects and non-pharmacologic comfort measures Outcome: Progressing

## 2024-04-18 NOTE — TOC Progression Note (Signed)
 Transition of Care [facility]) - Progression Note    Patient Details  Name: Jerome Vaughn MRN: [id number] Date of Birth: 1951/01/23  [facility] CM/SW Contact  Lindsey Hommel E Kaelan Amble, LCSW Phone Number: 04/18/2024, 3:48 PM  Clinical Narrative:    TOC is following for CIR eligibility. Procedure tomorrow.      Barriers to Discharge: No Barriers Identified  Expected Discharge Plan and Services         Expected Discharge Date: 04/16/24               DME Arranged: Community education officer wheelchair with seat cushion, Bedside commode, 3-N-1 DME Agency: [employer] Date DME Agency Contacted: 04/16/24 Time DME Agency Contacted: 1256 Representative spoke with at DME Agency: [facility] HH Arranged: PT, OT HH Agency: [facility] Date [facility] Agency Contacted: 04/16/24 Time HH Agency Contacted: 1248 Representative spoke with at [facility] Agency: [facility]   Social Determinants of Health (SDOH) Interventions SDOH Screenings   Food Insecurity: No Food Insecurity (04/16/2024)  Housing: Low Risk  (04/16/2024)  Transportation Needs: No Transportation Needs (04/16/2024)  Utilities: Not At Risk (04/18/2024)  Social Connections: Socially Integrated (04/16/2024)  Tobacco Use: Low Risk  (04/15/2024)    Readmission Risk Interventions     No data to display

## 2024-04-18 NOTE — Progress Notes (Signed)
    Providing Compassionate, Quality Care - Together   NEUROSURGERY PROGRESS NOTE     S: No issues overnight.    O: EXAM:  BP (!) 165/84 (BP Location: Left Arm)   Pulse 61   Temp 98.2 F (36.8 C) (Oral)   Resp 15   Ht 5' 10 (1.778 m)   Wt 76.2 kg   SpO2 96%   BMI 24.11 kg/m     Awake, alert, oriented Speech fluent, appropriate 5/5 BLE RUE 2/5 distally, 3/5 proximally LUE 3/5 distally, 4/5 proximally SILT x4 Collar in place   ASSESSMENT:  73 y.o. 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   Camie Pickle, [facility]

## 2024-04-18 NOTE — Progress Notes (Signed)
 Mobility Specialist Progress Note:    04/18/24 1058  Mobility  Activity Transferred from bed to chair  Level of Assistance Moderate assist, patient does 50-74%  Assistive Device None  Distance Ambulated (ft) 3 ft  Activity Response Tolerated well  Mobility Referral Yes  Mobility visit 1 Mobility  Mobility Specialist Start Time (ACUTE ONLY) 0949  Mobility Specialist Stop Time (ACUTE ONLY) 0958  Mobility Specialist Time Calculation (min) (ACUTE ONLY) 9 min   Received pt in bed and agreeable to mobility. Pt required MinA EOB and ModA STS. Pt needed x2 attempts to fully clear buttocks from bed. Left in chair with personal belongings and call light within reach. All needs met.  Lavanda Pollack Mobility Specialist  Please contact via [employer] or  Rehab Office [phone]

## 2024-04-18 NOTE — Progress Notes (Signed)
 Occupational Therapy Treatment Patient Details Name: Trase Curran MRN: [id number] DOB: 1951-05-02 Today's Date: 04/18/2024   History of present illness Atzin Hazelbaker is a 73 y.o. male who presented to [facility] 04/15/24 via EMS as a level 1 trauma for hypotension. He was on his roof cutting a tree branch when it fell on him. Rec'd 2u pRBC and 2FFP. MRI 7/19 demonstrates no acute fracture or ligamentous injury in the cervical spine, but focal hyperintense T2 weighted signal within the spinal cord at C3-4 levels and at C7-indicate myelomalacia or spinal cord edema; incidentally also noted severe spinal canal stenosis and bilateral neural foraminal stenosis at C6-7 and mild spinal canal stenosis with severe bilateral neuroforaminal stenosis at C3-4, moderate left C2-3, left C4-5, and bilateral C5-6 neural foraminal stenosis.  Plan for anterior fusion/decompression C6-7. No PMHx on file.   OT comments  Pt sitting up in recliner upon therapy arrival with Wife present visiting. Pt noted to have slid forward some in chair and OT provided assist for better sitting posture and positioning. Pt education provided on proper positioning of BUE with use of pillows to also provide assist with pain management. Pt provided with soft touch call light to increase independence with help request. Pt education provided on follow up therapy that required >3 hours per [place]. Recommended bilateral wrist cock up splints with PA approving order placement via secure chat.  Will provide yellow resistive foam block later today to work on grip strength. Patient will benefit from intensive inpatient follow-up therapy, >3 hours/[facility].       If plan is discharge home, recommend the following:  A lot of help with walking and/or transfers;A lot of help with bathing/dressing/bathroom;Assistance with feeding;Help with stairs or ramp for entrance;Assist for transportation   Equipment Recommendations  BSC/3in1;Wheelchair (measurements OT);Wheelchair  cushion (measurements OT);Hospital bed    Recommendations for Other Services Rehab consult    Precautions / Restrictions Precautions Precautions: Fall Recall of Precautions/Restrictions: Intact Required Braces or Orthoses: Cervical Brace Cervical Brace: Soft collar;At all times Restrictions Weight Bearing Restrictions Per Provider Order: No             Extremity/Trunk Assessment Upper Extremity Assessment RUE Deficits / Details: Able to partially extend fingers. Very weak gross grasp noted. No active wrist movement demonstrated. Able to demonstrate elbow flexion/extension actively [facility] although increased effort needed with 3/5 strength. Shoulder flexion/abduction 3-/5 demonstrating ~25-30% A/ROM. LUE Deficits / Details: demonstrating more hand function in left hand versus right. Able to open and close fingers into loose gross grasp. No active wrist movement demonstrated. Able to demonstrate elbow flexion/extension WFL at 3/5 MMT. shoulder flexion/abduction 3-/5 while demonstrating ~40-50% A/ROM.       Cervical / Trunk Assessment Cervical / Trunk Assessment: Other exceptions Cervical / Trunk Exceptions: Cervical precautions in soft collar. Able to demonstrate bilateral shoulder elevation    Vision Baseline Vision/History: 1 Wears glasses           Communication Communication Communication: No apparent difficulties   Cognition Arousal: Alert Behavior During Therapy: WFL for tasks assessed/performed Cognition: No apparent impairments        Following commands: Intact        Cueing   Cueing Techniques: Verbal cues, Tactile cues        General Comments Pt educated on use of soft touch call light to allow him to request assistance as needed. Nursing informed of call light use. Educated pt on proper positioning and support for BUE while in recliner and/or bed with additional  pillows provided and placed during session.    Pertinent Vitals/ Pain       Pain  Assessment Pain Assessment: Faces Faces Pain Scale: Hurts little more Pain Location: Shoulders, UEs, neck Pain Descriptors / Indicators: Discomfort, Aching, Tender, Sore Pain Intervention(s): Limited activity within patient's tolerance, Monitored during session, Repositioned         Frequency  Min 2X/week        Progress Toward Goals  OT Goals(current goals can now be found in the care plan section)  Progress towards OT goals: Progressing toward goals            AM-PAC OT 6 Clicks Daily Activity     Outcome Measure   Help from another person eating meals?: A Lot Help from another person taking care of personal grooming?: Total Help from another person toileting, which includes using toliet, bedpan, or urinal?: Total Help from another person bathing (including washing, rinsing, drying)?: Total Help from another person to put on and taking off regular upper body clothing?: Total Help from another person to put on and taking off regular lower body clothing?: Total 6 Click Score: 7    End of Session    OT Visit Diagnosis: Unsteadiness on feet (R26.81);Other abnormalities of gait and mobility (R26.89);Muscle weakness (generalized) (M62.81);Pain;Other symptoms and signs involving the nervous system (R29.898) Pain - part of body: Shoulder;Arm;Hand (neck)   Activity Tolerance Patient tolerated treatment well   Patient Left in chair;with call bell/phone within reach;with family/visitor present   Nurse Communication Other (comment) (call light changed to soft touch)        Time: 8942-8874 OT Time Calculation (min): 28 min  Charges: OT General Charges $OT Visit: 1 Visit OT Treatments $Self Care/Home Management : 23-37 mins  Leita Howell, OTR/L,CBIS  Supplemental OT - [facility] and [facility] Secure Chat Preferred    Johanthan Kneeland, Leita BIRCH 04/18/2024, 2:33 PM

## 2024-04-19 ENCOUNTER — Inpatient Hospital Stay (HOSPITAL_COMMUNITY)

## 2024-04-19 ENCOUNTER — Encounter (HOSPITAL_COMMUNITY): Payer: Self-pay

## 2024-04-19 ENCOUNTER — Inpatient Hospital Stay (HOSPITAL_COMMUNITY): Payer: Self-pay

## 2024-04-19 ENCOUNTER — Other Ambulatory Visit: Payer: Self-pay

## 2024-04-19 ENCOUNTER — Encounter (HOSPITAL_COMMUNITY): Admission: EM | Disposition: A | Discharge status: Rehabilitation Facility | Payer: Self-pay | Source: Home / Self Care

## 2024-04-19 DIAGNOSIS — K592 Neurogenic bowel, not elsewhere classified: Secondary | ICD-10-CM

## 2024-04-19 DIAGNOSIS — S14123D Central cord syndrome at C3 level of cervical spinal cord, subsequent encounter: Secondary | ICD-10-CM

## 2024-04-19 DIAGNOSIS — I10 Essential (primary) hypertension: Secondary | ICD-10-CM

## 2024-04-19 DIAGNOSIS — M4802 Spinal stenosis, cervical region: Secondary | ICD-10-CM | POA: Diagnosis not present

## 2024-04-19 DIAGNOSIS — E119 Type 2 diabetes mellitus without complications: Secondary | ICD-10-CM | POA: Diagnosis not present

## 2024-04-19 HISTORY — PX: ANTERIOR CERVICAL DECOMP/DISCECTOMY FUSION: SHX1161

## 2024-04-19 LAB — BASIC METABOLIC PANEL WITH GFR
Anion gap: 8 (ref 5–15)
BUN: 14 mg/dL (ref 8–23)
CO2: 23 mmol/L (ref 22–32)
Calcium: 9.2 mg/dL (ref 8.9–10.3)
Chloride: 101 mmol/L (ref 98–111)
Creatinine, Ser: 1 mg/dL (ref 0.61–1.24)
GFR, Estimated: >60 mL/min (ref >60)
Glucose, Bld: 134 mg/dL — ABNORMAL HIGH (ref 70–99)
Potassium: 3.9 mmol/L (ref 3.5–5.1)
Sodium: 132 mmol/L — ABNORMAL LOW (ref 135–145)

## 2024-04-19 LAB — CBC
HCT: 38 % — ABNORMAL LOW (ref 39.0–52.0)
Hemoglobin: 12.7 g/dL — ABNORMAL LOW (ref 13.0–17.0)
MCH: 29.3 pg (ref 26.0–34.0)
MCHC: 33.4 g/dL (ref 30.0–36.0)
MCV: 87.6 fL (ref 80.0–100.0)
Platelets: 94 K/uL — ABNORMAL LOW (ref 150–400)
RBC: 4.34 MIL/uL (ref 4.22–5.81)
RDW: 14 % (ref 11.5–15.5)
WBC: 5 K/uL (ref 4.0–10.5)
nRBC: 0 % (ref 0.0–0.2)

## 2024-04-19 LAB — GLUCOSE, CAPILLARY
Glucose-Capillary: 129 mg/dL — ABNORMAL HIGH (ref 70–99)
Glucose-Capillary: 125 mg/dL — ABNORMAL HIGH (ref 70–99)
Glucose-Capillary: 98 mg/dL (ref 70–99)
Glucose-Capillary: 128 mg/dL — ABNORMAL HIGH (ref 70–99)
Glucose-Capillary: 151 mg/dL — ABNORMAL HIGH (ref 70–99)
Glucose-Capillary: 240 mg/dL — ABNORMAL HIGH (ref 70–99)

## 2024-04-19 LAB — SURGICAL PCR SCREEN
MRSA, PCR: NEGATIVE
Staphylococcus aureus: NEGATIVE

## 2024-04-19 LAB — TYPE AND SCREEN
ABO/RH(D): A POS
Antibody Screen: NEGATIVE

## 2024-04-19 SURGERY — ANTERIOR CERVICAL DECOMPRESSION/DISCECTOMY FUSION 1 LEVEL
Anesthesia: General

## 2024-04-19 MED ORDER — KETAMINE HCL 10 MG/ML IJ SOLN
INTRAMUSCULAR | Status: DC | PRN
Start: 2024-04-19 — End: 2024-04-19
  Administered 2024-04-19: 30 mg via INTRAVENOUS
  Administered 2024-04-19: 10 mg via INTRAVENOUS

## 2024-04-19 MED ORDER — OXYCODONE HCL 5 MG/5ML PO SOLN: 5.0000 mg | Freq: Once | ORAL | Status: DC | PRN

## 2024-04-19 MED ORDER — ACETAMINOPHEN 500 MG PO TABS: 1000.0000 mg | ORAL_TABLET | Freq: Once | ORAL | Status: DC

## 2024-04-19 MED ORDER — PROPOFOL 10 MG/ML IV BOLUS
INTRAVENOUS | Status: DC | PRN
  Administered 2024-04-19: 200 mg via INTRAVENOUS
  Administered 2024-04-19 (×2): 30 mg via INTRAVENOUS
  Administered 2024-04-19: 40 mg via INTRAVENOUS

## 2024-04-19 MED ORDER — KETAMINE HCL 50 MG/5ML IJ SOSY
PREFILLED_SYRINGE | INTRAMUSCULAR | Status: AC
  Filled 2024-04-19: qty 5

## 2024-04-19 MED ORDER — ONDANSETRON HCL 4 MG/2ML IJ SOLN: 4.0000 mg | Freq: Once | INTRAMUSCULAR | Status: DC | PRN

## 2024-04-19 MED ORDER — PROPOFOL 10 MG/ML IV BOLUS
INTRAVENOUS | Status: AC
  Filled 2024-04-19: qty 20

## 2024-04-19 MED ORDER — LIDOCAINE 2% (20 MG/ML) 5 ML SYRINGE
INTRAMUSCULAR | Status: DC | PRN
  Administered 2024-04-19: 80 mg via INTRAVENOUS

## 2024-04-19 MED ORDER — LIDOCAINE-EPINEPHRINE 1 %-1:100000 IJ SOLN
INTRAMUSCULAR | Status: DC | PRN
  Administered 2024-04-19: 5 mL

## 2024-04-19 MED ORDER — ORAL CARE MOUTH RINSE: 15.0000 mL | OROMUCOSAL | Status: DC | PRN

## 2024-04-19 MED ORDER — FENTANYL CITRATE (PF) 250 MCG/5ML IJ SOLN
INTRAMUSCULAR | Status: AC
  Filled 2024-04-19: qty 5

## 2024-04-19 MED ORDER — AMISULPRIDE (ANTIEMETIC) 5 MG/2ML IV SOLN: 10.0000 mg | Freq: Once | INTRAVENOUS | Status: DC | PRN

## 2024-04-19 MED ORDER — LIDOCAINE 2% (20 MG/ML) 5 ML SYRINGE
INTRAMUSCULAR | Status: AC
  Filled 2024-04-19: qty 5

## 2024-04-19 MED ORDER — ROCURONIUM BROMIDE 10 MG/ML (PF) SYRINGE
PREFILLED_SYRINGE | INTRAVENOUS | Status: DC | PRN
  Administered 2024-04-19: 40 mg via INTRAVENOUS
  Administered 2024-04-19: 20 mg via INTRAVENOUS

## 2024-04-19 MED ORDER — CEFAZOLIN SODIUM-DEXTROSE 2-4 GM/100ML-% IV SOLN
2.0000 g | INTRAVENOUS | Status: AC
  Administered 2024-04-19: 2 g via INTRAVENOUS

## 2024-04-19 MED ORDER — CEFAZOLIN SODIUM-DEXTROSE 2-4 GM/100ML-% IV SOLN
2.0000 g | Freq: Four times a day (QID) | INTRAVENOUS | Status: AC
  Administered 2024-04-19 (×2): 2 g via INTRAVENOUS
  Filled 2024-04-19 (×2): qty 100

## 2024-04-19 MED ORDER — SUCCINYLCHOLINE CHLORIDE 200 MG/10ML IV SOSY
PREFILLED_SYRINGE | INTRAVENOUS | Status: DC | PRN
  Administered 2024-04-19: 140 mg via INTRAVENOUS

## 2024-04-19 MED ORDER — SODIUM CHLORIDE 0.9% FLUSH
3.0000 mL | Freq: Two times a day (BID) | INTRAVENOUS | Status: DC
  Administered 2024-04-20 – 2024-04-21 (×3): 3 mL via INTRAVENOUS

## 2024-04-19 MED ORDER — 0.9 % SODIUM CHLORIDE (POUR BTL) OPTIME
TOPICAL | Status: DC | PRN
  Administered 2024-04-19: 1000 mL

## 2024-04-19 MED ORDER — ACETAMINOPHEN 650 MG RE SUPP: 650.0000 mg | RECTAL | Status: DC | PRN

## 2024-04-19 MED ORDER — LACTATED RINGERS IV SOLN: INTRAVENOUS | Status: DC | PRN

## 2024-04-19 MED ORDER — ROCURONIUM BROMIDE 10 MG/ML (PF) SYRINGE
PREFILLED_SYRINGE | INTRAVENOUS | Status: AC
  Filled 2024-04-19: qty 10

## 2024-04-19 MED ORDER — SODIUM CHLORIDE 0.9% FLUSH: 3.0000 mL | INTRAVENOUS | Status: DC | PRN

## 2024-04-19 MED ORDER — LACTATED RINGERS IV SOLN: INTRAVENOUS | Status: DC

## 2024-04-19 MED ORDER — ORAL CARE MOUTH RINSE: 15.0000 mL | Freq: Once | OROMUCOSAL | Status: AC

## 2024-04-19 MED ORDER — CHLORHEXIDINE GLUCONATE 0.12 % MT SOLN
OROMUCOSAL | Status: AC
  Administered 2024-04-19: 15 mL via OROMUCOSAL
  Filled 2024-04-19: qty 15

## 2024-04-19 MED ORDER — FENTANYL CITRATE (PF) 250 MCG/5ML IJ SOLN
INTRAMUSCULAR | Status: DC | PRN
  Administered 2024-04-19 (×4): 50 ug via INTRAVENOUS

## 2024-04-19 MED ORDER — MENTHOL 3 MG MT LOZG: 1.0000 | LOZENGE | OROMUCOSAL | Status: DC | PRN

## 2024-04-19 MED ORDER — LIDOCAINE-EPINEPHRINE 1 %-1:100000 IJ SOLN
INTRAMUSCULAR | Status: AC
  Filled 2024-04-19: qty 1

## 2024-04-19 MED ORDER — PHENOL 1.4 % MT LIQD: 1.0000 | OROMUCOSAL | Status: DC | PRN

## 2024-04-19 MED ORDER — SUGAMMADEX SODIUM 200 MG/2ML IV SOLN
INTRAVENOUS | Status: DC | PRN
  Administered 2024-04-19: 200 mg via INTRAVENOUS

## 2024-04-19 MED ORDER — KETOROLAC TROMETHAMINE 15 MG/ML IJ SOLN
7.5000 mg | Freq: Four times a day (QID) | INTRAMUSCULAR | Status: AC
  Administered 2024-04-19 – 2024-04-20 (×4): 7.5 mg via INTRAVENOUS
  Filled 2024-04-19 (×4): qty 1

## 2024-04-19 MED ORDER — DEXAMETHASONE SODIUM PHOSPHATE 10 MG/ML IJ SOLN
INTRAMUSCULAR | Status: DC | PRN
  Administered 2024-04-19: 8 mg via INTRAVENOUS

## 2024-04-19 MED ORDER — MIDAZOLAM HCL 2 MG/2ML IJ SOLN
INTRAMUSCULAR | Status: AC
  Filled 2024-04-19: qty 2

## 2024-04-19 MED ORDER — CHLORHEXIDINE GLUCONATE 0.12 % MT SOLN: 15.0000 mL | Freq: Once | OROMUCOSAL | Status: AC

## 2024-04-19 MED ORDER — OXYCODONE HCL 5 MG PO TABS: 5.0000 mg | ORAL_TABLET | Freq: Once | ORAL | Status: DC | PRN

## 2024-04-19 MED ORDER — MIDAZOLAM HCL 2 MG/2ML IJ SOLN
INTRAMUSCULAR | Status: DC | PRN
  Administered 2024-04-19: 2 mg via INTRAVENOUS

## 2024-04-19 MED ORDER — SODIUM CHLORIDE 0.9 % IV SOLN
250.0000 mL | INTRAVENOUS | Status: AC
  Administered 2024-04-19: 250 mL via INTRAVENOUS

## 2024-04-19 MED ORDER — PROPOFOL 500 MG/50ML IV EMUL
INTRAVENOUS | Status: DC | PRN
  Administered 2024-04-19: 150 ug/kg/min via INTRAVENOUS

## 2024-04-19 MED ORDER — ACETAMINOPHEN 325 MG PO TABS: 650.0000 mg | ORAL_TABLET | ORAL | Status: DC | PRN

## 2024-04-19 MED ORDER — PHENYLEPHRINE 80 MCG/ML (10ML) SYRINGE FOR IV PUSH (FOR BLOOD PRESSURE SUPPORT)
PREFILLED_SYRINGE | INTRAVENOUS | Status: DC | PRN
  Administered 2024-04-19: 80 ug via INTRAVENOUS
  Administered 2024-04-19: 40 ug via INTRAVENOUS

## 2024-04-19 MED ORDER — ONDANSETRON HCL 4 MG/2ML IJ SOLN
INTRAMUSCULAR | Status: DC | PRN
  Administered 2024-04-19: 4 mg via INTRAVENOUS

## 2024-04-19 MED ORDER — HYDROMORPHONE HCL 1 MG/ML IJ SOLN: 0.2500 mg | INTRAMUSCULAR | Status: DC | PRN

## 2024-04-19 MED ORDER — THROMBIN 5000 UNITS EX SOLR
OROMUCOSAL | Status: DC | PRN
  Administered 2024-04-19: 5 mL via TOPICAL

## 2024-04-19 MED ORDER — THROMBIN 5000 UNITS EX KIT
PACK | CUTANEOUS | Status: AC
  Filled 2024-04-19: qty 1

## 2024-04-19 SURGICAL SUPPLY — 55 items
BAG COUNTER SPONGE SURGICOUNT (BAG) ×1 IMPLANT
BAND RUBBER #18 3X1/16 STRL (MISCELLANEOUS) ×2 IMPLANT
BASKET BONE COLLECTION (BASKET) IMPLANT
BENZOIN TINCTURE PRP APPL 2/3 (GAUZE/BANDAGES/DRESSINGS) ×1 IMPLANT
BIT DRILL NEURO 2X3.1 SFT TUCH (MISCELLANEOUS) ×1 IMPLANT
BLADE CLIPPER SURG (BLADE) IMPLANT
BUR MATCHSTICK NEURO 3.0 LAGG (BURR) ×1 IMPLANT
CANISTER SUCTION 3000ML PPV (SUCTIONS) ×1 IMPLANT
DERMABOND ADVANCED .7 DNX12 (GAUZE/BANDAGES/DRESSINGS) IMPLANT
DEVICE EDNSKLTN TC NNLCK MED 8 (Cage) IMPLANT
DRAPE C-ARM 42X72 X-RAY (DRAPES) ×2 IMPLANT
DRAPE LAPAROTOMY 100X72 PEDS (DRAPES) ×1 IMPLANT
DRAPE MICROSCOPE SLANT 54X150 (MISCELLANEOUS) ×1 IMPLANT
DRAPE SHEET LG 3/4 BI-LAMINATE (DRAPES) ×1 IMPLANT
DRESSING MEPILEX FLEX 4X4 (GAUZE/BANDAGES/DRESSINGS) IMPLANT
DRSG OPSITE 4X5.5 SM (GAUZE/BANDAGES/DRESSINGS) ×2 IMPLANT
DRSG OPSITE POSTOP 3X4 (GAUZE/BANDAGES/DRESSINGS) ×1 IMPLANT
DURAPREP 26ML APPLICATOR (WOUND CARE) ×1 IMPLANT
ELECT COATED BLADE 2.86 ST (ELECTRODE) ×1 IMPLANT
ELECTRODE REM PT RTRN 9FT ADLT (ELECTROSURGICAL) ×1 IMPLANT
GAUZE 4X4 16PLY ~~LOC~~+RFID DBL (SPONGE) IMPLANT
GLOVE BIO SURGEON STRL SZ7 (GLOVE) ×2 IMPLANT
GLOVE BIOGEL PI IND STRL 8 (GLOVE) ×2 IMPLANT
GLOVE ECLIPSE 8.0 STRL XLNG CF (GLOVE) ×1 IMPLANT
GOWN STRL REUS W/ TWL LRG LVL3 (GOWN DISPOSABLE) ×2 IMPLANT
GOWN STRL REUS W/ TWL XL LVL3 (GOWN DISPOSABLE) ×2 IMPLANT
GOWN STRL REUS W/TWL 2XL LVL3 (GOWN DISPOSABLE) IMPLANT
HEMOSTAT POWDER KIT SURGIFOAM (HEMOSTASIS) ×1 IMPLANT
KIT BASIN OR (CUSTOM PROCEDURE TRAY) ×1 IMPLANT
KIT TURNOVER KIT B (KITS) ×1 IMPLANT
NDL HYPO 22X1.5 SAFETY MO (MISCELLANEOUS) ×1 IMPLANT
NDL SPNL 22GX3.5 QUINCKE BK (NEEDLE) ×1 IMPLANT
NEEDLE HYPO 22X1.5 SAFETY MO (MISCELLANEOUS) ×1 IMPLANT
NEEDLE SPNL 22GX3.5 QUINCKE BK (NEEDLE) ×1 IMPLANT
NS IRRIG 1000ML POUR BTL (IV SOLUTION) ×1 IMPLANT
PACK LAMINECTOMY NEURO (CUSTOM PROCEDURE TRAY) ×1 IMPLANT
PAD ARMBOARD POSITIONER FOAM (MISCELLANEOUS) ×3 IMPLANT
PATTIES SURGICAL .5 X3 (DISPOSABLE) ×1 IMPLANT
PIN DISTRACTION 14MM (PIN) IMPLANT
PIN DISTRACTION TAP 12 DISP (PIN) ×2 IMPLANT
PLATE ZEVO 1LVL 19MM (Plate) IMPLANT
PUTTY DBF 1CC CORTICAL FIBERS (Putty) IMPLANT
SCREW VA SD 3.5X16 (Screw) IMPLANT
SPIKE FLUID TRANSFER (MISCELLANEOUS) ×1 IMPLANT
SPONGE INTESTINAL PEANUT (DISPOSABLE) ×1 IMPLANT
STAPLER SKIN PROX 35W (STAPLE) IMPLANT
STRIP CLOSURE SKIN 1/2X4 (GAUZE/BANDAGES/DRESSINGS) ×1 IMPLANT
SUT MNCRL AB 4-0 PS2 18 (SUTURE) ×1 IMPLANT
SUT SILK 2 0 TIES 10X30 (SUTURE) IMPLANT
SUT VIC AB 3-0 SH 8-18 (SUTURE) ×1 IMPLANT
TAPE CLOTH 3X10 TAN LF (GAUZE/BANDAGES/DRESSINGS) ×1 IMPLANT
TIP KERRISON THIN FOOTPLATE 2M (MISCELLANEOUS) ×1 IMPLANT
TOWEL GREEN STERILE (TOWEL DISPOSABLE) ×1 IMPLANT
TOWEL GREEN STERILE FF (TOWEL DISPOSABLE) ×1 IMPLANT
WATER STERILE IRR 1000ML POUR (IV SOLUTION) ×1 IMPLANT

## 2024-04-19 NOTE — Transfer of Care (Signed)
 Immediate Anesthesia Transfer of Care Note  Patient: Jerome Vaughn  Procedure(s) Performed: ANTERIOR CERVICAL DECOMPRESSION/DISCECTOMY FUSION CERVICAL SIX-SEVEN  Patient Location: PACU  Anesthesia Type:General  Level of Consciousness: awake and sedated  Airway & Oxygen Therapy: Patient Spontanous Breathing and Patient connected to face mask oxygen  Post-op Assessment: Report given to RN and Post -op Vital signs reviewed and stable  Post vital signs: Reviewed and stable  Last Vitals:  Vitals Value Taken Time  BP 147/86 04/19/24 16:37  Temp    Pulse 73 04/19/24 16:39  Resp 13 04/19/24 16:39  SpO2 97 % 04/19/24 16:39  Vitals shown include unfiled device data.  Last Pain:  Vitals:   04/19/24 1317  TempSrc:   PainSc: 5       Patients Stated Pain Goal: 0 (04/17/24 0855)  Complications: No notable events documented.

## 2024-04-19 NOTE — Progress Notes (Signed)
 Attempted to ambulate with patient, pt unable to tolerate. Generalized weakness noted with pt able to stand however displays difficulty standing for more than a few seconds despited 2 person assist. Pt able to stand and transfer to bedside commode and back to bed with  moderate assist from staff.

## 2024-04-19 NOTE — Anesthesia Preprocedure Evaluation (Addendum)
 Anesthesia Evaluation  Patient identified by MRN, date of birth, ID band Patient awake    Reviewed: Allergy & Precautions, NPO status , Patient's Chart, lab work & pertinent test results, reviewed documented beta blocker date and time   Airway Mallampati: III  TM Distance: >3 FB Neck ROM: Full    Dental  (+) Edentulous Upper, Dental Advisory Given   Pulmonary neg pulmonary ROS   Pulmonary exam normal breath sounds clear to auscultation       Cardiovascular hypertension (136/80 preop), Pt. on medications and Pt. on home beta blockers Normal cardiovascular exam Rhythm:Regular Rate:Normal     Neuro/Psych Presented initially 7/18 with neurogenic shock 2/2 widening of the left C3-C4 facet with central cord syndrome  negative psych ROS   GI/Hepatic Neg liver ROS,GERD  Medicated and Controlled,,  Endo/Other  diabetes, Well Controlled, Type 2, Oral Hypoglycemic Agents    Renal/GU negative Renal ROS  negative genitourinary   Musculoskeletal negative musculoskeletal ROS (+)    Abdominal   Peds  Hematology negative hematology ROS (+) Hb 12.7, plt 94   Anesthesia Other Findings Ozempic LD: 7/18  Reproductive/Obstetrics negative OB ROS                              Anesthesia Physical Anesthesia Plan  ASA: 3  Anesthesia Plan: General   Post-op Pain Management: Tylenol  PO (pre-op)*   Induction: Intravenous, Rapid sequence and Cricoid pressure planned  PONV Risk Score and Plan: 2 and Ondansetron , Dexamethasone , Midazolam  and Treatment may vary due to age or medical condition  Airway Management Planned: Oral ETT and Video Laryngoscope Planned  Additional Equipment: None  Intra-op Plan:   Post-operative Plan: Extubation in OR  Informed Consent: I have reviewed the patients History and Physical, chart, labs and discussed the procedure including the risks, benefits and alternatives for the  proposed anesthesia with the patient or authorized representative who has indicated his/her understanding and acceptance.     Dental advisory given  Plan Discussed with: CRNA  Anesthesia Plan Comments: (Has not bee off ozempic for 7d, will RSI/glide)         Anesthesia Quick Evaluation

## 2024-04-19 NOTE — Plan of Care (Signed)
   Problem: Education: Goal: Knowledge of General Education information will improve Description: Including pain rating scale, medication(s)/side effects and non-pharmacologic comfort measures Outcome: Progressing   Problem: Clinical Measurements: Goal: Ability to maintain clinical measurements within normal limits will improve Outcome: Progressing Goal: Will remain free from infection Outcome: Progressing

## 2024-04-19 NOTE — Op Note (Signed)
 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 Arthrodesis C6-7, anterior interbody technique, including Discectomy for decompression of spinal cord and exiting nerve roots with foraminotomies  2. Placement of intervertebral biomechanical device C6-7 3. Placement of anterior instrumentation consisting of interbody plate and screws C6-7 4. Use of morselized bone allograft  5. Use of intraoperative microscope  SURGEON: Dr. Dorn Ned, MD  ASSISTANT: Camie Pickle, PA.  Please note, no qualified trainees were available to assist with the procedure.  Assistance was required for retraction of the visceral structures to safely allow for instrumentation.  ANESTHESIA: General Endotracheal  EBL: 25 ml  IMPLANTS: Medtronic 8 mm Titan C medium implant 19 mm Zevo plate 16mm screws x 4 DBF  SPECIMENS: None  DRAINS: None  COMPLICATIONS: None immediate  CONDITION: Hemodynamically stable to PACU  HISTORY: Jerome Vaughn is a 73 y.o. y.o. male who was hit by a tree while roofing and suffered neurogenic shock and was noted to have central cord syndrome.  Initial CT C-spine showed no fracture but MRI showed cord signal change at C3-4 as well as at C6-7.  At C6-7, there is severe stenosis from broad disc osteophyte causing cord impingement, though at C3-4 there was no ongoing stenosis.  MRI also did not show any disc or ligamentous rupture or facet disruption that would cause mechanical instability.  I had a long discussion with the patient and discussed with him the natural history of central cord syndrome.  I explained that for purposes of maximizing his spinal cord injury recovery potential, I recommended surgery to address his severe stenosis at C6-7 with ACDF surgery.  Risks, benefits, alternatives, and expected convalescence were discussed with the patient.  Risks discussed included but  were not limited to bleeding, pain, infection, dysphagia, dysphonia, pseudoarthrosis, hardware failure, adjacent segment disease, CSF leak, neurologic deficits, weakness, numbness, paralysis, coma, and death. After all questions were answered, informed consent was obtained.  PROCEDURE IN DETAIL: The patient was brought to the operating room and transferred to the operative table. After induction of general anesthesia, the patient was positioned on the operative table in the supine position with all pressure points meticulously padded. The skin of the neck was then prepped and draped in the usual sterile fashion.  After timeout was conducted, the skin was infiltrated with local anesthetic. Skin incision was then made sharply and Bovie electrocautery was used to dissect the subcutaneous tissue. platysma was then divided and undermined. The sternocleidomastoid muscle was then identified and, utilizing natural fascial planes in the neck, the prevertebral fascia was identified and the carotid sheath was retracted laterally and the trachea and esophagus retracted medially. Again using fluoroscopy, the C6-7 disc space was identified. Bovie electrocautery was used to dissect in the subperiosteal plane and elevate the bilateral longus coli muscles.  Large anterior osteophytes was removed with rongeur's.  Self-retaining retractors were then placed. Caspar distraction pins were placed in the adjacent bodies to allow for gentle distraction of the collapsed disc space.  At this point, the microscope was draped and brought into the field, and the remainder of the case was done under the microscope using microdissecting technique.  The disc space was incised sharply and combination of high speed drill, curettes, and rongeurs were use to initially complete a discectomy. The high-speed drill was then used to complete discectomy until the posterior annulus was identified and removed and the posterior longitudinal ligament was  identified. Using a nerve hook, the PLL was elevated, and Kerrison rongeurs were used to remove the posterior longitudinal ligament and the ventral thecal sac was identified. Using a combination of curettes and rongeurs, complete decompression of the thecal sac and exiting nerve roots at this level was completed, and verified with easy passage of micro-nerve hook centrally and in the bilateral foramina.  Having completed our decompression, attention was turned to placement of the intervertebral device. Trial spacers were used to select a size 8 mm graft. This graft was then filled with morcellized allograft, and inserted under live fluoroscopy.  After placement of the intervertebral device, the caspar pins were removed.  An anterior cervical plate was placed across the interspaces for anterior fixation.  Using a high-speed drill, the cortex of the cervical vertebral bodies was punctured, and screws inserted in the vertebral bodies. Final fluoroscopic images in AP and lateral projections were taken to confirm good hardware placement.  At this point, after all counts were verified to be correct, meticulous hemostasis was secured using a combination of bipolar electrocautery and passive hemostatics. The platysma muscle was then closed using interrupted 3-0 Vicryl sutures, and the skin was closed with a 4-0 monocryl in subcutical fashion. Sterile dressings were then applied and the drapes removed.  The patient tolerated the procedure well and was extubated in the room and taken to the postanesthesia care unit in stable condition.  All counts were correct at the end of the procedure.

## 2024-04-19 NOTE — Progress Notes (Signed)
 Inpatient Rehab Admissions Coordinator:   Met with pt and spouse at bedside.  Plan for OR today, we will open with [facility] Medicare pending post-op therapy assessments.  PMR MD Consult completed this AM.   Reche Lowers, PT, DPT Admissions Coordinator [phone] 04/19/24  4:33 PM
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
## 2024-04-19 NOTE — Progress Notes (Signed)
 Subjective: No acute events overnight.  Still has severe weakness of hands.  He does have chronic bilateral rotator cuff issues and has similar shoulder pains currently.  Objective: Vital signs in last 24 hours: Temp:  [97.7 F (36.5 C)-98.6 F (37 C)] 98 F (36.7 C) (07/22 0745) Pulse Rate:  [63-78] 63 (07/22 0745) Resp:  [16-18] 16 (07/22 0745) BP: (131-153)/(72-87) 136/80 (07/22 0745) SpO2:  [90 %-100 %] 96 % (07/22 0745)  Intake/Output from previous [place]: 07/21 0701 - 07/22 0700 In: 240 [P.O.:240] Out: 2700 [Urine:2700] Intake/Output this shift: No intake/output data recorded.  3/5 handgrip in left hand, 2/5 in right hand 3/5 right tricep, 4-/5 left tricep, 4+/5 biceps.  Shoulder abduction limited by pain Soft collar in place  Lab Results: Recent Labs    04/18/24 0651 04/19/24 0647  WBC 3.6* 5.0  HGB 12.4* 12.7*  HCT 36.9* 38.0*  PLT 96* 94*   BMET Recent Labs    04/18/24 0651 04/19/24 0647  NA 138 132*  K 3.8 3.9  CL 103 101  CO2 25 23  GLUCOSE 129* 134*  BUN 10 14  CREATININE 0.98 1.00  CALCIUM  9.0 9.2    Studies/Results: No results found.  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  He has ongoing severe stenosis at C6-7 with significant cord signal change/contusion.  He also has some cord signal change at C3-4 but no ongoing stenosis there and no mechanical instability. - I had a long discussion with the patient and discussed with him the natural history of central cord syndrome.  I explained that for purposes of maximizing his spinal cord injury recovery potential, I am recommending surgery to address his severe stenosis at C6-7 with ACDF surgery.Risks, benefits, alternatives, and expected convalescence were discussed with the patient.  Risks discussed included, but were not limited to bleeding, pain, infection, dysphagia, dysphonia, scar, spinal fluid leak, neurologic deficit, instability, pseudoarthrosis,  adjacent segment disease, damage to nearby organs, and death.  Informed consent was obtained.    Dorn KANDICE Ned 04/19/2024, 8:18 AM

## 2024-04-19 NOTE — Anesthesia Procedure Notes (Signed)
 Procedure Name: Intubation Date/Time: 04/19/2024 2:12 PM  Performed by: Catheline Terrall NOVAK, RNPre-anesthesia Checklist: Patient identified, Emergency Drugs available, Suction available and Patient being monitored Patient Re-evaluated:Patient Re-evaluated prior to induction Oxygen Delivery Method: Circle system utilized Preoxygenation: Pre-oxygenation with 100% oxygen Induction Type: IV induction Ventilation: Mask ventilation without difficulty Laryngoscope Size: Glidescope and 4 Grade View: Grade I Tube type: Oral Tube size: 7.5 mm Number of attempts: 1 Airway Equipment and Method: Stylet and Oral airway Placement Confirmation: ETT inserted through vocal cords under direct vision, positive ETCO2 and breath sounds checked- equal and bilateral Secured at: 23 cm Tube secured with: Tape Dental Injury: Teeth and Oropharynx as per pre-operative assessment
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
## 2024-04-20 ENCOUNTER — Encounter (HOSPITAL_COMMUNITY): Payer: Self-pay | Admitting: Neurosurgery

## 2024-04-20 LAB — CBC
HCT: 37.3 % — ABNORMAL LOW (ref 39.0–52.0)
Hemoglobin: 12.6 g/dL — ABNORMAL LOW (ref 13.0–17.0)
MCH: 29.5 pg (ref 26.0–34.0)
MCHC: 33.8 g/dL (ref 30.0–36.0)
MCV: 87.4 fL (ref 80.0–100.0)
Platelets: 97 K/uL — ABNORMAL LOW (ref 150–400)
RBC: 4.27 MIL/uL (ref 4.22–5.81)
RDW: 13.8 % (ref 11.5–15.5)
WBC: 6 K/uL (ref 4.0–10.5)
nRBC: 0 % (ref 0.0–0.2)

## 2024-04-20 LAB — GLUCOSE, CAPILLARY
Glucose-Capillary: 146 mg/dL — ABNORMAL HIGH (ref 70–99)
Glucose-Capillary: 178 mg/dL — ABNORMAL HIGH (ref 70–99)
Glucose-Capillary: 174 mg/dL — ABNORMAL HIGH (ref 70–99)
Glucose-Capillary: 240 mg/dL — ABNORMAL HIGH (ref 70–99)

## 2024-04-20 MED ORDER — METHOCARBAMOL 500 MG PO TABS
500.0000 mg | ORAL_TABLET | Freq: Three times a day (TID) | ORAL | Status: DC
  Administered 2024-04-20 – 2024-04-21 (×4): 500 mg via ORAL
  Filled 2024-04-20 (×4): qty 1

## 2024-04-20 NOTE — Progress Notes (Signed)
 OT Note Pt seen for re-evaluation. Patient will benefit from intensive inpatient follow-up therapy, >3 hours/[place]. Full note to follow.  Kreg Sink, OT/L   Acute OT Clinical Specialist Acute Rehabilitation Services Pager [phone] Office [phone]
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
## 2024-04-20 NOTE — Progress Notes (Signed)
    Providing Compassionate, Quality Care - Together   NEUROSURGERY PROGRESS NOTE     S: No issues overnight. Reports pain improved.    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   Camie Pickle, [facility]
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
## 2024-04-20 NOTE — Progress Notes (Addendum)
 1 [facility] Post-Op  Subjective: Pain well controlled.  Eating breakfast right now.  Had a BM yesterday.  ROS: See above, otherwise other systems negative  Objective: Vital signs in last 24 hours: Temp:  [98 F (36.7 C)-98.7 F (37.1 C)] 98 F (36.7 C) (07/23 0815) Pulse Rate:  [61-74] 67 (07/23 0815) Resp:  [11-20] 20 (07/23 0815) BP: (130-155)/(74-96) 130/77 (07/23 0815) SpO2:  [93 %-99 %] 99 % (07/23 0815) Last BM Date : 04/18/24  Intake/Output from previous [place]: 07/22 0701 - 07/23 0700 In: 1413.1 [P.O.:240; I.V.:1000.1; IV Piggyback:173] Out: 1870 [Urine:1850; Blood:20] Intake/Output this shift: No intake/output data recorded.  PE: Gen: NAD HEENT: neck with soft collar in place Heart: regular Lungs: CTAB Abd: soft, NT GU: foley in place with yellow urine Ext: calves soft Neuro: R>L upper extremity weakness, both sides are weak with grip, biceps, triceps, stable Psych: A&Ox3  Lab Results:  Recent Labs    04/19/24 0647 04/20/24 0523  WBC 5.0 6.0  HGB 12.7* 12.6*  HCT 38.0* 37.3*  PLT 94* 97*   BMET Recent Labs    04/18/24 0651 04/19/24 0647  NA 138 132*  K 3.8 3.9  CL 103 101  CO2 25 23  GLUCOSE 129* 134*  BUN 10 14  CREATININE 0.98 1.00  CALCIUM  9.0 9.2   PT/INR No results for input(s): LABPROT, INR in the last 72 hours.  CMP     Component Value Date/Time   NA 132 (L) 04/19/2024 0647   K 3.9 04/19/2024 0647   CL 101 04/19/2024 0647   CO2 23 04/19/2024 0647   GLUCOSE 134 (H) 04/19/2024 0647   BUN 14 04/19/2024 0647   CREATININE 1.00 04/19/2024 0647   CALCIUM  9.2 04/19/2024 0647   PROT 5.8 (L) 04/15/2024 1647   ALBUMIN 3.0 (L) 04/15/2024 1647   AST 28 04/15/2024 1647   ALT 27 04/15/2024 1647   ALKPHOS 24 (L) 04/15/2024 1647   BILITOT 0.7 04/15/2024 1647   GFRNONAA >60 04/19/2024 0647   Lipase  No results found for: LIPASE     Studies/Results: DG Cervical Spine 1 View Result Date: 04/19/2024 CLINICAL DATA:  [id number]  Surgery, elective [id number]; surgery EXAM: DG CERVICAL SPINE - 1 VIEW; DG C-ARM 1-60 MIN-NO REPORT COMPARISON:  MRI cervical spine 04/16/2024 FINDINGS: Intraoperative anterior cervical discectomy and fusion of the C6-C7 level. 3 low resolution intraoperative spot views of the cervical spine were obtained. No fracture visible on the limited views. Endotracheal tube partially visualized. Total fluoroscopy time: 17.3 second Total radiation dose: 3.13 mGy IMPRESSION: Intraoperative anterior cervical discectomy and fusion of the C6-C7 level. Electronically Signed   By: Morgane  Naveau M.D.   On: 04/19/2024 16:47   DG C-Arm 1-60 Min-No Report Result Date: 04/19/2024 Fluoroscopy was utilized by the requesting physician.  No radiographic interpretation.   DG C-Arm 1-60 Min-No Report Result Date: 04/19/2024 Fluoroscopy was utilized by the requesting physician.  No radiographic interpretation.     Anti-infectives: Anti-infectives (From admission, onward)    Start     Dose/Rate Route Frequency Ordered Stop   04/19/24 1830  ceFAZolin  (ANCEF ) IVPB 2g/100 mL premix        2 g 200 mL/hr over 30 Minutes Intravenous Every 6 hours 04/19/24 1741 04/20/24 0019   04/19/24 1500  ceFAZolin  (ANCEF ) IVPB 2g/100 mL premix        2 g 200 mL/hr over 30 Minutes Intravenous 30 min pre-op 04/19/24 1255 04/19/24 1427  Assessment/Plan Fall from roof after he was hit with a tree branch Shock - transfused 2+2 7/18, hgb 11.7 from 11.6, initially required levophed , but able to be weaned off.  Likely neurogenic in nature, resolved. Widening of the left C3-C4 facet with central cord syndrome - ACDF of C6-7 level on 7/22, Dr. Debby.  Continue therapies.  Soft collar in place. DM - on Ozempic, last dose last Friday.  SSI CAD/HTN - reordered Coreg , Benazepril on hold HLD - Zetia  reordered Urinary retention - foley in place, TOV following surgery  Thrombocytopenia - plts 97K, stable FEN - carb mod VTE - lovenox  held  post op ID - none currently warranted, ancef  preop Dispo - CIR, auth submitted, medically stable when bed available  I reviewed nursing notes, Consultant NSGY notes, last 24 h vitals and pain scores, last 48 h intake and output, last 24 h labs and trends, and last 24 h imaging results.   LOS: 2 days    Burnard FORBES Banter , [facility] 04/20/2024, 8:53 AM Please see Amion for pager number during [facility] hours 7:00am-4:30pm or 7:00am -11:30am on weekends

## 2024-04-20 NOTE — Plan of Care (Signed)
  Problem: Health Behavior/Discharge Planning: Goal: Ability to manage health-related needs will improve Outcome: Not Progressing   Problem: Clinical Measurements: Goal: Ability to maintain clinical measurements within normal limits will improve Outcome: Progressing   

## 2024-04-20 NOTE — Progress Notes (Addendum)
 OT Treatment Note  Pt total A with self feeding due to inability to use hands functionally. Pt educated on use of adapted cup with WHO (wrist hand orthotic with U-cuff). With use of bumping technique, pt abl eto hold cup and drink independently after set up. Educated on use of WHO with U-cuff to hold spoon. Able to bring to mouth using both U-cuff and red built up tubing to begin feeding himself. Educated pt on BUE exercises. Excellent candidate for AIR. Very motivated.     04/20/24 1549  OT Visit Information  Last OT Received On 04/20/24  Assistance Needed +1  History of Present Illness Jerome Vaughn is a 73 y.o. male admitted 04/15/24 after a tree branch fell on him whilehe was onhis roof. 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 Pt s/p C6-7 ACDF 7/22. No PMHx on file.  Precautions  Precautions Fall  Recall of Precautions/Restrictions Intact  Required Braces or Orthoses Cervical Brace  Cervical Brace Soft collar;Other (comment) (OFF WHEN IN BED ADN FOR SHOWERS)  Restrictions  Weight Bearing Restrictions Per Provider Order No  Pain Assessment  Pain Assessment Faces  Faces Pain Scale 4  Pain Location Neck  Pain Descriptors / Indicators Discomfort;Aching  Pain Intervention(s) Limited activity within patient's tolerance  ADL  Overall ADL's  Needs assistance/impaired  Eating/Feeding Moderate assistance  Eating/Feeding Details (indicate cue type and reason) Pt educated on use of adapted/handled cup and use of bumping cup into R hand. Pt abl eto bring cup to mouth; WHO positioned with spoon in  u-cuff. Pt abl eto bring spoon to mouth; also abl eto hold spoon in red tubing however grip is weak. Discussed assessing use of both (tubing with use of WHO) able to bring spoon to mouth using both. Discussed food choice of sticky foods; may need plate guard. Discussed recommendation for phone holders for table and bed  Grooming Wash/dry face;Oral care  Grooming Details (indicate cue type and  reason) Disucssed using same set up for oral care (WHO/U-cuff; red tubing with WHO); ableto grasp cloth with L hand and bump it into R and use to reach mouth with mod A  Exercises  Exercises General Upper Extremity  General Exercises - Upper Extremity  Shoulder Flexion AAROM;Right;10 reps (to90)  Elbow Flexion AROM;AAROM;Right;10 reps;Both  Elbow Extension AROM;AAROM;Right;10 reps;Both  Wrist Flexion AROM;Right;10 reps;Both  Wrist Extension AROM;Right;10 reps;Both (PROM on R)  Digit Composite Flexion AROM;Right;Strengthening;10 reps;Squeeze ball;Both  Composite Extension AAROM;AROM;Right;10 reps;Both (completed with wrist in flexion)  OT - End of Session  Activity Tolerance Patient tolerated treatment well  Patient left in chair;with call bell/phone within reach;with chair alarm set  Nurse Communication Other (comment) (set up for self feeding; splints)  OT Assessment/Plan  OT Visit Diagnosis Unsteadiness on feet (R26.81);Other abnormalities of gait and mobility (R26.89);Muscle weakness (generalized) (M62.81);Pain;Other symptoms and signs involving the nervous system (R29.898);Ataxia, unspecified (R27.0)  Pain - Right/Left Right  Pain - part of body  (neck)  OT Frequency (ACUTE ONLY) Min 2X/week  Recommendations for Other Services Rehab consult  Follow Up Recommendations Acute inpatient rehab (3hours/Imber)  Patient can return home with the following A lot of help with walking and/or transfers;A lot of help with bathing/dressing/bathroom;Assistance with feeding;Help with stairs or ramp for entrance;Assist for transportation  OT Equipment BSC/3in1;Wheelchair (measurements OT);Wheelchair cushion (measurements OT);Hospital bed  AM-PAC OT 6 Clicks Daily Activity Outcome Measure (Version 2)  Help from another person eating meals? 2  Help from another person taking care of personal grooming? 2  Help from another person toileting, which includes using toliet, bedpan, or urinal? 1  Help from  another person bathing (including washing, rinsing, drying)? 2  Help from another person to put on and taking off regular upper body clothing? 1  Help from another person to put on and taking off regular lower body clothing? 1  6 Click Score 9  Progressive Mobility  What is the highest level of mobility based on the progressive mobility assessment? Level 4 (Walks with assist in room) - Balance while marching in place and cannot step forward and back - Complete  Mobility Referral Yes  Activity Repositioned in chair  OT Goal Progression  Progress towards OT goals Progressing toward goals  Acute Rehab OT Goals  Patient Stated Goal to feed himself  OT Goal Formulation With patient/family  Time For Goal Achievement 05/04/24  Potential to Achieve Goals Good  ADL Goals  Pt Will Perform Eating with mod assist;with assist to don/doff brace/orthosis;with adaptive utensils;sitting  Pt Will Perform Grooming with mod assist;sitting;with adaptive equipment  Pt Will Perform Upper Body Bathing with mod assist;sitting;with adaptive equipment  Pt Will Transfer to Toilet with contact guard assist;ambulating;bedside commode  Pt/caregiver will Perform Home Exercise Program With Supervision;With written HEP provided;Increased strength;Both right and left upper extremity;With theraputty  Additional ADL Goal #1 Pt will be independent in directing caregiver for splint schedule - alternating R WHO and wrist cock-up during the Hartl and sleepin in wrist cock-up splint at night.  OT Time Calculation  OT Start Time (ACUTE ONLY) 1040  OT Stop Time (ACUTE ONLY) 1103  OT Time Calculation (min) 23 min  OT Treatments  $Self Care/Home Management  23-37 mins  Kreg Sink, OT/L   Acute OT Clinical Specialist Acute Rehabilitation Services Pager [phone] Office [phone]

## 2024-04-20 NOTE — Anesthesia Postprocedure Evaluation (Signed)
 Anesthesia Post Note  Patient: Jerome Vaughn  Procedure(s) Performed: ANTERIOR CERVICAL DECOMPRESSION/DISCECTOMY FUSION CERVICAL SIX-SEVEN     Patient location during evaluation: PACU Anesthesia Type: General Level of consciousness: awake and alert, oriented and patient cooperative Pain management: pain level controlled Vital Signs Assessment: post-procedure vital signs reviewed and stable Respiratory status: spontaneous breathing, nonlabored ventilation and respiratory function stable Cardiovascular status: blood pressure returned to baseline and stable Postop Assessment: no apparent nausea or vomiting Anesthetic complications: no   No notable events documented.  Last Vitals:  Vitals:   04/19/24 2344 04/20/24 0405  BP: 132/74 139/80  Pulse: 67 63  Resp:  16  Temp: 36.9 C 37.1 C  SpO2: 99% 97%    Last Pain:  Vitals:   04/20/24 0509  TempSrc:   PainSc: 2                  Almarie CHRISTELLA Marchi

## 2024-04-20 NOTE — Progress Notes (Signed)
 Mobility Specialist Progress Note:    04/20/24 1537  Mobility  Activity Transferred from chair to bed  Level of Assistance Minimal assist, patient does 75% or more (+2 for safety)  Assistive Device Front wheel walker  Distance Ambulated (ft) 3 ft  Activity Response Tolerated well  Mobility Referral Yes  Mobility visit 1 Mobility  Mobility Specialist Start Time (ACUTE ONLY) 1400  Mobility Specialist Stop Time (ACUTE ONLY) 1410  Mobility Specialist Time Calculation (min) (ACUTE ONLY) 10 min   Received pt requesting to get back in bed. Pt required MinA +2 d/t safety. No c/o throughout. Returned pt to bed with personal belongings and call light within reach. All needs met. RN present.  Lavanda Pollack Mobility Specialist  Please contact via [employer] or  Rehab Office [phone]

## 2024-04-20 NOTE — Progress Notes (Signed)
 Inpatient Rehab Admissions Coordinator:   Sent clinicals to [facility] for prior auth request.  Will follow.   Reche Lowers, PT, DPT Admissions Coordinator [phone] 04/20/24  1:19 PM

## 2024-04-21 ENCOUNTER — Encounter: Payer: Self-pay | Admitting: Family Medicine

## 2024-04-21 ENCOUNTER — Other Ambulatory Visit: Payer: Self-pay

## 2024-04-21 ENCOUNTER — Inpatient Hospital Stay (HOSPITAL_COMMUNITY)
Admission: AD | Admit: 2024-04-21 | Discharge: 2024-05-17 | DRG: 559 | Disposition: A | Discharge status: Home / Self Care | Source: Intra-hospital | Attending: Physical Medicine and Rehabilitation | Admitting: Physical Medicine and Rehabilitation

## 2024-04-21 ENCOUNTER — Encounter (HOSPITAL_COMMUNITY): Payer: Self-pay | Admitting: Physical Medicine and Rehabilitation

## 2024-04-21 DIAGNOSIS — I700 Atherosclerosis of aorta: Secondary | ICD-10-CM | POA: Diagnosis not present

## 2024-04-21 DIAGNOSIS — D696 Thrombocytopenia, unspecified: Secondary | ICD-10-CM | POA: Diagnosis not present

## 2024-04-21 DIAGNOSIS — G8250 Quadriplegia, unspecified: Principal | ICD-10-CM

## 2024-04-21 DIAGNOSIS — Z7985 Long-term (current) use of injectable non-insulin antidiabetic drugs: Secondary | ICD-10-CM | POA: Diagnosis not present

## 2024-04-21 DIAGNOSIS — R739 Hyperglycemia, unspecified: Secondary | ICD-10-CM | POA: Diagnosis not present

## 2024-04-21 DIAGNOSIS — Z981 Arthrodesis status: Secondary | ICD-10-CM | POA: Diagnosis not present

## 2024-04-21 DIAGNOSIS — G904 Autonomic dysreflexia: Secondary | ICD-10-CM | POA: Diagnosis not present

## 2024-04-21 DIAGNOSIS — M7989 Other specified soft tissue disorders: Secondary | ICD-10-CM | POA: Diagnosis not present

## 2024-04-21 DIAGNOSIS — K5901 Slow transit constipation: Secondary | ICD-10-CM | POA: Diagnosis not present

## 2024-04-21 DIAGNOSIS — E119 Type 2 diabetes mellitus without complications: Secondary | ICD-10-CM | POA: Diagnosis present

## 2024-04-21 DIAGNOSIS — I951 Orthostatic hypotension: Secondary | ICD-10-CM | POA: Diagnosis present

## 2024-04-21 DIAGNOSIS — N319 Neuromuscular dysfunction of bladder, unspecified: Secondary | ICD-10-CM | POA: Diagnosis not present

## 2024-04-21 DIAGNOSIS — M19011 Primary osteoarthritis, right shoulder: Secondary | ICD-10-CM | POA: Diagnosis not present

## 2024-04-21 DIAGNOSIS — M25512 Pain in left shoulder: Secondary | ICD-10-CM | POA: Diagnosis not present

## 2024-04-21 DIAGNOSIS — G8254 Quadriplegia, C5-C7 incomplete: Secondary | ICD-10-CM | POA: Diagnosis not present

## 2024-04-21 DIAGNOSIS — W208XXS Other cause of strike by thrown, projected or falling object, sequela: Secondary | ICD-10-CM | POA: Diagnosis present

## 2024-04-21 DIAGNOSIS — N390 Urinary tract infection, site not specified: Secondary | ICD-10-CM | POA: Diagnosis not present

## 2024-04-21 DIAGNOSIS — R7989 Other specified abnormal findings of blood chemistry: Secondary | ICD-10-CM | POA: Diagnosis not present

## 2024-04-21 DIAGNOSIS — M19012 Primary osteoarthritis, left shoulder: Secondary | ICD-10-CM | POA: Diagnosis not present

## 2024-04-21 DIAGNOSIS — Z4789 Encounter for other orthopedic aftercare: Principal | ICD-10-CM

## 2024-04-21 DIAGNOSIS — R519 Headache, unspecified: Secondary | ICD-10-CM | POA: Diagnosis not present

## 2024-04-21 DIAGNOSIS — M25511 Pain in right shoulder: Secondary | ICD-10-CM | POA: Diagnosis not present

## 2024-04-21 DIAGNOSIS — Z79899 Other long term (current) drug therapy: Secondary | ICD-10-CM

## 2024-04-21 DIAGNOSIS — Z7984 Long term (current) use of oral hypoglycemic drugs: Secondary | ICD-10-CM

## 2024-04-21 DIAGNOSIS — E785 Hyperlipidemia, unspecified: Secondary | ICD-10-CM | POA: Diagnosis present

## 2024-04-21 DIAGNOSIS — K219 Gastro-esophageal reflux disease without esophagitis: Secondary | ICD-10-CM

## 2024-04-21 DIAGNOSIS — R079 Chest pain, unspecified: Secondary | ICD-10-CM | POA: Diagnosis not present

## 2024-04-21 DIAGNOSIS — F54 Psychological and behavioral factors associated with disorders or diseases classified elsewhere: Secondary | ICD-10-CM | POA: Diagnosis not present

## 2024-04-21 DIAGNOSIS — M549 Dorsalgia, unspecified: Secondary | ICD-10-CM | POA: Diagnosis not present

## 2024-04-21 DIAGNOSIS — R339 Retention of urine, unspecified: Secondary | ICD-10-CM | POA: Diagnosis not present

## 2024-04-21 DIAGNOSIS — K592 Neurogenic bowel, not elsewhere classified: Secondary | ICD-10-CM | POA: Diagnosis present

## 2024-04-21 DIAGNOSIS — R0789 Other chest pain: Secondary | ICD-10-CM | POA: Diagnosis not present

## 2024-04-21 DIAGNOSIS — B9620 Unspecified Escherichia coli [E. coli] as the cause of diseases classified elsewhere: Secondary | ICD-10-CM | POA: Diagnosis not present

## 2024-04-21 DIAGNOSIS — K5900 Constipation, unspecified: Secondary | ICD-10-CM | POA: Diagnosis present

## 2024-04-21 DIAGNOSIS — S14129A Central cord syndrome at unspecified level of cervical spinal cord, initial encounter: Secondary | ICD-10-CM

## 2024-04-21 DIAGNOSIS — Z951 Presence of aortocoronary bypass graft: Secondary | ICD-10-CM

## 2024-04-21 DIAGNOSIS — I959 Hypotension, unspecified: Secondary | ICD-10-CM | POA: Diagnosis not present

## 2024-04-21 DIAGNOSIS — I517 Cardiomegaly: Secondary | ICD-10-CM | POA: Diagnosis not present

## 2024-04-21 DIAGNOSIS — I10 Essential (primary) hypertension: Secondary | ICD-10-CM | POA: Diagnosis present

## 2024-04-21 DIAGNOSIS — S14157S Other incomplete lesion at C7 level of cervical spinal cord, sequela: Secondary | ICD-10-CM | POA: Diagnosis not present

## 2024-04-21 DIAGNOSIS — Z794 Long term (current) use of insulin: Secondary | ICD-10-CM | POA: Diagnosis not present

## 2024-04-21 DIAGNOSIS — R059 Cough, unspecified: Secondary | ICD-10-CM | POA: Diagnosis not present

## 2024-04-21 LAB — GLUCOSE, CAPILLARY
Glucose-Capillary: 154 mg/dL — ABNORMAL HIGH (ref 70–99)
Glucose-Capillary: 183 mg/dL — ABNORMAL HIGH (ref 70–99)
Glucose-Capillary: 208 mg/dL — ABNORMAL HIGH (ref 70–99)
Glucose-Capillary: 211 mg/dL — ABNORMAL HIGH (ref 70–99)

## 2024-04-21 MED ORDER — METHOCARBAMOL 500 MG PO TABS
1000.0000 mg | ORAL_TABLET | Freq: Three times a day (TID) | ORAL | Status: DC
  Administered 2024-04-21 – 2024-04-26 (×13): 1000 mg via ORAL
  Filled 2024-04-21 (×14): qty 2

## 2024-04-21 MED ORDER — EZETIMIBE 10 MG PO TABS
10.0000 mg | ORAL_TABLET | Freq: Every day | ORAL | Status: DC
  Administered 2024-04-22 – 2024-05-17 (×29): 10 mg via ORAL
  Filled 2024-04-21 (×26): qty 1

## 2024-04-21 MED ORDER — ROSUVASTATIN CALCIUM 20 MG PO TABS
40.0000 mg | ORAL_TABLET | Freq: Every day | ORAL | Status: DC
Start: 2024-04-21 — End: 2024-04-21
  Administered 2024-04-21: 40 mg via ORAL
  Filled 2024-04-21: qty 2

## 2024-04-21 MED ORDER — ONDANSETRON 4 MG PO TBDP
4.0000 mg | ORAL_TABLET | Freq: Four times a day (QID) | ORAL | Status: DC | PRN
  Administered 2024-04-28: 4 mg via ORAL
  Filled 2024-04-21 (×2): qty 1

## 2024-04-21 MED ORDER — TAMSULOSIN HCL 0.4 MG PO CAPS
0.4000 mg | ORAL_CAPSULE | Freq: Every day | ORAL | Status: DC
  Administered 2024-04-21 – 2024-04-23 (×3): 0.4 mg via ORAL
  Filled 2024-04-21 (×3): qty 1

## 2024-04-21 MED ORDER — DICLOFENAC SODIUM 1 % EX GEL
2.0000 g | Freq: Two times a day (BID) | CUTANEOUS | Status: DC | PRN
  Administered 2024-04-30 – 2024-05-09 (×6): 2 g via TOPICAL
  Filled 2024-04-21: qty 100

## 2024-04-21 MED ORDER — ACETAMINOPHEN 500 MG PO TABS
1000.0000 mg | ORAL_TABLET | Freq: Three times a day (TID) | ORAL | Status: DC
  Administered 2024-04-21 – 2024-05-02 (×38): 1000 mg via ORAL
  Filled 2024-04-21 (×43): qty 2

## 2024-04-21 MED ORDER — BENAZEPRIL HCL 20 MG PO TABS
20.0000 mg | ORAL_TABLET | Freq: Every day | ORAL | Status: DC
  Administered 2024-04-22 – 2024-04-26 (×5): 20 mg via ORAL
  Filled 2024-04-21 (×5): qty 1

## 2024-04-21 MED ORDER — INSULIN ASPART 100 UNIT/ML IJ SOLN
0.0000 [IU] | Freq: Three times a day (TID) | INTRAMUSCULAR | Status: DC
  Administered 2024-04-22: 3 [IU] via SUBCUTANEOUS

## 2024-04-21 MED ORDER — GABAPENTIN 300 MG PO CAPS
300.0000 mg | ORAL_CAPSULE | Freq: Three times a day (TID) | ORAL | Status: DC
  Administered 2024-04-21 – 2024-04-26 (×15): 300 mg via ORAL
  Filled 2024-04-21 (×16): qty 1

## 2024-04-21 MED ORDER — BENAZEPRIL HCL 20 MG PO TABS
20.0000 mg | ORAL_TABLET | Freq: Every day | ORAL | Status: DC
  Administered 2024-04-21: 20 mg via ORAL
  Filled 2024-04-21: qty 1

## 2024-04-21 MED ORDER — DICLOFENAC SODIUM 1 % EX GEL: 2.0000 g | Freq: Two times a day (BID) | CUTANEOUS | Status: DC | PRN

## 2024-04-21 MED ORDER — RANOLAZINE ER 500 MG PO TB12
500.0000 mg | ORAL_TABLET | Freq: Two times a day (BID) | ORAL | Status: DC
  Administered 2024-04-21 – 2024-05-17 (×57): 500 mg via ORAL
  Filled 2024-04-21 (×53): qty 1

## 2024-04-21 MED ORDER — ACETAMINOPHEN 500 MG PO TABS: 1000.0000 mg | ORAL_TABLET | Freq: Four times a day (QID) | ORAL | Status: DC

## 2024-04-21 MED ORDER — POLYETHYLENE GLYCOL 3350 17 G PO PACK
17.0000 g | PACK | Freq: Every day | ORAL | Status: DC
  Administered 2024-04-22 – 2024-05-17 (×24): 17 g via ORAL
  Filled 2024-04-21 (×26): qty 1

## 2024-04-21 MED ORDER — DOCUSATE SODIUM 100 MG PO CAPS
100.0000 mg | ORAL_CAPSULE | Freq: Two times a day (BID) | ORAL | Status: DC
  Administered 2024-04-21 – 2024-05-17 (×50): 100 mg via ORAL
  Filled 2024-04-21 (×52): qty 1

## 2024-04-21 MED ORDER — METHOCARBAMOL 500 MG PO TABS
1000.0000 mg | ORAL_TABLET | Freq: Three times a day (TID) | ORAL | Status: DC
  Administered 2024-04-21: 1000 mg via ORAL
  Filled 2024-04-21: qty 2

## 2024-04-21 MED ORDER — OXYCODONE HCL 5 MG PO TABS: 5.0000 mg | ORAL_TABLET | ORAL | Status: DC | PRN

## 2024-04-21 MED ORDER — ONDANSETRON HCL 4 MG/2ML IJ SOLN
4.0000 mg | Freq: Four times a day (QID) | INTRAMUSCULAR | Status: DC | PRN
  Filled 2024-04-21: qty 2

## 2024-04-21 MED ORDER — CARVEDILOL 3.125 MG PO TABS
3.1250 mg | ORAL_TABLET | Freq: Two times a day (BID) | ORAL | Status: DC
  Administered 2024-04-22 – 2024-04-26 (×9): 3.125 mg via ORAL
  Filled 2024-04-21 (×12): qty 1

## 2024-04-21 MED ORDER — LIDOCAINE HCL URETHRAL/MUCOSAL 2 % EX GEL
1.0000 | CUTANEOUS | Status: DC | PRN
  Administered 2024-04-29: 1 via URETHRAL
  Filled 2024-04-21: qty 6

## 2024-04-21 MED ORDER — ROSUVASTATIN CALCIUM 20 MG PO TABS
40.0000 mg | ORAL_TABLET | Freq: Every day | ORAL | Status: DC
  Administered 2024-04-22 – 2024-05-09 (×19): 40 mg via ORAL
  Filled 2024-04-21 (×19): qty 2

## 2024-04-21 MED ORDER — PANTOPRAZOLE SODIUM 40 MG PO TBEC
40.0000 mg | DELAYED_RELEASE_TABLET | Freq: Two times a day (BID) | ORAL | Status: DC
  Administered 2024-04-21 – 2024-05-17 (×57): 40 mg via ORAL
  Filled 2024-04-21 (×52): qty 1

## 2024-04-21 MED ORDER — TRAMADOL HCL 50 MG PO TABS
50.0000 mg | ORAL_TABLET | Freq: Four times a day (QID) | ORAL | Status: DC | PRN
  Administered 2024-04-22 – 2024-05-03 (×6): 50 mg via ORAL
  Filled 2024-04-21 (×10): qty 1

## 2024-04-21 NOTE — Progress Notes (Signed)
 Inpatient Rehab Admissions Coordinator:    I have insurance approval and a bed available for pt to admit to CIR today. Burnard Banter, PA-C, in agreement and [facility] aware.  I've notified pt/family and they are in agreement to admit today.  I will make arrangements.    Reche Lowers, PT, DPT Admissions Coordinator [phone] 04/21/24  3:08 PM

## 2024-04-21 NOTE — Progress Notes (Signed)
 Inpatient Rehab Admissions Coordinator:   Provided Dr. Jyl number to [facility] CM in case MD wants to do a peer to peer. Hopeful for decision today.   Reche Lowers, PT, DPT Admissions Coordinator [phone] 04/21/24  9:46 AM

## 2024-04-21 NOTE — Progress Notes (Signed)
    Providing Compassionate, Quality Care - Together   NEUROSURGERY PROGRESS NOTE     S: No issues overnight. C/o increased L shoulder pain today and increased dysphagia.    O: EXAM:  BP (!) 136/91 (BP Location: Left Arm)   Pulse 62   Temp (!) 97.5 F (36.4 C) (Oral)   Resp 14   Ht 5' 10 (1.778 m)   Wt 76.2 kg   SpO2 99%   BMI 24.11 kg/m     Awake, alert, oriented  Speech fluent, appropriate  3/5 grip strength, 4/5 proximally BLE strength grossly intact SILTx4 Incision c/d/I, dressing changed at bedside   ASSESSMENT:  73 y.o. with C6-7 central stenosis s/p ACDF     PLAN: -Continue therapies as tolerated -Continue soft collar  -Call w/ questions/concerns.   Camie Pickle, [facility]

## 2024-04-21 NOTE — TOC Transition Note (Signed)
 Transition of Care [facility]) - Discharge Note   Patient Details  Name: Cannan Beeck MRN: [id number] Date of Birth: 10/30/1950  [facility] CM/SW Contact:  Donnell Beauchamp E Maloree Uplinger, LCSW Phone Number: 04/21/2024, 4:09 PM   Clinical Narrative:    Patient is discharging to [facility].   Final next level of care: IP Rehab Facility Barriers to Discharge: Barriers Resolved   Patient Goals and CMS Choice            Discharge Placement                       Discharge Plan and Services Additional resources added to the After Visit Summary for                  DME Arranged: Lightweight manual wheelchair with seat cushion, Bedside commode, 3-N-1 DME Agency: [employer] Date DME Agency Contacted: 04/16/24 Time DME Agency Contacted: 1256 Representative spoke with at DME Agency: [facility] HH Arranged: PT, OT HH Agency: [facility] Date [facility] Agency Contacted: 04/16/24 Time HH Agency Contacted: 1248 Representative spoke with at [facility] Agency: [facility]  Social Drivers of Health (SDOH) Interventions SDOH Screenings   Food Insecurity: No Food Insecurity (04/16/2024)  Housing: Low Risk  (04/16/2024)  Transportation Needs: No Transportation Needs (04/16/2024)  Utilities: Not At Risk (04/18/2024)  Social Connections: Socially Integrated (04/16/2024)  Tobacco Use: Low Risk  (04/19/2024)     Readmission Risk Interventions     No data to display
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
## 2024-04-21 NOTE — Plan of Care (Signed)
   Problem: Education: Goal: Knowledge of General Education information will improve Description: Including pain rating scale, medication(s)/side effects and non-pharmacologic comfort measures Outcome: Progressing   Problem: Health Behavior/Discharge Planning: Goal: Ability to manage health-related needs will improve Outcome: Progressing   Problem: Clinical Measurements: Goal: Ability to maintain clinical measurements within normal limits will improve Outcome: Progressing Goal: Will remain free from infection Outcome: Progressing Goal: Diagnostic test results will improve Outcome: Progressing Goal: Respiratory complications will improve Outcome: Progressing Goal: Cardiovascular complication will be avoided Outcome: Progressing   Problem: Activity: Goal: Risk for activity intolerance will decrease Outcome: Progressing   Problem: Nutrition: Goal: Adequate nutrition will be maintained Outcome: Progressing   Problem: Coping: Goal: Level of anxiety will decrease Outcome: Progressing   Problem: Elimination: Goal: Will not experience complications related to bowel motility Outcome: Progressing Goal: Will not experience complications related to urinary retention Outcome: Progressing   Problem: Pain Managment: Goal: General experience of comfort will improve and/or be controlled Outcome: Progressing   Problem: Safety: Goal: Ability to remain free from injury will improve Outcome: Progressing   Problem: Skin Integrity: Goal: Risk for impaired skin integrity will decrease Outcome: Progressing   Problem: Education: Goal: Ability to describe self-care measures that may prevent or decrease complications (Diabetes Survival Skills Education) will improve Outcome: Progressing Goal: Individualized Educational Video(s) Outcome: Progressing   Problem: Coping: Goal: Ability to adjust to condition or change in health will improve Outcome: Progressing   Problem: Fluid  Volume: Goal: Ability to maintain a balanced intake and output will improve Outcome: Progressing   Problem: Health Behavior/Discharge Planning: Goal: Ability to identify and utilize available resources and services will improve Outcome: Progressing Goal: Ability to manage health-related needs will improve Outcome: Progressing   Problem: Metabolic: Goal: Ability to maintain appropriate glucose levels will improve Outcome: Progressing   Problem: Nutritional: Goal: Maintenance of adequate nutrition will improve Outcome: Progressing Goal: Progress toward achieving an optimal weight will improve Outcome: Progressing   Problem: Skin Integrity: Goal: Risk for impaired skin integrity will decrease Outcome: Progressing   Problem: Tissue Perfusion: Goal: Adequacy of tissue perfusion will improve Outcome: Progressing   Problem: Education: Goal: Ability to verbalize activity precautions or restrictions will improve Outcome: Progressing Goal: Knowledge of the prescribed therapeutic regimen will improve Outcome: Progressing Goal: Understanding of discharge needs will improve Outcome: Progressing   Problem: Activity: Goal: Ability to avoid complications of mobility impairment will improve Outcome: Progressing Goal: Ability to tolerate increased activity will improve Outcome: Progressing Goal: Will remain free from falls Outcome: Progressing   Problem: Bowel/Gastric: Goal: Gastrointestinal status for postoperative course will improve Outcome: Progressing   Problem: Clinical Measurements: Goal: Ability to maintain clinical measurements within normal limits will improve Outcome: Progressing Goal: Postoperative complications will be avoided or minimized Outcome: Progressing Goal: Diagnostic test results will improve Outcome: Progressing   Problem: Pain Management: Goal: Pain level will decrease Outcome: Progressing   Problem: Skin Integrity: Goal: Will show signs of wound  healing Outcome: Progressing   Problem: Health Behavior/Discharge Planning: Goal: Identification of resources available to assist in meeting health care needs will improve Outcome: Progressing   Problem: Bladder/Genitourinary: Goal: Urinary functional status for postoperative course will improve Outcome: Progressing

## 2024-04-21 NOTE — Plan of Care (Signed)
 Problem: Education: Goal: Knowledge of General Education information will improve Description: Including pain rating scale, medication(s)/side effects and non-pharmacologic comfort measures Outcome: Adequate for Discharge   Problem: Health Behavior/Discharge Planning: Goal: Ability to manage health-related needs will improve Outcome: Adequate for Discharge   Problem: Clinical Measurements: Goal: Ability to maintain clinical measurements within normal limits will improve Outcome: Adequate for Discharge Goal: Will remain free from infection Outcome: Adequate for Discharge Goal: Diagnostic test results will improve Outcome: Adequate for Discharge Goal: Respiratory complications will improve Outcome: Adequate for Discharge Goal: Cardiovascular complication will be avoided Outcome: Adequate for Discharge   Problem: Activity: Goal: Risk for activity intolerance will decrease Outcome: Adequate for Discharge   Problem: Nutrition: Goal: Adequate nutrition will be maintained Outcome: Adequate for Discharge   Problem: Coping: Goal: Level of anxiety will decrease Outcome: Adequate for Discharge   Problem: Elimination: Goal: Will not experience complications related to bowel motility Outcome: Adequate for Discharge Goal: Will not experience complications related to urinary retention Outcome: Adequate for Discharge   Problem: Pain Managment: Goal: General experience of comfort will improve and/or be controlled Outcome: Adequate for Discharge   Problem: Safety: Goal: Ability to remain free from injury will improve Outcome: Adequate for Discharge   Problem: Skin Integrity: Goal: Risk for impaired skin integrity will decrease Outcome: Adequate for Discharge   Problem: Education: Goal: Ability to describe self-care measures that may prevent or decrease complications (Diabetes Survival Skills Education) will improve Outcome: Adequate for Discharge Goal: Individualized Educational  Video(s) Outcome: Adequate for Discharge   Problem: Coping: Goal: Ability to adjust to condition or change in health will improve Outcome: Adequate for Discharge   Problem: Fluid Volume: Goal: Ability to maintain a balanced intake and output will improve Outcome: Adequate for Discharge   Problem: Health Behavior/Discharge Planning: Goal: Ability to identify and utilize available resources and services will improve Outcome: Adequate for Discharge Goal: Ability to manage health-related needs will improve Outcome: Adequate for Discharge   Problem: Metabolic: Goal: Ability to maintain appropriate glucose levels will improve Outcome: Adequate for Discharge   Problem: Nutritional: Goal: Maintenance of adequate nutrition will improve Outcome: Adequate for Discharge Goal: Progress toward achieving an optimal weight will improve Outcome: Adequate for Discharge   Problem: Skin Integrity: Goal: Risk for impaired skin integrity will decrease Outcome: Adequate for Discharge   Problem: Tissue Perfusion: Goal: Adequacy of tissue perfusion will improve Outcome: Adequate for Discharge   Problem: Education: Goal: Ability to verbalize activity precautions or restrictions will improve Outcome: Adequate for Discharge Goal: Knowledge of the prescribed therapeutic regimen will improve Outcome: Adequate for Discharge Goal: Understanding of discharge needs will improve Outcome: Adequate for Discharge   Problem: Activity: Goal: Ability to avoid complications of mobility impairment will improve Outcome: Adequate for Discharge Goal: Ability to tolerate increased activity will improve Outcome: Adequate for Discharge Goal: Will remain free from falls Outcome: Adequate for Discharge   Problem: Bowel/Gastric: Goal: Gastrointestinal status for postoperative course will improve Outcome: Adequate for Discharge   Problem: Clinical Measurements: Goal: Ability to maintain clinical measurements  within normal limits will improve Outcome: Adequate for Discharge Goal: Postoperative complications will be avoided or minimized Outcome: Adequate for Discharge Goal: Diagnostic test results will improve Outcome: Adequate for Discharge   Problem: Pain Management: Goal: Pain level will decrease Outcome: Adequate for Discharge   Problem: Skin Integrity: Goal: Will show signs of wound healing Outcome: Adequate for Discharge   Problem: Health Behavior/Discharge Planning: Goal: Identification of resources available to assist in  meeting health care needs will improve Outcome: Adequate for Discharge   Problem: Bladder/Genitourinary: Goal: Urinary functional status for postoperative course will improve Outcome: Adequate for Discharge

## 2024-04-21 NOTE — Discharge Summary (Signed)
    Patient IDBricen Victory Vaughn [id number] [birth year] 73 y.o.  Admit date: 04/15/2024 Discharge date: 04/21/2024  Admitting Diagnosis: 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 Debby, NSGY  Reason for Admission: 36M was on his roof, cutting up a tree branch that had fallen through the roof when the tree branch fell on him. He denies falling off the roof. SBP 80s en route for EMS. Rec'd 2u pRBC and 2FFP in ED with persistent hypotension. FAST negative, CXR/PXR without source of blood loss. Levophed started.   Procedures Dr. Dorn Debby, 04/19/2024 1. Arthrodesis C6-7, anterior interbody technique, including Discectomy for decompression of spinal cord and exiting nerve roots with foraminotomies  2. Placement of intervertebral biomechanical device C6-7 3. Placement of anterior instrumentation consisting of interbody plate and screws C6-7 4. Use of morselized bone allograft  5. Use of intraoperative microscope  Hospital Course:  Fall from roof after he was hit with a tree branch  Shock Transfused 2+2 (pRBCs and FFP) 7/18, hgb 11.7 from 11.6, initially required levophed, but able to be weaned off.  Likely neurogenic in nature, resolved.  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 Debby.  Continue therapies.  Soft collar in place.  DM  on Ozempic  at home.  This was held in the hospital due to surgery and risk of gastroparesis, last dose last Friday.  SSI  CAD/HTN  Reordered Coreg , Benazepril  restarted 7/24  HLD  Zetia  reordered  Urinary retention  Foley in place likely secondary to central cord syndrome, TOV following surgery in  inpatient rehab.  Thrombocytopenia Stable between 94-100K during his admission   Medications: Per CIR upon arrival     Follow-up Information     [facility] Follow up.   Specialty: Home Health Services Why: Physical and Occupational therapy. Office will call to arrange follow up after hospital discharge. Contact information: [address] [place] [address] [phone]         [name] MATSU, MD. Call in 2 week(s).   Specialty: Neurosurgery Contact information: [address] [place] [address] [phone]                 Signed: Burnard Banter, [facility] 04/21/2024, 3:40 PM Please see Amion for pager number during [place] hours 7:00am-4:30pm, 7-11:30am on Weekends

## 2024-04-21 NOTE — Evaluation (Signed)
 Occupational Therapy Assessment and Plan  Patient Details  Name: Jerome Vaughn MRN: [id number] Date of Birth: 09/04/1951  OT Diagnosis: {diagnoses:3041644} Rehab Potential:   ELOS:     {CHL IP REHAB OT TIME CALCULATIONS:304400400}    Hospital Problem: Principal Problem:   Acute incomplete quadriplegia (HCC)   Past Medical History:  Past Medical History:  Diagnosis Date   Carpal tunnel syndrome    both hands   Colon polyps    Coronary artery disease    sees Dr. [name]   Diabetes mellitus    sees Dr. [name]   Diabetes mellitus [facility]) 2009   Ganglion cyst of wrist, right 1989   GERD (gastroesophageal reflux disease)    History of echocardiogram    a. Echo 4/17: Moderate LVH, EF 50-55%, mild LAE, no pericardial effusion   Hyperlipidemia    Hypertension    MGUS (monoclonal gammopathy of unknown significance)    sees Dr. [name]   Neuropathy    gets foot exams with Dr. [name] (podiatry)   Pneumonia    Rotator cuff injury    Past Surgical History:  Past Surgical History:  Procedure Laterality Date   ANTERIOR CERVICAL DECOMP/DISCECTOMY FUSION N/A 04/19/2024   Procedure: ANTERIOR CERVICAL DECOMPRESSION/DISCECTOMY FUSION CERVICAL SIX-SEVEN;  Surgeon: [name] MATSU, MD;  Location[facility] OR;  Service: Neurosurgery;  Laterality: N/A;  ACDF C67   CARDIAC CATHETERIZATION N/A 11/21/2015   Procedure: Left Heart Cath and Coronary Angiography;  Surgeon: [name], MD;  Location: [facility] INVASIVE CV LAB;  Service: Cardiovascular;  Laterality: N/A;   COLONOSCOPY  03/18/2024   per Dr. [name], adenomatous polyp, repeat in 5 yrs   CORONARY ARTERY BYPASS GRAFT N/A 11/22/2015   Procedure: CORONARY ARTERY BYPASS GRAFTING (CABG) times five using the left internal mammary, right greater saphenous vein EVH, and left thigh greater saphenous vein EVH;  Surgeon: [name], MD;  Location: [facility] OR;  Service: Open Heart Surgery;  Laterality: N/A;   CORONARY ARTERY BYPASS GRAFT   2017   CORONARY PRESSURE/FFR STUDY N/A 12/14/2018   Procedure: INTRAVASCULAR PRESSURE WIRE/FFR STUDY;  Surgeon: [name], MD;  Location: [facility] INVASIVE CV LAB;  Service: Cardiovascular;  Laterality: N/A;   ESOPHAGOGASTRODUODENOSCOPY  03/18/2024   per Dr. [name], normal   frozen shoulder release Right    GANGLION CYST EXCISION Right 1989   LEFT HEART CATH AND CORS/GRAFTS ANGIOGRAPHY N/A 10/27/2017   Procedure: LEFT HEART CATH AND CORS/GRAFTS ANGIOGRAPHY;  Surgeon: Sharps [name], MD;  Location: [name];  Service: Cardiovascular;  Laterality: N/A;   LEFT HEART CATH AND CORS/GRAFTS ANGIOGRAPHY N/A 12/14/2018   Procedure: LEFT HEART CATH AND CORS/GRAFTS ANGIOGRAPHY;  Surgeon: [name], MD;  Location: [facility] INVASIVE CV LAB;  Service: Cardiovascular;  Laterality: N/A;   TEE WITHOUT CARDIOVERSION N/A 11/22/2015   Procedure: TRANSESOPHAGEAL ECHOCARDIOGRAM (TEE);  Surgeon: [name], MD;  Location: [facility] OR;  Service: Open Heart Surgery;  Laterality: N/A;   WRIST GANGLION EXCISION Right     Assessment & Plan Clinical Impression: Patient is a 73 year old right-handed male with history of diabetes mellitus, CAD with CABG 2017, hypertension, hyperlipidemia. Presented 04/15/2024 after being on a roof cutting a tree branch when the tree fell on him pinning him against the roof. No loss of conscious. He was found to be in shock and transfused 2+2, started on Levophed which was quickly weaned off. Cranial CT scan showed slight frontal scalp edema. No acute intracranial abnormality. No skull  fracture. CT cervical spine widening of the left C3-4 facet, but no jumped or perched facets. No acute fracture of the cervical spine. MRI cervical spine showed focal hyperintense T2 weighted signal within the spinal cord at C3-4 levels and at C7 indicating possible myelomalacia or spinal cord edema. 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 Mild spinal canal  stenosis and severe bilateral neural foraminal stenosis C3-4 CT of the chest abdomen pelvis unremarkable. CT maxillofacial showed soft tissue edema in the right cheek no acute facial bone fracture. Admission chemistries unremarkable aside glucose 177 creatinine 1.43 alkaline phosphatase 24, WBC 2.6, hemoglobin A1c 6.4, alcohol negative. Underwent arthrodesis C6-7 anterior interbody technique including discectomy for decompression of spinal cord and exiting nerve roots with foraminotomies. Placement of intervertebral biomechanical device C6-7 04/19/2024 per Dr. Dorn Ned. Placed in a soft cervical collar at all times. Tolerating a regular consistency diet.   Patient transferred to CIR on 04/21/2024 .    Patient currently requires {ONJ:6958369} with {JIO:6958368} secondary to {pfejpmfzwud:6958367}.  Prior to hospitalization, patient could complete *** with {ONJ:6958369}.  Patient will benefit from skilled intervention to {benefit of skilled intervention:3041641} prior to discharge {discharge:3041642}.  Anticipate patient will require {supervision/assistance:22779} and {follow le:6958356}.      OT Evaluation Precautions/Restrictions  Restrictions Weight Bearing Restrictions Per Provider Order: No  Pain Pain Assessment Pain Scale: 0-10 Pain Score: 0-No pain Home Living/Prior Functioning Home Living Family/patient expects to be discharged to:: Private residence Living Arrangements: Spouse/significant other, Children Vision   Perception    Praxis   Cognition   Sensation   Motor     Trunk/Postural Assessment     Balance   Extremity/Trunk Assessment      Care Tool Care Tool Self Care Eating        Oral Care         Bathing              Upper Body Dressing(including orthotics)            Lower Body Dressing (excluding footwear)          Putting on/Taking off footwear             Care Tool Toileting Toileting activity         Care Tool Bed  Mobility Roll left and right activity        Sit to lying activity        Lying to sitting on side of bed activity         Care Tool Transfers Sit to stand transfer        Chair/bed transfer         Toilet transfer         Care Tool Cognition  Expression of Ideas and Wants    Understanding Verbal and Non-Verbal Content     Memory/Recall Ability     Refer to Care Plan for Long Term Goals  SHORT TERM GOAL WEEK 1    Recommendations for other services: {RECOMMENDATIONS FOR OTHER SERVICES:3049016}   Skilled Therapeutic Intervention ADL   Mobility    Skilled Intervention  Discharge Criteria: Patient will be discharged from OT if patient refuses treatment 3 consecutive times without medical reason, if treatment goals not met, if there is a change in medical status, if patient makes no progress towards goals or if patient is discharged from hospital.  The above assessment, treatment plan, treatment alternatives and goals were discussed and mutually agreed upon: {Assessment/Treatment Plan Discussed/Agreed:3049017}  Leita  Davide Risdon, OTR/L,CBIS  Supplemental OT - [facility] and [facility] Secure Chat Preferred   04/21/2024, 7:12 PM

## 2024-04-21 NOTE — Progress Notes (Signed)
 Cornelio Bouchard, MD  Physician Physical Medicine and Rehabilitation   PMR Pre-admission     Signed   Date of Service: 04/20/2024 12:12 PM   Signed     Expand All Collapse All  PMR Admission Coordinator Pre-Admission Assessment   Patient: Jerome Vaughn is an 73 y.o., male MRN: [id number] DOB: Mar 13, 1951 Height: 5' 10 (177.8 cm) Weight: 76.2 kg                                                                                                                                                  Insurance Information HMO: yes    PPO:      PCP:      IPA:      80/20:      OTHER: [employer] HMO      Policy#: [id number]      Subscriber: pt CM Name: Luke HERO      Phone#: [phone] ext [phone]     Fax#: [phone] Pre-Cert#: [id number] auth for CIR provided by Luke with [facility] Medicare with updates due to Norvel SQUIBB (ext [phone]) at fax listed above on 7/30      Employer:  Benefits:  Phone #: [phone]     Name:  Eff. Date: 09/30/23     Deduct: $300 ($0 met)      Out of Pocket Max: $9350 (met $172.48)      Life Max: n/a  CIR: $399/[place] for days 1-6 (max $2394)      SNF: 20 full days Outpatient:      Co-Pay: $25/visit Home Health: 100%      Co-Pay:  DME: 88%     Co-Pay: 12% Providers:  SECONDARY:       Policy#:       Phone#:    Artist:       Phone#:    The Engineer, materials Information Summary" for patients in Inpatient Rehabilitation Facilities with attached "Privacy Act Statement-Health Care Records" was provided and verbally reviewed with: Patient and Family   Emergency Contact Information Contact Information       Name Relation Home Work Mobile    [facility] Spouse     [phone]    Olmsted,[name] Daughter     [phone]    Johnwilliam, Shepperson     [phone]         Other Contacts   None on File      Current Medical History  Patient Admitting Diagnosis: central cord syndrome    History of Present Illness: Jerome Vaughn is a 73 year old right-handed male  with PMH of diabetes mellitus, CAD with CABG 2017,  hypertension, hyperlipidemia who presented to [facility] on 04/15/2024 after being on a roof cutting a tree branch when the tree fell on him pinning him against the roof.  No loss of conscious.  He was found to be  in shock and transfused 2+2, started on Levophed which was quickly weaned off.  Cranial CT scan showed slight frontal scalp edema.  No acute intracranial abnormality.  No skull fracture.  CT cervical spine widening of the left C3-4 facet, but no jumped or perched facets.  No acute fracture of the cervical spine.  MRI cervical spine showed focal hyperintense T2 weighted signal within the spinal cord at C3-4 levels and at C7 indicating possible myelomalacia or spinal cord edema.  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  Mild spinal canal stenosis and severe bilateral neural foraminal stenosis C3-4 CT of the chest abdomen pelvis unremarkable.  CT maxillofacial showed soft tissue edema in the right cheek no acute facial bone fracture.  Admission chemistries unremarkable aside glucose 177 creatinine 1.43 alkaline phosphatase 24, WBC 2.6, hemoglobin A1c 6.4, alcohol negative.  Underwent arthrodesis C6-7 anterior interbody technique including discectomy for decompression of spinal cord and exiting nerve roots with foraminotomies.  Placement of intervertebral biomechanical device C6-7 04/19/2024 per Dr. Dorn Ned.  Placed in a soft cervical collar at all times.  Tolerating a regular consistency diet.  Therapy evaluations completed and pt was recommended for a comprehensive rehab program.  Glasgow Coma Scale Score: 15   Patient's medical record from [facility] has been reviewed by the rehabilitation admission coordinator and physician.   Past Medical History      Past Medical History:  Diagnosis Date   Diabetes mellitus (HCC) 2009   Ganglion cyst of wrist, right 1989   Hypertension            Has the patient had  major surgery during 100 days prior to admission? Yes   Family History  family history is not on file.     Current Medications   Current Medications    Current Facility-Administered Medications:    0.9 %  sodium chloride  infusion, 250 mL, Intravenous, Continuous, Johnanna Credit Caylin, PA-C, Last Rate: 1 mL/hr at 04/19/24 1829, 250 mL at 04/19/24 1829   acetaminophen  (TYLENOL ) tablet 1,000 mg, 1,000 mg, Oral, Q6H, Lovick, Dreama SAILOR, MD, 1,000 mg at 04/20/24 1205   carvedilol  (COREG ) tablet 3.125 mg, 3.125 mg, Oral, BID WC, Simaan, Elizabeth S, PA-C, 3.125 mg at 04/20/24 9182   Chlorhexidine  Gluconate Cloth 2 % PADS 6 each, 6 each, Topical, Daily, Simaan, Elizabeth S, PA-C, 6 each at 04/20/24 1205   docusate sodium  (COLACE) capsule 100 mg, 100 mg, Oral, BID, Lovick, Dreama SAILOR, MD, 100 mg at 04/20/24 0816   ezetimibe  (ZETIA ) tablet 10 mg, 10 mg, Oral, Daily, Simaan, Elizabeth S, PA-C, 10 mg at 04/20/24 9182   gabapentin  (NEURONTIN ) capsule 300 mg, 300 mg, Oral, TID, Signe Cree A, MD, 300 mg at 04/20/24 9182   hydrALAZINE (APRESOLINE) injection 10 mg, 10 mg, Intravenous, Q2H PRN, Paola Dreama SAILOR, MD   HYDROmorphone (DILAUDID) injection 0.5 mg, 0.5 mg, Intravenous, Q2H PRN, Signe Cree LABOR, MD   insulin  aspart (novoLOG ) injection 0-15 Units, 0-15 Units, Subcutaneous, TID WC, Signe Cree LABOR, MD, 3 Units at 04/20/24 1207   insulin  aspart (novoLOG ) injection 0-5 Units, 0-5 Units, Subcutaneous, QHS, Signe Cree A, MD, 2 Units at 04/19/24 2132   menthol-cetylpyridinium (CEPACOL) lozenge 3 mg, 1 lozenge, Oral, PRN **OR** phenol (CHLORASEPTIC) mouth spray 1 spray, 1 spray, Mouth/Throat, PRN, Johnanna Credit Caylin, PA-C   methocarbamol  (ROBAXIN ) tablet 500 mg, 500 mg, Oral, TID, Tammy Sor, PA-C, 500 mg at 04/20/24 1204   metoprolol  tartrate (LOPRESSOR ) injection 5 mg, 5 mg,  Intravenous, Q6H PRN, Paola Dreama SAILOR, MD   ondansetron  (ZOFRAN -ODT) disintegrating tablet 4 mg, 4 mg, Oral,  Q6H PRN **OR** ondansetron  (ZOFRAN ) injection 4 mg, 4 mg, Intravenous, Q6H PRN, Paola Dreama SAILOR, MD   Oral care mouth rinse, 15 mL, Mouth Rinse, PRN, Tammy Sor, PA-C   oxyCODONE  (Oxy IR/ROXICODONE ) immediate release tablet 5-10 mg, 5-10 mg, Oral, Q4H PRN, Paola Dreama SAILOR, MD, 10 mg at 04/17/24 0857   pantoprazole  (PROTONIX ) EC tablet 40 mg, 40 mg, Oral, BID, Simaan, Elizabeth S, PA-C, 40 mg at 04/20/24 0818   polyethylene glycol (MIRALAX  / GLYCOLAX ) packet 17 g, 17 g, Oral, Daily, Tammy Sor, PA-C, 17 g at 04/20/24 0815   ranolazine  (RANEXA ) 12 hr tablet 500 mg, 500 mg, Oral, BID, Simaan, Elizabeth S, PA-C, 500 mg at 04/20/24 9182   sodium chloride  flush (NS) 0.9 % injection 3 mL, 3 mL, Intravenous, Q12H, Tomlinson, Sara Caylin, PA-C   sodium chloride  flush (NS) 0.9 % injection 3 mL, 3 mL, Intravenous, PRN, Tomlinson, Sara Caylin, PA-C     Patients Current Diet:  Diet Order                  Diet Carb Modified Fluid consistency: Thin; Room service appropriate? Yes  Diet effective now                         Precautions / Restrictions Precautions Precautions: Fall Cervical Brace: Soft collar, At all times Restrictions Weight Bearing Restrictions Per Provider Order: No    Has the patient had 2 or more falls or a fall with injury in the past year?Yes   Prior Activity Level Community (5-7x/wk): independent prior to admit, working, driving   Prior Functional Level Prior Function Prior Level of Function : Independent/Modified Independent, Driving, Working/employed Mobility Comments: Ambulates without AD. Pt reports occasionally wearing Rt knee brace. 1 fall leading to current admission. ADLs Comments: Indep with ADLs/IADLs. Pt reports some difficulty with buttoning shirts and needs assist with dressing occasionally. Work full-time as a Lobbyist. Completes yard work. Wife would help with buttons and shoes at times, R hand numbness.   Self Care: Did the patient need  help bathing, dressing, using the toilet or eating?  Independent   Indoor Mobility: Did the patient need assistance with walking from room to room (with or without device)? Independent   Stairs: Did the patient need assistance with internal or external stairs (with or without device)? Independent   Functional Cognition: Did the patient need help planning regular tasks such as shopping or remembering to take medications? Independent   Patient Information Are you of Hispanic, Latino/a,or Spanish origin?: A. No, not of Hispanic, Latino/a, or Spanish origin What is your race?: B. Black or African American Do you need or want an interpreter to communicate with a doctor or health care staff?: 0. No   Patient's Response To:  Health Literacy and Transportation Is the patient able to respond to health literacy and transportation needs?: Yes Health Literacy - How often do you need to have someone help you when you read instructions, pamphlets, or other written material from your doctor or pharmacy?: Never In the past 12 months, has lack of transportation kept you from medical appointments or from getting medications?: No In the past 12 months, has lack of transportation kept you from meetings, work, or from getting things needed for daily living?: No   Journalist, newspaper / Equipment Home Equipment: Cane - single  point, Shower seat - built in   Prior Device Use: Indicate devices/aids used by the patient prior to current illness, exacerbation or injury? None of the above   Current Functional Level Cognition   Overall Cognitive Status: Within Functional Limits for tasks assessed Orientation Level: Oriented X4    Extremity Assessment (includes Sensation/Coordination)   Upper Extremity Assessment: Defer to OT evaluation RUE Deficits / Details: Able to partially extend fingers. Very weak gross grasp noted. No active wrist movement demonstrated. Able to demonstrate elbow flexion/extension  actively [facility] although increased effort needed with 3/5 strength. Shoulder flexion/abduction 3-/5 demonstrating ~25-30% A/ROM. RUE: Shoulder pain with ROM RUE Sensation: history of peripheral neuropathy RUE Coordination: decreased fine motor, decreased gross motor LUE Deficits / Details: demonstrating more hand function in left hand versus right. Able to open and close fingers into loose gross grasp. No active wrist movement demonstrated. Able to demonstrate elbow flexion/extension WFL at 3/5 MMT. shoulder flexion/abduction 3-/5 while demonstrating ~40-50% A/ROM. LUE: Shoulder pain with ROM LUE Sensation: history of peripheral neuropathy LUE Coordination: decreased fine motor, decreased gross motor  Lower Extremity Assessment: Overall WFL for tasks assessed     ADLs   Overall ADL's : Needs assistance/impaired Eating/Feeding: Maximal assistance, Sitting, Bed level Grooming: Maximal assistance, Sitting, Bed level Upper Body Bathing: Maximal assistance, Sitting, Bed level Lower Body Bathing: Maximal assistance, Sitting/lateral leans Upper Body Dressing : Maximal assistance, Sitting, Bed level Lower Body Dressing: Maximal assistance, Sitting/lateral leans, Sit to/from stand Toilet Transfer: Minimal assistance, Moderate assistance, Rolling walker (2 wheels), BSC/3in1 Toileting- Clothing Manipulation and Hygiene: Maximal assistance, Sitting/lateral lean, Sit to/from stand General ADL Comments: Pt max for ADLs as he does not have functional grip or FM skills. Pt min/mod A x2 assist for transfer to [facility], not able to hold RW safely     Mobility   Overal bed mobility: Needs Assistance Bed Mobility: Supine to Sit Supine to sit: HOB elevated, Min assist, Mod assist Sit to supine: Min assist General bed mobility comments: Not assessed. Pt greeted seated in recliner chair and returned there at end of session.     Transfers   Overall transfer level: Needs assistance Equipment used: 1 person hand held  assist Transfers: Sit to/from Stand, Bed to chair/wheelchair/BSC Sit to Stand: Min assist Bed to/from chair/wheelchair/BSC transfer type:: Step pivot Step pivot transfers: Min assist, Mod assist General transfer comment: Pt stood from lowest bed height. He powered up with minA and HHA provided to RUE. Pt transferred to recliner chair on his left. Good eccentric control.     Ambulation / Gait / Stairs / Wheelchair Mobility   Ambulation/Gait Ambulation/Gait assistance: Min assist, Mod assist Gait Distance (Feet): 2 Feet Assistive device: 1 person hand held assist Gait Pattern/deviations: Step-to pattern, Decreased step length - right, Decreased step length - left, Decreased stride length, Knees buckling General Gait Details: Pt took a couple of steps while he was pivoting to recliner chair on his left. Intermittent knee buckling observered requiring modA to stabilize and correct. Cues for sequencing.     Posture / Balance Dynamic Sitting Balance Sitting balance - Comments: Pt sat EOB with supervision. Balance Overall balance assessment: Needs assistance Sitting-balance support: No upper extremity supported, Feet supported Sitting balance-Leahy Scale: Fair Sitting balance - Comments: Pt sat EOB with supervision. Standing balance support: Single extremity supported, During functional activity Standing balance-Leahy Scale: Poor Standing balance comment: Pt dependent on external support and knees buckled requiring min-modA to correct.     Special needs/care consideration  Skin surgical incision to neck, soft collar at all times and Diabetic management yes        Previous Home Environment (from acute therapy documentation) Living Arrangements: Spouse/significant other, Children  Lives With: Spouse, Family Available Help at Discharge: Family, Available 24 hours/[place], Available PRN/intermittently (Wife can provide 24/7 supervision, but is unable to physically assist. Granddaughter works as Lawyer  at an ALF and is there at times.) Type of Home: House Home Layout: One level Home Access: Stairs to enter Entrance Stairs-Rails: None Secretary/administrator of Steps: 3 Bathroom Shower/Tub: Health visitor: Standard Home Care Services: No Additional Comments: Pt lives with wife who is available 24/7, granddaughter works as Lawyer at an ALF and is there at times.   Discharge Living Setting Plans for Discharge Living Setting: Patient's home, Lives with (comment) (spouse) Type of Home at Discharge: House Discharge Home Layout: One level Discharge Home Access: Stairs to enter Entrance Stairs-Rails: None Entrance Stairs-Number of Steps: 3 Discharge Bathroom Shower/Tub: Walk-in shower Discharge Bathroom Toilet: Standard Discharge Bathroom Accessibility: Yes How Accessible: Accessible via walker Does the patient have any problems obtaining your medications?: No   Social/Family/Support Systems Patient Roles: Spouse Anticipated Caregiver: spouse, Roxxane Swayne Anticipated Caregiver's Contact Information: [phone] Ability/Limitations of Caregiver: none stated Caregiver Availability: 24/7 Discharge Plan Discussed with Primary Caregiver: Yes Is Caregiver In Agreement with Plan?: Yes Does Caregiver/Family have Issues with Lodging/Transportation while Pt is in Rehab?: No     Goals Patient/Family Goal for Rehab: PT/OT supervision to min assist, SLP n/a Expected length of stay: 8-12 days Additional Information: Discharge plan: home with spouse who can provide 24/7 supervision to min assist with ADLs if needed Pt/Family Agrees to Admission and willing to participate: Yes Program Orientation Provided & Reviewed with Pt/Caregiver Including Roles  & Responsibilities: Yes Pt will actually probably need ~ 3 weeks due to central cord syndrome   Decrease burden of Care through IP rehab admission: n/a     Possible need for SNF placement upon discharge:Not anticipated.  Plan for  discharge home with spouse who can provide 24/7 supervision and up to min assist with ADL tasks.      Patient Condition: This patient's condition remains as documented in the consult dated 04/19/24, in which the Rehabilitation Physician determined and documented that the patient's condition is appropriate for intensive rehabilitative care in an inpatient rehabilitation facility. Will admit to inpatient rehab today.   Preadmission Screen Completed By:  Reche FORBES Lowers, PT, DPT 04/20/2024 12:12 PM ______________________________________________________________________   Discussed status with Dr. Lovorn on 04/21/24 at  3:14 PM  and received approval for admission today.   Admission Coordinator:  Candon Caras E Denessa Cavan, PT, DPT time 3:14 PM Pattricia 04/21/24              Revision History

## 2024-04-21 NOTE — Progress Notes (Addendum)
 2 Days Post-Op  Subjective: Pain well controlled.  Up in a chair right now awaiting to eat breakfast.  Had another BM yesterday.  Having some pain in his upper shoulders and posterior neck today.  ROS: See above, otherwise other systems negative  Objective: Vital signs in last 24 hours: Temp:  [97.5 F (36.4 C)-98.3 F (36.8 C)] 97.5 F (36.4 C) (07/24 0731) Pulse Rate:  [56-66] 62 (07/24 0731) Resp:  [14-17] 14 (07/24 0731) BP: (136-171)/(83-98) 136/91 (07/24 0731) SpO2:  [97 %-100 %] 99 % (07/24 0731) Last BM Date : 04/20/24  Intake/Output from previous [place]: 07/23 0701 - 07/24 0700 In: 600 [P.O.:600] Out: 3600 [Urine:3600] Intake/Output this shift: No intake/output data recorded.  PE: Gen: NAD HEENT: neck with soft collar in place Heart: regular Lungs: CTAB Abd: soft, NT GU: foley in place with yellow urine Ext: calves soft Neuro: weakness still in upper extremities.  Brace on right upper extremity to help with feeding.  No edema in LEs Psych: A&Ox3  Lab Results:  Recent Labs    04/19/24 0647 04/20/24 0523  WBC 5.0 6.0  HGB 12.7* 12.6*  HCT 38.0* 37.3*  PLT 94* 97*   BMET Recent Labs    04/19/24 0647  NA 132*  K 3.9  CL 101  CO2 23  GLUCOSE 134*  BUN 14  CREATININE 1.00  CALCIUM  9.2   PT/INR No results for input(s): LABPROT, INR in the last 72 hours.  CMP     Component Value Date/Time   NA 132 (L) 04/19/2024 0647   K 3.9 04/19/2024 0647   CL 101 04/19/2024 0647   CO2 23 04/19/2024 0647   GLUCOSE 134 (H) 04/19/2024 0647   BUN 14 04/19/2024 0647   CREATININE 1.00 04/19/2024 0647   CALCIUM  9.2 04/19/2024 0647   PROT 5.8 (L) 04/15/2024 1647   ALBUMIN  3.0 (L) 04/15/2024 1647   AST 28 04/15/2024 1647   ALT 27 04/15/2024 1647   ALKPHOS 24 (L) 04/15/2024 1647   BILITOT 0.7 04/15/2024 1647   GFRNONAA >60 04/19/2024 0647   Lipase  No results found for: LIPASE     Studies/Results: DG Cervical Spine 1 View Result Date:  04/19/2024 CLINICAL DATA:  [id number] Surgery, elective [id number]; surgery EXAM: DG CERVICAL SPINE - 1 VIEW; DG C-ARM 1-60 MIN-NO REPORT COMPARISON:  MRI cervical spine 04/16/2024 FINDINGS: Intraoperative anterior cervical discectomy and fusion of the C6-C7 level. 3 low resolution intraoperative spot views of the cervical spine were obtained. No fracture visible on the limited views. Endotracheal tube partially visualized. Total fluoroscopy time: 17.3 second Total radiation dose: 3.13 mGy IMPRESSION: Intraoperative anterior cervical discectomy and fusion of the C6-C7 level. Electronically Signed   By: Morgane  Naveau M.D.   On: 04/19/2024 16:47   DG C-Arm 1-60 Min-No Report Result Date: 04/19/2024 Fluoroscopy was utilized by the requesting physician.  No radiographic interpretation.   DG C-Arm 1-60 Min-No Report Result Date: 04/19/2024 Fluoroscopy was utilized by the requesting physician.  No radiographic interpretation.     Anti-infectives: Anti-infectives (From admission, onward)    Start     Dose/Rate Route Frequency Ordered Stop   04/19/24 1830  ceFAZolin  (ANCEF ) IVPB 2g/100 mL premix        2 g 200 mL/hr over 30 Minutes Intravenous Every 6 hours 04/19/24 1741 04/20/24 0019   04/19/24 1500  ceFAZolin  (ANCEF ) IVPB 2g/100 mL premix        2 g 200 mL/hr over 30 Minutes Intravenous 30 min pre-op  04/19/24 1255 04/19/24 1427        Assessment/Plan Fall from roof after he was hit with a tree branch Shock - transfused 2+2 7/18, hgb 11.7 from 11.6, initially required levophed, but able to be weaned off.  Likely neurogenic in nature, resolved. Widening of the left C3-C4 facet with central cord syndrome - ACDF of C6-7 level on 7/22, Dr. Debby.  Continue therapies.  Soft collar in place. DM - on Ozempic , last dose last Friday.  SSI CAD/HTN - reordered Coreg , Benazepril  restarted 7/24 HLD - Zetia  reordered Urinary retention - foley in place, TOV following surgery  Thrombocytopenia - plts 97K,  stable FEN - carb mod VTE - lovenox  held post op ID - none currently warranted, ancef  preop Dispo - CIR, auth submitted, medically stable when bed available  I reviewed nursing notes, Consultant NSGY notes, last 24 h vitals and pain scores, last 48 h intake and output, last 24 h labs and trends, and last 24 h imaging results.   LOS: 3 days    Jerome Vaughn , [facility] Surgery 04/21/2024, 8:38 AM Please see Amion for pager number during [place] hours 7:00am-4:30pm or 7:00am -11:30am on weekends
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
## 2024-04-21 NOTE — TOC Progression Note (Signed)
 Transition of Care [facility]) - Progression Note    Patient Details  Name: Jerome Vaughn MRN: [id number] Date of Birth: [birth year]  Transition of [facility] CM/SW Contact  Benny Deutschman E Tayvia Faughnan, LCSW Phone Number: 04/21/2024, 2:56 PM  Clinical Narrative:    Patient reviewed in rounds. Awaiting insurance authorization for CIR.   Expected Discharge Plan: IP Rehab Facility Barriers to Discharge: Continued Medical Work up               Expected Discharge Plan and Services         Expected Discharge Date: 04/16/24               DME Arranged: Community education officer wheelchair with seat cushion, Bedside commode, 3-N-1 DME Agency: [employer] Date DME Agency Contacted: 04/16/24 Time DME Agency Contacted: [phone] Representative spoke with at DME Agency: [facility] HH Arranged: PT, OT HH Agency: [facility] Date [facility] Agency Contacted: 04/16/24 Time HH Agency Contacted: [phone] Representative spoke with at [facility] Agency: [place]   Social Drivers of Health (SDOH) Interventions SDOH Screenings   Food Insecurity: No Food Insecurity (04/16/2024)  Housing: Low Risk  (04/16/2024)  Transportation Needs: No Transportation Needs (04/16/2024)  Utilities: Not At Risk (04/18/2024)  Social Connections: Socially Integrated (04/16/2024)  Tobacco Use: Low Risk  (04/19/2024)    Readmission Risk Interventions     No data to display
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
## 2024-04-21 NOTE — Progress Notes (Signed)
 Physical Therapy Treatment Patient Details Name: Jerome Vaughn MRN: [id number] DOB: 1951/07/01 Today's Date: 04/21/2024   History of Present Illness Jerome Vaughn is a 73 y.o. male admitted 04/15/24 after a tree branch fell on him whilehe was onhis roof. 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 Pt s/p C6-7 ACDF 7/22. No PMHx on file.    PT Comments  Pt greeted seated in recliner chair eating lunch with wife present. He was pleasant and agreeable to a short PT session in order for him to return to his meal. Pt increased gait distance, ambulating ~125ft using RW with minA and a chair follow. He demonstrated improved cardiopulmonary endurance as he did not require a seated or standing rest break during mobility. Pt was unsteady during gait especially when sequencing turns. He relies heavily on his LEs for stability and light BUE support on RW. Cues for improved technique and increased safety awareness. Patient will benefit from intensive inpatient follow-up therapy, >3 hours/[place].      If plan is discharge home, recommend the following: A little help with walking and/or transfers;A lot of help with bathing/dressing/bathroom;Assistance with cooking/housework;Assistance with feeding;Assist for transportation;Help with stairs or ramp for entrance   Can travel by private vehicle        Equipment Recommendations  Rolling walker (2 wheels)    Recommendations for Other Services       Precautions / Restrictions Precautions Precautions: Fall Recall of Precautions/Restrictions: Intact Required Braces or Orthoses: Cervical Brace Cervical Brace: Soft collar;Other (comment) (may remove when in bed, may ambulate to bathroom without brace, may apply in sitting, may remove brace to shower) Restrictions Weight Bearing Restrictions Per Provider Order: No     Mobility  Bed Mobility               General bed mobility comments: Not assessed. Pt greeted in recliner chair and returned there at end  of session.    Transfers Overall transfer level: Needs assistance Equipment used: Rolling walker (2 wheels) Transfers: Sit to/from Stand Sit to Stand: Min assist           General transfer comment: Cued pt to scoot fwd to edge of chair. Pt placed RUE on RW and pushed up from armrest with LUE. Powered up with minA. Increased time to stabilize. Good eccentric control with sitting.    Ambulation/Gait Ambulation/Gait assistance: Min assist, +2 safety/equipment (chair follow) Gait Distance (Feet): 100 Feet Assistive device: Rolling walker (2 wheels) Gait Pattern/deviations: Step-through pattern, Decreased stride length, Trunk flexed, Drifts right/left, Narrow base of support Gait velocity: decreased Gait velocity interpretation: <1.31 ft/sec, indicative of household ambulator   General Gait Details: Pt maintained an upright posture and good proximity to RW. He applied light weight through BUE support on RW to add support, but primarily relied on his LEs. Pt was slightly unsteady during gait, especially while turning. He had difficulty pivoting RW and his BOS narrowed when turning left/right. Cues for improved sequencing and assist to stabilize.   Stairs             Wheelchair Mobility     Tilt Bed    Modified Rankin (Stroke Patients Only)       Balance Overall balance assessment: Needs assistance Sitting-balance support: No upper extremity supported, Feet supported Sitting balance-Leahy Scale: Fair     Standing balance support: Bilateral upper extremity supported, During functional activity, Reliant on assistive device for balance Standing balance-Leahy Scale: Poor Standing balance comment: Pt dependent on RW  Communication Communication Communication: No apparent difficulties  Cognition Arousal: Alert Behavior During Therapy: WFL for tasks assessed/performed   PT - Cognitive impairments: No apparent impairments                          Following commands: Intact      Cueing Cueing Techniques: Verbal cues  Exercises General Exercises - Lower Extremity Long Arc Quad: Seated, Both, 5 reps, AROM (with 5 second hold at top) Hip Flexion/Marching: Seated, Both, 10 reps, AROM    General Comments General comments (skin integrity, edema, etc.): Wife present and supportive throughout session.      Pertinent Vitals/Pain Pain Assessment Pain Assessment: Faces Faces Pain Scale: Hurts little more Pain Location: B Shoulders Pain Descriptors / Indicators: Discomfort, Aching, Sore Pain Intervention(s): Monitored during session, Limited activity within patient's tolerance, Repositioned    Home Living                          Prior Function            PT Goals (current goals can now be found in the care plan section) Acute Rehab PT Goals Patient Stated Goal: Regain independence Progress towards PT goals: Progressing toward goals    Frequency    Min 3X/week      PT Plan      Co-evaluation              AM-PAC PT 6 Clicks Mobility   Outcome Measure  Help needed turning from your back to your side while in a flat bed without using bedrails?: A Little Help needed moving from lying on your back to sitting on the side of a flat bed without using bedrails?: A Little Help needed moving to and from a bed to a chair (including a wheelchair)?: A Little Help needed standing up from a chair using your arms (e.g., wheelchair or bedside chair)?: A Little Help needed to walk in hospital room?: A Little Help needed climbing 3-5 steps with a railing? : A Lot 6 Click Score: 17    End of Session Equipment Utilized During Treatment: Gait belt Activity Tolerance: Patient tolerated treatment well;Other (comment) (Length of session limited d/t pt's lunch being present in room and his desire to return and eat his meal.) Patient left: in chair;with call bell/phone within reach;with chair  alarm set;with family/visitor present Nurse Communication: Mobility status PT Visit Diagnosis: Other symptoms and signs involving the nervous system (R29.898);Difficulty in walking, not elsewhere classified (R26.2);Unsteadiness on feet (R26.81);Other abnormalities of gait and mobility (R26.89)     Time: 8591-8577 PT Time Calculation (min) (ACUTE ONLY): 14 min  Charges:    $Gait Training: 8-22 mins PT General Charges $$ ACUTE PT VISIT: 1 Visit                     Randall SAUNDERS, PT, DPT Acute Rehabilitation Services Office: [phone] Secure Chat Preferred  Jerome Vaughn 04/21/2024, 2:57 PM

## 2024-04-22 ENCOUNTER — Ambulatory Visit: Admitting: Podiatry

## 2024-04-22 ENCOUNTER — Inpatient Hospital Stay (HOSPITAL_COMMUNITY)

## 2024-04-22 DIAGNOSIS — G8250 Quadriplegia, unspecified: Secondary | ICD-10-CM | POA: Diagnosis not present

## 2024-04-22 DIAGNOSIS — M7989 Other specified soft tissue disorders: Secondary | ICD-10-CM | POA: Diagnosis not present

## 2024-04-22 LAB — CBC WITH DIFFERENTIAL/PLATELET
Abs Immature Granulocytes: 0.01 K/uL (ref 0.00–0.07)
Basophils Absolute: 0 K/uL (ref 0.0–0.1)
Basophils Relative: 1 %
Eosinophils Absolute: 0.1 K/uL (ref 0.0–0.5)
Eosinophils Relative: 2 %
HCT: 37.6 % — ABNORMAL LOW (ref 39.0–52.0)
Hemoglobin: 12.2 g/dL — ABNORMAL LOW (ref 13.0–17.0)
Immature Granulocytes: 0 %
Lymphocytes Relative: 33 %
Lymphs Abs: 1.2 K/uL (ref 0.7–4.0)
MCH: 29 pg (ref 26.0–34.0)
MCHC: 32.4 g/dL (ref 30.0–36.0)
MCV: 89.5 fL (ref 80.0–100.0)
Monocytes Absolute: 0.5 K/uL (ref 0.1–1.0)
Monocytes Relative: 14 %
Neutro Abs: 1.8 K/uL (ref 1.7–7.7)
Neutrophils Relative %: 50 %
Platelets: 112 K/uL — ABNORMAL LOW (ref 150–400)
RBC: 4.2 MIL/uL — ABNORMAL LOW (ref 4.22–5.81)
RDW: 14 % (ref 11.5–15.5)
WBC: 3.5 K/uL — ABNORMAL LOW (ref 4.0–10.5)
nRBC: 0 % (ref 0.0–0.2)

## 2024-04-22 LAB — COMPREHENSIVE METABOLIC PANEL WITH GFR
ALT: 32 U/L (ref 0–44)
AST: 38 U/L (ref 15–41)
Albumin: 3.2 g/dL — ABNORMAL LOW (ref 3.5–5.0)
Alkaline Phosphatase: 34 U/L — ABNORMAL LOW (ref 38–126)
Anion gap: 9 (ref 5–15)
BUN: 15 mg/dL (ref 8–23)
CO2: 24 mmol/L (ref 22–32)
Calcium: 9.1 mg/dL (ref 8.9–10.3)
Chloride: 105 mmol/L (ref 98–111)
Creatinine, Ser: 1.06 mg/dL (ref 0.61–1.24)
GFR, Estimated: >60 mL/min (ref >60)
Glucose, Bld: 169 mg/dL — ABNORMAL HIGH (ref 70–99)
Potassium: 4.3 mmol/L (ref 3.5–5.1)
Sodium: 138 mmol/L (ref 135–145)
Total Bilirubin: 0.7 mg/dL (ref 0.0–1.2)
Total Protein: 7.1 g/dL (ref 6.5–8.1)

## 2024-04-22 LAB — GLUCOSE, CAPILLARY
Glucose-Capillary: 172 mg/dL — ABNORMAL HIGH (ref 70–99)
Glucose-Capillary: 189 mg/dL — ABNORMAL HIGH (ref 70–99)
Glucose-Capillary: 204 mg/dL — ABNORMAL HIGH (ref 70–99)
Glucose-Capillary: 252 mg/dL — ABNORMAL HIGH (ref 70–99)

## 2024-04-22 MED ORDER — INSULIN ASPART 100 UNIT/ML IJ SOLN
0.0000 [IU] | Freq: Every day | INTRAMUSCULAR | Status: DC
  Administered 2024-04-22: 3 [IU] via SUBCUTANEOUS
  Administered 2024-04-23: 2 [IU] via SUBCUTANEOUS
  Administered 2024-04-24: 4 [IU] via SUBCUTANEOUS
  Administered 2024-04-25 – 2024-05-08 (×5): 2 [IU] via SUBCUTANEOUS

## 2024-04-22 MED ORDER — INSULIN ASPART 100 UNIT/ML IJ SOLN
0.0000 [IU] | Freq: Three times a day (TID) | INTRAMUSCULAR | Status: DC
  Administered 2024-04-22: 2 [IU] via SUBCUTANEOUS
  Administered 2024-04-22: 3 [IU] via SUBCUTANEOUS
  Administered 2024-04-23: 2 [IU] via SUBCUTANEOUS
  Administered 2024-04-23: 3 [IU] via SUBCUTANEOUS
  Administered 2024-04-23 – 2024-04-24 (×4): 2 [IU] via SUBCUTANEOUS
  Administered 2024-04-25: 5 [IU] via SUBCUTANEOUS
  Administered 2024-04-25: 2 [IU] via SUBCUTANEOUS
  Administered 2024-04-25: 3 [IU] via SUBCUTANEOUS
  Administered 2024-04-26: 2 [IU] via SUBCUTANEOUS
  Administered 2024-04-26 – 2024-04-28 (×8): 3 [IU] via SUBCUTANEOUS
  Administered 2024-04-29: 2 [IU] via SUBCUTANEOUS
  Administered 2024-04-29: 3 [IU] via SUBCUTANEOUS
  Administered 2024-04-29: 2 [IU] via SUBCUTANEOUS
  Administered 2024-04-30: 3 [IU] via SUBCUTANEOUS
  Administered 2024-04-30 (×2): 1 [IU] via SUBCUTANEOUS
  Administered 2024-05-01: 2 [IU] via SUBCUTANEOUS
  Administered 2024-05-01 – 2024-05-02 (×3): 1 [IU] via SUBCUTANEOUS
  Administered 2024-05-02: 2 [IU] via SUBCUTANEOUS
  Administered 2024-05-02: 1 [IU] via SUBCUTANEOUS
  Administered 2024-05-03: 3 [IU] via SUBCUTANEOUS
  Administered 2024-05-03 – 2024-05-04 (×3): 1 [IU] via SUBCUTANEOUS
  Administered 2024-05-04: 2 [IU] via SUBCUTANEOUS
  Administered 2024-05-04: 1 [IU] via SUBCUTANEOUS
  Administered 2024-05-05: 2 [IU] via SUBCUTANEOUS
  Administered 2024-05-05 – 2024-05-06 (×3): 1 [IU] via SUBCUTANEOUS
  Administered 2024-05-06 – 2024-05-07 (×3): 2 [IU] via SUBCUTANEOUS
  Administered 2024-05-07: 1 [IU] via SUBCUTANEOUS
  Administered 2024-05-07: 3 [IU] via SUBCUTANEOUS
  Administered 2024-05-08 (×2): 2 [IU] via SUBCUTANEOUS
  Administered 2024-05-09: 3 [IU] via SUBCUTANEOUS
  Administered 2024-05-09 (×2): 2 [IU] via SUBCUTANEOUS
  Administered 2024-05-09: 3 [IU] via SUBCUTANEOUS
  Administered 2024-05-09 (×2): 2 [IU] via SUBCUTANEOUS
  Administered 2024-05-10: 1 [IU] via SUBCUTANEOUS
  Administered 2024-05-10: 2 [IU] via SUBCUTANEOUS
  Administered 2024-05-10 (×2): 1 [IU] via SUBCUTANEOUS
  Administered 2024-05-10: 2 [IU] via SUBCUTANEOUS
  Administered 2024-05-10: 1 [IU] via SUBCUTANEOUS
  Administered 2024-05-11: 2 [IU] via SUBCUTANEOUS
  Administered 2024-05-11 (×2): 1 [IU] via SUBCUTANEOUS
  Administered 2024-05-11: 2 [IU] via SUBCUTANEOUS
  Administered 2024-05-11 (×2): 1 [IU] via SUBCUTANEOUS
  Administered 2024-05-12: 2 [IU] via SUBCUTANEOUS
  Administered 2024-05-12: 1 [IU] via SUBCUTANEOUS
  Administered 2024-05-12: 7 [IU] via SUBCUTANEOUS
  Administered 2024-05-13: 5 [IU] via SUBCUTANEOUS
  Administered 2024-05-13 (×2): 2 [IU] via SUBCUTANEOUS
  Administered 2024-05-14: 3 [IU] via SUBCUTANEOUS
  Administered 2024-05-14 – 2024-05-15 (×3): 2 [IU] via SUBCUTANEOUS
  Administered 2024-05-15: 3 [IU] via SUBCUTANEOUS
  Administered 2024-05-15: 2 [IU] via SUBCUTANEOUS
  Administered 2024-05-16: 5 [IU] via SUBCUTANEOUS
  Administered 2024-05-16: 1 [IU] via SUBCUTANEOUS
  Administered 2024-05-16: 2 [IU] via SUBCUTANEOUS
  Administered 2024-05-17: 3 [IU] via SUBCUTANEOUS
  Administered 2024-05-17: 2 [IU] via SUBCUTANEOUS

## 2024-04-22 MED ORDER — MUSCLE RUB 10-15 % EX CREA
TOPICAL_CREAM | CUTANEOUS | Status: DC | PRN
  Administered 2024-04-30: 1 via TOPICAL
  Filled 2024-04-22 (×2): qty 85

## 2024-04-22 NOTE — Progress Notes (Signed)
 Occupational Therapy Session Note  Patient Details  Name: Jerome Vaughn MRN: [id number] Date of Birth: 01-01-1951  Today's Date: 04/22/2024 OT Individual Time: 1303-1400 OT Individual Time Calculation (min): 57 min    Short Term Goals: Week 1:  OT Short Term Goal 1 (Week 1): Pt will consistently direct care with family and staff regarding use/need of AE and orthotics for self feeding and/or daily tasks. OT Short Term Goal 2 (Week 1): Pt will completed UB dressing with Max A and use of compensatory techniques. OT Short Term Goal 3 (Week 1): Pt will completed simple grooming task such as brushing teeth with Max A and use of adaptive equipment  Skilled Therapeutic Interventions/Progress Updates:  Patient agreeable to participate in OT session. Reports 0/10 pain level.   Patient participated in skilled OT session focusing on BUE strength and coordination assessment, functional transfers, bed mobility, and patient education.   Pt in bed upon therapy arrival. Had not fully finished lunch although able to pause eating to participate in therapy session. Pt completed bed mobility with SBA. HOB elevated and bed rail was used.   LB dressing completed while seated then standing at EOB with Total A for pants and shoes. Pt and wife educated on different shoe brands that make slip on shoes if interested.  Pt completed sit to stand with RW from seated EOB. VC for hand placement with RW management prior to sit to stand transition. Pt with flexed trunk and posterior bias initially requiring VC and physical assist to self correct standing posture prior to complete step pivot transfer to [facility] with Min A provided. Increased time required to complete transfer d/t need of standing posture adjustment.   Assessed pt's BUE hand strength and coordination:  Left grip: 20#, Right grip: 10#  Left lateral pinch: 4.5#, Right lateral pinch: 3#  Box and Blocks: R: 5, L: 18   Pt educated on OT goals during rehab stay and  provided input as appropriate.    Therapy Documentation Precautions:  Precautions Precautions: Fall Recall of Precautions/Restrictions: Intact Precaution/Restrictions Comments: B rotator cuff tears Required Braces or Orthoses: Cervical Brace Cervical Brace: Soft collar, Other (comment) Other Brace: Can remove when in bed and when showering. May apply/remove when sitting Restrictions Weight Bearing Restrictions Per Provider Order: No  Therapy/Group: Individual Therapy  Leita Howell, OTR/L,CBIS  Supplemental OT - [facility] and [facility] Secure Chat Preferred   04/22/2024, 4:22 PM

## 2024-04-22 NOTE — Progress Notes (Signed)
 Orthopedic Tech Progress Note Patient Details:  Jerome Vaughn 10-06-1950 [id number]  Ortho Devices Type of Ortho Device: Soft collar Ortho Device/Splint Location: Neck Ortho Device/Splint Interventions: Ordered, Application   Post Interventions Patient Tolerated: Well  Jerome Vaughn 04/22/2024, 4:49 PM

## 2024-04-22 NOTE — Progress Notes (Signed)
 Bilateral lower extremity venous duplex has been completed. Preliminary results can be found in CV Proc through chart review.   04/22/24 11:49 AM Cathlyn Collet RVT

## 2024-04-22 NOTE — IPOC Note (Signed)
 Overall Plan of Care [facility]) Patient Details Name: Jerome Vaughn MRN: [id number] DOB: [birth year]  Admitting Diagnosis: Acute incomplete quadriplegia [facility])  Hospital Problems: Principal Problem:   Acute incomplete quadriplegia (HCC)     Functional Problem List: Nursing Bladder, Bowel, Edema, Endurance, Medication Management, Motor, Pain, Safety, Sensory, Skin Integrity  PT Balance, Endurance, Motor, Pain, Safety, Sensory, Skin Integrity  OT Balance, Endurance, Motor, Sensory, Pain  SLP    TR         Basic ADL's: OT Eating, Grooming, Bathing, Dressing, Toileting     Advanced  ADL's: OT None     Transfers: PT Bed to Chair, Bed Mobility, Car, Floor, Occupational psychologist, Research scientist (life sciences): PT Ambulation, Psychologist, prison and probation services, Stairs     Additional Impairments: OT Fuctional Use of Upper Extremity  SLP        TR      Anticipated Outcomes Item Anticipated Outcome  Self Feeding Set-up  Swallowing      Basic self-care  Min A  Toileting  Min A   Bathroom Transfers SBA  Bowel/Bladder  manage bowels with medications/ manage bladder with toileting assistance  Transfers  supervision overall  Locomotion  supervision gait; min A stairs  Communication     Cognition     Pain  <4 w/ prns  Safety/Judgment  Manage safety with supervision-min assistance   Therapy Plan: PT Intensity: Minimum of 1-2 x/Lamay ,45 to 90 minutes PT Frequency: 5 out of 7 days PT Duration Estimated Length of Stay: 2-2.5 weeks OT Intensity: Minimum of 1-2 x/Timm, 45 to 90 minutes OT Frequency: 5 out of 7 days OT Duration/Estimated Length of Stay: 2-2.5 weeks     Team Interventions: Nursing Interventions Patient/Family Education, Medication Management, Bladder Management, Bowel Management, Disease Management/Prevention, Pain Management, Discharge Planning, Skin Care/Wound Management  PT interventions Ambulation/gait training, Balance/vestibular training, Community reintegration,  Discharge planning, Disease management/prevention, DME/adaptive equipment instruction, Functional mobility training, Neuromuscular re-education, Pain management, Patient/family education, Psychosocial support, Skin care/wound management, Splinting/orthotics, Stair training, Therapeutic Activities, Therapeutic Exercise, UE/LE Strength taining/ROM, UE/LE Coordination activities, Wheelchair propulsion/positioning  OT Interventions Warden/ranger, Neuromuscular re-education, Self Care/advanced ADL retraining, Therapeutic Exercise, Wheelchair propulsion/positioning, DME/adaptive equipment instruction, Pain management, UE/LE Strength taining/ROM, Skin care/wound managment, Community reintegration, Functional electrical stimulation, Patient/family education, Splinting/orthotics, UE/LE Coordination activities, Discharge planning, Functional mobility training, Therapeutic Activities, Psychosocial support  SLP Interventions    TR Interventions    SW/CM Interventions Discharge Planning, Psychosocial Support, Patient/Family Education   Barriers to Discharge MD  Medical stability, Home enviroment access/loayout, Neurogenic bowel and bladder, Wound care, Lack of/limited family support, and Weight bearing restrictions  Nursing Decreased caregiver support, Home environment access/layout, Neurogenic Bowel & Bladder Discharge: House  Discharge Home Layout: One level  Discharge Home Access: Stairs to enter  Entrance Stairs-Rails: None  Entrance Stairs-Number of Steps: 3  PT Inaccessible home environment does not have rails for home entry  OT Decreased caregiver support    SLP      SW Lack of/limited family support, Community education officer for SNF coverage, Decreased caregiver support     Team Discharge Planning: Destination: PT-Home ,OT- Home , SLP-  Projected Follow-up: PT-24 hour supervision/assistance, Outpatient PT, OT-  Home health OT, SLP-  Projected Equipment Needs: PT-To be determined, OT- 3 in 1 bedside  comode, To be determined, SLP-  Equipment Details: PT-may need RW (thinks they might have one at home), OT-Pt owns: [facility], built in shower seat Patient/family involved in discharge planning: PT- Patient,  OT-Patient,  SLP-   MD ELOS: 2-2.5 weeks Medical Rehab Prognosis:  Good Assessment: The patient has been admitted for CIR therapies with the diagnosis of incomplete quadriplegia- due to fall. The team will be addressing functional mobility, strength, stamina, balance, safety, adaptive techniques and equipment, self-care, bowel and bladder mgt, patient and caregiver education, . Goals have been set at SBA to min A. Anticipated discharge destination is home with wife.        See Team Conference Notes for weekly updates to the plan of care

## 2024-04-22 NOTE — Plan of Care (Signed)
  Problem: Consults Goal: RH SPINAL CORD INJURY PATIENT EDUCATION Description:  See Patient Education module for education specifics.  Outcome: Progressing   Problem: SCI BOWEL ELIMINATION Goal: RH STG MANAGE BOWEL WITH ASSISTANCE Description: STG Manage Bowel with supervision-min Assistance. Outcome: Progressing   Problem: SCI BLADDER ELIMINATION Goal: RH STG MANAGE BLADDER WITH ASSISTANCE Description: STG Manage Bladder With supervision-min Assistance Outcome: Progressing   Problem: RH SKIN INTEGRITY Goal: RH STG SKIN FREE OF INFECTION/BREAKDOWN Description: Manage skin free of infection with supervision - min assistance Outcome: Progressing   Problem: RH SAFETY Goal: RH STG ADHERE TO SAFETY PRECAUTIONS W/ASSISTANCE/DEVICE Description: STG Adhere to Safety Precautions With supervision- min Assistance/Device. Outcome: Progressing   Problem: RH PAIN MANAGEMENT Goal: RH STG PAIN MANAGED AT OR BELOW PT'S PAIN GOAL Description: <4 w/ prns Outcome: Progressing   Problem: RH KNOWLEDGE DEFICIT SCI Goal: RH STG INCREASE KNOWLEDGE OF SELF CARE AFTER SCI Description: Manage increase knowledge of self care after SCI with supervision- min assistance from wife using educational materials provided Outcome: Progressing

## 2024-04-22 NOTE — Progress Notes (Signed)
 Inpatient Rehabilitation Care Coordinator Assessment and Plan Patient Details  Name: Jerome Vaughn MRN: [id number] Date of Birth: 11-29-1950  Today's Date: 04/22/2024  Hospital Problems: Principal Problem:   Acute incomplete quadriplegia [facility])  Past Medical History:  Past Medical History:  Diagnosis Date   Carpal tunnel syndrome    both hands   Colon polyps    Coronary artery disease    sees Dr. [name]   Diabetes mellitus    sees Dr. [name]   Diabetes mellitus [facility]) 2009   Ganglion cyst of wrist, right 1989   GERD (gastroesophageal reflux disease)    History of echocardiogram    a. Echo 4/17: Moderate LVH, EF 50-55%, mild LAE, no pericardial effusion   Hyperlipidemia    Hypertension    MGUS (monoclonal gammopathy of unknown significance)    sees Dr. [name]   Neuropathy    gets foot exams with Dr. [name] (podiatry)   Pneumonia    Rotator cuff injury    Past Surgical History:  Past Surgical History:  Procedure Laterality Date   ANTERIOR CERVICAL DECOMP/DISCECTOMY FUSION N/A 04/19/2024   Procedure: ANTERIOR CERVICAL DECOMPRESSION/DISCECTOMY FUSION CERVICAL SIX-SEVEN;  Surgeon: [name] MATSU, MD;  Location: [facility] OR;  Service: Neurosurgery;  Laterality: N/A;  ACDF C67   CARDIAC CATHETERIZATION N/A 11/21/2015   Procedure: Left Heart Cath and Coronary Angiography;  Surgeon: [name], MD;  Location: [facility] INVASIVE CV LAB;  Service: Cardiovascular;  Laterality: N/A;   COLONOSCOPY  03/18/2024   per Dr. [name], adenomatous polyp, repeat in 5 yrs   CORONARY ARTERY BYPASS GRAFT N/A 11/22/2015   Procedure: CORONARY ARTERY BYPASS GRAFTING (CABG) times five using the left internal mammary, right greater saphenous vein EVH, and left thigh greater saphenous vein EVH;  Surgeon: [name], MD;  Location: [facility] OR;  Service: Open Heart Surgery;  Laterality: N/A;   CORONARY ARTERY BYPASS GRAFT  2017   CORONARY PRESSURE/FFR STUDY N/A 12/14/2018   Procedure:  INTRAVASCULAR PRESSURE WIRE/FFR STUDY;  Surgeon: [name], MD;  Location: [facility] INVASIVE CV LAB;  Service: Cardiovascular;  Laterality: N/A;   ESOPHAGOGASTRODUODENOSCOPY  03/18/2024   per Dr. [name], normal   frozen shoulder release Right    GANGLION CYST EXCISION Right 1989   LEFT HEART CATH AND CORS/GRAFTS ANGIOGRAPHY N/A 10/27/2017   Procedure: LEFT HEART CATH AND CORS/GRAFTS ANGIOGRAPHY;  Surgeon: Sharps [name], MD;  Location: [name];  Service: Cardiovascular;  Laterality: N/A;   LEFT HEART CATH AND CORS/GRAFTS ANGIOGRAPHY N/A 12/14/2018   Procedure: LEFT HEART CATH AND CORS/GRAFTS ANGIOGRAPHY;  Surgeon: [name], MD;  Location: [facility] INVASIVE CV LAB;  Service: Cardiovascular;  Laterality: N/A;   TEE WITHOUT CARDIOVERSION N/A 11/22/2015   Procedure: TRANSESOPHAGEAL ECHOCARDIOGRAM (TEE);  Surgeon: [name], MD;  Location: [facility] OR;  Service: Open Heart Surgery;  Laterality: N/A;   WRIST GANGLION EXCISION Right    Social History:  reports that he has never smoked. He has never used smokeless tobacco. He reports current alcohol use of about 3.0 - 4.0 standard drinks of alcohol per week. He reports that he does not use drugs.  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 She is able to provide 24/7 care but no physical assistance. Caregiver Availability: 24/7 Family Dynamics: Pt lives with his wife.  Social History Preferred language: English Religion:  Non-Denominational Cultural Background: Pt is not a Cytogeneticist. Education: high school grad Health Literacy - How often do you need to have someone help you when you read instructions, pamphlets, or other written material from your doctor or pharmacy?: Never Writes: Yes Employment Status: Employed Length of Employment:  (4.5 years) Return to Work Plans: TBD. Has FMLA for employer and will get SW the forms. Legal History/Current Legal Issues: Denies Guardian/Conservator: N/A   Abuse/Neglect Abuse/Neglect Assessment Can Be Completed: Yes Physical Abuse: Denies Verbal Abuse: Denies Sexual Abuse: Denies Exploitation of patient/patient's resources: Denies Self-Neglect: Denies  Patient response to: Social Isolation - How often do you feel lonely or isolated from those around you?: Never  Emotional Status Pt's affect, behavior and adjustment status: Pt in good spirits at time of visit. Recent Psychosocial Issues: Denies Psychiatric History: Denies Substance Abuse History: Denies  Patient / Family Perceptions, Expectations & Goals Pt/Family understanding of illness & functional limitations: Pt and wife have a general understanding of care needs Premorbid pt/family roles/activities: Independent Anticipated changes in roles/activities/participation: Assistance with ADLs/IADLs Pt/family expectations/goals: Pt goal is to work on getting stability back, upper body , walking without weakeness and getting back to normal  where I can walk and feed myself  [employer]: None Premorbid Home Care/DME Agencies: None Transportation available at discharge: Wife Is the patient able to respond to transportation needs?: Yes In the past 12 months, has lack of transportation kept you from medical appointments or from getting medications?: No In the past 12 months, has lack of transportation kept you from meetings, work, or from getting things needed for daily living?:  No Resource referrals recommended: Neuropsychology  Discharge Planning Living Arrangements: Spouse/significant other Support Systems: Spouse/significant other Type of Residence: Private residence Insurance Resources: Media planner (specify) ([employer] Medicare) Financial Resources: Restaurant manager, fast food Screen Referred: No Living Expenses: Banker Management: Patient Does the patient have any problems obtaining your medications?: No Home Management: Pt and wife both share home care needs Patient/Family Preliminary Plans: TBD Care Coordinator Barriers to Discharge: Lack of/limited family support, Insurance for SNF coverage, Decreased caregiver support Care Coordinator Anticipated Follow Up Needs: HH/OP  Clinical Impression SW met with pt and pt wife in room to introduce self, explain role, discuss discharge process. Pt is not a Cytogeneticist. No HCPOA. No DME.  *SW received FMLA forms. SW shared forms can take 7-10 business days to complete. Forms left for PA- Dan.   Alexcis Bicking A Dianca Owensby 04/22/2024, 2:18 PM
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
## 2024-04-22 NOTE — Progress Notes (Signed)
 Inpatient Rehabilitation Admission Medication Review by a Pharmacist  A complete drug regimen review was completed for this patient to identify any potential clinically significant medication issues.  High Risk Drug Classes Is patient taking? Indication by Medication  Antipsychotic No   Anticoagulant No   Antibiotic No   Opioid Yes Tramadol  - PRN severe pain  Antiplatelet No   Hypoglycemics/insulin  Yes Insulin  aspart SSI - DM  Vasoactive Medication Yes Benazepril ,  Coreg - HTN/CAD Flomax - urinary retention  Chemotherapy No   Other Yes Acetaminophen  , Diclofenac  topical gel pain, muscle rub- pain management  Crestor , Zetia - HLD Gapapentin - neuropathic pain Lidocaine  jelly- cath urethral placement Methocarbamol - muscle spasms/pain Ondansetron  - prn N/V Protonix  -GERD Ranexa - CAD/CABG 2017 Docusate,miralax  - bowel regimen, constipation     Type of Medication Issue Identified Description of Issue Recommendation(s)  Drug Interaction(s) (clinically significant)     Duplicate Therapy     Allergy     No Medication Administration End Date     Incorrect Dose     Additional Drug Therapy Needed     Significant med changes from prior encounter (inform family/care partners about these prior to discharge). PTA medications: Aspirin , meloxicam , repaglinide ,  semaglutide  injection, ferrous sulfate, metformin  XR, ozempic  injection were not resumed. Communicate to patient /family/ caregiver prior to discharge and on the discharge AVS.  Other       Clinically significant medication issues were identified that warrant physician communication and completion of prescribed/recommended actions by midnight of the next [place]:  No  Name of provider notified for urgent issues identified:   Provider Method of Notification:     Pharmacist comments:   Time spent performing this drug regimen review (minutes):  25    Levorn Gaskins, RPh Clinical Pharmacist 04/22/2024 2:53 PM

## 2024-04-22 NOTE — Progress Notes (Signed)
 Met with patient to review current situation, team conference and plan of care. Reviewed medications, pain, bowel, bladder, use of brace, soft collar at all times and safety. Continue to follow along to provide educational needs to facilitate preparation for discharge.

## 2024-04-22 NOTE — Progress Notes (Signed)
 04/22/24 1500  Spiritual Encounters  Type of Visit Initial  Care provided to: Pt and family  Conversation partners present during encounter Nurse  Referral source Nurse (RN/NT/LPN)  Reason for visit Advance directives  OnCall Visit No   Chaplain responded to a consult request for Advance Directive education. The patient and his wife received the education.  Chaplain provided the Advance Directive packet as well as education on Advance Directives-documents an individual completes to communicate their health care directions in advance of a time when they may need them. Chaplain informed pt the documents which may be completed here in the hospital are the Living Will and Health Care Power of [place].  Chaplain informed that the Health Care Power of [place] is a legal document in which an individual names another person, their Health Care Agent, to make health care decisions when the individual is not able to make them for themselves. The Health Care Agent's function can be temporary or permanent depending on the pt's ability to make and communicate those decisions independently. Chaplain informed pt in the absence of a Health Care Power of Attorney, the state of [place]  directs health care providers to look to the following individuals in the order listed: legal guardian; an attorney?in?fact under a general power of attorney (POA) if that POA includes the right to make health care decisions; a husband or wife; a majority of parents and adult children; a majority of adult brothers and sisters; or an individual who has an established relationship with you, who is acting in good faith and who can convey your wishes.  If none of these person are available or willing to make medical decisions on a patient's behalf, the law allows the patient's doctor to make decisions for them as long as another doctor agrees with those decisions.  Chaplain also informed the patient that the Health Care agent has no  decision-making authority over any affairs other than those related to his or her medical care.  The chaplain further educated the pt that a Living Will is a legal document that allows an individual to state his or her desire not to receive life-prolonging measures in the event that they have a condition that is incurable and will result in their death in a short period of time; they are unconscious, and doctors are confident that they will not regain consciousness; and/or they have advanced dementia or other substantial and irreversible loss of mental function. The chaplain informed pt that life-prolonging measures are medical treatments that would only serve to postpone death, including breathing machines, kidney dialysis, antibiotics, artificial nutrition and hydration (tube feeding), and similar forms of treatment and that if an individual is able to express their wishes, they may also make them known without the use of a Living Will, but in the event that an individual is not able to express their wishes themselves, a Living Will allows medical providers and the pt's family and friends ensure that they are not making decisions on the pt's behalf, but rather serving as the pt's voice to convey decisions the pt has already made.  The patient is aware that the decision to create an advance directive is theirs alone and they may chose not to complete the documents or may chose to complete one portion or both.  The patient was informed that they can revoke the documents at any time by striking through them and writing void or by completing new documents, but that it is also advisable that the individual  verbally notify interested parties that their wishes have changed.  They are also aware that the document must be signed in the presence of a notary public and two witnesses and that this can be done while the patient is still admitted to the hospital or after discharge in the community. If they decide to  complete Advance Directives after being discharged from the hospital, they have been advised to notify all interested parties and to provide those documents to their physicians and loved ones in addition to bringing them to the hospital in the event of another hospitalization.  The chaplain informed the pt that if they desire to proceed with completing Advance Directive Documentation while they are still admitted, notary services are typically available at [facility] between the hours of 8:30 and 3:30 Monday-Friday.    When the patient is ready to have these documents completed, the patient should request that their nurse place a spiritual care consult and indicate that the patient is ready to have their advance directives notarized so that arrangements for witnesses and notary public can be made.  Please page spiritual care if the patient desires further education or has questions.     M.Kubra Susanna Kerry Resident [phone]

## 2024-04-22 NOTE — Progress Notes (Signed)
 Inpatient Rehabilitation  Patient information reviewed and entered into eRehab system by [facility]. Karen Kays., CCC/SLP, PPS Coordinator.  Information including medical coding, functional ability and quality indicators will be reviewed and updated through discharge.

## 2024-04-22 NOTE — Evaluation (Signed)
 Physical Therapy Assessment and Plan  Patient Details  Name: Jerome Vaughn MRN: [id number] Date of Birth: July 09, 1951  PT Diagnosis: Ataxia, Difficulty walking, Impaired sensation, Muscle weakness, and Pain in joint Rehab Potential: Good ELOS: 2-2.5 weeks   Today's Date: 04/22/2024 PT Individual Time: 9064-8954 PT Individual Time Calculation (min): 70 min    Hospital Problem: Principal Problem:   Acute incomplete quadriplegia (HCC)   Past Medical History:  Past Medical History:  Diagnosis Date   Carpal tunnel syndrome    both hands   Colon polyps    Coronary artery disease    sees Dr. [name]   Diabetes mellitus    sees Dr. [name]   Diabetes mellitus [facility]) 2009   Ganglion cyst of wrist, right 1989   GERD (gastroesophageal reflux disease)    History of echocardiogram    a. Echo 4/17: Moderate LVH, EF 50-55%, mild LAE, no pericardial effusion   Hyperlipidemia    Hypertension    MGUS (monoclonal gammopathy of unknown significance)    sees Dr. [name]   Neuropathy    gets foot exams with Dr. [name] (podiatry)   Pneumonia    Rotator cuff injury    Past Surgical History:  Past Surgical History:  Procedure Laterality Date   ANTERIOR CERVICAL DECOMP/DISCECTOMY FUSION N/A 04/19/2024   Procedure: ANTERIOR CERVICAL DECOMPRESSION/DISCECTOMY FUSION CERVICAL SIX-SEVEN;  Surgeon: [name] MATSU, MD;  Location: [facility] OR;  Service: Neurosurgery;  Laterality: N/A;  ACDF C67   CARDIAC CATHETERIZATION N/A 11/21/2015   Procedure: Left Heart Cath and Coronary Angiography;  Surgeon: [name], MD;  Location: [facility] INVASIVE CV LAB;  Service: Cardiovascular;  Laterality: N/A;   COLONOSCOPY  03/18/2024   per Dr. [name], adenomatous polyp, repeat in 5 yrs   CORONARY ARTERY BYPASS GRAFT N/A 11/22/2015   Procedure: CORONARY ARTERY BYPASS GRAFTING (CABG) times five using the left internal mammary, right greater saphenous vein EVH, and left thigh greater saphenous vein EVH;   Surgeon: [name], MD;  Location: [facility] OR;  Service: Open Heart Surgery;  Laterality: N/A;   CORONARY ARTERY BYPASS GRAFT  2017   CORONARY PRESSURE/FFR STUDY N/A 12/14/2018   Procedure: INTRAVASCULAR PRESSURE WIRE/FFR STUDY;  Surgeon: [name], MD;  Location: [facility] INVASIVE CV LAB;  Service: Cardiovascular;  Laterality: N/A;   ESOPHAGOGASTRODUODENOSCOPY  03/18/2024   per Dr. [name], normal   frozen shoulder release Right    GANGLION CYST EXCISION Right 1989   LEFT HEART CATH AND CORS/GRAFTS ANGIOGRAPHY N/A 10/27/2017   Procedure: LEFT HEART CATH AND CORS/GRAFTS ANGIOGRAPHY;  Surgeon: Sharps [name], MD;  Location: [name];  Service: Cardiovascular;  Laterality: N/A;   LEFT HEART CATH AND CORS/GRAFTS ANGIOGRAPHY N/A 12/14/2018   Procedure: LEFT HEART CATH AND CORS/GRAFTS ANGIOGRAPHY;  Surgeon: [name], MD;  Location: [facility] INVASIVE CV LAB;  Service: Cardiovascular;  Laterality: N/A;   TEE WITHOUT CARDIOVERSION N/A 11/22/2015   Procedure: TRANSESOPHAGEAL ECHOCARDIOGRAM (TEE);  Surgeon: [name], MD;  Location: [facility] OR;  Service: Open Heart Surgery;  Laterality: N/A;   WRIST GANGLION EXCISION Right     Assessment & Plan Clinical Impression: Patient is a 73 year old right-handed male with history of diabetes mellitus, CAD with CABG 2017, hypertension, hyperlipidemia. Presented 04/15/2024 after being on a roof cutting a tree branch when the tree fell on him pinning him against the roof. No loss of conscious. He was found to be in shock and transfused 2+2, started on Levophed which was  quickly weaned off. Cranial CT scan showed slight frontal scalp edema. No acute intracranial abnormality. No skull fracture. CT cervical spine widening of the left C3-4 facet, but no jumped or perched facets. No acute fracture of the cervical spine. MRI cervical spine showed focal hyperintense T2 weighted signal within the spinal cord at C3-4 levels and at C7 indicating possible  myelomalacia or spinal cord edema. 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 Mild spinal canal stenosis and severe bilateral neural foraminal stenosis C3-4 CT of the chest abdomen pelvis unremarkable. CT maxillofacial showed soft tissue edema in the right cheek no acute facial bone fracture. Admission chemistries unremarkable aside glucose 177 creatinine 1.43 alkaline phosphatase 24, WBC 2.6, hemoglobin A1c 6.4, alcohol negative. Underwent arthrodesis C6-7 anterior interbody technique including discectomy for decompression of spinal cord and exiting nerve roots with foraminotomies. Placement of intervertebral biomechanical device C6-7 04/19/2024 per Dr. Dorn Ned. Placed in a soft cervical collar at all times. Tolerating a regular consistency diet.   Patient transferred to CIR on 04/21/2024 .    Patient currently requires mod with mobility secondary to muscle weakness and muscle joint tightness, decreased cardiorespiratoy endurance, and decreased sitting balance, decreased standing balance, decreased postural control, and decreased balance strategies.  Prior to hospitalization, patient was independent  with mobility and lived with Spouse, Family in a House home.  Home access is 3Stairs to enter.  Patient will benefit from skilled PT intervention to maximize safe functional mobility, minimize fall risk, and decrease caregiver burden for planned discharge home with 24 hour supervision.  Anticipate patient will benefit from follow up OP at discharge.  PT - End of Session Activity Tolerance: Decreased this session Endurance Deficit: Yes Endurance Deficit Description: pt reporting pain medication given earlier has made him feel a little woozy and out of it, rest breaks as needed and extra time PT Assessment Rehab Potential (ACUTE/IP ONLY): Good PT Barriers to Discharge: Inaccessible home environment PT Barriers to Discharge Comments: does not have rails for home entry PT  Patient demonstrates impairments in the following area(s): Balance;Endurance;Motor;Pain;Safety;Sensory;Skin Integrity PT Transfers Functional Problem(s): Bed to Chair;Bed Mobility;Car;Floor;Furniture PT Locomotion Functional Problem(s): Ambulation;Wheelchair Mobility;Stairs PT Plan PT Intensity: Minimum of 1-2 x/[place] ,45 to 90 minutes PT Frequency: 5 out of 7 days PT Duration Estimated Length of Stay: 2-2.5 weeks PT Treatment/Interventions: Ambulation/gait training;Balance/vestibular training;Community reintegration;Discharge planning;Disease management/prevention;DME/adaptive equipment instruction;Functional mobility training;Neuromuscular re-education;Pain management;Patient/family education;Psychosocial support;Skin care/wound management;Splinting/orthotics;Stair training;Therapeutic Activities;Therapeutic Exercise;UE/LE Strength taining/ROM;UE/LE Coordination activities;Wheelchair propulsion/positioning PT Transfers Anticipated Outcome(s): supervision overall PT Locomotion Anticipated Outcome(s): supervision gait; min A stairs PT Recommendation Recommendations for Other Services: Neuropsych consult;Therapeutic Recreation consult Therapeutic Recreation Interventions: Outing/community reintergration;Stress management Follow Up Recommendations: 24 hour supervision/assistance;Outpatient PT Patient destination: Home Equipment Recommended: To be determined Equipment Details: may need RW (thinks they might have one at home)   PT Evaluation Precautions/Restrictions Precautions Precautions: Fall Precaution/Restrictions Comments: B rotator cuff tears Required Braces or Orthoses: Cervical Brace Cervical Brace: Soft collar;Other (comment) Other Brace: Can remove when in bed and when showering. May apply/remove when sitting Restrictions Weight Bearing Restrictions Per Provider Order: No  Pain Reports some discomfort in his shoulders at neck, unrated but has taken pain medication which helped  (though he reports tramodol made him sleepy and out of it) Pain Interference Pain Interference Pain Effect on Sleep: 1. Rarely or not at all Pain Interference with Therapy Activities: 1. Rarely or not at all Pain Interference with [place] Activities: 1. 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, on warehouse floor operates forklift Vision/Perception  Vision - History Baseline Vision: Wears glasses all the time  Cognition Overall Cognitive Status: Within Functional Limits for tasks assessed Sensation Sensation Light Touch: Appears Intact (BLE) Coordination Gross Motor Movements are Fluid and Coordinated: No Fine Motor Movements are Fluid and Coordinated: No Coordination and Movement Description: decreased coordination in BUE and BLE Motor  Motor Motor: Tetraplegia;Abnormal postural alignment and control;Ataxia Motor - Skilled Clinical Observations: decreased coordination in BLE with functional gait and some truncal impairments   Trunk/Postural Assessment  Cervical Assessment Cervical Assessment: Exceptions to [facility] (soft collar) Thoracic Assessment Thoracic Assessment: Exceptions to [facility] (flexed posture) Lumbar Assessment Lumbar Assessment: Exceptions to [facility] (posterior pelvic tilt) Postural Control Postural Control: Deficits on evaluation  Balance Balance Balance Assessed: Yes Static Sitting Balance Static Sitting - Level of Assistance: 5: Stand by assistance Dynamic Sitting Balance Dynamic Sitting - Level of  Assistance: 4: Min assist Static Standing Balance Static Standing - Level of Assistance: 4: Min assist Dynamic Standing Balance Dynamic Standing - Level of Assistance: 3: Mod assist Extremity Assessment      RLE Assessment RLE Assessment: Exceptions to [facility] General Strength Comments: grossly 4+/5 except 4-/5 at hip; functional proximal weakness noted more in hips/trunk LLE Assessment LLE Assessment: Exceptions to [facility] General Strength Comments: grossly 4+/5 except 4-/5 at hip; functional proximal weakness noted more in hips/trunk  Care Tool Care Tool Bed Mobility Roll left and right activity        Sit to lying activity   Sit to lying assist level: Minimal Assistance - Patient > 75%    Lying to sitting on side of bed activity         Care Tool Transfers Sit to stand transfer   Sit to stand assist level: Minimal Assistance - Patient > 75%    Chair/bed transfer   Chair/bed transfer assist level: Moderate Assistance - Patient 50 - 74%    Car transfer   Car transfer assist level: Moderate Assistance - Patient 50 - 74%      Care Tool Locomotion Ambulation   Assist level: Moderate Assistance - Patient 50 - 74% Assistive device: Walker-rolling Max distance: 110'  Walk 10 feet activity   Assist level: Moderate Assistance - Patient - 50 - 74% Assistive device: Walker-rolling   Walk 50 feet with 2 turns activity   Assist level: Moderate Assistance - Patient - 50 - 74% Assistive device: Walker-rolling  Walk 150 feet activity Walk 150 feet activity did not occur: Safety/medical concerns      Walk 10 feet on uneven surfaces activity   Assist level: Moderate Assistance - Patient - 50 - 74% Assistive device: Walker-rolling  Stairs   Assist level: Moderate Assistance - Patient - 50 - 74% Stairs assistive device: 2 hand rails Max number of stairs: 4  Walk up/down 1 step activity   Walk up/down 1 step (curb) assist level: Moderate Assistance - Patient - 50 - 74% Walk up/down  1 step or curb assistive device: 2 hand rails  Walk up/down 4 steps activity   Walk up/down 4 steps assist level: Moderate Assistance - Patient - 50 - 74%  Walk up/down 4 steps assistive device: 2 hand rails  Walk up/down 12 steps activity Walk up/down 12 steps activity did not occur: Safety/medical concerns      Pick up small objects from floor   Pick up small object from the floor assist level: Total Assistance - Patient < 25%    Wheelchair Is the patient using a wheelchair?: Yes Type of Wheelchair: Manual   Wheelchair assist level: Total Assistance - Patient < 25% Max wheelchair distance: 10'  Wheel 50 feet with 2 turns activity   Assist Level: Total Assistance - Patient < 25%  Wheel 150 feet activity   Assist Level: Total Assistance - Patient < 25%    Refer to Care Plan for Long Term Goals  SHORT TERM GOAL WEEK 1 PT Short Term Goal 1 (Week 1): Pt will perform functional transfers with CGA PT Short Term Goal 2 (Week 1): Pt will be able to perform 4 steps with rails with min A PT Short Term Goal 3 (Week 1): Pt will be able to perform gait x 150' with min A  Recommendations for other services: Neuropsych and Therapeutic Recreation  Stress management and Outing/community reintegration  Skilled Therapeutic Intervention Mobility Transfers Transfers: Sit to Stand;Stand to Sit;Stand Pivot Transfers Sit to Stand: Minimal Assistance - Patient > 75% Stand to Sit: Minimal Assistance - Patient > 75% Stand Pivot Transfers: Minimal Assistance - Patient > 75% Stand Pivot Transfer Details: Verbal cues for precautions/safety;Verbal cues for technique;Manual facilitation for weight shifting;Tactile cues for posture Transfer (Assistive device): Rolling walker Locomotion  Gait Gait Distance (Feet): 110 Feet Assistive device: Rolling walker Gait Gait Pattern: Impaired Stairs / Additional Locomotion Stairs: Yes Stairs Assistance: Moderate Assistance - Patient 50 - 74% Stair Management  Technique: One rail Right;Step to pattern Number of Stairs: 4 Height of Stairs: 6 Wheelchair Mobility Wheelchair Mobility: Yes Wheelchair Assistance: Total Assistance - Patient <25% Wheelchair Propulsion: Both upper extremities Wheelchair Parts Management: Needs assistance Distance: 10  Evaluation completed (see details above and below) with education on PT POC and goals and individual treatment initiated with focus on functional transfers, w/c mobility and positioning (changed cushion to lower seat to floor height so pt may be able to propel with BLE (did not get a change to attempt at end of session), simulated car transfer with mod A with RW with assist for BLE management, gait training with cues for widening BOS, decreased coordination noted, applied R wrist splint to RW for improved grip, and stair negotiation with B handrails (heavy mod A needed due to decreased UE support due to weakness).     Discharge Criteria: Patient will be discharged from PT if patient refuses treatment 3 consecutive times without medical reason, if treatment goals not met, if there is a change in medical status, if patient makes no progress towards goals or if patient is discharged from hospital.  The above assessment, treatment plan, treatment alternatives and goals were discussed and mutually agreed upon: by patient  Elnor Donald Sherrell Donald WENDI Elnor, PT, DPT, CBIS  04/22/2024, 11:15 AM

## 2024-04-23 DIAGNOSIS — K592 Neurogenic bowel, not elsewhere classified: Secondary | ICD-10-CM | POA: Diagnosis not present

## 2024-04-23 DIAGNOSIS — G8250 Quadriplegia, unspecified: Secondary | ICD-10-CM | POA: Diagnosis not present

## 2024-04-23 DIAGNOSIS — I10 Essential (primary) hypertension: Secondary | ICD-10-CM

## 2024-04-23 DIAGNOSIS — N319 Neuromuscular dysfunction of bladder, unspecified: Secondary | ICD-10-CM

## 2024-04-23 DIAGNOSIS — R739 Hyperglycemia, unspecified: Secondary | ICD-10-CM

## 2024-04-23 LAB — GLUCOSE, CAPILLARY
Glucose-Capillary: 152 mg/dL — ABNORMAL HIGH (ref 70–99)
Glucose-Capillary: 224 mg/dL — ABNORMAL HIGH (ref 70–99)
Glucose-Capillary: 182 mg/dL — ABNORMAL HIGH (ref 70–99)
Glucose-Capillary: 206 mg/dL — ABNORMAL HIGH (ref 70–99)

## 2024-04-23 NOTE — Progress Notes (Signed)
 Occupational Therapy Session Note  Patient Details  Name: Jerome Vaughn MRN: [id number] Date of Birth: Sep 21, 1951  Today's Date: 04/23/2024 OT Individual Time: 9754-9669 OT Individual Time Calculation (min): 45 min    Short Term Goals: Week 1:  OT Short Term Goal 1 (Week 1): Pt will consistently direct care with family and staff regarding use/need of AE and orthotics for self feeding and/or daily tasks. OT Short Term Goal 2 (Week 1): Pt will completed UB dressing with Max A and use of compensatory techniques. OT Short Term Goal 3 (Week 1): Pt will completed simple grooming task such as brushing teeth with Max A and use of adaptive equipment  Skilled Therapeutic Interventions/Progress Updates:    Patient resting in bed at the time of arrival. Patient indicated that he was fatigued, but that he had no pain to report at the time of treatment. Patient was able to verbalize his cervical precautions for effective carryover. The pt went on to come from supine in bed to EOB with his soft collar brace in place and the [facility] evaluated.  The pt was able to attempt a simulated task in UB dressing  at MaxA for donning the theraband using the reacher for additional range. The pt was met with challenges in relation to grasp, grip strength and AROM.   The pt went on to complete AROM/PROM of BUE  in various planes with  full adherence to cervical  precaution.  The pt was able to execute bicep curl, and horizontal abduction using a 2lb dumb bell,  2 set of 10 in a  hand held position for the R and L hand  during the execution of bicep curls  and horizontal abduction with focus to grasp and grip.  At the end of the session, the pt was able to  transfer from EOB to supine in bed with MinA.  Prior to exiting the room, the call light and bed side table were put in place with the bed alarm activated. Therapy Documentation Precautions:  Precautions Precautions: Fall Recall of Precautions/Restrictions:  Intact Precaution/Restrictions Comments: B rotator cuff tears Required Braces or Orthoses: Cervical Brace Cervical Brace: Soft collar, Other (comment) Other Brace: Can remove when in bed and when showering. May apply/remove when sitting Restrictions Weight Bearing Restrictions Per Provider Order: No Therapy/Group: Individual Therapy  [name] 04/23/2024, 4:05 PM

## 2024-04-23 NOTE — Progress Notes (Signed)
 Occupational Therapy Session Note  Patient Details  Name: Jerome Vaughn MRN: [id number] Date of Birth: 03-Jan-1951  Today's Date: 04/23/2024 OT Individual Time: 9269-9153 OT Individual Time Calculation (min): 76 min    Short Term Goals: Week 1:  OT Short Term Goal 1 (Week 1): Pt will consistently direct care with family and staff regarding use/need of AE and orthotics for self feeding and/or daily tasks. OT Short Term Goal 2 (Week 1): Pt will completed UB dressing with Max A and use of compensatory techniques. OT Short Term Goal 3 (Week 1): Pt will completed simple grooming task such as brushing teeth with Max A and use of adaptive equipment  Skilled Therapeutic Interventions/Progress Updates:  Patient agreeable to participate in OT session. Reports low pain level, unable to quantify exact number to therapist,  therapist notified RN, medication provided by RN .   Patient participated in skilled OT session focusing on ADL participation and functional UE use. Patient and OT completed feeding task after sitting on EOB with mod A. Completed feeding task with mod A utilizing HOH techniques to increase patients ability to feed. Faded assist to min A and CG with some items. Patient required significant assist for coffee. Patient completed UB bathing mod A, UB dressing max, LB bathing max, LB dressing max to total. Returned to wc all needs in reach.    Therapy Documentation Precautions:  Precautions Precautions: Fall Recall of Precautions/Restrictions: Intact Precaution/Restrictions Comments: B rotator cuff tears Required Braces or Orthoses: Cervical Brace Cervical Brace: Soft collar, Other (comment) Other Brace: Can remove when in bed and when showering. May apply/remove when sitting Restrictions Weight Bearing Restrictions Per Provider Order: No   Therapy/Group: Individual Therapy  Jerome Vaughn 04/23/2024, 7:22 AM

## 2024-04-23 NOTE — Progress Notes (Signed)
 Physical Therapy Session Note  Patient Details  Name: Jerome Vaughn MRN: [id number] Date of Birth: 05-10-1951  Today's Date: 04/23/2024 PT Individual Time: 1004-1047 PT Individual Time Calculation (min): 43 min   Short Term Goals: Week 1:  PT Short Term Goal 1 (Week 1): Pt will perform functional transfers with CGA PT Short Term Goal 2 (Week 1): Pt will be able to perform 4 steps with rails with min A PT Short Term Goal 3 (Week 1): Pt will be able to perform gait x 150' with min A  Skilled Therapeutic Interventions/Progress Updates:  Pt was seen bedside in the am. Pt transferred supine to edge of bed with min A. Pt tolerated edge of bed with S. Pt transferred sit to stand with rolling walker and contact guard. Pt ambulated 148, 180 and 148 with rolling walker and min A with verbal cues. Pt transferred edge of bed to supine with contact guard. Pt left sitting up in bed with bed alarm and all needs within reach.   Therapy Documentation Precautions:  Precautions Precautions: Fall Recall of Precautions/Restrictions: Intact Precaution/Restrictions Comments: B rotator cuff tears Required Braces or Orthoses: Cervical Brace Cervical Brace: Soft collar, Other (comment) Other Brace: Can remove when in bed and when showering. May apply/remove when sitting Restrictions Weight Bearing Restrictions Per Provider Order: No General:  Pain: Pain Assessment Pain Scale: 0-10 Pain Score: 0-No pain   Therapy/Group: Individual Therapy  Merilee Lynwood MATSU 04/23/2024, 11:44 AM
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
## 2024-04-23 NOTE — Progress Notes (Signed)
 Physical Therapy Session Note  Patient Details  Name: Jerome Vaughn MRN: [id number] Date of Birth: Jun 14, 1951  Today's Date: 04/23/2024 PT Individual Time: 1310-1350 PT Individual Time Calculation (min): 40 min   Short Term Goals: Week 1:  PT Short Term Goal 1 (Week 1): Pt will perform functional transfers with CGA PT Short Term Goal 2 (Week 1): Pt will be able to perform 4 steps with rails with min A PT Short Term Goal 3 (Week 1): Pt will be able to perform gait x 150' with min A  Skilled Therapeutic Interventions/Progress Updates:    Chart reviewed and pt agreeable to therapy. Pt received semi-reclined with 3/10 c/o pain. Also of note, pt donned cervical collar t/o session. Session focused on functional transfer and amb endurance to promote return to home mobility. Pt initiated session with bed mobility, completing supine to sit from flat bed using S. Pt then completed amb of 163ft using CGA + PFRW. Pt then completed amb of 262ft + 214ft again with same assist. Pt completed BUE exercises between sets of fist opening and closing, wrist ext, and finger taps. Session education emphasized PFRW safety. At end of session, pt was left semi-reclined in bed with alarm engaged, nurse call bell and all needs in reach.     Therapy Documentation Precautions:  Precautions Precautions: Fall Recall of Precautions/Restrictions: Intact Precaution/Restrictions Comments: B rotator cuff tears Required Braces or Orthoses: Cervical Brace Cervical Brace: Soft collar, Other (comment) Other Brace: Can remove when in bed and when showering. May apply/remove when sitting Restrictions Weight Bearing Restrictions Per Provider Order: No General:      Therapy/Group: Individual Therapy  Jerome Vaughn 04/23/2024, 4:20 PM

## 2024-04-23 NOTE — Discharge Summary (Signed)
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  Donned shoes support hose and Ace wrap's.  Full family teaching completed plan discharge to home       Disposition:  There are no questions and answers to display.         Diet: Diabetic diet  Special Instructions: No driving smoking or alcohol  Cervical collar at all times  Medications at discharge. 1.  Tylenol  as needed 2.  ProAmatine  10 mg 3 times daily 3.  Lidoderm  patch change as directed 4.  Voltaren  gel 2 g twice daily as needed shoulder pain 5.  Colace 100 mg p.o. twice daily 6.  Zetia  10 mg p.o. daily 7.  Neurontin  300 mg p.o. 3 times daily as needed 8.  Robaxin  1000 mg every 8 hours as needed muscle spasms 9.  Protonix  40 mg p.o. twice daily 10.  Ranexa  500 mg p.o. twice daily 11.  MiraLAX  daily 12.  Flomax  0.4 mg p.o. daily after supper 13.  Tramadol  50 mg every 6 hours as needed pain 14.  Crestor  40 mg p.o. daily 15.  Glucophage  500 mg twice daily 16.  Ozempic  0.5 mg weekly 17.Strattera  20 mg daily 18.  Baclofen  5 mg 3  times daily 19.  Dulcolax suppository 10 mg daily as needed moderate constipation 20.  Florinef  0.1 mg daily 21.  Vitamin C 1 tablet daily 22.  Aspirin  81 mg p.o. daily 23.  Vitamin B12 1 tablet daily 24.  Ferrous sulfate  325 mg daily 25.  Multivitamin daily 26.  Nitroglycerin  as needed chest pain 27.  Crestor  40 mg daily 28.  Omega-3 1 capsule daily 29.  Dulcolax suppository daily as needed 30.  Florinef  0.1 mg daily  30-35 minutes were spent completing discharge summary and discharge planning Discharge Instructions     Ambulatory referral to Physical Medicine Rehab   Complete by: As directed    Moderate complexity follow-up 1 to 2 weeks Central cord syndrome/incomplete paraplegia        Follow-up Information     Lovorn, Duwaine, MD Follow up.   Specialty: Physical Medicine and Rehabilitation Why: Office to call for appointment Contact information: [address] [place] [address] [phone]         [name] MATSU, MD Follow up.   Specialty: Neurosurgery Why: Call for appointment Contact information: [address] [place] [address] [phone]         Jerome Garnette LABOR, MD Follow up.   Specialty: Family Medicine Why: Call for appointment Contact information: [address] [place] [address] [phone]         Jerome Norleen ONEIDA MADISON, MD Follow up.   Specialty: Hematology and Oncology Why: Call for appointment Contact information: [address] [place] [address] [phone]                 Signed: Toribio PARAS Shakura Vaughn 05/15/2024, 1:15 PM

## 2024-04-23 NOTE — Discharge Instructions (Addendum)
 Inpatient Rehab Discharge Instructions  Oneill Bais Yin Discharge date and time: No discharge date for patient encounter.   Activities/Precautions/ Functional Status: Activity: Cervical collar at all times Diet: Diabetic diet Wound Care: Routine skin checks Functional status:  ___ No restrictions     ___ Walk up steps independently ___ 24/7 supervision/assistance   ___ Walk up steps with assistance ___ Intermittent supervision/assistance  ___ Bathe/dress independently ___ Walk with walker     _x__ Bathe/dress with assistance ___ Walk Independently    ___ Shower independently ___ Walk with assistance    ___ Shower with assistance ___ No alcohol     ___ Return to work/school ________  Special Instructions:   COMMUNITY REFERRALS UPON DISCHARGE:    Outpatient: PT      OT              Agency: [facility]    Phone: [phone]              Appointment Date/Time: *Please expect follow-up within 7-10 business days to schedule your appointment. If you have not received follow-up, be sure to contact the site directly.*   Medical Equipment/Items Ordered:transport chair                                                 Agency/Supplier:[facility] [phone]    No driving smoking or alcohol  My questions have been answered and I understand these instructions. I will adhere to these goals and the provided educational materials after my discharge from the hospital.  Patient/Caregiver Signature _______________________________ Date __________  Clinician Signature _______________________________________ Date __________  Please bring this form and your medication list with you to all your follow-up doctor's appointments.   Information on my medicine - ELIQUIS  (apixaban )  This medication education was reviewed with me or my healthcare representative as part of my discharge preparation.  Why was Eliquis  prescribed for you? Eliquis  was prescribed for you to reduce the risk of blood  clots from forming.    What do You need to know about Eliquis ? Take your Eliquis  TWICE DAILY - one tablet in the morning and one tablet in the evening with or without food.  It would be best to take the dose about the same time each [place].  If you have difficulty swallowing the tablet whole please discuss with your pharmacist how to take the medication safely.  Take Eliquis  exactly as prescribed by your doctor and DO NOT stop taking Eliquis  without talking to the doctor who prescribed the medication.  Stopping without other medication to take the place of Eliquis  may increase your risk of developing a clot.  After discharge, you should have regular check-up appointments with your healthcare provider that is prescribing your Eliquis .  What do you do if you miss a dose? If a dose of ELIQUIS  is not taken at the scheduled time, take it as soon as possible on the same [place] and twice-daily administration should be resumed.  The dose should not be doubled to make up for a missed dose.  Do not take more than one tablet of ELIQUIS  at the same time.  Important Safety Information A possible side effect of Eliquis  is bleeding. You should call your healthcare provider right away if you experience any of the following: Bleeding from an injury or your nose that does not stop. Unusual colored  urine (red or dark brown) or unusual colored stools (red or black). Unusual bruising for unknown reasons. A serious fall or if you hit your head (even if there is no bleeding).  Some medicines may interact with Eliquis  and might increase your risk of bleeding or clotting while on Eliquis . To help avoid this, consult your healthcare provider or pharmacist prior to using any new prescription or non-prescription medications, including herbals, vitamins, non-steroidal anti-inflammatory drugs (NSAIDs) and supplements.  This website has more information on Eliquis  (apixaban ): http://www.eliquis .com/eliquis dena

## 2024-04-24 ENCOUNTER — Inpatient Hospital Stay (HOSPITAL_COMMUNITY)

## 2024-04-24 DIAGNOSIS — I10 Essential (primary) hypertension: Secondary | ICD-10-CM | POA: Diagnosis not present

## 2024-04-24 DIAGNOSIS — R739 Hyperglycemia, unspecified: Secondary | ICD-10-CM | POA: Diagnosis not present

## 2024-04-24 DIAGNOSIS — R059 Cough, unspecified: Secondary | ICD-10-CM

## 2024-04-24 DIAGNOSIS — G8250 Quadriplegia, unspecified: Secondary | ICD-10-CM | POA: Diagnosis not present

## 2024-04-24 DIAGNOSIS — K592 Neurogenic bowel, not elsewhere classified: Secondary | ICD-10-CM | POA: Diagnosis not present

## 2024-04-24 DIAGNOSIS — R0789 Other chest pain: Secondary | ICD-10-CM

## 2024-04-24 LAB — GLUCOSE, CAPILLARY
Glucose-Capillary: 160 mg/dL — ABNORMAL HIGH (ref 70–99)
Glucose-Capillary: 195 mg/dL — ABNORMAL HIGH (ref 70–99)
Glucose-Capillary: 182 mg/dL — ABNORMAL HIGH (ref 70–99)
Glucose-Capillary: 337 mg/dL — ABNORMAL HIGH (ref 70–99)

## 2024-04-24 LAB — CBC
HCT: 38.1 % — ABNORMAL LOW (ref 39.0–52.0)
Hemoglobin: 12.3 g/dL — ABNORMAL LOW (ref 13.0–17.0)
MCH: 28.9 pg (ref 26.0–34.0)
MCHC: 32.3 g/dL (ref 30.0–36.0)
MCV: 89.4 fL (ref 80.0–100.0)
Platelets: 121 K/uL — ABNORMAL LOW (ref 150–400)
RBC: 4.26 MIL/uL (ref 4.22–5.81)
RDW: 13.4 % (ref 11.5–15.5)
WBC: 5.6 K/uL (ref 4.0–10.5)
nRBC: 0 % (ref 0.0–0.2)

## 2024-04-24 LAB — TROPONIN I (HIGH SENSITIVITY): Troponin I (High Sensitivity): 7 ng/L (ref <18)

## 2024-04-24 MED ORDER — TAMSULOSIN HCL 0.4 MG PO CAPS
0.8000 mg | ORAL_CAPSULE | Freq: Every day | ORAL | Status: DC
  Administered 2024-04-24 – 2024-04-30 (×7): 0.8 mg via ORAL
  Filled 2024-04-24 (×7): qty 2

## 2024-04-24 NOTE — Progress Notes (Signed)
 Pt c/o chest pain.  States it's in the center of his chest.  It moves to the left and the right but it's in the center of his chest.  Called the on call, Mercedes.  Called rapid and performed EKG.  Made Dr. Babs aware.

## 2024-04-24 NOTE — Progress Notes (Addendum)
 Pt c/o CP 8/10 with radiation to left shoulder.  HR 64, 143/90 (107), RR 18 with sats 99 RA.  12 lead EKG reads SR with 1st degree block. No STE. Similar to EKG from 3/25.

## 2024-04-24 NOTE — Progress Notes (Signed)
 PROGRESS NOTE   Subjective/Complaints:  Pt doing ok again and slept well, but at 6am he began having some pain across his entire chest and somewhat into his neck-- felt different than his angina that he's previously had. This felt like being congested like when he's gotten sick before. Has a little bit of cough, mostly dry. But pain has improved, though not totally resolved, and he reiterates that it doesn't feel like his cardiac chest pain.  Pain well managed otherwise, LBM yesterday, peeing ok sometimes but doesn't fully empty when he tries and so he's still needing caths to urinate. No other complaints or concerns.   ROS:  Pt denies SOB, abd pain, CP, N/V/C/D, and vision changes As per HPI    Objective:   VAS US  LOWER EXTREMITY VENOUS (DVT) Result Date: 04/22/2024  Lower Venous DVT Study Patient Name:  Jerome Vaughn  Date of Exam:   04/22/2024 Medical Rec #: [id number]   Accession #:    [id number] Date of Birth: [birth year]  Patient Gender: M Patient Age:   73 years Exam Location:  [facility] Procedure:      VAS US  LOWER EXTREMITY VENOUS (DVT) Referring Phys: TORIBIO PITCH --------------------------------------------------------------------------------  Indications: Swelling.  Risk Factors: None identified. Limitations: Ambient room light. Comparison Study: No prior studies. Performing Technologist: Cordella Collet RVT  Examination Guidelines: A complete evaluation includes B-mode imaging, spectral Doppler, color Doppler, and power Doppler as needed of all accessible portions of each vessel. Bilateral testing is considered an integral part of a complete examination. Limited examinations for reoccurring indications may be performed as noted. The reflux portion of the exam is performed with the patient in reverse Trendelenburg.  +---------+---------------+---------+-----------+----------+--------------+ RIGHT     CompressibilityPhasicitySpontaneityPropertiesThrombus Aging +---------+---------------+---------+-----------+----------+--------------+ CFV      Full           Yes      Yes                                 +---------+---------------+---------+-----------+----------+--------------+ SFJ      Full                                                        +---------+---------------+---------+-----------+----------+--------------+ FV Prox  Full                                                        +---------+---------------+---------+-----------+----------+--------------+ FV Mid   Full                                                        +---------+---------------+---------+-----------+----------+--------------+ FV DistalFull                                                        +---------+---------------+---------+-----------+----------+--------------+  PFV      Full                                                        +---------+---------------+---------+-----------+----------+--------------+ POP      Full           Yes      Yes                                 +---------+---------------+---------+-----------+----------+--------------+ PTV      Full                                                        +---------+---------------+---------+-----------+----------+--------------+ PERO     Full                                                        +---------+---------------+---------+-----------+----------+--------------+   +---------+---------------+---------+-----------+----------+--------------+ LEFT     CompressibilityPhasicitySpontaneityPropertiesThrombus Aging +---------+---------------+---------+-----------+----------+--------------+ CFV      Full           Yes      Yes                                 +---------+---------------+---------+-----------+----------+--------------+ SFJ      Full                                                         +---------+---------------+---------+-----------+----------+--------------+ FV Prox  Full                                                        +---------+---------------+---------+-----------+----------+--------------+ FV Mid   Full                                                        +---------+---------------+---------+-----------+----------+--------------+ FV DistalFull                                                        +---------+---------------+---------+-----------+----------+--------------+ PFV      Full                                                        +---------+---------------+---------+-----------+----------+--------------+  POP      Full           Yes      Yes                                 +---------+---------------+---------+-----------+----------+--------------+ PTV      Full                                                        +---------+---------------+---------+-----------+----------+--------------+ PERO     Full                                                        +---------+---------------+---------+-----------+----------+--------------+     Summary: RIGHT: - There is no evidence of deep vein thrombosis in the lower extremity.  - No cystic structure found in the popliteal fossa.  LEFT: - There is no evidence of deep vein thrombosis in the lower extremity.  - No cystic structure found in the popliteal fossa.  *See table(s) above for measurements and observations. Electronically signed by Norman Serve on 04/22/2024 at 3:08:01 PM.    Final    Recent Labs    04/22/24 0522  WBC 3.5*  HGB 12.2*  HCT 37.6*  PLT 112*   Recent Labs    04/22/24 0522  NA 138  K 4.3  CL 105  CO2 24  GLUCOSE 169*  BUN 15  CREATININE 1.06  CALCIUM  9.1    Intake/Output Summary (Last 24 hours) at 04/24/2024 1148 Last data filed at 04/24/2024 1112 Gross per 24 hour  Intake 818 ml  Output 3000 ml  Net -2182 ml         Physical Exam: Vital Signs Blood pressure (!) 143/90, pulse 64, temperature 97.9 F (36.6 C), resp. rate 18, height 5' 10 (1.778 m), weight 84.6 kg, SpO2 99%.    General: awake, alert, appropriate, resting in bed; NAD HENT: conjugate gaze; oropharynx moist; wearing soft collar CV: regular rate and rhythm; no JVD Pulmonary: CTA B/L; no W/R/R- good air movement, very infrequent dry cough GI: soft, NT, ND, (+)BS- normoactive Psychiatric: appropriate- quiet Neurological: Ox3  PRIOR EXAMS: Genitourinary:    Comments: Foley with medium to light amber urine in bag- nurse removing it as we speak Musculoskeletal:        General: Normal range of motion.     Cervical back: Neck supple. No tenderness.     Comments: RUE- biceps 4-/5; triceps 4-/5; WE 3-/5; Grip 3-/5; FA 2-/5 LUE-  biceps 4/5; Triceps 4+/5; WE 4-/5; Grip 3+/5; FA 3+/5 RLE- HF 4/5, otherwise 4+/5  LLE- HF 4/5 otherwise 4+/5  Skin:    General: Skin is warm and dry.     Comments: Cervical incision looks good  Neurological:     Mental Status: He is alert.     Comments: Patient is alert and oriented x 3.  He does recall the event.  Follows simple commands. Pt Ox3; intact to light touch in all 4 extremities except R C5, feels super sensitive, but no reduced Sensation No clonus, no hoffman'sl no Increased tone  Assessment/Plan: 1. Functional  deficits which require 3+ hours per Matsuura of interdisciplinary therapy in a comprehensive inpatient rehab setting. Physiatrist is providing close team supervision and 24 hour management of active medical problems listed below. Physiatrist and rehab team continue to assess barriers to discharge/monitor patient progress toward functional and medical goals  Care Tool:  Bathing        Body parts bathed by helper: Right arm, Left arm, Chest, Abdomen, Front perineal area, Buttocks, Right upper leg, Left upper leg, Right lower leg, Left lower leg, Face     Bathing assist Assist Level:  Dependent - Patient 0%     Upper Body Dressing/Undressing Upper body dressing   What is the patient wearing?: Hospital gown only    Upper body assist Assist Level: Dependent - Patient 0%    Lower Body Dressing/Undressing Lower body dressing      What is the patient wearing?: Underwear/pull up, Pants     Lower body assist Assist for lower body dressing: Dependent - Patient 0%     Toileting Toileting    Toileting assist Assist for toileting: 2 Helpers     Transfers Chair/bed transfer  Transfers assist     Chair/bed transfer assist level: Minimal Assistance - Patient > 75%     Locomotion Ambulation   Ambulation assist      Assist level: Minimal Assistance - Patient > 75% Assistive device: Walker-rolling Max distance: 180   Walk 10 feet activity   Assist     Assist level: Minimal Assistance - Patient > 75% Assistive device: Walker-rolling   Walk 50 feet activity   Assist    Assist level: Minimal Assistance - Patient > 75% Assistive device: Walker-rolling    Walk 150 feet activity   Assist Walk 150 feet activity did not occur: Safety/medical concerns  Assist level: Minimal Assistance - Patient > 75% Assistive device: Walker-rolling    Walk 10 feet on uneven surface  activity   Assist     Assist level: Moderate Assistance - Patient - 50 - 74% Assistive device: Walker-rolling   Wheelchair     Assist Is the patient using a wheelchair?: Yes Type of Wheelchair: Manual    Wheelchair assist level: Total Assistance - Patient < 25% Max wheelchair distance: 10'    Wheelchair 50 feet with 2 turns activity    Assist        Assist Level: Total Assistance - Patient < 25%   Wheelchair 150 feet activity     Assist      Assist Level: Total Assistance - Patient < 25%   Blood pressure (!) 143/90, pulse 64, temperature 97.9 F (36.6 C), resp. rate 18, height 5' 10 (1.778 m), weight 84.6 kg, SpO2 99%.  Medical Problem  List and Plan: 1. 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  Status post C6-7 arthrodesis anterior body technique including discectomy for decompression of spinal cord and exiting nerve roots with foraminotomies.  Placement of intervertebral biomechanical device C6-7 04/19/2024 per Dr. Dorn Ned.  Cervical collar at all times             -patient may  shower -ELOS/Goals: ~ 3 weeks due to cental cord syndrome/incomplete C5 ASIA D  Con't CIR PT and OT  2.  Antithrombotics: -DVT/anticoagulation:  Mechanical: Antiembolism stockings, thigh (TED hose) Bilateral lower extremities.   7/25- Dopplers (-) - surgery was 7/22- 3 days ago- will wait  to start Lovneox until [place] 7.              -antiplatelet therapy: N/A 3. Pain Management: Neurontin  300 mg 3 times daily, Robaxin  1000 mg 3 times daily, oxycodone  as needed, Voltaren  gel 2 g twice daily as needed shoulder pain             -pt wants to stop Oxy and try Tramadol  prn -7/25- added Oxy to allergy list per pt request- added muscle rub prn for neck stiffness 4. Mood/Behavior/Sleep: Provide emotional support             -antipsychotic agents: N/A 5. Neuropsych/cognition: This patient is capable of making decisions on his own behalf. 6. Skin/Wound Care: Routine skin checks 7. Fluids/Electrolytes/Nutrition: Routine in and outs with follow-up chemistries 8.  Diabetes mellitus.  Hemoglobin A1c 6.4.  Currently on SSI.  Prior to admission patient on Glucophage  500 mg twice daily as well as Ozempic  weekly.  Resume as needed -7/26-27/25 CBGs variable, would suggest restarting metformin  if weekday team agrees.   CBG (last 3)  Recent Labs    04/23/24 2049 04/24/24 0603 04/24/24 1104  GLUCAP 206* 160* 195*    9.  Hyperlipidemia.  Zetia  10mg  daily, Crestor  40mg  daily 10.  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  Colace 100 mg twice daily, MiraLAX  daily  -7/25- LBM this Am after round- medium BM-was continent.  -04/24/24 LBM yesterday, monitor 11.  CAD/CABG 2017.  Ranexa  500 mg twice daily. 12.  Hypertension.  Blood pressures currently soft.  No lightheadedness- Prior to admission patient on Lotensin  20 mg daily, Coreg  3.125 mg twice daily.  Resume as needed 7/25- BP 150s systolic this AM- too early to be Autonomic dysreflexia (AD).  Will monitor trend -04/23/24 BPs very variable, monitor for another few days since restarting meds-- trend improving 7/27 Vitals:   04/21/24 1845 04/21/24 2021 04/22/24 0459 04/22/24 1442  BP: (!) 155/90 (!) 158/90 (!) 151/80 118/76   04/22/24 1600 04/22/24 1928 04/22/24 1951 04/23/24 0549  BP: 118/76 124/85 (!) 157/86 (!) 176/98   04/23/24 1307 04/23/24 1945 04/24/24 0453 04/24/24 0619  BP: 129/84 (!) 160/80 (!) 156/89 (!) 143/90    13. Neurogenic bladder- has foley- will remove in AM and cath if volumes >350cc q6 hours- with bladder scans scheduled q6 hours- will also start Flomax  0.4mg  nightly and monitor BP's 7/25- has voided x1 since foley removed- asked nursing to do bladder scan since has been 6 hours and do q6 hours for now to see if needs cathing- wasn't able to void before surgery, of note.  -04/23/24 cathing overnight/today, but pt wants to try to void on his own; monitor for now. Would increase flomax  if still unable by tomorrow.  -04/24/24 still needing caths, increase Flomax  0.8mg  nightly  14. GI PPx: protonix  40mg  BID  15. CP/cough: -04/24/24 had CP and dry cough this morning, doesn't feel like his prior angina, feels like congestion to him   -EKG reassuring this morning -ordered CBC, BMP, trop but not yet done-- will watch for results but low suspicion for emergent ACS at this time-- though does have hx of coronary blockages that aren't amendable to stents-- see Dr. Tyrone notes from 11/2023 -CXR done but not yet resulted-- nonacute  appearing to me, but will watch for results -doubt need for Resp panel now, but low threshold to order -could also be musculoskeletal -monitor  I spent >25mins performing patient care related activities, including  face to face time, documentation time, chart review, order/review of labs/imaging, med management, discussion of meds and CP/plans with patient and nursing staff, and overall coordination of care.     LOS: 3 days A FACE TO FACE EVALUATION WAS PERFORMED  [address] 04/24/2024, 11:48 AM

## 2024-04-25 ENCOUNTER — Telehealth: Payer: Self-pay | Admitting: Family Medicine

## 2024-04-25 DIAGNOSIS — G8250 Quadriplegia, unspecified: Secondary | ICD-10-CM | POA: Diagnosis not present

## 2024-04-25 LAB — GLUCOSE, CAPILLARY
Glucose-Capillary: 183 mg/dL — ABNORMAL HIGH (ref 70–99)
Glucose-Capillary: 220 mg/dL — ABNORMAL HIGH (ref 70–99)
Glucose-Capillary: 287 mg/dL — ABNORMAL HIGH (ref 70–99)
Glucose-Capillary: 217 mg/dL — ABNORMAL HIGH (ref 70–99)

## 2024-04-25 MED ORDER — BACLOFEN 5 MG HALF TABLET
5.0000 mg | ORAL_TABLET | Freq: Three times a day (TID) | ORAL | Status: DC
  Administered 2024-04-25 – 2024-04-26 (×3): 5 mg via ORAL
  Filled 2024-04-25 (×4): qty 1

## 2024-04-25 MED ORDER — METFORMIN HCL 500 MG PO TABS
500.0000 mg | ORAL_TABLET | Freq: Two times a day (BID) | ORAL | Status: DC
  Administered 2024-04-25 – 2024-04-28 (×6): 500 mg via ORAL
  Filled 2024-04-25 (×6): qty 1

## 2024-04-25 MED ORDER — ENOXAPARIN SODIUM 40 MG/0.4ML IJ SOSY
40.0000 mg | PREFILLED_SYRINGE | INTRAMUSCULAR | Status: DC
  Administered 2024-04-25 – 2024-05-15 (×24): 40 mg via SUBCUTANEOUS
  Filled 2024-04-25 (×22): qty 0.4

## 2024-04-25 NOTE — Care Management (Signed)
 Inpatient Rehabilitation Center Individual Statement of Services  Patient Name:  Jerome Vaughn  Date:  04/25/2024  Welcome to the Inpatient Rehabilitation Center.  Our goal is to provide you with an individualized program based on your diagnosis and situation, designed to meet your specific needs.  With this comprehensive rehabilitation program, you will be expected to participate in at least 3 hours of rehabilitation therapies Monday-Friday, with modified therapy programming on the weekends.  Your rehabilitation program will include the following services:  Physical Therapy (PT), Occupational Therapy (OT), 24 hour per [place]ricksen rehabilitation nursing, Therapeutic Recreaction (TR), Psychology, Neuropsychology, Care Coordinator, Rehabilitation Medicine, Nutrition Services, Pharmacy Services, and Other  Weekly team conferences will be held on Tuesday to discuss your progress.  Your Inpatient Rehabilitation Care Coordinator will talk with you frequently to get your input and to update you on team discussions.  Team conferences with you and your family in attendance may also be held.  Expected length of stay: 2-2.5 weeks    Overall anticipated outcome: Supervision  Depending on your progress and recovery, your program may change. Your Inpatient Rehabilitation Care Coordinator will coordinate services and will keep you informed of any changes. Your Inpatient Rehabilitation Care Coordinator's name and contact numbers are listed  below.  The following services may also be recommended but are not provided by the Inpatient Rehabilitation Center:  Driving Evaluations Home Health Rehabiltiation Services Outpatient Rehabilitation Services Vocational Rehabilitation   Arrangements will be made to provide these services after discharge if needed.  Arrangements include referral to agencies that provide these services.  Your insurance has been verified to be:  [employer]  Your primary doctor is:  Garnette Olmsted  Pertinent information will be shared with your doctor and your insurance company.  Inpatient Rehabilitation Care Coordinator:  Graeme Feliciana SILK [phone] or ([phone]  Information discussed with and copy given to patient by: Graeme DELENA Feliciana, 04/25/2024, 10:59 AM

## 2024-04-25 NOTE — Progress Notes (Signed)
 Occupational Therapy Session Note  Patient Details  Name: Jerome Vaughn MRN: [id number] Date of Birth: [birth year]  Today's Date: 04/25/2024 OT Individual Time: 1450-1520 OT Individual Time Calculation (min): 30 min    Short Term Goals: Week 1:  OT Short Term Goal 1 (Week 1): Pt will consistently direct care with family and staff regarding use/need of AE and orthotics for self feeding and/or daily tasks. OT Short Term Goal 2 (Week 1): Pt will completed UB dressing with Max A and use of compensatory techniques. OT Short Term Goal 3 (Week 1): Pt will completed simple grooming task such as brushing teeth with Max A and use of adaptive equipment  Skilled Therapeutic Interventions/Progress Updates:    Pt received in the w/c with no c/o pain, agreeable to OT session. Pt discussing limitations in self feeding with current wrist cock up splint. Recommended trial of u cuff and pt agreeable. Pt was taken via w/c to the therapy gym for time management. Trialed use of e-stim on pt's R dorsal wrist for wrist and finger extension strengthening. Pt able to complete AAROM for 10 minutes with flexion squeezes of foam block while unit ramped off. He worked on gross grasp, progressing to modified pincer with small modified foam blocks for increased dexterity needed for self feeding and managing buttons on clothing. He returned to his room. Stand pivot back to bed with the RW with close (S). He was left supine with all needs met, bed alarm set.   NMES Settings Ratio 1:3 Rate 35 pps Waveform- Asymmetric Ramp 1.0 Pulse 300 Intensity- 26 Duration -  10 min  Report of pain at the beginning of session 0/10 Report of pain at the end of session 0/10  No adverse reactions after treatment and is skin intact.     Therapy Documentation Precautions:  Precautions Precautions: Fall Recall of Precautions/Restrictions: Intact Precaution/Restrictions Comments: B rotator cuff tears Required Braces or Orthoses:  Cervical Brace Cervical Brace: Soft collar, Other (comment) Other Brace: Can remove when in bed and when showering. May apply/remove when sitting Restrictions Weight Bearing Restrictions Per Provider Order: No  Therapy/Group: Individual Therapy  Nena VEAR Moats 04/25/2024, 7:51 AM

## 2024-04-25 NOTE — Progress Notes (Signed)
 Occupational Therapy Session Note  Patient Details  Name: Jerome Vaughn MRN: [id number] Date of Birth: May 09, 1951  Today's Date: 04/25/2024 OT Individual Time: 9054-8954 OT Individual Time Calculation (min): 60 min    Short Term Goals: Week 1:  OT Short Term Goal 1 (Week 1): Pt will consistently direct care with family and staff regarding use/need of AE and orthotics for self feeding and/or daily tasks. OT Short Term Goal 2 (Week 1): Pt will completed UB dressing with Max A and use of compensatory techniques. OT Short Term Goal 3 (Week 1): Pt will completed simple grooming task such as brushing teeth with Max A and use of adaptive equipment  Skilled Therapeutic Interventions/Progress Updates:     Pt received in bed and agreeable to trying a shower. Set up bathroom for shower. Pt sat to EOB with min-mod A and once sitting felt a little woozy but able to sit and once symptoms subsided, pt rose to stand and practiced standing 1x.  Pt stood at second time to prepare to stand but became hypotensive with eyes rolling back. Pt quickly positioned back to EOB with mod A. Once pt able to sit safely, took vitals 102/71.  Pt continued to feel woozy so opted to do a sponge bathe. Bathed UB EOB and donned shirt max A, then moved to supine as pt feeling more light headed.   From supine, completed LB bathing and dressing with total A and used L hand to partially pull up pants at hips.  From bed in neutral in supine, A/arom of RUE with good tolerance to movement. Pt able to tolerate 100 degrees of shoulder flexion PROM and then was able to do A/arom sh flex to 90 with no pain.  Resisted sh extension with pt pushing arm down into mattress. With arm by side full elbow flex and extension. Supported overhead arm extension with a/arom for tricep extension. Pt was tolerating touch to shoulder and did well with ROM exercises stating it was making his shoulder feel better. Pt resting in bed with all needs met. Alarm  set.     Therapy Documentation Precautions:  Precautions Precautions: Fall Recall of Precautions/Restrictions: Intact Precaution/Restrictions Comments: B rotator cuff tears Required Braces or Orthoses: Cervical Brace Cervical Brace: Soft collar, Other (comment) Other Brace: Can remove when in bed and when showering. May apply/remove when sitting Restrictions Weight Bearing Restrictions Per Provider Order: No Pain: C/o R sh pain, premedicated, improved with ROM exercises    Therapy/Group: Individual Therapy  Kennidee Heyne 04/25/2024, 11:42 AM

## 2024-04-25 NOTE — Progress Notes (Signed)
 Pt refused all night time medications. Pt stated Im new to taking all of these medication, when is the next time scheduled medications, I do not want to take those? Educated patient on the scheduled times for these medications and the frequency doses. Pt still refused medications. Pt agreed to take night time insulin . Call bell in reach, no concerns at this time.  Bobetta Gammon, LPN

## 2024-04-25 NOTE — Progress Notes (Addendum)
 Patient ID: Jerome Vaughn, male   DOB: [birth year], 73 y.o.   MRN: [id number]  SW faxed FMLA forms to [employer] #[phone]/[employer]#[id number] .  SW provided pt with completed FMLA forms.  [id number] left message for pt wife Jerome Vaughn informing on above and will follow up with updates after team conference.   Graeme Jude, MSW, LCSW Office: [phone] Cell: [phone] Fax: [phone]

## 2024-04-25 NOTE — Telephone Encounter (Signed)
 Copied from CRM [id number]. Topic: General - Other >> Apr 25, 2024  8:42 AM Avram MATSU wrote: Reason for CRM: pt wants to notify his provider about his recent hospital visit and he is now at the [facility] rehabilitation center.
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
## 2024-04-25 NOTE — Progress Notes (Signed)
 Physical Therapy Session Note  Patient Details  Name: Jerome Vaughn MRN: [id number] Date of Birth: 08-14-1951  Today's Date: 04/25/2024 PT Individual Time: 1100-1200 PT Individual Time Calculation (min): 60 min   Short Term Goals: Week 1:  PT Short Term Goal 1 (Week 1): Pt will perform functional transfers with CGA PT Short Term Goal 2 (Week 1): Pt will be able to perform 4 steps with rails with min A PT Short Term Goal 3 (Week 1): Pt will be able to perform gait x 150' with min A  Skilled Therapeutic Interventions/Progress Updates:    pt received in bed and agreeable to therapy. Pt reports pain controlled at this time, premedicated. Rest and positioning provided as needed. Supine>sit with min a HHA for steadying EOB. Minor dizziness with change of position but resolved after sitting several minutes. Monitored symptoms throughout. Pt was able to use BLE movement and deep breathing to assist in venous return to manage orthostatic dizziness.   Stand pivot transfer with RW with hand splint with min a, cues for upright posture and assist for RW management. Pt transported to therapy gym for time management and energy conservation. Pt then ambulated ~210 ft with min a overall, but LOB while turning to sit to mat table requiring mod a for safety. Noted mildly ataxic gait with some spasticity related stiffness, but improved with repetition.   Pt then participated in cone taps progression with min to mod a using cones of multiple heights for coordination and balance. Pt then reports urge for BM. Stand pivot transfer to w/c and then to commode with min a overall using RW. Pt with small smear in brief and continent BM, required max a for 3/3 toileting tasks. Documented in flow sheet.  Pt returned to EOB and performed Sit to stand x 10 with L HHA only and cues for full hip extension and incr time to find balance. Pt returned to w/c and was left with all needs in reach.   Therapy Documentation Precautions:   Precautions Precautions: Fall Recall of Precautions/Restrictions: Intact Precaution/Restrictions Comments: B rotator cuff tears Required Braces or Orthoses: Cervical Brace Cervical Brace: Soft collar, Other (comment) Other Brace: Can remove when in bed and when showering. May apply/remove when sitting Restrictions Weight Bearing Restrictions Per Provider Order: No General:       Therapy/Group: Individual Therapy  [name] 04/25/2024, 11:35 AM
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
## 2024-04-26 LAB — COMPREHENSIVE METABOLIC PANEL WITH GFR
ALT: 47 U/L — ABNORMAL HIGH (ref 0–44)
AST: 36 U/L (ref 15–41)
Albumin: 3.4 g/dL — ABNORMAL LOW (ref 3.5–5.0)
Alkaline Phosphatase: 44 U/L (ref 38–126)
Anion gap: 11 (ref 5–15)
BUN: 23 mg/dL (ref 8–23)
CO2: 22 mmol/L (ref 22–32)
Calcium: 9.5 mg/dL (ref 8.9–10.3)
Chloride: 102 mmol/L (ref 98–111)
Creatinine, Ser: 1.13 mg/dL (ref 0.61–1.24)
GFR, Estimated: >60 mL/min (ref >60)
Glucose, Bld: 173 mg/dL — ABNORMAL HIGH (ref 70–99)
Potassium: 4.1 mmol/L (ref 3.5–5.1)
Sodium: 135 mmol/L (ref 135–145)
Total Bilirubin: 0.6 mg/dL (ref 0.0–1.2)
Total Protein: 8 g/dL (ref 6.5–8.1)

## 2024-04-26 LAB — CBC WITH DIFFERENTIAL/PLATELET
Abs Immature Granulocytes: 0.03 K/uL (ref 0.00–0.07)
Basophils Absolute: 0 K/uL (ref 0.0–0.1)
Basophils Relative: 0 %
Eosinophils Absolute: 0 K/uL (ref 0.0–0.5)
Eosinophils Relative: 1 %
HCT: 38.7 % — ABNORMAL LOW (ref 39.0–52.0)
Hemoglobin: 12.7 g/dL — ABNORMAL LOW (ref 13.0–17.0)
Immature Granulocytes: 0 %
Lymphocytes Relative: 15 %
Lymphs Abs: 1 K/uL (ref 0.7–4.0)
MCH: 29.2 pg (ref 26.0–34.0)
MCHC: 32.8 g/dL (ref 30.0–36.0)
MCV: 89 fL (ref 80.0–100.0)
Monocytes Absolute: 0.9 K/uL (ref 0.1–1.0)
Monocytes Relative: 13 %
Neutro Abs: 5.1 K/uL (ref 1.7–7.7)
Neutrophils Relative %: 71 %
Platelets: 128 K/uL — ABNORMAL LOW (ref 150–400)
RBC: 4.35 MIL/uL (ref 4.22–5.81)
RDW: 13.5 % (ref 11.5–15.5)
WBC: 7.1 K/uL (ref 4.0–10.5)
nRBC: 0 % (ref 0.0–0.2)

## 2024-04-26 LAB — GLUCOSE, CAPILLARY
Glucose-Capillary: 194 mg/dL — ABNORMAL HIGH (ref 70–99)
Glucose-Capillary: 223 mg/dL — ABNORMAL HIGH (ref 70–99)
Glucose-Capillary: 241 mg/dL — ABNORMAL HIGH (ref 70–99)

## 2024-04-26 MED ORDER — BENAZEPRIL HCL 5 MG PO TABS
5.0000 mg | ORAL_TABLET | Freq: Every day | ORAL | Status: DC
  Administered 2024-04-27: 5 mg via ORAL
  Filled 2024-04-26: qty 1

## 2024-04-26 MED ORDER — BACLOFEN 10 MG PO TABS
5.0000 mg | ORAL_TABLET | Freq: Three times a day (TID) | ORAL | Status: DC
  Administered 2024-04-26 – 2024-05-17 (×72): 5 mg via ORAL
  Filled 2024-04-26 (×66): qty 1

## 2024-04-26 MED ORDER — METHOCARBAMOL 500 MG PO TABS
1000.0000 mg | ORAL_TABLET | Freq: Three times a day (TID) | ORAL | Status: DC | PRN
  Administered 2024-04-30 – 2024-05-11 (×7): 1000 mg via ORAL
  Filled 2024-04-26 (×6): qty 2

## 2024-04-26 NOTE — Progress Notes (Signed)
 Occupational Therapy Session Note  Patient Details  Name: Jerome Vaughn MRN: [id number] Date of Birth: Apr 06, 1951  Today's Date: 04/26/2024 OT Individual Time: 1430-1545 OT Individual Time Calculation (min): 75 min    Short Term Goals: Week 1:  OT Short Term Goal 1 (Week 1): Pt will consistently direct care with family and staff regarding use/need of AE and orthotics for self feeding and/or daily tasks. OT Short Term Goal 2 (Week 1): Pt will completed UB dressing with Max A and use of compensatory techniques. OT Short Term Goal 3 (Week 1): Pt will completed simple grooming task such as brushing teeth with Max A and use of adaptive equipment  Skilled Therapeutic Interventions/Progress Updates:      Therapy Documentation Precautions:  Precautions Precautions: Fall Recall of Precautions/Restrictions: Intact Precaution/Restrictions Comments: B rotator cuff tears Required Braces or Orthoses: Cervical Brace Cervical Brace: Soft collar, Other (comment) Other Brace: Can remove when in bed and when showering. May apply/remove when sitting Restrictions Weight Bearing Restrictions Per Provider Order: No General:  Pt supine in bed upon OT arrival, agreeable to OT session. Pt reported feeling better this afternoon-no dizziness reported with positional changes. Pt presenting with thigh high TEDs and ACE wrap. Wife present  Vital Signs: OT taking orthostatics upon arrival Supine-165/89 EOB-141-86 Standing-95/72  Pain: 4/10 pain reported in neck, activity, intermittent rest breaks, distractions provided for pain management, pt reports tolerable to proceed.   ADL: OT providing skilled intervention on ADL retraining in order to increase independence with tasks and increase activity tolerance. Pt completed the following tasks at the current level of assist: Bed mobility: SBA supine><EOB with HOB raised Eating: pt wife showing OT adaptive built up utensils for pt to use for feeding. Pt demo using  with increased independence. Pt able to grasp blue cup and self feed  Toilet transfer: CGA with RW ambulating from bed><BSC over toilet Toileting: Min A, pt able to manage pants over waist with increased time d/t decreased sensation in hands, required assistance with managing hygiene after BM, wife actively assisting with toileting with safe techniques, continent of B&B void in toilet  Transfers: CGA-Min A without RW for 30 ft no LOB, slight truncal ataxia during transfers  Other Treatments: OT providing therapeutic use of self in order to build rapport and discuss patient current situation and goals for therapy.  OT educating pt on B&B process/routines. Pt appreciative of information. Pt curious about u-cuff vs wrist stabilizing u-cuff, OT demonstrating and discussing difference.    Pt seated in W/C at end of session with W/C alarm donned, call light within reach and 4Ps assessed. Wife present   Therapy/Group: Individual Therapy  Camie Hoe, OTD, OTR/L 04/26/2024, 4:13 PM

## 2024-04-26 NOTE — Progress Notes (Signed)
 Physical Therapy Session Note  Patient Details  Name: Jerome Vaughn MRN: [id number] Date of Birth: [birth year]  Today's Date: 04/26/2024 PT Individual Time: 1300-1400 PT Individual Time Calculation (min): 60 min   Short Term Goals: Week 1:  PT Short Term Goal 1 (Week 1): Pt will perform functional transfers with CGA PT Short Term Goal 2 (Week 1): Pt will be able to perform 4 steps with rails with min A PT Short Term Goal 3 (Week 1): Pt will be able to perform gait x 150' with min A  Skilled Therapeutic Interventions/Progress Updates:    pt received in bed and agreeable to therapy. No complaint of pain. Pt reports feeling much better this session vs earlier in the [place]. Teds and ace wraps in place throughout session. Pt with sat EOB with supervision and denies dizziness or other orthostatic symptoms. Pt stood to RW with CGA and BP found to be 93/61(70). Pt then ambulated to bathroom with min a and RW d/t pt reporting urge for BM and bladder void (no void throughout session despite extended time and opportunity). Once seated on commode, pt began to report symptoms, BP=71/58(65). Pt able to move BLE for incr venous return with little success. Suddenly, pt noted to be listing to side with glazed expression. Pt aroused quickly and reported I felt like I was thinking about something and then spaced out. Pt also notes visual disturbances and difficulty focusing eyes on target. Symptoms improved with legs elevated and ankle pumps. Possible that pt had decr venous return d/t height of BSC as pt was better able to maintain asymptomatic BP seated EOB. Pt returned to EOB with Stand pivot transfer BSC>w/c>EOB with min a and RW. Pt able to maintain EOB while performing seated marches, LAQ, and ankle pumps for venous return for several minutes while talking with CSW. Pt later returned to supine with max a for LE management. Pt then participated in glute bridges for BLE strength, pt noted continued improvement in  symptoms and BP with supine exercise. BP found to be 128/75 (92) by end of session. Pt remained in bed and was left with all needs in reach and alarm active.   Therapy Documentation Precautions:  Precautions Precautions: Fall Recall of Precautions/Restrictions: Intact Precaution/Restrictions Comments: B rotator cuff tears Required Braces or Orthoses: Cervical Brace Cervical Brace: Soft collar, Other (comment) Other Brace: Can remove when in bed and when showering. May apply/remove when sitting Restrictions Weight Bearing Restrictions Per Provider Order: No General:       Therapy/Group: Individual Therapy  Jerome Vaughn 04/26/2024, 4:40 PM

## 2024-04-26 NOTE — Progress Notes (Signed)
 Physical Therapy Session Note  Patient Details  Name: Jerome Vaughn MRN: [id number] Date of Birth: 18-May-1951  Today's Date: 04/26/2024 PT Individual Time: 1000-1053 PT Individual Time Calculation (min): 53 min   Short Term Goals: Week 1:  PT Short Term Goal 1 (Week 1): Pt will perform functional transfers with CGA PT Short Term Goal 2 (Week 1): Pt will be able to perform 4 steps with rails with min A PT Short Term Goal 3 (Week 1): Pt will be able to perform gait x 150' with min A  Skilled Therapeutic Interventions/Progress Updates:    Pt reports being limited this morning by low BP with other PT session. Pt presents in bed and vitals assessed. Donned THT and ACE wraps to BLE and then performed supine heel slides/hip flexion, hip abduction and ankle pumps x 10 reps each BLE for improving circulation. See values below for monitoring throughout session. Pt does complain of feeling woozy and at times experiences a weird feeling like I am outside of my body. Agreeable to attempt EOB activities. Performed supine to sit with CGA overall and scooted forward with supervision for balance. Performed seated therex (LAQ with hold) for functional strengthening, balance, cues for core activation and monitoring BP. Symptoms remained present but did not become exacerbated and pt able to tolerate about 10 min. Pt does report not having urge to urinate this morning - relayed to nursing as pt reports normally he does have the sensation and has not changed the amount of fluids he has taken in this morning. Planning for bladder scan at 11:30 per nursing. Returned patient to supine with supervision and scooted along EOB prior with supervision - decreased balance during scooting but able to correct with UE support. Repositioned in bed with all needs in reach.  Information reported by patient relayed to nursing and MD during session.  Therapy Documentation Precautions:  Precautions Precautions: Fall Recall of  Precautions/Restrictions: Intact Precaution/Restrictions Comments: B rotator cuff tears Required Braces or Orthoses: Cervical Brace Cervical Brace: Soft collar, Other (comment) Other Brace: Can remove when in bed and when showering. May apply/remove when sitting Restrictions Weight Bearing Restrictions Per Provider Order: No    Vital Signs: HOB elevated BP = 100/65 mmHg; HR = 64 bpm asymptomatic BP = 116/69 mmHg; HR = 67 bpm with ACE wraps and Thigh high tedhose BP = 105/66 mmHg; HR = 75 bpm after BLE therex for improved circulation; mild symptoms BP =83/60 mmHg; seated EOB BP = 92/65 mmhg: HR = 80 bpm during therex EOB - symptoms remained the same BP = 97/65 mmHg; seated EOB after exercises - symptoms remained the same   Pain: Premedicated. Reports some shoulder discomfort but nothing acute    Therapy/Group: Individual Therapy  [name] Elnor, PT, DPT, CBIS  04/26/2024, 11:34 AM

## 2024-04-26 NOTE — Progress Notes (Signed)
 Physical Therapy Session Note  Patient Details  Name: Jerome Vaughn MRN: [id number] Date of Birth: 02-12-1951  Today's Date: 04/26/2024 PT Individual Time: 0900-0945 PT Individual Time Calculation (min): 45 min   Short Term Goals: Week 1:  PT Short Term Goal 1 (Week 1): Pt will perform functional transfers with CGA PT Short Term Goal 2 (Week 1): Pt will be able to perform 4 steps with rails with min A PT Short Term Goal 3 (Week 1): Pt will be able to perform gait x 150' with min A  Skilled Therapeutic Interventions/Progress Updates:      Patient was supine and agreeable to therapy. Pt was symptomatic in supine to sit, reporting seeing flashes and being unable to focus visually on an object. Pt reported severe dizziness, but denied spinning sensation. Pt performed seated marching x20 B to address low BP and improve hip flexor endurance. Pt performed STS w/ minA and RW to improve functional activity tolerance but was unable to remain standing due to increase in dizziness symptoms. Pt returned to supine to alleviate symptoms and orthostatics were taken (orthostatic hypotension positive). In standing, patient was visibly pale which correlates to low BP. Vitals reported below. Pt reported a lack of feeling/strength everywhere below [his] neck. Pt stated he didn't want to say the P word (paralyzed) but he felt he was unable to move/control his limbs very well.   Pt returned to supine in bed after orthostatics and performed supine clamshells x10 reps w/ green TB to improve hip abduction strength and balance. RN notified of orthostatics at end of session and handoff performed to RN in pt room. Pt left supine in bed w/ call bell and all needs in reach and bed alarm on.   Therapy Documentation Precautions:  Precautions Precautions: Fall Recall of Precautions/Restrictions: Intact Precaution/Restrictions Comments: B rotator cuff tears Required Braces or Orthoses: Cervical Brace Cervical Brace:  Soft collar, Other (comment) Other Brace: Can remove when in bed and when showering. May apply/remove when sitting Restrictions Weight Bearing Restrictions Per Provider Order: No General:   Vital Signs:   Orthostatics - Supine: 107/65 (79), 66bpm - Seated: 79/54 (62), 80bpm - Standing: 60/51 (56), 84bpm - Returned to supine: 117/71 (85), 62bpm  Pain: Pain Assessment Pain Scale: 0-10 Pain Score: 0-No pain Pain Location: Other (Comment)   Therapy/Group: Individual Therapy  [name] 04/26/2024, 12:30 PM

## 2024-04-26 NOTE — Progress Notes (Signed)
 Patient ID: Jerome Vaughn, male   DOB: Aug 12, 1951, 73 y.o.   MRN: [id number]  SW met with pt and pt wife during PT session to provide updates from team conference, d/c date 8/14, and discuss family meeting. Fam meeting scheduled for this Thursday at 11am.   Graeme Jude, MSW, LCSW Office: [phone] Cell: [phone] Fax: [phone]
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
## 2024-04-27 DIAGNOSIS — G8250 Quadriplegia, unspecified: Secondary | ICD-10-CM | POA: Diagnosis not present

## 2024-04-27 LAB — GLUCOSE, CAPILLARY
Glucose-Capillary: 182 mg/dL — ABNORMAL HIGH (ref 70–99)
Glucose-Capillary: 206 mg/dL — ABNORMAL HIGH (ref 70–99)
Glucose-Capillary: 228 mg/dL — ABNORMAL HIGH (ref 70–99)
Glucose-Capillary: 221 mg/dL — ABNORMAL HIGH (ref 70–99)
Glucose-Capillary: 231 mg/dL — ABNORMAL HIGH (ref 70–99)
Glucose-Capillary: 210 mg/dL — ABNORMAL HIGH (ref 70–99)

## 2024-04-27 MED ORDER — GABAPENTIN 300 MG PO CAPS
300.0000 mg | ORAL_CAPSULE | Freq: Three times a day (TID) | ORAL | Status: DC | PRN
  Administered 2024-04-30: 300 mg via ORAL
  Filled 2024-04-27: qty 1
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
## 2024-04-27 NOTE — Progress Notes (Signed)
 RN provided family education on in and out catheterization, including indications, steps of the procedure, hand hygiene, and signs of infection. Wife verbalized understanding. She did not want to participate in the actual procedure this time, but agreed to try tomorrow.
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
## 2024-04-27 NOTE — Progress Notes (Signed)
 Occupational Therapy Session Note  Patient Details  Name: Jerome Vaughn MRN: [id number] Date of Birth: [birth year]  Today's Date: 04/27/2024 OT Individual Time: 0832-0950 OT Individual Time Calculation (min): 78 min    Short Term Goals: Week 1:  OT Short Term Goal 1 (Week 1): Pt will consistently direct care with family and staff regarding use/need of AE and orthotics for self feeding and/or daily tasks. OT Short Term Goal 2 (Week 1): Pt will completed UB dressing with Max A and use of compensatory techniques. OT Short Term Goal 3 (Week 1): Pt will completed simple grooming task such as brushing teeth with Max A and use of adaptive equipment  Skilled Therapeutic Interventions/Progress Updates:  Pt greeted supine in bed with bed at 14*, pt agreeable to OT intervention but reports rough AM and PM d/t BP issues.   ADLs:  LB dressing: donned teds/ace wraps with total A, donned pants from bed level with pt able to bridge needing MAX A overall   worked on upright tolerance and BP mgmt by transitioning pt slowly into chair position with pt elevated 10* with BP assessed at each angle change.   14* 123/73( 89) HR 75 24* 116/71( 86) HR 75 Pt completed ankle pumps and straight leg raises at this height for 3 mins with pt then elevated 10 more degrees 34* 121/72( 86) HR 79  44* 121/72( 88) HR 76  54* 116/74( 87) HR 79  Pt completed x20 LAQS on each BLEs to increase venous flow for BP mgmt, Bp then reassessed- 110/72(84) HR 79   Worked at 51* with pt completing below therex- Pt instructed to position BUEs on bed rails with pt instructed to slide BUEs up/down on bed rails to provide closed chain ROM therex to improve elbow flexion/extension and scapular protraction/retraction.  Graded task up and instructed pt to use bed rails to pull trunk off bed, pt completed x10 reps with supervision.  BP-  98/ 64( 75) HR 83   Continued to work at this height with pt using RUE to transport small blocks from  table to cup with a focus on improving FMC.   BP- 111/72(84) HR 75  Pt completed seated peg board task at this height with pt able to remove pegs from board with + time but overall supervision.   Ended session with BP 118/81( 94) HR 79   Pt left in bed with all needs within reach and bed alarm activated.    Therapy Documentation Precautions:  Precautions Precautions: Fall Recall of Precautions/Restrictions: Intact Precaution/Restrictions Comments: B rotator cuff tears Required Braces or Orthoses: Cervical Brace Cervical Brace: Soft collar, Other (comment) Other Brace: Can remove when in bed and when showering. May apply/remove when sitting Restrictions Weight Bearing Restrictions Per Provider Order: No  Pain: Unrated pain reported in bil shoulders, rest breaks provided as needed.    Therapy/Group: Individual Therapy  Ronal Mallie Needy 04/27/2024, 12:10 PM

## 2024-04-27 NOTE — Progress Notes (Signed)
 Pt was on the [facility] while tech was helping. Care nurse called as patient was c/o dizziness and feeling of passing out. BP was 76/53 (MAP 61) HR 85, all other VS at baseline.Pt had right sided gazed expression for 2-3 seconds, not responding to command. This care RN called for help to get patient back in bed and called rapid to evaluation. Once patient was in bed in flat position, patient back to baseline. Recheck of BP 150/93 (MAP 111) HR 69.

## 2024-04-27 NOTE — Progress Notes (Addendum)
 Physical Therapy Session Note  Patient Details  Name: Jerome Vaughn MRN: [id number] Date of Birth: Oct 30, 1950  Today's Date: 04/27/2024 PT Individual Time: 1005-1108 and 1350-1450 PT Individual Time Calculation (min): 63 min and 60 min  Short Term Goals: Week 1:  PT Short Term Goal 1 (Week 1): Pt will perform functional transfers with CGA PT Short Term Goal 2 (Week 1): Pt will be able to perform 4 steps with rails with min A PT Short Term Goal 3 (Week 1): Pt will be able to perform gait x 150' with min A  Skilled Therapeutic Interventions/Progress Updates:Tx1:  Pt presented in bed agreeable to therapy. Pt c/o increased pain/spasm in B shoulders as pt recently finished with O, bed at 56 degrees). PTA obtained hot pack and placed over shoulders as BP assessed (118/78 (92) HR 72 and performed AROM of BLE for increased cardiovascular recruitment. Performed ankle pumps, SAQ (bed in chair position), hip abd/add x 20 bilaterally. BP checked again 93/65 HR 83 with pt mildly symptomatic. Continued activities bed level including manually resisted leg press, SLR, and heel slides. Pt then completed supine to sit with minA for truncal support due to shoulder pain. At EOB pt mildly symptomatic initially with BP 95/63 (73) HR 87. Pt able to complete LAQ and seated marches x 15 bilaterally however pt becoming more symptomatic with PTA unable to assess BP prior to transferring back to supine with modA. Pt was able to roll with supervision for pad adjustment, then performed bridges to scoot anteriorly to [facility]. Pt repositioned to comfort and left in bed with call bell within reach and current needs met.   Tx2: Pt presented in bed with wife present agreeable to therapy. Pt c/o unrated pain in shoulders and forearms, no intervention requested at this time. Noted abdominal binder in room therefore session focused on BP monitoring and upright tolerance. BP assessed as noted below with RN notified of BP. Discussed increasing  fluid intake to assist with BP management in additional med management provided by MD. Abdominal binder donned in supine with pt rolling with supervision and use of bed rail. Pt performed various therex through different positions with pt noting limited tolerance in more upright positioning due to both pain and hypotensive symptoms. Pt performed ankle pumps, heel slides, hip abd/add, SLE with 2.5lb weights, clamshells with red resistance band. Also performed elbow flexion/extension, and AA chest press with boom stick. PTA also provided education on when pt can doff soft collar (indicated had received a few different parameters). Instructed can remove when in bed and while taking shower.  Performed supine to sit with CGA and increased time however pt was unable to tolerate sitting EOB long enough to see if symptoms would subside therefore provided minA to return to supine. Like earlier session pt able to perform bridge to scoot to [facility]. Pt repositioned to comfort and left in bed at end of session with call bell within reach and needs met.   Supine 108/64 (78) HOB 35 degrees 91/59 (69) HR 75 HOB 45 degrees 79/59 (66) HR 79 HOB 45 degrees after therex 87/63 (71) HR 78 HOB 45 degrees after additional therex (approx 8 min total in this position) 87/67 (75) HR 72 EOB 0 min 83/53 (61) HR 82 becoming more symptomatic within 1 min     Therapy Documentation Precautions:  Precautions Precautions: Fall Recall of Precautions/Restrictions: Intact Precaution/Restrictions Comments: B rotator cuff tears Required Braces or Orthoses: Cervical Brace Cervical Brace: Soft collar, Other (comment) Other Brace:  Can remove when in bed and when showering. May apply/remove when sitting Restrictions Weight Bearing Restrictions Per Provider Order: No General:   Vital Signs: Therapy Vitals Temp: 98.3 F (36.8 C) Pulse Rate: 69 Resp: 18 BP: 119/66 Patient Position (if appropriate): Lying Oxygen Therapy SpO2: 94  % O2 Device: Room Air    Therapy/Group: Individual Therapy  Alva Broxson 04/27/2024, 12:50 PM

## 2024-04-27 NOTE — Progress Notes (Signed)
 Orthopedic Tech Progress Note Patient Details:  Jerome Vaughn October 13, 1950 [id number] 12 inch universal abdominal binder was delivered to bedside per PT request.  Patient ID: Jerome Vaughn, male   DOB: [birth year], 73 y.o.   MRN: [id number]  Jerome Vaughn 04/27/2024, 1:53 PM

## 2024-04-28 ENCOUNTER — Inpatient Hospital Stay (HOSPITAL_COMMUNITY)

## 2024-04-28 ENCOUNTER — Telehealth: Payer: Self-pay | Admitting: Hematology and Oncology

## 2024-04-28 DIAGNOSIS — G8250 Quadriplegia, unspecified: Secondary | ICD-10-CM | POA: Diagnosis not present

## 2024-04-28 LAB — URINALYSIS, W/ REFLEX TO CULTURE (INFECTION SUSPECTED)
Bilirubin Urine: NEGATIVE
Glucose, UA: NEGATIVE mg/dL
Ketones, ur: NEGATIVE mg/dL
Nitrite: NEGATIVE
Protein, ur: 30 mg/dL — AB
Specific Gravity, Urine: 1.009 (ref 1.005–1.030)
WBC, UA: >50 WBC/hpf (ref 0–5)
pH: 5 (ref 5.0–8.0)

## 2024-04-28 LAB — GLUCOSE, CAPILLARY
Glucose-Capillary: 201 mg/dL — ABNORMAL HIGH (ref 70–99)
Glucose-Capillary: 216 mg/dL — ABNORMAL HIGH (ref 70–99)
Glucose-Capillary: 230 mg/dL — ABNORMAL HIGH (ref 70–99)
Glucose-Capillary: 165 mg/dL — ABNORMAL HIGH (ref 70–99)

## 2024-04-28 MED ORDER — LIDOCAINE HCL 1 % IJ SOLN
10.0000 mL | Freq: Once | INTRAMUSCULAR | Status: AC
  Administered 2024-04-28: 10 mL
  Filled 2024-04-28: qty 10

## 2024-04-28 MED ORDER — MIDODRINE HCL 5 MG PO TABS
5.0000 mg | ORAL_TABLET | Freq: Three times a day (TID) | ORAL | Status: DC
  Administered 2024-04-28 – 2024-04-30 (×5): 5 mg via ORAL
  Filled 2024-04-28 (×5): qty 1

## 2024-04-28 MED ORDER — HYDROCERIN EX CREA
TOPICAL_CREAM | Freq: Two times a day (BID) | CUTANEOUS | Status: DC
  Administered 2024-05-10 – 2024-05-16 (×6): 1 via TOPICAL
  Filled 2024-04-28 (×2): qty 113

## 2024-04-28 MED ORDER — LIDOCAINE 5 % EX PTCH: 2.0000 | MEDICATED_PATCH | CUTANEOUS | Status: DC

## 2024-04-28 MED ORDER — LIDOCAINE 5 % EX PTCH
2.0000 | MEDICATED_PATCH | CUTANEOUS | Status: DC
  Administered 2024-04-28 – 2024-05-16 (×21): 2 via TRANSDERMAL
  Filled 2024-04-28 (×19): qty 2

## 2024-04-28 MED ORDER — METFORMIN HCL 500 MG PO TABS
1000.0000 mg | ORAL_TABLET | Freq: Two times a day (BID) | ORAL | Status: DC
  Administered 2024-04-28 – 2024-05-09 (×25): 1000 mg via ORAL
  Filled 2024-04-28 (×23): qty 2

## 2024-04-28 MED ORDER — CEPHALEXIN 250 MG PO CAPS
500.0000 mg | ORAL_CAPSULE | Freq: Three times a day (TID) | ORAL | Status: AC
  Administered 2024-04-28 – 2024-05-05 (×21): 500 mg via ORAL
  Filled 2024-04-28 (×21): qty 2

## 2024-04-28 NOTE — Evaluation (Signed)
 Recreational Therapy Assessment and Plan  Patient Details  Name: Jerome Vaughn MRN: [id number] Date of Birth: 14-Aug-1951 Today's Date: 04/28/2024  Rehab Potential:  Good ELOS:   d/c 8/14  Assessment  Hospital Problem: Principal Problem:   Acute incomplete quadriplegia [facility])     Past Medical History:      Past Medical History:  Diagnosis Date   Carpal tunnel syndrome      both hands   Colon polyps     Coronary artery disease      sees Dr. [name]   Diabetes mellitus      sees Dr. [name]   Diabetes mellitus [facility]) 2009   Ganglion cyst of wrist, right 1989   GERD (gastroesophageal reflux disease)     History of echocardiogram      a. Echo 4/17: Moderate LVH, EF 50-55%, mild LAE, no pericardial effusion   Hyperlipidemia     Hypertension     MGUS (monoclonal gammopathy of unknown significance)      sees Dr. [name]   Neuropathy      gets foot exams with Dr. [name] (podiatry)   Pneumonia     Rotator cuff injury          Past Surgical History:       Past Surgical History:  Procedure Laterality Date   ANTERIOR CERVICAL DECOMP/DISCECTOMY FUSION N/A 04/19/2024    Procedure: ANTERIOR CERVICAL DECOMPRESSION/DISCECTOMY FUSION CERVICAL SIX-SEVEN;  Surgeon: [name] MATSU, MD;  Location: [facility] OR;  Service: Neurosurgery;  Laterality: N/A;  ACDF C67   CARDIAC CATHETERIZATION N/A 11/21/2015    Procedure: Left Heart Cath and Coronary Angiography;  Surgeon: [name], MD;  Location: [facility] INVASIVE CV LAB;  Service: Cardiovascular;  Laterality: N/A;   COLONOSCOPY   03/18/2024    per Dr. [name], adenomatous polyp, repeat in 5 yrs   CORONARY ARTERY BYPASS GRAFT N/A 11/22/2015    Procedure: CORONARY ARTERY BYPASS GRAFTING (CABG) times five using the left internal mammary, right greater saphenous vein EVH, and left thigh greater saphenous vein EVH;  Surgeon: [name], MD;  Location: [facility] OR;  Service: Open Heart Surgery;  Laterality: N/A;   CORONARY ARTERY  BYPASS GRAFT   2017   CORONARY PRESSURE/FFR STUDY N/A 12/14/2018    Procedure: INTRAVASCULAR PRESSURE WIRE/FFR STUDY;  Surgeon: [name], MD;  Location: [facility] INVASIVE CV LAB;  Service: Cardiovascular;  Laterality: N/A;   ESOPHAGOGASTRODUODENOSCOPY   03/18/2024    per Dr. [name], normal   frozen shoulder release Right     GANGLION CYST EXCISION Right 1989   LEFT HEART CATH AND CORS/GRAFTS ANGIOGRAPHY N/A 10/27/2017    Procedure: LEFT HEART CATH AND CORS/GRAFTS ANGIOGRAPHY;  Surgeon: Sharps [name], MD;  Location: [name];  Service: Cardiovascular;  Laterality: N/A;   LEFT HEART CATH AND CORS/GRAFTS ANGIOGRAPHY N/A 12/14/2018    Procedure: LEFT HEART CATH AND CORS/GRAFTS ANGIOGRAPHY;  Surgeon: [name], MD;  Location: [facility] INVASIVE CV LAB;  Service: Cardiovascular;  Laterality: N/A;   TEE WITHOUT CARDIOVERSION N/A 11/22/2015    Procedure: TRANSESOPHAGEAL ECHOCARDIOGRAM (TEE);  Surgeon: [name], MD;  Location: [facility] OR;  Service: Open Heart Surgery;  Laterality: N/A;   WRIST GANGLION EXCISION Right            Assessment & Plan Clinical Impression: Patient is a 73 year old right-handed male with history of diabetes mellitus, CAD with CABG 2017, hypertension, hyperlipidemia. Presented 04/15/2024 after being on a roof cutting a tree  branch when the tree fell on him pinning him against the roof. No loss of conscious. He was found to be in shock and transfused 2+2, started on Levophed which was quickly weaned off. Cranial CT scan showed slight frontal scalp edema. No acute intracranial abnormality. No skull fracture. CT cervical spine widening of the left C3-4 facet, but no jumped or perched facets. No acute fracture of the cervical spine. MRI cervical spine showed focal hyperintense T2 weighted signal within the spinal cord at C3-4 levels and at C7 indicating possible myelomalacia or spinal cord edema. 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 Mild spinal canal stenosis and severe bilateral neural foraminal stenosis C3-4 CT of the chest abdomen pelvis unremarkable. CT maxillofacial showed soft tissue edema in the right cheek no acute facial bone fracture. Admission chemistries unremarkable aside glucose 177 creatinine 1.43 alkaline phosphatase 24, WBC 2.6, hemoglobin A1c 6.4, alcohol negative. Underwent arthrodesis C6-7 anterior interbody technique including discectomy for decompression of spinal cord and exiting nerve roots with foraminotomies. Placement of intervertebral biomechanical device C6-7 04/19/2024 per Dr. Dorn Ned. Placed in a soft cervical collar at all times. Tolerating a regular consistency diet.   Patient transferred to CIR on 04/21/2024 .    Pt presents with decreased activity tolerance, decreased functional mobility, decreased balance, decreased coordination Limiting pt's independence with leisure/community pursuits.  Met with pt today to discuss TR services including leisure education, activity analysis/modifications and stress management.  Also discussed the importance of social, emotional, spiritual health in addition to physical health and their effects on overall health and wellness.  Pt stated understanding.   Plan  Min 1 session >20 minutes during LOS  Recommendations for other services: None   Discharge Criteria: Patient will be discharged from TR if patient refuses treatment 3 consecutive times without medical reason.  If treatment goals not met, if there is a change in medical status, if patient makes no progress towards goals or if patient is discharged from hospital.  The above assessment, treatment plan, treatment alternatives and goals were discussed and mutually agreed upon: by patient  Jerome Vaughn 04/28/2024, 8:46 AM

## 2024-04-28 NOTE — Progress Notes (Signed)
 Nurse was in room introducing the coude cath to the wife when patient claims he was nauseated, sweating, claims not feeling well. BP taken 113/79 sitting.  Patient claims the abdominal binder was too tight. Loosened his abdominal binder  and belt  for now. Patient claims he feels much better. Patients Nurse notified of the incident. Dr Cornelio notified. Educated  patient and wife to call when an episode like this in the future.

## 2024-04-28 NOTE — Progress Notes (Signed)
 Physical Therapy Session Note  Patient Details  Name: Jerome Vaughn MRN: [id number] Date of Birth: February 27, 1951  Today's Date: 04/28/2024 PT Individual Time: 1000-1100 PT Individual Time Calculation (min): 60 min  and Today's Date: 04/28/2024 PT Co-Treatment Time: 1100-1115 PT Co-Treatment Time Calculation (min): 15 min  Short Term Goals: Week 1:  PT Short Term Goal 1 (Week 1): Pt will perform functional transfers with CGA PT Short Term Goal 2 (Week 1): Pt will be able to perform 4 steps with rails with min A PT Short Term Goal 3 (Week 1): Pt will be able to perform gait x 150' with min A  Skilled Therapeutic Interventions/Progress Updates:    Pt received in recliner and agreeable to therapy.  Pt reports increased unrated UE and shoulder pain, addressed with positioning. Session focused on addressing upright tolerance and managing BP with activity, vitals and activities listed below. Ted hose, ace wraps, and abd binder in place. Provided education on tools for managing OH, including LE exercise, not standing still if possible, slow elevation from supine>standing.   Vitals: 116/74(86)- reclined, legs elevated 86/63(71)-immediately after lowering legs 85/64(71)- 3-4 min after lowering legs 100/71(81)-after 3 min LE exercise 81/58(65)- immediately after standing ~1 min while marching in place 97/66(74)-after 3 min LE exercise 105/70(80)- seated without back support  At this time, transitioned to family meeting. Pt seen as co-treat with OT as part of family conference. Also present were MD, CSW, and nsg care coordinator. This therapist provided education on expected level of assist at d/c, anticipated equipment needs, and family training.  Pt remained in recliner at end of session and was left with all needs in reach and alarm active.   Therapy Documentation Precautions:  Precautions Precautions: Fall Recall of Precautions/Restrictions: Intact Precaution/Restrictions Comments: B rotator cuff  tears Required Braces or Orthoses: Cervical Brace Cervical Brace: Soft collar, Other (comment) Other Brace: Can remove when in bed and when showering. May apply/remove when sitting Restrictions Weight Bearing Restrictions Per Provider Order: No General:      Therapy/Group: Individual Therapy  Jerome Vaughn 04/28/2024, 10:55 AM

## 2024-04-28 NOTE — Progress Notes (Signed)
 Physical Therapy Session Note  Patient Details  Name: Jerome Vaughn MRN: [id number] Date of Birth: Jun 20, 1951  Today's Date: 04/28/2024 PT Individual Time: 1330-1415 PT Individual Time Calculation (min): 45 min   Short Term Goals: Week 1:  PT Short Term Goal 1 (Week 1): Pt will perform functional transfers with CGA PT Short Term Goal 2 (Week 1): Pt will be able to perform 4 steps with rails with min A PT Short Term Goal 3 (Week 1): Pt will be able to perform gait x 150' with min A  Skilled Therapeutic Interventions/Progress Updates:     Pt received supine in bed and was agreeable to therapy. Pt was orthostatic and required HOB to be laid flat and knees elevated. Vitals listed below. Pt denied pain. While supine, pt participated in the following strengthening and neuromuscular re-education exercises to improve motor control and coordination, sequencing and strength.   1x20 hip flexion B 1x10 supine bridges 1x10 resisted supine bridges w/ green TB 2x10 single-leg bridges (B concentric phase, SL eccentric lowering) 3x10 B hip flexion w/ adductor squeeze 1x10 dowel chest press 3# 1x10 dowel chest press 5# 1x10 sustained dowel chest press 5# w/ B hip flexion and adductor squeeze  Vitals:  78/57 at start of session, semi-fowlers in bed   116/98 supine w/ knees elevated  126/73 supine at end of session Pt returned to semi-fowlers and had call bell and all other needs in reach. Wife present throughout. Bed alarm on.   Therapy Documentation Precautions:  Precautions Precautions: Fall Recall of Precautions/Restrictions: Intact Precaution/Restrictions Comments: B rotator cuff tears Required Braces or Orthoses: Cervical Brace Cervical Brace: Soft collar, Other (comment) Other Brace: Can remove when in bed and when showering. May apply/remove when sitting Restrictions Weight Bearing Restrictions Per Provider Order: No General:    Therapy/Group: Individual Therapy  [name] 04/28/2024, 4:10 PM

## 2024-04-28 NOTE — Progress Notes (Signed)
 Occupational Therapy Session Note  Patient Details  Name: Jerome Vaughn MRN: [id number] Date of Birth: 01-Jan-1951  Today's Date: 04/28/2024 OT Individual Time: [phone] and 1115-1130 (1100-1130 total session time for cotx with PT) OT Individual Time Calculation (min): 45 min and 15 min    Short Term Goals: Week 1:  OT Short Term Goal 1 (Week 1): Pt will consistently direct care with family and staff regarding use/need of AE and orthotics for self feeding and/or daily tasks. OT Short Term Goal 2 (Week 1): Pt will completed UB dressing with Max A and use of compensatory techniques. OT Short Term Goal 3 (Week 1): Pt will completed simple grooming task such as brushing teeth with Max A and use of adaptive equipment   Skilled Therapeutic Interventions/Progress Updates:    Visit 1:  Pain: C/o pain in upper shoulders but was premedicated and able to tolerate therapy today  Pt received in bed and ready to participate in therapy.   PROM to B shoulders followed by a/arom with reaching toward ceiling. Pt able to tolerate ROM fairly well.  Improved finger flexion and extension of R hand.   Pt had on his TED hose and ACE wraps on. He said they were on all night. Left note for nursing to remove them tonight and wash his legs. From bed level, helped pt to don his sweat pants. Pt able to assist me by completing a bridge and partially pulling pants with L hand.  Blood pressure assessed in sitting once pt sat to EOB with CGA to min A.  80/66 Pt sat at EOB to engage in UB bathing and dressing.  Pt able to use L hand to partially wash chest and abdomen.  Max A with doffing and donning tshirt.    Then donned his abdominal binder and blood pressure improved to 102/81. Pt agreeable to sitting in recliner.  Sit to stand light CGA with CGA to stand pivot to recliner. Pt felt dizzy once sitting. BP 70/60.  Elevated pt's feet and reclined him slightly. Cues for relaxed breathing and arom of feet with ankle pumps.  BP  improved to 96/74.  Pt stated he felt stable.  Resting in recliner with belt alarm on and all needs met.   Visit 2: cotx with PT for family meeting with pt and his wife and other team members. Pt participated in education on his current status with arm function, balance, activity tolerance and his levels in self care.  Education on therapy approaches to improve functional level.   Pt in room with nursing and SW at end of session.    Therapy Documentation Precautions:  Precautions Precautions: Fall Recall of Precautions/Restrictions: Intact Precaution/Restrictions Comments: B rotator cuff tears Required Braces or Orthoses: Cervical Brace Cervical Brace: Soft collar, Other (comment) Other Brace: Can remove when in bed and when showering. May apply/remove when sitting Restrictions Weight Bearing Restrictions Per Provider Order: No    Vital Signs: Therapy Vitals Pulse Rate: 77 BP: 113/79 Patient Position (if appropriate): Sitting     Therapy/Group: Individual Therapy  Dayan Desa 04/28/2024, 12:20 PM
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
## 2024-04-28 NOTE — Progress Notes (Incomplete)
 Patient ID: Jerome Vaughn, male   DOB: 12-10-1950, 73 y.o.   MRN: [id number]  Patient/Family Conference  Patient/family in attendance:  Staff in attendance:  Main focus:  Synopsis of information shared:  Barriers/concerns expressed by patient and family:  Patient/family response:  Follow-up/action plans:

## 2024-04-29 ENCOUNTER — Inpatient Hospital Stay: Payer: Medicare HMO

## 2024-04-29 LAB — GLUCOSE, CAPILLARY
Glucose-Capillary: 167 mg/dL — ABNORMAL HIGH (ref 70–99)
Glucose-Capillary: 231 mg/dL — ABNORMAL HIGH (ref 70–99)
Glucose-Capillary: 113 mg/dL — ABNORMAL HIGH (ref 70–99)
Glucose-Capillary: 163 mg/dL — ABNORMAL HIGH (ref 70–99)

## 2024-04-29 LAB — CBC WITH DIFFERENTIAL/PLATELET
Abs Immature Granulocytes: 0.02 K/uL (ref 0.00–0.07)
Basophils Absolute: 0 K/uL (ref 0.0–0.1)
Basophils Relative: 0 %
Eosinophils Absolute: 0 K/uL (ref 0.0–0.5)
Eosinophils Relative: 0 %
HCT: 39 % (ref 39.0–52.0)
Hemoglobin: 13.2 g/dL (ref 13.0–17.0)
Immature Granulocytes: 0 %
Lymphocytes Relative: 9 %
Lymphs Abs: 0.6 K/uL — ABNORMAL LOW (ref 0.7–4.0)
MCH: 29.3 pg (ref 26.0–34.0)
MCHC: 33.8 g/dL (ref 30.0–36.0)
MCV: 86.7 fL (ref 80.0–100.0)
Monocytes Absolute: 0.8 K/uL (ref 0.1–1.0)
Monocytes Relative: 11 %
Neutro Abs: 5.7 K/uL (ref 1.7–7.7)
Neutrophils Relative %: 80 %
Platelets: 148 K/uL — ABNORMAL LOW (ref 150–400)
RBC: 4.5 MIL/uL (ref 4.22–5.81)
RDW: 13 % (ref 11.5–15.5)
WBC: 7.2 K/uL (ref 4.0–10.5)
nRBC: 0 % (ref 0.0–0.2)

## 2024-04-29 LAB — BASIC METABOLIC PANEL WITH GFR
Anion gap: 15 (ref 5–15)
BUN: 27 mg/dL — ABNORMAL HIGH (ref 8–23)
CO2: 18 mmol/L — ABNORMAL LOW (ref 22–32)
Calcium: 9.6 mg/dL (ref 8.9–10.3)
Chloride: 98 mmol/L (ref 98–111)
Creatinine, Ser: 1.55 mg/dL — ABNORMAL HIGH (ref 0.61–1.24)
GFR, Estimated: 48 mL/min — ABNORMAL LOW (ref >60)
Glucose, Bld: 174 mg/dL — ABNORMAL HIGH (ref 70–99)
Potassium: 4.3 mmol/L (ref 3.5–5.1)
Sodium: 131 mmol/L — ABNORMAL LOW (ref 135–145)

## 2024-04-29 MED ORDER — SODIUM CHLORIDE 0.9 % IV SOLN: INTRAVENOUS | Status: AC

## 2024-04-29 NOTE — Progress Notes (Signed)
 Physical Therapy Session Note  Patient Details  Name: Jerome Vaughn MRN: [id number] Date of Birth: 07-Feb-1951  Today's Date: 04/29/2024 PT Individual Time: 0805-0905 PT Individual Time Calculation (min): 60 min   Short Term Goals: Week 1:  PT Short Term Goal 1 (Week 1): Pt will perform functional transfers with CGA PT Short Term Goal 2 (Week 1): Pt will be able to perform 4 steps with rails with min A PT Short Term Goal 3 (Week 1): Pt will be able to perform gait x 150' with min A  Skilled Therapeutic Interventions/Progress Updates: Pt presented in bed agreeable to therapy. Pt states unrated pain which is more controlled this morning. PTA donned TED hose and ace bandages dependently. Participated in supine therex with vitals monitored as noted below. Pt indicated need for BM. Placed bedpan with pt able to bridge for BM. Performed bridge to remove bedpan and pt able to roll L/R for total A peri-care and donning new brief. Pt completed additional therex with BP maintaining stable. Pt agreeable to attempt to sit EOB with pt mildly symptomatic but improved after ankle pumps and LAQ. Upon nsg entering room pt indicating need for another BM. Pt agreeable to try pivot to [facility]. Abdominal binder not placed due to at the moment stable BP. Completed stand step transfer to [facility] with RW and CGA. Pt required assist for clothing management. While on [facility] pt with near syncopal episode with eyes glazed over and becoming minimally responsive. PTA then performed dependent transfer to bed with PTA elevating BLE. Pt becoming more responsive. With nsg present pt repositioned to comfort and ultimately bed pan placed for additional continent BM. Pt left resting in bed with nsg present and current needs met.   Supine Ankle pumps x20 Heel slides x20 Hip abd/add x20 Hip ER against green theraband x20 SLR x 10 bilaterally [place] flexion/extension x 10 Seated EOB Ankle pumps LAQ Seated marches   Supine no prophylactics  132/78 (94) HR 76  45 degrees after BM 114/73 (85) HR  87 45 degrees after therex 109/66 (80) HR91 Sitting EOB without binder 0 min 91/63 (73) HR 97 Sitting EOB >3 min without binder 99/63 (71) HR 103 BP after return to bed with legs elevated 167/94 (116) HR 69 BP at end of session with HOB elevated 32 degrees 130/79 (94) HR 87       Therapy Documentation Precautions:  Precautions Precautions: Fall Recall of Precautions/Restrictions: Intact Precaution/Restrictions Comments: B rotator cuff tears Required Braces or Orthoses: Cervical Brace Cervical Brace: Soft collar, Other (comment) Other Brace: Can remove when in bed and when showering. May apply/remove when sitting Restrictions Weight Bearing Restrictions Per Provider Order: No   Therapy/Group: Individual Therapy  Shannell Mikkelsen 04/29/2024, 3:46 PM

## 2024-04-29 NOTE — Progress Notes (Signed)
 Occupational Therapy Session Note  Patient Details  Name: Jerome Vaughn MRN: [id number] Date of Birth: [birth year]  Today's Date: 04/29/2024 OT Individual Time: 9069-9042 OT Individual Time Calculation (min): 27 min    Short Term Goals: Week 1:  OT Short Term Goal 1 (Week 1): Pt will consistently direct care with family and staff regarding use/need of AE and orthotics for self feeding and/or daily tasks. OT Short Term Goal 2 (Week 1): Pt will completed UB dressing with Max A and use of compensatory techniques. OT Short Term Goal 3 (Week 1): Pt will completed simple grooming task such as brushing teeth with Max A and use of adaptive equipment  Skilled Therapeutic Interventions/Progress Updates:    Pt bed level at time of session, per pt and staff pt had almost syncopal/OH episode this AM on [facility] and pt reports he was wiped out. Focus of session on bed level activity with initially using adaptive cup for drinking, using both hands and handle for support, MOD A overall. Multiple grip strengthening activities for wash cloth ring outs, squeezes/grips, and pulls. Desensitization as well along B forearms and hands with rough wash cloth and educated on sensory techniques. Brief soft tissue massage as well along upper back musculature per pt reports of discomfort. Pt reports that injections yesterday really helped. Bed level alarm on call bell in reach.  Therapy Documentation Precautions:  Precautions Precautions: Fall Recall of Precautions/Restrictions: Intact Precaution/Restrictions Comments: B rotator cuff tears Required Braces or Orthoses: Cervical Brace Cervical Brace: Soft collar, Other (comment) Other Brace: Can remove when in bed and when showering. May apply/remove when sitting Restrictions Weight Bearing Restrictions Per Provider Order: No    Therapy/Group: Individual Therapy  Chiquita JAYSON Hopping 04/29/2024, 7:21 AM

## 2024-04-29 NOTE — Progress Notes (Signed)
 Physical Therapy Session Note  Patient Details  Name: Jerome Vaughn MRN: [id number] Date of Birth: December 28, 1950  Today's Date: 04/29/2024 PT Individual Time: 1000-1115, 1400-1445 PT Individual Time Calculation (min): 75 min, 45 min  Short Term Goals: Week 1:  PT Short Term Goal 1 (Week 1): Pt will perform functional transfers with CGA PT Short Term Goal 2 (Week 1): Pt will be able to perform 4 steps with rails with min A PT Short Term Goal 3 (Week 1): Pt will be able to perform gait x 150' with min A  Skilled Therapeutic Interventions/Progress Updates:     AM session:  Pt was received supine in bed with ted hose and ace wraps donned. Pt w/ c/o being light-headed. Pt stated he had pain in B shoulders, per wong-baker faces 8/10 as they were tender to palpate. Pain addressed through physical movement. BP taken in supine before attempting to sit EOB, 98/58. Abdominal binder donned with total assist. Pt performed supine to sit transfer w/ minA for trunk management. Pt was symptomatic and visibly flush; BP was taken seated EOB 82/60. Pt returned to supine w/ modA managing BLE. Bed was laid flat and legs elevated. BP 116/72 w/ decreased symptoms. Gradual approach to acclimating pt to upright sitting, HOB elevated from 0 to 30 degrees, then up to 50 degrees w/o symptoms. Legs dropped down to 0 degrees, pt remained without symptoms. HOB to 60 degrees and pt remained without symptoms. Pt moved into sitting EOB but did not tolerate it, requested to lie back down quickly. BP taken in semi-fowlers position w/ abd binder donned, 97/72. Abd binder removed per patient request, BP 103/66. Abd binder left doffed.  Pt performed 3x10 reverse crunches in supine w/ adductor squeeze. Pt remained supine in bed at end of session with call bell and all needs within reach.   Vitals: 98/58 supine before session 82/60 seated w/ abd binder (symptomatic) 116/72 supine w/ legs elevated (decreased symptoms) HOB elevated from 0 to  ~30 to ~50 degrees (knees remain elevated) Knees dropped/long sitting, no symptoms HOB to 60 degrees in long sitting, no symptoms 97/72 semi fowlers w/ abd binder 103/66 semi fowlers w/o abd binder  PM session:  Pt received supine in bed and was agreeable to therapy. Pt reported some discomfort in B shoulders, per wong baker faces 6/10. Pain addressed through physical movement. Pt performed the following exercises to provide neuromuscular education and improve strength and coordination of BLE and core musculature.   - Supine SAQ over bolster x20 5# ankle weight each side  -2x20 7.5# ankle weight each side - Yoga ball squeezes 2x10 w/ 1sec hold. Performed to target scapular muscle stabilizers including rhomboids. Pt given verbal and tactile cues with good carryover.  - SLR 2x10 B 5# ankle weights   Pt reported some B shoulder pain relief after performing the yoga ball squeezes/barrel squeezes. At end of session, patient remained supine in bed with call bell and all other needs in reach at this time.    Therapy Documentation Precautions:  Precautions Precautions: Fall Recall of Precautions/Restrictions: Intact Precaution/Restrictions Comments: B rotator cuff tears Required Braces or Orthoses: Cervical Brace Cervical Brace: Soft collar, Other (comment) Other Brace: Can remove when in bed and when showering. May apply/remove when sitting Restrictions Weight Bearing Restrictions Per Provider Order: No General:     Therapy/Group: Individual Therapy  Jerome Vaughn 04/29/2024, 10:09 AM

## 2024-04-29 NOTE — Progress Notes (Signed)
   04/29/24 1200  Spiritual Encounters  Type of Visit Initial  Care provided to: Pt and family  Referral source Family  Reason for visit Advance directives  OnCall Visit No  Interventions  Spiritual Care Interventions Made Established relationship of care and support;Compassionate presence;Reflective listening;Encouragement  Intervention Outcomes  Outcomes Awareness of support  Spiritual Care Plan  Spiritual Care Issues Still Outstanding No further spiritual care needs at this time (see row info)   Family requested another overview of education on Advanced Directive documentation.  Chaplain explained the documentation and offered space for any additional questions or concerns.   Instructed Pt and family how to reach out to Spiritual Care if they have any questions and to contact us  when they are ready to move forward.  Chaplain services remain available as the need arises.

## 2024-04-29 NOTE — Progress Notes (Addendum)
   04/29/24 1800  Urine Measurement/Characteristics  Urinary Interventions Bladder scan  Bladder Scan Volume (mL) 120 mL  Hygiene Peri care (patient refused to have in out cath atm wanted to wait till about nine pm)   Pt refused in/out cath due at 6pm, wanted to wait till nine pm, bladder scan 120 at 6pm As well PT does not want Kude Caths anymore, just regular caths from this point on

## 2024-04-29 NOTE — Progress Notes (Signed)
 Physical Therapy Weekly Progress Note  Patient Details  Name: Jerome Vaughn MRN: [id number] Date of Birth: May 29, 1951  Beginning of progress report period: April 22, 2024 End of progress report period: April 30, 2007    Patient has met 2 of 3 short term goals.  Pt was progressing well toward goals and continues to demo mobility at CGA-min a level, however, he has suffered slow progress d/t consistent orthostatic hypotension with near syncopal episodes. Expect mobility to continue improving once pt has stabilized to allow better participation in upright therapy.   Patient continues to demonstrate the following deficits muscle weakness, muscle joint tightness, and muscle paralysis, decreased cardiorespiratoy endurance, impaired timing and sequencing, abnormal tone, and decreased coordination, and decreased sitting balance, decreased standing balance, decreased postural control, and decreased balance strategies and therefore will continue to benefit from skilled PT intervention to increase functional independence with mobility.  Patient progressing toward long term goals..  Continue plan of care.  PT Short Term Goals Week 1:  PT Short Term Goal 1 (Week 1): Pt will perform functional transfers with CGA PT Short Term Goal 1 - Progress (Week 1): Not met PT Short Term Goal 2 (Week 1): Pt will be able to perform 4 steps with rails with min A PT Short Term Goal 2 - Progress (Week 1): Other (comment) PT Short Term Goal 3 (Week 1): Pt will be able to perform gait x 150' with min A PT Short Term Goal 3 - Progress (Week 1): Met Week 2:  PT Short Term Goal 1 (Week 2): Pt will be able to maintain seated upright in w/c >2 hours PT Short Term Goal 2 (Week 2): Pt will tolerate standing activity > 2 min PT Short Term Goal 3 (Week 2): Pt will initiate stair training  Skilled Therapeutic Interventions/Progress Updates:  Ambulation/gait training;Balance/vestibular training;Community reintegration;Discharge  planning;Disease management/prevention;DME/adaptive equipment instruction;Functional mobility training;Neuromuscular re-education;Pain management;Patient/family education;Psychosocial support;Skin care/wound management;Splinting/orthotics;Stair training;Therapeutic Activities;Therapeutic Exercise;UE/LE Strength taining/ROM;UE/LE Coordination activities;Wheelchair propulsion/positioning   Therapy Documentation Precautions:  Precautions Precautions: Fall Recall of Precautions/Restrictions: Intact Precaution/Restrictions Comments: B rotator cuff tears Required Braces or Orthoses: Cervical Brace Cervical Brace: Soft collar, Other (comment) Other Brace: Can remove when in bed and when showering. May apply/remove when sitting Restrictions Weight Bearing Restrictions Per Provider Order: No General:      Therapy/Group: Individual Therapy  [name] 04/29/2024, 4:57 PM
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
## 2024-04-30 DIAGNOSIS — R339 Retention of urine, unspecified: Secondary | ICD-10-CM

## 2024-04-30 DIAGNOSIS — I959 Hypotension, unspecified: Secondary | ICD-10-CM

## 2024-04-30 DIAGNOSIS — K5901 Slow transit constipation: Secondary | ICD-10-CM

## 2024-04-30 LAB — BASIC METABOLIC PANEL WITH GFR
Anion gap: 11 (ref 5–15)
BUN: 30 mg/dL — ABNORMAL HIGH (ref 8–23)
CO2: 21 mmol/L — ABNORMAL LOW (ref 22–32)
Calcium: 9.3 mg/dL (ref 8.9–10.3)
Chloride: 101 mmol/L (ref 98–111)
Creatinine, Ser: 1.26 mg/dL — ABNORMAL HIGH (ref 0.61–1.24)
GFR, Estimated: >60 mL/min (ref >60)
Glucose, Bld: 132 mg/dL — ABNORMAL HIGH (ref 70–99)
Potassium: 4.2 mmol/L (ref 3.5–5.1)
Sodium: 133 mmol/L — ABNORMAL LOW (ref 135–145)

## 2024-04-30 LAB — GLUCOSE, CAPILLARY
Glucose-Capillary: 144 mg/dL — ABNORMAL HIGH (ref 70–99)
Glucose-Capillary: 224 mg/dL — ABNORMAL HIGH (ref 70–99)
Glucose-Capillary: 123 mg/dL — ABNORMAL HIGH (ref 70–99)
Glucose-Capillary: 137 mg/dL — ABNORMAL HIGH (ref 70–99)

## 2024-04-30 LAB — URINE CULTURE: Culture: >=100000 — AB

## 2024-04-30 MED ORDER — MIDODRINE HCL 5 MG PO TABS
10.0000 mg | ORAL_TABLET | Freq: Three times a day (TID) | ORAL | Status: DC
  Administered 2024-04-30 – 2024-05-01 (×3): 10 mg via ORAL
  Filled 2024-04-30 (×3): qty 2
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
## 2024-04-30 NOTE — Progress Notes (Signed)
 Physical Therapy Session Note  Patient Details  Name: Jerome Vaughn MRN: [id number] Date of Birth: [birth year]  Today's Date: 04/30/2024 PT Individual Time: 0900-1000 PT Individual Time Calculation (min): 60 min   Short Term Goals: Week 1:  PT Short Term Goal 1 (Week 1): Pt will perform functional transfers with CGA PT Short Term Goal 1 - Progress (Week 1): Not met PT Short Term Goal 2 (Week 1): Pt will be able to perform 4 steps with rails with min A PT Short Term Goal 2 - Progress (Week 1): Other (comment) PT Short Term Goal 3 (Week 1): Pt will be able to perform gait x 150' with min A PT Short Term Goal 3 - Progress (Week 1): Met  Skilled Therapeutic Interventions/Progress Updates:  Pt was bedside in the am. Donned TED hose and ace wraps. Donned abd binder. Pt rolled R/L to assist with donning pants. Pt transferred supine to edge of bed with contact guard. Pt's BP 112/72 with HR 69. Pt transferred sit to stand with rolling walker and min A. Pt's BP in standing 70/49 with HR 76 with c/o dizziness. Pt returned to edge of bed with BP 91/71 with HR 71. Pt's BP after 5 minutes at edge of bed 85/67 with HR 70 with mild c/o dizziness. Pt performed marching on edge of bed. Re-attempted to standing, pt c/o dizziness, unable to obtain BP in standing, returned to supine. Pt's BP on edge of bed 86/61 with HR 70. Pt transferred edge of bed to supine with contact guard. Removed ace wraps. Pt left sitting up in bed with bed alarm and all needs within reach. Notified MD in regards to orthostasis in standing.   Therapy Documentation Precautions:  Precautions Precautions: Fall Recall of Precautions/Restrictions: Intact Precaution/Restrictions Comments: B rotator cuff tears Required Braces or Orthoses: Cervical Brace Cervical Brace: Soft collar, Other (comment) Other Brace: Can remove when in bed and when showering. May apply/remove when sitting Restrictions Weight Bearing Restrictions Per Provider Order:  No General:   Pain: Pt c/o 7/10 B shoulders.    Therapy/Group: Individual Therapy  Merilee Lynwood MATSU 04/30/2024, 12:32 PM

## 2024-05-01 LAB — GLUCOSE, CAPILLARY
Glucose-Capillary: 137 mg/dL — ABNORMAL HIGH (ref 70–99)
Glucose-Capillary: 151 mg/dL — ABNORMAL HIGH (ref 70–99)
Glucose-Capillary: 143 mg/dL — ABNORMAL HIGH (ref 70–99)
Glucose-Capillary: 146 mg/dL — ABNORMAL HIGH (ref 70–99)

## 2024-05-01 MED ORDER — TAMSULOSIN HCL 0.4 MG PO CAPS
0.4000 mg | ORAL_CAPSULE | Freq: Every day | ORAL | Status: DC
  Administered 2024-05-01 – 2024-05-16 (×19): 0.4 mg via ORAL
  Filled 2024-05-01 (×16): qty 1

## 2024-05-01 MED ORDER — ATOMOXETINE HCL 10 MG PO CAPS
20.0000 mg | ORAL_CAPSULE | Freq: Every day | ORAL | Status: DC
  Administered 2024-05-02 – 2024-05-09 (×9): 20 mg via ORAL
  Filled 2024-05-01 (×8): qty 2

## 2024-05-01 MED ORDER — BISACODYL 10 MG RE SUPP: 10.0000 mg | Freq: Every day | RECTAL | Status: DC | PRN

## 2024-05-01 MED ORDER — MIDODRINE HCL 5 MG PO TABS
10.0000 mg | ORAL_TABLET | Freq: Three times a day (TID) | ORAL | Status: DC
  Administered 2024-05-01 – 2024-05-06 (×14): 10 mg via ORAL
  Administered 2024-05-06: 5 mg via ORAL
  Administered 2024-05-06 – 2024-05-17 (×42): 10 mg via ORAL
  Filled 2024-05-01 (×49): qty 2

## 2024-05-01 MED ORDER — ATOMOXETINE HCL 10 MG PO CAPS
18.0000 mg | ORAL_CAPSULE | Freq: Every day | ORAL | Status: DC
  Filled 2024-05-01: qty 2

## 2024-05-01 MED ORDER — ATOMOXETINE HCL 18 MG PO CAPS
18.0000 mg | ORAL_CAPSULE | Freq: Every day | ORAL | Status: DC
  Filled 2024-05-01: qty 1

## 2024-05-01 MED ORDER — FLUDROCORTISONE ACETATE 0.1 MG PO TABS
0.1000 mg | ORAL_TABLET | Freq: Every day | ORAL | Status: DC
  Administered 2024-05-01 – 2024-05-17 (×20): 0.1 mg via ORAL
  Filled 2024-05-01 (×17): qty 1

## 2024-05-01 NOTE — Progress Notes (Signed)
 Physical Therapy Session Note  Patient Details  Name: Jerome Vaughn MRN: [id number] Date of Birth: [birth year]  Today's Date: 05/01/2024 PT Individual Time: 9154-9066 PT Individual Time Calculation (min): 48 min   Short Term Goals: Week 1:  PT Short Term Goal 1 (Week 1): Pt will perform functional transfers with CGA PT Short Term Goal 1 - Progress (Week 1): Not met PT Short Term Goal 2 (Week 1): Pt will be able to perform 4 steps with rails with min A PT Short Term Goal 2 - Progress (Week 1): Other (comment) PT Short Term Goal 3 (Week 1): Pt will be able to perform gait x 150' with min A PT Short Term Goal 3 - Progress (Week 1): Met  Skilled Therapeutic Interventions/Progress Updates:  Pt was seen bedside in the am. Pt donned TED hoses and ace wraps as well as abdominal binder. BP in supine 11/77 with HR 60. Pt transferred supine to edge of bed with S. Pt's BP on edge of bed 105/72 with HR 67. Pt transferred to standing with rolling walker and contact guard. Pt's BP in standing 80/49 with HR 76 and c/o dizziness. Pt returned to edge of bed, BP was 88/61 with HR 69. Pt's sitting balance on edge of bed with S. After 5 minutes re-attempted standing with rolling walker and contact guard. Pt's BP 78/56 with HR 73 and c/o dizziness. Pt returned to edge of bed, BP 86/63 with HR 68. Pt tolerated edge of bed for 20+ minutes with S. Pt began to c/o dizziness. Pt returned to supine with S. Pt's BP supine in bed at end of treatment 139/76 with HR 50. Pt left sitting up in bed with bed alarm and all needs within reach.   Therapy Documentation Precautions:  Precautions Precautions: Fall Recall of Precautions/Restrictions: Intact Precaution/Restrictions Comments: B rotator cuff tears Required Braces or Orthoses: Cervical Brace Cervical Brace: Soft collar, Other (comment) Other Brace: Can remove when in bed and when showering. May apply/remove when sitting Restrictions Weight Bearing Restrictions Per  Provider Order: No General:   Vital Signs:   Pain: No c/o pain.     Therapy/Group: Individual Therapy  Merilee Lynwood MATSU 05/01/2024, 12:13 PM
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
## 2024-05-02 DIAGNOSIS — E119 Type 2 diabetes mellitus without complications: Secondary | ICD-10-CM

## 2024-05-02 DIAGNOSIS — R7989 Other specified abnormal findings of blood chemistry: Secondary | ICD-10-CM

## 2024-05-02 DIAGNOSIS — Z794 Long term (current) use of insulin: Secondary | ICD-10-CM

## 2024-05-02 DIAGNOSIS — I951 Orthostatic hypotension: Secondary | ICD-10-CM

## 2024-05-02 LAB — CBC WITH DIFFERENTIAL/PLATELET
Abs Granulocyte: 2 K/uL (ref 1.5–6.5)
Abs Immature Granulocytes: 0.02 K/uL (ref 0.00–0.07)
Basophils Absolute: 0 K/uL (ref 0.0–0.1)
Basophils Relative: 1 %
Eosinophils Absolute: 0.1 K/uL (ref 0.0–0.5)
Eosinophils Relative: 2 %
HCT: 38.6 % — ABNORMAL LOW (ref 39.0–52.0)
Hemoglobin: 12.9 g/dL — ABNORMAL LOW (ref 13.0–17.0)
Immature Granulocytes: 1 %
Lymphocytes Relative: 37 %
Lymphs Abs: 1.6 K/uL (ref 0.7–4.0)
MCH: 28.9 pg (ref 26.0–34.0)
MCHC: 33.4 g/dL (ref 30.0–36.0)
MCV: 86.5 fL (ref 80.0–100.0)
Monocytes Absolute: 0.5 K/uL (ref 0.1–1.0)
Monocytes Relative: 12 %
Neutro Abs: 2 K/uL (ref 1.7–7.7)
Neutrophils Relative %: 47 %
Platelets: 185 K/uL (ref 150–400)
RBC: 4.46 MIL/uL (ref 4.22–5.81)
RDW: 12.4 % (ref 11.5–15.5)
Smear Review: NORMAL
WBC: 4.2 K/uL (ref 4.0–10.5)
nRBC: 0 % (ref 0.0–0.2)

## 2024-05-02 LAB — COMPREHENSIVE METABOLIC PANEL WITH GFR
ALT: 62 U/L — ABNORMAL HIGH (ref 0–44)
AST: 43 U/L — ABNORMAL HIGH (ref 15–41)
Albumin: 2.9 g/dL — ABNORMAL LOW (ref 3.5–5.0)
Alkaline Phosphatase: 46 U/L (ref 38–126)
Anion gap: 11 (ref 5–15)
BUN: 20 mg/dL (ref 8–23)
CO2: 22 mmol/L (ref 22–32)
Calcium: 9.4 mg/dL (ref 8.9–10.3)
Chloride: 100 mmol/L (ref 98–111)
Creatinine, Ser: 1.21 mg/dL (ref 0.61–1.24)
GFR, Estimated: >60 mL/min (ref >60)
Glucose, Bld: 145 mg/dL — ABNORMAL HIGH (ref 70–99)
Potassium: 4.1 mmol/L (ref 3.5–5.1)
Sodium: 133 mmol/L — ABNORMAL LOW (ref 135–145)
Total Bilirubin: 0.5 mg/dL (ref 0.0–1.2)
Total Protein: 7.7 g/dL (ref 6.5–8.1)

## 2024-05-02 LAB — GLUCOSE, CAPILLARY
Glucose-Capillary: 128 mg/dL — ABNORMAL HIGH (ref 70–99)
Glucose-Capillary: 161 mg/dL — ABNORMAL HIGH (ref 70–99)
Glucose-Capillary: 124 mg/dL — ABNORMAL HIGH (ref 70–99)
Glucose-Capillary: 122 mg/dL — ABNORMAL HIGH (ref 70–99)

## 2024-05-02 MED ORDER — ACETAMINOPHEN 325 MG PO TABS
650.0000 mg | ORAL_TABLET | Freq: Three times a day (TID) | ORAL | Status: DC
  Administered 2024-05-02 – 2024-05-09 (×32): 650 mg via ORAL
  Filled 2024-05-02 (×29): qty 2
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
## 2024-05-02 NOTE — Plan of Care (Signed)
  Problem: Consults Goal: RH SPINAL CORD INJURY PATIENT EDUCATION Description:  See Patient Education module for education specifics.  Outcome: Progressing   Problem: SCI BOWEL ELIMINATION Goal: RH STG MANAGE BOWEL WITH ASSISTANCE Description: STG Manage Bowel with supervision-min Assistance. Outcome: Progressing   Problem: SCI BLADDER ELIMINATION Goal: RH STG MANAGE BLADDER WITH ASSISTANCE Description: STG Manage Bladder With supervision-min Assistance Outcome: Progressing   Problem: RH SKIN INTEGRITY Goal: RH STG SKIN FREE OF INFECTION/BREAKDOWN Description: Manage skin free of infection with supervision - min assistance Outcome: Progressing   Problem: RH SAFETY Goal: RH STG ADHERE TO SAFETY PRECAUTIONS W/ASSISTANCE/DEVICE Description: STG Adhere to Safety Precautions With supervision- min Assistance/Device. Outcome: Progressing   Problem: RH PAIN MANAGEMENT Goal: RH STG PAIN MANAGED AT OR BELOW PT'S PAIN GOAL Description: <4 w/ prns Outcome: Progressing   Problem: RH KNOWLEDGE DEFICIT SCI Goal: RH STG INCREASE KNOWLEDGE OF SELF CARE AFTER SCI Description: Manage increase knowledge of self care after SCI with supervision- min assistance from wife using educational materials provided Outcome: Progressing   Problem: Education: Goal: Ability to describe self-care measures that may prevent or decrease complications (Diabetes Financial trader) will improve Outcome: Progressing Goal: Individualized Educational Video(s) Outcome: Progressing   Problem: Coping: Goal: Ability to adjust to condition or change in health will improve Outcome: Progressing   Problem: Fluid Volume: Goal: Ability to maintain a balanced intake and output will improve Outcome: Progressing   Problem: Health Behavior/Discharge Planning: Goal: Ability to identify and utilize available resources and services will improve Outcome: Progressing Goal: Ability to manage health-related needs will  improve Outcome: Progressing   Problem: Metabolic: Goal: Ability to maintain appropriate glucose levels will improve Outcome: Progressing   Problem: Nutritional: Goal: Maintenance of adequate nutrition will improve Outcome: Progressing Goal: Progress toward achieving an optimal weight will improve Outcome: Progressing   Problem: Skin Integrity: Goal: Risk for impaired skin integrity will decrease Outcome: Progressing   Problem: Tissue Perfusion: Goal: Adequacy of tissue perfusion will improve Outcome: Progressing

## 2024-05-02 NOTE — Progress Notes (Signed)
 Physical Therapy Session Note  Patient Details  Name: Jerome Vaughn MRN: [id number] Date of Birth: 23-Feb-1951  Today's Date: 05/02/2024 PT Individual Time: 0924-1018 PT Individual Time Calculation (min): 54 min   Short Term Goals: Week 2:  PT Short Term Goal 1 (Week 2): Pt will be able to maintain seated upright in w/c >2 hours PT Short Term Goal 2 (Week 2): Pt will tolerate standing activity > 2 min PT Short Term Goal 3 (Week 2): Pt will initiate stair training  Skilled Therapeutic Interventions/Progress Updates:     Patient supine and agreeable to therapy. HOB elevated gradually to 60 degrees, pausing to take BP at regular intervals. Pt sat EOB w/ supervision for safety, feet flat on floor. Bed elevated gradually with pt scooting to edge to ensure feet flat on floor, until pt in perched position. BP taken throughout. Pt stood w/ CGA for safety and balance w/ RW. Pt became symptomatic shortly after standing, having near syncopal episode and was quickly returned to sitting. Vitals reported below.   Supine BP: 132/86 (100) 59bpm HOB 30 degrees: 136/81 (98) 61bpm  HOB 60 degrees: 128/83 (96) 59bpm Seated EOB, added abd binder: 100/76(84) 73 bpm Perch sitting: 89/64 (73) 78bpm  Perch sitting position 2: 88/68(75) 75 bpm Perch sitting to standing: 92/72(80) 73 bpm (near syncopal episode)  Seated EOB: 73/51 (58) 76bpm  Pt reports some relief of symptoms after sitting. 90* 90* w/ feet flat on floor: 87/67 (76) 74bpm Pt then performed stand step transfer w/ modA via HHA to recliner to improve upright tolerance. Pt tolerated sitting in recliner well, manual therapy performed on B shoulders to decrease pain including cross-friction massage. Pt reported 7/10 pain decreased to 6/10 w/ manual therapy. Pt TTP B short head biceps tendon/proximal portion of biceps mm. Reclined w/ legs elevated: 99/76 (83) 70bpm. After several minutes of manual therapy, BP taken again and was at 116/83 (95) 61bpm. Pt left  with all needs in reach including call bell, chair alarm activated.   Therapy Documentation Precautions:  Precautions Precautions: Fall Recall of Precautions/Restrictions: Intact Precaution/Restrictions Comments: B rotator cuff tears Required Braces or Orthoses: Cervical Brace Cervical Brace: Soft collar, Other (comment) Other Brace: Can remove when in bed and when showering. May apply/remove when sitting Restrictions Weight Bearing Restrictions Per Provider Order: No  Therapy/Group: Individual Therapy  [name] 05/02/2024, 9:40 AM

## 2024-05-02 NOTE — Progress Notes (Signed)
 Occupational Therapy Session Note  Patient Details  Name: Jerome Vaughn MRN: [id number] Date of Birth: Jul 01, 1951  Today's Date: 05/02/2024 OT Individual Time: [id number] OT Individual Time Calculation (min): 75 min    Short Term Goals: Week 1:  OT Short Term Goal 1 (Week 1): Pt will consistently direct care with family and staff regarding use/need of AE and orthotics for self feeding and/or daily tasks. OT Short Term Goal 2 (Week 1): Pt will completed UB dressing with Max A and use of compensatory techniques. OT Short Term Goal 3 (Week 1): Pt will completed simple grooming task such as brushing teeth with Max A and use of adaptive equipment   Skilled Therapeutic Interventions/Progress Updates:    Pt bed level at time of session finishing up breakfast with NT, OT resuming care. Pt with 5/10 pain in R shoulder which is mostly constant. Pt received pain medication during session as well this date. OT providing swivel fork that wife bought and used for self feeding session - assist with stabbing food/fruit but pt able to grasp large handles and bring to mouth with fork angled. 2 hands for grasp and supporting coffee cup and water this date but pt able to perform with MIN A and no spillage. OT donning TEDS, ACE and binder this date for BP management. Bed mobility supine <> sit with Supervision throughout, assist with TEDS, donning shoes with MAX A, pt able to doff by using opposite foot to slide off. Focus of session on mobility with closely monitoring BP with the following readings:  Supine semireclined 108/73  HR 63 Sitting EOB with TEDS and ACE 106/70 Sitting EOB with TEDS/ACE/binder 120/79 Sitting after 5 mins of activity 114/82   S/p standing, unable to stand long enough for reading: 76/42  Pt able to verbalize when feeling dizzy and lightheaded, needing to sit back down on bed when BP dropped. Oral hygiene with built up handles and having pt open small items including toothpaste with MIN  overall. Pt with incontinent episode of bM/smear in brief. Bed level able to roll L/R for clean up with CGA and pt wanting time on bed pan. Pt left on bed pan with call bell in reach all needs met.   Therapy Documentation Precautions:  Precautions Precautions: Fall Recall of Precautions/Restrictions: Intact Precaution/Restrictions Comments: B rotator cuff tears Required Braces or Orthoses: Cervical Brace Cervical Brace: Soft collar, Other (comment) Other Brace: Can remove when in bed and when showering. May apply/remove when sitting Restrictions Weight Bearing Restrictions Per Provider Order: No    Therapy/Group: Individual Therapy  Chiquita JAYSON Hopping 05/02/2024, 7:18 AM

## 2024-05-02 NOTE — Progress Notes (Signed)
 Soft cervical collar in place. Patient refused left hand splint tonight.

## 2024-05-02 NOTE — Progress Notes (Signed)
 Occupational Therapy Weekly Progress Note  Patient Details  Name: Jerome Vaughn MRN: [id number] Date of Birth: November 20, 1950  Beginning of progress report period: April 22, 2024 End of progress report period: May 02, 2024  Today's Date: 05/02/2024 OT Individual Time: 1305-1420 OT Individual Time Calculation (min): 75 min    Patient has met 3 of 3 short term goals.  Pt has improved movement of R arm as he can now reach it up to touch the top of his head and lift it to use L hand to wash under R arm.  He now has active wrist extension and can grasp and pull using arm with fair strength. Continues to have limited mobility of L shoulder. Pt has made strong progress with use of R arm this last week. He continues to have challenges with low blood pressure that limits his activity tolerance.   Patient continues to demonstrate the following deficits: muscle weakness and muscle joint tightness, decreased cardiorespiratoy endurance, unbalanced muscle activation and decreased coordination, and decreased sitting balance, decreased standing balance, decreased postural control, and decreased balance strategies and therefore will continue to benefit from skilled OT intervention to enhance overall performance with BADL.  Patient progressing toward long term goals..  Continue plan of care.  OT Short Term Goals Week 1:  OT Short Term Goal 1 (Week 1): Pt will consistently direct care with family and staff regarding use/need of AE and orthotics for self feeding and/or daily tasks. OT Short Term Goal 1 - Progress (Week 1): Met OT Short Term Goal 2 (Week 1): Pt will completed UB dressing with Max A and use of compensatory techniques. OT Short Term Goal 2 - Progress (Week 1): Met OT Short Term Goal 3 (Week 1): Pt will completed simple grooming task such as brushing teeth with Max A and use of adaptive equipment OT Short Term Goal 3 - Progress (Week 1): Met Week 2:  OT Short Term Goal 1 (Week 2): Pt will have improved  standing tolerance to be able to manage his clothing over his hips to prepare for toileting tasks. OT Short Term Goal 2 (Week 2): Pt will have improved standing tolerance to be able to tolerate standing long enough to pivot to a BSC with CGA or less. OT Short Term Goal 3 (Week 2): Pt will demonstrate improved B hand strength to use hands to pull pants over hips with mod A or less. OT Short Term Goal 4 (Week 2): Pt will be able to lift Left arm actively to be able to reach under it during bathing.  Skilled Therapeutic Interventions/Progress Updates:    Pt received in recliner stating he was not feeling well.  I feel almost like my blood sugar is really really low and I have had too much to drink.  Pt stated his blood sugar was just assessed and it is not low but that is just how he feels.  -he stated that earlier today he was sitting with the tight binder on and his legs elevated in recliner and felt almost cramped and then started to feel like he could not breath. He was relieved of symptoms when binder opened and legs put down.  Discussed Dr Jyl education with him on AD and that he may have those symptoms with restrictive clothing.  Acknowledged that he took good action requesting for assist when he felt that way.   Pt stated he wanted to stay in the recliner until bed tonight. Agreeable to working on seated tasks. -discussed  his STGs, progress and pt participated in setting new STGs for this week -RUE much improved from my last visit with him on Thursday. (Pt has improved movement of R arm as he can now reach it up to touch the top of his head and lift it to use L hand to wash under R arm.  He now has active wrist extension and can grasp and pull using arm with fair strength. ) -recent shoulder xrays showed AC joint issues so used kinesiotape to support B shoulders over AC joint. Explained we will leave tape on until Thursday or Friday as long as it does not cause any discomfort. POLLY for sh  flexion and with use of UE Ranger for LUE -AAROM R wrist extension  -pt worked on active grasp of R hand with wrist extension with holding onto the straps of shopping bag and lifting shopping bag off of the floor level to above knee level -pt with elevated scapula, had pt work on active scapular depression by pushing down into pillow wedge with sustained hold as therapist massaged over upper traps.  Recommended to pt he work on 10 reps of this exercises at least each hour.   -pt continues to have difficulty managing his phone.  Recommended he ask his wife to pick up a phone stand to hold his phone more upright and a stylus.  A red foam handle can be put on the stylus for him to use in his R hand.  Despite not feeling well, pt continued to participate well to the best of his ability.  Pt resting in recliner with all needs met and belt alarm on.   Therapy Documentation Precautions:  Precautions Precautions: Fall Recall of Precautions/Restrictions: Intact Precaution/Restrictions Comments: B rotator cuff tears Required Braces or Orthoses: Cervical Brace Cervical Brace: Soft collar, Other (comment) Other Brace: Can remove when in bed and when showering. May apply/remove when sitting Restrictions Weight Bearing Restrictions Per Provider Order: No    Vital Signs: Therapy Vitals Temp: 97.7 F (36.5 C) Pulse Rate: 66 Resp: 19 BP: 100/74 Patient Position (if appropriate): Sitting Oxygen Therapy SpO2: 100 % O2 Device: Room Air Pain:  5/10 pain in upper shoulders and neck - pt states it is manageable with lidocane patches that he has on. ADL: ADL Eating: Dependent Where Assessed-Eating: Bed level Grooming: Dependent Where Assessed-Grooming: Wheelchair Upper Body Bathing: Maximal assistance Where Assessed-Upper Body Bathing: Sitting at sink Lower Body Bathing: Dependent Where Assessed-Lower Body Bathing: Sitting at sink Upper Body Dressing: Maximal assistance Where Assessed-Upper  Body Dressing: Wheelchair Lower Body Dressing: Dependent Where Assessed-Lower Body Dressing: Wheelchair Toileting: Dependent Where Assessed-Toileting: Toilet, Psychiatrist Transfer: Minimal Dentist Method: Surveyor, minerals: Bedside commode, Grab bars Tub/Shower Transfer: Not assessed Film/video editor: Not assessed    Therapy/Group: Individual Therapy  Sahiti Joswick 05/02/2024, 1:23 PM

## 2024-05-02 NOTE — Progress Notes (Signed)
 Attempted to educate wife on I/O cath, per wife she would like to begin education tomorrow.   Geni Armor, LPN

## 2024-05-03 LAB — GLUCOSE, CAPILLARY
Glucose-Capillary: 136 mg/dL — ABNORMAL HIGH (ref 70–99)
Glucose-Capillary: 209 mg/dL — ABNORMAL HIGH (ref 70–99)
Glucose-Capillary: 143 mg/dL — ABNORMAL HIGH (ref 70–99)
Glucose-Capillary: 174 mg/dL — ABNORMAL HIGH (ref 70–99)

## 2024-05-03 NOTE — Progress Notes (Signed)
 Occupational Therapy Session Note  Patient Details  Name: MILLIE SHORB MRN: [id number] Date of Birth: 1950-11-03  Today's Date: 05/03/2024 OT Individual Time: 1005-1045 OT Individual Time Calculation (min): 40 min    Short Term Goals: Week 2:  OT Short Term Goal 1 (Week 2): Pt will have improved standing tolerance to be able to manage his clothing over his hips to prepare for toileting tasks. OT Short Term Goal 2 (Week 2): Pt will have improved standing tolerance to be able to tolerate standing long enough to pivot to a BSC with CGA or less. OT Short Term Goal 3 (Week 2): Pt will demonstrate improved B hand strength to use hands to pull pants over hips with mod A or less. OT Short Term Goal 4 (Week 2): Pt will be able to lift Left arm actively to be able to reach under it during bathing.  Skilled Therapeutic Interventions/Progress Updates:  Skilled OT intervention completed with focus on BP management and functional transfers. Pt received upright in recliner, agreeable to session. No pain reported.  Pt reported being in recliner for over an hour. BP assessed due to recent trend with orthostatic hypotension. See below: Vitals BP 81/66 (seated with abdominal binder, ACE wrap and TED), HR 63 bpm BP 77/55 (seated after BLE exercises), HR 64 bpm BP 145/78 (reclined & legs elevated) BP 78/60 (seated EOB after stand pivot), HR 72 bpm BP 140/85 (supine in bed), HR 53 bpm  Pt with increasing dizziness despite BLE elevated and pt preferring bed level for upcoming cath. +2 present for safety due to low BP, but pt required min A sit > stand and stand pivot with RW > EOB. Dependent for doffing shoes, and min A sit > supine transition. Refilled pt's adaptive cup and water pitcher with fresh water- encouraged fluids. Nursing present and aware of BP status. Doffed abdominal binder. Pt remained semi upright in bed, with bed alarm on/activated, and with all needs in reach at end of session.   Therapy  Documentation Precautions:  Precautions Precautions: Fall Recall of Precautions/Restrictions: Intact Precaution/Restrictions Comments: B rotator cuff tears Required Braces or Orthoses: Cervical Brace Cervical Brace: Soft collar, Other (comment) Other Brace: Can remove when in bed and when showering. May apply/remove when sitting Restrictions Weight Bearing Restrictions Per Provider Order: No   Therapy/Group: Individual Therapy  Lorrayne FORBES Fritter, MS, OTR/L  05/03/2024, 10:48 AM

## 2024-05-03 NOTE — Progress Notes (Signed)
 Physical Therapy Session Note  Patient Details  Name: Jerome Vaughn MRN: [id number] Date of Birth: Aug 29, 1951  Today's Date: 05/03/2024 PT Individual Time: 1415-1528 PT Individual Time Calculation (min): 73 min   Short Term Goals: Week 2:  PT Short Term Goal 1 (Week 2): Pt will be able to maintain seated upright in w/c >2 hours PT Short Term Goal 2 (Week 2): Pt will tolerate standing activity > 2 min PT Short Term Goal 3 (Week 2): Pt will initiate stair training  Skilled Therapeutic Interventions/Progress Updates:      Pt sitting up in recliner - wearing abdominal binder and has BLE ace wrapped. Patient denies any pain or lightheadedness. Pt wearing soft cervical collar during treatment.   Sit<>stand and stand pivot transfer completed with minA from recliner to wheelchair. He was then transported to main gym.  BP sitting in w/c: 99/71 HR 63  Pt agreeable to continue to work through some of the mild lightheadedness. Sit<>stand to EVA walker with minA for powering to rise. He completed gait training 2x5ft with CGA and EVA walker with cues for upright, widening BOS, and deep breathing.   Pt continued to work on upright and standing activities to challenge cardiovascular endurance and tolerance to upright. Pt completed standing foot taps to bottom 6 step with BUE support on hand rails. Completed 2x7 with seated rest breaks b/w sets.   Pt taken to [facility] room gym and assisted onto the Nustep with minA stand pivot transfer. Setup on seated Nustep for BLE activity only for strengthening and endurance. Completed at L1 resistance for 15 minutes without a rest break or any change in symptoms.  1070 steps, 0.6 miles, 71 spm average.   MinA for stand pivot transfer to return to wheelchair and patient taken back to his room. Wife requesting patient change clothing for visitors. Changed out of LB/UB dressing with maxA for time management with wife assisting. Several stands completed during dressing at [facility]  level. Patient left sitting up in recliner with BLE elevated and needs met, seat belt alarm on.   Therapy Documentation Precautions:  Precautions Precautions: Fall Recall of Precautions/Restrictions: Intact Precaution/Restrictions Comments: B rotator cuff tears Required Braces or Orthoses: Cervical Brace Cervical Brace: Soft collar, Other (comment) Other Brace: Can remove when in bed and when showering. May apply/remove when sitting Restrictions Weight Bearing Restrictions Per Provider Order: No General:      Therapy/Group: Individual Therapy  Sherlean SHAUNNA Perks 05/03/2024, 7:41 AM

## 2024-05-03 NOTE — Progress Notes (Signed)
 Physical Therapy Session Note  Patient Details  Name: Jerome Vaughn MRN: [id number] Date of Birth: 01-14-1951  Today's Date: 05/03/2024 PT Individual Time: 0800-0845 PT Individual Time Calculation (min): 45 min   Short Term Goals: Week 2:  PT Short Term Goal 1 (Week 2): Pt will be able to maintain seated upright in w/c >2 hours PT Short Term Goal 2 (Week 2): Pt will tolerate standing activity > 2 min PT Short Term Goal 3 (Week 2): Pt will initiate stair training  Skilled Therapeutic Interventions/Progress Updates:     Session 1:  Pt recd in bed, sitting upright with nsg staff present for meds pass and while setting up meal tray. Pt completed meal during session, primarily using LUE for self-feeding. Session focused on transitioning into upright for improved upright tolerance and BP management. Donned prophylactics dependently. Vitals reported below. No complaint of pain.    Bed mobility with supervision. Min a HHA for Stand pivot transfer to recliner during session. Pt remained in recliner at end of session and was left with all needs in reach and alarm active.   Ted hose and ace wraps donned for all vitals taken below.  Semi-fowlers at rest: 112/76 (88) 66bpm Seated EOB w/ abd binder: 103/64 (76) 72bpm (little lightheaded)   w/ continued rest: 91/67 (75) 80bpm (feeling heat around my neck) Recliner w/ legs elevated: 102/73 (84) 68bpm (feeling better)  Session 2:  pt received fully supine in bed and agreeable to therapy. No complaint of pain. Session focused on return to recliner for improved upright tolerance and BP management. All prophylactics in place.  Pt noted to tolerate a quicker transition to sitting upright vs earlier session, but noted elevated BP. Continued education provided on maintaining upright as tolerated and avoiding supine during the [place], especially as pt is on several meds to increase BP. Vitals as listed below. Pt remained up in recliner with legs elevated and  instructions to ask for assistance to change positions if he becomes symptomatic.   Supine: 151/90 (107) 52bpm HOB elevated to 50 degrees: 134/85(101) 55 bpm HOB elevated to 60deg: 139/80 (98) 62 bpm Seated OEB: 149/114(124) 106 bpm (asymptomatic) Recliner w/ legs elevated: 134/92 (106) 56bpm  : 141/90 (106) 60bpm  : 138/81 (99) 54bpm    Therapy Documentation Precautions:  Precautions Precautions: Fall Recall of Precautions/Restrictions: Intact Precaution/Restrictions Comments: B rotator cuff tears Required Braces or Orthoses: Cervical Brace Cervical Brace: Soft collar, Other (comment) Other Brace: Can remove when in bed and when showering. May apply/remove when sitting Restrictions Weight Bearing Restrictions Per Provider Order: No   Therapy/Group: Individual Therapy  [name] 05/03/2024, 8:19 AM

## 2024-05-03 NOTE — Progress Notes (Addendum)
 Patient ID: Jerome Vaughn, male   DOB: 01/25/1951, 73 y.o.   MRN: [id number]  SW met with pt in room to provide updates from team conference, and d/c date change from 8/14 to 8/19. He is open to performing his own I/O cath. SW shared will need to set up fam edu. Reports his wife will be here later today. SW will follow-up with his wife.   *SW me with pt wife to discuss above. Fam edu scheduled for Tuesday 1p-4pm. Wife will begin I/O cath edu with nursing.   Graeme Jude, MSW, LCSW Office: [phone] Cell: [phone] Fax: [phone]
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
## 2024-05-03 NOTE — Plan of Care (Signed)
  Problem: Consults Goal: RH SPINAL CORD INJURY PATIENT EDUCATION Description:  See Patient Education module for education specifics.  05/03/2024 1055 by Kit Geni LABOR, LPN Outcome: Progressing 05/03/2024 1055 by Kit Geni LABOR, LPN Outcome: Progressing   Problem: SCI BOWEL ELIMINATION Goal: RH STG MANAGE BOWEL WITH ASSISTANCE Description: STG Manage Bowel with supervision-min Assistance. 05/03/2024 1055 by Kit Geni LABOR, LPN Outcome: Progressing 05/03/2024 1055 by Kit Geni LABOR, LPN Outcome: Progressing   Problem: SCI BLADDER ELIMINATION Goal: RH STG MANAGE BLADDER WITH ASSISTANCE Description: STG Manage Bladder With supervision-min Assistance 05/03/2024 1055 by Kit Geni A, LPN Outcome: Progressing 05/03/2024 1055 by Kit Geni A, LPN Outcome: Progressing   Problem: RH SKIN INTEGRITY Goal: RH STG SKIN FREE OF INFECTION/BREAKDOWN Description: Manage skin free of infection with supervision - min assistance 05/03/2024 1055 by Kit Geni A, LPN Outcome: Progressing 05/03/2024 1055 by Kit Geni A, LPN Outcome: Progressing   Problem: RH SAFETY Goal: RH STG ADHERE TO SAFETY PRECAUTIONS W/ASSISTANCE/DEVICE Description: STG Adhere to Safety Precautions With supervision- min Assistance/Device. 05/03/2024 1055 by Kit Geni LABOR, LPN Outcome: Progressing 05/03/2024 1055 by Kit Geni LABOR, LPN Outcome: Progressing   Problem: RH PAIN MANAGEMENT Goal: RH STG PAIN MANAGED AT OR BELOW PT'S PAIN GOAL Description: <4 w/ prns 05/03/2024 1055 by Kit Geni A, LPN Outcome: Progressing 05/03/2024 1055 by Kit Geni A, LPN Outcome: Progressing   Problem: RH KNOWLEDGE DEFICIT SCI Goal: RH STG INCREASE KNOWLEDGE OF SELF CARE AFTER SCI Description: Manage increase knowledge of self care after SCI with supervision- min assistance from wife using educational materials provided 05/03/2024 1055 by Kit Geni LABOR, LPN Outcome: Progressing 05/03/2024 1055 by Kit Geni LABOR, LPN Outcome:  Progressing   Problem: Education: Goal: Ability to describe self-care measures that may prevent or decrease complications (Diabetes Survival Skills Education) will improve 05/03/2024 1055 by Kit Geni LABOR, LPN Outcome: Progressing 05/03/2024 1055 by Kit Geni LABOR, LPN Outcome: Progressing Goal: Individualized Educational Video(s) 05/03/2024 1055 by Kit Geni LABOR, LPN Outcome: Progressing 05/03/2024 1055 by Kit Geni A, LPN Outcome: Progressing   Problem: Coping: Goal: Ability to adjust to condition or change in health will improve 05/03/2024 1055 by Kit Geni A, LPN Outcome: Progressing 05/03/2024 1055 by Kit Geni A, LPN Outcome: Progressing   Problem: Fluid Volume: Goal: Ability to maintain a balanced intake and output will improve 05/03/2024 1055 by Kit Geni A, LPN Outcome: Progressing 05/03/2024 1055 by Kit Geni LABOR, LPN Outcome: Progressing   Problem: Health Behavior/Discharge Planning: Goal: Ability to identify and utilize available resources and services will improve 05/03/2024 1055 by Kit Geni A, LPN Outcome: Progressing 05/03/2024 1055 by Kit Geni A, LPN Outcome: Progressing Goal: Ability to manage health-related needs will improve 05/03/2024 1055 by Kit Geni A, LPN Outcome: Progressing 05/03/2024 1055 by Kit Geni A, LPN Outcome: Progressing   Problem: Metabolic: Goal: Ability to maintain appropriate glucose levels will improve 05/03/2024 1055 by Kit Geni A, LPN Outcome: Progressing 05/03/2024 1055 by Kit Geni A, LPN Outcome: Progressing   Problem: Nutritional: Goal: Maintenance of adequate nutrition will improve Outcome: Progressing Goal: Progress toward achieving an optimal weight will improve Outcome: Progressing   Problem: Skin Integrity: Goal: Risk for impaired skin integrity will decrease Outcome: Progressing   Problem: Tissue Perfusion: Goal: Adequacy of tissue perfusion will improve Outcome: Progressing
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
## 2024-05-03 NOTE — Plan of Care (Signed)
  Problem: SCI BOWEL ELIMINATION Goal: RH STG MANAGE BOWEL WITH ASSISTANCE Description: STG Manage Bowel with supervision-min Assistance. Outcome: Progressing   Problem: SCI BLADDER ELIMINATION Goal: RH STG MANAGE BLADDER WITH ASSISTANCE Description: STG Manage Bladder With supervision-min Assistance Outcome: Progressing   Problem: RH SKIN INTEGRITY Goal: RH STG SKIN FREE OF INFECTION/BREAKDOWN Description: Manage skin free of infection with supervision - min assistance Outcome: Progressing   Problem: RH SAFETY Goal: RH STG ADHERE TO SAFETY PRECAUTIONS W/ASSISTANCE/DEVICE Description: STG Adhere to Safety Precautions With supervision- min Assistance/Device. Outcome: Progressing   Problem: RH PAIN MANAGEMENT Goal: RH STG PAIN MANAGED AT OR BELOW PT'S PAIN GOAL Description: <4 w/ prns Outcome: Progressing

## 2024-05-04 DIAGNOSIS — F54 Psychological and behavioral factors associated with disorders or diseases classified elsewhere: Secondary | ICD-10-CM

## 2024-05-04 LAB — GLUCOSE, CAPILLARY
Glucose-Capillary: 122 mg/dL — ABNORMAL HIGH (ref 70–99)
Glucose-Capillary: 154 mg/dL — ABNORMAL HIGH (ref 70–99)
Glucose-Capillary: 135 mg/dL — ABNORMAL HIGH (ref 70–99)
Glucose-Capillary: 195 mg/dL — ABNORMAL HIGH (ref 70–99)

## 2024-05-04 NOTE — Plan of Care (Signed)
  Problem: Consults Goal: RH SPINAL CORD INJURY PATIENT EDUCATION Description:  See Patient Education module for education specifics.  Outcome: Progressing   Problem: SCI BOWEL ELIMINATION Goal: RH STG MANAGE BOWEL WITH ASSISTANCE Description: STG Manage Bowel with supervision-min Assistance. Outcome: Progressing   Problem: SCI BLADDER ELIMINATION Goal: RH STG MANAGE BLADDER WITH ASSISTANCE Description: STG Manage Bladder With supervision-min Assistance Outcome: Progressing   Problem: RH SKIN INTEGRITY Goal: RH STG SKIN FREE OF INFECTION/BREAKDOWN Description: Manage skin free of infection with supervision - min assistance Outcome: Progressing   Problem: RH SAFETY Goal: RH STG ADHERE TO SAFETY PRECAUTIONS W/ASSISTANCE/DEVICE Description: STG Adhere to Safety Precautions With supervision- min Assistance/Device. Outcome: Progressing

## 2024-05-04 NOTE — Progress Notes (Signed)
 Physical Therapy Session Note  Patient Details  Name: Jerome Vaughn MRN: [id number] Date of Birth: 1951/06/02  Today's Date: 05/04/2024 PT Individual Time: 0800-0810 + [phone] + 1300-1356 PT Individual Time Calculation (min): 10 min  + 40 min + 56 min  Short Term Goals: Week 2:  PT Short Term Goal 1 (Week 2): Pt will be able to maintain seated upright in w/c >2 hours PT Short Term Goal 2 (Week 2): Pt will tolerate standing activity > 2 min PT Short Term Goal 3 (Week 2): Pt will initiate stair training  Skilled Therapeutic Interventions/Progress Updates:      1st session: Pt in bed on arrival. Reports mild B shoulder soreness but no specific pain. Offered heating and mobility for pain management. Began donning compression stockings at bed level, patient reporting urgent need for BM and requesting the bed pan. Required assist for lowering pants and brief which were accomplished via bridging. Bed plan placed and NT made aware of patient position. Pt requesting + time for privacy and to toilet. Pt missed 25 minutes.    2nd session: Returned at [phone] and patient in bed, ready for therapy. Donned thigh high compressions and wrapped BLE with ace wrap. Abdominal binder donned at bed level as well. Supine<>sitting EOB with supervision. Sit<>stand and stand pivot transfer to wheelchair with CGA/minA. Transported to [facility] room gym.  BP checked before mobility training: 109/82.   Sit<>stand to EVA walker with CGA from w/c height. Gait training within [place] room for safety - completed >175ft of mobility training with EVA walker and CGA for balance. Patient denies any symptoms of lightheadedness or dizziness.   Pt returned to his room and rechecked BP with patient sitting in wheelchair: 116/78.  Pt left in wheelchair with soft call bell and call bell in reach. Pt aware of upcoming therapy treatment.    3rd session: Pt in bed to start - wearing abdominal binder, BLE ace wrapped, and thigh high  compression socks on. Pt reports no pain.   Bed mobility completed at supervision level with HOB elevated. Able to accomplish sit<>Stand and stand pivot transfer with CGA and no AD from EOB to wheelchair. OT switched out his lightweight w/c for TIS w/c.   Transported patient in w/c to main gym.   BP checked sitting: 131/76 HR 60  Pt placed in // bars to work on upright standing tolerance and BLE strengthening: -1x12 standing heel raises -1x12 partial squats -1x12 high and controlled marching in place *BP checked sitting: 112/76 HR 64 -1x12 hip abd/add bilaterally -1x12 side stepping L<>R -1x12 repeated sit<>stands *BP checked sitting: 108/86 HR 72  Pt returned to his room and assisted to the recliner with CGA stand step transfer. Patient left with BLE elevated and wife at the bedside. Pillow supporting his back. All needs met at end of session.    Therapy Documentation Precautions:  Precautions Precautions: Fall Recall of Precautions/Restrictions: Intact Precaution/Restrictions Comments: B rotator cuff tears Required Braces or Orthoses: Cervical Brace Cervical Brace: Soft collar, Other (comment) Other Brace: Can remove when in bed and when showering. May apply/remove when sitting Restrictions Weight Bearing Restrictions Per Provider Order: No General:      Therapy/Group: Individual Therapy  Jerome Vaughn 05/04/2024, 7:45 AM

## 2024-05-04 NOTE — Consult Note (Signed)
 Neuropsychological Consultation Comprehensive [facility]patient [facility]   Patient:   Jerome Vaughn   DOB:   11/29/1950  MR Number:  [id number]  [facility] [address] [place] [address] Dept: [phone] Loc: [phone]           Date of Service:   05/04/2024  Start Time:   2 PM End Time:   3 PM  Provider/Observer:  Norleen Asa, Psy.D.       Clinical Neuropsychologist       Billing Code/Service: [phone]  Reason for Service:    [name] is a 73 year old male referred for neuropsychological consultation due to coping and adjustment issues with recent cervical spinal cord injury and currently admitted to the comprehensive inpatient rehabilitation unit.  History of Present Illness: Admitted on 04/15/2024 following a traumatic injury. While cutting a tree branch on a roof, the branch fell and struck him in the shoulder and neck area knocking him to the roof where he was ultimately able to crawl to hold onto the [place]. Denies loss of consciousness at the time of the incident. Was found to be in shock and required transfusion.  Past Medical History: - Diabetes mellitus - Coronary artery disease, s/p CABG in 2017 - Hypertension - Hyperlipidemia - Bilateral rotator cuff tears, diagnosed approximately 1.5 years ago. Managed with Tylenol  and Biofreeze. Reports a history of a steroid injection for this.  Social History: - Lives with spouse and family. 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  Hospital Course and Investigations: - Cranial CT scan upon admission showed slight frontal scalp edema with no intracranial abnormality or skull fracture. - CT of the cervical spine noted widening of the left C3-4 facet, without jumped or perched facets. - MRI of the cervical spine showed focal hyperintense T2 weighted signal within the spinal cord at C3-4 and C7, suggestive of  possible myelomalacia or spinal cord edema. Findings also included severe spinal canal stenosis and bilateral neuroforaminal stenosis at C6-7, and mild spinal canal stenosis with severe bilateral foraminal stenosis at C3-4. - Underwent neurosurgical intervention with arthrodesis at C6-7, including discectomy for decompression of the spinal cord and nerve roots. - Subsequently admitted to the comprehensive inpatient rehabilitation unit for decreased functional mobility.  Neuropsychological Consultation: The patient was seen for an initial consultation to assess his adjustment to his injury and hospitalization. He is alert and oriented. He recalls the details of the accident clearly and confirmed he did not lose consciousness. He described the immediate onset of difficulty moving his body, particularly his arms which were trapped beneath him. He was able to maneuver his arms free and hold onto the ridge cap of the roof to prevent falling.  He reports his right side was more affected initially, specifically his right hand. He is right-hand dominant. He reports initial swelling and difficulty with grip strength in his right hand, which has been improving. He notes some continued deficits in both hands but has regained more control and is able to perform finger opposition with his left hand, which he could not do previously. His primary focus is on regaining hand function.  He is aware of the nature of his spinal injury and the surgical intervention performed. He reports that his wife has a similar history of a spinal issue.  Mood and Coping: The patient displays a positive attitude and reports being motivated for rehabilitation. He denies feelings of  depression, stating his focus is on avoiding both physical and mental decline. He feels that the staff's positive attitude towards him is a reflection of his own positive outlook. He acknowledges the realization that the accident was a bonehead move but is  focused on his recovery. He has been encouraged by his progress, such as transitioning from using a walker to walking without one within a week of being on the rehabilitation unit. He has shared videos of his progress with family to reassure them.  Psychoeducation and Plan: - The purpose of the neuropsychology consult was explained. - The rationale for his admission to the inpatient rehabilitation unit was discussed, emphasizing that his presence on the unit signifies the treatment team's belief in his potential for significant recovery. - The recovery trajectory after spinal cord injury was reviewed. It was explained that most neurological recovery occurs within the first 6-12 months. Symptoms present after one year are more likely to be long-term, but functional improvements can continue. - The importance of active participation in therapies (PT, OT) was emphasized as a means to stimulate nerve healing and maximize recovery. The expertise of the rehabilitation team, including the physiatrist, was highlighted. - The patient's vagus nerve involvement  was briefly discussed in the context of blood pressure regulation, noting that his increasing stability of blood pressure stability is a positive sign of healing. - The possibility of transient neurological symptoms (e.g., unusual sensations) as nerves heal was discussed, advising him to report new symptoms but not to overinterpret them as they can be a sign of recovery. - The discharge planning process was reviewed. It was explained that the tentative discharge date of the 19th is a prediction based on expected progress. As he approaches discharge, his wife will receive any necessary training from PT/OT to manage his needs at home. The goal is a successful discharge where he can continue to improve at home. - The patient and his wife were engaged and receptive to the information provided. He affirmed his motivation to continue working hard in therapy. He is  using adaptive utensils provided by his wife. - Will continue to monitor his psychological adjustment and progress throughout his inpatient stay.  Medical History:   Past Medical History:  Diagnosis Date   Carpal tunnel syndrome    both hands   Colon polyps    Coronary artery disease    sees Dr. [name]   Diabetes mellitus    sees Dr. [name]   Diabetes mellitus [facility]) 2009   Ganglion cyst of wrist, right 1989   GERD (gastroesophageal reflux disease)    History of echocardiogram    a. Echo 4/17: Moderate LVH, EF 50-55%, mild LAE, no pericardial effusion   Hyperlipidemia    Hypertension    MGUS (monoclonal gammopathy of unknown significance)    sees Dr. [name]   Neuropathy    gets foot exams with Dr. [name] (podiatry)   Pneumonia    Rotator cuff injury          Patient Active Problem List   Diagnosis Date Noted   Central cord syndrome (HCC) 04/21/2024   Acute incomplete quadriplegia (HCC) 04/21/2024   Shock (HCC) 04/15/2024   Varicose veins of both lower extremities 07/03/2023   Full thickness rotator cuff tear 02/26/2023   Type 2 diabetes mellitus with diabetic polyneuropathy, without long-term current use of insulin  (HCC) 02/02/2023   Pancytopenia (HCC) 01/09/2023   OA (osteoarthritis) of knee 04/18/2022   Left shoulder pain 04/18/2022  GERD (gastroesophageal reflux disease)    Bacterial infection due to H. pylori 03/01/2020   Monoclonal gammopathy of unknown significance (MGUS) 03/01/2020   Iron  deficiency anemia 02/22/2020   Onychomycosis 02/08/2020   Rectal bleeding 01/24/2020   Indigestion 01/24/2020   Angina pectoris (HCC) 12/12/2018   Erectile disorder due to medical condition in male 04/08/2018   Chronic combined systolic (congestive) and diastolic (congestive) heart failure (HCC) 07/29/2017   Pericardial effusion 12/24/2015   Type 2 diabetes mellitus with complication, without long-term current use of insulin  (HCC) 12/10/2015   S/P CABG  x 5 11/22/2015   Abnormal stress test 11/21/2015   Coronary artery disease of bypass graft of native heart with stable angina pectoris (HCC)    Nonspecific abnormal electrocardiogram (ECG) (EKG) 11/07/2015   Essential hypertension 03/30/2011   Hypercholesterolemia 03/30/2011   Colon polyp 03/30/2011    Behavioral Observation/Mental Status:   Maksym E Keeven  presents as a 73 y.o.-year-old Right handed African American Male who appeared his stated age. his dress was Appropriate and he was Well Groomed and his manners were Appropriate to the situation.  his participation was indicative of Appropriate and Attentive behaviors.  There were physical disabilities noted.  he displayed an appropriate level of cooperation and motivation.    Interactions:    Active Appropriate  Attention:   within normal limits and attention span and concentration were age appropriate  Memory:   within normal limits; recent and remote memory intact  Visuo-spatial:   not examined  Speech (Volume):  normal  Speech:   normal; normal  Thought Process:  Coherent and Relevant  Coherent, Linear, and Logical  Though Content:  WNL; not suicidal and not homicidal  Orientation:   person, place, time/date, and situation  Judgment:   Good  Planning:   Good  Affect:    Appropriate  Mood:    Dysphoric  Insight:   Good  Intelligence:   normal  Psychiatric History:  No prior psychiatric history noted  Abuse/Trauma History: Patient recently had a significant cervical injury after attempting to cut down a large tree limb of over his roof that resulted in an accident striking him in the shoulder and neck resulting in spinal cord injury patient denies any flashbacks or nightmares around this event.  History of Substance Use or Abuse:  No concerns of substance abuse are reported.    Family Med/Psych History:  Family History  Problem Relation Age of Onset   Heart failure Father    Hyperlipidemia Sister    Diabetes  Sister    Hypertension Sister    Kidney failure Sister    Hyperlipidemia Sister    Diabetes Sister    Colon polyps Sister    Kidney disease Sister    Anuerysm Brother        AAA   Other Brother        COVID 19   Diabetes Maternal Grandmother    Diabetes Son    Heart disease Son    Heart attack Neg Hx    Colon cancer Neg Hx    Stomach cancer Neg Hx    Esophageal cancer Neg Hx    Pancreatic cancer Neg Hx    Impression/DX:   The patient was seen for an initial consultation to assess his adjustment to his injury and hospitalization. He is alert and oriented. He recalls the details of the accident clearly and confirmed he did not lose consciousness. He described the immediate onset of difficulty moving his body, particularly  his arms which were trapped beneath him. He was able to maneuver his arms free and hold onto the ridge cap of the roof to prevent falling.  He reports his right side was more affected initially, specifically his right hand. He is right-hand dominant. He reports initial swelling and difficulty with grip strength in his right hand, which has been improving. He notes some continued deficits in both hands but has regained more control and is able to perform finger opposition with his left hand, which he could not do previously. His primary focus is on regaining hand function.  He is aware of the nature of his spinal injury and the surgical intervention performed. He reports that his wife has a similar history of a spinal issue.           Electronically Signed   _______________________ Norleen Asa, Psy.D. Clinical Neuropsychologist
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
## 2024-05-04 NOTE — Progress Notes (Signed)
 Occupational Therapy Session Note  Patient Details  Name: Jerome Vaughn MRN: [id number] Date of Birth: [birth year]  Today's Date: 05/04/2024 OT Individual Time: 1000-1045 OT Individual Time Calculation (min): 45 min    Short Term Goals: Week 1:  OT Short Term Goal 1 (Week 1): Pt will consistently direct care with family and staff regarding use/need of AE and orthotics for self feeding and/or daily tasks. OT Short Term Goal 1 - Progress (Week 1): Met OT Short Term Goal 2 (Week 1): Pt will completed UB dressing with Max A and use of compensatory techniques. OT Short Term Goal 2 - Progress (Week 1): Met OT Short Term Goal 3 (Week 1): Pt will completed simple grooming task such as brushing teeth with Max A and use of adaptive equipment OT Short Term Goal 3 - Progress (Week 1): Met  Skilled Therapeutic Interventions/Progress Updates:    1:1 Pt received in the recliner and reports feeling a litte weak BP taken and was 112/60s . Participated in bathing and dressing at sink level. Pt able to bathe UB with A for washing left LE since right arm is painful with reaching over to left UE. Pt able to doff shirt with min A and don clean shirt with mod A. Pt able to thread pants and pull then up and down with min A and extra time. PT does needed total A for washing and donning legs and TEDS/ ace wraps and shoes.   Pt's w/c and recliner switched out for TIS to sit in for longer periods of time to help keep legs elevated for BP - pt reports recliner with LEs elevated is too hard with tighter hamstrings.  Pt was able to performed shaving 75 % of the task- anticipate pt will be able to self cath himself. Encouraged pt to try regular underwear instead of brief.  PT's BP when left in TIS was 141/48.   Therapy Documentation Precautions:  Precautions Precautions: Fall Recall of Precautions/Restrictions: Intact Precaution/Restrictions Comments: B rotator cuff tears Required Braces or Orthoses: Cervical  Brace Cervical Brace: Soft collar, Other (comment) Other Brace: Can remove when in bed and when showering. May apply/remove when sitting Restrictions Weight Bearing Restrictions Per Provider Order: No  Pain: No c/o pain   Therapy/Group: Individual Therapy  [name] Nest [facility] 05/04/2024, 3:10 PM

## 2024-05-05 LAB — GLUCOSE, CAPILLARY
Glucose-Capillary: 136 mg/dL — ABNORMAL HIGH (ref 70–99)
Glucose-Capillary: 168 mg/dL — ABNORMAL HIGH (ref 70–99)
Glucose-Capillary: 123 mg/dL — ABNORMAL HIGH (ref 70–99)
Glucose-Capillary: 212 mg/dL — ABNORMAL HIGH (ref 70–99)

## 2024-05-05 NOTE — Progress Notes (Signed)
 Physical Therapy Session Note  Patient Details  Name: Jerome Vaughn MRN: [id number] Date of Birth: 23-Oct-1950  Today's Date: 05/05/2024 PT Individual Time: 1420-1535 PT Individual Time Calculation (min): 75 min   Short Term Goals: Week 2:  PT Short Term Goal 1 (Week 2): Pt will be able to maintain seated upright in w/c >2 hours PT Short Term Goal 2 (Week 2): Pt will tolerate standing activity > 2 min PT Short Term Goal 3 (Week 2): Pt will initiate stair training  Skilled Therapeutic Interventions/Progress Updates: Pt presented in bed agreeable to therapy. Pt states some mild discomfort in BUE/shoulders but feels that current pain regimen is good and no intervention requested at this time. Pt completed supine therex prior to EOB for increased cardiovascular output prior to mobility. Pt completed ankle pumps, heel slides, hip abd/add at 25 and 45 degrees. Transferred to sitting with CGA. Pt completed LAQ ankle pumps, and hip flexion to fatigue prior to standing. Completed stand step transfer to TIS with RW and CGA. Pt transferred to ortho gym and completed ambulatory transfer in same manner as prior to NuStep. Participated in NuStep L4 x 5:30 BE only then up to L5 x 2 min for increased endurance. BP assessed after NuStep as noted below. Completed ambulatory transfer back to TIS and transported to main gym. Pt then participated in gait with RW standing with CGA and ambulating ~184ft with RW, including x 3 turns. Performed toe taps to 6in step at stairs alternating x 10 bilaterally then performed same activity to second step. Pt then transported partial distance past 4W nsg station then pt ambulated remaining distance back to room with RW in same manner as prior.  Pt completed sit to supine with supervision to bed flat. Pt repositioned to comfort and left in bed at end of session with bed alarm on, call bell within reach and needs met.    All prophylactics donned Supine (25 degrees) with 162/82 (107) HR  57 Semi Fowlers (45 degrees) 150/89 (108) HR 69 EOB  0 min 124/75 (89) HR 73 EOB after 3 min 104/78 (88) HR 79 After NuStep 103/68 (80) HR 79      Therapy Documentation Precautions:  Precautions Precautions: Fall Recall of Precautions/Restrictions: Intact Precaution/Restrictions Comments: B rotator cuff tears Required Braces or Orthoses: Cervical Brace Cervical Brace: Soft collar, Other (comment) Other Brace: Can remove when in bed and when showering. May apply/remove when sitting Restrictions Weight Bearing Restrictions Per Provider Order: No General:   Vital Signs: Therapy Vitals Pulse Rate: (!) 57 Resp: 18 BP: 134/75 Patient Position (if appropriate): Lying Oxygen Therapy SpO2: 99 % O2 Device: Room Air Pain: Pain Assessment Pain Scale: 0-10 Pain Score: 2  Pain Location: Neck Pain Intervention(s): Medication (See eMAR);Repositioned   Therapy/Group: Individual Therapy  Kavi Almquist 05/05/2024, 3:55 PM

## 2024-05-05 NOTE — Progress Notes (Signed)
 Occupational Therapy Session Note  Patient Details  Name: Jerome Vaughn MRN: [id number] Date of Birth: 06-16-1951  Today's Date: 05/05/2024 OT Individual Time: 1050-1145 OT Individual Time Calculation (min): 55 min    Short Term Goals: Week 2:  OT Short Term Goal 1 (Week 2): Pt will have improved standing tolerance to be able to manage his clothing over his hips to prepare for toileting tasks. OT Short Term Goal 2 (Week 2): Pt will have improved standing tolerance to be able to tolerate standing long enough to pivot to a BSC with CGA or less. OT Short Term Goal 3 (Week 2): Pt will demonstrate improved B hand strength to use hands to pull pants over hips with mod A or less. OT Short Term Goal 4 (Week 2): Pt will be able to lift Left arm actively to be able to reach under it during bathing.  Skilled Therapeutic Interventions/Progress Updates:  Pt greeted seated in TIS, pt agreeable to OT intervention.       Therapeutic activity:  Pt completed seated FMC task with instructed to use weighted clothespin to remove pegs from board with a focus on improving dominant hand grip strength for ADL participation. Pt completed task with + time but overall supervision.   Rep from catheter company present during session to provide education on manipulation of ez go catheter. Rep provided demo and education of ez go mechanism to be carried over with primary OT.   Pt able to return demo manipulation of slider mechanism with increased time with L hand supporting tip and RUE completing sliding mechanism.    Ended session with pt seated in w/c with all needs within reach.  Therapy Documentation Precautions:  Precautions Precautions: Fall Recall of Precautions/Restrictions: Intact Precaution/Restrictions Comments: B rotator cuff tears Required Braces or Orthoses: Cervical Brace Cervical Brace: Soft collar, Other (comment) Other Brace: Can remove when in bed and when showering. May apply/remove when  sitting Restrictions Weight Bearing Restrictions Per Provider Order: No  Pain: No pain     Therapy/Group: Individual Therapy  Ronal Mallie Needy 05/05/2024, 12:19 PM

## 2024-05-05 NOTE — Progress Notes (Signed)
 Low dexterity catheter reviewed with patient and wife. Showed video and let patient practice with the catheter. Nurse made aware and asked to follow -up at 6pm catheterization.

## 2024-05-05 NOTE — Progress Notes (Signed)
 No Bowel Program preformed d/t BM 8/7.
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
## 2024-05-05 NOTE — Progress Notes (Signed)
 Physical Therapy Session Note  Patient Details  Name: Jerome Vaughn MRN: [id number] Date of Birth: 1951-04-24  Today's Date: 05/05/2024 PT Individual Time: 9154-9054 PT Individual Time Calculation (min): 60 min   Short Term Goals: Week 2:  PT Short Term Goal 1 (Week 2): Pt will be able to maintain seated upright in w/c >2 hours PT Short Term Goal 2 (Week 2): Pt will tolerate standing activity > 2 min PT Short Term Goal 3 (Week 2): Pt will initiate stair training  Skilled Therapeutic Interventions/Progress Updates:   Pt received supine in bed, required max A to don ted hose and ace wraps (pt able to lift BLE to help). Pt required supervision bed mobility, requested additional time seated EOB to get adjusted before standing w/ RW w/ CGA for safety. Pt denied any pain throughout session. Pt ambulated 167ft to main gym to improve functional activity tolerance, using RW and CGA for safety. Pt ambulated w/ reciprocal gait pattern and trendelenberg sign in L stance phase, indicating weak L glute medius. Outside the main gym door, pt experienced near syncopal episode requiring modA to prevent fall. Pt came to moments later and attempted to reinitiate gait but followed cue to sit and rest. Pt BP 102/70 (81) 78bpm. After several minutes of rest and pt stating his symptoms had subsided, pt was educated on role of L hip abductors. Pt performed the following resisted hip abduction exercises w/ blue TB to facilitate neuromuscular re-education:    -Seated hip abduction, feet on step to discourage momentum, cued to slow down and improve control w/ good carryover.  -Ipsilateral isometric hip abduction and concentric contralateral hip abduction  -Ipsilateral isometric knee extension and concentric contralateral hip abduction (knee bent)  Pt ambulated to hallway w/ RW and CGA for safety and performed lateral walking along handrail in hallway w/ BUE support on railing for 10ft and reported feeling light-headed/  woozy. Pt took seated rest break, BP taken 118/72 (86) 70bpm. Pt repeated 50ft lateral walking and was symptomatic again, sat down and BP 117/85 (95). Pt repeated 52ft lateral walking after symptoms improved and became symptomatic (dizzy, light headed) and remained in standing for BP of 74/57 (64) 90bpm w/ min A and BUE on handrail. Pt sat and TIS WC reclined to elevate legs and improve BP. BP taken after several minutes of rest 156/81 (104) 53bpm. Pt reported symptoms improved after rest. Pt returned to room and remained upright in TIS [facility] w/ call bell and all other needs within reach.   Therapy Documentation Precautions:  Precautions Precautions: Fall Recall of Precautions/Restrictions: Intact Precaution/Restrictions Comments: B rotator cuff tears Required Braces or Orthoses: Cervical Brace Cervical Brace: Soft collar, Other (comment) Other Brace: Can remove when in bed and when showering. May apply/remove when sitting Restrictions Weight Bearing Restrictions Per Provider Order: No   Therapy/Group: Individual Therapy  [name] 05/05/2024, 12:18 PM

## 2024-05-05 NOTE — Plan of Care (Signed)
  Problem: SCI BOWEL ELIMINATION Goal: RH STG MANAGE BOWEL WITH ASSISTANCE Description: STG Manage Bowel with supervision-min Assistance. Outcome: Progressing   Problem: SCI BLADDER ELIMINATION Goal: RH STG MANAGE BLADDER WITH ASSISTANCE Description: STG Manage Bladder With supervision-min Assistance Outcome: Progressing   Problem: RH SKIN INTEGRITY Goal: RH STG SKIN FREE OF INFECTION/BREAKDOWN Description: Manage skin free of infection with supervision - min assistance Outcome: Progressing   Problem: RH SAFETY Goal: RH STG ADHERE TO SAFETY PRECAUTIONS W/ASSISTANCE/DEVICE Description: STG Adhere to Safety Precautions With supervision- min Assistance/Device. Outcome: Progressing   Problem: RH PAIN MANAGEMENT Goal: RH STG PAIN MANAGED AT OR BELOW PT'S PAIN GOAL Description: <4 w/ prns Outcome: Progressing

## 2024-05-06 ENCOUNTER — Ambulatory Visit: Payer: Medicare HMO | Admitting: Hematology and Oncology

## 2024-05-06 LAB — GLUCOSE, CAPILLARY
Glucose-Capillary: 179 mg/dL — ABNORMAL HIGH (ref 70–99)
Glucose-Capillary: 118 mg/dL — ABNORMAL HIGH (ref 70–99)
Glucose-Capillary: 139 mg/dL — ABNORMAL HIGH (ref 70–99)
Glucose-Capillary: 203 mg/dL — ABNORMAL HIGH (ref 70–99)

## 2024-05-06 MED ORDER — SODIUM CHLORIDE 0.9% FLUSH
10.0000 mL | Freq: Two times a day (BID) | INTRAVENOUS | Status: DC
  Administered 2024-05-06 – 2024-05-11 (×16): 10 mL via INTRAVENOUS

## 2024-05-06 NOTE — Progress Notes (Signed)
 Physical Therapy Session Note  Patient Details  Name: Jerome Vaughn MRN: [id number] Date of Birth: 1951/04/08  Today's Date: 05/06/2024 PT Individual Time: 1315-1435 PT Individual Time Calculation (min): 80 min   Short Term Goals: Week 2:  PT Short Term Goal 1 (Week 2): Pt will be able to maintain seated upright in w/c >2 hours PT Short Term Goal 2 (Week 2): Pt will tolerate standing activity > 2 min PT Short Term Goal 3 (Week 2): Pt will initiate stair training  Skilled Therapeutic Interventions/Progress Updates: Pt presented in bed having just completed cathing agreeable to therapy. Pt c/o unrated and slightly increasing pain in B shoulders. NT present for vitals check as noted below. Performed ankle pumps and heel slides prior to transition to semi-fowler's position and pt was able to perform sit up to allow donning of ab binder. Pt indicated no s/s OH when in long sit therefore transitioned to EOB with CGA and BP as noted below. Pt stood with CGA and safely unlocked rollator. Pt ambulated to main gym with CGA and initially mild instability with RW which greatly improved with distance. In main gym pt participated in toe taps without AD to 4in step for dynamic balance challgene. Pt then attempted static balance with reaching task placing red clothespins on basketball net. Pt was able to place 6/8 and maintain standing for approx 1 min before s/s of OH in place. Pt was able to maintain responsiveness throughout episode with BP 88/64 (69) HR 78. Pt was able to complete stand step transfer with AD to standard chair for improved support with BP after transfer 99/67 (77) HR 69. Performed Sit to stand 2 x 5 from standard chair with minimal change in BP as noted below and no symptoms. Pt then began ambulating back to room and noted increased unsteadiness. Pt then with incontinent BM therefore ambulated back to room to bathroom. PTA providing total A for clothing management and pt able to empty bowels on  toilet. Once completed pt able to stand to allow PTA to complete peri-care and no noted increase in symptoms (pt stood for >1 min). PTA completed clothing management with mild increase in symptoms therefore pt sat in rollator and PTA transported pt to EOB. Pt completed stand step transfer without AD and CGA. Completed sit to supine with supervision and pt repositioned to comfort. Pt left in bed at end of session with bed alarm on, call bell within reach and needs met.    BP supine without ab binder: 154/84 HR 58 Semi Fowlers 51 degrees  without binder 119/78 (91) HR 66+ EOB with ab binder 0 min 111/76 (87) HR 69 BP after Sit to stand 94/69 (77) HR 73     Therapy Documentation Precautions:  Precautions Precautions: Fall Recall of Precautions/Restrictions: Intact Precaution/Restrictions Comments: B rotator cuff tears Required Braces or Orthoses: Cervical Brace Cervical Brace: Soft collar, Other (comment) Other Brace: Can remove when in bed and when showering. May apply/remove when sitting Restrictions Weight Bearing Restrictions Per Provider Order: No General:   Vital Signs: Therapy Vitals Temp: (!) 97.4 F (36.3 C) Pulse Rate: (!) 58 Resp: 19 BP: (!) 154/84 Patient Position (if appropriate): Lying Oxygen Therapy SpO2: 100 % O2 Device: Room Air  Therapy/Group: Individual Therapy  Jerome Vaughn 05/06/2024, 3:32 PM

## 2024-05-06 NOTE — Progress Notes (Signed)
 Physical Therapy Weekly Progress Note  Patient Details  Name: Jerome Vaughn MRN: [id number] Date of Birth: 02/03/1951  Beginning of progress report period: April 29, 2024 End of progress report period: May 06, 2024  Today's Date: 05/06/2024 PT Individual Time: 0900-1000 PT Individual Time Calculation (min): 60 min   Patient has met 3 of 3 short term goals.  [name] has demoed significant improvements in orthostatic hypotension, but continues to be limited by symptoms. Introduced rollator today, pt ambulated >150 ft with CGA and was able to demo safety while turning to sit on device.   Patient continues to demonstrate the following deficits muscle weakness, orthostatic hypotension, impaired timing and sequencing and decreased coordination, and decreased standing balance and decreased balance strategies and therefore will continue to benefit from skilled PT intervention to increase functional independence with mobility.  Patient progressing toward long term goals..  Continue plan of care.  PT Short Term Goals Week 2:  PT Short Term Goal 1 (Week 2): Pt will be able to maintain seated upright in w/c >2 hours PT Short Term Goal 1 - Progress (Week 2): Met PT Short Term Goal 2 (Week 2): Pt will tolerate standing activity > 2 min PT Short Term Goal 2 - Progress (Week 2): Met PT Short Term Goal 3 (Week 2): Pt will initiate stair training PT Short Term Goal 3 - Progress (Week 2): Met Week 3:  PT Short Term Goal 1 (Week 3): =LTGs d/t ELOS  Skilled Therapeutic Interventions/Progress Updates:    Pt recd in TIS, BP=157/90 (110), HR=63. No complaint of pain. Pt transported to therapy gym for time management and energy conservation. Session focused on gait for endurance and introducing Rollator. Pt first ambulated ~190 ft with RW for warm up and then was instructed on safety with rollator. Demoed good carryover throughout session with use of brakes and placing against solid surface to sit. Pt ambulated  same distance x 2 with practice parking and sitting on rollator with CGA. Pt reports feeling a little woozy, 88/66(74)HR=87. After several minutes with BLE elevated performing ankle pumps, recovered to 101/70 (81), hR=89. Noted L>R trendelenburg during gait, so pt directed in step ups with CL foot to second step for improved glute med activation and eccentric quad. Performed x 15 BIL, followed by 2 x 10 with 6lb weight in CL hand for increased glute med activation. Pt ambulated back to room and sat in recliner, was left with all needs in reach and alarm active.   Therapy Documentation Precautions:  Precautions Precautions: Fall Recall of Precautions/Restrictions: Intact Precaution/Restrictions Comments: B rotator cuff tears Required Braces or Orthoses: Cervical Brace Cervical Brace: Soft collar, Other (comment) Other Brace: Can remove when in bed and when showering. May apply/remove when sitting Restrictions Weight Bearing Restrictions Per Provider Order: No General:      Therapy/Group: Individual Therapy  [name] 05/06/2024, 12:15 PM

## 2024-05-06 NOTE — Progress Notes (Signed)
 Occupational Therapy Session Note  Patient Details  Name: Jerome Vaughn MRN: [id number] Date of Birth: 1951/05/11  Today's Date: 05/06/2024 OT Individual Time: 9265-9148 OT Individual Time Calculation (min): 77 min    Short Term Goals: Week 2:  OT Short Term Goal 1 (Week 2): Pt will have improved standing tolerance to be able to manage his clothing over his hips to prepare for toileting tasks. OT Short Term Goal 2 (Week 2): Pt will have improved standing tolerance to be able to tolerate standing long enough to pivot to a BSC with CGA or less. OT Short Term Goal 3 (Week 2): Pt will demonstrate improved B hand strength to use hands to pull pants over hips with mod A or less. OT Short Term Goal 4 (Week 2): Pt will be able to lift Left arm actively to be able to reach under it during bathing.  Skilled Therapeutic Interventions/Progress Updates:  Pt greeted supine in bed, pt agreeable to OT intervention but had not eaten breakfast yet.   Transfers/bed mobility/functional mobility:  Pt completed supine>sit with supervision.  Pt completed all sit>stands and stand pivot transfers with RW with CGA.    BP from TIS- 150/94( 111) HB 57 bpm  Pt completed 400 ft of functional mobility with RW and CGA, chair follow for safety BP after walking- 112/75( 87) HR 65    ADLs:  Grooming: pt completed oral care seated in w/c with set- up assist  UB dressing: donned ABD binder with MAX A from EOB Footwear: donned shoes/teds/ace wraps with total A  Transfers: pt completed stand pivot transfer to [facility] with RW Toileting: pt with continent bowel void needing MAX A for 3/3 toileting tasks.  Self feeding: pt required set- up assist for self feeding with pt using adapted utensils for food manipulation and hand to mouth pattern.                  Ended session with pt seated  in TIS with all needs within reach and nurse present.   Therapy Documentation Precautions:  Precautions Precautions: Fall Recall of  Precautions/Restrictions: Intact Precaution/Restrictions Comments: B rotator cuff tears Required Braces or Orthoses: Cervical Brace Cervical Brace: Soft collar, Other (comment) Other Brace: Can remove when in bed and when showering. May apply/remove when sitting Restrictions Weight Bearing Restrictions Per Provider Order: No  Pain: no pain     Therapy/Group: Individual Therapy  Ronal Gift [facility] 05/06/2024, 12:02 PM

## 2024-05-06 NOTE — Progress Notes (Signed)
 Educated on low dexterity catheter with representative from the [facility] cath together with OT. Will continue to work on low dexterity catheters.

## 2024-05-06 NOTE — Progress Notes (Shared)
 Physical Therapy Session Note  Patient Details  Name: Jerome Vaughn MRN: [id number] Date of Birth: [birth year]  {CHL IP REHAB PT TIME CALCULATION:304800500}  Short Term Goals: {DUH:6958314}  Skilled Therapeutic Interventions/Progress Updates:     157/90 (110) 63bpm  Therapy Documentation Precautions:  Precautions Precautions: Fall Recall of Precautions/Restrictions: Intact Precaution/Restrictions Comments: B rotator cuff tears Required Braces or Orthoses: Cervical Brace Cervical Brace: Soft collar, Other (comment) Other Brace: Can remove when in bed and when showering. May apply/remove when sitting Restrictions Weight Bearing Restrictions Per Provider Order: No    Therapy/Group: Individual Therapy  [name] 05/06/2024, 9:10 AM
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
## 2024-05-06 NOTE — Plan of Care (Signed)
  Problem: Consults Goal: RH SPINAL CORD INJURY PATIENT EDUCATION Description:  See Patient Education module for education specifics.  Outcome: Progressing   Problem: SCI BOWEL ELIMINATION Goal: RH STG MANAGE BOWEL WITH ASSISTANCE Description: STG Manage Bowel with supervision-min Assistance. Outcome: Progressing   Problem: SCI BLADDER ELIMINATION Goal: RH STG MANAGE BLADDER WITH ASSISTANCE Description: STG Manage Bladder With supervision-min Assistance Outcome: Progressing   Problem: RH SKIN INTEGRITY Goal: RH STG SKIN FREE OF INFECTION/BREAKDOWN Description: Manage skin free of infection with supervision - min assistance Outcome: Progressing   Problem: RH SAFETY Goal: RH STG ADHERE TO SAFETY PRECAUTIONS W/ASSISTANCE/DEVICE Description: STG Adhere to Safety Precautions With supervision- min Assistance/Device. Outcome: Progressing   Problem: RH PAIN MANAGEMENT Goal: RH STG PAIN MANAGED AT OR BELOW PT'S PAIN GOAL Description: <4 w/ prns Outcome: Progressing

## 2024-05-07 LAB — GLUCOSE, CAPILLARY
Glucose-Capillary: 132 mg/dL — ABNORMAL HIGH (ref 70–99)
Glucose-Capillary: 226 mg/dL — ABNORMAL HIGH (ref 70–99)
Glucose-Capillary: 179 mg/dL — ABNORMAL HIGH (ref 70–99)
Glucose-Capillary: 173 mg/dL — ABNORMAL HIGH (ref 70–99)
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
## 2024-05-08 LAB — GLUCOSE, CAPILLARY
Glucose-Capillary: 180 mg/dL — ABNORMAL HIGH (ref 70–99)
Glucose-Capillary: 156 mg/dL — ABNORMAL HIGH (ref 70–99)
Glucose-Capillary: 137 mg/dL — ABNORMAL HIGH (ref 70–99)
Glucose-Capillary: 88 mg/dL (ref 70–99)
Glucose-Capillary: 230 mg/dL — ABNORMAL HIGH (ref 70–99)
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
## 2024-05-08 NOTE — Progress Notes (Signed)
 Physical Therapy Session Note  Patient Details  Name: Jerome Vaughn MRN: [id number] Date of Birth: [birth year]  Today's Date: 05/08/2024 PT Individual Time: 1456-1600 PT Individual Time Calculation (min): 64 min   Short Term Goals: Week 3:  PT Short Term Goal 1 (Week 3): =LTGs d/t ELOS  Skilled Therapeutic Interventions/Progress Updates: Pt presented in bed agreeable to therapy. Pt denies pain during session. Session focused on family education. Family had not yet arrived therefore discussed use of prophylactics and donning prior to OOB. PTA donned TED hose and ace bandages on one leg. Wife arrived while completing leg therefore PTA provided education with wife donning TED hose and ace bandages on second leg. Discussed the importance of moving slowly to allow for BP regulation and ultimately increasing upright time. Pt completed semi-fowlers to sit EOB without s/s of OH. Pt completed seated LAQ, ankle pumps, and marches prior to standing. Pt stood with supervision and ambulated to main gym with rollator and CGA. PTA providing education pt should not need physical assist for mobility (advised may be different for ADLs). Pt stood at stairs in gym with pt then indicating need to sit. Pt sat in rollator and noted mild glaze on eyes but pt remained responsive. PTA noted that pt's abdominal binder was not on therefore obtained binder from room and donned. PTA also placed legs on seat for elevation. After several minutes pt stating feeling better with PTA recommending transporting pt back via rollator. Upon arrival in room just prior to pt transporting back to bed pt becoming rigid and minimally responsive. Staff able to fireman carry pt to bed with pt's symptoms resolving. Pt was able to reposition to comfort and completed education bed level that pt would benefit from increasing OOB tolerance to allow for increased regulation. Pt left in bed with family present and current queries answered with call bell within  reach and needs met.      Therapy Documentation Precautions:  Precautions Precautions: Fall Recall of Precautions/Restrictions: Intact Precaution/Restrictions Comments: B rotator cuff tears Required Braces or Orthoses: Cervical Brace Cervical Brace: Soft collar, Other (comment) Other Brace: Can remove when in bed and when showering. May apply/remove when sitting Restrictions Weight Bearing Restrictions Per Provider Order: No General:   Vital Signs: Therapy Vitals Temp: (!) 97.5 F (36.4 C) Temp Source: Oral Pulse Rate: 67 Resp: 17 BP: 110/73 Patient Position (if appropriate): Sitting Oxygen Therapy SpO2: 98 % O2 Device: Room Air Pain: Pain Assessment Pain Scale: 0-10 Pain Score: 4  Pain Location: Shoulder Pain Intervention(s): Medication (See eMAR) Mobility:   Locomotion :    Trunk/Postural Assessment :    Balance:   Exercises:   Other Treatments:      Therapy/Group: Individual Therapy  Diamond Martucci 05/08/2024, 4:32 PM

## 2024-05-09 LAB — COMPREHENSIVE METABOLIC PANEL WITH GFR
ALT: 87 U/L — ABNORMAL HIGH (ref 0–44)
AST: 45 U/L — ABNORMAL HIGH (ref 15–41)
Albumin: 3.1 g/dL — ABNORMAL LOW (ref 3.5–5.0)
Alkaline Phosphatase: 41 U/L (ref 38–126)
Anion gap: 10 (ref 5–15)
BUN: 15 mg/dL (ref 8–23)
CO2: 24 mmol/L (ref 22–32)
Calcium: 9.7 mg/dL (ref 8.9–10.3)
Chloride: 103 mmol/L (ref 98–111)
Creatinine, Ser: 1.02 mg/dL (ref 0.61–1.24)
GFR, Estimated: >60 mL/min (ref >60)
Glucose, Bld: 117 mg/dL — ABNORMAL HIGH (ref 70–99)
Potassium: 4.3 mmol/L (ref 3.5–5.1)
Sodium: 137 mmol/L (ref 135–145)
Total Bilirubin: 0.3 mg/dL (ref 0.0–1.2)
Total Protein: 7.4 g/dL (ref 6.5–8.1)

## 2024-05-09 LAB — CBC WITH DIFFERENTIAL/PLATELET
Abs Immature Granulocytes: 0.01 K/uL (ref 0.00–0.07)
Basophils Absolute: 0 K/uL (ref 0.0–0.1)
Basophils Relative: 1 %
Eosinophils Absolute: 0.1 K/uL (ref 0.0–0.5)
Eosinophils Relative: 1 %
HCT: 37.8 % — ABNORMAL LOW (ref 39.0–52.0)
Hemoglobin: 12.8 g/dL — ABNORMAL LOW (ref 13.0–17.0)
Immature Granulocytes: 0 %
Lymphocytes Relative: 33 %
Lymphs Abs: 1.7 K/uL (ref 0.7–4.0)
MCH: 29.2 pg (ref 26.0–34.0)
MCHC: 33.9 g/dL (ref 30.0–36.0)
MCV: 86.1 fL (ref 80.0–100.0)
Monocytes Absolute: 0.5 K/uL (ref 0.1–1.0)
Monocytes Relative: 9 %
Neutro Abs: 2.9 K/uL (ref 1.7–7.7)
Neutrophils Relative %: 56 %
Platelets: 196 K/uL (ref 150–400)
RBC: 4.39 MIL/uL (ref 4.22–5.81)
RDW: 12.4 % (ref 11.5–15.5)
WBC: 5.2 K/uL (ref 4.0–10.5)
nRBC: 0 % (ref 0.0–0.2)

## 2024-05-09 LAB — GLUCOSE, CAPILLARY
Glucose-Capillary: 197 mg/dL — ABNORMAL HIGH (ref 70–99)
Glucose-Capillary: 226 mg/dL — ABNORMAL HIGH (ref 70–99)
Glucose-Capillary: 163 mg/dL — ABNORMAL HIGH (ref 70–99)
Glucose-Capillary: 193 mg/dL — ABNORMAL HIGH (ref 70–99)

## 2024-05-09 MED ORDER — ATOMOXETINE HCL 40 MG PO CAPS
40.0000 mg | ORAL_CAPSULE | Freq: Every day | ORAL | Status: DC
  Administered 2024-05-10 – 2024-05-12 (×5): 40 mg via ORAL
  Filled 2024-05-09 (×4): qty 1

## 2024-05-09 MED ORDER — ACETAMINOPHEN 325 MG PO TABS
325.0000 mg | ORAL_TABLET | Freq: Three times a day (TID) | ORAL | Status: DC
  Administered 2024-05-09 – 2024-05-17 (×40): 325 mg via ORAL
  Filled 2024-05-09 (×31): qty 1

## 2024-05-09 MED ORDER — ATOMOXETINE HCL 10 MG PO CAPS
20.0000 mg | ORAL_CAPSULE | Freq: Once | ORAL | Status: AC
  Administered 2024-05-09 (×2): 20 mg via ORAL
  Filled 2024-05-09: qty 2

## 2024-05-09 MED ORDER — METFORMIN HCL 850 MG PO TABS
850.0000 mg | ORAL_TABLET | Freq: Two times a day (BID) | ORAL | Status: DC
  Administered 2024-05-10 – 2024-05-17 (×19): 850 mg via ORAL
  Filled 2024-05-09 (×15): qty 1
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
## 2024-05-09 NOTE — Plan of Care (Signed)
  Problem: Consults Goal: RH SPINAL CORD INJURY PATIENT EDUCATION Description:  See Patient Education module for education specifics.  Outcome: Progressing   Problem: SCI BOWEL ELIMINATION Goal: RH STG MANAGE BOWEL WITH ASSISTANCE Description: STG Manage Bowel with supervision-min Assistance. Outcome: Progressing   Problem: SCI BLADDER ELIMINATION Goal: RH STG MANAGE BLADDER WITH ASSISTANCE Description: STG Manage Bladder With supervision-min Assistance Outcome: Progressing   Problem: RH SKIN INTEGRITY Goal: RH STG SKIN FREE OF INFECTION/BREAKDOWN Description: Manage skin free of infection with supervision - min assistance Outcome: Progressing   Problem: RH SAFETY Goal: RH STG ADHERE TO SAFETY PRECAUTIONS W/ASSISTANCE/DEVICE Description: STG Adhere to Safety Precautions With supervision- min Assistance/Device. Outcome: Progressing   Problem: RH PAIN MANAGEMENT Goal: RH STG PAIN MANAGED AT OR BELOW PT'S PAIN GOAL Description: <4 w/ prns Outcome: Progressing   Problem: RH KNOWLEDGE DEFICIT SCI Goal: RH STG INCREASE KNOWLEDGE OF SELF CARE AFTER SCI Description: Manage increase knowledge of self care after SCI with supervision- min assistance from wife using educational materials provided Outcome: Progressing   Problem: Education: Goal: Ability to describe self-care measures that may prevent or decrease complications (Diabetes Financial trader) will improve Outcome: Progressing Goal: Individualized Educational Video(s) Outcome: Progressing   Problem: Coping: Goal: Ability to adjust to condition or change in health will improve Outcome: Progressing   Problem: Fluid Volume: Goal: Ability to maintain a balanced intake and output will improve Outcome: Progressing   Problem: Health Behavior/Discharge Planning: Goal: Ability to identify and utilize available resources and services will improve Outcome: Progressing Goal: Ability to manage health-related needs will  improve Outcome: Progressing   Problem: Metabolic: Goal: Ability to maintain appropriate glucose levels will improve Outcome: Progressing   Problem: Nutritional: Goal: Maintenance of adequate nutrition will improve Outcome: Progressing Goal: Progress toward achieving an optimal weight will improve Outcome: Progressing   Problem: Skin Integrity: Goal: Risk for impaired skin integrity will decrease Outcome: Progressing   Problem: Tissue Perfusion: Goal: Adequacy of tissue perfusion will improve Outcome: Progressing

## 2024-05-09 NOTE — Progress Notes (Signed)
 Patient ID: Jerome Vaughn, male   DOB: 10-Feb-1951, 73 y.o.   MRN: [id number]  SW faxed [employer]- Claims Dept [phone].  Graeme Jude, MSW, LCSW Office: [phone] Cell: [phone] Fax: [phone]

## 2024-05-09 NOTE — Plan of Care (Signed)
  Problem: Consults Goal: RH SPINAL CORD INJURY PATIENT EDUCATION Description:  See Patient Education module for education specifics.  Outcome: Progressing

## 2024-05-09 NOTE — Progress Notes (Signed)
 Occupational Therapy Session Note  Patient Details  Name: Jerome Vaughn MRN: [id number] Date of Birth: 13-Feb-1951  Today's Date: 05/09/2024 OT Individual Time: 1400-1430 OT Individual Time Calculation (min): 30 min    Short Term Goals: Week 2:  OT Short Term Goal 1 (Week 2): Pt will have improved standing tolerance to be able to manage his clothing over his hips to prepare for toileting tasks. OT Short Term Goal 2 (Week 2): Pt will have improved standing tolerance to be able to tolerate standing long enough to pivot to a BSC with CGA or less. OT Short Term Goal 3 (Week 2): Pt will demonstrate improved B hand strength to use hands to pull pants over hips with mod A or less. OT Short Term Goal 4 (Week 2): Pt will be able to lift Left arm actively to be able to reach under it during bathing.  Skilled Therapeutic Interventions/Progress Updates:    Pt received supine with no c/o pain, agreeable to OT session.  Pt with abdominal binder, teds, and ace wraps on. Reports his BP has been high today. He used his rollator to complete functional mobility to the therapy gym, about 150 ft, with CGA. He sat down on the rollator and OT assessed BP- 90/60. Pt only mildly symptomatic but OT insisted transfer to mat. As he stood again and approached the mat he became much more unsteady and required mod A to finish transfer. Quickly got pt into supine with legs elevated, anticipate BP got much lower but could not test in standing 2/2 pt safety. He took a supine rest and BP was 123/77. He came to sitting and BP was 102/68. He completed bimanual intrinsic hand muscle strengthening/coordination task with playing cards. He required min facilitation for thumb adduction/extension. He returned to his room following. Pt was left sitting up in the wheelchair with all needs met, chair alarm set, and call bell within reach.    Therapy Documentation Precautions:  Precautions Precautions: Fall Recall of Precautions/Restrictions:  Intact Precaution/Restrictions Comments: B rotator cuff tears Required Braces or Orthoses: Cervical Brace Cervical Brace: Soft collar, Other (comment) Other Brace: Can remove when in bed and when showering. May apply/remove when sitting Restrictions Weight Bearing Restrictions Per Provider Order: No  Therapy/Group: Individual Therapy  Nena VEAR Moats 05/09/2024, 6:22 AM

## 2024-05-09 NOTE — Progress Notes (Addendum)
 IP Rehab Bowel Program Documentation   Bowel Program not done per order as patient had BM today.

## 2024-05-09 NOTE — Progress Notes (Signed)
 Occupational Therapy Session Note  Patient Details  Name: Jerome Vaughn MRN: [id number] Date of Birth: 06-03-1951  Today's Date: 05/09/2024 OT Individual Time: 1500-1530 OT Individual Time Calculation (min): 30 min    Short Term Goals: Week 2:  OT Short Term Goal 1 (Week 2): Pt will have improved standing tolerance to be able to manage his clothing over his hips to prepare for toileting tasks. OT Short Term Goal 2 (Week 2): Pt will have improved standing tolerance to be able to tolerate standing long enough to pivot to a BSC with CGA or less. OT Short Term Goal 3 (Week 2): Pt will demonstrate improved B hand strength to use hands to pull pants over hips with mod A or less. OT Short Term Goal 4 (Week 2): Pt will be able to lift Left arm actively to be able to reach under it during bathing.  Skilled Therapeutic Interventions/Progress Updates:      Therapy Documentation Precautions:  Precautions Precautions: Fall Recall of Precautions/Restrictions: Intact Precaution/Restrictions Comments: B rotator cuff tears Required Braces or Orthoses: Cervical Brace Cervical Brace: Soft collar, Other (comment) Other Brace: Can remove when in bed and when showering. May apply/remove when sitting Restrictions Weight Bearing Restrictions Per Provider Order: No General:  Pt seated in W/C upon OT arrival, agreeable to OT. Wife present.  Other Treatments: OT had discussion with pt and wife about mobility within bathroom at home d/t small doorway. OT problem solving several options for safety, especially during the night if pt had to use bathroom d/t OH. Pt and wife showing OT pictures of bathroom set up for clarification. OT recommended for safety, having wife be in bathroom with pt d/t OH issues when D/C home for safety.    Pt seated in W/C at end of session with W/C alarm donned, call light within reach and 4Ps assessed. Wife present   Therapy/Group: Individual Therapy  Camie Hoe, OTD,  OTR/L 05/09/2024, 4:48 PM

## 2024-05-09 NOTE — Progress Notes (Signed)
 Physical Therapy Session Note  Patient Details  Name: Jerome Vaughn MRN: [id number] Date of Birth: 1951-09-03  Today's Date: 05/09/2024 PT Individual Time: 9199-9154, 1000-1115 PT Individual Time Calculation (min): 45 min, 75 min   Short Term Goals: Week 3:  PT Short Term Goal 1 (Week 3): =LTGs d/t ELOS  Skilled Therapeutic Interventions/Progress Updates:   Session 1:  Pt received supine and agreeable to therapy. Pt denied any pain at this time. Ted hose and ace wraps donned total assist for time. Supervision for bed mobility. Abd binder donned in sitting. Pt given ample rest between movements to reduce risk of OH and allow pt to accommodate to each position. Pt performed STS w/ CGA and rollator, cued to push off bed instead of pull on rollator handles w/ good carryover. Pt requested BP in standing, asymptomatic: 100/64 (75) 87bpm. Pt ambulated 28ft w/ rollator and CGA, demonstrating reciprocal gait pattern and efficient L hand turn after self-reported difficulty w/ L turns. No s/s of dizziness or nausea during gait. Pt given CGA for safety. Pt returned to room and remained upright in TIS [facility] w/ all needs in reach including call bell.    Session 2: Pt received upright in TIS WC agreeable to therapy. Pt reported no pain at beginning of session.  Transfer:  STS: CGA to minA w/ rollator for safety  Stand pivot: CGA to minA w/ rollator due to pt reported fatigue. Performed to conserve energy/time instead of ambulating to main gym.  Seated/standing to floor: Pt educated on importance of recognizing onset of symptoms and steps to take to prevent a fall. Trialed seated to half kneel unsuccessful due to limited B knee flexion ROM/B knee pain. Trialed stand to half kneel w/ rollator to control descent w/ BUE, painful but able to half kneel and quickly roll to side and onto back. Pt required maxA to get down to sidelying safely, managing trunk to decrease force on RUE. Reported R shoulder pain w/ this  transfer. 4/10 addressed w/ physical activity. Pt rolled into prone and pain decreased in B shoulders while WB through elbows/forearms.  Floor to mat: Pt educated to call for help and activate EMS if flat on ground, as pt unable to achieve/maintain quadruped w/o maxA to lift trunk for safety. Pt given maxA for trunk management to push up on mat and come to half kneel to progress to sitting edge of mat for safety and completion of transfer. Pt given prolonged rest break after transfer. Pt symptomatic but vitals WNL, reported below.   Therapeutic Activity: Pt educated on movement to address symptoms, ambulated 40ft in main gym w/ minA and rollator for safety. Pt reported no change in symptoms, pt returned to [facility] and wheeled back to room. Pt left with all needs within reach including call bell.   Vitals: Seated edge of mat, 129/82 (96) 73bpm, symptomatic  Seated TIS WC legs down & abd binder doffed per pt request, 127/82 (94) 61bpm, symptomatic  Therapy Documentation Precautions:  Precautions Precautions: Fall Recall of Precautions/Restrictions: Intact Precaution/Restrictions Comments: B rotator cuff tears Required Braces or Orthoses: Cervical Brace Cervical Brace: Soft collar, Other (comment) Other Brace: Can remove when in bed and when showering. May apply/remove when sitting Restrictions Weight Bearing Restrictions Per Provider Order: No   Therapy/Group: Individual Therapy  [name] 05/09/2024, 4:02 PM

## 2024-05-10 LAB — GLUCOSE, CAPILLARY
Glucose-Capillary: 134 mg/dL — ABNORMAL HIGH (ref 70–99)
Glucose-Capillary: 141 mg/dL — ABNORMAL HIGH (ref 70–99)
Glucose-Capillary: 192 mg/dL — ABNORMAL HIGH (ref 70–99)
Glucose-Capillary: 163 mg/dL — ABNORMAL HIGH (ref 70–99)

## 2024-05-10 NOTE — Progress Notes (Signed)
 Patient refused to wear wrist splints as ordered. Patient educated on risk/benefits. Patient verbalized understanding.

## 2024-05-10 NOTE — Progress Notes (Signed)
 Orthopedic Tech Progress Note Patient Details:  Jerome Vaughn 09/29/1951 [id number]  Ortho Devices Type of Ortho Device: Soft collar Ortho Device/Splint Location: Neck Ortho Device/Splint Interventions: Application, Ordered   Post Interventions Patient Tolerated: Well  Jerome Vaughn 05/10/2024, 3:58 PM

## 2024-05-10 NOTE — Plan of Care (Signed)
  Problem: Consults Goal: RH SPINAL CORD INJURY PATIENT EDUCATION Description:  See Patient Education module for education specifics.  Outcome: Progressing   Problem: SCI BOWEL ELIMINATION Goal: RH STG MANAGE BOWEL WITH ASSISTANCE Description: STG Manage Bowel with supervision-min Assistance. Outcome: Progressing   Problem: SCI BLADDER ELIMINATION Goal: RH STG MANAGE BLADDER WITH ASSISTANCE Description: STG Manage Bladder With supervision-min Assistance Outcome: Progressing   Problem: RH SKIN INTEGRITY Goal: RH STG SKIN FREE OF INFECTION/BREAKDOWN Description: Manage skin free of infection with supervision - min assistance Outcome: Progressing   Problem: RH SAFETY Goal: RH STG ADHERE TO SAFETY PRECAUTIONS W/ASSISTANCE/DEVICE Description: STG Adhere to Safety Precautions With supervision- min Assistance/Device. Outcome: Progressing   Problem: RH PAIN MANAGEMENT Goal: RH STG PAIN MANAGED AT OR BELOW PT'S PAIN GOAL Description: <4 w/ prns Outcome: Progressing   Problem: RH KNOWLEDGE DEFICIT SCI Goal: RH STG INCREASE KNOWLEDGE OF SELF CARE AFTER SCI Description: Manage increase knowledge of self care after SCI with supervision- min assistance from wife using educational materials provided Outcome: Progressing   Problem: Education: Goal: Ability to describe self-care measures that may prevent or decrease complications (Diabetes Financial trader) will improve Outcome: Progressing Goal: Individualized Educational Video(s) Outcome: Progressing   Problem: Coping: Goal: Ability to adjust to condition or change in health will improve Outcome: Progressing   Problem: Fluid Volume: Goal: Ability to maintain a balanced intake and output will improve Outcome: Progressing   Problem: Health Behavior/Discharge Planning: Goal: Ability to identify and utilize available resources and services will improve Outcome: Progressing Goal: Ability to manage health-related needs will  improve Outcome: Progressing   Problem: Metabolic: Goal: Ability to maintain appropriate glucose levels will improve Outcome: Progressing   Problem: Nutritional: Goal: Maintenance of adequate nutrition will improve Outcome: Progressing Goal: Progress toward achieving an optimal weight will improve Outcome: Progressing   Problem: Skin Integrity: Goal: Risk for impaired skin integrity will decrease Outcome: Progressing   Problem: Tissue Perfusion: Goal: Adequacy of tissue perfusion will improve Outcome: Progressing

## 2024-05-10 NOTE — Progress Notes (Signed)
 IP Rehab Bowel Program Documentation   Bowel Program not initiated per order as patient had BM yesterday.

## 2024-05-10 NOTE — Patient Care Conference (Signed)
 Inpatient RehabilitationTeam Conference and Plan of Care Update Date: 05/10/2024   Time: 1125 am    Patient Name: Jerome Vaughn      Medical Record Number: [id number]  Date of Birth: March 24, 1951 Sex: Male         Room/Bed: 4W09C/4W09C-01 Payor Info: Payor: [employer] MEDICARE / Plan: [employer] MEDICARE HMO / Product Type: *No Product type* /    Admit Date/Time:  04/21/2024  6:31 PM  Primary Diagnosis:  Acute incomplete quadriplegia [facility])  Hospital Problems: Principal Problem:   Acute incomplete quadriplegia (HCC) Active Problems:   Coping style affecting medical condition    Expected Discharge Date: Expected Discharge Date: 05/17/24  Team Members Present: Physician leading conference: Dr. Megan Lovorn Social Worker Present: Graeme Jude, LCSW Nurse Present: Eulalio Falls, RN PT Present: Schuyler Batter, PT OT Present: Delon Sharps, OT     Current Status/Progress Goal Weekly Team Focus  Bowel/Bladder   Continent of bowel and bladder; Bowel program if no bowel movement in 2 days. In and out cath if PVR is > 350 ml. LBM 05/09/24   Remain continent of bowel and bladder.   Assess bowel and bladder needs q 2 hours while awake and PRN.    Swallow/Nutrition/ Hydration               ADL's   Mod UB ADLs, Max LB ADLs d/t limited hand/shoulder function, wife completing family training 8/12 with good carryover and safe techniques   Min A overall with self care, supervision with toilet transfers   ADL retraining, BP management, d/c planning, continued family ed    Mobility   supervision for bed mobility, CGA for STS and stand-step transfer. pt limited by orthostatic hypotension and decreased upright tolerance.   supervision ambulatory level, min A stairs  upright tolerance, safety with rollator, discharge planning    Communication                Safety/Cognition/ Behavioral Observations               Pain   Patient currently denies pain.   Patient rates pain less than  or equal to 3/10.   Assess pain q shift and PRN, medicate if needed.    Skin   Surgical incision to anterior neck.   To remain free from skin breakdown and infection.  Assess skin q shift and PRN.      Discharge Planning:  Pt will d/c to home with his wife who will provide 24/7 care. Pt needs ot be as independent as possible since wife is not able to provide any physical assistance (i.e lifting). Fam conference held last Thursday to review care needs. Fam edu on tuesday 1pm-4pm. SW will confirm there are no barriers to discharge.    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 Patient s/p discectomy for decompression of spinal cord. Patient with hypotension : medication adjusted by MD. Patient limited by pain, spasticity and decrease upright tolerance and  weak upper extremities.  Patient on target to meet rehab goals: yes, currently requires mod assist with upper body care and max assistance with lower body care. Patient needs  supervision with toilet transfers. Patient requires CGA for STS and stand step transfers. Overall goal are set for supervision assistance.   *See Care Plan and progress notes for long and short-term goals.   Revisions to Treatment Plan:  EVA walker I and O caths Bowel program TEDS/Abdominal binders Family Conference  Teaching Needs: Safety, transfers, toileting, self catheterization, medications, etc   Current Barriers to Discharge: Decreased caregiver support, Home enviroment access/layout, and Neurogenic bowel and bladder  Possible Resolutions to Barriers: Family Education     Medical Summary Current Status: not needing cathing, having BM's- severe hypotension  Barriers to Discharge: Medical stability;Neurogenic Bowel & Bladder;Hypotension;Self-care education;Spasticity;Weight bearing restrictions  Barriers to Discharge Comments: limited hand function- family ed today;  spasticity is still an issue, orthostaitc hypotension is still bad Possible Resolutions to [employer] Focus: increased starattera for [facility]; but will also NOT holds midodrine - 8/19   Continued Need for Acute Rehabilitation Level of Care: The patient requires daily medical management by a physician with specialized training in physical medicine and rehabilitation for the following reasons: Direction of a multidisciplinary physical rehabilitation program to maximize functional independence : Yes Medical management of patient stability for increased activity during participation in an intensive rehabilitation regime.: Yes Analysis of laboratory values and/or radiology reports with any subsequent need for medication adjustment and/or medical intervention. : Yes   I attest that I was present, lead the team conference, and concur with the assessment and plan of the team.   Corley Maffeo Gayo 05/10/2024, 1125 am

## 2024-05-10 NOTE — Progress Notes (Signed)
 Occupational Therapy Session Note  Patient Details  Name: Jerome Vaughn MRN: [id number] Date of Birth: Jun 12, 1951  Today's Date: 05/10/2024 OT Individual Time: 1300-1410 OT Individual Time Calculation (min): 70 min    Short Term Goals: Week 1:  OT Short Term Goal 1 (Week 1): Pt will consistently direct care with family and staff regarding use/need of AE and orthotics for self feeding and/or daily tasks. OT Short Term Goal 1 - Progress (Week 1): Met OT Short Term Goal 2 (Week 1): Pt will completed UB dressing with Max A and use of compensatory techniques. OT Short Term Goal 2 - Progress (Week 1): Met OT Short Term Goal 3 (Week 1): Pt will completed simple grooming task such as brushing teeth with Max A and use of adaptive equipment OT Short Term Goal 3 - Progress (Week 1): Met  Skilled Therapeutic Interventions/Progress Updates:      Therapy Documentation Precautions:  Precautions Precautions: Fall Recall of Precautions/Restrictions: Intact Precaution/Restrictions Comments: B rotator cuff tears Required Braces or Orthoses: Cervical Brace Cervical Brace: Soft collar, Other (comment) Other Brace: Can remove when in bed and when showering. May apply/remove when sitting Restrictions Weight Bearing Restrictions Per Provider Order: No General: family ed  Pt seated in recliner upon OT arrival, agreeable to OT session. Wife present for family ed  Pain: unrated pain reported in neck, activity, intermittent rest breaks, distractions provided for pain management, pt reports tolerable to proceed.   ADL: OT providing skilled intervention on ADL retraining in order to increase independence with tasks and increase activity tolerance. Pt wife assisting with full ADL. OT recommended bathing with TEDs and ACE d/t ongoing BP issues. Pt with no reported dizziness throughout bathing. OT educating limiting positional changes during bathing to assist with managing BP. Pt wife assisting with washing and  rinsing pt. OT educating pt on lateral leaning method. After bathing, wife assisting with clothing management and OT assisting with re-applying dry ACE and TEDs. Pt and wife grateful for shower. OT also messaging MD for new soft collar to swap out current one for hygiene.    Pt seated in W/C at end of session with call light within reach and 4Ps assessed. Wife present.   Therapy/Group: Individual Therapy  Camie Hoe, OTD, OTR/L 05/10/2024, 4:15 PM

## 2024-05-10 NOTE — Progress Notes (Signed)
 Patient ID: Jerome Vaughn, male   DOB: 1951/01/09, 73 y.o.   MRN: [id number]  SW met with pt and pt wife in room to provide updates from team conference on gains made, and d/c remains 8/19. Wife here for family edu today. SW will follow-up with updates to confirm d/c recs.   Graeme Jude, MSW, LCSW Office: [phone] Cell: [phone] Fax: [phone]

## 2024-05-10 NOTE — Progress Notes (Signed)
 Physical Therapy Session Note  Patient Details  Name: Jerome Vaughn MRN: [id number] Date of Birth: 1951-05-19  Today's Date: 05/10/2024 PT Individual Time: 0800-0915 PT Individual Time Calculation (min): 75 min   Short Term Goals: Week 3:  PT Short Term Goal 1 (Week 3): =LTGs d/t ELOS  Skilled Therapeutic Interventions/Progress Updates:   Pt received semi-fowlers in bed, symptomatic feeling woozy. BP 125/90 (102) 67bpm. Ted hose, ace wraps donned w/ maxA as pt elevated BLE to assist. Abd binder donned w/ total A. No s/s seated EOB, pt ambulated 142ft w/ rollator to improve upright tolerance, endurance, and functional mobility. Pt required CGA progressing to SBA. Although asymptomatic, pt requested BP: 109/70 (87) 83bpm. Pt encouraged to note onset of symptoms as indicator to sit/lie down.   Interventions:  Prone: B AROM hamstring curls, green TB resisted x5 B. Pt noted to have decreased coordination w/ resisted ham curls, demonstrating uncontrolled oscillations at hips w/ activation of antagonistic and synergistic muscles alike.  B rectus femoris stretching B hip extension w/ knee bent B hip extension w/ knee straight demonstrated improved coordination, pt reported to be easier than w/ knee bent.  Attempted self-dosed stretching of B hip flexors w/ gait belt over shoulder w/ limited success due to poor grip strength. Pt requires PA pressure on posterior ipsilateral hip to target rectus femoris. At end of session, pt ambulated 178ft back to room w/ rollator and CGA to SBA for safety and remained upright in [facility] w/ all needs in place including call bell and chair alarm on.   Therapy Documentation Precautions:  Precautions Precautions: Fall Recall of Precautions/Restrictions: Intact Precaution/Restrictions Comments: B rotator cuff tears Required Braces or Orthoses: Cervical Brace Cervical Brace: Soft collar, Other (comment) Other Brace: Can remove when in bed and when showering. May  apply/remove when sitting Restrictions Weight Bearing Restrictions Per Provider Order: No   Therapy/Group: Individual Therapy  [name] 05/10/2024, 1:10 PM

## 2024-05-10 NOTE — Progress Notes (Signed)
 Physical Therapy Session Note  Patient Details  Name: Jerome Vaughn MRN: [id number] Date of Birth: 08/23/1951  Today's Date: 05/10/2024 PT Individual Time: 1430-1530 PT Individual Time Calculation (min): 60 min   Short Term Goals: Week 3:  PT Short Term Goal 1 (Week 3): =LTGs d/t ELOS  Skilled Therapeutic Interventions/Progress Updates:    Pt received in recliner and agreeable to therapy.  No complaint of pain. Session focused on family education with pt's wife, Jerome Vaughn. Topics covered included car transfer, supervision level assist, providing CGA if needed, stair navigation, uneven surfaces, bed mobility, safety with community mobility. Pt performed Sit to stand and gait up >150 ft with rollator throughout session with CGA to close supervision.  Pt performed car transfer with supervision. Stair navigation x 12 on 6 steps with BIL hand rails. Pt wife educated on and demoed providing CGA assist for safety. Pt also performed furniture transfers from low sofa and recliner in ADL apartment, as well as bed mobility on normal bed in apartment with supervision. Discussed safety with rollator and preventing/responding to hypotensive episodes. Pt returned to room, reports no further questions and remained in recliner, was left with all needs in reach and alarm active.   Therapy Documentation Precautions:  Precautions Precautions: Fall Recall of Precautions/Restrictions: Intact Precaution/Restrictions Comments: B rotator cuff tears Required Braces or Orthoses: Cervical Brace Cervical Brace: Soft collar, Other (comment) Other Brace: Can remove when in bed and when showering. May apply/remove when sitting Restrictions Weight Bearing Restrictions Per Provider Order: No General:       Therapy/Group: Individual Therapy  [name] 05/10/2024, 3:43 PM
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
## 2024-05-11 LAB — GLUCOSE, CAPILLARY
Glucose-Capillary: 179 mg/dL — ABNORMAL HIGH (ref 70–99)
Glucose-Capillary: 144 mg/dL — ABNORMAL HIGH (ref 70–99)
Glucose-Capillary: 136 mg/dL — ABNORMAL HIGH (ref 70–99)
Glucose-Capillary: 194 mg/dL — ABNORMAL HIGH (ref 70–99)

## 2024-05-11 NOTE — Progress Notes (Signed)
 Occupational Therapy Session Note  Patient Details  Name: Jerome Vaughn MRN: [id number] Date of Birth: 1951/07/23  Today's Date: 05/11/2024 OT Individual Time: 9260-9140 OT Individual Time Calculation (min): 80 min    Short Term Goals: Week 2:  OT Short Term Goal 1 (Week 2): Pt will have improved standing tolerance to be able to manage his clothing over his hips to prepare for toileting tasks. OT Short Term Goal 2 (Week 2): Pt will have improved standing tolerance to be able to tolerate standing long enough to pivot to a BSC with CGA or less. OT Short Term Goal 3 (Week 2): Pt will demonstrate improved B hand strength to use hands to pull pants over hips with mod A or less. OT Short Term Goal 4 (Week 2): Pt will be able to lift Left arm actively to be able to reach under it during bathing.  Skilled Therapeutic Interventions/Progress Updates:  Pt greeted supine in bed, pt agreeable to OT intervention.      Transfers/bed mobility/functional mobility: pt completed supine>sit with supervision. Pt completed sit>stands and stand pivot transfers with rollator and CGA. Pt completed household distance functional ambulation in hallway with rollator and CGA.   BP from w/c after walking- 108/71( 83) HR 69      ADLs:  Grooming: pt completed seated shaving at sink as well as hair care with supervision/set- up assist.  LB dressing: donned pants from bed level with MIN A, min a needed to thread pants but pt able to bridge to pull pants to waist line Footwear: donned shoes with total A for time mgmt. Donned teds and ace wraps from bed level with total A.   Self feeding: pt completed self feeding from elevated HOB with set- up assist. Pt uses adaptive utensils with supervision but does require assist to open items or set- up his coffee.                  Ended session with pt seated in manual w/c with BLEs elevated with all needs within reach.           Therapy Documentation Precautions:   Precautions Precautions: Fall Recall of Precautions/Restrictions: Intact Precaution/Restrictions Comments: B rotator cuff tears Required Braces or Orthoses: Cervical Brace Cervical Brace: Soft collar, Other (comment) Other Brace: Can remove when in bed and when showering. May apply/remove when sitting Restrictions Weight Bearing Restrictions Per Provider Order: No  Pain: No pain    Therapy/Group: Individual Therapy  Ronal Gift [facility] 05/11/2024, 9:10 AM

## 2024-05-11 NOTE — Progress Notes (Signed)
 Physical Therapy Session Note  Patient Details  Name: Jerome Vaughn MRN: [id number] Date of Birth: 1951/01/27  Today's Date: 05/11/2024 PT Individual Time: 915 -1030 and 1123-1205 PT Individual Time Calculation (min): 75 min and 42 min   Short Term Goals: Week 3:  PT Short Term Goal 1 (Week 3): =LTGs d/t ELOS  Skilled Therapeutic Interventions/Progress Updates: Tx1: Pt presented in recliner agreeable to therapy. Pt denies pain at rest. Discussed preparation for d/c and pt states feeling more comfortable regarding d/c and mobility at home. Pt stood with supervision and ambulated to [place] room with rollator and CGA fading to close supervision. Participated in seated/standing LE therex as noted below. Pt with no indication of any s/s of OH with standing activities.  Sit to stand with 2lb dowel  Ball taps seated with 2lb dowel LAQ 3lb ankle weight Hip abd initially with 3lb weight however noted decreased control therefore dropped to 1.5 and noted improved technique Seated hip flexion with 1.5lb weight Standing hamstring curls with 1.5lb weight  Pt then ambulated to [place] room and participated in ambulation stepping over cones with pt having only one instance of hitting a cone when performed with LLE. Pt also participated in standing balance activity while standing on Airex tolerating moderate perturbations and ultimately performing without UE support. Pt able to perform this activity with both feet apart and feet touching. Pt also able to perform alternating shoulder flexion to 90 x 30 seconds both feet together/closed with no LOB. After brief rest pt ambulated back to room in same manner as prior and returned to recliner. Pt left seated in recliner at end of session with call bell within reach and needs met.   142/80 (99) HR58 Start of session 136/84 (99) HR 60 end of session  Tx2: Pt presented in recliner agreeable to therapy. Pt states mild unrated pain during session at knee but no intervention  requested. Pt requesting to use urinal prior to leaving room. Pt able to stand with void with supervision and increased time. No s/s OH with static stand or voiding. Pt then ambulated to [place] room and participated in standing balance activity placing green clothespins on basketball net while standing on cobblefoam. Pt able to progress to picking up clothespins from rollator seat maintaining CGA balance. Pt also worked on Sit to stand with red wedge under heels to promote weightshifting throughout forefoot to stand vs pushing back on heels (or pushing against mat). Pt then ambulated over to NuStep and participated in NuStep L4 x 11 min using x 4 extremities for general conditioning. Pt able to maintain ~60SPM with activity. After seated rest pt ambulated back to room and returned to recliner. Pt left seated in recliner at end of session with call bell within reach and needs met.      Therapy Documentation Precautions:  Precautions Precautions: Fall Recall of Precautions/Restrictions: Intact Precaution/Restrictions Comments: B rotator cuff tears Required Braces or Orthoses: Cervical Brace Cervical Brace: Soft collar, Other (comment) Other Brace: Can remove when in bed and when showering. May apply/remove when sitting Restrictions Weight Bearing Restrictions Per Provider Order: No General:   Vital Signs: Therapy Vitals Temp: 97.7 F (36.5 C) Temp Source: Oral Pulse Rate: 72 Resp: 17 BP: 133/89 Patient Position (if appropriate): Lying Oxygen Therapy SpO2: 100 % O2 Device: Room Air    Therapy/Group: Individual Therapy  Brentney Goldbach 05/11/2024, 4:29 PM
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
## 2024-05-12 LAB — GLUCOSE, CAPILLARY
Glucose-Capillary: 138 mg/dL — ABNORMAL HIGH (ref 70–99)
Glucose-Capillary: 171 mg/dL — ABNORMAL HIGH (ref 70–99)
Glucose-Capillary: 306 mg/dL — ABNORMAL HIGH (ref 70–99)
Glucose-Capillary: 191 mg/dL — ABNORMAL HIGH (ref 70–99)

## 2024-05-12 MED ORDER — ATOMOXETINE HCL 25 MG PO CAPS
50.0000 mg | ORAL_CAPSULE | Freq: Every day | ORAL | Status: DC
  Administered 2024-05-13 – 2024-05-17 (×5): 50 mg via ORAL
  Filled 2024-05-12 (×5): qty 2

## 2024-05-12 NOTE — Progress Notes (Signed)
 Physical Therapy Session Note  Patient Details  Name: Jerome Vaughn MRN: [id number] Date of Birth: [birth year]  Today's Date: 05/12/2024 PT Individual Time: 0815-0915 PT Individual Time Calculation (min): 60  min   Short Term Goals: Week 3:  PT Short Term Goal 1 (Week 3): =LTGs d/t ELOS  Skilled Therapeutic Interventions/Progress Updates:    Pt missed x 15 min of scheduled therapy d/t high BP right before session and MD requesting pt rest without compression garments before beginning therapy. On recheck, pt's vitals had stabilized and symptoms of headache and nausea resolved, so pt was cleared to participate. Vitals remained stable through rest of session. Once headache resolved, No complaint of pain.   Donned ted hose and ace wraps tot a for time. Donned pants with assist to thread only and shoes tot a for time. Sit to stand and ambulation to therapy gym with CGA to supervision at times. Pt participated in obstacle course including 6 and 3 stairs, stepping and turning on compliant surfaces, and compliant wedge with handrail. On 3rd lap, pt began to report wooziness and required quick seat for safety. Pt reports this felt more like low blood sugar, BP was WNL. Pt drank orange juice and rested several minutes before feeling up to walking back to room. Pt ambulated back to room with CGA and remained in recliner, was left with all needs in reach and alarm active.   Therapy Documentation Precautions:  Precautions Precautions: Fall Recall of Precautions/Restrictions: Intact Precaution/Restrictions Comments: B rotator cuff tears Required Braces or Orthoses: Cervical Brace Cervical Brace: Soft collar, Other (comment) Other Brace: Can remove when in bed and when showering. May apply/remove when sitting Restrictions Weight Bearing Restrictions Per Provider Order: No General:       Therapy/Group: Individual Therapy  [name] 05/12/2024, 4:16 PM
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
## 2024-05-12 NOTE — Plan of Care (Signed)
  Problem: RH Bathing Goal: LTG Patient will bathe all body parts with assist levels (OT) Description: LTG: Patient will bathe all body parts with assist levels (OT) Flowsheets (Taken 05/12/2024 1306) LTG: Pt will perform bathing with assistance level/cueing: (LTG downgraded due to limited AROM of B shoulders.) Moderate Assistance - Patient 50 - 74% Note: LTG downgraded due to limited AROM of B shoulders.    Problem: RH Dressing Goal: LTG Patient will perform upper body dressing (OT) Description: LTG Patient will perform upper body dressing with assist, with/without cues (OT). Flowsheets (Taken 05/12/2024 1306) LTG: Pt will perform upper body dressing with assistance level of: (LTG downgraded due to limited AROM of B shoulders.) Moderate Assistance - Patient 50 - 74% Note: LTG downgraded due to limited AROM of B shoulders.  Goal: LTG Patient will perform lower body dressing w/assist (OT) Description: LTG: Patient will perform lower body dressing with assist, with/without cues in positioning using equipment (OT) Flowsheets (Taken 05/12/2024 1306) LTG: Pt will perform lower body dressing with assistance level of: (LTG downgraded due to limited AROM of B shoulders and difficulty reaching forward with neck pain.) Moderate Assistance - Patient 50 - 74% Note: LTG downgraded due to limited AROM of B shoulders and difficulty reaching forward with neck pain.    Problem: RH Toileting Goal: LTG Patient will perform toileting task (3/3 steps) with assistance level (OT) Description: LTG: Patient will perform toileting task (3/3 steps) with assistance level (OT)  Flowsheets (Taken 05/12/2024 1306) LTG: Pt will perform toileting task (3/3 steps) with assistance level: (LTG downgraded due to limited AROM of B shoulders and use  of hands for adequate cleansing.) Moderate Assistance - Patient 50 - 74% Note: LTG downgraded due to limited AROM of B shoulders and use of hands for adequate cleansing.

## 2024-05-12 NOTE — Progress Notes (Signed)
 Recreational Therapy Session Note  Patient Details  Name: Jerome Vaughn MRN: [id number] Date of Birth: 10-Jul-1951 Today's Date: 05/12/2024  Pain: no c/o Skilled Therapeutic Interventions/Progress Updates: Met with pt today to discuss community reintegration/outing including purpose of the outing and potential goals.  Pt feels an outing would be beneficial to both he and his wife.  Pinchos Topel 05/12/2024, 1:03 PM

## 2024-05-12 NOTE — Progress Notes (Signed)
 Occupational Therapy Weekly Progress Note  Patient Details  Name: Jerome Vaughn MRN: [id number] Date of Birth: [birth year]  Beginning of progress report period: May 02, 2024 End of progress report period: May 12, 2024  Today's Date: 05/12/2024 OT Individual Time: 8881-8799 OT Individual Time Calculation (min): 42 min    Patient has met 3 of 4 short term goals.  Pt is making great progress with B hand and elbow strength. He is also progressing with sh AROM but it continues to be limited to 40-45 degrees on the L and 50-60 on the R which makes many tasks with self care challenging for him to do. Overall he is progressing with his general mobility and ability to engage in some tasks.   Patient continues to demonstrate the following deficits: muscle weakness and muscle joint tightness, decreased cardiorespiratoy endurance, unbalanced muscle activation and decreased coordination, and decreased standing balance and therefore will continue to benefit from skilled OT intervention to enhance overall performance with BADL.  Patient not progressing toward long term goals.  See goal revision..  Plan of care revisions: :.  Problem: RH Bathing Goal: LTG Patient will bathe all body parts with assist levels (OT) Description: LTG: Patient will bathe all body parts with assist levels (OT) Flowsheets (Taken 05/12/2024 1306) LTG: Pt will perform bathing with assistance level/cueing: (LTG downgraded due to limited AROM of B shoulders.) Moderate Assistance - Patient 50 - 74% Note: LTG downgraded due to limited AROM of B shoulders.    Problem: RH Dressing Goal: LTG Patient will perform upper body dressing (OT) Description: LTG Patient will perform upper body dressing with assist, with/without cues (OT). Flowsheets (Taken 05/12/2024 1306) LTG: Pt will perform upper body dressing with assistance level of: (LTG downgraded due to limited AROM of B shoulders.) Moderate Assistance - Patient 50 - 74% Note: LTG  downgraded due to limited AROM of B shoulders.  Goal: LTG Patient will perform lower body dressing w/assist (OT) Description: LTG: Patient will perform lower body dressing with assist, with/without cues in positioning using equipment (OT) Flowsheets (Taken 05/12/2024 1306) LTG: Pt will perform lower body dressing with assistance level of: (LTG downgraded due to limited AROM of B shoulders and difficulty reaching forward with neck pain.) Moderate Assistance - Patient 50 - 74% Note: LTG downgraded due to limited AROM of B shoulders and difficulty reaching forward with neck pain.    Problem: RH Toileting Goal: LTG Patient will perform toileting task (3/3 steps) with assistance level (OT) Description: LTG: Patient will perform toileting task (3/3 steps) with assistance level (OT)  Flowsheets (Taken 05/12/2024 1306) LTG: Pt will perform toileting task (3/3 steps) with assistance level: (LTG downgraded due to limited AROM of B shoulders and use  of hands for adequate cleansing.) Moderate Assistance - Patient 50 - 74% Note: LTG downgraded due to limited AROM of B shoulders and use of hands for adequate cleansing.     OT Short Term Goals Week 1:  OT Short Term Goal 1 (Week 1): Pt will consistently direct care with family and staff regarding use/need of AE and orthotics for self feeding and/or daily tasks. OT Short Term Goal 1 - Progress (Week 1): Met OT Short Term Goal 2 (Week 1): Pt will completed UB dressing with Max A and use of compensatory techniques. OT Short Term Goal 2 - Progress (Week 1): Met OT Short Term Goal 3 (Week 1): Pt will completed simple grooming task such as brushing teeth with Max A and use of adaptive equipment  OT Short Term Goal 3 - Progress (Week 1): Met Week 2:  OT Short Term Goal 1 (Week 2): Pt will have improved standing tolerance to be able to manage his clothing over his hips to prepare for toileting tasks. OT Short Term Goal 1 - Progress (Week 2): Met OT Short Term Goal 2  (Week 2): Pt will have improved standing tolerance to be able to tolerate standing long enough to pivot to a BSC with CGA or less. OT Short Term Goal 2 - Progress (Week 2): Met OT Short Term Goal 3 (Week 2): Pt will demonstrate improved B hand strength to use hands to pull pants over hips with mod A or less. OT Short Term Goal 3 - Progress (Week 2): Met OT Short Term Goal 4 (Week 2): Pt will be able to lift Left arm actively to be able to reach under it during bathing. OT Short Term Goal 4 - Progress (Week 2): Progressing toward goal Week 3:  OT Short Term Goal 1 (Week 3): STGs = LTGs  Skilled Therapeutic Interventions/Progress Updates:    Pt received in recliner and agreeable to therapy. -sit to stands and standing tolerance with pt working on using B hands to push his pants down with supervision,  stand to urinate in urinal and pull pants up over hips with only 10% A in back. CGA with sit to stand and standing -pt did get light headed after standing for 2-3 min and needed to sit suddenly -reviewed with pt symptoms of AD using information packet in his binder -AAROM of B shoulders 1 at a time as pt has neck pain when trying to use B at once - used dowel bar to faciliate sh flex and extension pushing vertical bar like a level and doing progressive reaching up the bar -AROM of R wrist extension using bar with rotating bar -pt reports he has been self feeding well   Pt resting in recliner with all needs met.   Therapy Documentation Precautions:  Precautions Precautions: Fall Recall of Precautions/Restrictions: Intact Precaution/Restrictions Comments: B rotator cuff tears Required Braces or Orthoses: Cervical Brace Cervical Brace: Soft collar, Other (comment) Other Brace: Can remove when in bed and when showering. May apply/remove when sitting Restrictions Weight Bearing Restrictions Per Provider Order: No    Vital Signs: Therapy Vitals Pulse Rate: 70 BP: 113/80 Patient Position (if  appropriate): Lying Pain:  Mild pain in upper neck, provided pt with modified exercises, rest and gentle massage to upper traps  ADL: ADL Eating: Set up Where Assessed-Eating: Bed level Grooming: Setup Where Assessed-Grooming: Wheelchair Upper Body Bathing: Maximal assistance Where Assessed-Upper Body Bathing: Sitting at sink Lower Body Bathing: Moderate assistance Where Assessed-Lower Body Bathing: Sitting at sink Upper Body Dressing: Maximal assistance Where Assessed-Upper Body Dressing: Wheelchair Lower Body Dressing: Maximal assistance Where Assessed-Lower Body Dressing: Wheelchair Toileting: Maximal assistance Where Assessed-Toileting: Toilet, Psychiatrist Transfer: Minimal Dentist Method: Surveyor, minerals: Bedside commode, Grab bars Tub/Shower Transfer: Not assessed Film/video editor: Not assessed   Therapy/Group: Individual Therapy  Delorean Knutzen 05/12/2024, 1:19 PM

## 2024-05-12 NOTE — Progress Notes (Signed)
 Physical Therapy Session Note  Patient Details  Name: Jerome Vaughn MRN: [id number] Date of Birth: 02/07/1951  Today's Date: 05/12/2024 PT Individual Time: 1305-1420 PT Individual Time Calculation (min): 75 min   Short Term Goals: Week 3:  PT Short Term Goal 1 (Week 3): =LTGs d/t ELOS  Skilled Therapeutic Interventions/Progress Updates: Pt presented in bathroom with NT present preparing to toilet. Handoff from NT, with pt able to complete toilet transfers and clothing management with supervision. Once completed pt returned to recliner and discussed potential outing tomorrow as well as mobility outside hospital this am. Also discussed increased BP this am as well. BP checked prior to leaving room 119/82 (94) HR 59. Discussed energy conservation techniques and pt agreeable to use w/c to transport to [facility] entrance with rollator and if has enough energy but will attempt to ambulate back to unit. Pt completed ambulatory transfer to w/c without AD and CGA for increased balance challenge. Pt transported to [facility] entrance and participated in several bouts of ambulation with rollator with CGA. Pt ambulated bouts of 200-4103ft on uneven surfaces and up/down grades  with CGA overall and demonstrating good safety. Pt verbalizing noted difference between ambulating on level tile vs outside surfaces and the increased exertion required. Also discussed option of obtaining transport chair for longer distances which had been discussed with primary therapist. After extended rest pt ambulated back to unit with pt able to perform without seated rest back to unity (standing on elevator). In [place] room pt participated in gait/balance challenge weaving through cones without AD and requiring CGA however increased sway noted. Pt returned to room at end of session and returned to recliner with call bell within reach and needs met.        Therapy Documentation Precautions:  Precautions Precautions: Fall Recall of  Precautions/Restrictions: Intact Precaution/Restrictions Comments: B rotator cuff tears Required Braces or Orthoses: Cervical Brace Cervical Brace: Soft collar, Other (comment) Other Brace: Can remove when in bed and when showering. May apply/remove when sitting Restrictions Weight Bearing Restrictions Per Provider Order: No General:   Vital Signs: Therapy Vitals Pulse Rate: 60 Resp: 18 BP: 119/82 Patient Position (if appropriate): Lying Oxygen Therapy SpO2: 100 % O2 Device: Room Air Pain: Pain Assessment Pain Scale: 0-10 Pain Score: 0-No pain   Therapy/Group: Individual Therapy  Denis Carreon 05/12/2024, 3:43 PM

## 2024-05-13 LAB — COMPREHENSIVE METABOLIC PANEL WITH GFR
ALT: 64 U/L — ABNORMAL HIGH (ref 0–44)
AST: 33 U/L (ref 15–41)
Albumin: 3.2 g/dL — ABNORMAL LOW (ref 3.5–5.0)
Alkaline Phosphatase: 41 U/L (ref 38–126)
Anion gap: 12 (ref 5–15)
BUN: 13 mg/dL (ref 8–23)
CO2: 25 mmol/L (ref 22–32)
Calcium: 9.3 mg/dL (ref 8.9–10.3)
Chloride: 102 mmol/L (ref 98–111)
Creatinine, Ser: 0.94 mg/dL (ref 0.61–1.24)
GFR, Estimated: >60 mL/min (ref >60)
Glucose, Bld: 162 mg/dL — ABNORMAL HIGH (ref 70–99)
Potassium: 4 mmol/L (ref 3.5–5.1)
Sodium: 139 mmol/L (ref 135–145)
Total Bilirubin: 0.5 mg/dL (ref 0.0–1.2)
Total Protein: 7.2 g/dL (ref 6.5–8.1)

## 2024-05-13 LAB — CBC WITH DIFFERENTIAL/PLATELET
Abs Immature Granulocytes: 0.01 K/uL (ref 0.00–0.07)
Basophils Absolute: 0 K/uL (ref 0.0–0.1)
Basophils Relative: 1 %
Eosinophils Absolute: 0.1 K/uL (ref 0.0–0.5)
Eosinophils Relative: 2 %
HCT: 35.5 % — ABNORMAL LOW (ref 39.0–52.0)
Hemoglobin: 11.9 g/dL — ABNORMAL LOW (ref 13.0–17.0)
Immature Granulocytes: 0 %
Lymphocytes Relative: 33 %
Lymphs Abs: 1.3 K/uL (ref 0.7–4.0)
MCH: 29 pg (ref 26.0–34.0)
MCHC: 33.5 g/dL (ref 30.0–36.0)
MCV: 86.4 fL (ref 80.0–100.0)
Monocytes Absolute: 0.5 K/uL (ref 0.1–1.0)
Monocytes Relative: 12 %
Neutro Abs: 2.1 K/uL (ref 1.7–7.7)
Neutrophils Relative %: 52 %
Platelets: 160 K/uL (ref 150–400)
RBC: 4.11 MIL/uL — ABNORMAL LOW (ref 4.22–5.81)
RDW: 13.2 % (ref 11.5–15.5)
WBC: 4 K/uL (ref 4.0–10.5)
nRBC: 0 % (ref 0.0–0.2)

## 2024-05-13 LAB — GLUCOSE, CAPILLARY
Glucose-Capillary: 159 mg/dL — ABNORMAL HIGH (ref 70–99)
Glucose-Capillary: 196 mg/dL — ABNORMAL HIGH (ref 70–99)
Glucose-Capillary: 264 mg/dL — ABNORMAL HIGH (ref 70–99)
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
## 2024-05-13 NOTE — Progress Notes (Signed)
 Physical Therapy Session Note  Patient Details  Name: Jerome Vaughn MRN: [id number] Date of Birth: [birth year]  Today's Date: 05/13/2024 PT Individual Time: 1038-1205 and 1448-1530 PT Individual Time Calculation (min): 87 min and 42 min  Short Term Goals: Week 3:  PT Short Term Goal 1 (Week 3): =LTGs d/t ELOS  Skilled Therapeutic Interventions/Progress Updates: Tx1: Pt presented in recliner with wife present agreeable to therapy. Session initially scheduled as outing however due to transportation issues unable to perform. Discussed at length with pt and wife energy conservation, planning excursions (MD appts/church/etc). Discussed knowing location of places to rest if rollator not accessible (bench etc) or if need to use bathroom. Pt then stood with supervision and ambulated with rollator to elevators down to [facility] entrance. In atrium pt indicating need for bathroom. Pt using public bathroom with PTA providing education regarding accessible stall, options if accessible stall not avail, and RW management in accessible stall. Once completed provided briefing to wife and discussed possible similar situation which this may arise. Pt then ambulated outside and with rollator ambulated down/up longer slope alongside [facility] building with seated rest break at bottom of slope. Slope ambulation with CGA. Pt then ambulated throughout [facility] patio with CGA fading to supervision with pt taking seated rest breaks at varying surfaces. Pt then returned inside and obtained lunch with wife. While food was prepped educated pt on times like this for utilizing rollator for energy conservation however pt was able to maintain static stand with rollator while looking at menu and ordering food with no s/s of [place]. Pt and wife returned to unit in same manner as prior without rest breaks and PTA encouraged pt and wife to maintain current activity levels. Upon return to room pt returned to recliner and pt left seated with call bell within reach,  wife present, and current needs met.   Tx2: Pt presented in recliner agreeable to therapy. Pt c/o mild generalized pain in B shoulders/upper arms. Discussed earlier session with pt stating only mild fatigue afterwards. NT present upon entering with BP 150/80 in sitting. Pt stood with supervision and mild retropulsion and ambulated with supervision to [place] room. Pt set up on NuStep and participated in L3 x 5 min for cardiovascular conditioning. BP after NuStep 117/78 with no symptoms. Pt then ambulated to mat with PTA inquiring about any hurdles they foresee prior to d/c. Pt only mentioning possibility of obtaining bed rail as they have a wedge. Agreeable to practice bed mobility. Pt ambulated to ADL apt and demonstrated rail set up. PTA also showing pt on [employer] some possible options. PTA set up bed with large wedge with pt able to complete bed mobility with supervision for both sit to/from supine. Pt also noted that night stand is slightly taller than bed to which this therapist indicated may trial use of nightstand prior to purchasing bed rail. Pt then ambulated back to room without AD with CGA for increased balance challenge, PTA providing verbal cues for shoulder depression as well as increasing arm swing. Pt returned to recliner at end of session and left with call bell within reach and needs met.      Therapy Documentation Precautions:  Precautions Precautions: Fall Recall of Precautions/Restrictions: Intact Precaution/Restrictions Comments: B rotator cuff tears Required Braces or Orthoses: Cervical Brace Cervical Brace: Soft collar, Other (comment) Other Brace: Can remove when in bed and when showering. May apply/remove when sitting Restrictions Weight Bearing Restrictions Per Provider Order: No General:   Vital Signs: Therapy Vitals  Pulse Rate: 76 BP: 137/89 Patient Position (if appropriate): Sitting Pain: Pain Assessment Pain Scale: 0-10 Pain Score: 0-No pain   Therapy/Group:  Individual Therapy  Levaeh Vice 05/13/2024, 12:25 PM

## 2024-05-13 NOTE — Progress Notes (Signed)
 Occupational Therapy Session Note  Patient Details  Name: Jerome Vaughn MRN: [id number] Date of Birth: April 29, 1951  Today's Date: 05/13/2024 OT Individual Time: 9164-9069 OT Individual Time Calculation (min): 55 min    Short Term Goals: Week 3:  OT Short Term Goal 1 (Week 3): STGs = LTGs   Skilled Therapeutic Interventions/Progress Updates:    1:1 Pt received in the bed. BP stable and recorded. Pt's TEDS and ACEs donned along with abdominal binder sitting EOB. Pt ambulated to the bathroom with Rollator with supervision including transfers to the toilet and then the shower bench. Pt showered with TEDS and ACEs donned to help maintain his BP. Pt then transferred to rollator to come out of hte shower and was pushed to the recliner to dress. Problem solving showering at home and maintaining safe BP stats - dressing in the bathroom and wearing orthosis. Pt able to don shirt with min A and LB pants with setup. A for shoes and orthosis. Pt left sitting in the recliner.   Therapy Documentation Precautions:  Precautions Precautions: Fall Recall of Precautions/Restrictions: Intact Precaution/Restrictions Comments: B rotator cuff tears Required Braces or Orthoses: Cervical Brace Cervical Brace: Soft collar, Other (comment) Other Brace: Can remove when in bed and when showering. May apply/remove when sitting Restrictions Weight Bearing Restrictions Per Provider Order: No  Pain: Pain Assessment Pain Scale: 0-10 Pain Score: 0-No pain   Therapy/Group: Individual Therapy  [name] Nest [facility] 05/13/2024, 1:09 PM

## 2024-05-14 LAB — GLUCOSE, CAPILLARY
Glucose-Capillary: 165 mg/dL — ABNORMAL HIGH (ref 70–99)
Glucose-Capillary: 203 mg/dL — ABNORMAL HIGH (ref 70–99)
Glucose-Capillary: 189 mg/dL — ABNORMAL HIGH (ref 70–99)

## 2024-05-14 NOTE — Progress Notes (Signed)
 Physical Therapy Session Note  Patient Details  Name: REYMUNDO WINSHIP MRN: [id number] Date of Birth: [birth year]  Today's Date: 05/14/2024 PT Individual Time: 0930-1040  PT Individual Time Calculation (min): 70 min  Short Term Goals: Week 3:  PT Short Term Goal 1 (Week 3): =LTGs d/t ELOS  Skilled Therapeutic Interventions/Progress Updates:  Chart reviewed and pt agreeable to therapy. Pt received semi-reclined in bed with 0/10 c/o pain. Also of note, soft collar already donned. Session focused on bed mobility, functional transfers, balance, and ambulation to promote safe home mobility and access. Pt required maxA to donn TED hose and ace wraps on BLE. BP then assessed and documented below. Pt required modA for donning pants and shoes. Ab binder donned for remainder of session. Pt then amb 271ft using S + rollator to therapy gym. Pt then completed blocked practice of stair navigation completing rounds of 4-8 6 steps using S + B handrails. Pt noted home recently had B handrails installed. Pt then completed blocked practice of amb with no AD with pt completing at most 120ft using CGA and fading to close S. Pt noted to have good safety awareness during turns and maintained good balance with amb while completing dual task of conversing while amb with no  AD. Pt then amb back to room and transferred to recline using S + rollator. Session education emphasized continuing care after discharge for continued return to function. At end of session, pt was left seated in recliner with alarm engaged, nurse call bell and all needs in reach.  Supine: 155/95 mmHg Sitting (after transfer from supine): 133/93 mmHg Sitting (after 2 mins seated rest): 139/88 mmHg   Therapy Documentation Precautions:  Precautions Precautions: Fall Recall of Precautions/Restrictions: Intact Precaution/Restrictions Comments: B rotator cuff tears Required Braces or Orthoses: Cervical Brace Cervical Brace: Soft collar, Other  (comment) Other Brace: Can remove when in bed and when showering. May apply/remove when sitting Restrictions Weight Bearing Restrictions Per Provider Order: No General:      Therapy/Group: Individual Therapy   Warrick KANDICE Raspberry 05/14/2024, 12:10 PM
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
## 2024-05-14 NOTE — Progress Notes (Signed)
 Patient last bowel movement 05/13/24. As ordered patient is not to receive bowl program unless patient has not had bowel movement for two days. Patient is only one [place] out from last bowel movement. Bowel program is not to be completed today. Fredie LITTIE Morones, RN

## 2024-05-14 NOTE — Plan of Care (Signed)
  Problem: Consults Goal: RH SPINAL CORD INJURY PATIENT EDUCATION Description:  See Patient Education module for education specifics.  Outcome: Progressing   Problem: SCI BOWEL ELIMINATION Goal: RH STG MANAGE BOWEL WITH ASSISTANCE Description: STG Manage Bowel with supervision-min Assistance. Outcome: Progressing   Problem: SCI BLADDER ELIMINATION Goal: RH STG MANAGE BLADDER WITH ASSISTANCE Description: STG Manage Bladder With supervision-min Assistance Outcome: Progressing   Problem: RH SKIN INTEGRITY Goal: RH STG SKIN FREE OF INFECTION/BREAKDOWN Description: Manage skin free of infection with supervision - min assistance Outcome: Progressing   Problem: RH SAFETY Goal: RH STG ADHERE TO SAFETY PRECAUTIONS W/ASSISTANCE/DEVICE Description: STG Adhere to Safety Precautions With supervision- min Assistance/Device. Outcome: Progressing   Problem: RH PAIN MANAGEMENT Goal: RH STG PAIN MANAGED AT OR BELOW PT'S PAIN GOAL Description: <4 w/ prns Outcome: Progressing   Problem: Education: Goal: Ability to describe self-care measures that may prevent or decrease complications (Diabetes Survival Skills Education) will improve Outcome: Progressing Goal: Individualized Educational Video(s) Outcome: Progressing   Problem: Coping: Goal: Ability to adjust to condition or change in health will improve Outcome: Progressing   Problem: Fluid Volume: Goal: Ability to maintain a balanced intake and output will improve Outcome: Progressing   Problem: Health Behavior/Discharge Planning: Goal: Ability to identify and utilize available resources and services will improve Outcome: Progressing Goal: Ability to manage health-related needs will improve Outcome: Progressing   Problem: Metabolic: Goal: Ability to maintain appropriate glucose levels will improve Outcome: Progressing   Problem: Nutritional: Goal: Maintenance of adequate nutrition will improve Outcome: Progressing Goal: Progress  toward achieving an optimal weight will improve Outcome: Progressing   Problem: Skin Integrity: Goal: Risk for impaired skin integrity will decrease Outcome: Progressing   Problem: Tissue Perfusion: Goal: Adequacy of tissue perfusion will improve Outcome: Progressing

## 2024-05-14 NOTE — Plan of Care (Signed)
  Problem: Consults Goal: RH SPINAL CORD INJURY PATIENT EDUCATION Description:  See Patient Education module for education specifics.  Outcome: Progressing   Problem: SCI BOWEL ELIMINATION Goal: RH STG MANAGE BOWEL WITH ASSISTANCE Description: STG Manage Bowel with supervision-min Assistance. Outcome: Progressing   Problem: SCI BLADDER ELIMINATION Goal: RH STG MANAGE BLADDER WITH ASSISTANCE Description: STG Manage Bladder With supervision-min Assistance Outcome: Progressing   Problem: RH SKIN INTEGRITY Goal: RH STG SKIN FREE OF INFECTION/BREAKDOWN Description: Manage skin free of infection with supervision - min assistance Outcome: Progressing   Problem: RH SAFETY Goal: RH STG ADHERE TO SAFETY PRECAUTIONS W/ASSISTANCE/DEVICE Description: STG Adhere to Safety Precautions With supervision- min Assistance/Device. Outcome: Progressing   Problem: RH PAIN MANAGEMENT Goal: RH STG PAIN MANAGED AT OR BELOW PT'S PAIN GOAL Description: <4 w/ prns Outcome: Progressing   Problem: RH KNOWLEDGE DEFICIT SCI Goal: RH STG INCREASE KNOWLEDGE OF SELF CARE AFTER SCI Description: Manage increase knowledge of self care after SCI with supervision- min assistance from wife using educational materials provided Outcome: Progressing

## 2024-05-15 LAB — GLUCOSE, CAPILLARY
Glucose-Capillary: 176 mg/dL — ABNORMAL HIGH (ref 70–99)
Glucose-Capillary: 204 mg/dL — ABNORMAL HIGH (ref 70–99)
Glucose-Capillary: 183 mg/dL — ABNORMAL HIGH (ref 70–99)
Glucose-Capillary: 168 mg/dL — ABNORMAL HIGH (ref 70–99)

## 2024-05-15 MED ORDER — BISACODYL 10 MG RE SUPP: 10.0000 mg | Freq: Every day | RECTAL | Status: DC | PRN

## 2024-05-15 MED ORDER — POLYETHYLENE GLYCOL 3350 17 G PO PACK: 17.0000 g | PACK | Freq: Every day | ORAL | Status: AC

## 2024-05-15 MED ORDER — DOCUSATE SODIUM 100 MG PO CAPS: 100.0000 mg | ORAL_CAPSULE | Freq: Two times a day (BID) | ORAL | Status: AC
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
## 2024-05-15 NOTE — Plan of Care (Signed)
  Problem: Consults Goal: RH SPINAL CORD INJURY PATIENT EDUCATION Description:  See Patient Education module for education specifics.  Outcome: Progressing   Problem: SCI BOWEL ELIMINATION Goal: RH STG MANAGE BOWEL WITH ASSISTANCE Description: STG Manage Bowel with supervision-min Assistance. Outcome: Progressing   Problem: SCI BLADDER ELIMINATION Goal: RH STG MANAGE BLADDER WITH ASSISTANCE Description: STG Manage Bladder With supervision-min Assistance Outcome: Progressing   Problem: RH SKIN INTEGRITY Goal: RH STG SKIN FREE OF INFECTION/BREAKDOWN Description: Manage skin free of infection with supervision - min assistance Outcome: Progressing   Problem: RH SAFETY Goal: RH STG ADHERE TO SAFETY PRECAUTIONS W/ASSISTANCE/DEVICE Description: STG Adhere to Safety Precautions With supervision- min Assistance/Device. Outcome: Progressing   Problem: RH PAIN MANAGEMENT Goal: RH STG PAIN MANAGED AT OR BELOW PT'S PAIN GOAL Description: <4 w/ prns Outcome: Progressing   Problem: RH KNOWLEDGE DEFICIT SCI Goal: RH STG INCREASE KNOWLEDGE OF SELF CARE AFTER SCI Description: Manage increase knowledge of self care after SCI with supervision- min assistance from wife using educational materials provided Outcome: Progressing

## 2024-05-15 NOTE — Plan of Care (Signed)
  Problem: Consults Goal: RH SPINAL CORD INJURY PATIENT EDUCATION Description:  See Patient Education module for education specifics.  Outcome: Progressing   Problem: SCI BOWEL ELIMINATION Goal: RH STG MANAGE BOWEL WITH ASSISTANCE Description: STG Manage Bowel with supervision-min Assistance. Outcome: Progressing   Problem: SCI BLADDER ELIMINATION Goal: RH STG MANAGE BLADDER WITH ASSISTANCE Description: STG Manage Bladder With supervision-min Assistance Outcome: Progressing   Problem: RH SKIN INTEGRITY Goal: RH STG SKIN FREE OF INFECTION/BREAKDOWN Description: Manage skin free of infection with supervision - min assistance Outcome: Progressing   Problem: RH SAFETY Goal: RH STG ADHERE TO SAFETY PRECAUTIONS W/ASSISTANCE/DEVICE Description: STG Adhere to Safety Precautions With supervision- min Assistance/Device. Outcome: Progressing   Problem: RH PAIN MANAGEMENT Goal: RH STG PAIN MANAGED AT OR BELOW PT'S PAIN GOAL Description: <4 w/ prns Outcome: Progressing   Problem: RH KNOWLEDGE DEFICIT SCI Goal: RH STG INCREASE KNOWLEDGE OF SELF CARE AFTER SCI Description: Manage increase knowledge of self care after SCI with supervision- min assistance from wife using educational materials provided Outcome: Progressing   Problem: Education: Goal: Ability to describe self-care measures that may prevent or decrease complications (Diabetes Financial trader) will improve Outcome: Progressing Goal: Individualized Educational Video(s) Outcome: Progressing   Problem: Coping: Goal: Ability to adjust to condition or change in health will improve Outcome: Progressing   Problem: Fluid Volume: Goal: Ability to maintain a balanced intake and output will improve Outcome: Progressing   Problem: Health Behavior/Discharge Planning: Goal: Ability to identify and utilize available resources and services will improve Outcome: Progressing Goal: Ability to manage health-related needs will  improve Outcome: Progressing   Problem: Metabolic: Goal: Ability to maintain appropriate glucose levels will improve Outcome: Progressing   Problem: Nutritional: Goal: Maintenance of adequate nutrition will improve Outcome: Progressing Goal: Progress toward achieving an optimal weight will improve Outcome: Progressing   Problem: Skin Integrity: Goal: Risk for impaired skin integrity will decrease Outcome: Progressing   Problem: Tissue Perfusion: Goal: Adequacy of tissue perfusion will improve Outcome: Progressing

## 2024-05-15 NOTE — Progress Notes (Signed)
 Patient had bowel movement today. As presented by orders patient does not need to have bowel program. Fredie LITTIE Morones, RN

## 2024-05-16 ENCOUNTER — Other Ambulatory Visit (HOSPITAL_COMMUNITY): Payer: Self-pay

## 2024-05-16 LAB — COMPREHENSIVE METABOLIC PANEL WITH GFR
ALT: 36 U/L (ref 0–44)
AST: 16 U/L (ref 15–41)
Albumin: 3.2 g/dL — ABNORMAL LOW (ref 3.5–5.0)
Alkaline Phosphatase: 42 U/L (ref 38–126)
Anion gap: 11 (ref 5–15)
BUN: 9 mg/dL (ref 8–23)
CO2: 25 mmol/L (ref 22–32)
Calcium: 9.5 mg/dL (ref 8.9–10.3)
Chloride: 102 mmol/L (ref 98–111)
Creatinine, Ser: 0.96 mg/dL (ref 0.61–1.24)
GFR, Estimated: >60 mL/min (ref >60)
Glucose, Bld: 148 mg/dL — ABNORMAL HIGH (ref 70–99)
Potassium: 3.9 mmol/L (ref 3.5–5.1)
Sodium: 138 mmol/L (ref 135–145)
Total Bilirubin: 0.5 mg/dL (ref 0.0–1.2)
Total Protein: 7.5 g/dL (ref 6.5–8.1)

## 2024-05-16 LAB — CBC WITH DIFFERENTIAL/PLATELET
Abs Immature Granulocytes: 0.02 K/uL (ref 0.00–0.07)
Basophils Absolute: 0 K/uL (ref 0.0–0.1)
Basophils Relative: 0 %
Eosinophils Absolute: 0.1 K/uL (ref 0.0–0.5)
Eosinophils Relative: 1 %
HCT: 36.9 % — ABNORMAL LOW (ref 39.0–52.0)
Hemoglobin: 12.1 g/dL — ABNORMAL LOW (ref 13.0–17.0)
Immature Granulocytes: 0 %
Lymphocytes Relative: 23 %
Lymphs Abs: 1.4 K/uL (ref 0.7–4.0)
MCH: 28.7 pg (ref 26.0–34.0)
MCHC: 32.8 g/dL (ref 30.0–36.0)
MCV: 87.6 fL (ref 80.0–100.0)
Monocytes Absolute: 0.6 K/uL (ref 0.1–1.0)
Monocytes Relative: 10 %
Neutro Abs: 3.9 K/uL (ref 1.7–7.7)
Neutrophils Relative %: 66 %
Platelets: 127 K/uL — ABNORMAL LOW (ref 150–400)
RBC: 4.21 MIL/uL — ABNORMAL LOW (ref 4.22–5.81)
RDW: 13.4 % (ref 11.5–15.5)
WBC: 6 K/uL (ref 4.0–10.5)
nRBC: 0 % (ref 0.0–0.2)

## 2024-05-16 LAB — GLUCOSE, CAPILLARY
Glucose-Capillary: 181 mg/dL — ABNORMAL HIGH (ref 70–99)
Glucose-Capillary: 169 mg/dL — ABNORMAL HIGH (ref 70–99)
Glucose-Capillary: 144 mg/dL — ABNORMAL HIGH (ref 70–99)
Glucose-Capillary: 271 mg/dL — ABNORMAL HIGH (ref 70–99)
Glucose-Capillary: 181 mg/dL — ABNORMAL HIGH (ref 70–99)
Glucose-Capillary: 137 mg/dL — ABNORMAL HIGH (ref 70–99)

## 2024-05-16 MED ORDER — TRAMADOL HCL 50 MG PO TABS
50.0000 mg | ORAL_TABLET | Freq: Four times a day (QID) | ORAL | 0 refills | Status: AC | PRN
  Filled 2024-05-16: qty 30, 8d supply, fill #0

## 2024-05-16 MED ORDER — FLUDROCORTISONE ACETATE 0.1 MG PO TABS
0.1000 mg | ORAL_TABLET | Freq: Every day | ORAL | 0 refills | Status: DC
  Filled 2024-05-16: qty 30, 30d supply, fill #0

## 2024-05-16 MED ORDER — BACLOFEN 5 MG PO TABS
5.0000 mg | ORAL_TABLET | Freq: Three times a day (TID) | ORAL | 0 refills | Status: DC
  Filled 2024-05-16: qty 90, 30d supply, fill #0

## 2024-05-16 MED ORDER — RANOLAZINE ER 500 MG PO TB12
500.0000 mg | ORAL_TABLET | Freq: Two times a day (BID) | ORAL | 0 refills | Status: DC
  Filled 2024-05-16: qty 60, 30d supply, fill #0

## 2024-05-16 MED ORDER — ATOMOXETINE HCL 25 MG PO CAPS
50.0000 mg | ORAL_CAPSULE | Freq: Every day | ORAL | 0 refills | Status: DC
  Filled 2024-05-16: qty 30, 15d supply, fill #0

## 2024-05-16 MED ORDER — DICLOFENAC SODIUM 1 % EX GEL
2.0000 g | Freq: Two times a day (BID) | CUTANEOUS | 0 refills | Status: AC | PRN
  Filled 2024-05-16: qty 100, 25d supply, fill #0

## 2024-05-16 MED ORDER — ATOMOXETINE HCL 25 MG PO CAPS
50.0000 mg | ORAL_CAPSULE | Freq: Every day | ORAL | 0 refills | Status: DC
  Filled 2024-05-16: qty 60, 30d supply, fill #0

## 2024-05-16 MED ORDER — APIXABAN 2.5 MG PO TABS
2.5000 mg | ORAL_TABLET | Freq: Two times a day (BID) | ORAL | Status: DC
  Administered 2024-05-16 – 2024-05-17 (×3): 2.5 mg via ORAL
  Filled 2024-05-16 (×3): qty 1

## 2024-05-16 MED ORDER — TAMSULOSIN HCL 0.4 MG PO CAPS
0.4000 mg | ORAL_CAPSULE | Freq: Every day | ORAL | 0 refills | Status: DC
  Filled 2024-05-16: qty 30, 30d supply, fill #0

## 2024-05-16 MED ORDER — LIDOCAINE 5 % EX PTCH
2.0000 | MEDICATED_PATCH | CUTANEOUS | 0 refills | Status: DC
  Filled 2024-05-16: qty 30, 15d supply, fill #0

## 2024-05-16 MED ORDER — METFORMIN HCL ER 500 MG PO TB24
1000.0000 mg | ORAL_TABLET | Freq: Two times a day (BID) | ORAL | 0 refills | Status: DC
  Filled 2024-05-16: qty 120, 30d supply, fill #0

## 2024-05-16 MED ORDER — GABAPENTIN 300 MG PO CAPS
300.0000 mg | ORAL_CAPSULE | Freq: Three times a day (TID) | ORAL | 0 refills | Status: DC | PRN
  Filled 2024-05-16: qty 60, 20d supply, fill #0

## 2024-05-16 MED ORDER — MIDODRINE HCL 10 MG PO TABS
10.0000 mg | ORAL_TABLET | Freq: Three times a day (TID) | ORAL | 0 refills | Status: DC
  Filled 2024-05-16: qty 90, 30d supply, fill #0

## 2024-05-16 MED ORDER — EZETIMIBE 10 MG PO TABS
10.0000 mg | ORAL_TABLET | Freq: Every day | ORAL | 0 refills | Status: DC
  Filled 2024-05-16: qty 30, 30d supply, fill #0

## 2024-05-16 MED ORDER — APIXABAN 2.5 MG PO TABS
2.5000 mg | ORAL_TABLET | Freq: Two times a day (BID) | ORAL | 0 refills | Status: DC
  Filled 2024-05-16: qty 60, 30d supply, fill #0

## 2024-05-16 MED ORDER — ROSUVASTATIN CALCIUM 40 MG PO TABS
40.0000 mg | ORAL_TABLET | Freq: Every day | ORAL | 0 refills | Status: AC
  Filled 2024-05-16: qty 30, 30d supply, fill #0

## 2024-05-16 MED ORDER — PANTOPRAZOLE SODIUM 40 MG PO TBEC
40.0000 mg | DELAYED_RELEASE_TABLET | Freq: Two times a day (BID) | ORAL | 0 refills | Status: AC
  Filled 2024-05-16: qty 60, 30d supply, fill #0

## 2024-05-16 MED ORDER — FERROUS SULFATE 325 (65 FE) MG PO TABS
325.0000 mg | ORAL_TABLET | Freq: Every day | ORAL | 0 refills | Status: AC
  Filled 2024-05-16: qty 30, 30d supply, fill #0

## 2024-05-16 MED ORDER — METHOCARBAMOL 500 MG PO TABS
1000.0000 mg | ORAL_TABLET | Freq: Three times a day (TID) | ORAL | 0 refills | Status: DC | PRN
  Filled 2024-05-16: qty 180, 30d supply, fill #0
  Filled 2024-05-16: qty 90, fill #0

## 2024-05-16 NOTE — Progress Notes (Signed)
 Occupational Therapy Discharge Summary  Patient Details  Name: Jerome Vaughn MRN: [id number] Date of Birth: [birth year]  Date of Discharge from OT service:May 16, 2024   Patient has met 9 of 9 long term goals due to {due un:6958348}.  Pt made steady progress with BADLs and functional transfers during this admission. Pt requires min A for bating tasks with sit<>stand in shower. UB dressing with min A and donning pants with setup assist. Tot A for donning footwear (Ted hose, Ace wraps, and shoes.) Pt is able to feed self with setup using adaptive utensils. Pt has been issued a HEP handout for hand strengthening and FMC. Functional transfers with CGA/supervision. Toileting with mod A. Pt's wife has been present for therapy sessions. Patient to discharge at overall {LOA:3049010} level.  Patient's care partner {care partner:[id number]} to provide the necessary {assistance:3041652} assistance at discharge.    Reasons goals not met: n/a  Recommendation:  Patient will benefit from ongoing skilled OT services in {setting:3041680} to continue to advance functional skills in the area of {ADL/iADL:3041649}.  Equipment: No equipment provided  Reasons for discharge: {Reason for discharge:3049018}  Patient/family agrees with progress made and goals achieved: {Pt/Family agree with progress/goals:3049020}  OT Discharge Precautions/Restrictions  Precautions Precautions: Fall;Cervical Recall of Precautions/Restrictions: Intact Required Braces or Orthoses: Cervical Brace Cervical Brace: Soft collar;Other (comment) Other Brace: Can remove when in bed and when showering. May apply/remove when sitting Restrictions Weight Bearing Restrictions Per Provider Order: No ADL ADL Eating: Set up Where Assessed-Eating: Wheelchair Grooming: Setup Where Assessed-Grooming: Sitting at sink, Standing at sink Upper Body Bathing: Supervision/safety Where Assessed-Upper Body Bathing: Shower Lower Body Bathing: Minimal  assistance Where Assessed-Lower Body Bathing: Shower Upper Body Dressing: Moderate assistance Where Assessed-Upper Body Dressing: Sitting at sink Lower Body Dressing: Moderate assistance Where Assessed-Lower Body Dressing: Sitting at sink, Standing at sink Toileting: Moderate assistance Where Assessed-Toileting: Bedside Commode, Toilet Toilet Transfer: Close supervision Toilet Transfer Method: Proofreader: Bedside commode, Grab bars Tub/Shower Transfer: Not assessed Film/video editor: Close supervision Film/video editor Method: Ambulating Vision Baseline Vision/History: 1 Wears glasses Patient Visual Report: No change from baseline Vision Assessment?: No apparent visual deficits Perception  Perception: Within Functional Limits Praxis Praxis: WFL Cognition Cognition Overall Cognitive Status: Within Functional Limits for tasks assessed Arousal/Alertness: Awake/alert Memory: Appears intact Awareness: Appears intact Problem Solving: Appears intact Safety/Judgment: Appears intact Brief Interview for Mental Status (BIMS) Repetition of Three Words (First Attempt): 3 Temporal Orientation: Year: Correct Temporal Orientation: Month: Accurate within 5 days Temporal Orientation: [place]: Correct Recall: Sock: Yes, no cue required Recall: Blue: Yes, no cue required Recall: Bed: Yes, no cue required BIMS Summary Score: 15 Sensation Sensation Light Touch: Impaired by gross assessment (Pt reports numbness in BUE fingertips) Hot/Cold: Appears Intact Proprioception: Appears Intact Stereognosis: Impaired by gross assessment Additional Comments: Pt reports bilateral fingertip numbness which is on-going. Coordination Gross Motor Movements are Fluid and Coordinated: No Fine Motor Movements are Fluid and Coordinated: No Coordination and Movement Description: decreased coordination in BUE and BLE Finger Nose Finger Test: Impaired BUE 9 Hole Peg  Test: L-122.9 secs R: unable to complete Motor  Motor Motor: Within Functional Limits Motor - Skilled Clinical Observations: decreased coordination in BLE with functional gait and some truncal impairments Mobility     Trunk/Postural Assessment  Cervical Assessment Cervical Assessment: Exceptions to [facility] (cervical restrictions/precautions) Thoracic Assessment Thoracic Assessment: Exceptions to [facility] (rounded shoulders) Lumbar Assessment Lumbar Assessment: Exceptions to [facility] (posterior pelvic tilt) Postural Control Postural Control: Deficits on  evaluation  Balance Static Sitting Balance Static Sitting - Balance Support: No upper extremity supported;Feet supported Static Sitting - Level of Assistance: 6: Modified independent (Device/Increase time) Dynamic Sitting Balance Dynamic Sitting - Balance Support: During functional activity Dynamic Sitting - Level of Assistance: 5: Stand by assistance Extremity/Trunk Assessment RUE Assessment RUE Assessment: Exceptions to [facility] Active Range of Motion (AROM) Comments: Shoulder A/ROM - able to initiate shoulder flexion 0-30; elbow flex/ext WFL; active wrist extension against gravity to ~20* however has difficulty sustaining with digit movement; able to achieve gross grasp with exception of index finger. Able to oppose index and middle fingers; sup/pronation limited but functional; digit extension improved however @ 40 degree lag General Strength Comments: 3-/5 shoulder flexion/abduction, 3/5 elbow flexion/extension, 3-/5 wrist flexion/extension. Impaired gross grasp (PM session note for grip strength) LUE Assessment LUE Assessment: Exceptions to [facility] Passive Range of Motion (PROM) Comments: T[place] P/ROM shoulder flexion to 90 degrees Active Range of Motion (AROM) Comments: Very limited shoulder flexion to ~10 degrees, elbow flex/ext ROM WFL,  supination/pronation WFL; increased isolated digit movement; able to oppose index, middle and ring and almost  touching little finger General Strength Comments: shoulder flexion/abduction: 3-/5, wrist flexion/extension: 3/5, wrist extension/flexion: 3/5. Able to achieve functionl gross grasp although impaired (see PM note for grip strength)   Maritza Debby Mare 05/16/2024, 12:23 PM

## 2024-05-16 NOTE — Progress Notes (Signed)
 Physical Therapy Discharge Summary  Patient Details  Name: Jerome Vaughn MRN: [id number] Date of Birth: Oct 19, 1950  Date of Discharge from PT service:May 16, 2024  Today's Date: 05/16/2024 PT Individual Time: 1100-1200 PT Individual Time Calculation (min): 60 min    Patient has met 8 of 8 long term goals due to improved activity tolerance, improved balance, improved postural control, increased strength, increased range of motion, decreased pain, ability to compensate for deficits, and improved coordination.  Patient to discharge at an ambulatory level Supervision.   Patient's care partner is independent to provide the necessary physical assistance at discharge. Pt to d/c home w/ wife who has undergone family education.   Recommendation:  Patient will benefit from ongoing skilled PT services in outpatient setting to continue to advance safe functional mobility, address ongoing impairments in decreased B foot clearance, dynamic standing balance, and minimize fall risk.  Equipment: Rollator, transport chair  Reasons for discharge: treatment goals met and discharge from hospital  Patient/family agrees with progress made and goals achieved: Yes  Skilled Therapeutic Interventions/Progress Updates: Today's session focused on d/c planning and activities.  Pt received upright in recliner, agreeable to therapy and denies pain. Pt performed all transfers w/ rollator and supervision, including STS and stand-sit. Pt asc/desc 10 7 steps in stairwell w/ B handrails and CGA for improved functional activity tolerance. Pt able to grab object off the floor w/ CGA, requiring minA to catch balance after coming back up to standing. Pt ambulated >1,016ft w/ supervision for safety/balance. During gait, pt performed several dual-task activities, including counting backwards by 3's and 7's w/ audible distractors, increased cognitive load with rapid-fire questions. Pt also demonstrated understanding of precautions  and proper guarding technique by providing teach back in a hypothetical situation, teaching friend how to guard him and what to watch out for in terms of s/s.  Pt returned to recliner w/ all needs within reach including call bell.   PT Discharge Precautions/Restrictions Precautions Precautions: Fall;Cervical Recall of Precautions/Restrictions: Intact Required Braces or Orthoses: Cervical Brace Cervical Brace: Soft collar;Other (comment) Other Brace: Can remove when in bed and when showering. May apply/remove when sitting Restrictions Weight Bearing Restrictions Per Provider Order: No Pain Pain Assessment Pain Scale: 0-10 Pain Score: 0-No pain Pain Interference Pain Interference Pain Effect on Sleep: 2. Occasionally;1. Rarely or not at all Pain Interference with Therapy Activities: 0. Does not apply - I have not received rehabilitationtherapy in the past 5 days Pain Interference with [place] Activities: 1. Rarely or not at all Vision/Perception  Vision - History Ability to See in Adequate Light: 0 Adequate Perception Perception: Within Functional Limits Praxis Praxis: WFL  Cognition Overall Cognitive Status: Within Functional Limits for tasks assessed Arousal/Alertness: Awake/alert Orientation Level: Oriented X4 Memory: Appears intact Awareness: Appears intact Problem Solving: Appears intact Safety/Judgment: Appears intact Sensation Sensation Light Touch: Appears Intact Hot/Cold: Appears Intact Proprioception: Appears Intact Stereognosis: Appears Intact Additional Comments: Pt reports bilateral fingertip numbness which is on-going. Coordination Gross Motor Movements are Fluid and Coordinated: Yes Fine Motor Movements are Fluid and Coordinated: No Coordination and Movement Description: Slight decrease in coordination, but believed to be baseline. Finger Nose Finger Test: Impaired BUE 9 Hole Peg Test: L-122.9 secs R: unable to complete Motor  Motor Motor: Within  Functional Limits Motor - Skilled Clinical Observations: Mobility Bed Mobility Bed Mobility: Supine to Sit;Sitting - Scoot to Edge of Bed Supine to Sit: Independent with assistive device Sitting - Scoot to Edge of Bed: Independent with assistive device Transfers  Transfers: Sit to Stand;Stand to [employer] Transfers Sit to Stand: Supervision/Verbal cueing Stand to Sit: Supervision/Verbal cueing Stand Pivot Transfers: Supervision/Verbal cueing Transfer (Assistive device): 4-wheeled walker Locomotion  Gait Ambulation: Yes Gait Assistance: Supervision/Verbal cueing Gait Distance (Feet): 1000 Feet Assistive device: 4-wheeled walker Gait Gait: Yes Gait Pattern: Within Functional Limits Gait Pattern: Step-through pattern;Poor foot clearance - right;Poor foot clearance - left Stairs / Additional Locomotion Stairs: Yes Stairs Assistance: Contact Guard/Touching assist Stair Management Technique: Two rails;Alternating pattern Number of Stairs: 10 Height of Stairs: 7 Ramp: Supervision/Verbal cueing Curb: Contact Guard/Touching assist Pick up small object from the floor assist level: Contact Guard/Touching assist Wheelchair Mobility Wheelchair Mobility: No  Trunk/Postural Assessment  Cervical Assessment Cervical Assessment: Exceptions to [facility] (cervical precautions) Thoracic Assessment Thoracic Assessment: Within Functional Limits Lumbar Assessment Lumbar Assessment: Within Functional Limits Postural Control Postural Control: Within Functional Limits  Balance Balance Balance Assessed: Yes Static Sitting Balance Static Sitting - Balance Support: No upper extremity supported;Feet supported Static Sitting - Level of Assistance: 6: Modified independent (Device/Increase time) Dynamic Sitting Balance Dynamic Sitting - Balance Support: During functional activity Dynamic Sitting - Level of Assistance: 5: Stand by assistance Dynamic Sitting - Balance Activities: Lateral lean/weight  shifting;Reaching for weighted objects;Forward lean/weight shifting;Reaching across midline Static Standing Balance Static Standing - Balance Support: During functional activity Static Standing - Level of Assistance: 5: Stand by assistance Dynamic Standing Balance Dynamic Standing - Balance Support: During functional activity Dynamic Standing - Level of Assistance: 5: Stand by assistance Extremity Assessment  RUE Assessment RUE Assessment: Exceptions to [facility] Active Range of Motion (AROM) Comments: Shoulder A/ROM - able to initiate shoulder flexion 0-30; elbow flex/ext WFL; active wrist extension against gravity to ~20* however has difficulty sustaining with digit movement; able to achieve gross grasp with exception of index finger. Able to oppose index and middle fingers; sup/pronation limited but functional; digit extension improved however @ 40 degree lag General Strength Comments: 3-/5 shoulder flexion/abduction, 3/5 elbow flexion/extension, 3-/5 wrist flexion/extension. Impaired gross grasp (PM session note for grip strength) LUE Assessment LUE Assessment: Exceptions to [facility] Passive Range of Motion (PROM) Comments: [place] P/ROM shoulder flexion to 90 degrees Active Range of Motion (AROM) Comments: Very limited shoulder flexion to ~10 degrees, elbow flex/ext ROM WFL,  supination/pronation WFL; increased isolated digit movement; able to oppose index, middle and ring and almost touching little finger General Strength Comments: shoulder flexion/abduction: 3-/5, wrist flexion/extension: 3/5, wrist extension/flexion: 3/5. Able to achieve functionl gross grasp although impaired (see PM note for grip strength) RLE Assessment General Strength Comments: grossly 5/5, knee flx 4+/5 LLE Assessment General Strength Comments: grossly 5/5, knee flx 4+/5   Jerome Vaughn 05/16/2024, 12:39 PM

## 2024-05-16 NOTE — Progress Notes (Signed)
 Patient ID: Jerome Vaughn, male   DOB: 09-07-1951, 73 y.o.   MRN: [id number]  1238- SW spoke with pt wife about d/c recs- outpatient PT/OT and transport chair. Preferred outpatient location [facility] and will order transport chair.   SW faxed outpatient referral to [facility] and ordered DME with [employer] via parachute.   Graeme Jude, MSW, LCSW Office: [phone] Cell: [phone] Fax: [phone]

## 2024-05-16 NOTE — Progress Notes (Addendum)
 Occupational Therapy Session Note  Patient Details  Name: Jerome Vaughn MRN: [id number] Date of Birth: 1951-06-15  Today's Date: 05/16/2024 OT Individual Time: 1300-1400 OT Individual Time Calculation (min): 60 min    Short Term Goals: Week 3:  OT Short Term Goal 1 (Week 3): STGs = LTGs  Skilled Therapeutic Interventions/Progress Updates:    Skilled OT intervention with focus on BUE strengthening and GMC/FMC to increase independence with BADLS. Pt issued handout for UE coordination activities at home. Pt demonstrated tasks listed on handout. Pt issued tan and yellow Theraputty for strengthing and [facility] tasks. Pt return demonstrated strengthening activities and [facility] tasks with small beads in putty. Reviewed home safety recommendations. Pt returned to room and transferred to recliner. All needs within reach.  Therapy Documentation Precautions:  Precautions Precautions: Fall, Cervical Recall of Precautions/Restrictions: Intact Precaution/Restrictions Comments: B rotator cuff tears Required Braces or Orthoses: Cervical Brace Cervical Brace: Soft collar, Other (comment) Other Brace: Can remove when in bed and when showering. May apply/remove when sitting Restrictions Weight Bearing Restrictions Per Provider Order: No   Pain: Pt reports 3/10 shoulder pain after earlier sessions; meds admin during therapy.   Therapy/Group: Individual Therapy  Maritza Debby Mare 05/16/2024, 3:05 PM

## 2024-05-16 NOTE — Progress Notes (Signed)
 Occupational Therapy Session Note  Patient Details  Name: Jerome Vaughn MRN: [id number] Date of Birth: 05-26-1951  Today's Date: 05/16/2024 OT Individual Time: 9069-8954 OT Individual Time Calculation (min): 75 min    Short Term Goals: Week 3:  OT Short Term Goal 1 (Week 3): STGs = LTGs  Skilled Therapeutic Interventions/Progress Updates:    Pt resting in bed upon arrival. Skilled OT intervention with focus on bed mobility, sitting balance, sit<>stand, standing balance, bathing/dressing, toileting, FMC, and safety awareness to increase independence with BADLs and prepare for d/c home tomorrow. Supine>sit EOB with supervision. Sitting balance with supervision. Pt reports he is not using U-cuff or wrist brace. Pt donned pants with CGA but required max A for donning shoes while seated EOB. Sit<>stand from EOB with supervision. Standing balance at EOB and at sink with supervision. Pt completed grooming tasks standing at sink. Pt requrested to use urinal and completed toielting task with supervision. FMC tasks/assessment in gym.  9 Hole Peg Test is used to measure finger dexterity in pts with various neurological diagnoses. - Instructions The pt was instructed to pick up the pegs one at a time, using their dominant hand first and put them into the holes in any order until the holes were all filled. The pt then removed the pegs one at a time and returned them to the container. Both hands were tested separately.  - Results LUE-The pt completed the test in *125.9 seconds. Pt unable to complete task with RUE.Scores are based on the time taken to complete the activity. The timer started the moment the pt touched the first peg until the moment the last peg hit the container.  - Norms for healthy males ages 69-70+ 64-55 R 18.9 L 19.84 56-60 R 20.90 L 21.64 61-65 R 20.87 L 21.60 66-70 R 21.23 L 22.29 71+ R 25.79 L 25.95  The Dynamometer Grip Strength Test is a quantitative and  objective measure of isometric muscular strength of the hand and forearm.  -Instructions The patient was asked to sit with their back, pelvis, and knees at 90 degrees. The shoulder was adducted and neutrally rotated with The elbow flexed to 90 degrees and forearm in neutral. The arm was not supported.  -Results The score was determined by calculating the average of 3 trials. The pt's average score was * 20 lbs in the R hand and * 39 lbs in the L hand.  -Norms Males Average in lbs 55-59 R 101.1 L 83.2 60-64 R 89.7 L 76.8 65-69 R 91.1 L 76.8 70-74 R 75.3 L 64.8 75+ R 65.7 L 55.0  Male Average in lbs 55-59 R 57.3 L 47.3 60-64 R 55.1 L 45.7 65-69 R 49.6 L 41.0 70-74 R 49.6 L 41.5 75+ R 42.6 L 37.6   Pt returned to room and transferred to recliner. Pt remained in recliner with all needs within reach.     Therapy Documentation Precautions:  Precautions Precautions: Fall Recall of Precautions/Restrictions: Intact Precaution/Restrictions Comments: B rotator cuff tears Required Braces or Orthoses: Cervical Brace Cervical Brace: Soft collar, Other (comment) Other Brace: Can remove when in bed and when showering. May apply/remove when sitting Restrictions Weight Bearing Restrictions Per Provider Order: No   Pain: Pt reports minor discomfort this morning but not c/o pain; rest as required   Therapy/Group: Individual Therapy  Maritza Debby Mare 05/16/2024, 10:49 AM
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
## 2024-05-16 NOTE — Plan of Care (Signed)
  Problem: Consults Goal: RH SPINAL CORD INJURY PATIENT EDUCATION Description:  See Patient Education module for education specifics.  Outcome: Progressing   Problem: SCI BOWEL ELIMINATION Goal: RH STG MANAGE BOWEL WITH ASSISTANCE Description: STG Manage Bowel with supervision-min Assistance. Outcome: Progressing   Problem: SCI BLADDER ELIMINATION Goal: RH STG MANAGE BLADDER WITH ASSISTANCE Description: STG Manage Bladder With supervision-min Assistance Outcome: Progressing   Problem: RH SKIN INTEGRITY Goal: RH STG SKIN FREE OF INFECTION/BREAKDOWN Description: Manage skin free of infection with supervision - min assistance Outcome: Progressing   Problem: RH SAFETY Goal: RH STG ADHERE TO SAFETY PRECAUTIONS W/ASSISTANCE/DEVICE Description: STG Adhere to Safety Precautions With supervision- min Assistance/Device. Outcome: Progressing   Problem: RH PAIN MANAGEMENT Goal: RH STG PAIN MANAGED AT OR BELOW PT'S PAIN GOAL Description: <4 w/ prns Outcome: Progressing   Problem: RH KNOWLEDGE DEFICIT SCI Goal: RH STG INCREASE KNOWLEDGE OF SELF CARE AFTER SCI Description: Manage increase knowledge of self care after SCI with supervision- min assistance from wife using educational materials provided Outcome: Progressing

## 2024-05-16 NOTE — Progress Notes (Signed)
 Inpatient Rehabilitation Discharge Medication Review by a Pharmacist  A complete drug regimen review was completed for this patient to identify any potential clinically significant medication issues.  High Risk Drug Classes Is patient taking? Indication by Medication  Antipsychotic No   Anticoagulant Yes Apixaban  - VTE ppx x4 weeks at DC  Antibiotic No   Opioid No   Antiplatelet Yes Tramadol - PRN pain  Hypoglycemics/insulin  Yes Metformin , ozempic  - DM  Vasoactive Medication Yes Fludrocortisone , midodrine , atomoxetine  - orthostatic hypotension  Chemotherapy No   Other Yes Baclofen , methocarbamol  - muscle spasms Docusate, Bisocodyl, Miralax  - constipation Lidocaine  patch,Diclofenac  gel - topical pain relief Gabapentin  - neuropathy Tamsulosin  - urinary retention Ezetimibe , rosuvastatin  - HLD Pantoprazole  - GERD Ranolazine , aspirin  - CAD MVI, PO iron , fish oil, Vitamin B, Vitamin C, Vitamin E - supplement     Type of Medication Issue Identified Description of Issue Recommendation(s)  Drug Interaction(s) (clinically significant)     Duplicate Therapy     Allergy     No Medication Administration End Date     Incorrect Dose     Additional Drug Therapy Needed     Significant med changes from prior encounter (inform family/care partners about these prior to discharge). Many supplements stopped at DC  Carvedilol , meloxicam , repaglinide , sildenafil , tadalafil  stopped at DC Communicate relevant medication changes to patient/family members at discharge from CIR.   Other       Clinically significant medication issues were identified that warrant physician communication and completion of prescribed/recommended actions by midnight of the next [place]:  No   Pharmacist comments:  - apixaban  - VTE ppx x4 weeks at DC  Time spent performing this drug regimen review (minutes): 20   Thank you for allowing pharmacy to be a part of this patient's care.  Shelba Collier, PharmD,  BCPS Clinical Pharmacist

## 2024-05-17 LAB — GLUCOSE, CAPILLARY
Glucose-Capillary: 156 mg/dL — ABNORMAL HIGH (ref 70–99)
Glucose-Capillary: 219 mg/dL — ABNORMAL HIGH (ref 70–99)
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
## 2024-05-17 NOTE — Progress Notes (Signed)
 Per MD patient is voiding. MD recommends to send just just a few in and out catheters for home.  Full bag of EZ gripper caths given  to patient for home just in case he needs to cath himself. Per patient OT had been working with him on strategies of using the catheter. Patient  had watched the video on how to use the catheter, the rep for EZ cath had showed him how to use the in and out  catheter  and staff showed him as well.

## 2024-05-17 NOTE — Progress Notes (Signed)
 Inpatient Rehabilitation Care Coordinator Discharge Note   Patient Details  Name: Jerome Vaughn MRN: [id number] Date of Birth: [birth year]   Discharge location: D/c to home  Length of Stay: 35 days  Discharge activity level: Supervision  Home/community participation: Limited  Patient response un:Yzjouy Literacy - How often do you need to have someone help you when you read instructions, pamphlets, or other written material from your doctor or pharmacy?: Rarely  Patient response un:Dnrpjo Isolation - How often do you feel lonely or isolated from those around you?: Rarely  Services provided included: MD, RD, PT, OT, SLP, RN, CM, TR, Pharmacy, Neuropsych, SW  Financial Services:  Field seismologist Utilized: Private Insurance [employer]  Choices offered to/list presented to: patient and wife  Follow-up services arranged:  Outpatient, DME    Outpatient Servicies: [facility] PT/OT DME : [facility] for transport chair    Patient response to transportation need: Is the patient able to respond to transportation needs?: Yes In the past 12 months, has lack of transportation kept you from medical appointments or from getting medications?: No In the past 12 months, has lack of transportation kept you from meetings, work, or from getting things needed for daily living?: No   Patient/Family verbalized understanding of follow-up arrangements:  Yes  Individual responsible for coordination of the follow-up plan: contact pt or pt wife  Confirmed correct DME delivered: Graeme DELENA Jude 05/17/2024    Comments (or additional information):fam edu  Summary of Stay    Date/Time Discharge Planning CSW  05/09/24 1541 Pt will d/c to home with his wife who will provide 24/7 care. Pt needs ot be as independent as possible since wife is not able to provide any physical assistance (i.e lifting). Fam conference held last Thursday to review care needs. Fam edu on tuesday 1pm-4pm. SW  will confirm there are no barriers to discharge. AAC  05/03/24 0955 Pt will d/c to home with his wife who will provide 24/7 care. Pt needs ot be as independent as possible since wife is not able to provide any physical assistance (i.e lifting). Fam conference held last Thursday to review care needs. SW will confirm there are no barriers to discharge. AAC  04/25/24 1338 Pt will d/c to home with his wife who will provide 24/7 care. Pt needs ot be as independent as possible since wife is not able to provide any physical assistance (i.e lifting). SW will confirm there are no barriers to discharge. AAC       Leyton Brownlee A Jude

## 2024-05-18 NOTE — Progress Notes (Signed)
 Recreational Therapy Discharge Summary Patient Details  Name: Jerome Vaughn MRN: [id number] Date of Birth: 07/21/1951 Today's Date: 05/18/2024  Comments on progress toward goals: Pt made great progress during LOS and discharged home yesterday with wife at overall supervision ambulatory level. TR sessions included leisure education including activity analysis/modification, coping/stress management and community reintegration.  Pt was scheduled to participate in an outing prior to discharge but this was cancelled due to unexpected absence of LRT/CTRS.  Pt is motivated and optimistic in returning to previously enjoyed activities as able.  Reasons for discharge: discharge from hospital  Follow-up: Outpatient  Patient/family agrees with progress made and goals achieved: Yes  Mikeila Burgen 05/18/2024, 10:07 AM

## 2024-05-26 ENCOUNTER — Other Ambulatory Visit: Payer: Self-pay | Admitting: Internal Medicine

## 2024-05-27 ENCOUNTER — Encounter: Payer: Self-pay | Admitting: Physical Medicine and Rehabilitation

## 2024-05-27 ENCOUNTER — Encounter: Attending: Physical Medicine and Rehabilitation | Admitting: Physical Medicine and Rehabilitation

## 2024-05-27 ENCOUNTER — Encounter: Payer: Self-pay | Admitting: Cardiovascular Disease

## 2024-05-27 VITALS — BP 165/89 | HR 68 | Ht 70.0 in | Wt 177.6 lb

## 2024-05-27 DIAGNOSIS — I951 Orthostatic hypotension: Secondary | ICD-10-CM | POA: Diagnosis not present

## 2024-05-27 DIAGNOSIS — Z951 Presence of aortocoronary bypass graft: Secondary | ICD-10-CM | POA: Insufficient documentation

## 2024-05-27 DIAGNOSIS — Z981 Arthrodesis status: Secondary | ICD-10-CM | POA: Insufficient documentation

## 2024-05-27 DIAGNOSIS — I110 Hypertensive heart disease with heart failure: Secondary | ICD-10-CM | POA: Insufficient documentation

## 2024-05-27 DIAGNOSIS — I5042 Chronic combined systolic (congestive) and diastolic (congestive) heart failure: Secondary | ICD-10-CM | POA: Insufficient documentation

## 2024-05-27 DIAGNOSIS — Z79899 Other long term (current) drug therapy: Secondary | ICD-10-CM | POA: Diagnosis not present

## 2024-05-27 DIAGNOSIS — Z7901 Long term (current) use of anticoagulants: Secondary | ICD-10-CM | POA: Diagnosis not present

## 2024-05-27 DIAGNOSIS — E1140 Type 2 diabetes mellitus with diabetic neuropathy, unspecified: Secondary | ICD-10-CM | POA: Insufficient documentation

## 2024-05-27 DIAGNOSIS — G8250 Quadriplegia, unspecified: Secondary | ICD-10-CM | POA: Diagnosis not present

## 2024-05-27 DIAGNOSIS — K592 Neurogenic bowel, not elsewhere classified: Secondary | ICD-10-CM | POA: Diagnosis not present

## 2024-05-27 DIAGNOSIS — S14129D Central cord syndrome at unspecified level of cervical spinal cord, subsequent encounter: Secondary | ICD-10-CM | POA: Insufficient documentation

## 2024-05-27 DIAGNOSIS — I2510 Atherosclerotic heart disease of native coronary artery without angina pectoris: Secondary | ICD-10-CM | POA: Diagnosis not present

## 2024-05-27 DIAGNOSIS — W19XXXA Unspecified fall, initial encounter: Secondary | ICD-10-CM

## 2024-05-27 DIAGNOSIS — N319 Neuromuscular dysfunction of bladder, unspecified: Secondary | ICD-10-CM | POA: Insufficient documentation

## 2024-05-27 DIAGNOSIS — R252 Cramp and spasm: Secondary | ICD-10-CM | POA: Diagnosis not present

## 2024-05-27 DIAGNOSIS — W1839XA Other fall on same level, initial encounter: Secondary | ICD-10-CM | POA: Diagnosis not present

## 2024-05-27 MED ORDER — GABAPENTIN 300 MG PO CAPS: 300.0000 mg | ORAL_CAPSULE | Freq: Three times a day (TID) | ORAL | 1 refills | Status: DC | PRN

## 2024-05-27 MED ORDER — BACLOFEN 5 MG PO TABS: 5.0000 mg | ORAL_TABLET | Freq: Three times a day (TID) | ORAL | 1 refills | Status: AC

## 2024-05-27 MED ORDER — TAMSULOSIN HCL 0.4 MG PO CAPS: 0.4000 mg | ORAL_CAPSULE | Freq: Every day | ORAL | 1 refills | Status: DC

## 2024-05-27 MED ORDER — FLUDROCORTISONE ACETATE 0.1 MG PO TABS: 0.1000 mg | ORAL_TABLET | Freq: Every day | ORAL | 1 refills | Status: AC

## 2024-05-27 MED ORDER — MIDODRINE HCL 10 MG PO TABS: 10.0000 mg | ORAL_TABLET | Freq: Three times a day (TID) | ORAL | 1 refills | Status: DC

## 2024-05-27 MED ORDER — METHOCARBAMOL 500 MG PO TABS: 1000.0000 mg | ORAL_TABLET | Freq: Three times a day (TID) | ORAL | 1 refills | Status: DC | PRN

## 2024-05-27 MED ORDER — ATOMOXETINE HCL 25 MG PO CAPS: 50.0000 mg | ORAL_CAPSULE | Freq: Every day | ORAL | 1 refills | Status: DC
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
## 2024-05-31 NOTE — Telephone Encounter (Signed)
 Can we please get him first available APP visit and then f/u with me before the end of the year? Dr. JAYSON

## 2024-06-01 ENCOUNTER — Encounter: Payer: Self-pay | Admitting: Family Medicine

## 2024-06-01 ENCOUNTER — Ambulatory Visit: Admitting: Family Medicine

## 2024-06-01 VITALS — BP 118/82 | HR 72 | Temp 97.6°F | Wt 179.0 lb

## 2024-06-01 DIAGNOSIS — E1142 Type 2 diabetes mellitus with diabetic polyneuropathy: Secondary | ICD-10-CM | POA: Diagnosis not present

## 2024-06-01 DIAGNOSIS — G8250 Quadriplegia, unspecified: Secondary | ICD-10-CM | POA: Diagnosis not present

## 2024-06-01 DIAGNOSIS — H1132 Conjunctival hemorrhage, left eye: Secondary | ICD-10-CM | POA: Diagnosis not present

## 2024-06-01 DIAGNOSIS — I5042 Chronic combined systolic (congestive) and diastolic (congestive) heart failure: Secondary | ICD-10-CM

## 2024-06-01 DIAGNOSIS — I951 Orthostatic hypotension: Secondary | ICD-10-CM

## 2024-06-01 DIAGNOSIS — I10 Essential (primary) hypertension: Secondary | ICD-10-CM | POA: Diagnosis not present

## 2024-06-01 DIAGNOSIS — S0083XD Contusion of other part of head, subsequent encounter: Secondary | ICD-10-CM
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
## 2024-06-02 ENCOUNTER — Telehealth: Payer: Self-pay

## 2024-06-02 NOTE — Telephone Encounter (Signed)
(  Key: [id number]) PA Case ID #: [id number] Rx #: [id number] Status sent iconSent to Plan today Drug Baclofen  5MG  tablets ePA cloud logo Form [employer] Electronic PA For

## 2024-06-03 NOTE — Telephone Encounter (Signed)
 Approved on September 4 by [facility] NCPDP 2017 PA Case: [id number], Status: Approved, Coverage Starts on: 09/30/2023 12:00:00 AM, Coverage Ends on: 09/28/2024 12:00:00 AM. Questions? Contact [phone]. Effective Date: 09/30/2023

## 2024-06-06 ENCOUNTER — Other Ambulatory Visit: Payer: Self-pay

## 2024-06-06 MED ORDER — APIXABAN 2.5 MG PO TABS: 2.5000 mg | ORAL_TABLET | Freq: Two times a day (BID) | ORAL | 0 refills | Status: DC

## 2024-06-06 MED ORDER — REPAGLINIDE 0.5 MG PO TABS: 0.5000 mg | ORAL_TABLET | Freq: Two times a day (BID) | ORAL | 3 refills | Status: DC

## 2024-06-06 MED ORDER — RANOLAZINE ER 500 MG PO TB12: 500.0000 mg | ORAL_TABLET | Freq: Two times a day (BID) | ORAL | 0 refills | Status: DC

## 2024-06-06 MED ORDER — METHOCARBAMOL 500 MG PO TABS: 1000.0000 mg | ORAL_TABLET | Freq: Three times a day (TID) | ORAL | 1 refills | Status: AC | PRN

## 2024-06-10 ENCOUNTER — Ambulatory Visit: Admitting: Physical Therapy

## 2024-06-10 ENCOUNTER — Encounter (HOSPITAL_COMMUNITY): Payer: Self-pay

## 2024-06-10 ENCOUNTER — Encounter: Admitting: Occupational Therapy

## 2024-06-10 DIAGNOSIS — M4322 Fusion of spine, cervical region: Secondary | ICD-10-CM | POA: Diagnosis not present

## 2024-06-10 DIAGNOSIS — Z6827 Body mass index (BMI) 27.0-27.9, adult: Secondary | ICD-10-CM | POA: Diagnosis not present

## 2024-06-13 NOTE — Progress Notes (Unsigned)
 Cardiology Office Note   Date:  06/14/2024  ID:  Jerome Vaughn, DOB 20-Jan-1951, MRN [id number] PCP: [name] LABOR, MD  [facility] Providers Cardiologist:  [name] [name] DOUGLAS, MD (Inactive)   History of Present Illness Jerome Vaughn is a 73 y.o. male with a past medical history of coronary artery disease (CABG x 4 in 2017, known occlusion of SVG to diagonal, patent LIMA to LAD, SVG to OM, SVG to RCA by cath 2020), left ventricular systolic dysfunction (most recent LVEF 35 to 45% by angiography in 2020), type 2 diabetes mellitus, hypercholesteremia, hypertension, bilateral carpal tunnel syndrome here for follow-up appointment.  Was seen in the office back in March.  At that time he was very active.  Had planned to retire but then changed his mind and still operates a [employer].  Sometimes has to push heavy weights and does not have any issues with chest pain or shortness of breath doing this.  Occasionally describes twinges in his chest that are quite brief lasting about 15 seconds but somewhat reminiscent of his angina which was precordial tightness.  He denied exertional dyspnea, orthopnea, PND, lower extremity edema or focal neurologic complaints.  Has not had palpitations or syncope.  Continues to occasionally take PDE 5 inhibitors for erectile dysfunction.  He is aware that these medications should never be mixed with nitrates.  Has not had an updated lipid panel since January 2024 but was scheduled for a physical with Chiquita Cramp on May 13 he will have his lipid panel checked at that time.  In 2024 his HDL was 32, LDL was 53, triglycerides mildly elevated at 219.  More recently, hemoglobin A1c was 6.4 back in the fall.  This is a big improvement.  He was taking metformin , Jardiance  10 mg daily, Ozempic  and Prandin .  Borderline creatinine of 1.13.  He does have problems of bilateral carpal tunnel syndrome and there is a diagnosis of monoclonal gammopathy of uncertain significance.  Carpal  tunnel syndrome began about 15 years ago suggesting that they were probably not due to amyloidosis.  To date, no cardiac manifestations to suggest amyloidosis.  Endocrinologist and hematologist both following his care.  Today, he presents with a hx of coronary artery disease  for cardiovascular evaluation following a recent neck injury.  He experienced fluctuating blood pressure after the spinal injury and is currently taking midodrine  and Strattera . During his hospital stay, he had an episode of chest tightness, but an EKG showed no significant changes. He denies current chest pain, shortness of breath, palpitations, or arrhythmias.  His coronary artery disease was previously managed with coronary artery bypass grafting. His diabetes is well-controlled with an A1c of 6.4. Cholesterol levels are managed with an LDL of 63 and triglycerides at 68. Kidney function and potassium levels were normal as of August.  Reports no shortness of breath nor dyspnea on exertion. Reports no chest pain, pressure, or tightness. No edema, orthopnea, PND. Reports no palpitations.   Discussed the use of AI scribe software for clinical note transcription with the patient, who gave verbal consent to proceed.   ROS: Pertinent ROS in HPI  Studies Reviewed      Cardiac catheterization 12/14/2018    Ost LM to Dist LM lesion is 65% stenosed. FFR of this lesion into the distal circumflex was negative, 0.95. Prox LAD lesion is 85% stenosed. LIMA to LAD is patent. 1st Diag lesion is 100% stenosed. SVG to diagonal is occluded. Ost 2nd Mrg to 2nd Mrg  lesion is 65% stenosed. Jump graft from OM1 to OM2 is occluded. Ost 1st Mrg lesion is 75% stenosed. SVG to OM1 portion is patent. Mid RCA lesion is 100% stenosed. SVG to PDA is patent. Ostial PDA disease is unchanged from prior. Ost RPDA to RPDA lesion is 95% stenosed. The left ventricular ejection fraction is 35-45% by visual estimate. There is mild to moderate left  ventricular systolic dysfunction. LV end diastolic pressure is normal.   Continue medical therapy for small vessel disease and for LV dysfunction.    Diagnostic Dominance: Right    Echocardiogram 12/13/2018      1. The left ventricle has mildly reduced systolic function, with an  ejection fraction of 45-50%. The cavity size was normal. Left ventricular  diastolic Doppler parameters are indeterminate. There is abnormal septal  motion consistent with post-operative  status. Left ventricular diffuse hypokinesis.   2. The right ventricle has normal systolic function. The cavity was  normal. There is no increase in right ventricular wall thickness.   3. Left atrial size was mildly dilated.   4. There is mild mitral annular calcification present.   5. The aortic valve is tricuspid Mild thickening of the aortic valve Mild  calcification of the aortic valve.    Diastology        LV e' lateral:   10.30 cm/s        LV E/e' lateral: 10.0        LV e' medial:    7.89 cm/s        LV E/e' medial:  13.1    MV Decel Time: 204 msec  MV E velocity: 103.00 cm/s SHUNTS  MV A velocity: 54.10 cm/s  Systemic Diam: 1.70 cm  MV E/A ratio:  1.90       Physical Exam VS:  BP 98/68   Pulse 80   Ht 5' 10 (1.778 m)   Wt 173 lb 9.6 oz (78.7 kg)   SpO2 97%   BMI 24.91 kg/m        Wt Readings from Last 3 Encounters:  06/14/24 173 lb 9.6 oz (78.7 kg)  06/01/24 179 lb (81.2 kg)  05/27/24 177 lb 9.6 oz (80.6 kg)    GEN: Well nourished, well developed in no acute distress NECK: No JVD; No carotid bruits CARDIAC: RRR, no murmurs, rubs, gallops RESPIRATORY:  Clear to auscultation without rales, wheezing or rhonchi  ABDOMEN: Soft, non-tender, non-distended EXTREMITIES:  No edema; No deformity   ASSESSMENT AND PLAN  Chronic combined systolic and diastolic heart failure Chronic heart failure with CABG history. Recent hospitalization for spinal injury with fluctuating blood pressure. No recent  echocardiogram since 2020, previous LVEF was 45-50%. No current symptoms of chest pain, shortness of breath, or peripheral edema. Right hand swelling attributed to spinal injury rather than heart failure. - Order echocardiogram to evaluate heart pump function and valve status.  Atherosclerosis of coronary artery bypass graft(s) with angina CABG with previous angina. One episode of chest tightness during recent hospitalization, evaluated with EKG showing no ST changes. No current angina or ischemic symptoms. Previous EKG in March similar to July's results. - Consider ischemic workup if echocardiogram shows significant drop in heart function.  Chronic kidney disease, stage 2 Chronic kidney disease stage 2 with well-managed renal function. Recent labs in August showed normal potassium and platelet levels.  Type 2 diabetes mellitus Type 2 diabetes well-controlled with recent A1c of 6.4%.  General Health Maintenance Recent lipid panel showed LDL at 63 and  triglycerides at 68, indicating good cholesterol control. - Schedule echocardiogram in the afternoon as per his preference.  Monoclonal gammopathy (MGUS) -not addressed today     Dispo: He can follow-up in 6 months with Dr. Kriste  Signed, Orren LOISE Fabry, PA-C

## 2024-06-14 ENCOUNTER — Encounter: Payer: Self-pay | Admitting: Physician Assistant

## 2024-06-14 ENCOUNTER — Ambulatory Visit: Attending: Physician Assistant | Admitting: Physician Assistant

## 2024-06-14 ENCOUNTER — Ambulatory Visit: Admitting: Podiatry

## 2024-06-14 VITALS — BP 98/68 | HR 80 | Ht 70.0 in | Wt 173.6 lb

## 2024-06-14 DIAGNOSIS — E7800 Pure hypercholesterolemia, unspecified: Secondary | ICD-10-CM

## 2024-06-14 DIAGNOSIS — N182 Chronic kidney disease, stage 2 (mild): Secondary | ICD-10-CM | POA: Diagnosis not present

## 2024-06-14 DIAGNOSIS — E118 Type 2 diabetes mellitus with unspecified complications: Secondary | ICD-10-CM | POA: Diagnosis not present

## 2024-06-14 DIAGNOSIS — I5042 Chronic combined systolic (congestive) and diastolic (congestive) heart failure: Secondary | ICD-10-CM

## 2024-06-14 DIAGNOSIS — D472 Monoclonal gammopathy: Secondary | ICD-10-CM | POA: Diagnosis not present

## 2024-06-14 DIAGNOSIS — N521 Erectile dysfunction due to diseases classified elsewhere: Secondary | ICD-10-CM | POA: Diagnosis not present

## 2024-06-14 DIAGNOSIS — I10 Essential (primary) hypertension: Secondary | ICD-10-CM

## 2024-06-14 DIAGNOSIS — I25708 Atherosclerosis of coronary artery bypass graft(s), unspecified, with other forms of angina pectoris: Secondary | ICD-10-CM | POA: Diagnosis not present

## 2024-06-14 NOTE — Patient Instructions (Addendum)
 Medication Instructions:   Your physician recommends that you continue on your current medications as directed. Please refer to the Current Medication list given to you today.  *If you need a refill on your cardiac medications before your next appointment, please call your pharmacy*   Lab Work: NONE ORDERED  TODAY    If you have labs (blood work) drawn today and your tests are completely normal, you will receive your results only by: MyChart Message (if you have MyChart) OR A paper copy in the mail If you have any lab test that is abnormal or we need to change your treatment, we will call you to review the results.    Testing/Procedures:  Your physician has requested that you have an echocardiogram. Echocardiography is a painless test that uses sound waves to create images of your heart. It provides your doctor with information about the size and shape of your heart and how well your heart's chambers and valves are working. This procedure takes approximately one hour. There are no restrictions for this procedure. Please do NOT wear cologne, perfume, aftershave, or lotions (deodorant is allowed). Please arrive 15 minutes prior to your appointment time.  Please note: We ask at that you not bring children with you during ultrasound (echo/ vascular) testing. Due to room size and safety concerns, children are not allowed in the ultrasound rooms during exams. Our front office staff cannot provide observation of children in our lobby area while testing is being conducted. An adult accompanying a patient to their appointment will only be allowed in the ultrasound room at the discretion of the ultrasound technician under special circumstances. We apologize for any inconvenience.     Follow-Up: At [facility], you and your health needs are our priority.  As part of our continuing mission to provide you with exceptional heart care, our providers are all part of one team.  This team  includes your primary Cardiologist (physician) and Advanced Practice Providers or APPs (Physician Assistants and Nurse Practitioners) who all work together to provide you with the care you need, when you need it.  Your next appointment:   6 month(s)  Provider:  Dr. Kriste   We recommend signing up for the patient portal called MyChart.  Sign up information is provided on this After Visit Summary.  MyChart is used to connect with patients for Virtual Visits (Telemedicine).  Patients are able to view lab/test results, encounter notes, upcoming appointments, etc.  Non-urgent messages can be sent to your provider as well.   To learn more about what you can do with MyChart, go to [email or web address].   Other Instructions      Low-Sodium Eating Plan Salt (sodium) helps you keep a healthy balance of fluids in your body. Too much sodium can raise your blood pressure. It can also cause fluid and waste to be held in your body. Your health care provider or dietitian may recommend a low-sodium eating plan if you have high blood pressure (hypertension), kidney disease, liver disease, or heart failure. Eating less sodium can help lower your blood pressure and reduce swelling. It can also protect your heart, liver, and kidneys. What are tips for following this plan? Reading food labels  Check food labels for the amount of sodium per serving. If you eat more than one serving, you must multiply the listed amount by the number of servings. Choose foods with less than 140 milligrams (mg) of sodium per serving. Avoid foods with 300 mg of sodium or more  per serving. Always check how much sodium is in a product, even if the label says unsalted or no salt added. Shopping  Buy products labeled as low-sodium or no salt added. Buy fresh foods. Avoid canned foods and pre-made or frozen meals. Avoid canned, cured, or processed meats. Buy breads that have less than 80 mg of sodium per  slice. Cooking  Eat more home-cooked food. Try to eat less restaurant, buffet, and fast food. Try not to add salt when you cook. Use salt-free seasonings or herbs instead of table salt or sea salt. Check with your provider or pharmacist before using salt substitutes. Cook with plant-based oils, such as canola, sunflower, or olive oil. Meal planning When eating at a restaurant, ask if your food can be made with less salt or no salt. Avoid dishes labeled as brined, pickled, cured, or smoked. Avoid dishes made with soy sauce, miso, or teriyaki sauce. Avoid foods that have monosodium glutamate (MSG) in them. MSG may be added to some restaurant food, sauces, soups, bouillon, and canned foods. Make meals that can be grilled, baked, poached, roasted, or steamed. These are often made with less sodium. General information Try to limit your sodium intake to 1,500-2,300 mg each R[place], or the amount told by your provider. What foods should I eat? Fruits Fresh, frozen, or canned fruit. Fruit juice. Vegetables Fresh or frozen vegetables. No salt added canned vegetables. No salt added tomato sauce and paste. Low-sodium or reduced-sodium tomato and vegetable juice. Grains Low-sodium cereals, such as oats, puffed wheat and rice, and shredded wheat. Low-sodium crackers. Unsalted rice. Unsalted pasta. Low-sodium bread. Whole grain breads and whole grain pasta. Meats and other proteins Fresh or frozen meat, poultry, seafood, and fish. These should have no added salt. Low-sodium canned tuna and salmon. Unsalted nuts. Dried peas, beans, and lentils without added salt. Unsalted canned beans. Eggs. Unsalted nut butters. Dairy Milk. Soy milk. Cheese that is naturally low in sodium, such as ricotta cheese, fresh mozzarella, or Swiss cheese. Low-sodium or reduced-sodium cheese. Cream cheese. Yogurt. Seasonings and condiments Fresh and dried herbs and spices. Salt-free seasonings. Low-sodium mustard and ketchup.  Sodium-free salad dressing. Sodium-free light mayonnaise. Fresh or refrigerated horseradish. Lemon juice. Vinegar. Other foods Homemade, reduced-sodium, or low-sodium soups. Unsalted popcorn and pretzels. Low-salt or salt-free chips. The items listed above may not be all the foods and drinks you can have. Talk to a dietitian to learn more. What foods should I avoid? Vegetables Sauerkraut, pickled vegetables, and relishes. Olives. [place] fries. Onion rings. Regular canned vegetables, except low-sodium or reduced-sodium items. Regular canned tomato sauce and paste. Regular tomato and vegetable juice. Frozen vegetables in sauces. Grains Instant hot cereals. Bread stuffing, pancake, and biscuit mixes. Croutons. Seasoned rice or pasta mixes. Noodle soup cups. Boxed or frozen macaroni and cheese. Regular salted crackers. Self-rising flour. Meats and other proteins Meat or fish that is salted, canned, smoked, spiced, or pickled. Precooked or cured meat, such as sausages or meat loaves. [place]. Ham. Pepperoni. Hot dogs. Corned beef. Chipped beef. Salt pork. Jerky. Pickled herring, anchovies, and sardines. Regular canned tuna. Salted nuts. Dairy Processed cheese and cheese spreads. Hard cheeses. Cheese curds. Blue cheese. Feta cheese. String cheese. Regular cottage cheese. Buttermilk. Canned milk. Fats and oils Salted butter. Regular margarine. Ghee. Bacon fat. Seasonings and condiments Onion salt, garlic salt, seasoned salt, table salt, and sea salt. Canned and packaged gravies. Worcestershire sauce. Tartar sauce. Barbecue sauce. Teriyaki sauce. Soy sauce, including reduced-sodium soy sauce. Steak sauce. Fish sauce.  Oyster sauce. Cocktail sauce. Horseradish that you find on the shelf. Regular ketchup and mustard. Meat flavorings and tenderizers. Bouillon cubes. Hot sauce. Pre-made or packaged marinades. Pre-made or packaged taco seasonings. Relishes. Regular salad dressings. Salsa. Other foods Salted  popcorn and pretzels. Corn chips and puffs. Potato and tortilla chips. Canned or dried soups. Pizza. Frozen entrees and pot pies. The items listed above may not be all the foods and drinks you should avoid. Talk to a dietitian to learn more. This information is not intended to replace advice given to you by your health care provider. Make sure you discuss any questions you have with your health care provider. Document Revised: 10/02/2022 Document Reviewed: 10/02/2022 Elsevier Patient Education  2024 Elsevier Inc.    Heart-Healthy Eating Plan Eating a healthy diet is important for the health of your heart. A heart-healthy eating plan includes: Eating less unhealthy fats. Eating more healthy fats. Eating less salt in your food. Salt is also called sodium. Making other changes in your diet. Talk with your doctor or a diet specialist (dietitian) to create an eating plan that is right for you. What is my plan? Your doctor may recommend an eating plan that includes: Total fat: ______% or less of total calories a Dani. Saturated fat: ______% or less of total calories a Hashman. Cholesterol: less than _________mg a Surman. Sodium: less than _________mg a Gillies. What are tips for following this plan? Cooking Avoid frying your food. Try to bake, boil, grill, or broil it instead. You can also reduce fat by: Removing the skin from poultry. Removing all visible fats from meats. Steaming vegetables in water or broth. Meal planning  At meals, divide your plate into four equal parts: Fill one-half of your plate with vegetables and green salads. Fill one-fourth of your plate with whole grains. Fill one-fourth of your plate with lean protein foods. Eat 2-4 cups of vegetables per Ol[place]. One cup of vegetables is: 1 cup (91 g) broccoli or cauliflower florets. 2 medium carrots. 1 large bell pepper. 1 large sweet potato. 1 large tomato. 1 medium white potato. 2 cups (150 g) raw leafy greens. Eat 1-2 cups of  fruit per Mas. One cup of fruit is: 1 small apple 1 large banana 1 cup (237 g) mixed fruit, 1 large orange,  cup (82 g) dried fruit, 1 cup (240 mL) 100% fruit juice. Eat more foods that have soluble fiber. These are apples, broccoli, carrots, beans, peas, and barley. Try to get 20-30 g of fiber per Kadlec. Eat 4-5 servings of nuts, legumes, and seeds per week: 1 serving of dried beans or legumes equals  cup (90 g) cooked. 1 serving of nuts is  oz (12 almonds, 24 pistachios, or 7 walnut halves). 1 serving of seeds equals  oz (8 g). General information Eat more home-cooked food. Eat less restaurant, buffet, and fast food. Limit or avoid alcohol. Limit foods that are high in starch and sugar. Avoid fried foods. Lose weight if you are overweight. Keep track of how much salt (sodium) you eat. This is important if you have high blood pressure. Ask your doctor to tell you more about this. Try to add vegetarian meals each week. Fats Choose healthy fats. These include olive oil and canola oil, flaxseeds, walnuts, almonds, and seeds. Eat more omega-3 fats. These include salmon, mackerel, sardines, tuna, flaxseed oil, and ground flaxseeds. Try to eat fish at least 2 times each week. Check food labels. Avoid foods with trans fats or high amounts  of saturated fat. Limit saturated fats. These are often found in animal products, such as meats, butter, and cream. These are also found in plant foods, such as palm oil, palm kernel oil, and coconut oil. Avoid foods with partially hydrogenated oils in them. These have trans fats. Examples are stick margarine, some tub margarines, cookies, crackers, and other baked goods. What foods should I eat? Fruits All fresh, canned (in natural juice), or frozen fruits. Vegetables Fresh or frozen vegetables (raw, steamed, roasted, or grilled). Green salads. Grains Most grains. Choose whole wheat and whole grains most of the time. Rice and pasta, including brown  rice and pastas made with whole wheat. Meats and other proteins Lean, well-trimmed beef, veal, pork, and lamb. Chicken and [place] without skin. All fish and shellfish. Wild duck, rabbit, pheasant, and venison. Egg whites or low-cholesterol egg substitutes. Dried beans, peas, lentils, and tofu. Seeds and most nuts. Dairy Low-fat or nonfat cheeses, including ricotta and mozzarella. Skim or 1% milk that is liquid, powdered, or evaporated. Buttermilk that is made with low-fat milk. Nonfat or low-fat yogurt. Fats and oils Non-hydrogenated (trans-free) margarines. Vegetable oils, including soybean, sesame, sunflower, olive, peanut, safflower, corn, canola, and cottonseed. Salad dressings or mayonnaise made with a vegetable oil. Beverages Mineral water. Coffee and tea. Diet carbonated beverages. Sweets and desserts Sherbet, gelatin, and fruit ice. Small amounts of dark chocolate. Limit all sweets and desserts. Seasonings and condiments All seasonings and condiments. The items listed above may not be a complete list of foods and drinks you can eat. Contact a dietitian for more options. What foods should I avoid? Fruits Canned fruit in heavy syrup. Fruit in cream or butter sauce. Fried fruit. Limit coconut. Vegetables Vegetables cooked in cheese, cream, or butter sauce. Fried vegetables. Grains Breads that are made with saturated or trans fats, oils, or whole milk. Croissants. Sweet rolls. Donuts. High-fat crackers, such as cheese crackers. Meats and other proteins Fatty meats, such as hot dogs, ribs, sausage, bacon, rib-eye roast or steak. High-fat deli meats, such as salami and bologna. Caviar. Domestic duck and goose. Organ meats, such as liver. Dairy Cream, sour cream, cream cheese, and creamed cottage cheese. Whole-milk cheeses. Whole or 2% milk that is liquid, evaporated, or condensed. Whole buttermilk. Cream sauce or high-fat cheese sauce. Yogurt that is made from whole milk. Fats and  oils Meat fat, or shortening. Cocoa butter, hydrogenated oils, palm oil, coconut oil, palm kernel oil. Solid fats and shortenings, including bacon fat, salt pork, lard, and butter. Nondairy cream substitutes. Salad dressings with cheese or sour cream. Beverages Regular sodas and juice drinks with added sugar. Sweets and desserts Frosting. Pudding. Cookies. Cakes. Pies. Milk chocolate or white chocolate. Buttered syrups. Full-fat ice cream or ice cream drinks. The items listed above may not be a complete list of foods and drinks to avoid. Contact a dietitian for more information. Summary Heart-healthy meal planning includes eating less unhealthy fats, eating more healthy fats, and making other changes in your diet. Eat a balanced diet. This includes fruits and vegetables, low-fat or nonfat dairy, lean protein, nuts and legumes, whole grains, and heart-healthy oils and fats. This information is not intended to replace advice given to you by your health care provider. Make sure you discuss any questions you have with your health care provider. Document Revised: 10/21/2021 Document Reviewed: 10/21/2021 Elsevier Patient Education  2024 [employer].

## 2024-06-16 ENCOUNTER — Encounter (HOSPITAL_COMMUNITY): Payer: Self-pay

## 2024-06-20 ENCOUNTER — Other Ambulatory Visit: Payer: Self-pay | Admitting: Family Medicine

## 2024-06-20 ENCOUNTER — Other Ambulatory Visit (HOSPITAL_COMMUNITY): Payer: Self-pay

## 2024-06-21 ENCOUNTER — Other Ambulatory Visit (HOSPITAL_COMMUNITY): Payer: Self-pay

## 2024-06-21 MED ORDER — LIDOCAINE 5 % EX PTCH
2.0000 | MEDICATED_PATCH | CUTANEOUS | 5 refills | Status: DC
  Filled 2024-06-21: qty 30, 15d supply, fill #0
  Filled 2024-07-07: qty 30, 15d supply, fill #1
  Filled 2024-07-18: qty 30, 15d supply, fill #2
  Filled 2024-08-05: qty 30, 15d supply, fill #3
  Filled 2024-08-21: qty 30, 15d supply, fill #4
  Filled 2024-09-06 (×2): qty 30, 15d supply, fill #5

## 2024-06-22 ENCOUNTER — Telehealth: Payer: Self-pay | Admitting: *Deleted

## 2024-06-22 ENCOUNTER — Ambulatory Visit: Attending: Physician Assistant | Admitting: Occupational Therapy

## 2024-06-22 ENCOUNTER — Encounter: Payer: Self-pay | Admitting: Physical Therapy

## 2024-06-22 ENCOUNTER — Ambulatory Visit: Admitting: Physical Therapy

## 2024-06-22 ENCOUNTER — Other Ambulatory Visit (HOSPITAL_COMMUNITY): Payer: Self-pay

## 2024-06-22 ENCOUNTER — Other Ambulatory Visit: Payer: Self-pay

## 2024-06-22 VITALS — BP 92/58 | HR 92

## 2024-06-22 DIAGNOSIS — R293 Abnormal posture: Secondary | ICD-10-CM

## 2024-06-22 DIAGNOSIS — M62838 Other muscle spasm: Secondary | ICD-10-CM | POA: Diagnosis present

## 2024-06-22 DIAGNOSIS — R29898 Other symptoms and signs involving the musculoskeletal system: Secondary | ICD-10-CM

## 2024-06-22 DIAGNOSIS — M6281 Muscle weakness (generalized): Secondary | ICD-10-CM

## 2024-06-22 DIAGNOSIS — R208 Other disturbances of skin sensation: Secondary | ICD-10-CM

## 2024-06-22 DIAGNOSIS — M25512 Pain in left shoulder: Secondary | ICD-10-CM | POA: Diagnosis present

## 2024-06-22 DIAGNOSIS — R2689 Other abnormalities of gait and mobility: Secondary | ICD-10-CM | POA: Diagnosis present

## 2024-06-22 DIAGNOSIS — R278 Other lack of coordination: Secondary | ICD-10-CM | POA: Insufficient documentation

## 2024-06-22 DIAGNOSIS — M25511 Pain in right shoulder: Secondary | ICD-10-CM | POA: Insufficient documentation

## 2024-06-22 DIAGNOSIS — G8929 Other chronic pain: Secondary | ICD-10-CM

## 2024-06-22 DIAGNOSIS — M25341 Other instability, right hand: Secondary | ICD-10-CM | POA: Diagnosis present

## 2024-06-22 DIAGNOSIS — G8252 Quadriplegia, C1-C4 incomplete: Secondary | ICD-10-CM

## 2024-06-22 DIAGNOSIS — R2681 Unsteadiness on feet: Secondary | ICD-10-CM | POA: Diagnosis not present

## 2024-06-22 DIAGNOSIS — R29818 Other symptoms and signs involving the nervous system: Secondary | ICD-10-CM | POA: Insufficient documentation

## 2024-06-22 DIAGNOSIS — M25641 Stiffness of right hand, not elsewhere classified: Secondary | ICD-10-CM | POA: Insufficient documentation

## 2024-06-22 DIAGNOSIS — M25612 Stiffness of left shoulder, not elsewhere classified: Secondary | ICD-10-CM | POA: Insufficient documentation

## 2024-06-22 DIAGNOSIS — S14129A Central cord syndrome at unspecified level of cervical spinal cord, initial encounter: Secondary | ICD-10-CM | POA: Insufficient documentation

## 2024-06-22 NOTE — Patient Instructions (Signed)
 Access Code: [email or web address] URL: [email or web address] Date: 06/22/2024 Prepared by: Daved Bull  Exercises - Supine March  - 1 x daily - 7 x weekly - 3 sets - 10 reps - Supine Posterior Pelvic Tilt  - 1 x daily - 7 x weekly - 3 sets - 10 reps - Supine Ankle Pumps  - 1 x daily - 7 x weekly - 3 sets - 10 reps - Supine Heel Slide  - 1 x daily - 7 x weekly - 3 sets - 10 reps  Can try slow STS at home and daily walks in home w/ supervision 2-5 minutes on days where BP is better.

## 2024-06-22 NOTE — Telephone Encounter (Signed)
 Mr Layfield needs a work note to turn in to employer asap.

## 2024-06-22 NOTE — Therapy (Signed)
 OUTPATIENT OCCUPATIONAL THERAPY NEURO EVALUATION  Patient Name: Jerome Vaughn MRN: [id number] DOB:04-23-1951, 73 y.o., male Today's Date: 06/22/2024  PCP: [name] LABOR, MD REFERRING PROVIDER: [name] PARAS, PA-C  END OF SESSION:  OT End of Session - 06/22/24 1555     Visit Number 1    Number of Visits 16    Date for Recertification  08/22/24    Authorization Type [employer] MCR - form submitted    Progress Note Due on Visit 10    OT Start Time 1450    OT Stop Time 1540    OT Time Calculation (min) 50 min    Activity Tolerance Patient tolerated treatment well    Behavior During Therapy WFL for tasks assessed/performed          Past Medical History:  Diagnosis Date   Carpal tunnel syndrome    both hands   Colon polyps    Coronary artery disease    sees Dr. [name]   Diabetes mellitus    sees Dr. [name]   Diabetes mellitus [facility]) 2009   Ganglion cyst of wrist, right 1989   GERD (gastroesophageal reflux disease)    History of echocardiogram    a. Echo 4/17: Moderate LVH, EF 50-55%, mild LAE, no pericardial effusion   Hyperlipidemia    Hypertension    MGUS (monoclonal gammopathy of unknown significance)    sees Dr. [name]   Neuropathy    gets foot exams with Dr. [name] (podiatry)   Pneumonia    Rotator cuff injury    Past Surgical History:  Procedure Laterality Date   ANTERIOR CERVICAL DECOMP/DISCECTOMY FUSION N/A 04/19/2024   Procedure: ANTERIOR CERVICAL DECOMPRESSION/DISCECTOMY FUSION CERVICAL SIX-SEVEN;  Surgeon: [name] MATSU, MD;  Location: [facility] OR;  Service: Neurosurgery;  Laterality: N/A;  ACDF C67   CARDIAC CATHETERIZATION N/A 11/21/2015   Procedure: Left Heart Cath and Coronary Angiography;  Surgeon: [name], MD;  Location: [facility] INVASIVE CV LAB;  Service: Cardiovascular;  Laterality: N/A;   COLONOSCOPY  03/18/2024   per Dr. [name], adenomatous polyp, repeat in 5 yrs   CORONARY ARTERY BYPASS GRAFT N/A 11/22/2015   Procedure:  CORONARY ARTERY BYPASS GRAFTING (CABG) times five using the left internal mammary, right greater saphenous vein EVH, and left thigh greater saphenous vein EVH;  Surgeon: [name], MD;  Location: [facility] OR;  Service: Open Heart Surgery;  Laterality: N/A;   CORONARY ARTERY BYPASS GRAFT  2017   CORONARY PRESSURE/FFR STUDY N/A 12/14/2018   Procedure: INTRAVASCULAR PRESSURE WIRE/FFR STUDY;  Surgeon: [name], MD;  Location: [facility] INVASIVE CV LAB;  Service: Cardiovascular;  Laterality: N/A;   ESOPHAGOGASTRODUODENOSCOPY  03/18/2024   per Dr. [name], normal   frozen shoulder release Right    GANGLION CYST EXCISION Right 1989   LEFT HEART CATH AND CORS/GRAFTS ANGIOGRAPHY N/A 10/27/2017   Procedure: LEFT HEART CATH AND CORS/GRAFTS ANGIOGRAPHY;  Surgeon: Sharps [name], MD;  Location: [name];  Service: Cardiovascular;  Laterality: N/A;   LEFT HEART CATH AND CORS/GRAFTS ANGIOGRAPHY N/A 12/14/2018   Procedure: LEFT HEART CATH AND CORS/GRAFTS ANGIOGRAPHY;  Surgeon: [name], MD;  Location: [facility] INVASIVE CV LAB;  Service: Cardiovascular;  Laterality: N/A;   TEE WITHOUT CARDIOVERSION N/A 11/22/2015   Procedure: TRANSESOPHAGEAL ECHOCARDIOGRAM (TEE);  Surgeon: [name], MD;  Location: [facility] OR;  Service: Open Heart Surgery;  Laterality: N/A;   WRIST GANGLION EXCISION Right    Patient Active Problem List  Diagnosis Date Noted   Fall 05/27/2024   Orthostatic hypotension 05/27/2024   Post-traumatic spasticity 05/27/2024   Coping style affecting medical condition 05/04/2024   Central cord syndrome (HCC) 04/21/2024   Acute incomplete quadriplegia (HCC) 04/21/2024   Shock (HCC) 04/15/2024   Varicose veins of both lower extremities 07/03/2023   Full thickness rotator cuff tear 02/26/2023   Type 2 diabetes mellitus with diabetic polyneuropathy, without long-term current use of insulin  (HCC) 02/02/2023   Pancytopenia (HCC) 01/09/2023   OA (osteoarthritis) of knee  04/18/2022   Left shoulder pain 04/18/2022   GERD (gastroesophageal reflux disease)    Bacterial infection due to H. pylori 03/01/2020   Monoclonal gammopathy of unknown significance (MGUS) 03/01/2020   Iron  deficiency anemia 02/22/2020   Onychomycosis 02/08/2020   Rectal bleeding 01/24/2020   Indigestion 01/24/2020   Angina pectoris 12/12/2018   Erectile disorder due to medical condition in male 04/08/2018   Chronic combined systolic (congestive) and diastolic (congestive) heart failure (HCC) 07/29/2017   Pericardial effusion 12/24/2015   S/P CABG x 5 11/22/2015   Abnormal stress test 11/21/2015   Coronary artery disease of bypass graft of native heart with stable angina pectoris    Nonspecific abnormal electrocardiogram (ECG) (EKG) 11/07/2015   Essential hypertension 03/30/2011   Hypercholesterolemia 03/30/2011   Colon polyp 03/30/2011    ONSET DATE: 05/17/2024 (referral date)    REFERRING DIAG: G82.50 (ICD-10-CM) - Quadriplegia, unspecified S14.129A (ICD-10-CM) - Central cord syndrome, initial encounter (HCC)    THERAPY DIAG:  Central cord syndrome, initial encounter (HCC)  Muscle weakness (generalized)  Other lack of coordination  Other disturbances of skin sensation  Unsteadiness on feet  Stiffness of left shoulder, not elsewhere classified  Stiffness of right hand, not elsewhere classified  Rationale for Evaluation and Treatment: Rehabilitation  SUBJECTIVE:   SUBJECTIVE STATEMENT: I already had rotator cuff tears in both shoulders prior to the accident and they were going to eventually do surgery but I had more motion in my Lt shoulder Pt accompanied by: [name]   PERTINENT HISTORY: Presented 04/15/2024 after being on a roof cutting a tree branch when the tree fell on him pinning him against the roof. Pt underwent ACDF C6-7 on 04/19/24. No loss of consciousness. PMH:  significant for diabetes mellitus, CAD with CABG 2017, hypertension,  hyperlipidemia  IMPRESSION from MRI on 04/16/24: 1. No acute fracture or ligamentous injury of the cervical spine. 2. Focal hyperintense T2-weighted signal within the spinal cord at the C3-4 levels and at C7, which may indicate myelomalacia or spinal cord edema. 3. Severe spinal canal stenosis and bilateral neural foraminal stenosis at C6-7. 4. Mild spinal canal stenosis and severe bilateral neural foraminal stenosis at C3-4. 5. Moderate left C2-3, left C4-5 and bilateral C5-6 neural foraminal stenosis.  PRECAUTIONS: Cervical, Fall, and Other: soft collar, orthostatic hypotension (wears abdominal binder and TED hoses), lifting restrictions   WEIGHT BEARING RESTRICTIONS: ? UE wt bearing  PAIN:  Are you having pain? Yes: NPRS scale: 0-7/10, avg 3-4/10  Pain location: bilateral shoulders  Pain description: achy/sore and Rt shoulder can be throbbing and stabbing at times Aggravating factors: certain positions Relieving factors: changing positions, OTC meds and muscle relaxers  FALLS: Has patient fallen in last 6 months? Yes. 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  HAND DOMINANCE: Right  ADLs: Transfers/ambulation related to ADLs: rollator Eating: mod I using Rt hand mostly with adapted utensils, wife cuts food Grooming: mod I for brushing teeth/shaving using both hands, except wife brushes pt's hair UB Dressing: min assist - improving LB Dressing: max assist for TED hose, fluctuating assist for  pants/shoes Toileting: supervision and assist for wiping Bathing: assist w/ lower legs and back  Tub Shower transfers: min assist Equipment: Shower seat with back and Grab bars  IADLs:  Shopping: pt has gone with wife 2x since d/c from hospital Light housekeeping: dependent Meal Prep: dependent Community mobility: dependent Medication management: wife places in pillbox, wife assists Handwriting: 90% legible in print (pt reports signature was always bad)   MOBILITY STATUS: using rollator   UPPER EXTREMITY ROM:  RUE AROM at shoulder and elbow WFL's. Limited more distally with 75% wrist and 50% composite flexion  LUE shoulder movement limited in approx 45* scaption (lesser true flex), 75% ER, 90% IR, Elbows distally WFLs w/ difficulty w/ index PIP extension   UPPER EXTREMITY MMT:   not tested d/t precautions, current deficits and premorbid rotator cuff tears bilaterally    HAND FUNCTION: Grip strength: Right: 5.2 lbs; Left: 29.3 lbs  COORDINATION: 9 Hole Peg test: Right: placed 6 in 2 min;   Left: 117.83 sec Box and Blocks:  Right 31 blocks, Left 35 blocks  SENSATION: Light touch: WFL Pt reports some numbness in fingertips (premorbid but more pronounced since accident)   EDEMA: mild Rt hand, slightly worse in fingers Rt hand  MUSCLE TONE: pt has reported spasms and on muscle relaxer's (take at night)   COGNITION: Overall cognitive status: Within functional limits for tasks assessed  VISION: Subjective report: Pt reports no changes  Baseline vision: Bifocals Visual history: none    PERCEPTION: Not tested  PRAXIS: Not tested  OBSERVATIONS: bilateral hand deficits (Rt worse than Lt), bilateral shoulder deficits (Lt worse than Rt)  Premorbid rotator cuff tears but pt reports he had majority of ROM both shoulders prior to accident - now limited in ROM Lt shoulder                                                                                                                              TREATMENT DATE: 06/22/24  Discussed O.T. findings, POC/Goals.   Also discussed some activities he can do for coordination for bilateral hands until pt can be issued formal HEP  (Flipping cards over, picking up small objects like coins, checkers, dominoes)         PATIENT EDUCATION: Education details: SEE ABOVE Person educated: Patient and Spouse Education method: Explanation Education comprehension: verbalized understanding  HOME EXERCISE PROGRAM: N/A   GOALS:  Goals reviewed with patient? Yes  SHORT TERM GOALS: Target date: 07/22/24  Pt independent with HEP for bilateral coordination and grip strength Baseline: Goal status: INITIAL  2.  Pt independent with modified shoulder HEP (premorbid rotator cuff tear)  Baseline:  Goal status: INITIAL  3.  Pt to consistently be mod I for donning/doffing overhead shirts and UB bathing w/ AE prn Baseline:  Goal status: INITIAL  4.  Pt to perform LE dressing w/ min assist using AE prn (w/ exception to TED hoses)  Baseline: mod to max assist Goal status: INITIAL  5.  Grip strength to increase by 10 lbs or more on Rt side and 5 lbs or more on Lt side Baseline: 5.2 lbs, 29.3 lbs Goal status: INITIAL  6.  Improve BUE function as evidenced by increasing Box & Blocks by 5 or more blocks Baseline: Rt = 31, Lt = 35 Goal status: INITIAL  LONG TERM GOALS: Target date: 08/22/24  Independent with updated HEP  Baseline:  Goal status: INITIAL  2.  Pt to be mod I for all BADLS including brushing hair and LB dressing (exception TED hoses)  Baseline:  Goal status: INITIAL  3.  Improve Rt hand coordination as evidenced by reducing speed on 9 hole peg test in under 2 minutes Baseline:  Goal status: INITIAL  4.  Improve Lt hand coordination as evidenced by reducing speed on 9 hole peg test in under 2 minutes Baseline:  Goal status: INITIAL  5.  Pt to perform light IADLS in standing with countertop support w/o LOB   Baseline:  Goal status: INITIAL  6.  Pt to improve LUE shoulder ROM to 70* flexion or greater for mid level reaching Baseline:  Goal status: INITIAL  ASSESSMENT:  CLINICAL IMPRESSION: Patient is a 73 y.o. male who was seen today for occupational therapy evaluation for incomplete quadriplegia (central cord syndrome) from roofing accident on 04/15/24,  s/p ACDF on 04/19/24. Pt remains in soft collar. Pt also w/ orthostatic hypotension wearing compression hoses and abdominal binder. Hx includes bilateral rotator cuff tears, DM, CAD, CABG 2017, HTN and HLD. Patient currently presents below baseline level of functioning demonstrating functional deficits and impairments as noted below. Pt would benefit from skilled OT services in the outpatient setting to work on impairments as noted below to help pt return to PLOF as able.     PERFORMANCE DEFICITS: in functional skills including ADLs, IADLs, coordination, dexterity, sensation, edema, ROM, strength, pain, flexibility, Fine motor control, Gross motor control, mobility, body mechanics, cardiopulmonary status limiting function, decreased knowledge of precautions, decreased knowledge of use of DME, and UE functional use.   IMPAIRMENTS: are limiting patient from ADLs, IADLs, rest and sleep, work, leisure, and social participation.   CO-MORBIDITIES: has co-morbidities such as orthostatic hypotension, bilateral rotator cuff tears that affects occupational performance. Patient will benefit from skilled OT to address above impairments and improve overall function.  MODIFICATION OR ASSISTANCE TO COMPLETE EVALUATION: Min-Moderate modification of tasks or assist with assess necessary to complete an evaluation.  OT OCCUPATIONAL PROFILE AND HISTORY: Detailed assessment: Review of records and additional review of physical, cognitive, psychosocial history related to current functional performance.  CLINICAL DECISION MAKING: Moderate - several treatment options,  min-mod task modification necessary  REHAB POTENTIAL: Good  EVALUATION COMPLEXITY: Moderate    PLAN:  OT FREQUENCY: 2x/week  OT DURATION: 8 weeks  PLANNED INTERVENTIONS: 97535 self care/ADL training, 02889 therapeutic exercise, 97530 therapeutic activity, 97112 neuromuscular re-education, 97140 manual therapy, [id number] aquatic therapy,  97018 paraffin, 02960 fluidotherapy, 97010 moist heat, 97760 Orthotic Initial, 97763 Orthotic/Prosthetic subsequent, passive range of motion, functional mobility training, patient/family education, and DME and/or AE instructions  RECOMMENDED OTHER SERVICES: none at this time  CONSULTED AND AGREED WITH PLAN OF CARE: Patient and family member/caregiver  PLAN FOR NEXT SESSION: HEP for bilateral hands (coordination, putty)    Burnard JINNY Roads, OT 06/22/2024, 3:56 PM
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
## 2024-06-23 ENCOUNTER — Encounter: Payer: Self-pay | Admitting: Podiatry

## 2024-06-23 ENCOUNTER — Ambulatory Visit (INDEPENDENT_AMBULATORY_CARE_PROVIDER_SITE_OTHER): Admitting: Podiatry

## 2024-06-23 ENCOUNTER — Encounter: Payer: Self-pay | Admitting: Physical Medicine and Rehabilitation

## 2024-06-23 DIAGNOSIS — E118 Type 2 diabetes mellitus with unspecified complications: Secondary | ICD-10-CM | POA: Diagnosis not present

## 2024-06-23 DIAGNOSIS — M79675 Pain in left toe(s): Secondary | ICD-10-CM | POA: Diagnosis not present

## 2024-06-23 DIAGNOSIS — M79674 Pain in right toe(s): Secondary | ICD-10-CM | POA: Diagnosis not present

## 2024-06-23 DIAGNOSIS — B351 Tinea unguium: Secondary | ICD-10-CM | POA: Diagnosis not present

## 2024-06-23 NOTE — Progress Notes (Signed)
 This patient returns to my office for at risk foot care.  This patient requires this care by a professional since this patient will be at risk due to having This patient is unable to cut nails himself since the patient cannot reach his nails.These nails are painful walking and wearing shoes.  This patient presents for at risk foot care today.  General Appearance  Alert, conversant and in no acute stress.  Vascular  Dorsalis pedis and posterior tibial  pulses are palpable  bilaterally.  Capillary return is within normal limits  bilaterally. Temperature is within normal limits  bilaterally.  Neurologic  Senn-Weinstein monofilament wire test within normal limits  bilaterally. Muscle power within normal limits bilaterally.  Nails Thick disfigured discolored nails with subungual debris  from hallux to fifth toes bilaterally. No evidence of bacterial infection or drainage bilaterally.  Orthopedic  No limitations of motion  feet .  No crepitus or effusions noted.  No bony pathology or digital deformities noted.  Skin  normotropic skin with no porokeratosis noted bilaterally.  No signs of infections or ulcers noted.     Onychomycosis  Pain in right toes  Pain in left toes  Consent was obtained for treatment procedures.   Mechanical debridement of nails 1-5  bilaterally performed with a nail nipper.  Filed with dremel without incident.    Return office visit     3 months                Told patient to return for periodic foot care and evaluation due to potential at risk complications.   Helane Gunther DPM

## 2024-06-24 NOTE — Telephone Encounter (Signed)
 Called pt- filled out Rx for him- to get out of work 06/13/24 to 09/30/24 and maybe longer- given to front desk

## 2024-07-04 ENCOUNTER — Ambulatory Visit: Admitting: Physical Therapy

## 2024-07-04 ENCOUNTER — Ambulatory Visit: Attending: Physician Assistant

## 2024-07-04 ENCOUNTER — Encounter: Payer: Self-pay | Admitting: Physical Therapy

## 2024-07-04 VITALS — BP 108/64 | HR 86

## 2024-07-04 DIAGNOSIS — G8252 Quadriplegia, C1-C4 incomplete: Secondary | ICD-10-CM | POA: Insufficient documentation

## 2024-07-04 DIAGNOSIS — M25511 Pain in right shoulder: Secondary | ICD-10-CM | POA: Insufficient documentation

## 2024-07-04 DIAGNOSIS — R29818 Other symptoms and signs involving the nervous system: Secondary | ICD-10-CM

## 2024-07-04 DIAGNOSIS — G8929 Other chronic pain: Secondary | ICD-10-CM | POA: Diagnosis present

## 2024-07-04 DIAGNOSIS — M25341 Other instability, right hand: Secondary | ICD-10-CM | POA: Insufficient documentation

## 2024-07-04 DIAGNOSIS — M6281 Muscle weakness (generalized): Secondary | ICD-10-CM | POA: Diagnosis present

## 2024-07-04 DIAGNOSIS — M62838 Other muscle spasm: Secondary | ICD-10-CM

## 2024-07-04 DIAGNOSIS — R278 Other lack of coordination: Secondary | ICD-10-CM

## 2024-07-04 DIAGNOSIS — R29898 Other symptoms and signs involving the musculoskeletal system: Secondary | ICD-10-CM | POA: Diagnosis present

## 2024-07-04 DIAGNOSIS — R293 Abnormal posture: Secondary | ICD-10-CM

## 2024-07-04 DIAGNOSIS — R41840 Attention and concentration deficit: Secondary | ICD-10-CM | POA: Diagnosis present

## 2024-07-04 DIAGNOSIS — R208 Other disturbances of skin sensation: Secondary | ICD-10-CM

## 2024-07-04 DIAGNOSIS — M25512 Pain in left shoulder: Secondary | ICD-10-CM | POA: Diagnosis present

## 2024-07-04 DIAGNOSIS — R2689 Other abnormalities of gait and mobility: Secondary | ICD-10-CM | POA: Insufficient documentation

## 2024-07-04 NOTE — Patient Instructions (Addendum)
  Coordination Activities Perform the following activities for 10-15 minutes 1 times per [place] with both hand(s).  Rotate ball in fingertips (clockwise and counter-clockwise). Flip cards 1 at a time as fast as you can. Deal cards with your thumb (Hold deck in hand and push card off top with thumb). Pick up coins and stack. Screw together nuts and bolts, then unfasten.

## 2024-07-04 NOTE — Therapy (Signed)
 OUTPATIENT OCCUPATIONAL THERAPY NEURO TREATMENT  Patient Name: Jerome Vaughn MRN: [id number] DOB:September 06, 1951, 73 y.o., male Today's Date: 07/04/2024  PCP: [name] LABOR, MD REFERRING PROVIDER: [name] PARAS, PA-C  END OF SESSION:  OT End of Session - 07/04/24 1548     Visit Number 2    Number of Visits 16    Date for Recertification  08/22/24    Authorization Type [facility] - form submitted    Authorization Time Period 06/22/24-08/29/24    Authorization - Visit Number 2    Authorization - Number of Visits 16    Progress Note Due on Visit 10    OT Start Time 1535    OT Stop Time 1615    OT Time Calculation (min) 40 min    Equipment Utilized During Treatment yellow theraputty, cards,    Activity Tolerance Patient tolerated treatment well    Behavior During Therapy WFL for tasks assessed/performed           Past Medical History:  Diagnosis Date   Carpal tunnel syndrome    both hands   Colon polyps    Coronary artery disease    sees Dr. [name]   Diabetes mellitus    sees Dr. [name]   Diabetes mellitus [facility]) 2009   Ganglion cyst of wrist, right 1989   GERD (gastroesophageal reflux disease)    History of echocardiogram    a. Echo 4/17: Moderate LVH, EF 50-55%, mild LAE, no pericardial effusion   Hyperlipidemia    Hypertension    MGUS (monoclonal gammopathy of unknown significance)    sees Dr. [name]   Neuropathy    gets foot exams with Dr. [name] (podiatry)   Pneumonia    Rotator cuff injury    Past Surgical History:  Procedure Laterality Date   ANTERIOR CERVICAL DECOMP/DISCECTOMY FUSION N/A 04/19/2024   Procedure: ANTERIOR CERVICAL DECOMPRESSION/DISCECTOMY FUSION CERVICAL SIX-SEVEN;  Surgeon: [name] MATSU, MD;  Location: [facility] OR;  Service: Neurosurgery;  Laterality: N/A;  ACDF C67   CARDIAC CATHETERIZATION N/A 11/21/2015   Procedure: Left Heart Cath and Coronary Angiography;  Surgeon: [name], MD;  Location: [facility] INVASIVE CV LAB;   Service: Cardiovascular;  Laterality: N/A;   COLONOSCOPY  03/18/2024   per Dr. [name], adenomatous polyp, repeat in 5 yrs   CORONARY ARTERY BYPASS GRAFT N/A 11/22/2015   Procedure: CORONARY ARTERY BYPASS GRAFTING (CABG) times five using the left internal mammary, right greater saphenous vein EVH, and left thigh greater saphenous vein EVH;  Surgeon: [name], MD;  Location: [facility] OR;  Service: Open Heart Surgery;  Laterality: N/A;   CORONARY ARTERY BYPASS GRAFT  2017   CORONARY PRESSURE/FFR STUDY N/A 12/14/2018   Procedure: INTRAVASCULAR PRESSURE WIRE/FFR STUDY;  Surgeon: [name], MD;  Location: [facility] INVASIVE CV LAB;  Service: Cardiovascular;  Laterality: N/A;   ESOPHAGOGASTRODUODENOSCOPY  03/18/2024   per Dr. [name], normal   frozen shoulder release Right    GANGLION CYST EXCISION Right 1989   LEFT HEART CATH AND CORS/GRAFTS ANGIOGRAPHY N/A 10/27/2017   Procedure: LEFT HEART CATH AND CORS/GRAFTS ANGIOGRAPHY;  Surgeon: Sharps [name], MD;  Location: [name];  Service: Cardiovascular;  Laterality: N/A;   LEFT HEART CATH AND CORS/GRAFTS ANGIOGRAPHY N/A 12/14/2018   Procedure: LEFT HEART CATH AND CORS/GRAFTS ANGIOGRAPHY;  Surgeon: [name], MD;  Location: [facility] INVASIVE CV LAB;  Service: Cardiovascular;  Laterality: N/A;   TEE WITHOUT CARDIOVERSION N/A 11/22/2015   Procedure: TRANSESOPHAGEAL  ECHOCARDIOGRAM (TEE);  Surgeon: [name], MD;  Location: [facility] OR;  Service: Open Heart Surgery;  Laterality: N/A;   WRIST GANGLION EXCISION Right    Patient Active Problem List   Diagnosis Date Noted   Fall 05/27/2024   Orthostatic hypotension 05/27/2024   Post-traumatic spasticity 05/27/2024   Coping style affecting medical condition 05/04/2024   Central cord syndrome (HCC) 04/21/2024   Acute incomplete quadriplegia (HCC) 04/21/2024   Shock (HCC) 04/15/2024   Varicose veins of both lower extremities 07/03/2023   Full thickness rotator cuff tear 02/26/2023    Type 2 diabetes mellitus with diabetic polyneuropathy, without long-term current use of insulin  (HCC) 02/02/2023   Pancytopenia (HCC) 01/09/2023   OA (osteoarthritis) of knee 04/18/2022   Left shoulder pain 04/18/2022   GERD (gastroesophageal reflux disease)    Bacterial infection due to H. pylori 03/01/2020   Monoclonal gammopathy of unknown significance (MGUS) 03/01/2020   Iron  deficiency anemia 02/22/2020   Onychomycosis 02/08/2020   Rectal bleeding 01/24/2020   Indigestion 01/24/2020   Angina pectoris 12/12/2018   Erectile disorder due to medical condition in male 04/08/2018   Chronic combined systolic (congestive) and diastolic (congestive) heart failure (HCC) 07/29/2017   Pericardial effusion 12/24/2015   S/P CABG x 5 11/22/2015   Abnormal stress test 11/21/2015   Coronary artery disease of bypass graft of native heart with stable angina pectoris    Nonspecific abnormal electrocardiogram (ECG) (EKG) 11/07/2015   Essential hypertension 03/30/2011   Hypercholesterolemia 03/30/2011   Colon polyp 03/30/2011    ONSET DATE: 05/17/2024 (referral date)    REFERRING DIAG: G82.50 (ICD-10-CM) - Quadriplegia, unspecified S14.129A (ICD-10-CM) - Central cord syndrome, initial encounter (HCC)    THERAPY DIAG:  Other lack of coordination  Muscle weakness (generalized)  Quadriplegia, C1-C4 incomplete (HCC)  Rationale for Evaluation and Treatment: Rehabilitation  SUBJECTIVE:   SUBJECTIVE STATEMENT: Just a little sore and stiff after physical therapy. Pt accompanied by: Wife (Jerome Vaughn)   PERTINENT HISTORY: Presented 04/15/2024 after being on a roof cutting a tree branch when the tree fell on him pinning him against the roof. Pt underwent ACDF C6-7 on 04/19/24. No loss of consciousness. PMH:  significant for diabetes mellitus, CAD with CABG 2017, hypertension, hyperlipidemia  IMPRESSION from MRI on 04/16/24: 1. No acute fracture or ligamentous injury of the cervical spine. 2. Focal  hyperintense T2-weighted signal within the spinal cord at the C3-4 levels and at C7, which may indicate myelomalacia or spinal cord edema. 3. Severe spinal canal stenosis and bilateral neural foraminal stenosis at C6-7. 4. Mild spinal canal stenosis and severe bilateral neural foraminal stenosis at C3-4. 5. Moderate left C2-3, left C4-5 and bilateral C5-6 neural foraminal stenosis.  PRECAUTIONS: Cervical, Fall, and Other: soft collar, orthostatic hypotension (wears abdominal binder and TED hoses), lifting restrictions   WEIGHT BEARING RESTRICTIONS: ? UE wt bearing  PAIN:  Are you having pain? Yes: NPRS scale: 0-7/10, avg 3-4/10  Pain location: bilateral shoulders  Pain description: stiffness  Aggravating factors: certain positions Relieving factors: changing positions, OTC meds and muscle relaxers  FALLS: Has patient fallen in last 6 months? Yes. 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  HAND DOMINANCE: Right  ADLs: Transfers/ambulation related to ADLs: rollator Eating: mod I using Rt hand mostly with adapted utensils, wife cuts food Grooming: mod I for brushing teeth/shaving using both hands, except wife brushes pt's hair UB Dressing: min assist - improving LB Dressing: max assist for TED hose, fluctuating assist for pants/shoes Toileting: supervision and assist for wiping Bathing: assist w/ lower legs and back  Tub Shower transfers: min assist Equipment: Shower seat with back and Grab  bars  IADLs:  Shopping: pt has gone with wife 2x since d/c from hospital Light housekeeping: dependent Meal Prep: dependent Community mobility: dependent Medication management: wife places in pillbox, wife assists Handwriting: 90% legible in print (pt reports signature was always bad)   MOBILITY STATUS: using rollator   UPPER EXTREMITY ROM:  RUE AROM at shoulder and elbow WFL's. Limited more distally with 75% wrist and 50% composite flexion  LUE shoulder movement limited in approx 45* scaption (lesser true flex), 75% ER, 90% IR, Elbows distally WFLs w/ difficulty w/ index PIP extension   UPPER EXTREMITY MMT:   not tested d/t precautions, current deficits and premorbid rotator cuff tears bilaterally    HAND FUNCTION: Grip strength: Right: 5.2 lbs; Left: 29.3 lbs  COORDINATION: 9 Hole Peg test: Right: placed 6 in 2 min;   Left: 117.83 sec Box and Blocks:  Right 31 blocks, Left 35 blocks  SENSATION: Light touch: WFL Pt reports some numbness in fingertips (premorbid but more pronounced since accident)   EDEMA: mild Rt hand, slightly worse in fingers Rt hand  MUSCLE TONE: pt has reported spasms and on muscle relaxer's (take at night)   COGNITION: Overall cognitive status: Within functional limits for tasks assessed  VISION: Subjective report: Pt reports no changes  Baseline vision: Bifocals Visual history: none    PERCEPTION: Not tested  PRAXIS: Not tested  OBSERVATIONS: bilateral hand deficits (Rt worse than Lt), bilateral shoulder deficits (Lt worse than Rt)  Premorbid rotator cuff tears but pt reports he had majority of ROM both shoulders prior to accident - now limited in ROM Lt shoulder                                                                                                                             TREATMENT DATE: 07/04/24 Educated pt in theraputty HEP to improve grip strength and FM coordination to carry over with ADLs and IADL. Pt given green medium  resistance putty to begin, graded down to yellow for increased participation and success with R hand. Also educated in FM coordination activities to improve dexterity in B hands for ADLs/IADLs carryover. Educated in compensatory techniques for FM coordination for R hand (using quarters instead of coins and stacking with use of tip to tip pinch d/t impaired ability to close R hand at this time, and to grade up through reducing size  of coins when this task becomes easy). See Pt instruction for detailed handouts.         PATIENT EDUCATION: Education details: Theraputty HEP and care for putty Person educated: Patient and Spouse Education method: Explanation, Verbal cues, and Handouts Education comprehension: verbalized understanding and returned demonstration  HOME EXERCISE PROGRAM: 07/04/24: yellow theraputty HEP (ACCESS CODE [id number]), coordination    GOALS: Goals reviewed with patient? Yes  SHORT TERM GOALS: Target date: 07/22/24  Pt independent with HEP for bilateral coordination and grip strength Baseline:  Goal status: IN PROGRESS  2.  Pt independent with modified shoulder HEP (premorbid rotator cuff tear)  Baseline:  Goal status: INITIAL  3.  Pt to consistently be mod I for donning/doffing overhead shirts and UB bathing w/ AE prn Baseline:  Goal status: INITIAL  4.  Pt to perform LE dressing w/ min assist using AE prn (w/ exception to TED hoses)  Baseline: mod to max assist Goal status: INITIAL  5.  Grip strength to increase by 10 lbs or more on Rt side and 5 lbs or more on Lt side Baseline: 5.2 lbs, 29.3 lbs Goal status: INITIAL  6.  Improve BUE function as evidenced by increasing Box & Blocks by 5 or more blocks Baseline: Rt = 31, Lt = 35 Goal status: INITIAL  LONG TERM GOALS: Target date: 08/22/24  Independent with updated HEP  Baseline:  Goal status: INITIAL  2.  Pt to be mod I for all BADLS including brushing hair and LB dressing (exception TED hoses)   Baseline:  Goal status: INITIAL  3.  Improve Rt hand coordination as evidenced by reducing speed on 9 hole peg test in under 2 minutes Baseline:  Goal status: INITIAL  4.  Improve Lt hand coordination as evidenced by reducing speed on 9 hole peg test in under 2 minutes Baseline:  Goal status: INITIAL  5.  Pt to perform light IADLS in standing with countertop support w/o LOB  Baseline:  Goal status: INITIAL  6.  Pt to improve LUE shoulder ROM to 70* flexion or greater for mid level reaching Baseline:  Goal status: INITIAL  ASSESSMENT:  CLINICAL IMPRESSION: Patient is a 73 y.o. male who was seen today for occupational therapy tx for incomplete quadriplegia (central cord syndrome) from roofing accident on 04/15/24,  s/p ACDF on 04/19/24. Pt participating well with putty HEP, however impaired grip strength especially in R hand affecting ability to complete ADL and leisure tasks, required grading down during putty and coordination activities (ie using yellow putty instead of green, using quarters instead of pennies in R hand). Pt would benefit from continued skilled services to begin addressing limited shoulder ROM and continue addressing impaired grip and FM coordination (especially in R hand)  PERFORMANCE DEFICITS: in functional skills including ADLs, IADLs, coordination, dexterity, sensation, edema, ROM, strength, pain, flexibility, Fine motor control, Gross motor control, mobility, body mechanics, cardiopulmonary status limiting function, decreased knowledge of precautions, decreased knowledge of use of DME, and UE functional use.   IMPAIRMENTS: are limiting patient from ADLs, IADLs, rest and sleep, work, leisure, and social participation.   CO-MORBIDITIES: has co-morbidities such as orthostatic hypotension, bilateral rotator cuff tears that affects occupational performance. Patient will benefit from skilled OT to address above impairments and improve overall function.  MODIFICATION OR  ASSISTANCE TO COMPLETE EVALUATION: Min-Moderate modification of tasks or assist with assess necessary to complete an evaluation.  OT OCCUPATIONAL PROFILE AND HISTORY: Detailed assessment: Review of records and additional review of physical,  cognitive, psychosocial history related to current functional performance.  CLINICAL DECISION MAKING: Moderate - several treatment options, min-mod task modification necessary  REHAB POTENTIAL: Good  EVALUATION COMPLEXITY: Moderate    PLAN:  OT FREQUENCY: 2x/week  OT DURATION: 8 weeks  PLANNED INTERVENTIONS: 97535 self care/ADL training, 02889 therapeutic exercise, 97530 therapeutic activity, 97112 neuromuscular re-education, 97140 manual therapy, 97113 aquatic therapy, 97018 paraffin, 02960 fluidotherapy, 97010 moist heat, 97760 Orthotic Initial, 97763 Orthotic/Prosthetic subsequent, passive range of motion, functional mobility training, patient/family education, and DME and/or AE instructions  RECOMMENDED OTHER SERVICES: none at this time  CONSULTED AND AGREED WITH PLAN OF CARE: Patient and family member/caregiver  PLAN FOR NEXT SESSION:  modified shoulder HEP (premorbid rotator cuff tear)  Fm coordination activities  F/u HEPs prn   Rocky Dutch, OT 07/04/2024, 4:25 PM
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
## 2024-07-04 NOTE — Patient Instructions (Signed)
 Access Code: [email or web address] URL: [email or web address] Date: 07/04/2024 Prepared by: Daved Bull  Exercises - Supine March  - 1 x daily - 7 x weekly - 3 sets - 10 reps - Supine Posterior Pelvic Tilt  - 1 x daily - 7 x weekly - 3 sets - 10 reps - Supine Ankle Pumps  - 1 x daily - 7 x weekly - 3 sets - 10 reps - Supine Heel Slide  - 1 x daily - 7 x weekly - 3 sets - 10 reps - Side Stepping with Resistance at Thighs and Counter Support  - 1 x daily - 5 x weekly - 3 sets - 10 reps - Forward Backward Monster Walk with Band at Thighs and Counter Support  - 1 x daily - 5 x weekly - 3 sets - 10 reps

## 2024-07-08 ENCOUNTER — Ambulatory Visit

## 2024-07-08 ENCOUNTER — Ambulatory Visit: Admitting: Physical Therapy

## 2024-07-10 ENCOUNTER — Encounter (HOSPITAL_COMMUNITY): Payer: Self-pay

## 2024-07-11 ENCOUNTER — Ambulatory Visit: Admitting: Physical Therapy

## 2024-07-11 ENCOUNTER — Ambulatory Visit

## 2024-07-13 DIAGNOSIS — M1711 Unilateral primary osteoarthritis, right knee: Secondary | ICD-10-CM | POA: Diagnosis not present

## 2024-07-14 ENCOUNTER — Ambulatory Visit

## 2024-07-14 ENCOUNTER — Encounter: Payer: Self-pay | Admitting: Physical Therapy

## 2024-07-14 ENCOUNTER — Ambulatory Visit: Admitting: Physical Therapy

## 2024-07-14 VITALS — BP 146/92 | HR 77

## 2024-07-14 DIAGNOSIS — M62838 Other muscle spasm: Secondary | ICD-10-CM

## 2024-07-14 DIAGNOSIS — M25341 Other instability, right hand: Secondary | ICD-10-CM

## 2024-07-14 DIAGNOSIS — M6281 Muscle weakness (generalized): Secondary | ICD-10-CM

## 2024-07-14 DIAGNOSIS — R29818 Other symptoms and signs involving the nervous system: Secondary | ICD-10-CM

## 2024-07-14 DIAGNOSIS — R29898 Other symptoms and signs involving the musculoskeletal system: Secondary | ICD-10-CM

## 2024-07-14 DIAGNOSIS — R278 Other lack of coordination: Secondary | ICD-10-CM | POA: Diagnosis not present

## 2024-07-14 DIAGNOSIS — R2689 Other abnormalities of gait and mobility: Secondary | ICD-10-CM

## 2024-07-14 DIAGNOSIS — R293 Abnormal posture: Secondary | ICD-10-CM | POA: Diagnosis not present

## 2024-07-14 DIAGNOSIS — G8252 Quadriplegia, C1-C4 incomplete: Secondary | ICD-10-CM | POA: Diagnosis not present

## 2024-07-14 DIAGNOSIS — R208 Other disturbances of skin sensation: Secondary | ICD-10-CM

## 2024-07-14 DIAGNOSIS — G8929 Other chronic pain: Secondary | ICD-10-CM

## 2024-07-14 NOTE — Therapy (Signed)
 OUTPATIENT OCCUPATIONAL THERAPY NEURO TREATMENT  Patient Name: Jerome Vaughn MRN: [id number] DOB:23-Apr-1951, 73 y.o., male Today's Date: 07/14/2024  PCP: [name] LABOR, MD REFERRING PROVIDER: [name] PARAS, PA-C  END OF SESSION:  OT End of Session - 07/14/24 1634     Visit Number 3    Number of Visits 16    Date for Recertification  08/22/24    Authorization Type [facility] - form submitted    Authorization Time Period 06/22/24-08/29/24    Authorization - Visit Number 3    Authorization - Number of Visits 16    Progress Note Due on Visit 10    OT Start Time 1445    OT Stop Time 1528    OT Time Calculation (min) 43 min    Equipment Utilized During Treatment dressing stick, sock aide, long handled sponge, long handled shoe horn, rocker knife    Activity Tolerance Patient tolerated treatment well    Behavior During Therapy WFL for tasks assessed/performed            Past Medical History:  Diagnosis Date   Carpal tunnel syndrome    both hands   Colon polyps    Coronary artery disease    sees Dr. [name]   Diabetes mellitus    sees Dr. [name]   Diabetes mellitus [facility]) 2009   Ganglion cyst of wrist, right 1989   GERD (gastroesophageal reflux disease)    History of echocardiogram    a. Echo 4/17: Moderate LVH, EF 50-55%, mild LAE, no pericardial effusion   Hyperlipidemia    Hypertension    MGUS (monoclonal gammopathy of unknown significance)    sees Dr. [name]   Neuropathy    gets foot exams with Dr. [name] (podiatry)   Pneumonia    Rotator cuff injury    Past Surgical History:  Procedure Laterality Date   ANTERIOR CERVICAL DECOMP/DISCECTOMY FUSION N/A 04/19/2024   Procedure: ANTERIOR CERVICAL DECOMPRESSION/DISCECTOMY FUSION CERVICAL SIX-SEVEN;  Surgeon: [name] MATSU, MD;  Location: [facility] OR;  Service: Neurosurgery;  Laterality: N/A;  ACDF C67   CARDIAC CATHETERIZATION N/A 11/21/2015   Procedure: Left Heart Cath and Coronary  Angiography;  Surgeon: [name], MD;  Location: [facility] INVASIVE CV LAB;  Service: Cardiovascular;  Laterality: N/A;   COLONOSCOPY  03/18/2024   per Dr. [name], adenomatous polyp, repeat in 5 yrs   CORONARY ARTERY BYPASS GRAFT N/A 11/22/2015   Procedure: CORONARY ARTERY BYPASS GRAFTING (CABG) times five using the left internal mammary, right greater saphenous vein EVH, and left thigh greater saphenous vein EVH;  Surgeon: [name], MD;  Location: [facility] OR;  Service: Open Heart Surgery;  Laterality: N/A;   CORONARY ARTERY BYPASS GRAFT  2017   CORONARY PRESSURE/FFR STUDY N/A 12/14/2018   Procedure: INTRAVASCULAR PRESSURE WIRE/FFR STUDY;  Surgeon: [name], MD;  Location: [facility] INVASIVE CV LAB;  Service: Cardiovascular;  Laterality: N/A;   ESOPHAGOGASTRODUODENOSCOPY  03/18/2024   per Dr. [name], normal   frozen shoulder release Right    GANGLION CYST EXCISION Right 1989   LEFT HEART CATH AND CORS/GRAFTS ANGIOGRAPHY N/A 10/27/2017   Procedure: LEFT HEART CATH AND CORS/GRAFTS ANGIOGRAPHY;  Surgeon: Sharps [name], MD;  Location: [name];  Service: Cardiovascular;  Laterality: N/A;   LEFT HEART CATH AND CORS/GRAFTS ANGIOGRAPHY N/A 12/14/2018   Procedure: LEFT HEART CATH AND CORS/GRAFTS ANGIOGRAPHY;  Surgeon: [name], MD;  Location: [facility] INVASIVE CV LAB;  Service: Cardiovascular;  Laterality: N/A;  TEE WITHOUT CARDIOVERSION N/A 11/22/2015   Procedure: TRANSESOPHAGEAL ECHOCARDIOGRAM (TEE);  Surgeon: [name], MD;  Location: [facility] OR;  Service: Open Heart Surgery;  Laterality: N/A;   WRIST GANGLION EXCISION Right    Patient Active Problem List   Diagnosis Date Noted   Fall 05/27/2024   Orthostatic hypotension 05/27/2024   Post-traumatic spasticity 05/27/2024   Coping style affecting medical condition 05/04/2024   Central cord syndrome (HCC) 04/21/2024   Acute incomplete quadriplegia (HCC) 04/21/2024   Shock (HCC) 04/15/2024   Varicose veins of both  lower extremities 07/03/2023   Full thickness rotator cuff tear 02/26/2023   Type 2 diabetes mellitus with diabetic polyneuropathy, without long-term current use of insulin  (HCC) 02/02/2023   Pancytopenia (HCC) 01/09/2023   OA (osteoarthritis) of knee 04/18/2022   Left shoulder pain 04/18/2022   GERD (gastroesophageal reflux disease)    Bacterial infection due to H. pylori 03/01/2020   Monoclonal gammopathy of unknown significance (MGUS) 03/01/2020   Iron  deficiency anemia 02/22/2020   Onychomycosis 02/08/2020   Rectal bleeding 01/24/2020   Indigestion 01/24/2020   Angina pectoris 12/12/2018   Erectile disorder due to medical condition in male 04/08/2018   Chronic combined systolic (congestive) and diastolic (congestive) heart failure (HCC) 07/29/2017   Pericardial effusion 12/24/2015   S/P CABG x 5 11/22/2015   Abnormal stress test 11/21/2015   Coronary artery disease of bypass graft of native heart with stable angina pectoris    Nonspecific abnormal electrocardiogram (ECG) (EKG) 11/07/2015   Essential hypertension 03/30/2011   Hypercholesterolemia 03/30/2011   Colon polyp 03/30/2011    ONSET DATE: 05/17/2024 (referral date)    REFERRING DIAG: G82.50 (ICD-10-CM) - Quadriplegia, unspecified S14.129A (ICD-10-CM) - Central cord syndrome, initial encounter (HCC)    THERAPY DIAG:  Other lack of coordination  Muscle weakness (generalized)  Quadriplegia, C1-C4 incomplete (HCC)  Other symptoms and signs involving the musculoskeletal system  Rationale for Evaluation and Treatment: Rehabilitation  SUBJECTIVE:   SUBJECTIVE STATEMENT: Pt arrived in wc, pt reports swelling and pain in R knee, had a fall last Friday. Reports decreased ability to close B hands Pt accompanied by: [name]   PERTINENT HISTORY: Presented 04/15/2024 after being on a roof cutting a tree branch when the tree fell on him pinning him against the roof. Pt underwent ACDF C6-7 on 04/19/24. No loss of  consciousness. PMH:  significant for diabetes mellitus, CAD with CABG 2017, hypertension, hyperlipidemia  IMPRESSION from MRI on 04/16/24: 1. No acute fracture or ligamentous injury of the cervical spine. 2. Focal hyperintense T2-weighted signal within the spinal cord at the C3-4 levels and at C7, which may indicate myelomalacia or spinal cord edema. 3. Severe spinal canal stenosis and bilateral neural foraminal stenosis at C6-7. 4. Mild spinal canal stenosis and severe bilateral neural foraminal stenosis at C3-4. 5. Moderate left C2-3, left C4-5 and bilateral C5-6 neural foraminal stenosis.  PRECAUTIONS: Cervical, Fall, and Other: soft collar, orthostatic hypotension (wears abdominal binder and TED hoses), lifting restrictions   WEIGHT BEARING RESTRICTIONS: ? UE wt bearing  PAIN:  Are you having pain? Yes: NPRS scale: 5/10 at rest at R knee, 7/10 when standing Pain location: R knee Pain description: stiffness  Aggravating factors: certain positions Relieving factors: changing positions, OTC meds and muscle relaxers  FALLS: Has patient fallen in last 6 months? Yes. 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  HAND DOMINANCE: Right  ADLs: Transfers/ambulation related to ADLs: rollator Eating: mod I using Rt hand mostly with adapted utensils, wife cuts food Grooming: mod I for brushing teeth/shaving using both hands, except wife brushes pt's hair UB Dressing:  min assist - improving LB Dressing: max assist for TED hose, fluctuating assist for pants/shoes Toileting: supervision and assist for wiping Bathing: assist w/ lower legs and back  Tub Shower transfers: min assist Equipment: Shower seat with back and Grab bars  IADLs:  Shopping: pt has gone with wife 2x since d/c from hospital Light housekeeping: dependent Meal Prep: dependent Community mobility: dependent Medication management: wife places in pillbox, wife assists Handwriting: 90% legible in print (pt reports signature was always bad)   MOBILITY STATUS: using rollator   UPPER EXTREMITY ROM:  RUE AROM at shoulder and elbow WFL's. Limited more distally with 75% wrist and 50% composite flexion  LUE shoulder movement limited in approx 45* scaption (lesser true flex), 75% ER, 90% IR, Elbows distally WFLs w/ difficulty w/ index PIP extension   UPPER EXTREMITY MMT:   not tested d/t precautions, current deficits and premorbid rotator cuff tears bilaterally    HAND FUNCTION: Grip strength: Right: 5.2 lbs; Left: 29.3 lbs  COORDINATION: 9 Hole Peg test: Right: placed 6 in 2 min;   Left: 117.83 sec Box and Blocks:  Right 31 blocks, Left 35 blocks  SENSATION: Light touch: WFL Pt reports some numbness in fingertips (premorbid but more pronounced since accident)   EDEMA: mild Rt hand, slightly worse in fingers Rt hand  MUSCLE TONE: pt has reported spasms and on muscle relaxer's (take at night)   COGNITION: Overall cognitive status: Within functional limits for tasks assessed  VISION: Subjective report: Pt reports no changes  Baseline vision: Bifocals Visual history: none    PERCEPTION: Not tested  PRAXIS: Not tested  OBSERVATIONS: bilateral hand deficits (Rt worse than Lt), bilateral shoulder deficits (Lt worse than Rt)  Premorbid rotator cuff tears but pt reports he had majority of ROM both shoulders prior to accident - now limited in ROM Lt shoulder                                                                                                                              TREATMENT DATE: 07/14/24 D/t pt having knee pain, focus in tx today on education of AE for improved independence with ADLs. Educated pt and spouse in use of sock aide, dressing stick, shoe horn, and long handled sponge, visual demo and verbal instruction provided. Both expressed understanding and interest in looking into these more, shown  where to obtain them and to ensure good ratings on [employer]. Pt also reported improved ease with self feeding with adaptive utensils with wide grip and ability to angle fork and spoon, however expresed continued difficulty with cutting food. Pt shown rocker knife and provided demo of use with green theraputty cutting into strips. Pt agreed to trial, successfully cut putty in strips and small pieces. Pt and spouse shown where to obtain these as well.   D/t having some more time left showed pt AAROM exernal rotation with cane/rod for modified shoulder HEP secondary to premorbid RTC tear. Pt demonstrated good understanding of this exercise. Next cut piece of red theraband and showed pt elbow flexion and extension, pt did require mod cues for proper placement of band for elbow extension to solely work on tricep rather than engage in horizontal abduction at shoulder, instructed to pull straight down rather at side.        PATIENT EDUCATION: Education details: Theraputty HEP and care for putty Person educated: Patient and Spouse Education method: Explanation, Verbal cues, and Handouts Education comprehension: verbalized understanding and returned demonstration  HOME EXERCISE PROGRAM: 07/04/24: yellow theraputty HEP (ACCESS CODE [id number]), coordination    GOALS: Goals reviewed with patient? Yes  SHORT TERM GOALS: Target date: 07/22/24  Pt independent with HEP for bilateral coordination and grip strength Baseline:  Goal status: IN PROGRESS  2.  Pt independent  with modified shoulder HEP (premorbid rotator cuff tear)  Baseline:  Goal status: IN PROGRESS  3.  Pt to consistently be mod I for donning/doffing overhead shirts and UB bathing w/ AE prn Baseline:  Goal status: IN PROGRESS  4.  Pt to perform LE dressing w/ min assist using AE prn (w/ exception to TED hoses)  Baseline: mod to max assist Goal status: INITIAL  5.  Grip strength to increase by 10 lbs or more on Rt side and 5 lbs or more on Lt side Baseline: 5.2 lbs, 29.3 lbs Goal status: INITIAL  6.  Improve BUE function as evidenced by increasing Box & Blocks by 5 or more blocks Baseline: Rt = 31, Lt = 35 Goal status: INITIAL  LONG TERM GOALS: Target date: 08/22/24  Independent with updated HEP  Baseline:  Goal status: INITIAL  2.  Pt to be mod I for all BADLS including brushing hair and LB dressing (exception TED hoses)  Baseline:  Goal status: INITIAL  3.  Improve Rt hand coordination as evidenced by reducing speed on 9 hole peg test in under 2 minutes Baseline:  Goal status: INITIAL  4.  Improve Lt hand coordination as evidenced by reducing speed on 9 hole peg test in under 2 minutes Baseline:  Goal status: INITIAL  5.  Pt to perform light IADLS in standing with countertop support w/o LOB  Baseline:  Goal status: INITIAL  6.  Pt to improve LUE shoulder ROM to 70* flexion or greater for mid level reaching Baseline:  Goal status: INITIAL  ASSESSMENT:  CLINICAL IMPRESSION: Patient is a 73 y.o. male who was seen today for occupational therapy tx for incomplete quadriplegia (central cord syndrome) from roofing accident on 04/15/24,  s/p ACDF on 04/19/24. Pt demonstrated good understanding of AE trialed, tx mainly focused on AE education d/t pt having pain and swelling in R knee and decreased ability to close hands, pt reported will be meeting with neurologist soon. Pt did also require mod cues for proper form of elbow extension theraband exercise to ensure working triceps  rather than  shoulders at this time. Pt would benefit from continued skilled services to continue to improve ability to complete ADLs and improve UE strength for functional transfers and to reduce caregiver burden. PERFORMANCE DEFICITS: in functional skills including ADLs, IADLs, coordination, dexterity, sensation, edema, ROM, strength, pain, flexibility, Fine motor control, Gross motor control, mobility, body mechanics, cardiopulmonary status limiting function, decreased knowledge of precautions, decreased knowledge of use of DME, and UE functional use.   IMPAIRMENTS: are limiting patient from ADLs, IADLs, rest and sleep, work, leisure, and social participation.   CO-MORBIDITIES: has co-morbidities such as orthostatic hypotension, bilateral rotator cuff tears that affects occupational performance. Patient will benefit from skilled OT to address above impairments and improve overall function.  MODIFICATION OR ASSISTANCE TO COMPLETE EVALUATION: Min-Moderate modification of tasks or assist with assess necessary to complete an evaluation.  OT OCCUPATIONAL PROFILE AND HISTORY: Detailed assessment: Review of records and additional review of physical, cognitive, psychosocial history related to current functional performance.  CLINICAL DECISION MAKING: Moderate - several treatment options, min-mod task modification necessary  REHAB POTENTIAL: Good  EVALUATION COMPLEXITY: Moderate    PLAN:  OT FREQUENCY: 2x/week  OT DURATION: 8 weeks  PLANNED INTERVENTIONS: 97535 self care/ADL training, 02889 therapeutic exercise, 97530 therapeutic activity, 97112 neuromuscular re-education, 97140 manual therapy, 97113 aquatic therapy, 97018 paraffin, 02960 fluidotherapy, 97010 moist heat, 97760 Orthotic Initial, 97763 Orthotic/Prosthetic subsequent, passive range of motion, functional mobility training, patient/family education, and DME and/or AE instructions  RECOMMENDED OTHER SERVICES: none at this  time  CONSULTED AND AGREED WITH PLAN OF CARE: Patient and family member/caregiver  PLAN FOR NEXT SESSION:  F/u on AE obtaining Modified shoulder HEP (premorbid rotator cuff tear) Monitor gross grasp    Rocky Dutch, OT 07/14/2024, 4:36 PM
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
## 2024-07-18 ENCOUNTER — Ambulatory Visit (HOSPITAL_COMMUNITY)
Admission: RE | Admit: 2024-07-18 | Discharge: 2024-07-18 | Disposition: A | Discharge status: Home / Self Care | Source: Ambulatory Visit | Attending: Cardiology | Admitting: Cardiology

## 2024-07-18 DIAGNOSIS — I5042 Chronic combined systolic (congestive) and diastolic (congestive) heart failure: Secondary | ICD-10-CM | POA: Insufficient documentation

## 2024-07-18 LAB — ECHOCARDIOGRAM COMPLETE
AR max vel: 3.29 cm2
AV Area VTI: 3.38 cm2
AV Area mean vel: 3.19 cm2
AV Mean grad: 2 mmHg
AV Peak grad: 4.2 mmHg
Ao pk vel: 1.03 m/s
Area-P 1/2: 3.65 cm2
S' Lateral: 2.9 cm

## 2024-07-19 ENCOUNTER — Encounter: Payer: Self-pay | Admitting: Occupational Therapy

## 2024-07-19 ENCOUNTER — Ambulatory Visit: Admitting: Occupational Therapy

## 2024-07-19 ENCOUNTER — Ambulatory Visit: Admitting: Physical Therapy

## 2024-07-19 ENCOUNTER — Encounter: Payer: Self-pay | Admitting: Physical Medicine and Rehabilitation

## 2024-07-19 DIAGNOSIS — R29898 Other symptoms and signs involving the musculoskeletal system: Secondary | ICD-10-CM | POA: Diagnosis not present

## 2024-07-19 DIAGNOSIS — R29818 Other symptoms and signs involving the nervous system: Secondary | ICD-10-CM

## 2024-07-19 DIAGNOSIS — R2689 Other abnormalities of gait and mobility: Secondary | ICD-10-CM | POA: Diagnosis not present

## 2024-07-19 DIAGNOSIS — G8252 Quadriplegia, C1-C4 incomplete: Secondary | ICD-10-CM | POA: Diagnosis not present

## 2024-07-19 DIAGNOSIS — R293 Abnormal posture: Secondary | ICD-10-CM

## 2024-07-19 DIAGNOSIS — R278 Other lack of coordination: Secondary | ICD-10-CM | POA: Diagnosis not present

## 2024-07-19 DIAGNOSIS — M6281 Muscle weakness (generalized): Secondary | ICD-10-CM

## 2024-07-19 DIAGNOSIS — R208 Other disturbances of skin sensation: Secondary | ICD-10-CM | POA: Diagnosis not present

## 2024-07-19 DIAGNOSIS — M62838 Other muscle spasm: Secondary | ICD-10-CM | POA: Diagnosis not present

## 2024-07-19 NOTE — Therapy (Signed)
 OUTPATIENT OCCUPATIONAL THERAPY NEURO TREATMENT  Patient Name: Jerome Vaughn MRN: [id number] DOB:1951/02/07, 73 y.o., male Today's Date: 07/19/2024  PCP: [name] LABOR, MD REFERRING PROVIDER: [name] PARAS, PA-C  END OF SESSION:  OT End of Session - 07/19/24 1448     Visit Number 4    Number of Visits 16    Date for Recertification  08/22/24    Authorization Type [facility] - form submitted    Authorization Time Period 06/22/24-08/29/24    Authorization - Visit Number 4    Authorization - Number of Visits 16    Progress Note Due on Visit 10    OT Start Time 1445    OT Stop Time 1540    OT Time Calculation (min) 55 min    Equipment Utilized During Treatment dressing stick, sock aide, long handled sponge, long handled shoe horn, rocker knife    Activity Tolerance Patient tolerated treatment well    Behavior During Therapy WFL for tasks assessed/performed            Past Medical History:  Diagnosis Date   Carpal tunnel syndrome    both hands   Colon polyps    Coronary artery disease    sees Dr. [name]   Diabetes mellitus    sees Dr. [name]   Diabetes mellitus [facility]) 2009   Ganglion cyst of wrist, right 1989   GERD (gastroesophageal reflux disease)    History of echocardiogram    a. Echo 4/17: Moderate LVH, EF 50-55%, mild LAE, no pericardial effusion   Hyperlipidemia    Hypertension    MGUS (monoclonal gammopathy of unknown significance)    sees Dr. [name]   Neuropathy    gets foot exams with Dr. [name] (podiatry)   Pneumonia    Rotator cuff injury    Past Surgical History:  Procedure Laterality Date   ANTERIOR CERVICAL DECOMP/DISCECTOMY FUSION N/A 04/19/2024   Procedure: ANTERIOR CERVICAL DECOMPRESSION/DISCECTOMY FUSION CERVICAL SIX-SEVEN;  Surgeon: [name] MATSU, MD;  Location: [facility] OR;  Service: Neurosurgery;  Laterality: N/A;  ACDF C67   CARDIAC CATHETERIZATION N/A 11/21/2015   Procedure: Left Heart Cath and Coronary  Angiography;  Surgeon: [name], MD;  Location: [facility] INVASIVE CV LAB;  Service: Cardiovascular;  Laterality: N/A;   COLONOSCOPY  03/18/2024   per Dr. [name], adenomatous polyp, repeat in 5 yrs   CORONARY ARTERY BYPASS GRAFT N/A 11/22/2015   Procedure: CORONARY ARTERY BYPASS GRAFTING (CABG) times five using the left internal mammary, right greater saphenous vein EVH, and left thigh greater saphenous vein EVH;  Surgeon: [name], MD;  Location: [facility] OR;  Service: Open Heart Surgery;  Laterality: N/A;   CORONARY ARTERY BYPASS GRAFT  2017   CORONARY PRESSURE/FFR STUDY N/A 12/14/2018   Procedure: INTRAVASCULAR PRESSURE WIRE/FFR STUDY;  Surgeon: [name], MD;  Location: [facility] INVASIVE CV LAB;  Service: Cardiovascular;  Laterality: N/A;   ESOPHAGOGASTRODUODENOSCOPY  03/18/2024   per Dr. [name], normal   frozen shoulder release Right    GANGLION CYST EXCISION Right 1989   LEFT HEART CATH AND CORS/GRAFTS ANGIOGRAPHY N/A 10/27/2017   Procedure: LEFT HEART CATH AND CORS/GRAFTS ANGIOGRAPHY;  Surgeon: Sharps [name], MD;  Location: [name];  Service: Cardiovascular;  Laterality: N/A;   LEFT HEART CATH AND CORS/GRAFTS ANGIOGRAPHY N/A 12/14/2018   Procedure: LEFT HEART CATH AND CORS/GRAFTS ANGIOGRAPHY;  Surgeon: [name], MD;  Location: [facility] INVAS[facility] CV LAB;  Service: Cardiovascular;  Laterality: N/A;  TEE WITHOUT CARDIOVERSION N/A 11/22/2015   Procedure: TRANSESOPHAGEAL ECHOCARDIOGRAM (TEE);  Surgeon: [name], MD;  Location: [facility] OR;  Service: Open Heart Surgery;  Laterality: N/A;   WRIST GANGLION EXCISION Right    Patient Active Problem List   Diagnosis Date Noted   Fall 05/27/2024   Orthostatic hypotension 05/27/2024   Post-traumatic spasticity 05/27/2024   Coping style affecting medical condition 05/04/2024   Central cord syndrome (HCC) 04/21/2024   Acute incomplete quadriplegia (HCC) 04/21/2024   Shock (HCC) 04/15/2024   Varicose veins of both  lower extremities 07/03/2023   Full thickness rotator cuff tear 02/26/2023   Type 2 diabetes mellitus with diabetic polyneuropathy, without long-term current use of insulin  (HCC) 02/02/2023   Pancytopenia (HCC) 01/09/2023   OA (osteoarthritis) of knee 04/18/2022   Left shoulder pain 04/18/2022   GERD (gastroesophageal reflux disease)    Bacterial infection due to H. pylori 03/01/2020   Monoclonal gammopathy of unknown significance (MGUS) 03/01/2020   Iron  deficiency anemia 02/22/2020   Onychomycosis 02/08/2020   Rectal bleeding 01/24/2020   Indigestion 01/24/2020   Angina pectoris 12/12/2018   Erectile disorder due to medical condition in male 04/08/2018   Chronic combined systolic (congestive) and diastolic (congestive) heart failure (HCC) 07/29/2017   Pericardial effusion 12/24/2015   S/P CABG x 5 11/22/2015   Abnormal stress test 11/21/2015   Coronary artery disease of bypass graft of native heart with stable angina pectoris    Nonspecific abnormal electrocardiogram (ECG) (EKG) 11/07/2015   Essential hypertension 03/30/2011   Hypercholesterolemia 03/30/2011   Colon polyp 03/30/2011    ONSET DATE: 05/17/2024 (referral date)    REFERRING DIAG: G82.50 (ICD-10-CM) - Quadriplegia, unspecified S14.129A (ICD-10-CM) - Central cord syndrome, initial encounter (HCC)    THERAPY DIAG:  Other lack of coordination  Muscle weakness (generalized)  Other symptoms and signs involving the nervous system  Other symptoms and signs involving the musculoskeletal system  Rationale for Evaluation and Treatment: Rehabilitation  SUBJECTIVE:   SUBJECTIVE STATEMENT: My Rt shoulder is giving me more problems now than my Lt now. We haven't gotten the rocker knife yet Pt accompanied by: [name]   PERTINENT HISTORY: Presented 04/15/2024 after being on a roof cutting a tree branch when the tree fell on him pinning him against the roof. Pt underwent ACDF C6-7 on 04/19/24. No loss of  consciousness. PMH:  significant for diabetes mellitus, CAD with CABG 2017, hypertension, hyperlipidemia  IMPRESSION from MRI on 04/16/24: 1. No acute fracture or ligamentous injury of the cervical spine. 2. Focal hyperintense T2-weighted signal within the spinal cord at the C3-4 levels and at C7, which may indicate myelomalacia or spinal cord edema. 3. Severe spinal canal stenosis and bilateral neural foraminal stenosis at C6-7. 4. Mild spinal canal stenosis and severe bilateral neural foraminal stenosis at C3-4. 5. Moderate left C2-3, left C4-5 and bilateral C5-6 neural foraminal stenosis.  PRECAUTIONS: Cervical, Fall, and Other: soft collar, orthostatic hypotension (wears abdominal binder and TED hoses), lifting restrictions   WEIGHT BEARING RESTRICTIONS: ? UE wt bearing  PAIN:  Are you having pain? Yes: NPRS scale: 5/10 at rest at R knee, 7/10 when standing Pain location: R knee Pain description: stiffness  Aggravating factors: certain positions Relieving factors: changing positions, OTC meds and muscle relaxers  FALLS: Has patient fallen in last 6 months? Yes. 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  HAND DOMINANCE: Right  ADLs: Transfers/ambulation related to ADLs: rollator Eating: mod I using Rt hand mostly with adapted utensils, wife cuts food Grooming: mod I for brushing teeth/shaving using both hands, except wife brushes pt's hair UB Dressing:  min assist - improving LB Dressing: max assist for TED hose, fluctuating assist for pants/shoes Toileting: supervision and assist for wiping Bathing: assist w/ lower legs and back  Tub Shower transfers: min assist Equipment: Shower seat with back and Grab bars  IADLs:  Shopping: pt has gone with wife 2x since d/c from hospital Light housekeeping: dependent Meal Prep: dependent Community mobility: dependent Medication management: wife places in pillbox, wife assists Handwriting: 90% legible in print (pt reports signature was always bad)   MOBILITY STATUS: using rollator   UPPER EXTREMITY ROM:  RUE AROM at shoulder and elbow WFL's. Limited more distally with 75% wrist and 50% composite flexion  LUE shoulder movement limited in approx 45* scaption (lesser true flex), 75% ER, 90% IR, Elbows distally WFLs w/ difficulty w/ index PIP extension   UPPER EXTREMITY MMT:   not tested d/t precautions, current deficits and premorbid rotator cuff tears bilaterally    HAND FUNCTION: Grip strength: Right: 5.2 lbs; Left: 29.3 lbs  COORDINATION: 9 Hole Peg test: Right: placed 6 in 2 min;   Left: 117.83 sec Box and Blocks:  Right 31 blocks, Left 35 blocks  SENSATION: Light touch: WFL Pt reports some numbness in fingertips (premorbid but more pronounced since accident)   EDEMA: mild Rt hand, slightly worse in fingers Rt hand  MUSCLE TONE: pt has reported spasms and on muscle relaxer's (take at night)   COGNITION: Overall cognitive status: Within functional limits for tasks assessed  VISION: Subjective report: Pt reports no changes  Baseline vision: Bifocals Visual history: none    PERCEPTION: Not tested  PRAXIS: Not tested  OBSERVATIONS: bilateral hand deficits (Rt worse than Lt), bilateral shoulder deficits (Lt worse than Rt)  Premorbid rotator cuff tears but pt reports he had majority of ROM both shoulders prior to accident - now limited in ROM Lt shoulder                                                                                                                              TREATMENT DATE: 07/19/24   Pt issued coordination HEP for bilateral hands, however adapted some prn for Rt hand due to decreased coordination Rt hand compared to Lt - see pt instructions for details.   Pt also encouraged to practice eating and writing with Rt hand. Pt practiced writing name in print and cursive  w/ regular and built up pen (tan foam) however pt appeared to do better with regular pen. Pt reports he has a gel pen at home he likes  Pt also issued shoulder HEP for bilateral shoulders w/  modifications prn d/t premorbid bilateral RTC tears. See pt instructions for details. Min cueing provided to ensure proper movement. Pt had no pain, but did report stretch in biceps. Pt instructed that these ex's should not be painful, only a stretch. Pt instructed to stop if painful.   Assessed Rt hand - pt may benefit from resting hand splint for pm to avoid potential clawing and to prevent MP stiffness. Will continue to monitor      PATIENT EDUCATION: Education details: Theraputty HEP and care for putty Person educated: Patient and Spouse Education method: Explanation, Verbal cues, and Handouts Education comprehension: verbalized understanding and returned demonstration  HOME EXERCISE PROGRAM: 07/04/24: yellow theraputty HEP (ACCESS CODE [id number]), coordination    GOALS: Goals reviewed with patient? Yes  SHORT TERM GOALS: Target date: 07/22/24  Pt independent with HEP for bilateral coordination and grip strength Baseline:  Goal status: IN PROGRESS  2.  Pt independent with modified shoulder HEP (premorbid rotator cuff tear)  Baseline:  Goal status: IN PROGRESS  3.  Pt to consistently be mod I for donning/doffing overhead shirts and UB bathing w/ AE prn Baseline:  Goal status: IN PROGRESS  4.  Pt to perform LE dressing w/ min assist using AE prn (w/ exception to TED hoses)   Baseline: mod to max assist Goal status: INITIAL  5.  Grip strength to increase by 10 lbs or more on Rt side and 5 lbs or more on Lt side Baseline: 5.2 lbs, 29.3 lbs Goal status: INITIAL  6.  Improve BUE function as evidenced by increasing Box & Blocks by 5 or more blocks Baseline: Rt = 31, Lt = 35 Goal status: INITIAL  LONG TERM GOALS: Target date: 08/22/24  Independent with updated HEP  Baseline:  Goal status: INITIAL  2.  Pt to be mod I for all BADLS including brushing hair and LB dressing (exception TED hoses)  Baseline:  Goal status: INITIAL  3.  Improve Rt hand coordination as evidenced by reducing speed on 9 hole peg test in under 2 minutes Baseline:  Goal status: INITIAL  4.  Improve Lt hand coordination as evidenced by reducing speed on 9 hole peg test in under 2 minutes Baseline:  Goal status: INITIAL  5.  Pt to perform light IADLS in standing with countertop support w/o LOB  Baseline:  Goal status: INITIAL  6.  Pt to improve LUE shoulder ROM to 70* flexion or greater for mid level reaching Baseline:  Goal status: INITIAL  ASSESSMENT:  CLINICAL IMPRESSION: Patient seen today for occupational therapy tx for incomplete quadriplegia (central cord syndrome) from roofing accident on 04/15/24,  s/p ACDF on 04/19/24. Pt demonstrated good understanding of HEP's for coordination and shoulders. Pt did also require min cues for proper form. Pt would benefit from continued skilled services to continue to improve ability to complete ADLs and improve UE strength for functional transfers and to reduce caregiver burden. PERFORMANCE DEFICITS: in functional skills including ADLs, IADLs, coordination, dexterity, sensation, edema, ROM, strength, pain, flexibility, Fine motor control, Gross motor control, mobility, body mechanics, cardiopulmonary status limiting function, decreased knowledge of precautions, decreased knowledge of use of DME, and UE functional use.   IMPAIRMENTS: are  limiting patient from ADLs, IADLs, rest and sleep, work, leisure, and social  participation.   CO-MORBIDITIES: has co-morbidities such as orthostatic hypotension, bilateral rotator cuff tears that affects occupational performance. Patient will benefit from skilled OT to address above impairments and improve overall function.  MODIFICATION OR ASSISTANCE TO COMPLETE EVALUATION: Min-Moderate modification of tasks or assist with assess necessary to complete an evaluation.  OT OCCUPATIONAL PROFILE AND HISTORY: Detailed assessment: Review of records and additional review of physical, cognitive, psychosocial history related to current functional performance.  CLINICAL DECISION MAKING: Moderate - several treatment options, min-mod task modification necessary  REHAB POTENTIAL: Good  EVALUATION COMPLEXITY: Moderate    PLAN:  OT FREQUENCY: 2x/week  OT DURATION: 8 weeks  PLANNED INTERVENTIONS: 97535 self care/ADL training, 02889 therapeutic exercise, 97530 therapeutic activity, 97112 neuromuscular re-education, 97140 manual therapy, 97113 aquatic therapy, 97018 paraffin, 02960 fluidotherapy, 97010 moist heat, 97760 Orthotic Initial, 97763 Orthotic/Prosthetic subsequent, passive range of motion, functional mobility training, patient/family education, and DME and/or AE instructions  RECOMMENDED OTHER SERVICES: none at this time  CONSULTED AND AGREED WITH PLAN OF CARE: Patient and family member/caregiver  PLAN FOR NEXT SESSION:  F/u on AE obtaining Review coordination and sh HEP  Monitor gross grasp and need for Rt resting hand splint    Burnard JINNY Roads, OT 07/19/2024, 3:45 PM

## 2024-07-19 NOTE — Patient Instructions (Addendum)
  Coordination Activities  Perform the following activities for 15 minutes 2 times per Golphin with both hand(s).  Rotate ball in fingertips (clockwise and counter-clockwise). May need to flip ball over with Rt hand Flip cards over 1 at a time  Deal cards with your thumb (Hold deck in hand and push card off top with thumb) with Lt hand only. Rotate card in hand (clockwise and counter-clockwise). Pick up coins and place in container or coin bank. Pick up larger coins one at a time until you get 5 in your hand, then move coins from palm to fingertips to stack one at a time Lt hand.  Use checkers (3 at a time) for Rt hand Twirl pen between fingers.  Practice writing and eating with Rt hand only  Cranial Flexion: Overhead Arm Extension - Supine ([facility] [place])    Lie with knees bent, arms beyond head, holding paper towel roll or shoe box (palms facing). Pull ball up to above face. Repeat _10___ times per set. Do __2__ sets per Hipp.   2. Hold cane with palms up, elbows bent at 90 degrees, rotate cane out and in w/ forearms only, keep elbows glued to side. Repeat x 10 reps to each side. Do 2 sets per Kassebaum  3. Hold cane palms down both hands, reach towards floor, move cane side to side x 10, then in circles clockwise and counterclockwise x 10. Do 2 sessions per Tabar

## 2024-07-19 NOTE — Therapy (Signed)
 OUTPATIENT PHYSICAL THERAPY NEURO TREATMENT   Patient Name: Jerome Vaughn MRN: [id number] DOB:May 06, 1951, 73 y.o., male Today's Date: 07/19/2024   PCP: Jerome Vaughn LABOR, MD REFERRING PROVIDER: Pegge Vaughn PARAS, PA-C  END OF SESSION:  PT End of Session - 07/19/24 1414     Visit Number 4    Number of Visits 17   16+eval (1-2x/wk freq)   Date for Recertification  09/02/24    Authorization Type [employer]    Progress Note Due on Visit 10    PT Start Time 1412   pt arrived late   PT Stop Time 1445    PT Time Calculation (min) 33 min    Equipment Utilized During Treatment Gait belt    Activity Tolerance Patient tolerated treatment well    Behavior During Therapy WFL for tasks assessed/performed            Past Medical History:  Diagnosis Date   Carpal tunnel syndrome    both hands   Colon polyps    Coronary artery disease    sees Dr. [name]   Diabetes mellitus    sees Dr. Stefano Vaughn   Diabetes mellitus [facility]) 2009   Ganglion cyst of wrist, right 1989   GERD (gastroesophageal reflux disease)    History of echocardiogram    a. Echo 4/17: Moderate LVH, EF 50-55%, mild LAE, no pericardial effusion   Hyperlipidemia    Hypertension    MGUS (monoclonal gammopathy of unknown significance)    sees Dr. Norleen Vaughn   Neuropathy    gets foot exams with Dr. Thresa Vaughn (podiatry)   Pneumonia    Rotator cuff injury    Past Surgical History:  Procedure Laterality Date   ANTERIOR CERVICAL DECOMP/DISCECTOMY FUSION N/A 04/19/2024   Procedure: ANTERIOR CERVICAL DECOMPRESSION/DISCECTOMY FUSION CERVICAL SIX-SEVEN;  Surgeon: Jerome Dorn MATSU, MD;  Location: [facility] OR;  Service: Neurosurgery;  Laterality: N/A;  ACDF C67   CARDIAC CATHETERIZATION N/A 11/21/2015   Procedure: Left Heart Cath and Coronary Angiography;  Surgeon: Jerome Vaughn Sharps, MD;  Location: [facility] INVASIVE CV LAB;  Service: Cardiovascular;  Laterality: N/A;   COLONOSCOPY  03/18/2024   per Dr. [name], adenomatous  polyp, repeat in 5 yrs   CORONARY ARTERY BYPASS GRAFT N/A 11/22/2015   Procedure: CORONARY ARTERY BYPASS GRAFTING (CABG) times five using the left internal mammary, right greater saphenous vein EVH, and left thigh greater saphenous vein EVH;  Surgeon: Jerome Fleeta Ochoa, MD;  Location: [facility] OR;  Service: Open Heart Surgery;  Laterality: N/A;   CORONARY ARTERY BYPASS GRAFT  2017   CORONARY PRESSURE/FFR STUDY N/A 12/14/2018   Procedure: INTRAVASCULAR PRESSURE WIRE/FFR STUDY;  Surgeon: Jerome Candyce RAMAN, MD;  Location: [facility] INVASIVE CV LAB;  Service: Cardiovascular;  Laterality: N/A;   ESOPHAGOGASTRODUODENOSCOPY  03/18/2024   per Dr. [name], normal   frozen shoulder release Right    GANGLION CYST EXCISION Right 1989   LEFT HEART CATH AND CORS/GRAFTS ANGIOGRAPHY N/A 10/27/2017   Procedure: LEFT HEART CATH AND CORS/GRAFTS ANGIOGRAPHY;  Surgeon: Vaughn Jerome LELON, MD;  Location: [name];  Service: Cardiovascular;  Laterality: N/A;   LEFT HEART CATH AND CORS/GRAFTS ANGIOGRAPHY N/A 12/14/2018   Procedure: LEFT HEART CATH AND CORS/GRAFTS ANGIOGRAPHY;  Surgeon: Jerome Candyce RAMAN, MD;  Location: [facility] INVASIVE CV LAB;  Service: Cardiovascular;  Laterality: N/A;   TEE WITHOUT CARDIOVERSION N/A 11/22/2015   Procedure: TRANSESOPHAGEAL ECHOCARDIOGRAM (TEE);  Surgeon: Jerome Fleeta Ochoa, MD;  Location: [facility] OR;  Service: Open Heart Surgery;  Laterality: N/A;   WRIST GANGLION EXCISION Right    Patient Active Problem List   Diagnosis Date Noted   Fall 05/27/2024   Orthostatic hypotension 05/27/2024   Post-traumatic spasticity 05/27/2024   Coping style affecting medical condition 05/04/2024   Central cord syndrome (HCC) 04/21/2024   Acute incomplete quadriplegia (HCC) 04/21/2024   Shock (HCC) 04/15/2024   Varicose veins of both lower extremities 07/03/2023   Full thickness rotator cuff tear 02/26/2023   Type 2 diabetes mellitus with diabetic polyneuropathy, without long-term current use of insulin  (HCC)  02/02/2023   Pancytopenia (HCC) 01/09/2023   OA (osteoarthritis) of knee 04/18/2022   Left shoulder pain 04/18/2022   GERD (gastroesophageal reflux disease)    Bacterial infection due to H. pylori 03/01/2020   Monoclonal gammopathy of unknown significance (MGUS) 03/01/2020   Iron  deficiency anemia 02/22/2020   Onychomycosis 02/08/2020   Rectal bleeding 01/24/2020   Indigestion 01/24/2020   Angina pectoris 12/12/2018   Erectile disorder due to medical condition in male 04/08/2018   Chronic combined systolic (congestive) and diastolic (congestive) heart failure (HCC) 07/29/2017   Pericardial effusion 12/24/2015   S/P CABG x 5 11/22/2015   Abnormal stress test 11/21/2015   Coronary artery disease of bypass graft of native heart with stable angina pectoris    Nonspecific abnormal electrocardiogram (ECG) (EKG) 11/07/2015   Essential hypertension 03/30/2011   Hypercholesterolemia 03/30/2011   Colon polyp 03/30/2011    ONSET DATE: 04/15/2024 (date of injury and hospital admission)  REFERRING DIAG: G82.50 (ICD-10-CM) - Quadriplegia, unspecified  THERAPY DIAG:  Muscle weakness (generalized)  Other symptoms and signs involving the musculoskeletal system  Other symptoms and signs involving the nervous system  Abnormal posture  Other abnormalities of gait and mobility  Rationale for Evaluation and Treatment: Rehabilitation  SUBJECTIVE:                                                                                                                                                                                             SUBJECTIVE STATEMENT:  Pt reports some stiffness in his R shoulder, continues to have pain in his R knee. Pt scheduled to get a cortisone shot in his R knee tomorrow. His knee was pretty good over the weekend, feeling more stiff and sore today. Pt also wearing a knee brace on his R knee today.  Pt accompanied by: significant other - Wife Jerome Vaughn  PERTINENT HISTORY:  DM, mild neurogenic B/B, CAD/CABG 2017, severe orthostatic hypotension, combined CHF, bilateral torn RTC, s/p C6-7 ACDF including discectomy for decompression of spinal cord and exiting nerve roots with foraminotomies 04/19/2024 per Dr. 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 Yes: NPRS scale: 7/10 Pain location: R knee, headache Pain description: ache and intermittent throbbing (knee)  Aggravating factors: weight bearing (knee); intensified pain in the knee (for headache) Relieving factors: have tried heat w/ mild + response as well as advil topical - uses on shoulders as well  PRECAUTIONS: Cervical, Fall, and Other: pt reports he is to wear soft cervical collar as needed - he prefers it for comfort currently; last available order states OOB AAT - will clarify w/ Dr. Cornelio  RED FLAGS: Bowel or bladder incontinence: Yes: neurogenic B/B   WEIGHT BEARING RESTRICTIONS: No  FALLS: Has patient fallen in last 6 months? Yes. Number of falls 2 - see eval subjective  LIVING ENVIRONMENT: Lives with: lives with their spouse and adult grandchild Lives in: House/apartment Stairs: Yes: External: 3 steps; can reach both Has following equipment at home: Walker - 4 wheeled, shower chair, bed side commode, Grab bars, and transport  PLOF: Needs assistance with ADLs, Needs assistance with homemaking, Needs assistance with gait, and Needs assistance with transfers  PATIENT GOALS: to work on pain and walking  OBJECTIVE:  Note: Objective measures were completed at Evaluation unless otherwise noted.  DIAGNOSTIC FINDINGS:  Cervical MRI 04/16/2024: IMPRESSION: 1. No acute fracture or ligamentous injury of the cervical spine. 2. Focal hyperintense T2-weighted signal within the spinal cord at the C3-4 levels and at C7, which may indicate myelomalacia or spinal cord edema. 3.  Severe spinal canal stenosis and bilateral neural foraminal stenosis at C6-7. 4. Mild spinal canal stenosis and severe bilateral neural foraminal stenosis at C3-4. 5. Moderate left C2-3, left C4-5 and bilateral C5-6 neural foraminal stenosis.  COGNITION: Overall cognitive status: Within functional limits for tasks assessed   SENSATION: Light touch: impaired knee to ankle bilaterally  COORDINATION: BLE RAMS:  WNL Bilateral Heel-to-shin:  limited by functional weakness of hips  EDEMA:  Bilateral LE mild non-pitting edema at baseline prior to incident - wears TED hose for orthostatic management.  MUSCLE TONE: LLE: Mild, Hypertonic, and Modifed Ashworth Scale 1+ = Slight increase in muscle tone, manifested by a catch, followed by minimal resistance throughout the remainder (less than half) of the ROM  POSTURE: posterior pelvic tilt and upper trunk rigidity - limited assessment by abdominal binder and soft cervical collar  LOWER EXTREMITY ROM:     Active  Right Eval Left Eval  Hip flexion Grossly WNL  Hip extension   Hip abduction   Hip adduction   Hip internal rotation   Hip external rotation   Knee flexion   Knee extension   Ankle dorsiflexion   Ankle plantarflexion    Ankle inversion    Ankle eversion     (Blank rows = not tested)  LOWER EXTREMITY MMT:    MMT Right Eval Left Eval  Hip flexion 4 4  Hip extension    Hip abduction    Hip adduction    Hip internal rotation    Hip external rotation    Knee flexion    Knee extension 4+ 4+  Ankle dorsiflexion 4- 4-  Ankle plantarflexion    Ankle inversion    Ankle eversion    (Blank rows = not tested)  BED MOBILITY:  Findings: Sit to supine Modified independence Supine to sit Modified independence Rolling to Right Modified independence Rolling to Left Modified independence Pt reports  using insertable bed rails for independence.  TRANSFERS: Sit to stand: SBA  Assistive device utilized: Environmental consultant - 4 wheeled      Stand to sit: SBA  Assistive device utilized: Environmental consultant - 4 wheeled     Chair to chair: SBA  Assistive device utilized: Environmental consultant - 4 wheeled       RAMP:  Not tested  CURB:  Not tested  STAIRS: Not tested GAIT: Findings: Gait Characteristics: step through pattern, decreased stride length, decreased hip/knee flexion- Right, decreased hip/knee flexion- Left, decreased trunk rotation, trunk flexed, and narrow BOS, Distance walked: various clinic distances, Assistive device utilized:Walker - 4 wheeled, Level of assistance: SBA, and Comments: No overt LOB, crossover stepping, ataxia or marked pathway deviation.  Slow pace.  FUNCTIONAL TESTS:  5 times sit to stand: 23.03 sec intermittent hand support on rollator, pt denies dizziness or lightheadedness Timed up and go (TUG): TBA 2 minute walk test: TBA Berg Balance Scale:  TBA    PATIENT SURVEYS:  None completed due to time.                                                                                                                              TREATMENT DATE: 07/19/2024   TherAct NuStep level 3 for 8 minutes using BUE/BLEs for neural priming for reciprocal movement, dynamic cardiovascular warmup and increased amplitude of stepping. RPE of 3/10 following activity  In // bars with progression from BUE support to BUE fingertip support to UUE fingertip support: Lateral sidestepping with alt L/R gumdrop taps 3 x 10 ft L/R Alt L/R gumdrop taps while standing on airex 2 x 10 reps L/R   Gait pattern: decreased hip/knee flexion- Right and decreased hip/knee flexion- Left Distance walked: various clinic distances Assistive device utilized: Walker - 4 wheeled Level of assistance: CGA Comments: good safety with management of rollator brakes, upright posture    PATIENT EDUCATION: Education details:  Continue HEP Person educated: Patient and Spouse Education method: Medical illustrator Education comprehension: verbalized  understanding, returned demonstration, and needs further education  HOME EXERCISE PROGRAM: Access Code: [email or web address] URL: [email or web address] Date: 07/04/2024 Prepared by: Daved Bull  Exercises - Supine March  - 1 x daily - 7 x weekly - 3 sets - 10 reps - Supine Posterior Pelvic Tilt  - 1 x daily - 7 x weekly - 3 sets - 10 reps - Supine Ankle Pumps  - 1 x daily - 7 x weekly - 3 sets - 10 reps - Supine Heel Slide  - 1 x daily - 7 x weekly - 3 sets - 10 reps - Side Stepping with Resistance at Thighs and Counter Support  - 1 x daily - 5 x weekly - 3 sets - 10 reps - Forward Backward Monster Walk with Band at Thighs and Counter Support  - 1 x daily - 5 x weekly - 3 sets - 10 reps  Can try slow STS at home and daily  walks in home w/ supervision 2-5 minutes on days where BP is better.  GOALS: Goals reviewed with patient? Yes  SHORT TERM GOALS: Target date: 07/22/2024  Pt will be independent and compliant with introductory strength and balance focused HEP in order to maintain functional progress and improve mobility. Baseline:  Initiated on eval. Goal status: INITIAL  2.  Pt will decrease 5xSTS to </=18.03 seconds in order to demonstrate decreased risk for falls and improved functional bilateral LE strength and power. Baseline: 23.03 sec w/ int hand support Goal status: INITIAL  3.  TUG to be assessed w/ STG set as appropriate. Baseline: 15.81 sec w/ rollator SBA (10/6) Goal status: REVISED - LTG only  4.  Pt will ambulate>/=396 feet on to demonstrate improved endurance for functional tasks in home and community. Baseline: 47' w/ rollator SBA (10/6) Goal status: INITIAL  5.  Pt will increase BERG balance score to >/=45/56 to demonstrate improved static balance. Baseline: 40/56 (10/6) Goal status: INITIAL  LONG TERM GOALS: Target date: 08/19/2024  Pt will be independent and compliant with advanced and finalized strength and balance focused HEP in order to  maintain functional progress and improve mobility. Baseline:  Goal status: INITIAL  2.  Pt will decrease 5xSTS to </=13.03 seconds in order to demonstrate decreased risk for falls and improved functional bilateral LE strength and power. Baseline: 23.03 sec w/ int hand support Goal status: INITIAL  3.  Pt will demonstrate TUG of </=12 seconds in order to decrease risk of falls and improve functional mobility using LRAD. Baseline: 15.81 sec w/ rollator SBA (10/6) Goal status: INITIAL  4.  Pt will ambulate>/=496 feet on to demonstrate improved endurance for functional tasks in home and community. Baseline: 43' w/ rollator SBA (10/6) Goal status: INITIAL  5.  Pt will increase BERG balance score to >/=50/56 to demonstrate improved static balance. Baseline: 40/56 (10/6) Goal status: INITIAL  6.  Pt will appropriately wean from soft cervical collar to improve postural awareness, ROM, and visual scanning of environment needed for upright stability. Baseline: Following up w/ managing MD Goal status: INITIAL  ASSESSMENT:  CLINICAL IMPRESSION: Session limited by patient's late arrival. Emphasis of skilled PT session on working on global endurance training with focus on not flaring up R knee pain and on SLS stability and dynamic balance. Pt's biggest limitation remains his R knee pain, going to receive a cortisone injection tomorrow which should hopefully help with pain management. He continues to benefit from skilled PT services to work on improving his balance and increasing his safety and independence with functional mobility. Continue POC.   OBJECTIVE IMPAIRMENTS: Abnormal gait, decreased activity tolerance, decreased balance, decreased coordination, decreased endurance, decreased mobility, difficulty walking, decreased strength, increased edema, impaired sensation, impaired tone, impaired UE functional use, improper body mechanics, postural dysfunction, and pain.   ACTIVITY  LIMITATIONS: carrying, lifting, bending, standing, squatting, stairs, transfers, bathing, toileting, dressing, reach over head, and locomotion level  PARTICIPATION LIMITATIONS: meal prep, cleaning, laundry, medication management, driving, shopping, and community activity  PERSONAL FACTORS: Age, Fitness, Past/current experiences, Time since onset of injury/illness/exacerbation, and 1-2 comorbidities: bilateral torn RTC, severe orthostatic hypotension are also affecting patient's functional outcome.   REHAB POTENTIAL: Good  CLINICAL DECISION MAKING: Evolving/moderate complexity  EVALUATION COMPLEXITY: Moderate  PLAN:  PT FREQUENCY: 1-2x/week  PT DURATION: 8 weeks  PLANNED INTERVENTIONS: [id number]- PT Re-evaluation, 97750- Physical Performance Testing, 97110-Therapeutic exercises, 97530- Therapeutic activity, [id number]- Neuromuscular re-education, 97535- Self Care, [id number]- Manual therapy, [id number]- Gait training,  02239- Orthotic Initial, [id number]- Orthotic/Prosthetic subsequent, [id number]- Aquatic Therapy, [phone]- Electrical stimulation (manual), Patient/Family education, Balance training, Stair training, Taping, Joint mobilization, Vestibular training, DME instructions, Cryotherapy, and Moist heat  PLAN FOR NEXT SESSION: CHECK BP!  How is HEP?  Expand HEP for dynamic balance and strength.  SciFit for endurance.  Bilateral shoulder pain management as needed.  Gait training.  L NMR/tone management.  How is R knee edema/pain - can he tolerate standing?  Can consider aquatics if pt able to make it 2x/wk? - may consider for knee pain!  Waddell Southgate, PT Waddell Southgate, PT, DPT, CSRS  07/19/2024, 2:49 PM

## 2024-07-20 ENCOUNTER — Ambulatory Visit: Payer: Self-pay | Admitting: Physician Assistant

## 2024-07-20 DIAGNOSIS — M1711 Unilateral primary osteoarthritis, right knee: Secondary | ICD-10-CM | POA: Diagnosis not present

## 2024-07-21 ENCOUNTER — Other Ambulatory Visit (HOSPITAL_COMMUNITY): Payer: Self-pay

## 2024-07-21 ENCOUNTER — Ambulatory Visit: Admitting: Physical Therapy

## 2024-07-21 ENCOUNTER — Ambulatory Visit

## 2024-07-21 ENCOUNTER — Other Ambulatory Visit: Payer: Self-pay

## 2024-07-21 VITALS — BP 142/80 | HR 97

## 2024-07-21 DIAGNOSIS — R29898 Other symptoms and signs involving the musculoskeletal system: Secondary | ICD-10-CM

## 2024-07-21 DIAGNOSIS — R278 Other lack of coordination: Secondary | ICD-10-CM

## 2024-07-21 DIAGNOSIS — R293 Abnormal posture: Secondary | ICD-10-CM | POA: Diagnosis not present

## 2024-07-21 DIAGNOSIS — M6281 Muscle weakness (generalized): Secondary | ICD-10-CM

## 2024-07-21 DIAGNOSIS — G8252 Quadriplegia, C1-C4 incomplete: Secondary | ICD-10-CM

## 2024-07-21 DIAGNOSIS — M62838 Other muscle spasm: Secondary | ICD-10-CM | POA: Diagnosis not present

## 2024-07-21 DIAGNOSIS — R2689 Other abnormalities of gait and mobility: Secondary | ICD-10-CM

## 2024-07-21 DIAGNOSIS — R208 Other disturbances of skin sensation: Secondary | ICD-10-CM | POA: Diagnosis not present

## 2024-07-21 DIAGNOSIS — R41840 Attention and concentration deficit: Secondary | ICD-10-CM

## 2024-07-21 DIAGNOSIS — R29818 Other symptoms and signs involving the nervous system: Secondary | ICD-10-CM

## 2024-07-21 NOTE — Therapy (Signed)
 OUTPATIENT OCCUPATIONAL THERAPY NEURO TREATMENT  Patient Name: Jerome Vaughn MRN: [id number] DOB:1951/05/08, 73 y.o., male Today's Date: 07/21/2024  PCP: [name] LABOR, MD REFERRING PROVIDER: [name] PARAS, PA-C  END OF SESSION:  OT End of Session - 07/21/24 1631     Visit Number 5    Number of Visits 16    Date for Recertification  08/22/24    Authorization Type [facility] - form submitted    Authorization Time Period 06/22/24-08/29/24    Authorization - Visit Number 5    Authorization - Number of Visits 16    Progress Note Due on Visit 10    OT Start Time 1445    OT Stop Time 1528    OT Time Calculation (min) 43 min    Equipment Utilized During Treatment rocker knife, green putty, testing materials    Activity Tolerance Patient tolerated treatment well    Behavior During Therapy WFL for tasks assessed/performed             Past Medical History:  Diagnosis Date   Carpal tunnel syndrome    both hands   Colon polyps    Coronary artery disease    sees Dr. [name]   Diabetes mellitus    sees Dr. [name]   Diabetes mellitus [facility]) 2009   Ganglion cyst of wrist, right 1989   GERD (gastroesophageal reflux disease)    History of echocardiogram    a. Echo 4/17: Moderate LVH, EF 50-55%, mild LAE, no pericardial effusion   Hyperlipidemia    Hypertension    MGUS (monoclonal gammopathy of unknown significance)    sees Dr. [name]   Neuropathy    gets foot exams with Dr. [name] (podiatry)   Pneumonia    Rotator cuff injury    Past Surgical History:  Procedure Laterality Date   ANTERIOR CERVICAL DECOMP/DISCECTOMY FUSION N/A 04/19/2024   Procedure: ANTERIOR CERVICAL DECOMPRESSION/DISCECTOMY FUSION CERVICAL SIX-SEVEN;  Surgeon: [name] MATSU, MD;  Location: [facility] OR;  Service: Neurosurgery;  Laterality: N/A;  ACDF C67   CARDIAC CATHETERIZATION N/A 11/21/2015   Procedure: Left Heart Cath and Coronary Angiography;  Surgeon: [name], MD;   Location: [facility] INVASIVE CV LAB;  Service: Cardiovascular;  Laterality: N/A;   COLONOSCOPY  03/18/2024   per Dr. [name], adenomatous polyp, repeat in 5 yrs   CORONARY ARTERY BYPASS GRAFT N/A 11/22/2015   Procedure: CORONARY ARTERY BYPASS GRAFTING (CABG) times five using the left internal mammary, right greater saphenous vein EVH, and left thigh greater saphenous vein EVH;  Surgeon: [name], MD;  Location: [facility] OR;  Service: Open Heart Surgery;  Laterality: N/A;   CORONARY ARTERY BYPASS GRAFT  2017   CORONARY PRESSURE/FFR STUDY N/A 12/14/2018   Procedure: INTRAVASCULAR PRESSURE WIRE/FFR STUDY;  Surgeon: [name], MD;  Location: [facility] INVASIVE CV LAB;  Service: Cardiovascular;  Laterality: N/A;   ESOPHAGOGASTRODUODENOSCOPY  03/18/2024   per Dr. [name], normal   frozen shoulder release Right    GANGLION CYST EXCISION Right 1989   LEFT HEART CATH AND CORS/GRAFTS ANGIOGRAPHY N/A 10/27/2017   Procedure: LEFT HEART CATH AND CORS/GRAFTS ANGIOGRAPHY;  Surgeon: Sharps [name], MD;  Location: [name];  Service: Cardiovascular;  Laterality: N/A;   LEFT HEART CATH AND CORS/GRAFTS ANGIOGRAPHY N/A 12/14/2018   Procedure: LEFT HEART CATH AND CORS/GRAFTS ANGIOGRAPHY;  Surgeon: [name], MD;  Location: [facility] INVASIVE CV LAB;  Service: Cardiovascular;  Laterality: N/A;   TEE WITHOUT CARDIOVERSION N/A  11/22/2015   Procedure: TRANSESOPHAGEAL ECHOCARDIOGRAM (TEE);  Surgeon: [name], MD;  Location: [facility] OR;  Service: Open Heart Surgery;  Laterality: N/A;   WRIST GANGLION EXCISION Right    Patient Active Problem List   Diagnosis Date Noted   Fall 05/27/2024   Orthostatic hypotension 05/27/2024   Post-traumatic spasticity 05/27/2024   Coping style affecting medical condition 05/04/2024   Central cord syndrome (HCC) 04/21/2024   Acute incomplete quadriplegia (HCC) 04/21/2024   Shock (HCC) 04/15/2024   Varicose veins of both lower extremities 07/03/2023   Full  thickness rotator cuff tear 02/26/2023   Type 2 diabetes mellitus with diabetic polyneuropathy, without long-term current use of insulin  (HCC) 02/02/2023   Pancytopenia (HCC) 01/09/2023   OA (osteoarthritis) of knee 04/18/2022   Left shoulder pain 04/18/2022   GERD (gastroesophageal reflux disease)    Bacterial infection due to H. pylori 03/01/2020   Monoclonal gammopathy of unknown significance (MGUS) 03/01/2020   Iron  deficiency anemia 02/22/2020   Onychomycosis 02/08/2020   Rectal bleeding 01/24/2020   Indigestion 01/24/2020   Angina pectoris 12/12/2018   Erectile disorder due to medical condition in male 04/08/2018   Chronic combined systolic (congestive) and diastolic (congestive) heart failure (HCC) 07/29/2017   Pericardial effusion 12/24/2015   S/P CABG x 5 11/22/2015   Abnormal stress test 11/21/2015   Coronary artery disease of bypass graft of native heart with stable angina pectoris    Nonspecific abnormal electrocardiogram (ECG) (EKG) 11/07/2015   Essential hypertension 03/30/2011   Hypercholesterolemia 03/30/2011   Colon polyp 03/30/2011    ONSET DATE: 05/17/2024 (referral date)    REFERRING DIAG: G82.50 (ICD-10-CM) - Quadriplegia, unspecified S14.129A (ICD-10-CM) - Central cord syndrome, initial encounter (HCC)    THERAPY DIAG:  Other lack of coordination  Attention and concentration deficit  Quadriplegia, C1-C4 incomplete (HCC)  Muscle weakness (generalized)  Other symptoms and signs involving the musculoskeletal system  Rationale for Evaluation and Treatment: Rehabilitation  SUBJECTIVE:   SUBJECTIVE STATEMENT: We were just talking about getting the rocker knife earlier. Pt reports improved ease with donning shirt. Pt accompanied by: [name]   PERTINENT HISTORY: Presented 04/15/2024 after being on a roof cutting a tree branch when the tree fell on him pinning him against the roof. Pt underwent ACDF C6-7 on 04/19/24. No loss of consciousness. PMH:   significant for diabetes mellitus, CAD with CABG 2017, hypertension, hyperlipidemia  IMPRESSION from MRI on 04/16/24: 1. No acute fracture or ligamentous injury of the cervical spine. 2. Focal hyperintense T2-weighted signal within the spinal cord at the C3-4 levels and at C7, which may indicate myelomalacia or spinal cord edema. 3. Severe spinal canal stenosis and bilateral neural foraminal stenosis at C6-7. 4. Mild spinal canal stenosis and severe bilateral neural foraminal stenosis at C3-4. 5. Moderate left C2-3, left C4-5 and bilateral C5-6 neural foraminal stenosis.  PRECAUTIONS: Cervical, Fall, and Other: soft collar, orthostatic hypotension (wears abdominal binder and TED hoses), lifting restrictions   WEIGHT BEARING RESTRICTIONS: ? UE wt bearing  PAIN:  Are you having pain? Yes: NPRS scale: 3/10 at rest at R knee Pain location: R knee Pain description: stiffness  Aggravating factors: certain positions Relieving factors: changing positions, OTC meds and muscle relaxers  FALLS: Has patient fallen in last 6 months? Yes. 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  HAND DOMINANCE: Right  ADLs: Transfers/ambulation related to ADLs: rollator Eating: mod I using Rt hand mostly with adapted utensils, wife cuts food Grooming: mod I for brushing teeth/shaving using both hands, except wife brushes pt's hair UB Dressing: min assist - improving LB Dressing: max  assist for TED hose, fluctuating assist for pants/shoes Toileting: supervision and assist for wiping Bathing: assist w/ lower legs and back  Tub Shower transfers: min assist Equipment: Shower seat with back and Grab bars  IADLs:  Shopping: pt has gone with wife 2x since d/c from hospital Light housekeeping: dependent Meal Prep: dependent Community mobility: dependent Medication management: wife places in pillbox, wife assists Handwriting: 90% legible in print (pt reports signature was always bad)   MOBILITY STATUS: using rollator   UPPER EXTREMITY ROM:  RUE AROM at shoulder and elbow WFL's. Limited more distally with 75% wrist and 50% composite flexion  LUE shoulder movement limited in approx 45* scaption (lesser true flex), 75% ER, 90% IR, Elbows distally WFLs w/ difficulty w/ index PIP extension   UPPER EXTREMITY MMT:   not tested d/t precautions, current deficits and premorbid rotator cuff tears bilaterally    HAND FUNCTION: Grip strength: Right: 5.2 lbs; Left: 29.3 lbs  COORDINATION: 9 Hole Peg test: Right: placed 6 in 2 min;   Left: 117.83 sec Box and Blocks:  Right 31 blocks, Left 35 blocks  SENSATION: Light touch: WFL Pt reports some numbness in fingertips (premorbid but more pronounced since accident)   EDEMA: mild Rt hand, slightly worse in fingers Rt hand  MUSCLE TONE: pt has reported spasms and on muscle relaxer's (take at night)   COGNITION: Overall cognitive status: Within functional limits for tasks assessed  VISION: Subjective report: Pt reports no changes  Baseline vision: Bifocals Visual history: none    PERCEPTION: Not tested  PRAXIS: Not tested  OBSERVATIONS: bilateral hand deficits (Rt worse than Lt), bilateral shoulder deficits (Lt worse than Rt)  Premorbid rotator cuff tears but pt reports he had majority of ROM both shoulders prior to accident - now limited in ROM Lt shoulder                                                                                                                              TREATMENT DATE: 07/21/24 Obtained new measurements, see goals for updated measurements. Discussed results.  Allowed pt to trial rocker knife of different shapes in order to obtain best fit. Pt reported liking knife with horizontal build up handle more than vertical handle, assisted spouse in obtaining this via [employer].  Pt did not have cervical collar on this date, did not complete shoulder ROM  HEPs d/t safety concerns. Educated in additional FM coordination activity of tearing tissue into long strips and wadding into tiny balls, educated in modifying by completing on tabletop and wadding tissue from up top.      PATIENT EDUCATION: Education details: SEE ABOVE Person educated: Patient and Spouse Education method: Explanation, Verbal cues, and Handouts Education comprehension: verbalized understanding and returned demonstration  HOME EXERCISE PROGRAM: 07/04/24: yellow theraputty HEP (ACCESS CODE [id number]), coordination    GOALS: Goals reviewed with patient? Yes  SHORT TERM GOALS: Target date: 07/22/24  Pt independent with HEP for bilateral coordination and grip strength Baseline:  Goal status: IN PROGRESS  2.  Pt independent with modified shoulder HEP (premorbid rotator cuff tear)  Baseline:  Goal status: IN PROGRESS  3.  Pt to consistently be mod I for donning/doffing overhead shirts and UB bathing w/ AE prn Baseline:  07/21/24: Pt reports donning/doffing UB short sleeve shirts independently/Mod I, needs Min A for donning long sleeves (adjustments at sleeves only). Pt also educated in and looking to obtain long handled sponge. Goal status: IN PROGRESS  4.  Pt to perform LE dressing w/ min assist using AE prn (w/ exception to TED hoses)  Baseline: mod to max assist 07/21/24: occasional assistance for pants at this time Goal status: IN PROGRESS  5.  Grip strength to increase by 10 lbs or more on Rt side and 5 lbs or  more on Lt side Baseline: 5.2 lbs, 29.3 lbs 07/21/24: 13.0 lbs, 29.1 lbs Goal status: IN PROGRESS  6.  Improve BUE function as evidenced by increasing Box & Blocks by 5 or more blocks Baseline: Rt = 31, Lt = 35 07/21/24: R: 36, L: 39 Goal status: IN PROGRESS  LONG TERM GOALS: Target date: 08/22/24  Independent with updated HEP  Baseline:  Goal status: IN PROGRESS  2.  Pt to be mod I for all BADLS including brushing hair and LB dressing (exception TED hoses)  Baseline:  07/21/24: Pt reports improved grooming and is obtaining long handled comb, pt reports occasional assistance with pants especially when already wearing compression stockings Goal status: IN PROGRESS  3.  Improve Rt hand coordination as evidenced by reducing speed on 9 hole peg test in under 2 minutes Baseline:  07/21/24: 1 minutes, 24 seconds Goal status: GOAL MET  4.  Improve Lt hand coordination as evidenced by reducing speed on 9 hole peg test in under 2 minutes Baseline:  07/21/24: 1 minute, 29 seconds Goal status: GOAL MET  5.  Pt to perform light IADLS in standing with countertop support w/o LOB  Baseline:  Goal status: INITIAL  6.  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 Pt demonstrated good understanding of HEP's for coordination and shoulders. Pt did also require min cues for proper form. Pt would benefit from continued skilled services to continue to improve ability to complete ADLs and improve UE strength for functional transfers and to reduce caregiver burden. PERFORMANCE DEFICITS: in functional skills including ADLs, IADLs, coordination, dexterity, sensation, edema, ROM,  strength, pain, flexibility, Fine motor control, Gross motor control, mobility, body mechanics, cardiopulmonary status limiting function, decreased knowledge of precautions, decreased knowledge of use of DME, and UE functional use.  IMPAIRMENTS: are limiting patient from ADLs, IADLs, rest and sleep, work, leisure, and social participation.   CO-MORBIDITIES: has co-morbidities such as orthostatic hypotension, bilateral rotator cuff tears that affects occupational performance. Patient will benefit from skilled OT to address above impairments and improve overall function.  MODIFICATION OR ASSISTANCE TO COMPLETE EVALUATION: Min-Moderate modification of tasks or assist with assess necessary to complete an evaluation.  OT OCCUPATIONAL PROFILE AND HISTORY: Detailed assessment: Review of records and additional review of physical, cognitive, psychosocial history related to current functional performance.  CLINICAL DECISION MAKING: Moderate - several treatment options, min-mod task modification necessary  REHAB POTENTIAL: Good  EVALUATION COMPLEXITY: Moderate    PLAN:  OT FREQUENCY: 2x/week  OT DURATION: 8 weeks  PLANNED INTERVENTIONS: 97535 self care/ADL training, 02889 therapeutic exercise, 97530 therapeutic activity, 97112 neuromuscular re-education, 97140 manual therapy, 97113 aquatic therapy, 97018 paraffin, 02960 fluidotherapy, 97010 moist heat, 97760 Orthotic Initial, 97763 Orthotic/Prosthetic subsequent, passive range of motion, functional mobility training, patient/family education, and DME and/or AE instructions  RECOMMENDED OTHER SERVICES: none at this time  CONSULTED AND AGREED WITH PLAN OF CARE: Patient and family member/caregiver  PLAN FOR NEXT SESSION:  F/u on AE obtaining Add onto shoulder HEP  Add onto coordination HEP as needed Coordination and grip strength activities Monitor gross grasp and need for Rt resting hand splint     Rocky Dutch, OT 07/21/2024, 4:35  PM
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
## 2024-07-22 ENCOUNTER — Encounter: Payer: Self-pay | Admitting: Internal Medicine

## 2024-07-22 ENCOUNTER — Ambulatory Visit: Admitting: Internal Medicine

## 2024-07-22 VITALS — BP 120/80 | HR 74 | Ht 70.0 in | Wt 178.0 lb

## 2024-07-22 DIAGNOSIS — E1142 Type 2 diabetes mellitus with diabetic polyneuropathy: Secondary | ICD-10-CM | POA: Diagnosis not present

## 2024-07-22 DIAGNOSIS — E1165 Type 2 diabetes mellitus with hyperglycemia: Secondary | ICD-10-CM

## 2024-07-22 DIAGNOSIS — Z794 Long term (current) use of insulin: Secondary | ICD-10-CM | POA: Diagnosis not present

## 2024-07-22 DIAGNOSIS — E118 Type 2 diabetes mellitus with unspecified complications: Secondary | ICD-10-CM

## 2024-07-22 DIAGNOSIS — E1159 Type 2 diabetes mellitus with other circulatory complications: Secondary | ICD-10-CM

## 2024-07-22 LAB — POCT GLYCOSYLATED HEMOGLOBIN (HGB A1C): Hemoglobin A1C: 7.7 % — AB (ref 4.0–5.6)

## 2024-07-22 MED ORDER — SEMAGLUTIDE (2 MG/DOSE) 8 MG/3ML ~~LOC~~ SOPN: 2.0000 mg | PEN_INJECTOR | SUBCUTANEOUS | 3 refills | Status: AC

## 2024-07-22 MED ORDER — METFORMIN HCL ER 500 MG PO TB24: 1000.0000 mg | ORAL_TABLET | Freq: Two times a day (BID) | ORAL | 3 refills | Status: DC

## 2024-07-22 NOTE — Progress Notes (Signed)
 Name: Jerome Vaughn  Age/ Sex: 73 y.o., male   MRN/ DOB: [id number], Apr 20, 1951     PCP: [name] LABOR, MD   Reason for Endocrinology Evaluation: Type 2 Diabetes Mellitus  Initial Endocrine Consultative Visit: 10/04/2013    PATIENT IDENTIFIER: [name] is a 73 y.o. male with a past medical history of DM, HTN, CAD, dyslipidemia the patient has followed with Endocrinology clinic since 10/04/2013 for consultative assistance with management of his diabetes.  DIABETIC HISTORY:  Jerome Vaughn was diagnosed with DM 2002.  Pioglitazone -edema. His hemoglobin A1c has ranged from 6.5% in 2021, peaking at 9.2% in 2023.   He was followed by Dr. Kassie from 2015 until 12/2021  Patient self discontinued bromocriptine  06/2022 Started Ozempic  01/2023  He stopped Jardiance  due to cost issues    Stopped repaglinide  in June, 2025 with an A1c of 6.3%  SUBJECTIVE:   During the last visit (03/18/2024): A1c 6.3%    Today (07/22/2024): Jerome Vaughn is here for follow-up on diabetes management.  He checks his blood sugars 3-4 times daily. The patient has not  had hypoglycemic episodes since the last clinic visit.   Since his last visit here, the patient sustained a fall from the roof in July, 2025 resulting in traumatic spinal cord injury.  He did require neck surgery  The patient followed with physical therapy and rehab for central cord syndrome  Patient follows with cardiology for coronary artery disease  He received right knee intra-articular injection 2 days ago  No nausea or vomiting  No constipation or diarrhea    HOME DIABETES REGIMEN:  Metformin  500 mg XR, 2 tabs BID  Ozempic  1 mg weekly      Statin: Yes ACE-I/ARB: Yes   METER DOWNLOAD SUMMARY:  92-326  mg/dL     DIABETIC COMPLICATIONS: Microvascular complications:  Peripheral neuropathy Denies:  Last Eye Exam: Completed 05/2023  Macrovascular complications:  CAD Denies:  CVA, PVD   HISTORY:  Past Medical History:  Past  Medical History:  Diagnosis Date   Carpal tunnel syndrome    both hands   Colon polyps    Coronary artery disease    sees Dr. [name]   Diabetes mellitus    sees Dr. [name]   Diabetes mellitus [facility]) 2009   Ganglion cyst of wrist, right 1989   GERD (gastroesophageal reflux disease)    History of echocardiogram    a. Echo 4/17: Moderate LVH, EF 50-55%, mild LAE, no pericardial effusion   Hyperlipidemia    Hypertension    MGUS (monoclonal gammopathy of unknown significance)    sees Dr. [name]   Neuropathy    gets foot exams with Dr. [name] (podiatry)   Pneumonia    Rotator cuff injury    Past Surgical History:  Past Surgical History:  Procedure Laterality Date   ANTERIOR CERVICAL DECOMP/DISCECTOMY FUSION N/A 04/19/2024   Procedure: ANTERIOR CERVICAL DECOMPRESSION/DISCECTOMY FUSION CERVICAL SIX-SEVEN;  Surgeon: [name] MATSU, MD;  Location: [facility] OR;  Service: Neurosurgery;  Laterality: N/A;  ACDF C67   CARDIAC CATHETERIZATION N/A 11/21/2015   Procedure: Left Heart Cath and Coronary Angiography;  Surgeon: [name], MD;  Location: [facility] INVASIVE CV LAB;  Service: Cardiovascular;  Laterality: N/A;   COLONOSCOPY  03/18/2024   per Dr. [name], adenomatous polyp, repeat in 5 yrs   CORONARY ARTERY BYPASS GRAFT N/A 11/22/2015   Procedure: CORONARY ARTERY BYPASS GRAFTING (CABG) times five using the left internal mammary, right greater saphenous vein EVH,  and left thigh greater saphenous vein EVH;  Surgeon: [name], MD;  Location: [facility] OR;  Service: Open Heart Surgery;  Laterality: N/A;   CORONARY ARTERY BYPASS GRAFT  2017   CORONARY PRESSURE/FFR STUDY N/A 12/14/2018   Procedure: INTRAVASCULAR PRESSURE WIRE/FFR STUDY;  Surgeon: [name], MD;  Location: [facility] INVASIVE CV LAB;  Service: Cardiovascular;  Laterality: N/A;   ESOPHAGOGASTRODUODENOSCOPY  03/18/2024   per Dr. [name], normal   frozen shoulder release Right    GANGLION CYST EXCISION Right  1989   LEFT HEART CATH AND CORS/GRAFTS ANGIOGRAPHY N/A 10/27/2017   Procedure: LEFT HEART CATH AND CORS/GRAFTS ANGIOGRAPHY;  Surgeon: [name] Victory ORN, MD;  Location: [name];  Service: Cardiovascular;  Laterality: N/A;   LEFT HEART CATH AND CORS/GRAFTS ANGIOGRAPHY N/A 12/14/2018   Procedure: LEFT HEART CATH AND CORS/GRAFTS ANGIOGRAPHY;  Surgeon: [name], MD;  Location: [facility] INVASIVE CV LAB;  Service: Cardiovascular;  Laterality: N/A;   TEE WITHOUT CARDIOVERSION N/A 11/22/2015   Procedure: TRANSESOPHAGEAL ECHOCARDIOGRAM (TEE);  Surgeon: [name], MD;  Location: [facility] OR;  Service: Open Heart Surgery;  Laterality: N/A;   WRIST GANGLION EXCISION Right    Social History:  reports that he has never smoked. He has never used smokeless tobacco. He reports current alcohol use of about 3.0 - 4.0 standard drinks of alcohol per week. He reports that he does not use drugs. Family History:  Family History  Problem Relation Age of Onset   Heart failure Father    Hyperlipidemia Sister    Diabetes Sister    Hypertension Sister    Kidney failure Sister    Hyperlipidemia Sister    Diabetes Sister    Colon polyps Sister    Kidney disease Sister    Anuerysm Brother        AAA   Other Brother        COVID 19   Diabetes Maternal Grandmother    Diabetes Son    Heart disease Son    Heart attack Neg Hx    Colon cancer Neg Hx    Stomach cancer Neg Hx    Esophageal cancer Neg Hx    Pancreatic cancer Neg Hx      HOME MEDICATIONS: Allergies as of 07/22/2024       Reactions   Oxycodone  Other (See Comments)   Pt gets confused/loopy on Oxycodone - he wants to avoid        Medication List        Accurate as of July 22, 2024  2:14 PM. If you have any questions, ask your nurse or doctor.          Accu-Chek Guide Test test strip Generic drug: glucose blood TEST BLOOD SUGAR IN THE MORNING, AT NOON AND AT BEDTIME   Accu-Chek Softclix Lancets lancets USE IN THE  MORNING, AT NOON, AND AT BEDTIME.   acetaminophen  500 MG tablet Commonly known as: TYLENOL  Take 500 mg by mouth as needed.   apixaban  2.5 MG Tabs tablet Commonly known as: ELIQUIS  Take 1 tablet (2.5 mg total) by mouth 2 (two) times daily.   aspirin  EC 81 MG tablet Take 1 tablet (81 mg total) by mouth daily.   atomoxetine  25 MG capsule Commonly known as: STRATTERA  Take 2 capsules (50 mg total) by mouth daily.   Baclofen  5 MG Tabs Take 1-2 tablets (5-10 mg total) by mouth 3 (three) times daily. Since increasing the dose, increasing the amount- for spasticity- with robaxin  prn  bisacodyl  10 MG suppository Commonly known as: DULCOLAX Place 1 suppository (10 mg total) rectally daily as needed for moderate constipation.   Blood Glucose Monitoring Suppl Devi 1 each by Does not apply route in the morning, at noon, and at bedtime. May substitute to any manufacturer covered by patient's insurance. ACCU CHECK GUIDE   diclofenac  Sodium 1 % Gel Commonly known as: VOLTAREN  Apply 2 g topically 2 (two) times daily as needed (Shoulder pain).   docusate sodium  100 MG capsule Commonly known as: COLACE Take 1 capsule (100 mg total) by mouth 2 (two) times daily.   ezetimibe  10 MG tablet Commonly known as: ZETIA  Take 1 tablet (10 mg total) by mouth daily.   FeroSul 325 (65 Fe) MG tablet Generic drug: ferrous sulfate  Take 1 tablet (325 mg total) by mouth daily with breakfast.   FISH OIL PO Take 1 capsule by mouth daily.   fludrocortisone  0.1 MG tablet Commonly known as: FLORINEF  Take 1 tablet (0.1 mg total) by mouth daily.   gabapentin  300 MG capsule Commonly known as: NEURONTIN  Take 1 capsule (300 mg total) by mouth 3 (three) times daily as needed (severe nerve pain- could cause loopiness).   lidocaine  5 % Commonly known as: LIDODERM  Place 2 patches onto the skin daily. Remove & Discard patch within 12 hours or as directed by MD   metFORMIN  500 MG 24 hr tablet Commonly known as:  GLUCOPHAGE -XR Take 2 tablets (1,000 mg total) by mouth 2 (two) times daily.   methocarbamol  500 MG tablet Commonly known as: ROBAXIN  Take 2 tablets (1,000 mg total) by mouth every 8 (eight) hours as needed for muscle spasms (can take with baclofen - for different reasons- spasms vs spasticity). With baclofen    midodrine  10 MG tablet Commonly known as: PROAMATINE  Take 1 tablet (10 mg total) by mouth 3 (three) times daily with meals.   multivitamin tablet Take 1 tablet by mouth daily.   nitroGLYCERIN  0.4 MG SL tablet Commonly known as: NITROSTAT  Place 1 tablet (0.4 mg total) under the tongue every 5 (five) minutes as needed for chest pain.   Ozempic  (1 MG/DOSE) 4 MG/3ML Sopn Generic drug: Semaglutide  (1 MG/DOSE) Inject 0.5 mg into the skin once a week.   pantoprazole  40 MG tablet Commonly known as: PROTONIX  Take 1 tablet (40 mg total) by mouth 2 (two) times daily. Please schedule an office visit for further refills. Thank you   polyethylene glycol 17 g packet Commonly known as: MIRALAX  / GLYCOLAX  Take 17 g by mouth daily.   ranolazine  500 MG 12 hr tablet Commonly known as: RANEXA  Take 1 tablet (500 mg total) by mouth 2 (two) times daily.   repaglinide  0.5 MG tablet Commonly known as: PRANDIN  Take 1 tablet (0.5 mg total) by mouth 2 (two) times daily before a meal.   rosuvastatin  40 MG tablet Commonly known as: CRESTOR  Take 1 tablet (40 mg total) by mouth daily.   tamsulosin  0.4 MG Caps capsule Commonly known as: FLOMAX  Take 1 capsule (0.4 mg total) by mouth daily after supper.   traMADol  50 MG tablet Commonly known as: ULTRAM  Take 1 tablet (50 mg total) by mouth every 6 (six) hours as needed for severe pain (pain score 7-10).   VITAMIN B-12 PO Take 1 tablet by mouth daily.   VITAMIN C PO Take 1 tablet by mouth daily.   VITAMIN E PO Take 1 capsule by mouth daily.         OBJECTIVE:   Vital Signs: BP 120/80 (BP  Location: Left Arm, Patient Position: Sitting,  Cuff Size: Normal)   Pulse 74   Ht 5' 10 (1.778 m)   Wt 178 lb (80.7 kg)   SpO2 99%   BMI 25.54 kg/m   Wt Readings from Last 3 Encounters:  07/22/24 178 lb (80.7 kg)  06/14/24 173 lb 9.6 oz (78.7 kg)  06/01/24 179 lb (81.2 kg)     Exam: General: Pt appears well and is in NAD  Lungs: Clear with good BS bilat   Heart: RRR   Abdomen:  soft, nontender  Extremities: No pretibial edema.   Neuro: MS is good with appropriate affect, pt is alert and Ox3    DM foot exam: Per podiatry 06/23/2024    DATA REVIEWED:  Lab Results  Component Value Date   HGBA1C 6.4 (H) 04/17/2024   HGBA1C 6.3 (A) 03/18/2024   HGBA1C 6.4 (A) 08/07/2023    Latest Reference Range & Units 04/10/23 09:52  Sodium 135 - 145 mmol/L 135  Potassium 3.5 - 5.1 mmol/L 4.5  Chloride 98 - 111 mmol/L 106  CO2 22 - 32 mmol/L 21 (L)  Glucose 70 - 99 mg/dL 692 (H)  BUN 8 - 23 mg/dL 21  Creatinine 9.38 - 8.75 mg/dL 8.86  Calcium  8.9 - 10.3 mg/dL 9.1  Anion gap 5 - 15  8  Alkaline Phosphatase 38 - 126 U/L 23 (L)  Albumin  3.5 - 5.0 g/dL 4.2  AST 15 - 41 U/L 20  ALT 0 - 44 U/L 23  Total Protein 6.5 - 8.1 g/dL 7.7  Total Bilirubin 0.3 - 1.2 mg/dL 0.4  GFR, Est Non African American >60 mL/min >60   Old records , labs and images have been reviewed.   ASSESSMENT / PLAN / RECOMMENDATIONS:   1) Type 2 Diabetes Mellitus, Sub Optimally controlled, With neuropathic and macrovascular complications - Most recent A1c of 7.7 %. Goal A1c < 7.0 %.     -A1c has trended up -He was on Jardiance  through cardiology, but once he started Ozempic  he could not afford Jardiance , we had discussed the cardiovascular and renal benefit of SGLT2 inhibitors, he was provided with patient assistance program for Jardiance  at the time - He has recently noted with hyperglycemia, he attributes this to receiving intra-articular glucocorticoid injection, I had discontinued repaglinide  in the past, he was not sure if he did discontinue or not, I did  advise him to take repaglinide  1 tablet with breakfast for now until his hypoglycemia has improved with BG readings of < 150 mg/dL  MEDICATIONS: Increase Ozempic  2 mg weekly Continue metformin  500 mg XR, 2 tabs twice daily   EDUCATION / INSTRUCTIONS: BG monitoring instructions: Patient is instructed to check his blood sugars 1 times daily. Call [place] [facility] if: BG persistently < 70  I reviewed the Rule of 15 for the treatment of hypoglycemia in detail with the patient. Literature supplied.    2) Diabetic complications:  Eye: Does not have known diabetic retinopathy.  Neuro/ Feet: Does  have known diabetic peripheral neuropathy .  Renal: Patient does not have known baseline CKD. He   is on an ACEI/ARB at present.     F/U in 6 months   I spent 25 minutes preparing to see the patient by review of recent labs, imaging and procedures, obtaining and reviewing separately obtained history, communicating with the patient/family or caregiver, ordering medications, tests or procedures, and documenting clinical information in the EHR including the differential Dx, treatment, and any further evaluation  and other management   Signed electronically by: Stefano Redgie Butts, MD  [facility] Endocrinology  [facility] [address], [address] [place], [place] [address] Phone: [phone] FAX: [phone]   CC: [name] LABOR, MD [address] [place] [address] Phone: [phone]  Fax: [phone]  Return to Endocrinology clinic as below: Future Appointments  Date Time Provider Department Center  07/26/2024  2:00 PM Ulyses Daved NOVAK, [place] [facility]  07/26/2024  2:45 PM Abelardo Burnard PARAS, OT [facility]  07/28/2024  2:45 PM Althia Longs, OT [facility]  07/29/2024  2:00 PM Harry Birmingham, PT [facility]  08/02/2024  2:00 PM Abelardo Burnard PARAS, OT [facility]  08/02/2024  2:45 PM Harry Birmingham, PT [facility]  08/04/2024   2:45 PM Harry Birmingham, PT [facility]  08/04/2024  3:30 PM Althia Longs, OT [facility]  08/09/2024  2:00 PM Abelardo Burnard PARAS, OT [facility]  08/09/2024  2:45 PM Ulyses Daved NOVAK, PT [facility]  08/11/2024  2:00 PM Althia Longs, OT [facility]  08/11/2024  2:45 PM Ulyses Daved NOVAK, PT [facility]  08/16/2024  2:00 PM Abelardo Burnard PARAS, OT [facility]  08/16/2024  2:45 PM Ulyses Daved NOVAK, PT [facility]  08/18/2024  2:00 PM Ulyses Daved NOVAK, PT [facility]  08/18/2024  2:45 PM Althia Longs, OT [facility]  08/22/2024  2:00 PM Althia Longs, OT [facility]  08/22/2024  2:45 PM Ulyses Daved NOVAK, PT [facility]  08/23/2024  3:00 PM Croitoru, Jerel, MD [facility]  08/30/2024  2:00 PM Abelardo Burnard PARAS, OT [facility]  08/30/2024  2:45 PM Harry Birmingham, PT [facility]  09/01/2024  2:00 PM Althia Longs, OT [facility]  09/01/2024  2:45 PM Harry Birmingham, PT [facility]  09/09/2024  1:00 PM Cornelio Bouchard, MD [facility]  09/26/2024  3:30 PM Loreda Hacker, DPM [facility]

## 2024-07-22 NOTE — Patient Instructions (Addendum)
 Continue Metformin  500 mg XR, 2 tablets twice daily  Increase  Ozempic  2 mg once weekly   Take 0.5 mg, 1 tablet before the first meal of the [place] for the next week or until the sugar starts to come down below 150.   HOW TO TREAT LOW BLOOD SUGARS (Blood sugar LESS THAN 70 MG/DL) Please follow the RULE OF 15 for the treatment of hypoglycemia treatment (when your (blood sugars are less than 70 mg/dL)   STEP 1: Take 15 grams of carbohydrates when your blood sugar is low, which includes:  3-4 GLUCOSE TABS  OR 3-4 OZ OF JUICE OR REGULAR SODA OR ONE TUBE OF GLUCOSE GEL    STEP 2: RECHECK blood sugar in 15 MINUTES STEP 3: If your blood sugar is still low at the 15 minute recheck --> then, go back to STEP 1 and treat AGAIN with another 15 grams of carbohydrates.

## 2024-07-23 ENCOUNTER — Other Ambulatory Visit (HOSPITAL_COMMUNITY): Payer: Self-pay

## 2024-07-24 ENCOUNTER — Other Ambulatory Visit: Payer: Self-pay | Admitting: Internal Medicine

## 2024-07-24 DIAGNOSIS — E118 Type 2 diabetes mellitus with unspecified complications: Secondary | ICD-10-CM

## 2024-07-26 ENCOUNTER — Encounter: Payer: Self-pay | Admitting: Physical Therapy

## 2024-07-26 ENCOUNTER — Ambulatory Visit: Admitting: Occupational Therapy

## 2024-07-26 ENCOUNTER — Encounter: Payer: Self-pay | Admitting: Occupational Therapy

## 2024-07-26 ENCOUNTER — Ambulatory Visit: Admitting: Physical Therapy

## 2024-07-26 VITALS — BP 130/74 | HR 85

## 2024-07-26 DIAGNOSIS — M62838 Other muscle spasm: Secondary | ICD-10-CM | POA: Diagnosis not present

## 2024-07-26 DIAGNOSIS — R2689 Other abnormalities of gait and mobility: Secondary | ICD-10-CM

## 2024-07-26 DIAGNOSIS — R278 Other lack of coordination: Secondary | ICD-10-CM | POA: Diagnosis not present

## 2024-07-26 DIAGNOSIS — G8252 Quadriplegia, C1-C4 incomplete: Secondary | ICD-10-CM | POA: Diagnosis not present

## 2024-07-26 DIAGNOSIS — R293 Abnormal posture: Secondary | ICD-10-CM | POA: Diagnosis not present

## 2024-07-26 DIAGNOSIS — R29818 Other symptoms and signs involving the nervous system: Secondary | ICD-10-CM | POA: Diagnosis not present

## 2024-07-26 DIAGNOSIS — R29898 Other symptoms and signs involving the musculoskeletal system: Secondary | ICD-10-CM

## 2024-07-26 DIAGNOSIS — M6281 Muscle weakness (generalized): Secondary | ICD-10-CM | POA: Diagnosis not present

## 2024-07-26 DIAGNOSIS — R208 Other disturbances of skin sensation: Secondary | ICD-10-CM | POA: Diagnosis not present

## 2024-07-26 NOTE — Therapy (Signed)
 OUTPATIENT PHYSICAL THERAPY NEURO TREATMENT   Patient Name: Jerome Vaughn MRN: [id number] DOB:[birth year], 73 y.o., male Today's Date: 07/26/2024   PCP: [name] LABOR, MD REFERRING PROVIDER: [name] PARAS, PA-C  END OF SESSION:  PT End of Session - 07/26/24 1408     Visit Number 6    Number of Visits 17   16+eval (1-2x/wk freq)   Date for Recertification  09/02/24    Authorization Type [employer]    Progress Note Due on Visit 10    PT Start Time 1405    PT Stop Time 1446    PT Time Calculation (min) 41 min    Equipment Utilized During Treatment Gait belt    Activity Tolerance Patient tolerated treatment well    Behavior During Therapy WFL for tasks assessed/performed             Past Medical History:  Diagnosis Date   Carpal tunnel syndrome    both hands   Colon polyps    Coronary artery disease    sees Dr. [name]   Diabetes mellitus    sees Dr. [name]   Diabetes mellitus [facility]) 2009   Ganglion cyst of wrist, right 1989   GERD (gastroesophageal reflux disease)    History of echocardiogram    a. Echo 4/17: Moderate LVH, EF 50-55%, mild LAE, no pericardial effusion   Hyperlipidemia    Hypertension    MGUS (monoclonal gammopathy of unknown significance)    sees Dr. [name]   Neuropathy    gets foot exams with Dr. [name] (podiatry)   Pneumonia    Rotator cuff injury    Past Surgical History:  Procedure Laterality Date   ANTERIOR CERVICAL DECOMP/DISCECTOMY FUSION N/A 04/19/2024   Procedure: ANTERIOR CERVICAL DECOMPRESSION/DISCECTOMY FUSION CERVICAL SIX-SEVEN;  Surgeon: [name] MATSU, MD;  Location: [facility] OR;  Service: Neurosurgery;  Laterality: N/A;  ACDF C67   CARDIAC CATHETERIZATION N/A 11/21/2015   Procedure: Left Heart Cath and Coronary Angiography;  Surgeon: [name], MD;  Location: [facility] INVASIVE CV LAB;  Service: Cardiovascular;  Laterality: N/A;   COLONOSCOPY  03/18/2024   per Dr. [name], adenomatous polyp, repeat in  5 yrs   CORONARY ARTERY BYPASS GRAFT N/A 11/22/2015   Procedure: CORONARY ARTERY BYPASS GRAFTING (CABG) times five using the left internal mammary, right greater saphenous vein EVH, and left thigh greater saphenous vein EVH;  Surgeon: [name], MD;  Location: [facility] OR;  Service: Open Heart Surgery;  Laterality: N/A;   CORONARY ARTERY BYPASS GRAFT  2017   CORONARY PRESSURE/FFR STUDY N/A 12/14/2018   Procedure: INTRAVASCULAR PRESSURE WIRE/FFR STUDY;  Surgeon: [name], MD;  Location: [facility] INVASIVE CV LAB;  Service: Cardiovascular;  Laterality: N/A;   ESOPHAGOGASTRODUODENOSCOPY  03/18/2024   per Dr. [name], normal   frozen shoulder release Right    GANGLION CYST EXCISION Right 1989   LEFT HEART CATH AND CORS/GRAFTS ANGIOGRAPHY N/A 10/27/2017   Procedure: LEFT HEART CATH AND CORS/GRAFTS ANGIOGRAPHY;  Surgeon: Sharps [name], MD;  Location: [name];  Service: Cardiovascular;  Laterality: N/A;   LEFT HEART CATH AND CORS/GRAFTS ANGIOGRAPHY N/A 12/14/2018   Procedure: LEFT HEART CATH AND CORS/GRAFTS ANGIOGRAPHY;  Surgeon: [name], MD;  Location: [facility] INVASIVE CV LAB;  Service: Cardiovascular;  Laterality: N/A;   TEE WITHOUT CARDIOVERSION N/A 11/22/2015   Procedure: TRANSESOPHAGEAL ECHOCARDIOGRAM (TEE);  Surgeon: [name], MD;  Location: [facility] OR;  Service: Open Heart Surgery;  Laterality: N/A;  WRIST GANGLION EXCISION Right    Patient Active Problem List   Diagnosis Date Noted   Fall 05/27/2024   Orthostatic hypotension 05/27/2024   Post-traumatic spasticity 05/27/2024   Coping style affecting medical condition 05/04/2024   Central cord syndrome (HCC) 04/21/2024   Acute incomplete quadriplegia (HCC) 04/21/2024   Shock (HCC) 04/15/2024   Varicose veins of both lower extremities 07/03/2023   Full thickness rotator cuff tear 02/26/2023   Type 2 diabetes mellitus with diabetic polyneuropathy, without long-term current use of insulin  (HCC) 02/02/2023    Pancytopenia (HCC) 01/09/2023   OA (osteoarthritis) of knee 04/18/2022   Left shoulder pain 04/18/2022   GERD (gastroesophageal reflux disease)    Bacterial infection due to H. pylori 03/01/2020   Monoclonal gammopathy of unknown significance (MGUS) 03/01/2020   Iron  deficiency anemia 02/22/2020   Onychomycosis 02/08/2020   Rectal bleeding 01/24/2020   Indigestion 01/24/2020   Angina pectoris 12/12/2018   Erectile disorder due to medical condition in male 04/08/2018   Chronic combined systolic (congestive) and diastolic (congestive) heart failure (HCC) 07/29/2017   Pericardial effusion 12/24/2015   S/P CABG x 5 11/22/2015   Abnormal stress test 11/21/2015   Coronary artery disease of bypass graft of native heart with stable angina pectoris    Nonspecific abnormal electrocardiogram (ECG) (EKG) 11/07/2015   Essential hypertension 03/30/2011   Hypercholesterolemia 03/30/2011   Colon polyp 03/30/2011    ONSET DATE: 04/15/2024 (date of injury and hospital admission)  REFERRING DIAG: G82.50 (ICD-10-CM) - Quadriplegia, unspecified  THERAPY DIAG:  Other lack of coordination  Muscle weakness (generalized)  Other symptoms and signs involving the musculoskeletal system  Other symptoms and signs involving the nervous system  Abnormal posture  Other abnormalities of gait and mobility  Other muscle spasm  Other disturbances of skin sensation  Rationale for Evaluation and Treatment: Rehabilitation  SUBJECTIVE:                                                                                                                                                                                             SUBJECTIVE STATEMENT:  R knee pain is better today, only neck feeling stiff.  He continues to wear compression stockings, BLE ACE wrap,and his abodminal binder.  Pt accompanied by: significant other - Wife Jerome Vaughn  PERTINENT HISTORY: DM, mild neurogenic B/B, CAD/CABG 2017, severe  orthostatic hypotension, combined CHF, bilateral torn RTC, s/p C6-7 ACDF including discectomy for decompression of spinal cord and exiting nerve roots with foraminotomies 04/19/2024 per Dr. 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 Yes: NPRS scale: 0/10 Pain location: R knee, headache Pain description: ache and intermittent throbbing (knee)  Aggravating factors: weight bearing (knee); intensified pain in the knee (for headache) Relieving factors: have tried heat w/ mild + response as well as advil topical - uses on shoulders as well  PRECAUTIONS: Cervical, Fall, and Other: pt reports he is to wear soft cervical collar as needed - he prefers it for comfort currently; last available order states OOB AAT - will clarify w/ Dr. Cornelio  RED FLAGS: Bowel or bladder incontinence: Yes: neurogenic B/B   WEIGHT BEARING RESTRICTIONS: No  FALLS: Has patient fallen in last 6 months? Yes. Number of falls 2 - see eval subjective  LIVING ENVIRONMENT: Lives with: lives with their spouse and adult grandchild Lives in: House/apartment Stairs: Yes: External: 3 steps; can reach both Has following equipment at home: Walker - 4 wheeled, shower chair, bed side commode, Grab bars, and transport  PLOF: Needs assistance with ADLs, Needs assistance with homemaking, Needs assistance with gait, and Needs assistance with transfers  PATIENT GOALS: to work on pain and walking  OBJECTIVE:  Note: Objective measures were completed at Evaluation unless otherwise noted.  DIAGNOSTIC FINDINGS:  Cervical MRI 04/16/2024: IMPRESSION: 1. No acute fracture or ligamentous injury of the cervical spine. 2. Focal hyperintense T2-weighted signal within the spinal cord at the C3-4 levels and at C7, which may indicate myelomalacia or spinal cord edema. 3. Severe spinal canal stenosis and bilateral neural  foraminal stenosis at C6-7. 4. Mild spinal canal stenosis and severe bilateral neural foraminal stenosis at C3-4. 5. Moderate left C2-3, left C4-5 and bilateral C5-6 neural foraminal stenosis.  COGNITION: Overall cognitive status: Within functional limits for tasks assessed   SENSATION: Light touch: impaired knee to ankle bilaterally  COORDINATION: BLE RAMS:  WNL Bilateral Heel-to-shin:  limited by functional weakness of hips  EDEMA:  Bilateral LE mild non-pitting edema at baseline prior to incident - wears TED hose for orthostatic management.  MUSCLE TONE: LLE: Mild, Hypertonic, and Modifed Ashworth Scale 1+ = Slight increase in muscle tone, manifested by a catch, followed by minimal resistance throughout the remainder (less than half) of the ROM  POSTURE: posterior pelvic tilt and upper trunk rigidity - limited assessment by abdominal binder and soft cervical collar  LOWER EXTREMITY ROM:     Active  Right Eval Left Eval  Hip flexion Grossly WNL  Hip extension   Hip abduction   Hip adduction   Hip internal rotation   Hip external rotation   Knee flexion   Knee extension   Ankle dorsiflexion   Ankle plantarflexion    Ankle inversion    Ankle eversion     (Blank rows = not tested)  LOWER EXTREMITY MMT:    MMT Right Eval Left Eval  Hip flexion 4 4  Hip extension    Hip abduction    Hip adduction    Hip internal rotation    Hip external rotation    Knee flexion    Knee extension 4+ 4+  Ankle dorsiflexion 4- 4-  Ankle plantarflexion    Ankle inversion    Ankle eversion    (Blank rows = not tested)  BED MOBILITY:  Findings: Sit to supine Modified independence Supine to sit Modified independence Rolling to Right Modified independence Rolling to Left Modified independence Pt reports using insertable bed rails for independence.  TRANSFERS: Sit to stand: SBA  Assistive device utilized: Environmental Consultant - 4  wheeled     Stand to sit: SBA  Assistive device utilized:  Environmental Consultant - 4 wheeled     Chair to chair: SBA  Assistive device utilized: Environmental Consultant - 4 wheeled       RAMP:  Not tested  CURB:  Not tested  STAIRS: Not tested GAIT: Findings: Gait Characteristics: step through pattern, decreased stride length, decreased hip/knee flexion- Right, decreased hip/knee flexion- Left, decreased trunk rotation, trunk flexed, and narrow BOS, Distance walked: various clinic distances, Assistive device utilized:Walker - 4 wheeled, Level of assistance: SBA, and Comments: No overt LOB, crossover stepping, ataxia or marked pathway deviation.  Slow pace.  FUNCTIONAL TESTS:  5 times sit to stand: 23.03 sec intermittent hand support on rollator, pt denies dizziness or lightheadedness Timed up and go (TUG): TBA 2 minute walk test: TBA Berg Balance Scale:  TBA    PATIENT SURVEYS:  None completed due to time.                                                                                                                              TREATMENT DATE: 07/26/2024  Self-Care Vitals:   07/26/24 1411 07/26/24 1425  BP: (!) 155/94 130/74  Pulse: 78 85    Assessed in LUE at rest, vitals WNL Second reading obtained following SciFit to assess for BP stability w/ activity.  TherAct SciFit x8 minutes in hill mode up to level 4.0 using BUE/BLE for large amplitude reciprocal mobility, LE strength, and global endurance.  NMR: Obstacle course:  blue mat 4 hurdles > 8 LLE cone taps w/ SPC CGA 2x15 ft Advance retreat 4 hurdle on blue mat surface x20 alternating LE > Lateral LLE advance retreat 4 hurdle x20 CGA Alternating LE 8 cone tap x20 broken into reps to fatigue (noted by heavy taps or flipping cone over) CGA Wide stance volleyball bounce x10 > wide semi-tandem R rear x10 > wide semi-tandem L rear x10 SBA no LOB, mild R knee valgus  PATIENT EDUCATION: Education details:  Continue HEP, monitor BP at home.  Continue heat/ice/topicals for pain management.  Work on  standing/walking w/ cane as tolerated at home. Person educated: Patient and Spouse Education method: Medical Illustrator Education comprehension: verbalized understanding, returned demonstration, and needs further education  HOME EXERCISE PROGRAM: Access Code: [email or web address] URL: [email or web address] Date: 07/04/2024 Prepared by: Daved Bull  Exercises - Supine March  - 1 x daily - 7 x weekly - 3 sets - 10 reps - Supine Posterior Pelvic Tilt  - 1 x daily - 7 x weekly - 3 sets - 10 reps - Supine Ankle Pumps  - 1 x daily - 7 x weekly - 3 sets - 10 reps - Supine Heel Slide  - 1 x daily - 7 x weekly - 3 sets - 10 reps - Side Stepping with Resistance at Thighs and Counter Support  - 1 x daily - 5 x weekly - 3 sets - 10 reps -  Forward Backward Monster Walk with Band at [employer] and [employer]  - 1 x daily - 5 x weekly - 3 sets - 10 reps  Can try slow STS at home and daily walks in home w/ supervision 2-5 minutes on days where BP is better.  GOALS: Goals reviewed with patient? Yes  SHORT TERM GOALS: Target date: 07/22/2024  Pt will be independent and compliant with introductory strength and balance focused HEP in order to maintain functional progress and improve mobility. Baseline:  Initiated on eval. Goal status: MET  2.  Pt will decrease 5xSTS to </=18.03 seconds in order to demonstrate decreased risk for falls and improved functional bilateral LE strength and power. Baseline: 23.03 sec w/ int hand support, 19.38 sec with BUE support (10/23) Goal status: IN PROGRESS  3.  TUG to be assessed w/ STG set as appropriate. Baseline: 15.81 sec w/ rollator SBA (10/6), 13.75 sec with rollator SBA (10/23) Goal status: REVISED - LTG only  4.  Pt will ambulate>/=396 feet on to demonstrate improved endurance for functional tasks in home and community. Baseline: 59' w/ rollator SBA (10/6), 358 ft with rollator SBA (10/23) Goal status: IN PROGRESS  5.  Pt will  increase BERG balance score to >/=45/56 to demonstrate improved static balance. Baseline: 40/56 (10/6), 42/56 (10/23) Goal status: IN PROGRESS  LONG TERM GOALS: Target date: 08/19/2024  Pt will be independent and compliant with advanced and finalized strength and balance focused HEP in order to maintain functional progress and improve mobility. Baseline:  Goal status: INITIAL  2.  Pt will decrease 5xSTS to </=13.03 seconds in order to demonstrate decreased risk for falls and improved functional bilateral LE strength and power. Baseline: 23.03 sec w/ int hand support, 19.38 sec with BUE support (10/23) Goal status: INITIAL  3.  Pt will demonstrate TUG of </=12 seconds in order to decrease risk of falls and improve functional mobility using LRAD. Baseline: 15.81 sec w/ rollator SBA (10/6), 13.75 sec with rollator SBA (10/23) Goal status: INITIAL  4.  Pt will ambulate>/=400 feet on to demonstrate improved endurance for functional tasks in home and community. Baseline: 296' w/ rollator SBA (10/6), 358 ft with rollator SBA (10/23) Goal status: REVISED/DOWNGRADED  5.  Pt will increase BERG balance score to >/=50/56 to demonstrate improved static balance. Baseline: 40/56 (10/6), 42/56 (10/23) Goal status: INITIAL  6.  Pt will appropriately wean from soft cervical collar to improve postural awareness, ROM, and visual scanning of environment needed for upright stability. Baseline: Following up w/ managing MD Goal status: INITIAL  ASSESSMENT:  CLINICAL IMPRESSION: Focus of skilled PT session today on addressing standing balance w/ SPC as knee pain is better managed following steroid injection.  He is challenged by hurdle and 8 cones increasing SL demand.  His hips appear weak based on mild knee valgus noted in double and single limb stance.  PT to continue working on BLE strength, global endurance, and dynamic stability w/ LRAD to optimize independence and return to PLOF. Continue  POC.   OBJECTIVE IMPAIRMENTS: Abnormal gait, decreased activity tolerance, decreased balance, decreased coordination, decreased endurance, decreased mobility, difficulty walking, decreased strength, increased edema, impaired sensation, impaired tone, impaired UE functional use, improper body mechanics, postural dysfunction, and pain.   ACTIVITY LIMITATIONS: carrying, lifting, bending, standing, squatting, stairs, transfers, bathing, toileting, dressing, reach over head, and locomotion level  PARTICIPATION LIMITATIONS: meal prep, cleaning, laundry, medication management, driving, shopping, and community activity  PERSONAL FACTORS: Age, Fitness, Past/current experiences, Time  since onset of injury/illness/exacerbation, and 1-2 comorbidities: bilateral torn RTC, severe orthostatic hypotension are also affecting patient's functional outcome.   REHAB POTENTIAL: Good  CLINICAL DECISION MAKING: Evolving/moderate complexity  EVALUATION COMPLEXITY: Moderate  PLAN:  PT FREQUENCY: 1-2x/week  PT DURATION: 8 weeks  PLANNED INTERVENTIONS: [id number]- PT Re-evaluation, 97750- Physical Performance Testing, 97110-Therapeutic exercises, 97530- Therapeutic activity, [id number]- Neuromuscular re-education, 97535- Self Care, [id number]- Manual therapy, [id number]- Gait training, [phone]- Orthotic Initial, [phone]- Orthotic/Prosthetic subsequent, [phone]- Aquatic Therapy, [phone]- Electrical stimulation (manual), Patient/Family education, Balance training, Stair training, Taping, Joint mobilization, Vestibular training, DME instructions, Cryotherapy, and Moist heat  PLAN FOR NEXT SESSION: CHECK BP!  How is HEP?  Expand HEP for dynamic balance and strength.  SciFit for endurance - pt enjoys.  Bilateral shoulder pain management as needed.  Gait training.  L NMR/tone management.  How is R knee edema/pain?  Bilateral hip strengthening.  High hurdles.  Standing balance on decline/incline.  Step ups/downs.  Can consider aquatics if pt able to  make it 2x/wk? - may consider for knee pain!  Daved KATHEE Bull, PT, DPT  07/26/2024, 3:33 PM

## 2024-07-26 NOTE — Patient Instructions (Addendum)
 Flexion (Passive)    Sitting upright, slide both hands forward along table together (folded towel underneath arms) and back, bending from the waist until a stretch is felt. Repeat __10__ times. Can then do side to side x 10, then big circles clockwise and counterclockwise x 10. Do _2___ sessions per [place].   MP Flexion (Active)    Bend large knuckles as far as they will go, keeping finger joints straight. Keep wrist straight Repeat __10__ times. Do __2__ sessions per [place].    PIP Extension (Passive    Use thumb of other hand on top of joint and two fingers under- neath on either side to straighten middle joint of _Lt index_____ finger. Keep some bend to big knuckle (unlike picture) Hold __10__ seconds. Repeat _5___ times. Do __2__ sessions per [place].

## 2024-07-26 NOTE — Therapy (Signed)
 OUTPATIENT OCCUPATIONAL THERAPY NEURO TREATMENT  Patient Name: Jerome Vaughn MRN: [id number] DOB:01/20/1951, 73 y.o., male Today's Date: 07/26/2024  PCP: [name] LABOR, MD REFERRING PROVIDER: [name] PARAS, PA-C  END OF SESSION:  OT End of Session - 07/26/24 1449     Visit Number 6    Number of Visits 16    Date for Recertification  08/22/24    Authorization Type [facility] - form submitted    Authorization Time Period 06/22/24-08/29/24    Authorization - Visit Number 6    Authorization - Number of Visits 16    Progress Note Due on Visit 10    OT Start Time 1445    OT Stop Time 1530    OT Time Calculation (min) 45 min    Equipment Utilized During Treatment rocker knife, green putty, testing materials    Activity Tolerance Patient tolerated treatment well    Behavior During Therapy WFL for tasks assessed/performed             Past Medical History:  Diagnosis Date   Carpal tunnel syndrome    both hands   Colon polyps    Coronary artery disease    sees Dr. [name]   Diabetes mellitus    sees Dr. [name]   Diabetes mellitus [facility]) 2009   Ganglion cyst of wrist, right 1989   GERD (gastroesophageal reflux disease)    History of echocardiogram    a. Echo 4/17: Moderate LVH, EF 50-55%, mild LAE, no pericardial effusion   Hyperlipidemia    Hypertension    MGUS (monoclonal gammopathy of unknown significance)    sees Dr. [name]   Neuropathy    gets foot exams with Dr. [name] (podiatry)   Pneumonia    Rotator cuff injury    Past Surgical History:  Procedure Laterality Date   ANTERIOR CERVICAL DECOMP/DISCECTOMY FUSION N/A 04/19/2024   Procedure: ANTERIOR CERVICAL DECOMPRESSION/DISCECTOMY FUSION CERVICAL SIX-SEVEN;  Surgeon: [name] MATSU, MD;  Location: [facility] OR;  Service: Neurosurgery;  Laterality: N/A;  ACDF C67   CARDIAC CATHETERIZATION N/A 11/21/2015   Procedure: Left Heart Cath and Coronary Angiography;  Surgeon: [name], MD;   Location: [facility] INVASIVE CV LAB;  Service: Cardiovascular;  Laterality: N/A;   COLONOSCOPY  03/18/2024   per Dr. [name], adenomatous polyp, repeat in 5 yrs   CORONARY ARTERY BYPASS GRAFT N/A 11/22/2015   Procedure: CORONARY ARTERY BYPASS GRAFTING (CABG) times five using the left internal mammary, right greater saphenous vein EVH, and left thigh greater saphenous vein EVH;  Surgeon: [name], MD;  Location: [facility] OR;  Service: Open Heart Surgery;  Laterality: N/A;   CORONARY ARTERY BYPASS GRAFT  2017   CORONARY PRESSURE/FFR STUDY N/A 12/14/2018   Procedure: INTRAVASCULAR PRESSURE WIRE/FFR STUDY;  Surgeon: [name], MD;  Location: [facility] INVASIVE CV LAB;  Service: Cardiovascular;  Laterality: N/A;   ESOPHAGOGASTRODUODENOSCOPY  03/18/2024   per Dr. [name], normal   frozen shoulder release Right    GANGLION CYST EXCISION Right 1989   LEFT HEART CATH AND CORS/GRAFTS ANGIOGRAPHY N/A 10/27/2017   Procedure: LEFT HEART CATH AND CORS/GRAFTS ANGIOGRAPHY;  Surgeon: Sharps [name], MD;  Location: [name];  Service: Cardiovascular;  Laterality: N/A;   LEFT HEART CATH AND CORS/GRAFTS ANGIOGRAPHY N/A 12/14/2018   Procedure: LEFT HEART CATH AND CORS/GRAFTS ANGIOGRAPHY;  Surgeon: [name], MD;  Location: [facility] INVASIVE CV LAB;  Service: Cardiovascular;  Laterality: N/A;   TEE WITHOUT CARDIOVERSION N/A  11/22/2015   Procedure: TRANSESOPHAGEAL ECHOCARDIOGRAM (TEE);  Surgeon: [name], MD;  Location: [facility] OR;  Service: Open Heart Surgery;  Laterality: N/A;   WRIST GANGLION EXCISION Right    Patient Active Problem List   Diagnosis Date Noted   Fall 05/27/2024   Orthostatic hypotension 05/27/2024   Post-traumatic spasticity 05/27/2024   Coping style affecting medical condition 05/04/2024   Central cord syndrome (HCC) 04/21/2024   Acute incomplete quadriplegia (HCC) 04/21/2024   Shock (HCC) 04/15/2024   Varicose veins of both lower extremities 07/03/2023   Full  thickness rotator cuff tear 02/26/2023   Type 2 diabetes mellitus with diabetic polyneuropathy, without long-term current use of insulin  (HCC) 02/02/2023   Pancytopenia (HCC) 01/09/2023   OA (osteoarthritis) of knee 04/18/2022   Left shoulder pain 04/18/2022   GERD (gastroesophageal reflux disease)    Bacterial infection due to H. pylori 03/01/2020   Monoclonal gammopathy of unknown significance (MGUS) 03/01/2020   Iron  deficiency anemia 02/22/2020   Onychomycosis 02/08/2020   Rectal bleeding 01/24/2020   Indigestion 01/24/2020   Angina pectoris 12/12/2018   Erectile disorder due to medical condition in male 04/08/2018   Chronic combined systolic (congestive) and diastolic (congestive) heart failure (HCC) 07/29/2017   Pericardial effusion 12/24/2015   S/P CABG x 5 11/22/2015   Abnormal stress test 11/21/2015   Coronary artery disease of bypass graft of native heart with stable angina pectoris    Nonspecific abnormal electrocardiogram (ECG) (EKG) 11/07/2015   Essential hypertension 03/30/2011   Hypercholesterolemia 03/30/2011   Colon polyp 03/30/2011    ONSET DATE: 05/17/2024 (referral date)    REFERRING DIAG: G82.50 (ICD-10-CM) - Quadriplegia, unspecified S14.129A (ICD-10-CM) - Central cord syndrome, initial encounter (HCC)    THERAPY DIAG:  Other lack of coordination  Muscle weakness (generalized)  Other symptoms and signs involving the musculoskeletal system  Other symptoms and signs involving the nervous system  Rationale for Evaluation and Treatment: Rehabilitation  SUBJECTIVE:   SUBJECTIVE STATEMENT: Pt reports hands seem better and Rt shoulder getting better Pt accompanied by: [name]   PERTINENT HISTORY: Presented 04/15/2024 after being on a roof cutting a tree branch when the tree fell on him pinning him against the roof. Pt underwent ACDF C6-7 on 04/19/24. No loss of consciousness. PMH:  significant for diabetes mellitus, CAD with CABG 2017,  hypertension, hyperlipidemia  IMPRESSION from MRI on 04/16/24: 1. No acute fracture or ligamentous injury of the cervical spine. 2. Focal hyperintense T2-weighted signal within the spinal cord at the C3-4 levels and at C7, which may indicate myelomalacia or spinal cord edema. 3. Severe spinal canal stenosis and bilateral neural foraminal stenosis at C6-7. 4. Mild spinal canal stenosis and severe bilateral neural foraminal stenosis at C3-4. 5. Moderate left C2-3, left C4-5 and bilateral C5-6 neural foraminal stenosis.  PRECAUTIONS: Cervical, Fall, and Other: soft collar, orthostatic hypotension (wears abdominal binder and TED hoses), lifting restrictions   WEIGHT BEARING RESTRICTIONS: ? UE wt bearing  PAIN:  Are you having pain? Yes: NPRS scale: 3/10 at rest at R knee Pain location: R knee Pain description: stiffness  Aggravating factors: certain positions Relieving factors: changing positions, OTC meds and muscle relaxers  FALLS: Has patient fallen in last 6 months? Yes. 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  HAND DOMINANCE: Right  ADLs: Transfers/ambulation related to ADLs: rollator Eating: mod I using Rt hand mostly with adapted utensils, wife cuts food Grooming: mod I for brushing teeth/shaving using both hands, except wife brushes pt's hair UB Dressing: min assist - improving LB Dressing: max assist for TED hose, fluctuating assist for  pants/shoes Toileting: supervision and assist for wiping Bathing: assist w/ lower legs and back  Tub Shower transfers: min assist Equipment: Shower seat with back and Grab bars  IADLs:  Shopping: pt has gone with wife 2x since d/c from hospital Light housekeeping: dependent Meal Prep: dependent Community mobility: dependent Medication management: wife places in pillbox, wife assists Handwriting: 90% legible in print (pt reports signature was always bad)   MOBILITY STATUS: using rollator   UPPER EXTREMITY ROM:  RUE AROM at shoulder and elbow WFL's. Limited more distally with 75% wrist and 50% composite flexion  LUE shoulder movement limited in approx 45* scaption (lesser true flex), 75% ER, 90% IR, Elbows distally WFLs w/ difficulty w/ index PIP extension   UPPER EXTREMITY MMT:   not tested d/t precautions, current deficits and premorbid rotator cuff tears bilaterally    HAND FUNCTION: Grip strength: Right: 5.2 lbs; Left: 29.3 lbs  COORDINATION: 9 Hole Peg test: Right: placed 6 in 2 min;   Left: 117.83 sec Box and Blocks:  Right 31 blocks, Left 35 blocks  SENSATION: Light touch: WFL Pt reports some numbness in fingertips (premorbid but more pronounced since accident)   EDEMA: mild Rt hand, slightly worse in fingers Rt hand  MUSCLE TONE: pt has reported spasms and on muscle relaxer's (take at night)   COGNITION: Overall cognitive status: Within functional limits for tasks assessed  VISION: Subjective report: Pt reports no changes  Baseline vision: Bifocals Visual history: none    PERCEPTION: Not tested  PRAXIS: Not tested  OBSERVATIONS: bilateral hand deficits (Rt worse than Lt), bilateral shoulder deficits (Lt worse than Rt)  Premorbid rotator cuff tears but pt reports he had majority of ROM both shoulders prior to accident - now limited in ROM Lt shoulder                                                                                                                              TREATMENT DATE: 07/26/24  Pt issued additional ex for shoulder (table slides) and return demo of each x 10 reps Pt also issued MP flexion ex for bilateral hands, and PIP extension stretch for index fingers - see pt instructions for details.   Assessed further progress towards goals - see below Pt also reports that he has ordered rocker knife and has loofah on a stick to assist with bathing.  Pt reports getting shirts on are easier now  Pt placing large pegs in pegboard Rt and Lt hand, then removing by manipulating from fingertips to/from palm (up to 3) w/ mod to max difficulty going from palm to fingertips Rt hand     PATIENT EDUCATION: Education details: SEE ABOVE Person educated: Patient and Spouse Education method: Explanation, Verbal cues, and Handouts Education comprehension: verbalized understanding and returned demonstration  HOME EXERCISE PROGRAM: 07/04/24: yellow theraputty HEP (ACCESS CODE [id number]), coordination    GOALS: Goals reviewed with patient? Yes  SHORT TERM GOALS: Target date: 07/22/24  Pt independent with HEP for bilateral coordination and grip strength Baseline:  Goal status: MET   2.  Pt independent with modified shoulder HEP (premorbid rotator cuff tear)  Baseline:  Goal status: MET   3.  Pt to consistently be mod I for donning/doffing overhead shirts and UB bathing w/ AE prn Baseline:  07/21/24: Pt reports donning/doffing UB short sleeve shirts independently/Mod I, needs Min A for donning long sleeves (adjustments at sleeves only). Pt also educated in and looking to obtain long handled sponge. Goal status: IN PROGRESS  4.  Pt to perform LE dressing w/ min assist using AE prn (w/ exception to TED hoses)  Baseline: mod to max assist 07/21/24: occasional assistance for pants at this time, doing his own shoes Goal status: MET  5.  Grip strength to increase by 10 lbs or more on Rt side and 5 lbs or more on Lt side Baseline: 5.2 lbs,  29.3 lbs 07/21/24: 13.0 lbs, 29.1 lbs Goal status: IN PROGRESS  6.  Improve BUE function as evidenced by increasing Box & Blocks by 5 or more blocks Baseline: Rt = 31, Lt = 35 07/21/24: R: 36, L: 39 Goal status: IN PROGRESS (met on Rt, almost met on Lt)     LONG TERM GOALS: Target date: 08/22/24  Independent with updated HEP  Baseline:  Goal status: IN PROGRESS  2.  Pt to be mod I for all BADLS including brushing hair and LB dressing (exception TED hoses)  Baseline:  07/21/24: Pt reports improved grooming and is obtaining long handled comb, pt reports occasional assistance with pants especially when already wearing compression stockings Goal status: IN PROGRESS  3.  Improve Rt hand coordination as evidenced by reducing speed on 9 hole peg test in under 2 minutes Baseline:  07/21/24: 1 minutes, 24 seconds Goal status: GOAL MET  4.  Improve Lt hand coordination as evidenced by reducing speed on 9 hole peg test in under 2 minutes Baseline:  07/21/24: 1 minute, 29 seconds Goal status: GOAL MET  5.  Pt to perform light IADLS in standing with countertop support w/o LOB  Baseline:  Goal status: INITIAL  6.  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 Pt reports improvements in hands and Rt shoulder. Pt has 3/6 STG's at this time and close to meeting STG #6. SABRA Pt would benefit from continued skilled services to continue to improve ability to complete ADLs and improve UE strength for functional transfers and to reduce caregiver burden.  PERFORMANCE DEFICITS: in functional skills including ADLs, IADLs, coordination, dexterity, sensation, edema, ROM, strength, pain,  flexibility, Fine motor control, Gross  motor control, mobility, body mechanics, cardiopulmonary status limiting function, decreased knowledge of precautions, decreased knowledge of use of DME, and UE functional use.   IMPAIRMENTS: are limiting patient from ADLs, IADLs, rest and sleep, work, leisure, and social participation.   CO-MORBIDITIES: has co-morbidities such as orthostatic hypotension, bilateral rotator cuff tears that affects occupational performance. Patient will benefit from skilled OT to address above impairments and improve overall function.  MODIFICATION OR ASSISTANCE TO COMPLETE EVALUATION: Min-Moderate modification of tasks or assist with assess necessary to complete an evaluation.  OT OCCUPATIONAL PROFILE AND HISTORY: Detailed assessment: Review of records and additional review of physical, cognitive, psychosocial history related to current functional performance.  CLINICAL DECISION MAKING: Moderate - several treatment options, min-mod task modification necessary  REHAB POTENTIAL: Good  EVALUATION COMPLEXITY: Moderate    PLAN:  OT FREQUENCY: 2x/week  OT DURATION: 8 weeks  PLANNED INTERVENTIONS: 97535 self care/ADL training, 02889 therapeutic exercise, 97530 therapeutic activity, 97112 neuromuscular re-education, 97140 manual therapy, 97113 aquatic therapy, 97018 paraffin, 02960 fluidotherapy, 97010 moist heat, 97760 Orthotic Initial, 97763 Orthotic/Prosthetic subsequent, passive range of motion, functional mobility training, patient/family education, and DME and/or AE instructions  RECOMMENDED OTHER SERVICES: none at this time  CONSULTED AND AGREED WITH PLAN OF CARE: Patient and family member/caregiver  PLAN FOR NEXT SESSION:  Coordination and grip strength activities, try isolated finger tapping ex's in prep for typing, Connect 4 for mid level reaching and coordination  Monitor gross grasp and need for Rt resting hand splint     Burnard JINNY Roads, OT 07/26/2024,  2:50 PM

## 2024-07-28 ENCOUNTER — Ambulatory Visit

## 2024-07-28 ENCOUNTER — Ambulatory Visit: Admitting: Physical Therapy

## 2024-07-28 DIAGNOSIS — M6281 Muscle weakness (generalized): Secondary | ICD-10-CM | POA: Diagnosis not present

## 2024-07-28 DIAGNOSIS — M62838 Other muscle spasm: Secondary | ICD-10-CM | POA: Diagnosis not present

## 2024-07-28 DIAGNOSIS — R29818 Other symptoms and signs involving the nervous system: Secondary | ICD-10-CM | POA: Diagnosis not present

## 2024-07-28 DIAGNOSIS — R29898 Other symptoms and signs involving the musculoskeletal system: Secondary | ICD-10-CM | POA: Diagnosis not present

## 2024-07-28 DIAGNOSIS — G8252 Quadriplegia, C1-C4 incomplete: Secondary | ICD-10-CM | POA: Diagnosis not present

## 2024-07-28 DIAGNOSIS — R278 Other lack of coordination: Secondary | ICD-10-CM | POA: Diagnosis not present

## 2024-07-28 DIAGNOSIS — R2689 Other abnormalities of gait and mobility: Secondary | ICD-10-CM | POA: Diagnosis not present

## 2024-07-28 DIAGNOSIS — R293 Abnormal posture: Secondary | ICD-10-CM | POA: Diagnosis not present

## 2024-07-28 DIAGNOSIS — R208 Other disturbances of skin sensation: Secondary | ICD-10-CM | POA: Diagnosis not present

## 2024-07-28 NOTE — Therapy (Signed)
 OUTPATIENT OCCUPATIONAL THERAPY NEURO TREATMENT  Patient Name: Jerome Vaughn MRN: [id number] DOB:Jul 06, 1951, 73 y.o., male Today's Date: 07/28/2024  PCP: [name] LABOR, MD REFERRING PROVIDER: [name] PARAS, PA-C  END OF SESSION:  OT End of Session - 07/28/24 1452     Visit Number 7    Number of Visits 16    Date for Recertification  08/22/24    Authorization Type [facility] - form submitted    Authorization Time Period 06/22/24-08/29/24    Authorization - Visit Number 7    Authorization - Number of Visits 16    Progress Note Due on Visit 10    OT Start Time 1450    OT Stop Time 1534    OT Time Calculation (min) 44 min    Equipment Utilized During Treatment cards, board games    Activity Tolerance Patient tolerated treatment well    Behavior During Therapy [facility] for tasks assessed/performed             Past Medical History:  Diagnosis Date   Carpal tunnel syndrome    both hands   Colon polyps    Coronary artery disease    sees Dr. [name]   Diabetes mellitus    sees Dr. [name]   Diabetes mellitus [facility]) 2009   Ganglion cyst of wrist, right 1989   GERD (gastroesophageal reflux disease)    History of echocardiogram    a. Echo 4/17: Moderate LVH, EF 50-55%, mild LAE, no pericardial effusion   Hyperlipidemia    Hypertension    MGUS (monoclonal gammopathy of unknown significance)    sees Dr. [name]   Neuropathy    gets foot exams with Dr. [name] (podiatry)   Pneumonia    Rotator cuff injury    Past Surgical History:  Procedure Laterality Date   ANTERIOR CERVICAL DECOMP/DISCECTOMY FUSION N/A 04/19/2024   Procedure: ANTERIOR CERVICAL DECOMPRESSION/DISCECTOMY FUSION CERVICAL SIX-SEVEN;  Surgeon: [name] MATSU, MD;  Location: [facility] OR;  Service: Neurosurgery;  Laterality: N/A;  ACDF C67   CARDIAC CATHETERIZATION N/A 11/21/2015   Procedure: Left Heart Cath and Coronary Angiography;  Surgeon: [name], MD;  Location: [facility] INVASIVE CV LAB;   Service: Cardiovascular;  Laterality: N/A;   COLONOSCOPY  03/18/2024   per Dr. [name], adenomatous polyp, repeat in 5 yrs   CORONARY ARTERY BYPASS GRAFT N/A 11/22/2015   Procedure: CORONARY ARTERY BYPASS GRAFTING (CABG) times five using the left internal mammary, right greater saphenous vein EVH, and left thigh greater saphenous vein EVH;  Surgeon: [name], MD;  Location: [facility] OR;  Service: Open Heart Surgery;  Laterality: N/A;   CORONARY ARTERY BYPASS GRAFT  2017   CORONARY PRESSURE/FFR STUDY N/A 12/14/2018   Procedure: INTRAVASCULAR PRESSURE WIRE/FFR STUDY;  Surgeon: [name], MD;  Location: [facility] INVASIVE CV LAB;  Service: Cardiovascular;  Laterality: N/A;   ESOPHAGOGASTRODUODENOSCOPY  03/18/2024   per Dr. [name], normal   frozen shoulder release Right    GANGLION CYST EXCISION Right 1989   LEFT HEART CATH AND CORS/GRAFTS ANGIOGRAPHY N/A 10/27/2017   Procedure: LEFT HEART CATH AND CORS/GRAFTS ANGIOGRAPHY;  Surgeon: Sharps [name], MD;  Location: [name];  Service: Cardiovascular;  Laterality: N/A;   LEFT HEART CATH AND CORS/GRAFTS ANGIOGRAPHY N/A 12/14/2018   Procedure: LEFT HEART CATH AND CORS/GRAFTS ANGIOGRAPHY;  Surgeon: [name], MD;  Location: [facility] INVASIVE CV LAB;  Service: Cardiovascular;  Laterality: N/A;   TEE WITHOUT CARDIOVERSION N/A 11/22/2015  Procedure: TRANSESOPHAGEAL ECHOCARDIOGRAM (TEE);  Surgeon: [name], MD;  Location: [facility] OR;  Service: Open Heart Surgery;  Laterality: N/A;   WRIST GANGLION EXCISION Right    Patient Active Problem List   Diagnosis Date Noted   Fall 05/27/2024   Orthostatic hypotension 05/27/2024   Post-traumatic spasticity 05/27/2024   Coping style affecting medical condition 05/04/2024   Central cord syndrome (HCC) 04/21/2024   Acute incomplete quadriplegia (HCC) 04/21/2024   Shock (HCC) 04/15/2024   Varicose veins of both lower extremities 07/03/2023   Full thickness rotator cuff tear 02/26/2023    Type 2 diabetes mellitus with diabetic polyneuropathy, without long-term current use of insulin  (HCC) 02/02/2023   Pancytopenia (HCC) 01/09/2023   OA (osteoarthritis) of knee 04/18/2022   Left shoulder pain 04/18/2022   GERD (gastroesophageal reflux disease)    Bacterial infection due to H. pylori 03/01/2020   Monoclonal gammopathy of unknown significance (MGUS) 03/01/2020   Iron  deficiency anemia 02/22/2020   Onychomycosis 02/08/2020   Rectal bleeding 01/24/2020   Indigestion 01/24/2020   Angina pectoris 12/12/2018   Erectile disorder due to medical condition in male 04/08/2018   Chronic combined systolic (congestive) and diastolic (congestive) heart failure (HCC) 07/29/2017   Pericardial effusion 12/24/2015   S/P CABG x 5 11/22/2015   Abnormal stress test 11/21/2015   Coronary artery disease of bypass graft of native heart with stable angina pectoris    Nonspecific abnormal electrocardiogram (ECG) (EKG) 11/07/2015   Essential hypertension 03/30/2011   Hypercholesterolemia 03/30/2011   Colon polyp 03/30/2011    ONSET DATE: 05/17/2024 (referral date)    REFERRING DIAG: G82.50 (ICD-10-CM) - Quadriplegia, unspecified S14.129A (ICD-10-CM) - Central cord syndrome, initial encounter (HCC)    THERAPY DIAG:  Other lack of coordination  Muscle weakness (generalized)  Other symptoms and signs involving the musculoskeletal system  Rationale for Evaluation and Treatment: Rehabilitation  SUBJECTIVE:   SUBJECTIVE STATEMENT: Pt reports pain in knee, states no pain when at rest but about a 7/10 when standing Pt accompanied by: [name]   PERTINENT HISTORY: Presented 04/15/2024 after being on a roof cutting a tree branch when the tree fell on him pinning him against the roof. Pt underwent ACDF C6-7 on 04/19/24. No loss of consciousness. PMH:  significant for diabetes mellitus, CAD with CABG 2017, hypertension, hyperlipidemia  IMPRESSION from MRI on 04/16/24: 1. No acute  fracture or ligamentous injury of the cervical spine. 2. Focal hyperintense T2-weighted signal within the spinal cord at the C3-4 levels and at C7, which may indicate myelomalacia or spinal cord edema. 3. Severe spinal canal stenosis and bilateral neural foraminal stenosis at C6-7. 4. Mild spinal canal stenosis and severe bilateral neural foraminal stenosis at C3-4. 5. Moderate left C2-3, left C4-5 and bilateral C5-6 neural foraminal stenosis.  PRECAUTIONS: Cervical, Fall, and Other: soft collar, orthostatic hypotension (wears abdominal binder and TED hoses), lifting restrictions   WEIGHT BEARING RESTRICTIONS: ? UE wt bearing  PAIN:  Are you having pain? Yes: NPRS scale: 7/10 with activity at R knee Pain location: R knee Pain description: stiffness  Aggravating factors: standing Relieving factors: changing positions, OTC meds and muscle relaxers  FALLS: Has patient fallen in last 6 months? Yes. 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  HAND DOMINANCE: Right  ADLs: Transfers/ambulation related to ADLs: rollator Eating: mod I using Rt hand mostly with adapted utensils, wife cuts food Grooming: mod I for brushing teeth/shaving using both hands, except wife brushes pt's hair UB Dressing: min assist - improving LB Dressing: max assist for TED hose, fluctuating assist for pants/shoes Toileting: supervision and assist for wiping Bathing: assist w/ lower legs  and back  Tub Shower transfers: min assist Equipment: Shower seat with back and Grab bars  IADLs:  Shopping: pt has gone with wife 2x since d/c from hospital Light housekeeping: dependent Meal Prep: dependent Community mobility: dependent Medication management: wife places in pillbox, wife assists Handwriting: 90% legible in print (pt reports signature was always bad)   MOBILITY STATUS: using rollator   UPPER EXTREMITY ROM:  RUE AROM at shoulder and elbow WFL's. Limited more distally with 75% wrist and 50% composite flexion  LUE shoulder movement limited in approx 45* scaption (lesser true flex), 75% ER, 90% IR, Elbows distally WFLs w/ difficulty w/ index PIP extension   UPPER EXTREMITY MMT:   not tested d/t precautions, current deficits and premorbid rotator cuff tears bilaterally    HAND FUNCTION: Grip strength: Right: 5.2 lbs; Left: 29.3 lbs  COORDINATION: 9 Hole Peg test: Right: placed 6 in 2 min;   Left: 117.83 sec Box and Blocks:  Right 31 blocks, Left 35 blocks  SENSATION: Light touch: WFL Pt reports some numbness in fingertips (premorbid but more pronounced since accident)   EDEMA: mild Rt hand, slightly worse in fingers Rt hand  MUSCLE TONE: pt has reported spasms and on muscle relaxer's (take at night)   COGNITION: Overall cognitive status: Within functional limits for tasks assessed  VISION: Subjective report: Pt reports no changes  Baseline vision: Bifocals Visual history: none    PERCEPTION: Not tested  PRAXIS: Not tested  OBSERVATIONS: bilateral hand deficits (Rt worse than Lt), bilateral shoulder deficits (Lt worse than Rt)  Premorbid rotator cuff tears but pt reports he had majority of ROM both shoulders prior to accident - now limited in ROM Lt shoulder                                                                                                                             TREATMENT DATE: 07/28/24 Pt instructed in HEP for isolated finger  movements for carryover with typing and ADLs requiring FM coordination. HEP prescribed, see Pt intsructions for detailed information.  Next engaged pt in FM coordination activities using games (Connect 4, Perfection) to manipulate small items in R hand to successfully participate in games and hone dexterity to carry over with ADL and typing tasks, as well as promote mid level reaching. Min cues were required to utilize R  hand.   Pt also participated in card game, picking up cards with R hand. Occasional difficulty with picking up cards from deck, but participated well.      PATIENT EDUCATION: Education details: SEE ABOVE Person educated: Patient and Spouse Education method: Explanation, Verbal cues, and Handouts Education comprehension: verbalized understanding and returned demonstration  HOME EXERCISE PROGRAM: 07/04/24: yellow theraputty HEP (ACCESS CODE [id number]), coordination 07/28/24: Isolated finger movements (ACCESS CODE [id number])    GOALS: Goals reviewed with patient? Yes  SHORT TERM GOALS: Target date: 07/22/24  Pt independent with HEP for bilateral coordination and grip strength Baseline:  Goal status: MET   2.  Pt independent with modified shoulder HEP (premorbid rotator cuff tear)  Baseline:  Goal status: MET   3.  Pt to consistently be mod I for donning/doffing overhead shirts and UB bathing w/ AE prn Baseline:  07/21/24: Pt reports donning/doffing UB short sleeve shirts independently/Mod I, needs Min A for donning long sleeves (adjustments at sleeves only). Pt also educated in and looking to obtain long handled sponge. Goal status: IN PROGRESS  4.  Pt to perform LE dressing w/ min assist using AE prn (w/ exception to TED hoses)  Baseline: mod to max assist 07/21/24: occasional assistance for pants at this time, doing his own shoes Goal status: MET  5.  Grip strength to increase by 10 lbs or more on Rt side and 5 lbs or more on Lt side Baseline: 5.2 lbs, 29.3  lbs 07/21/24: 13.0 lbs, 29.1 lbs Goal status: IN PROGRESS  6.  Improve BUE function as evidenced by increasing Box & Blocks by 5 or more blocks Baseline: Rt = 31, Lt = 35 07/21/24: R: 36, L: 39 Goal status: IN PROGRESS (met on Rt, almost met on Lt)     LONG TERM GOALS: Target date: 08/22/24  Independent with updated HEP  Baseline:  Goal status: IN PROGRESS  2.  Pt to be mod I for all BADLS including brushing hair and LB dressing (exception TED hoses)  Baseline:  07/21/24: Pt reports improved grooming and is obtaining long handled comb, pt reports occasional assistance with pants especially when already wearing compression stockings Goal status: IN PROGRESS  3.  Improve Rt hand coordination as evidenced by reducing speed on 9 hole peg test in under 2 minutes Baseline:  07/21/24: 1 minutes, 24 seconds Goal status: GOAL MET  4.  Improve Lt hand coordination as evidenced by reducing speed on 9 hole peg test in under 2 minutes Baseline:  07/21/24: 1 minute, 29 seconds Goal status: GOAL MET  5.  Pt to perform light IADLS in standing with countertop support w/o LOB  Baseline:  Goal status: INITIAL  6.  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 Pt reports improvements in hands and Rt shoulder. Pt participated well with isolated finger exercises and FM coordination tasks. Pt would benefit from continued skilled OT services to improve ability to complete ADL/IADL and reduce caregiver burden.  PERFORMANCE DEFICITS: in functional skills including ADLs, IADLs, coordination, dexterity, sensation, edema, ROM, strength, pain, flexibility, Fine motor control, Gross motor  control, mobility, body mechanics, cardiopulmonary status limiting function, decreased knowledge of precautions, decreased knowledge of use of DME, and  UE functional use.   IMPAIRMENTS: are limiting patient from ADLs, IADLs, rest and sleep, work, leisure, and social participation.   CO-MORBIDITIES: has co-morbidities such as orthostatic hypotension, bilateral rotator cuff tears that affects occupational performance. Patient will benefit from skilled OT to address above impairments and improve overall function.  MODIFICATION OR ASSISTANCE TO COMPLETE EVALUATION: Min-Moderate modification of tasks or assist with assess necessary to complete an evaluation.  OT OCCUPATIONAL PROFILE AND HISTORY: Detailed assessment: Review of records and additional review of physical, cognitive, psychosocial history related to current functional performance.  CLINICAL DECISION MAKING: Moderate - several treatment options, min-mod task modification necessary  REHAB POTENTIAL: Good  EVALUATION COMPLEXITY: Moderate    PLAN:  OT FREQUENCY: 2x/week  OT DURATION: 8 weeks  PLANNED INTERVENTIONS: 97535 self care/ADL training, 02889 therapeutic exercise, 97530 therapeutic activity, 97112 neuromuscular re-education, 97140 manual therapy, 97113 aquatic therapy, 97018 paraffin, 02960 fluidotherapy, 97010 moist heat, 97760 Orthotic Initial, 97763 Orthotic/Prosthetic subsequent, passive range of motion, functional mobility training, patient/family education, and DME and/or AE instructions  RECOMMENDED OTHER SERVICES: none at this time  CONSULTED AND AGREED WITH PLAN OF CARE: Patient and family member/caregiver  PLAN FOR NEXT SESSION:  Coordination and grip strength activities F/u isolated finger exercises Monitor gross grasp and need for Rt resting hand splint     Rocky Dutch, OT 07/28/2024, 3:39 PM

## 2024-07-29 ENCOUNTER — Encounter: Payer: Self-pay | Admitting: Physical Therapy

## 2024-07-29 ENCOUNTER — Other Ambulatory Visit: Payer: Self-pay | Admitting: Internal Medicine

## 2024-07-29 ENCOUNTER — Ambulatory Visit: Admitting: Physical Therapy

## 2024-07-29 VITALS — BP 121/71 | HR 90

## 2024-07-29 DIAGNOSIS — R29898 Other symptoms and signs involving the musculoskeletal system: Secondary | ICD-10-CM | POA: Diagnosis not present

## 2024-07-29 DIAGNOSIS — R293 Abnormal posture: Secondary | ICD-10-CM

## 2024-07-29 DIAGNOSIS — R208 Other disturbances of skin sensation: Secondary | ICD-10-CM

## 2024-07-29 DIAGNOSIS — M62838 Other muscle spasm: Secondary | ICD-10-CM | POA: Diagnosis not present

## 2024-07-29 DIAGNOSIS — G8252 Quadriplegia, C1-C4 incomplete: Secondary | ICD-10-CM | POA: Diagnosis not present

## 2024-07-29 DIAGNOSIS — R29818 Other symptoms and signs involving the nervous system: Secondary | ICD-10-CM | POA: Diagnosis not present

## 2024-07-29 DIAGNOSIS — M6281 Muscle weakness (generalized): Secondary | ICD-10-CM | POA: Diagnosis not present

## 2024-07-29 DIAGNOSIS — R2689 Other abnormalities of gait and mobility: Secondary | ICD-10-CM

## 2024-07-29 DIAGNOSIS — R278 Other lack of coordination: Secondary | ICD-10-CM | POA: Diagnosis not present
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
## 2024-07-30 ENCOUNTER — Other Ambulatory Visit: Payer: Self-pay | Admitting: Physical Medicine and Rehabilitation

## 2024-08-02 ENCOUNTER — Ambulatory Visit: Attending: Physician Assistant | Admitting: Occupational Therapy

## 2024-08-02 ENCOUNTER — Ambulatory Visit: Admitting: Physical Therapy

## 2024-08-02 ENCOUNTER — Encounter: Payer: Self-pay | Admitting: Occupational Therapy

## 2024-08-02 VITALS — BP 141/87 | HR 78

## 2024-08-02 DIAGNOSIS — G8929 Other chronic pain: Secondary | ICD-10-CM | POA: Insufficient documentation

## 2024-08-02 DIAGNOSIS — R29818 Other symptoms and signs involving the nervous system: Secondary | ICD-10-CM | POA: Diagnosis not present

## 2024-08-02 DIAGNOSIS — M6281 Muscle weakness (generalized): Secondary | ICD-10-CM

## 2024-08-02 DIAGNOSIS — R293 Abnormal posture: Secondary | ICD-10-CM | POA: Diagnosis not present

## 2024-08-02 DIAGNOSIS — R2689 Other abnormalities of gait and mobility: Secondary | ICD-10-CM | POA: Insufficient documentation

## 2024-08-02 DIAGNOSIS — R278 Other lack of coordination: Secondary | ICD-10-CM | POA: Diagnosis not present

## 2024-08-02 DIAGNOSIS — G8252 Quadriplegia, C1-C4 incomplete: Secondary | ICD-10-CM | POA: Insufficient documentation

## 2024-08-02 DIAGNOSIS — R29898 Other symptoms and signs involving the musculoskeletal system: Secondary | ICD-10-CM

## 2024-08-02 DIAGNOSIS — M62838 Other muscle spasm: Secondary | ICD-10-CM

## 2024-08-02 DIAGNOSIS — M25511 Pain in right shoulder: Secondary | ICD-10-CM | POA: Diagnosis not present

## 2024-08-02 DIAGNOSIS — M25512 Pain in left shoulder: Secondary | ICD-10-CM | POA: Insufficient documentation

## 2024-08-02 DIAGNOSIS — R208 Other disturbances of skin sensation: Secondary | ICD-10-CM | POA: Insufficient documentation
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
## 2024-08-02 NOTE — Therapy (Signed)
 OUTPATIENT OCCUPATIONAL THERAPY NEURO TREATMENT  Patient Name: Jerome Vaughn MRN: [id number] DOB:[birth year], 73 y.o., male Today's Date: 08/02/2024  PCP: [name] LABOR, MD REFERRING PROVIDER: [name] PARAS, PA-C  END OF SESSION:  OT End of Session - 08/02/24 1409     Visit Number 8    Number of Visits 16    Date for Recertification  08/22/24    Authorization Type [facility] - form submitted    Authorization Time Period 06/22/24-08/29/24    Authorization - Visit Number 8    Authorization - Number of Visits 16    Progress Note Due on Visit 10    OT Start Time 1403    OT Stop Time 1445    OT Time Calculation (min) 42 min    Equipment Utilized During Treatment cards, board games    Activity Tolerance Patient tolerated treatment well    Behavior During Therapy [facility] for tasks assessed/performed             Past Medical History:  Diagnosis Date   Carpal tunnel syndrome    both hands   Colon polyps    Coronary artery disease    sees Dr. [name]   Diabetes mellitus    sees Dr. [name]   Diabetes mellitus [facility]) 2009   Ganglion cyst of wrist, right 1989   GERD (gastroesophageal reflux disease)    History of echocardiogram    a. Echo 4/17: Moderate LVH, EF 50-55%, mild LAE, no pericardial effusion   Hyperlipidemia    Hypertension    MGUS (monoclonal gammopathy of unknown significance)    sees Dr. [name]   Neuropathy    gets foot exams with Dr. [name] (podiatry)   Pneumonia    Rotator cuff injury    Past Surgical History:  Procedure Laterality Date   ANTERIOR CERVICAL DECOMP/DISCECTOMY FUSION N/A 04/19/2024   Procedure: ANTERIOR CERVICAL DECOMPRESSION/DISCECTOMY FUSION CERVICAL SIX-SEVEN;  Surgeon: [name] MATSU, MD;  Location: [facility] OR;  Service: Neurosurgery;  Laterality: N/A;  ACDF C67   CARDIAC CATHETERIZATION N/A 11/21/2015   Procedure: Left Heart Cath and Coronary Angiography;  Surgeon: [name], MD;  Location: [facility] INVASIVE CV LAB;   Service: Cardiovascular;  Laterality: N/A;   COLONOSCOPY  03/18/2024   per Dr. [name], adenomatous polyp, repeat in 5 yrs   CORONARY ARTERY BYPASS GRAFT N/A 11/22/2015   Procedure: CORONARY ARTERY BYPASS GRAFTING (CABG) times five using the left internal mammary, right greater saphenous vein EVH, and left thigh greater saphenous vein EVH;  Surgeon: [name], MD;  Location: [facility] OR;  Service: Open Heart Surgery;  Laterality: N/A;   CORONARY ARTERY BYPASS GRAFT  2017   CORONARY PRESSURE/FFR STUDY N/A 12/14/2018   Procedure: INTRAVASCULAR PRESSURE WIRE/FFR STUDY;  Surgeon: [name], MD;  Location: [facility] INVASIVE CV LAB;  Service: Cardiovascular;  Laterality: N/A;   ESOPHAGOGASTRODUODENOSCOPY  03/18/2024   per Dr. [name], normal   frozen shoulder release Right    GANGLION CYST EXCISION Right 1989   LEFT HEART CATH AND CORS/GRAFTS ANGIOGRAPHY N/A 10/27/2017   Procedure: LEFT HEART CATH AND CORS/GRAFTS ANGIOGRAPHY;  Surgeon: Sharps [name], MD;  Location: [name];  Service: Cardiovascular;  Laterality: N/A;   LEFT HEART CATH AND CORS/GRAFTS ANGIOGRAPHY N/A 12/14/2018   Procedure: LEFT HEART CATH AND CORS/GRAFTS ANGIOGRAPHY;  Surgeon: [name], MD;  Location: [facility] INVASIVE CV LAB;  Service: Cardiovascular;  Laterality: N/A;   TEE WITHOUT CARDIOVERSION N/A 11/22/2015  Procedure: TRANSESOPHAGEAL ECHOCARDIOGRAM (TEE);  Surgeon: [name], MD;  Location: [facility] OR;  Service: Open Heart Surgery;  Laterality: N/A;   WRIST GANGLION EXCISION Right    Patient Active Problem List   Diagnosis Date Noted   Fall 05/27/2024   Orthostatic hypotension 05/27/2024   Post-traumatic spasticity 05/27/2024   Coping style affecting medical condition 05/04/2024   Central cord syndrome (HCC) 04/21/2024   Acute incomplete quadriplegia (HCC) 04/21/2024   Shock (HCC) 04/15/2024   Varicose veins of both lower extremities 07/03/2023   Full thickness rotator cuff tear 02/26/2023    Type 2 diabetes mellitus with diabetic polyneuropathy, without long-term current use of insulin  (HCC) 02/02/2023   Pancytopenia (HCC) 01/09/2023   OA (osteoarthritis) of knee 04/18/2022   Left shoulder pain 04/18/2022   GERD (gastroesophageal reflux disease)    Bacterial infection due to H. pylori 03/01/2020   Monoclonal gammopathy of unknown significance (MGUS) 03/01/2020   Iron  deficiency anemia 02/22/2020   Onychomycosis 02/08/2020   Rectal bleeding 01/24/2020   Indigestion 01/24/2020   Angina pectoris 12/12/2018   Erectile disorder due to medical condition in male 04/08/2018   Chronic combined systolic (congestive) and diastolic (congestive) heart failure (HCC) 07/29/2017   Pericardial effusion 12/24/2015   S/P CABG x 5 11/22/2015   Abnormal stress test 11/21/2015   Coronary artery disease of bypass graft of native heart with stable angina pectoris    Nonspecific abnormal electrocardiogram (ECG) (EKG) 11/07/2015   Essential hypertension 03/30/2011   Hypercholesterolemia 03/30/2011   Colon polyp 03/30/2011    ONSET DATE: 05/17/2024 (referral date)    REFERRING DIAG: G82.50 (ICD-10-CM) - Quadriplegia, unspecified S14.129A (ICD-10-CM) - Central cord syndrome, initial encounter (HCC)    THERAPY DIAG:  Other lack of coordination  Muscle weakness (generalized)  Other symptoms and signs involving the musculoskeletal system  Other symptoms and signs involving the nervous system  Abnormal posture  Rationale for Evaluation and Treatment: Rehabilitation  SUBJECTIVE:   SUBJECTIVE STATEMENT: Pt reports worse tingling in hands but reports neuropathy bad in hands even prior to injury. Sleep position can make it better or worse Pt accompanied by: [name]   PERTINENT HISTORY: Presented 04/15/2024 after being on a roof cutting a tree branch when the tree fell on him pinning him against the roof. Pt underwent ACDF C6-7 on 04/19/24. No loss of consciousness. PMH:   significant for diabetes mellitus, CAD with CABG 2017, hypertension, hyperlipidemia  IMPRESSION from MRI on 04/16/24: 1. No acute fracture or ligamentous injury of the cervical spine. 2. Focal hyperintense T2-weighted signal within the spinal cord at the C3-4 levels and at C7, which may indicate myelomalacia or spinal cord edema. 3. Severe spinal canal stenosis and bilateral neural foraminal stenosis at C6-7. 4. Mild spinal canal stenosis and severe bilateral neural foraminal stenosis at C3-4. 5. Moderate left C2-3, left C4-5 and bilateral C5-6 neural foraminal stenosis.  PRECAUTIONS: Cervical, Fall, and Other: soft collar, orthostatic hypotension (wears abdominal binder and TED hoses), lifting restrictions   WEIGHT BEARING RESTRICTIONS: ? UE wt bearing  PAIN:  Are you having pain? Yes: NPRS scale: 7/10 with activity at R knee Pain location: R knee Pain description: stiffness  Aggravating factors: standing Relieving factors: changing positions, OTC meds and muscle relaxers  FALLS: Has patient fallen in last 6 months? Yes. 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  HAND DOMINANCE: Right  ADLs: Transfers/ambulation related to ADLs: rollator Eating: mod I using Rt hand mostly with adapted utensils, wife cuts food Grooming: mod I for brushing teeth/shaving using both hands, except wife brushes pt's hair UB Dressing: min assist - improving LB Dressing: max  assist for TED hose, fluctuating assist for pants/shoes Toileting: supervision and assist for wiping Bathing: assist w/ lower legs and back  Tub Shower transfers: min assist Equipment: Shower seat with back and Grab bars  IADLs:  Shopping: pt has gone with wife 2x since d/c from hospital Light housekeeping: dependent Meal Prep: dependent Community mobility: dependent Medication management: wife places in pillbox, wife assists Handwriting: 90% legible in print (pt reports signature was always bad)   MOBILITY STATUS: using rollator   UPPER EXTREMITY ROM:  RUE AROM at shoulder and elbow WFL's. Limited more distally with 75% wrist and 50% composite flexion  LUE shoulder movement limited in approx 45* scaption (lesser true flex), 75% ER, 90% IR, Elbows distally WFLs w/ difficulty w/ index PIP extension   UPPER EXTREMITY MMT:   not tested d/t precautions, current deficits and premorbid rotator cuff tears bilaterally    HAND FUNCTION: Grip strength: Right: 5.2 lbs; Left: 29.3 lbs  COORDINATION: 9 Hole Peg test: Right: placed 6 in 2 min;   Left: 117.83 sec Box and Blocks:  Right 31 blocks, Left 35 blocks  SENSATION: Light touch: WFL Pt reports some numbness in fingertips (premorbid but more pronounced since accident)   EDEMA: mild Rt hand, slightly worse in fingers Rt hand  MUSCLE TONE: pt has reported spasms and on muscle relaxer's (take at night)   COGNITION: Overall cognitive status: Within functional limits for tasks assessed  VISION: Subjective report: Pt reports no changes  Baseline vision: Bifocals Visual history: none    PERCEPTION: Not tested  PRAXIS: Not tested  OBSERVATIONS: bilateral hand deficits (Rt worse than Lt), bilateral shoulder deficits (Lt worse than Rt)  Premorbid rotator cuff tears but pt reports he had majority of ROM both shoulders prior to accident - now limited in ROM Lt shoulder                                                                                                                              TREATMENT DATE: 08/02/24   Discussed sleep positioning and which positions would be better (sleeping on back with support/pillow under Rt arm or on Lt side w/ pillow under Rt arm). Also discussed placing less weight through UES when ambulating if possible and stand more upright. Pt instructed to be aware of how his arms are positioned during the  Fabre as well to prevent increased tingling.   Reviewed finger tapping (isolated finger extension ex)   Pt encouraged to stretch Rt hand in full composite flexion (fist) and in MP flexion stretch with PIP's straight, wrist up. Pt also encouraged to stretch Lt index finger in MP flexion and PIP extension. Therapist demo and provided PROM in these positions   Pt also encouraged to continue with shoulder HEPs to prevent stiffness and frozen shoulder (pt with bilateral premorbid RTC tears)   Wrapped Rt hand in coban for full composite flexion (as able) and placed Rt hand in fluidotherapy x 10 min to address pain, stiffness, and hopefully improve sensation and less tingling.  (*Fluidotherapy went over session)   PATIENT EDUCATION: Education details: SEE ABOVE Person educated: Patient and Spouse Education method: Explanation, Verbal cues, and Handouts Education comprehension: verbalized understanding and returned demonstration  HOME EXERCISE PROGRAM: 07/04/24: yellow theraputty HEP (ACCESS CODE [id number]), coordination 07/28/24: Isolated finger movements (ACCESS CODE [id number])    GOALS: Goals reviewed with patient? Yes  SHORT TERM GOALS: Target date: 07/22/24  Pt independent with HEP for bilateral coordination and grip strength Baseline:  Goal status: MET   2.  Pt independent with modified shoulder HEP (premorbid rotator cuff tear)  Baseline:  Goal status: MET   3.  Pt to consistently be mod I for donning/doffing overhead shirts and UB bathing w/ AE prn Baseline:  07/21/24: Pt  reports donning/doffing UB short sleeve shirts independently/Mod I, needs Min A for donning long sleeves (adjustments at sleeves only). Pt also educated in and looking to obtain long handled sponge. Goal status: IN PROGRESS  4.  Pt to perform LE dressing w/ min assist using AE prn (w/ exception to TED hoses)  Baseline: mod to max assist 07/21/24: occasional assistance for pants at this time, doing his own shoes Goal status: MET  5.  Grip strength to increase by 10 lbs or more on Rt side and 5 lbs or more on Lt side Baseline: 5.2 lbs, 29.3 lbs 07/21/24: 13.0 lbs, 29.1 lbs Goal status: IN PROGRESS  6.  Improve BUE function as evidenced by increasing Box & Blocks by 5 or more blocks Baseline: Rt = 31, Lt = 35 07/21/24: R: 36, L: 39 Goal status: IN PROGRESS (met on Rt, almost met on Lt)     LONG TERM GOALS: Target date: 08/22/24  Independent with updated HEP  Baseline:  Goal status: IN PROGRESS  2.  Pt to be mod I for all BADLS including brushing hair and LB dressing (exception TED hoses)  Baseline:  07/21/24: Pt reports improved grooming and is obtaining long handled comb, pt reports occasional assistance with pants especially when already wearing compression stockings Goal status: IN PROGRESS  3.  Improve Rt hand coordination as evidenced by reducing speed on 9 hole peg test in under 2 minutes Baseline:  07/21/24: 1 minutes, 24 seconds Goal status: GOAL MET  4.  Improve Lt hand coordination as evidenced by reducing speed on 9 hole peg test in under 2 minutes Baseline:  07/21/24: 1 minute, 29 seconds Goal status: GOAL MET  5.  Pt to perform light IADLS in standing with countertop support w/o LOB  Baseline:  Goal status: INITIAL  6.  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 Pt reports improvements in hands but increased tingling and neuropathy in Rt hand today. Pt benefited from review of positioning during pm and during the Bagg to minimize symptoms. Pt would benefit from continued skilled OT services to improve ability to complete ADL/IADL and reduce caregiver burden.  PERFORMANCE DEFICITS: in functional skills including ADLs, IADLs, coordination, dexterity, sensation, edema, ROM, strength, pain, flexibility, Fine motor control, Gross motor control, mobility, body mechanics, cardiopulmonary status limiting function, decreased knowledge of precautions, decreased knowledge of use of DME, and UE functional use.   IMPAIRMENTS: are limiting patient from ADLs, IADLs, rest and sleep, work, leisure, and social participation.   CO-MORBIDITIES: has co-morbidities such as orthostatic hypotension, bilateral rotator cuff tears that affects occupational performance. Patient will benefit from skilled OT to address above impairments and improve overall function.  MODIFICATION OR ASSISTANCE TO COMPLETE EVALUATION: Min-Moderate modification of tasks or assist with assess necessary to complete an evaluation.  OT OCCUPATIONAL PROFILE AND HISTORY: Detailed assessment: Review of records and additional review of physical, cognitive, psychosocial history related to current functional performance.  CLINICAL DECISION MAKING: Moderate - several treatment options, min-mod task modification necessary  REHAB POTENTIAL: Good  EVALUATION COMPLEXITY: Moderate    PLAN:  OT FREQUENCY: 2x/week  OT DURATION: 8 weeks  PLANNED INTERVENTIONS: 97535 self care/ADL training, 02889 therapeutic exercise, 97530 therapeutic activity, 97112 neuromuscular re-education, 97140 manual therapy, 97113 aquatic therapy, 97018 paraffin, 02960 fluidotherapy, 97010 moist heat, 97760  Orthotic Initial, 97763 Orthotic/Prosthetic subsequent, passive range of motion, functional mobility training, patient/family education, and DME and/or AE instructions  RECOMMENDED OTHER SERVICES: none at this time  CONSULTED AND AGREED WITH PLAN OF CARE: Patient and family member/caregiver  PLAN FOR NEXT SESSION:  Continue fluidotherapy if beneficial  Coordination and grip strength activities Consider Lt hand based splint w/ index finger in MP flexion and PIP straight w/ strapping across PIP joint ADLS prn   Jerome Vaughn, OT 08/02/2024, 2:45 PM

## 2024-08-04 ENCOUNTER — Ambulatory Visit

## 2024-08-04 ENCOUNTER — Ambulatory Visit: Admitting: Physical Therapy

## 2024-08-04 VITALS — BP 101/67 | HR 95

## 2024-08-04 DIAGNOSIS — R278 Other lack of coordination: Secondary | ICD-10-CM | POA: Diagnosis not present

## 2024-08-04 DIAGNOSIS — M6281 Muscle weakness (generalized): Secondary | ICD-10-CM | POA: Diagnosis not present

## 2024-08-04 DIAGNOSIS — R29898 Other symptoms and signs involving the musculoskeletal system: Secondary | ICD-10-CM

## 2024-08-04 DIAGNOSIS — R29818 Other symptoms and signs involving the nervous system: Secondary | ICD-10-CM

## 2024-08-04 DIAGNOSIS — R293 Abnormal posture: Secondary | ICD-10-CM | POA: Diagnosis not present

## 2024-08-04 DIAGNOSIS — G8929 Other chronic pain: Secondary | ICD-10-CM

## 2024-08-04 DIAGNOSIS — M25512 Pain in left shoulder: Secondary | ICD-10-CM | POA: Diagnosis not present

## 2024-08-04 DIAGNOSIS — M62838 Other muscle spasm: Secondary | ICD-10-CM

## 2024-08-04 DIAGNOSIS — R2689 Other abnormalities of gait and mobility: Secondary | ICD-10-CM

## 2024-08-04 DIAGNOSIS — M25511 Pain in right shoulder: Secondary | ICD-10-CM | POA: Diagnosis not present

## 2024-08-04 DIAGNOSIS — G8252 Quadriplegia, C1-C4 incomplete: Secondary | ICD-10-CM | POA: Diagnosis not present

## 2024-08-04 NOTE — Therapy (Signed)
 OUTPATIENT OCCUPATIONAL THERAPY NEURO TREATMENT  Patient Name: Jerome Vaughn MRN: [id number] DOB:[birth year], 73 y.o., male Today's Date: 08/04/2024  PCP: Jerome Vaughn LABOR, MD REFERRING PROVIDER: [name] PARAS, PA-C  END OF SESSION:  OT End of Session - 08/04/24 1532     Visit Number 9    Number of Visits 16    Date for Recertification  08/22/24    Authorization Type [facility] - form submitted    Authorization Time Period 06/22/24-08/29/24    Authorization - Visit Number 9    Authorization - Number of Visits 16    Progress Note Due on Visit 10    OT Start Time 1530    OT Stop Time 1615    OT Time Calculation (min) 45 min    Equipment Utilized During Treatment Uzzle, red putty, fluidotherapy    Activity Tolerance Patient tolerated treatment well    Behavior During Therapy WFL for tasks assessed/performed             Past Medical History:  Diagnosis Date   Carpal tunnel syndrome    both hands   Colon polyps    Coronary artery disease    sees Dr. [name]   Diabetes mellitus    sees Dr. Stefano Vaughn   Diabetes mellitus [facility]) 2009   Ganglion cyst of wrist, right 1989   GERD (gastroesophageal reflux disease)    History of echocardiogram    a. Echo 4/17: Moderate LVH, EF 50-55%, mild LAE, no pericardial effusion   Hyperlipidemia    Hypertension    MGUS (monoclonal gammopathy of unknown significance)    sees Dr. Norleen Vaughn   Neuropathy    gets foot exams with Dr. Thresa Vaughn (podiatry)   Pneumonia    Rotator cuff injury    Past Surgical History:  Procedure Laterality Date   ANTERIOR CERVICAL DECOMP/DISCECTOMY FUSION N/A 04/19/2024   Procedure: ANTERIOR CERVICAL DECOMPRESSION/DISCECTOMY FUSION CERVICAL SIX-SEVEN;  Surgeon: Jerome Dorn MATSU, MD;  Location: [facility] OR;  Service: Neurosurgery;  Laterality: N/A;  ACDF C67   CARDIAC CATHETERIZATION N/A 11/21/2015   Procedure: Left Heart Cath and Coronary Angiography;  Surgeon: Jerome Vaughn Sharps, MD;  Location: [facility]  INVASIVE CV LAB;  Service: Cardiovascular;  Laterality: N/A;   COLONOSCOPY  03/18/2024   per Dr. [name], adenomatous polyp, repeat in 5 yrs   CORONARY ARTERY BYPASS GRAFT N/A 11/22/2015   Procedure: CORONARY ARTERY BYPASS GRAFTING (CABG) times five using the left internal mammary, right greater saphenous vein EVH, and left thigh greater saphenous vein EVH;  Surgeon: Jerome Fleeta Ochoa, MD;  Location[facility] OR;  Service: Open Heart Surgery;  Laterality: N/A;   CORONARY ARTERY BYPASS GRAFT  2017   CORONARY PRESSURE/FFR STUDY N/A 12/14/2018   Procedure: INTRAVASCULAR PRESSURE WIRE/FFR STUDY;  Surgeon: Jerome Candyce RAMAN, MD;  Location: [facility] INVASIVE CV LAB;  Service: Cardiovascular;  Laterality: N/A;   ESOPHAGOGASTRODUODENOSCOPY  03/18/2024   per Dr. [name], normal   frozen shoulder release Right    GANGLION CYST EXCISION Right 1989   LEFT HEART CATH AND CORS/GRAFTS ANGIOGRAPHY N/A 10/27/2017   Procedure: LEFT HEART CATH AND CORS/GRAFTS ANGIOGRAPHY;  Surgeon: Vaughn Jerome LELON, MD;  Location: [name];  Service: Cardiovascular;  Laterality: N/A;   LEFT HEART CATH AND CORS/GRAFTS ANGIOGRAPHY N/A 12/14/2018   Procedure: LEFT HEART CATH AND CORS/GRAFTS ANGIOGRAPHY;  Surgeon: Jerome Candyce RAMAN, MD;  Location: [facility] INVASIVE CV LAB;  Service: Cardiovascular;  Laterality: N/A;   TEE WITHOUT CARDIOVERSION N/A 11/22/2015  Procedure: TRANSESOPHAGEAL ECHOCARDIOGRAM (TEE);  Surgeon: Jerome Fleeta Ochoa, MD;  Location: [facility] OR;  Service: Open Heart Surgery;  Laterality: N/A;   WRIST GANGLION EXCISION Right    Patient Active Problem List   Diagnosis Date Noted   Fall 05/27/2024   Orthostatic hypotension 05/27/2024   Post-traumatic spasticity 05/27/2024   Coping style affecting medical condition 05/04/2024   Central cord syndrome (HCC) 04/21/2024   Acute incomplete quadriplegia (HCC) 04/21/2024   Shock (HCC) 04/15/2024   Varicose veins of both lower extremities 07/03/2023   Full thickness rotator  cuff tear 02/26/2023   Type 2 diabetes mellitus with diabetic polyneuropathy, without long-term current use of insulin  (HCC) 02/02/2023   Pancytopenia (HCC) 01/09/2023   OA (osteoarthritis) of knee 04/18/2022   Left shoulder pain 04/18/2022   GERD (gastroesophageal reflux disease)    Bacterial infection due to H. pylori 03/01/2020   Monoclonal gammopathy of unknown significance (MGUS) 03/01/2020   Iron  deficiency anemia 02/22/2020   Onychomycosis 02/08/2020   Rectal bleeding 01/24/2020   Indigestion 01/24/2020   Angina pectoris 12/12/2018   Erectile disorder due to medical condition in male 04/08/2018   Chronic combined systolic (congestive) and diastolic (congestive) heart failure (HCC) 07/29/2017   Pericardial effusion 12/24/2015   S/P CABG x 5 11/22/2015   Abnormal stress test 11/21/2015   Coronary artery disease of bypass graft of native heart with stable angina pectoris    Nonspecific abnormal electrocardiogram (ECG) (EKG) 11/07/2015   Essential hypertension 03/30/2011   Hypercholesterolemia 03/30/2011   Colon polyp 03/30/2011    ONSET DATE: 05/17/2024 (referral date)    REFERRING DIAG: G82.50 (ICD-10-CM) - Quadriplegia, unspecified S14.129A (ICD-10-CM) - Central cord syndrome, initial encounter (HCC)    THERAPY DIAG:  Other lack of coordination  Muscle weakness (generalized)  Other symptoms and signs involving the musculoskeletal system  Chronic pain of both shoulders  Rationale for Evaluation and Treatment: Rehabilitation  SUBJECTIVE:   SUBJECTIVE STATEMENT: Pt this date had PT prior to OT, had low BP, stated I didn't sleep great last night. Completed activities seated in OT tx. Pt accompanied by: Wife (Jerome Vaughn)   PERTINENT HISTORY: Presented 04/15/2024 after being on a roof cutting a tree branch when the tree fell on him pinning him against the roof. Pt underwent ACDF C6-7 on 04/19/24. No loss of consciousness. PMH:  significant for diabetes mellitus, CAD with  CABG 2017, hypertension, hyperlipidemia  IMPRESSION from MRI on 04/16/24: 1. No acute fracture or ligamentous injury of the cervical spine. 2. Focal hyperintense T2-weighted signal within the spinal cord at the C3-4 levels and at C7, which may indicate myelomalacia or spinal cord edema. 3. Severe spinal canal stenosis and bilateral neural foraminal stenosis at C6-7. 4. Mild spinal canal stenosis and severe bilateral neural foraminal stenosis at C3-4. 5. Moderate left C2-3, left C4-5 and bilateral C5-6 neural foraminal stenosis.  PRECAUTIONS: Cervical, Fall, and Other: soft collar, orthostatic hypotension (wears abdominal binder and TED hoses), lifting restrictions   WEIGHT BEARING RESTRICTIONS: ? UE wt bearing  PAIN:  Are you having pain? Yes: NPRS scale: 5/10 Pain location: B shoulders  Pain description: stiffness  Aggravating factors: standing Relieving factors: changing positions, OTC meds and muscle relaxers  FALLS: Has patient fallen in last 6 months? Yes. 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  HAND DOMINANCE: Right  ADLs: Transfers/ambulation related to ADLs: rollator Eating: mod I using Rt hand mostly with adapted utensils, wife cuts food Grooming: mod I for brushing teeth/shaving using both hands, except wife brushes pt's hair UB Dressing: min assist - improving LB Dressing: max assist for TED hose, fluctuating assist for  pants/shoes Toileting: supervision and assist for wiping Bathing: assist w/ lower legs and back  Tub Shower transfers: min assist Equipment: Shower seat with back and Grab bars  IADLs:  Shopping: pt has gone with wife 2x since d/c from hospital Light housekeeping: dependent Meal Prep: dependent Community mobility: dependent Medication management: wife places in pillbox, wife assists Handwriting: 90% legible in print (pt reports signature was always bad)   MOBILITY STATUS: using rollator   UPPER EXTREMITY ROM:  RUE AROM at shoulder and elbow WFL's. Limited more distally with 75% wrist and 50% composite flexion  LUE shoulder movement limited in approx 45* scaption (lesser true flex), 75% ER, 90% IR, Elbows distally WFLs w/ difficulty w/ index PIP extension   UPPER EXTREMITY MMT:   not tested d/t precautions, current deficits and premorbid rotator cuff tears bilaterally    HAND FUNCTION: Grip strength: Right: 5.2 lbs; Left: 29.3 lbs  COORDINATION: 9 Hole Peg test: Right: placed 6 in 2 min;   Left: 117.83 sec Box and Blocks:  Right 31 blocks, Left 35 blocks  SENSATION: Light touch: WFL Pt reports some numbness in fingertips (premorbid but more pronounced since accident)   EDEMA: mild Rt hand, slightly worse in fingers Rt hand  MUSCLE TONE: pt has reported spasms and on muscle relaxer's (take at night)   COGNITION: Overall cognitive status: Within functional limits for tasks assessed  VISION: Subjective report: Pt reports no changes  Baseline vision: Bifocals Visual history: none    PERCEPTION: Not tested  PRAXIS: Not tested  OBSERVATIONS: bilateral hand deficits (Rt worse than Lt), bilateral shoulder deficits (Lt worse than Rt)  Premorbid rotator cuff tears but pt reports he had majority of ROM both shoulders prior to accident - now limited in ROM Lt shoulder                                                                                                                              TREATMENT DATE: 08/04/24  Pt placed RUE in Fluidotherapy machine with supervised ROM x 9 min. Pt was educated to complete gross composite digit AROM and individual digit extension during modality time to improve ROM and decrease pain/stiffness of affected extremity by use of the machine's massaging action and thermal properties.    Reviewed with pt ADL participation, pt states he is still able to don UB clothing  independently as long as it has short sleeves, requires a little A with long sleeves for minor adjusments. Pt reports being able to groom independently, states that he does have the long handled comb but sometimes just uses the L hand. Also reports he can complete UB bathing but wife assists with washing the back.   Finally participated in coordination activity manipulating Uzzle pieces to recreate designated pattern.   PATIENT EDUCATION: Education details: SEE ABOVE Person educated: Patient and Spouse Education method: Explanation, Verbal cues, and Handouts Education comprehension: verbalized understanding and returned demonstration  HOME EXERCISE PROGRAM: 07/04/24: yellow theraputty HEP (ACCESS CODE [id number]), coordination 07/28/24: Isolated finger movements (ACCESS CODE [id number])    GOALS: Goals reviewed with patient? Yes  SHORT TERM GOALS: Target date: 07/22/24  Pt independent with HEP for bilateral coordination and grip strength Baseline:  Goal status: MET   2.  Pt independent with modified shoulder HEP (premorbid rotator cuff tear)  Baseline:  Goal status: MET   3.  Pt to consistently be mod I for donning/doffing overhead shirts and UB bathing w/ AE prn Baseline:  07/21/24: Pt reports donning/doffing UB short sleeve shirts independently/Mod I, needs Min A for donning long sleeves (adjustments at sleeves only). Pt also educated in and looking to obtain long handled sponge. Goal status: IN PROGRESS  4.  Pt to perform LE dressing w/ min assist using  AE prn (w/ exception to TED hoses)  Baseline: mod to max assist 07/21/24: occasional assistance for pants at this time, doing his own shoes Goal status: MET  5.  Grip strength to increase by 10 lbs or more on Rt side and 5 lbs or more on Lt side Baseline: 5.2 lbs, 29.3 lbs 07/21/24: 13.0 lbs, 29.1 lbs Goal status: IN PROGRESS  6.  Improve BUE function as evidenced by increasing Box & Blocks by 5 or more blocks Baseline: Rt = 31, Lt = 35 07/21/24: R: 36, L: 39 Goal status: IN PROGRESS (met on Rt, almost met on Lt)     LONG TERM GOALS: Target date: 08/22/24  Independent with updated HEP  Baseline:  Goal status: IN PROGRESS  2.  Pt to be mod I for all BADLS including brushing hair and LB dressing (exception TED hoses)  Baseline:  07/21/24: Pt reports improved grooming and is obtaining long handled comb, pt reports occasional assistance with pants especially when already wearing compression stockings Goal status: IN PROGRESS  3.  Improve Rt hand coordination as evidenced by reducing speed on 9 hole peg test in under 2 minutes Baseline:  07/21/24: 1 minutes, 24 seconds Goal status: GOAL MET  4.  Improve Lt hand coordination as evidenced by reducing speed on 9 hole peg test in under 2 minutes Baseline:  07/21/24: 1 minute, 29 seconds Goal status: GOAL MET  5.  Pt to perform light IADLS in standing with countertop support w/o LOB  Baseline:  Goal status: INITIAL  6.  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 Pt reports improved ability to complete gross composite flexion post fluidotherapy and reports minor improvements in ADLs. Pt would benefit from  continued skilled OT services to improve ability to complete ADL/IADL and reduce caregiver burden.  PERFORMANCE DEFICITS:  in functional skills including ADLs, IADLs, coordination, dexterity, sensation, edema, ROM, strength, pain, flexibility, Fine motor control, Gross motor control, mobility, body mechanics, cardiopulmonary status limiting function, decreased knowledge of precautions, decreased knowledge of use of DME, and UE functional use.   IMPAIRMENTS: are limiting patient from ADLs, IADLs, rest and sleep, work, leisure, and social participation.   CO-MORBIDITIES: has co-morbidities such as orthostatic hypotension, bilateral rotator cuff tears that affects occupational performance. Patient will benefit from skilled OT to address above impairments and improve overall function.  MODIFICATION OR ASSISTANCE TO COMPLETE EVALUATION: Min-Moderate modification of tasks or assist with assess necessary to complete an evaluation.  OT OCCUPATIONAL PROFILE AND HISTORY: Detailed assessment: Review of records and additional review of physical, cognitive, psychosocial history related to current functional performance.  CLINICAL DECISION MAKING: Moderate - several treatment options, min-mod task modification necessary  REHAB POTENTIAL: Good  EVALUATION COMPLEXITY: Moderate    PLAN:  OT FREQUENCY: 2x/week  OT DURATION: 8 weeks  PLANNED INTERVENTIONS: 97535 self care/ADL training, 02889 therapeutic exercise, 97530 therapeutic activity, 97112 neuromuscular re-education, 97140 manual therapy, 97113 aquatic therapy, 97018 paraffin, 02960 fluidotherapy, 97010 moist heat, 97760 Orthotic Initial, 97763 Orthotic/Prosthetic subsequent, passive range of motion, functional mobility training, patient/family education, and DME and/or AE instructions  RECOMMENDED OTHER SERVICES: none at this time  CONSULTED AND AGREED WITH PLAN OF CARE: Patient and family member/caregiver  PLAN FOR NEXT SESSION:  Continue  fluidotherapy Coordination and grip strength activities Consider Lt hand based splint w/ index finger in MP flexion and PIP straight w/ strapping across PIP joint ADLS prn   Rocky Dutch, OT 08/04/2024, 4:25 PM
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
## 2024-08-08 ENCOUNTER — Encounter: Payer: Self-pay | Admitting: Physical Medicine and Rehabilitation

## 2024-08-08 ENCOUNTER — Other Ambulatory Visit (HOSPITAL_COMMUNITY): Payer: Self-pay

## 2024-08-09 ENCOUNTER — Other Ambulatory Visit: Payer: Self-pay | Admitting: Internal Medicine

## 2024-08-09 ENCOUNTER — Encounter: Payer: Self-pay | Admitting: Physical Therapy

## 2024-08-09 ENCOUNTER — Ambulatory Visit: Admitting: Physical Therapy

## 2024-08-09 ENCOUNTER — Ambulatory Visit: Admitting: Occupational Therapy

## 2024-08-09 VITALS — BP 111/64 | HR 102

## 2024-08-09 DIAGNOSIS — R278 Other lack of coordination: Secondary | ICD-10-CM

## 2024-08-09 DIAGNOSIS — R29898 Other symptoms and signs involving the musculoskeletal system: Secondary | ICD-10-CM

## 2024-08-09 DIAGNOSIS — M6281 Muscle weakness (generalized): Secondary | ICD-10-CM

## 2024-08-09 DIAGNOSIS — M25512 Pain in left shoulder: Secondary | ICD-10-CM | POA: Diagnosis not present

## 2024-08-09 DIAGNOSIS — R293 Abnormal posture: Secondary | ICD-10-CM | POA: Diagnosis not present

## 2024-08-09 DIAGNOSIS — R29818 Other symptoms and signs involving the nervous system: Secondary | ICD-10-CM

## 2024-08-09 DIAGNOSIS — G8929 Other chronic pain: Secondary | ICD-10-CM

## 2024-08-09 DIAGNOSIS — G8252 Quadriplegia, C1-C4 incomplete: Secondary | ICD-10-CM | POA: Diagnosis not present

## 2024-08-09 DIAGNOSIS — M25511 Pain in right shoulder: Secondary | ICD-10-CM | POA: Diagnosis not present

## 2024-08-09 DIAGNOSIS — R2689 Other abnormalities of gait and mobility: Secondary | ICD-10-CM

## 2024-08-09 DIAGNOSIS — M62838 Other muscle spasm: Secondary | ICD-10-CM

## 2024-08-09 NOTE — Telephone Encounter (Signed)
 RX addended to reflect most recent notes

## 2024-08-09 NOTE — Therapy (Signed)
 OUTPATIENT OCCUPATIONAL THERAPY NEURO TREATMENT  Patient Name: ROCHELL MABIE MRN: [id number] DOB:[birth year], 73 y.o., male Today's Date: 08/09/2024  PCP: [name] LABOR, MD REFERRING PROVIDER: [name] PARAS, PA-C  END OF SESSION:  OT End of Session - 08/09/24 1449     Visit Number 10    Number of Visits 16    Date for Recertification  08/22/24    Authorization Type [facility] - form submitted    Authorization Time Period 06/22/24-08/29/24    Authorization - Visit Number 10    Authorization - Number of Visits 16    Progress Note Due on Visit 10    OT Start Time 1400    OT Stop Time 1445    OT Time Calculation (min) 45 min    Equipment Utilized During Treatment Uzzle, red putty, fluidotherapy    Activity Tolerance Patient tolerated treatment well    Behavior During Therapy WFL for tasks assessed/performed             Past Medical History:  Diagnosis Date   Carpal tunnel syndrome    both hands   Colon polyps    Coronary artery disease    sees Dr. [name]   Diabetes mellitus    sees Dr. [name]   Diabetes mellitus [facility]) 2009   Ganglion cyst of wrist, right 1989   GERD (gastroesophageal reflux disease)    History of echocardiogram    a. Echo 4/17: Moderate LVH, EF 50-55%, mild LAE, no pericardial effusion   Hyperlipidemia    Hypertension    MGUS (monoclonal gammopathy of unknown significance)    sees Dr. [name]   Neuropathy    gets foot exams with Dr. [name] (podiatry)   Pneumonia    Rotator cuff injury    Past Surgical History:  Procedure Laterality Date   ANTERIOR CERVICAL DECOMP/DISCECTOMY FUSION N/A 04/19/2024   Procedure: ANTERIOR CERVICAL DECOMPRESSION/DISCECTOMY FUSION CERVICAL SIX-SEVEN;  Surgeon: [name] MATSU, MD;  Location: [facility] OR;  Service: Neurosurgery;  Laterality: N/A;  ACDF C67   CARDIAC CATHETERIZATION N/A 11/21/2015   Procedure: Left Heart Cath and Coronary Angiography;  Surgeon: [name], MD;  Location: [facility]  INVASIVE CV LAB;  Service: Cardiovascular;  Laterality: N/A;   COLONOSCOPY  03/18/2024   per Dr. [name], adenomatous polyp, repeat in 5 yrs   CORONARY ARTERY BYPASS GRAFT N/A 11/22/2015   Procedure: CORONARY ARTERY BYPASS GRAFTING (CABG) times five using the left internal mammary, right greater saphenous vein EVH, and left thigh greater saphenous vein EVH;  Surgeon: [name], MD;  Location: [facility] OR;  Service: Open Heart Surgery;  Laterality: N/A;   CORONARY ARTERY BYPASS GRAFT  2017   CORONARY PRESSURE/FFR STUDY N/A 12/14/2018   Procedure: INTRAVASCULAR PRESSURE WIRE/FFR STUDY;  Surgeon: [name], MD;  Location: [facility] INVASIVE CV LAB;  Service: Cardiovascular;  Laterality: N/A;   ESOPHAGOGASTRODUODENOSCOPY  03/18/2024   per Dr. [name], normal   frozen shoulder release Right    GANGLION CYST EXCISION Right 1989   LEFT HEART CATH AND CORS/GRAFTS ANGIOGRAPHY N/A 10/27/2017   Procedure: LEFT HEART CATH AND CORS/GRAFTS ANGIOGRAPHY;  Surgeon: Sharps [name], MD;  Location: [name];  Service: Cardiovascular;  Laterality: N/A;   LEFT HEART CATH AND CORS/GRAFTS ANGIOGRAPHY N/A 12/14/2018   Procedure: LEFT HEART CATH AND CORS/GRAFTS ANGIOGRAPHY;  Surgeon: [name], MD;  Location: [facility] INVASIVE CV LAB;  Service: Cardiovascular;  Laterality: N/A;   TEE WITHOUT CARDIOVERSION N/A 11/22/2015  Procedure: TRANSESOPHAGEAL ECHOCARDIOGRAM (TEE);  Surgeon: [name], MD;  Location: [facility] OR;  Service: Open Heart Surgery;  Laterality: N/A;   WRIST GANGLION EXCISION Right    Patient Active Problem List   Diagnosis Date Noted   Fall 05/27/2024   Orthostatic hypotension 05/27/2024   Post-traumatic spasticity 05/27/2024   Coping style affecting medical condition 05/04/2024   Central cord syndrome (HCC) 04/21/2024   Acute incomplete quadriplegia (HCC) 04/21/2024   Shock (HCC) 04/15/2024   Varicose veins of both lower extremities 07/03/2023   Full thickness rotator  cuff tear 02/26/2023   Type 2 diabetes mellitus with diabetic polyneuropathy, without long-term current use of insulin  (HCC) 02/02/2023   Pancytopenia (HCC) 01/09/2023   OA (osteoarthritis) of knee 04/18/2022   Left shoulder pain 04/18/2022   GERD (gastroesophageal reflux disease)    Bacterial infection due to H. pylori 03/01/2020   Monoclonal gammopathy of unknown significance (MGUS) 03/01/2020   Iron  deficiency anemia 02/22/2020   Onychomycosis 02/08/2020   Rectal bleeding 01/24/2020   Indigestion 01/24/2020   Angina pectoris 12/12/2018   Erectile disorder due to medical condition in male 04/08/2018   Chronic combined systolic (congestive) and diastolic (congestive) heart failure (HCC) 07/29/2017   Pericardial effusion 12/24/2015   S/P CABG x 5 11/22/2015   Abnormal stress test 11/21/2015   Coronary artery disease of bypass graft of native heart with stable angina pectoris    Nonspecific abnormal electrocardiogram (ECG) (EKG) 11/07/2015   Essential hypertension 03/30/2011   Hypercholesterolemia 03/30/2011   Colon polyp 03/30/2011    ONSET DATE: 05/17/2024 (referral date)    REFERRING DIAG: G82.50 (ICD-10-CM) - Quadriplegia, unspecified S14.129A (ICD-10-CM) - Central cord syndrome, initial encounter (HCC)    THERAPY DIAG:  Other lack of coordination  Muscle weakness (generalized)  Other symptoms and signs involving the musculoskeletal system  Other symptoms and signs involving the nervous system  Rationale for Evaluation and Treatment: Rehabilitation  SUBJECTIVE:   SUBJECTIVE STATEMENT: Pt walked into therapy today with SPC. Pt reports doing well  Pt accompanied by: [name]   PERTINENT HISTORY: Presented 04/15/2024 after being on a roof cutting a tree branch when the tree fell on him pinning him against the roof. Pt underwent ACDF C6-7 on 04/19/24. No loss of consciousness. PMH:  significant for diabetes mellitus, CAD with CABG 2017, hypertension,  hyperlipidemia  IMPRESSION from MRI on 04/16/24: 1. No acute fracture or ligamentous injury of the cervical spine. 2. Focal hyperintense T2-weighted signal within the spinal cord at the C3-4 levels and at C7, which may indicate myelomalacia or spinal cord edema. 3. Severe spinal canal stenosis and bilateral neural foraminal stenosis at C6-7. 4. Mild spinal canal stenosis and severe bilateral neural foraminal stenosis at C3-4. 5. Moderate left C2-3, left C4-5 and bilateral C5-6 neural foraminal stenosis.  PRECAUTIONS: Cervical, Fall, and Other: soft collar, orthostatic hypotension (wears abdominal binder and TED hoses), lifting restrictions   WEIGHT BEARING RESTRICTIONS: ? UE wt bearing  PAIN:  Are you having pain? Yes: NPRS scale: 5/10 Pain location: B shoulders  Pain description: stiffness  Aggravating factors: standing Relieving factors: changing positions, OTC meds and muscle relaxers  FALLS: Has patient fallen in last 6 months? Yes. 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  HAND DOMINANCE: Right  ADLs: Transfers/ambulation related to ADLs: rollator Eating: mod I using Rt hand mostly with adapted utensils, wife cuts food Grooming: mod I for brushing teeth/shaving using both hands, except wife brushes pt's hair UB Dressing: min assist - improving LB Dressing: max assist for TED hose, fluctuating assist for pants/shoes Toileting: supervision and assist  for wiping Bathing: assist w/ lower legs and back  Tub Shower transfers: min assist Equipment: Shower seat with back and Grab bars  IADLs:  Shopping: pt has gone with wife 2x since d/c from hospital Light housekeeping: dependent Meal Prep: dependent Community mobility: dependent Medication management: wife places in pillbox, wife assists Handwriting: 90% legible in print (pt reports signature was always bad)   MOBILITY STATUS: using rollator   UPPER EXTREMITY ROM:  RUE AROM at shoulder and elbow WFL's. Limited more distally with 75% wrist and 50% composite flexion  LUE shoulder movement limited in approx 45* scaption (lesser true flex), 75% ER, 90% IR, Elbows distally WFLs w/ difficulty w/ index PIP extension   UPPER EXTREMITY MMT:   not tested d/t precautions, current deficits and premorbid rotator cuff tears bilaterally    HAND FUNCTION: Grip strength: Right: 5.2 lbs; Left: 29.3 lbs  COORDINATION: 9 Hole Peg test: Right: placed 6 in 2 min;   Left: 117.83 sec Box and Blocks:  Right 31 blocks, Left 35 blocks  SENSATION: Light touch: WFL Pt reports some numbness in fingertips (premorbid but more pronounced since accident)   EDEMA: mild Rt hand, slightly worse in fingers Rt hand  MUSCLE TONE: pt has reported spasms and on muscle relaxer's (take at night)   COGNITION: Overall cognitive status: Within functional limits for tasks assessed  VISION: Subjective report: Pt reports no changes  Baseline vision: Bifocals Visual history: none    PERCEPTION: Not tested  PRAXIS: Not tested  OBSERVATIONS: bilateral hand deficits (Rt worse than Lt), bilateral shoulder deficits (Lt worse than Rt)  Premorbid rotator cuff tears but pt reports he had majority of ROM both shoulders prior to accident - now limited in ROM Lt shoulder                                                                                                                             TREATMENT DATE:  08/09/24  Fabricated and fitted Rt resting hand splint (primarily for pm only) w/ wrist in extension, MP's in flexion, and PIP joints as straight as possible to prevent PIP tightness and flexor contractures. Issued splint and instructed in wear and care which includes gradually building up tolerance during the Wrench over next 2-3 days before switching to night time use. Pt verbalize understanding  Pt also placing medium tee sized pegs in pegboard Rt hand  with mod to max difficulty while therapist simultaneously working on splint.    PATIENT EDUCATION: Education details: SEE ABOVE Person educated: Patient and Spouse Education method: Explanation, Verbal cues, and Handouts Education comprehension: verbalized understanding and returned demonstration  HOME EXERCISE PROGRAM: 07/04/24: yellow theraputty HEP (ACCESS CODE [id number]), coordination 07/28/24: Isolated finger movements (ACCESS CODE [id number]) 08/09/24: splint wear and care    GOALS: Goals reviewed with patient? Yes  SHORT TERM GOALS: Target date: 07/22/24  Pt independent with HEP for bilateral coordination and grip strength Baseline:  Goal status: MET   2.  Pt independent with modified shoulder HEP (premorbid rotator cuff tear)  Baseline:  Goal status: MET   3.  Pt to consistently be mod I for donning/doffing overhead shirts and UB bathing w/ AE prn Baseline:  07/21/24: Pt reports donning/doffing UB short sleeve shirts independently/Mod I, needs Min A for donning long sleeves (adjustments at sleeves only). Pt also educated in and looking to obtain long handled sponge. Goal status: PARTIALLY MET  4.  Pt to perform LE dressing w/ min assist using AE prn (w/ exception to TED hoses)  Baseline: mod to max assist 07/21/24: occasional assistance for pants at this time, doing his own shoes Goal status: MET  5.  Grip strength to increase by 10 lbs or more on Rt side and 5 lbs or more on Lt side Baseline: 5.2 lbs, 29.3  lbs 07/21/24: 13.0 lbs, 29.1 lbs Goal status: IN PROGRESS  6.  Improve BUE function as evidenced by increasing Box & Blocks by 5 or more blocks Baseline: Rt = 31, Lt = 35 07/21/24: R: 36, L: 39 Goal status: PARTIALLY MET (met on Rt, almost met on Lt)     LONG TERM GOALS: Target date: 08/22/24  Independent with updated HEP  Baseline:  Goal status: IN PROGRESS  2.  Pt to be mod I for all BADLS including brushing hair and LB dressing (exception TED hoses)  Baseline:  07/21/24: Pt reports improved grooming and is obtaining long handled comb, pt reports occasional assistance with pants especially when already wearing compression stockings Goal status: IN PROGRESS  3.  Improve Rt hand coordination as evidenced by reducing speed on 9 hole peg test in under 2 minutes Baseline:  07/21/24: 1 minutes, 24 seconds Goal status:  MET  4.  Improve Lt hand coordination as evidenced by reducing speed on 9 hole peg test in under 2 minutes Baseline:  07/21/24: 1 minute, 29 seconds Goal status:  MET  5.  Pt to perform light IADLS in standing with countertop support w/o LOB  Baseline:  Goal status: IN PROGRESS   6.  Pt to improve LUE shoulder ROM to 70* flexion or greater for mid level reaching Baseline:  07/21/24: 80 degrees measured however some scaption noted Goal status: IN PROGRESS  ASSESSMENT:  CLINICAL IMPRESSION: This 10th progress note is for dates 06/22/24 - 08/09/24: Pt has met 5/6 STG's and already met 2 LTG's at this time. Pt has improved in coordination and functional use. Patient s/p incomplete quadriplegia (central cord syndrome) from roofing accident on 04/15/24,  s/p ACDF on 04/19/24. Pt reports improved ability to complete gross composite flexion post fluidotherapy and reports minor improvements in ADLs. Pt would benefit from continued skilled OT services to improve ability to complete ADL/IADL and reduce caregiver burden.  PERFORMANCE DEFICITS: in functional skills including  ADLs, IADLs, coordination, dexterity, sensation, edema, ROM, strength, pain, flexibility, Fine motor control, Gross motor control, mobility, body mechanics, cardiopulmonary status  limiting function, decreased knowledge of precautions, decreased knowledge of use of DME, and UE functional use.   IMPAIRMENTS: are limiting patient from ADLs, IADLs, rest and sleep, work, leisure, and social participation.   CO-MORBIDITIES: has co-morbidities such as orthostatic hypotension, bilateral rotator cuff tears that affects occupational performance. Patient will benefit from skilled OT to address above impairments and improve overall function.  MODIFICATION OR ASSISTANCE TO COMPLETE EVALUATION: Min-Moderate modification of tasks or assist with assess necessary to complete an evaluation.  OT OCCUPATIONAL PROFILE AND HISTORY: Detailed assessment: Review of records and additional review of physical, cognitive, psychosocial history related to current functional performance.  CLINICAL DECISION MAKING: Moderate - several treatment options, min-mod task modification necessary  REHAB POTENTIAL: Good  EVALUATION COMPLEXITY: Moderate    PLAN:  OT FREQUENCY: 2x/week  OT DURATION: 8 weeks  PLANNED INTERVENTIONS: 97535 self care/ADL training, 02889 therapeutic exercise, 97530 therapeutic activity, 97112 neuromuscular re-education, 97140 manual therapy, 97113 aquatic therapy, 97018 paraffin, 02960 fluidotherapy, 97010 moist heat, 97760 Orthotic Initial, 97763 Orthotic/Prosthetic subsequent, passive range of motion, functional mobility training, patient/family education, and DME and/or AE instructions  RECOMMENDED OTHER SERVICES: none at this time  CONSULTED AND AGREED WITH PLAN OF CARE: Patient and family member/caregiver  PLAN FOR NEXT SESSION:  Check resting hand splint Rt hand Continue fluidotherapy Coordination and grip strength activities Consider Lt hand based splint w/ index finger in MP flexion and PIP  straight w/ strapping across PIP joint ADLS prn   Burnard JINNY Roads, OT 08/09/2024, 2:50 PM
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
## 2024-08-09 NOTE — Patient Instructions (Signed)
-   Walking Step Over  - 1 x daily - 5 x weekly - 3 sets - 10 reps - Standing Tandem Balance with Counter Support  - 1 x daily - 5 x weekly - 1 sets - 2-3 reps - 45-60 seconds hold - Tandem Walking with Counter Support  - 1 x daily - 5 x weekly - 3 sets - 10 reps - Romberg Stance Eyes Closed on Foam Pad  - 1 x daily - 5 x weekly - 3 sets - 10 reps

## 2024-08-10 ENCOUNTER — Encounter: Payer: Self-pay | Admitting: Family Medicine

## 2024-08-10 NOTE — Telephone Encounter (Signed)
 Have him make an in person OV so we can check him

## 2024-08-11 ENCOUNTER — Encounter: Payer: Self-pay | Admitting: Physical Therapy

## 2024-08-11 ENCOUNTER — Ambulatory Visit: Admitting: Physical Therapy

## 2024-08-11 ENCOUNTER — Ambulatory Visit

## 2024-08-11 VITALS — BP 116/71 | HR 96

## 2024-08-11 DIAGNOSIS — G8929 Other chronic pain: Secondary | ICD-10-CM

## 2024-08-11 DIAGNOSIS — R29818 Other symptoms and signs involving the nervous system: Secondary | ICD-10-CM | POA: Diagnosis not present

## 2024-08-11 DIAGNOSIS — M6281 Muscle weakness (generalized): Secondary | ICD-10-CM

## 2024-08-11 DIAGNOSIS — M62838 Other muscle spasm: Secondary | ICD-10-CM

## 2024-08-11 DIAGNOSIS — R29898 Other symptoms and signs involving the musculoskeletal system: Secondary | ICD-10-CM | POA: Diagnosis not present

## 2024-08-11 DIAGNOSIS — M25512 Pain in left shoulder: Secondary | ICD-10-CM | POA: Diagnosis not present

## 2024-08-11 DIAGNOSIS — R278 Other lack of coordination: Secondary | ICD-10-CM

## 2024-08-11 DIAGNOSIS — M25511 Pain in right shoulder: Secondary | ICD-10-CM | POA: Diagnosis not present

## 2024-08-11 DIAGNOSIS — R293 Abnormal posture: Secondary | ICD-10-CM | POA: Diagnosis not present

## 2024-08-11 DIAGNOSIS — G8252 Quadriplegia, C1-C4 incomplete: Secondary | ICD-10-CM | POA: Diagnosis not present

## 2024-08-11 DIAGNOSIS — R208 Other disturbances of skin sensation: Secondary | ICD-10-CM

## 2024-08-11 DIAGNOSIS — R2689 Other abnormalities of gait and mobility: Secondary | ICD-10-CM

## 2024-08-11 NOTE — Therapy (Signed)
 OUTPATIENT OCCUPATIONAL THERAPY NEURO TREATMENT  Patient Name: ADONI GREENOUGH MRN: [id number] DOB:1951/01/15, 73 y.o., male Today's Date: 08/11/2024  PCP: [name] LABOR, MD REFERRING PROVIDER: [name] PARAS, PA-C  END OF SESSION:  OT End of Session - 08/11/24 1457     Visit Number 11    Number of Visits 16    Date for Recertification  08/22/24    Authorization Type [facility] - form submitted    Authorization Time Period 06/22/24-08/29/24    Authorization - Visit Number 11    Authorization - Number of Visits 16    Progress Note Due on Visit 10    OT Start Time 1401    OT Stop Time 1445    OT Time Calculation (min) 44 min    Equipment Utilized During Treatment quarters, pennies, fluidotherapy, cards    Activity Tolerance Patient tolerated treatment well    Behavior During Therapy WFL for tasks assessed/performed              Past Medical History:  Diagnosis Date   Carpal tunnel syndrome    both hands   Colon polyps    Coronary artery disease    sees Dr. [name]   Diabetes mellitus    sees Dr. [name]   Diabetes mellitus [facility] 2009   Ganglion cyst of wrist, right 1989   GERD (gastroesophageal reflux disease)    History of echocardiogram    a. Echo 4/17: Moderate LVH, EF 50-55%, mild LAE, no pericardial effusion   Hyperlipidemia    Hypertension    MGUS (monoclonal gammopathy of unknown significance)    sees Dr. [name]   Neuropathy    gets foot exams with Dr. [name] (podiatry)   Pneumonia    Rotator cuff injury    Past Surgical History:  Procedure Laterality Date   ANTERIOR CERVICAL DECOMP/DISCECTOMY FUSION N/A 04/19/2024   Procedure: ANTERIOR CERVICAL DECOMPRESSION/DISCECTOMY FUSION CERVICAL SIX-SEVEN;  Surgeon: [name] MATSU, MD;  Location: [facility] OR;  Service: Neurosurgery;  Laterality: N/A;  ACDF C67   CARDIAC CATHETERIZATION N/A 11/21/2015   Procedure: Left Heart Cath and Coronary Angiography;  Surgeon: [name], MD;   Location: [facility] INVASIVE CV LAB;  Service: Cardiovascular;  Laterality: N/A;   COLONOSCOPY  03/18/2024   per Dr. [name], adenomatous polyp, repeat in 5 yrs   CORONARY ARTERY BYPASS GRAFT N/A 11/22/2015   Procedure: CORONARY ARTERY BYPASS GRAFTING (CABG) times five using the left internal mammary, right greater saphenous vein EVH, and left thigh greater saphenous vein EVH;  Surgeon: [name], MD;  Location: [facility] OR;  Service: Open Heart Surgery;  Laterality: N/A;   CORONARY ARTERY BYPASS GRAFT  2017   CORONARY PRESSURE/FFR STUDY N/A 12/14/2018   Procedure: INTRAVASCULAR PRESSURE WIRE/FFR STUDY;  Surgeon: [name], MD;  Location: [facility] INVASIVE CV LAB;  Service: Cardiovascular;  Laterality: N/A;   ESOPHAGOGASTRODUODENOSCOPY  03/18/2024   per Dr. [name], normal   frozen shoulder release Right    GANGLION CYST EXCISION Right 1989   LEFT HEART CATH AND CORS/GRAFTS ANGIOGRAPHY N/A 10/27/2017   Procedure: LEFT HEART CATH AND CORS/GRAFTS ANGIOGRAPHY;  Surgeon: Sharps [name], MD;  Location: [name];  Service: Cardiovascular;  Laterality: N/A;   LEFT HEART CATH AND CORS/GRAFTS ANGIOGRAPHY N/A 12/14/2018   Procedure: LEFT HEART CATH AND CORS/GRAFTS ANGIOGRAPHY;  Surgeon: [name], MD;  Location: [facility] INVASIVE CV LAB;  Service: Cardiovascular;  Laterality: N/A;   TEE WITHOUT CARDIOVERSION N/A 11/22/2015  Procedure: TRANSESOPHAGEAL ECHOCARDIOGRAM (TEE);  Surgeon: [name], MD;  Location: [facility] OR;  Service: Open Heart Surgery;  Laterality: N/A;   WRIST GANGLION EXCISION Right    Patient Active Problem List   Diagnosis Date Noted   Fall 05/27/2024   Orthostatic hypotension 05/27/2024   Post-traumatic spasticity 05/27/2024   Coping style affecting medical condition 05/04/2024   Central cord syndrome (HCC) 04/21/2024   Acute incomplete quadriplegia (HCC) 04/21/2024   Shock (HCC) 04/15/2024   Varicose veins of both lower extremities 07/03/2023   Full  thickness rotator cuff tear 02/26/2023   Type 2 diabetes mellitus with diabetic polyneuropathy, without long-term current use of insulin  (HCC) 02/02/2023   Pancytopenia (HCC) 01/09/2023   OA (osteoarthritis) of knee 04/18/2022   Left shoulder pain 04/18/2022   GERD (gastroesophageal reflux disease)    Bacterial infection due to H. pylori 03/01/2020   Monoclonal gammopathy of unknown significance (MGUS) 03/01/2020   Iron  deficiency anemia 02/22/2020   Onychomycosis 02/08/2020   Rectal bleeding 01/24/2020   Indigestion 01/24/2020   Angina pectoris 12/12/2018   Erectile disorder due to medical condition in male 04/08/2018   Chronic combined systolic (congestive) and diastolic (congestive) heart failure (HCC) 07/29/2017   Pericardial effusion 12/24/2015   S/P CABG x 5 11/22/2015   Abnormal stress test 11/21/2015   Coronary artery disease of bypass graft of native heart with stable angina pectoris    Nonspecific abnormal electrocardiogram (ECG) (EKG) 11/07/2015   Essential hypertension 03/30/2011   Hypercholesterolemia 03/30/2011   Colon polyp 03/30/2011    ONSET DATE: 05/17/2024 (referral date)    REFERRING DIAG: G82.50 (ICD-10-CM) - Quadriplegia, unspecified S14.129A (ICD-10-CM) - Central cord syndrome, initial encounter (HCC)    THERAPY DIAG:  Other lack of coordination  Muscle weakness (generalized)  Rationale for Evaluation and Treatment: Rehabilitation  SUBJECTIVE:   SUBJECTIVE STATEMENT: Pt walked into therapy today with SPC. Pt reports having using Pt accompanied by: [name]   PERTINENT HISTORY: Presented 04/15/2024 after being on a roof cutting a tree branch when the tree fell on him pinning him against the roof. Pt underwent ACDF C6-7 on 04/19/24. No loss of consciousness. PMH:  significant for diabetes mellitus, CAD with CABG 2017, hypertension, hyperlipidemia  IMPRESSION from MRI on 04/16/24: 1. No acute fracture or ligamentous injury of the cervical  spine. 2. Focal hyperintense T2-weighted signal within the spinal cord at the C3-4 levels and at C7, which may indicate myelomalacia or spinal cord edema. 3. Severe spinal canal stenosis and bilateral neural foraminal stenosis at C6-7. 4. Mild spinal canal stenosis and severe bilateral neural foraminal stenosis at C3-4. 5. Moderate left C2-3, left C4-5 and bilateral C5-6 neural foraminal stenosis.  PRECAUTIONS: Cervical, Fall, and Other: soft collar, orthostatic hypotension (wears abdominal binder and TED hoses), lifting restrictions   WEIGHT BEARING RESTRICTIONS: ? UE wt bearing  PAIN:  Are you having pain? Yes: NPRS scale: 5/10 Pain location: B shoulders  Pain description: stiffness  Aggravating factors: standing Relieving factors: changing positions, OTC meds and muscle relaxers  FALLS: Has patient fallen in last 6 months? Yes. 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  HAND DOMINANCE: Right  ADLs: Transfers/ambulation related to ADLs: rollator Eating: mod I using Rt hand mostly with adapted utensils, wife cuts food Grooming: mod I for brushing teeth/shaving using both hands, except wife brushes pt's hair UB Dressing: min assist - improving LB Dressing: max assist for TED hose, fluctuating assist for pants/shoes Toileting: supervision and assist for wiping Bathing: assist w/ lower legs and back  Tub Shower transfers: min assist Equipment: Shower seat  with back and Grab bars  IADLs:  Shopping: pt has gone with wife 2x since d/c from hospital Light housekeeping: dependent Meal Prep: dependent Community mobility: dependent Medication management: wife places in pillbox, wife assists Handwriting: 90% legible in print (pt reports signature was always bad)   MOBILITY STATUS: using rollator   UPPER EXTREMITY ROM:  RUE AROM at shoulder and elbow WFL's. Limited more distally with 75% wrist and 50% composite flexion  LUE shoulder movement limited in approx 45* scaption (lesser true flex), 75% ER, 90% IR, Elbows distally WFLs w/ difficulty w/ index PIP extension   UPPER EXTREMITY MMT:   not tested d/t precautions, current deficits and premorbid rotator cuff tears bilaterally    HAND FUNCTION: Grip strength: Right: 5.2 lbs; Left: 29.3 lbs  COORDINATION: 9 Hole Peg test: Right: placed 6 in 2 min;   Left: 117.83 sec Box and Blocks:  Right 31 blocks, Left 35 blocks  SENSATION: Light touch: WFL Pt reports some numbness in fingertips (premorbid but more pronounced since accident)   EDEMA: mild Rt hand, slightly worse in fingers Rt hand  MUSCLE TONE: pt has reported spasms and on muscle relaxer's (take at night)   COGNITION: Overall cognitive status: Within functional limits for tasks assessed  VISION: Subjective report: Pt reports no changes  Baseline vision: Bifocals Visual history: none    PERCEPTION: Not tested  PRAXIS: Not tested  OBSERVATIONS: bilateral hand deficits (Rt worse than Lt), bilateral shoulder deficits (Lt worse than Rt)  Premorbid rotator cuff tears but pt reports he had majority of ROM both shoulders prior to accident - now limited in ROM Lt shoulder                                                                                                                             TREATMENT DATE: 08/11/24  Pt placed BUE in Fluidotherapy machine with supervised ROM x 8 min. Pt was educated to complete tendon glides  and gross composite fist during modality time to improve ROM and decrease pain/stiffness of affected extremity by use of the machine's massaging action and thermal properties.    Pt engaged in FM coordination activities, participating in card game and manipulating golf tees with R hand, Finally attempted coin stacking activities to hone FM coordination to carry over with ADL completion. Required grading down by using quarters instead of pennies for more surface area  and better ability to grasp. Limited gross composite fist affecting ability to hold pennies in hand.  PATIENT EDUCATION: Education details: SEE ABOVE Person educated: Patient and Spouse Education method: Explanation, Verbal cues, and Handouts Education comprehension: verbalized understanding and returned demonstration  HOME EXERCISE PROGRAM: 07/04/24: yellow theraputty HEP (ACCESS CODE [id number]), coordination 07/28/24: Isolated finger movements (ACCESS CODE [id number]) 08/09/24: splint wear and care    GOALS: Goals reviewed with patient? Yes  SHORT TERM GOALS: Target date: 07/22/24  Pt independent with HEP for bilateral coordination and grip strength Baseline:  Goal status: MET   2.  Pt independent with modified shoulder HEP (premorbid rotator cuff tear)  Baseline:  Goal status: MET   3.  Pt to consistently be mod I for donning/doffing overhead shirts and UB bathing w/ AE prn Baseline:  07/21/24: Pt reports donning/doffing UB short sleeve shirts independently/Mod I, needs Min A for donning long sleeves (adjustments at sleeves only). Pt also educated in and looking to obtain long handled sponge. Goal status: PARTIALLY MET  4.  Pt to perform LE dressing w/ min assist using AE prn (w/ exception to TED hoses)  Baseline: mod to max assist 07/21/24: occasional assistance for pants at this time, doing his own shoes Goal status: MET  5.  Grip strength to increase by 10 lbs or more on Rt side and 5 lbs or more on Lt  side Baseline: 5.2 lbs, 29.3 lbs 07/21/24: 13.0 lbs, 29.1 lbs Goal status: IN PROGRESS  6.  Improve BUE function as evidenced by increasing Box & Blocks by 5 or more blocks Baseline: Rt = 31, Lt = 35 07/21/24: R: 36, L: 39 Goal status: PARTIALLY MET (met on Rt, almost met on Lt)     LONG TERM GOALS: Target date: 08/22/24  Independent with updated HEP  Baseline:  Goal status: IN PROGRESS  2.  Pt to be mod I for all BADLS including brushing hair and LB dressing (exception TED hoses)  Baseline:  07/21/24: Pt reports improved grooming and is obtaining long handled comb, pt reports occasional assistance with pants especially when already wearing compression stockings Goal status: IN PROGRESS  3.  Improve Rt hand coordination as evidenced by reducing speed on 9 hole peg test in under 2 minutes Baseline:  07/21/24: 1 minutes, 24 seconds Goal status:  MET  4.  Improve Lt hand coordination as evidenced by reducing speed on 9 hole peg test in under 2 minutes Baseline:  07/21/24: 1 minute, 29 seconds Goal status:  MET  5.  Pt to perform light IADLS in standing with countertop support w/o LOB  Baseline:  Goal status: IN PROGRESS   6.  Pt to improve LUE shoulder ROM to 70* flexion or greater for mid level reaching Baseline:  07/21/24: 80 degrees measured however some scaption noted Goal status: IN PROGRESS  ASSESSMENT:  CLINICAL IMPRESSION: Pt seen this date for tx. Participating well wih fluiodotherapy and at this time reports no concerns with recently fabricated resting hand splint. Pt would benefit from continued skilled services to address limited gross composite flexion of R hand, fabricate hand based splint for L hand as needed, and improve functional capability to complete ADL/IADLs  PERFORMANCE DEFICITS: in functional skills including ADLs, IADLs, coordination, dexterity, sensation, edema, ROM, strength, pain, flexibility, Fine motor control, Gross motor control, mobility,  body mechanics, cardiopulmonary status limiting function, decreased knowledge of precautions, decreased knowledge of use of DME, and UE functional use.   IMPAIRMENTS: are limiting patient from ADLs, IADLs,  rest and sleep, work, leisure, and social participation.   CO-MORBIDITIES: has co-morbidities such as orthostatic hypotension, bilateral rotator cuff tears that affects occupational performance. Patient will benefit from skilled OT to address above impairments and improve overall function.  MODIFICATION OR ASSISTANCE TO COMPLETE EVALUATION: Min-Moderate modification of tasks or assist with assess necessary to complete an evaluation.  OT OCCUPATIONAL PROFILE AND HISTORY: Detailed assessment: Review of records and additional review of physical, cognitive, psychosocial history related to current functional performance.  CLINICAL DECISION MAKING: Moderate - several treatment options, min-mod task modification necessary  REHAB POTENTIAL: Good  EVALUATION COMPLEXITY: Moderate    PLAN:  OT FREQUENCY: 2x/week  OT DURATION: 8 weeks  PLANNED INTERVENTIONS: 97535 self care/ADL training, 02889 therapeutic exercise, 97530 therapeutic activity, 97112 neuromuscular re-education, 97140 manual therapy, 97113 aquatic therapy, 97018 paraffin, 02960 fluidotherapy, 97010 moist heat, 97760 Orthotic Initial, 97763 Orthotic/Prosthetic subsequent, passive range of motion, functional mobility training, patient/family education, and DME and/or AE instructions  RECOMMENDED OTHER SERVICES: none at this time  CONSULTED AND AGREED WITH PLAN OF CARE: Patient and family member/caregiver  PLAN FOR NEXT SESSION:  Check resting hand splint Rt hand Continue fluidotherapy Coordination and grip strength activities Consider Lt hand based splint w/ index finger in MP flexion and PIP straight w/ strapping across PIP joint ADLS prn   Rocky Dutch, OT 08/11/2024, 3:07 PM
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
## 2024-08-12 DIAGNOSIS — M249 Joint derangement, unspecified: Secondary | ICD-10-CM | POA: Diagnosis not present

## 2024-08-12 DIAGNOSIS — M62838 Other muscle spasm: Secondary | ICD-10-CM | POA: Diagnosis not present

## 2024-08-12 DIAGNOSIS — I509 Heart failure, unspecified: Secondary | ICD-10-CM | POA: Diagnosis not present

## 2024-08-12 DIAGNOSIS — Z7982 Long term (current) use of aspirin: Secondary | ICD-10-CM | POA: Diagnosis not present

## 2024-08-12 DIAGNOSIS — N1831 Chronic kidney disease, stage 3a: Secondary | ICD-10-CM | POA: Diagnosis not present

## 2024-08-12 DIAGNOSIS — I951 Orthostatic hypotension: Secondary | ICD-10-CM | POA: Diagnosis not present

## 2024-08-12 DIAGNOSIS — E785 Hyperlipidemia, unspecified: Secondary | ICD-10-CM | POA: Diagnosis not present

## 2024-08-12 DIAGNOSIS — K219 Gastro-esophageal reflux disease without esophagitis: Secondary | ICD-10-CM | POA: Diagnosis not present

## 2024-08-12 DIAGNOSIS — D509 Iron deficiency anemia, unspecified: Secondary | ICD-10-CM | POA: Diagnosis not present

## 2024-08-12 DIAGNOSIS — E1142 Type 2 diabetes mellitus with diabetic polyneuropathy: Secondary | ICD-10-CM | POA: Diagnosis not present

## 2024-08-12 DIAGNOSIS — I2510 Atherosclerotic heart disease of native coronary artery without angina pectoris: Secondary | ICD-10-CM | POA: Diagnosis not present

## 2024-08-12 DIAGNOSIS — E1122 Type 2 diabetes mellitus with diabetic chronic kidney disease: Secondary | ICD-10-CM | POA: Diagnosis not present

## 2024-08-15 ENCOUNTER — Ambulatory Visit: Admitting: Family Medicine

## 2024-08-15 ENCOUNTER — Encounter: Payer: Self-pay | Admitting: Family Medicine

## 2024-08-15 VITALS — BP 110/62 | HR 85 | Temp 97.9°F | Wt 181.0 lb

## 2024-08-15 DIAGNOSIS — N390 Urinary tract infection, site not specified: Secondary | ICD-10-CM

## 2024-08-15 LAB — POC URINALSYSI DIPSTICK (AUTOMATED)
Bilirubin, UA: NEGATIVE
Blood, UA: POSITIVE
Glucose, UA: NEGATIVE
Ketones, UA: NEGATIVE
Nitrite, UA: POSITIVE
Protein, UA: POSITIVE — AB
Spec Grav, UA: >=1.03 — AB (ref 1.010–1.025)
Urobilinogen, UA: 0.2 U/dL
pH, UA: 5 (ref 5.0–8.0)

## 2024-08-15 MED ORDER — SULFAMETHOXAZOLE-TRIMETHOPRIM 800-160 MG PO TABS: 1.0000 | ORAL_TABLET | Freq: Two times a day (BID) | ORAL | 0 refills | Status: DC

## 2024-08-15 NOTE — Progress Notes (Signed)
   Subjective:    Patient ID: Jerome Vaughn, male    DOB: [birth year], 73 y.o.   MRN: [id number]  HPI Here for one month of hesitant urinary stream and some times for urgency. No fever or burning. He had a UTI in the hospital in August and he was treated with Cipro. The culture grew a pan-sensitive E coli. He says the symptoms went away until recently. He drinks plenty of fluids.    Review of Systems  Constitutional: Negative.   Respiratory: Negative.    Cardiovascular: Negative.   Genitourinary:  Positive for frequency and urgency. Negative for dysuria, flank pain and hematuria.       Objective:   Physical Exam Constitutional:      Appearance: Normal appearance.  Cardiovascular:     Rate and Rhythm: Normal rate and regular rhythm.     Pulses: Normal pulses.     Heart sounds: Normal heart sounds.  Pulmonary:     Effort: Pulmonary effort is normal.     Breath sounds: Normal breath sounds.  Abdominal:     Tenderness: There is no right CVA tenderness or left CVA tenderness.  Neurological:     Mental Status: He is alert.           Assessment & Plan:  UTI, this time we will treat him with 10 days of Bactrim DS. Culture the sample.  Garnette Olmsted, MD

## 2024-08-15 NOTE — Addendum Note (Signed)
 Addended by: LADONNA INOCENTE SAILOR on: 08/15/2024 04:52 PM   Modules accepted: Orders

## 2024-08-16 ENCOUNTER — Ambulatory Visit: Admitting: Occupational Therapy

## 2024-08-16 ENCOUNTER — Ambulatory Visit: Admitting: Physical Therapy

## 2024-08-18 ENCOUNTER — Encounter: Payer: Self-pay | Admitting: Physical Therapy

## 2024-08-18 ENCOUNTER — Ambulatory Visit: Admitting: Physical Therapy

## 2024-08-18 ENCOUNTER — Ambulatory Visit

## 2024-08-18 ENCOUNTER — Ambulatory Visit: Payer: Self-pay | Admitting: Family Medicine

## 2024-08-18 DIAGNOSIS — M6281 Muscle weakness (generalized): Secondary | ICD-10-CM | POA: Diagnosis not present

## 2024-08-18 DIAGNOSIS — R278 Other lack of coordination: Secondary | ICD-10-CM

## 2024-08-18 DIAGNOSIS — G8252 Quadriplegia, C1-C4 incomplete: Secondary | ICD-10-CM | POA: Diagnosis not present

## 2024-08-18 DIAGNOSIS — R2689 Other abnormalities of gait and mobility: Secondary | ICD-10-CM

## 2024-08-18 DIAGNOSIS — G8929 Other chronic pain: Secondary | ICD-10-CM

## 2024-08-18 DIAGNOSIS — R29818 Other symptoms and signs involving the nervous system: Secondary | ICD-10-CM

## 2024-08-18 DIAGNOSIS — R29898 Other symptoms and signs involving the musculoskeletal system: Secondary | ICD-10-CM

## 2024-08-18 DIAGNOSIS — R293 Abnormal posture: Secondary | ICD-10-CM

## 2024-08-18 DIAGNOSIS — M25512 Pain in left shoulder: Secondary | ICD-10-CM | POA: Diagnosis not present

## 2024-08-18 DIAGNOSIS — M25511 Pain in right shoulder: Secondary | ICD-10-CM | POA: Diagnosis not present

## 2024-08-18 LAB — URINE CULTURE
MICRO NUMBER:: 17244228
SPECIMEN QUALITY:: ADEQUATE

## 2024-08-18 NOTE — Therapy (Signed)
 OUTPATIENT PHYSICAL THERAPY NEURO TREATMENT   Patient Name: Jerome Vaughn MRN: [id number] DOB:1951-02-26, 73 y.o., male Today's Date: 08/18/2024   PCP: [name] LABOR, MD REFERRING PROVIDER: [name] PARAS, PA-C  END OF SESSION:  PT End of Session - 08/18/24 1405     Visit Number 12    Number of Visits 17   16+eval (1-2x/wk freq)   Date for Recertification  09/02/24    Authorization Type [employer]    Progress Note Due on Visit 10    PT Start Time 1404    PT Stop Time 1445    PT Time Calculation (min) 41 min    Equipment Utilized During Treatment Gait belt    Activity Tolerance Patient tolerated treatment well    Behavior During Therapy WFL for tasks assessed/performed               Past Medical History:  Diagnosis Date   Carpal tunnel syndrome    both hands   Colon polyps    Coronary artery disease    sees Dr. [name]   Diabetes mellitus    sees Dr. [name]   Diabetes mellitus [facility]) 2009   Ganglion cyst of wrist, right 1989   GERD (gastroesophageal reflux disease)    History of echocardiogram    a. Echo 4/17: Moderate LVH, EF 50-55%, mild LAE, no pericardial effusion   Hyperlipidemia    Hypertension    MGUS (monoclonal gammopathy of unknown significance)    sees Dr. [name]   Neuropathy    gets foot exams with Dr. [name] (podiatry)   Pneumonia    Rotator cuff injury    Past Surgical History:  Procedure Laterality Date   ANTERIOR CERVICAL DECOMP/DISCECTOMY FUSION N/A 04/19/2024   Procedure: ANTERIOR CERVICAL DECOMPRESSION/DISCECTOMY FUSION CERVICAL SIX-SEVEN;  Surgeon: [name] MATSU, MD;  Location: [facility] [address] OR;  Service: Neurosurgery;  Laterality: N/A;  ACDF C67   CARDIAC CATHETERIZATION N/A 11/21/2015   Procedure: Left Heart Cath and Coronary Angiography;  Surgeon: [name], MD;  Location: [facility] INVASIVE CV LAB;  Service: Cardiovascular;  Laterality: N/A;   COLONOSCOPY  03/18/2024   per Dr. [name], adenomatous polyp,  repeat in 5 yrs   CORONARY ARTERY BYPASS GRAFT N/A 11/22/2015   Procedure: CORONARY ARTERY BYPASS GRAFTING (CABG) times five using the left internal mammary, right greater saphenous vein EVH, and left thigh greater saphenous vein EVH;  Surgeon: [name], MD;  Location: [facility] OR;  Service: Open Heart Surgery;  Laterality: N/A;   CORONARY ARTERY BYPASS GRAFT  2017   CORONARY PRESSURE/FFR STUDY N/A 12/14/2018   Procedure: INTRAVASCULAR PRESSURE WIRE/FFR STUDY;  Surgeon: [name], MD;  Location: [name] INVASIVE CV LAB;  Service: Cardiovascular;  Laterality: N/A;   ESOPHAGOGASTRODUODENOSCOPY  03/18/2024   per Dr. [name], normal   frozen shoulder release Right    GANGLION CYST EXCISION Right 1989   LEFT HEART CATH AND CORS/GRAFTS ANGIOGRAPHY N/A 10/27/2017   Procedure: LEFT HEART CATH AND CORS/GRAFTS ANGIOGRAPHY;  Surgeon: Sharps [name], MD;  Location: [name];  Service: Cardiovascular;  Laterality: N/A;   LEFT HEART CATH AND CORS/GRAFTS ANGIOGRAPHY N/A 12/14/2018   Procedure: LEFT HEART CATH AND CORS/GRAFTS ANGIOGRAPHY;  Surgeon: [name], MD;  Location: [name] INVASIVE CV LAB;  Service: Cardiovascular;  Laterality: N/A;   TEE WITHOUT CARDIOVERSION N/A 11/22/2015   Procedure: TRANSESOPHAGEAL ECHOCARDIOGRAM (TEE);  Surgeon: [name], MD;  Location: [name] OR;  Service: Open Heart Surgery;  Laterality: N/A;   WRIST GANGLION EXCISION Right    Patient Active Problem List   Diagnosis Date Noted   Fall 05/27/2024   Orthostatic hypotension 05/27/2024   Post-traumatic spasticity 05/27/2024   Coping style affecting medical condition 05/04/2024   Central cord syndrome (HCC) 04/21/2024   Acute incomplete quadriplegia (HCC) 04/21/2024   Shock (HCC) 04/15/2024   Varicose veins of both lower extremities 07/03/2023   Full thickness rotator cuff tear 02/26/2023   Type 2 diabetes mellitus with diabetic polyneuropathy, without long-term current use of insulin  (HCC)  02/02/2023   Pancytopenia (HCC) 01/09/2023   OA (osteoarthritis) of knee 04/18/2022   Left shoulder pain 04/18/2022   GERD (gastroesophageal reflux disease)    Bacterial infection due to H. pylori 03/01/2020   Monoclonal gammopathy of unknown significance (MGUS) 03/01/2020   Iron  deficiency anemia 02/22/2020   Onychomycosis 02/08/2020   Rectal bleeding 01/24/2020   Indigestion 01/24/2020   Angina pectoris 12/12/2018   Erectile disorder due to medical condition in male 04/08/2018   Chronic combined systolic (congestive) and diastolic (congestive) heart failure (HCC) 07/29/2017   Pericardial effusion 12/24/2015   S/P CABG x 5 11/22/2015   Abnormal stress test 11/21/2015   Coronary artery disease of bypass graft of native heart with stable angina pectoris    Nonspecific abnormal electrocardiogram (ECG) (EKG) 11/07/2015   Essential hypertension 03/30/2011   Hypercholesterolemia 03/30/2011   Colon polyp 03/30/2011    ONSET DATE: 04/15/2024 (date of injury and hospital admission)  REFERRING DIAG: G82.50 (ICD-10-CM) - Quadriplegia, unspecified  THERAPY DIAG:  Other lack of coordination  Muscle weakness (generalized)  Other symptoms and signs involving the musculoskeletal system  Other symptoms and signs involving the nervous system  Chronic pain of both shoulders  Abnormal posture  Other abnormalities of gait and mobility  Rationale for Evaluation and Treatment: Rehabilitation  SUBJECTIVE:                                                                                                                                                                                             SUBJECTIVE STATEMENT:  Pt denies any acute changes since last visit. He has had no falls and is ambulating with SPC primarily.  BP was 132/90 this morning.  He continues to wear compression stockings, BLE ACE wrap,and his abdominal binder.  Pt accompanied by: significant other - Wife  Jerome Vaughn  PERTINENT HISTORY: DM, mild neurogenic B/B, CAD/CABG 2017, severe orthostatic hypotension, combined CHF, bilateral torn RTC, s/p C6-7 ACDF including discectomy for decompression of spinal cord and exiting nerve roots with foraminotomies 04/19/2024 per Dr. 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 Yes: NPRS scale: 0/10 Pain location: low back/upper hips Pain description: ache  Aggravating factors: exercise and walking w/ cane Relieving factors: have tried heat w/ mild + response as well as advil topical  PRECAUTIONS: Cervical, Fall, and Other: pt reports he is to wear soft cervical collar as needed - he prefers it for comfort currently; last available order states OOB AAT - will clarify w/ Dr. Cornelio - deferred to surgeon  RED FLAGS: Bowel or bladder incontinence: Yes: neurogenic B/B   WEIGHT BEARING RESTRICTIONS: No  FALLS: Has patient fallen in last 6 months? Yes. Number of falls 2 - see eval subjective  LIVING ENVIRONMENT: Lives with: lives with their spouse and adult grandchild Lives in: House/apartment Stairs: Yes: External: 3 steps; can reach both Has following equipment at home: Walker - 4 wheeled, shower chair, bed side commode, Grab bars, and transport  PLOF: Needs assistance with ADLs, Needs assistance with homemaking, Needs assistance with gait, and Needs assistance with transfers  PATIENT GOALS: to work on pain and walking  OBJECTIVE:  Note: Objective measures were completed at Evaluation unless otherwise noted.  DIAGNOSTIC FINDINGS:  Cervical MRI 04/16/2024: IMPRESSION: 1. No acute fracture or ligamentous injury of the cervical spine. 2. Focal hyperintense T2-weighted signal within the spinal cord at the C3-4 levels and at C7, which may indicate myelomalacia or spinal cord edema. 3. Severe spinal canal stenosis and bilateral neural  foraminal stenosis at C6-7. 4. Mild spinal canal stenosis and severe bilateral neural foraminal stenosis at C3-4. 5. Moderate left C2-3, left C4-5 and bilateral C5-6 neural foraminal stenosis.  COGNITION: Overall cognitive status: Within functional limits for tasks assessed   SENSATION: Light touch: impaired knee to ankle bilaterally  COORDINATION: BLE RAMS:  WNL Bilateral Heel-to-shin:  limited by functional weakness of hips  EDEMA:  Bilateral LE mild non-pitting edema at baseline prior to incident - wears TED hose for orthostatic management.  MUSCLE TONE: LLE: Mild, Hypertonic, and Modifed Ashworth Scale 1+ = Slight increase in muscle tone, manifested by a catch, followed by minimal resistance throughout the remainder (less than half) of the ROM  POSTURE: posterior pelvic tilt and upper trunk rigidity - limited assessment by abdominal binder and soft cervical collar  LOWER EXTREMITY ROM:     Active  Right Eval Left Eval  Hip flexion Grossly WNL  Hip extension   Hip abduction   Hip adduction   Hip internal rotation   Hip external rotation   Knee flexion   Knee extension   Ankle dorsiflexion   Ankle plantarflexion    Ankle inversion    Ankle eversion     (Blank rows = not tested)  LOWER EXTREMITY MMT:    MMT Right Eval Left Eval  Hip flexion 4 4  Hip extension    Hip abduction    Hip adduction    Hip internal rotation    Hip external rotation    Knee flexion    Knee extension 4+ 4+  Ankle dorsiflexion 4- 4-  Ankle plantarflexion    Ankle inversion    Ankle eversion    (Blank rows = not tested)  BED MOBILITY:  Findings: Sit to supine Modified independence Supine to sit Modified independence Rolling to Right Modified independence Rolling to Left Modified independence Pt reports using insertable bed rails for independence.  TRANSFERS: Sit to stand: SBA  Assistive device  utilized: Environmental Consultant - 4 wheeled     Stand to sit: SBA  Assistive device utilized:  Environmental Consultant - 4 wheeled     Chair to chair: SBA  Assistive device utilized: Environmental Consultant - 4 wheeled       RAMP:  Not tested  CURB:  Not tested  STAIRS: Not tested GAIT: Findings: Gait Characteristics: step through pattern, decreased stride length, decreased hip/knee flexion- Right, decreased hip/knee flexion- Left, decreased trunk rotation, trunk flexed, and narrow BOS, Distance walked: various clinic distances, Assistive device utilized:Walker - 4 wheeled, Level of assistance: SBA, and Comments: No overt LOB, crossover stepping, ataxia or marked pathway deviation.  Slow pace.  FUNCTIONAL TESTS:  5 times sit to stand: 23.03 sec intermittent hand support on rollator, pt denies dizziness or lightheadedness Timed up and go (TUG): TBA 2 minute walk test: TBA Berg Balance Scale:  TBA    PATIENT SURVEYS:  None completed due to time.                                                                                                                              TREATMENT DATE: 08/18/2024   NMR: 6 heel taps 2x10 each LE w/ attempts to progress to unilateral UE support - increased torsional LOB on retro step up 6 resisted toe taps (red theraband) 2x20 Resisted marching (red theraband) x20 Resisted step up (resistance at pelvis/trunk) x20 alt LE Bilateral leg press x3 at 50lbs > 3x8 at 80 lbs, min cues to increase eccentric control  TherAct SciFit sprints level 5.5 for 8 minutes using BUE/BLEs for dynamic HIIT style workout and BLE strengthening.  PATIENT EDUCATION: Education details: Try not to utilize clinic w/c to enter/exit appointments UNLESS he is feeling lightheaded/dizzy, continue to use [facility] for all mobility, continue HEP w/ additions today.  Can remove a layer of compression if BP is elevated as it was this morning and recheck BP to assess for stability and to prevent from having high BP for prolonged periods.  Discussed aquatic appts and scheduling closer to re-cert when pt is more certain  of desired frequency. Person educated: Patient and Spouse Education method: Medical Illustrator Education comprehension: verbalized understanding, returned demonstration, and needs further education  HOME EXERCISE PROGRAM: Access Code: [email or web address] URL: [email or web address] Date: 08/09/2024 Prepared by: Daved Bull  Exercises - Supine March  - 1 x daily - 7 x weekly - 3 sets - 10 reps - Supine Posterior Pelvic Tilt  - 1 x daily - 7 x weekly - 3 sets - 10 reps - Supine Ankle Pumps  - 1 x daily - 7 x weekly - 3 sets - 10 reps - Supine Heel Slide  - 1 x daily - 7 x weekly - 3 sets - 10 reps - Side Stepping with Resistance at Thighs and Counter Support  - 1 x daily - 5 x weekly - 3 sets - 10 reps - Forward Backward Monster Walk with Band at [employer]  and Counter Support  - 1 x daily - 5 x weekly - 3 sets - 10 reps - Walking Step Over  - 1 x daily - 5 x weekly - 3 sets - 10 reps - Standing Tandem Balance with Counter Support  - 1 x daily - 5 x weekly - 1 sets - 2-3 reps - 45-60 seconds hold - Tandem Walking with Counter Support  - 1 x daily - 5 x weekly - 3 sets - 10 reps - Romberg Stance Eyes Closed on Foam Pad  - 1 x daily - 5 x weekly - 3 sets - 10 reps  Verbally added LTR and SKTC  Can try slow STS at home and daily walks in home w/ supervision 2-5 minutes on days where BP is better.  GOALS: Goals reviewed with patient? Yes  SHORT TERM GOALS: Target date: 07/22/2024  Pt will be independent and compliant with introductory strength and balance focused HEP in order to maintain functional progress and improve mobility. Baseline:  Initiated on eval. Goal status: MET  2.  Pt will decrease 5xSTS to </=18.03 seconds in order to demonstrate decreased risk for falls and improved functional bilateral LE strength and power. Baseline: 23.03 sec w/ int hand support, 19.38 sec with BUE support (10/23) Goal status: IN PROGRESS  3.  TUG to be assessed w/ STG set as  appropriate. Baseline: 15.81 sec w/ rollator SBA (10/6), 13.75 sec with rollator SBA (10/23) Goal status: REVISED - LTG only  4.  Pt will ambulate>/=396 feet on to demonstrate improved endurance for functional tasks in home and community. Baseline: 50' w/ rollator SBA (10/6), 358 ft with rollator SBA (10/23) Goal status: IN PROGRESS  5.  Pt will increase BERG balance score to >/=45/56 to demonstrate improved static balance. Baseline: 40/56 (10/6), 42/56 (10/23) Goal status: IN PROGRESS  LONG TERM GOALS: Target date: 09/01/2024 (updated to cert date)  Pt will be independent and compliant with advanced and finalized strength and balance focused HEP in order to maintain functional progress and improve mobility. Baseline: 3 days consistently (11/20) Goal status: IN PROGRESS  2.  Pt will decrease 5xSTS to </=13.03 seconds in order to demonstrate decreased risk for falls and improved functional bilateral LE strength and power. Baseline: 23.03 sec w/ int hand support, 19.38 sec with BUE support (10/23) Goal status: INITIAL  3.  Pt will demonstrate TUG of </=12 seconds in order to decrease risk of falls and improve functional mobility using LRAD. Baseline: 15.81 sec w/ rollator SBA (10/6), 13.75 sec with rollator SBA (10/23) Goal status: INITIAL  4.  Pt will ambulate>/=400 feet on to demonstrate improved endurance for functional tasks in home and community. Baseline: 296' w/ rollator SBA (10/6), 358 ft with rollator SBA (10/23) Goal status: REVISED/DOWNGRADED  5.  Pt will increase BERG balance score to >/=50/56 to demonstrate improved static balance. Baseline: 40/56 (10/6), 42/56 (10/23) Goal status: INITIAL  6.  Pt will appropriately wean from soft cervical collar to improve postural awareness, ROM, and visual scanning of environment needed for upright stability. Baseline: Following up w/ managing MD Goal status: INITIAL  ASSESSMENT:  CLINICAL IMPRESSION: Emphasis of  skilled PT session on increased work on increasing NMR control on BLE and improved strength and cardiovascular response to increased repetitions and resistance provided.  Focused on hip flexion and LE clearance with most tasks today w/ pt more consistently achieving 11.5 inch stride on SciFit task this visit.  He is progressing well towards LTGs and  may benefit from consideration of re-cert to add aquatic modality as supplement to land interventions at that time.  Continue POC.  OBJECTIVE IMPAIRMENTS: Abnormal gait, decreased activity tolerance, decreased balance, decreased coordination, decreased endurance, decreased mobility, difficulty walking, decreased strength, increased edema, impaired sensation, impaired tone, impaired UE functional use, improper body mechanics, postural dysfunction, and pain.   ACTIVITY LIMITATIONS: carrying, lifting, bending, standing, squatting, stairs, transfers, bathing, toileting, dressing, reach over head, and locomotion level  PARTICIPATION LIMITATIONS: meal prep, cleaning, laundry, medication management, driving, shopping, and community activity  PERSONAL FACTORS: Age, Fitness, Past/current experiences, Time since onset of injury/illness/exacerbation, and 1-2 comorbidities: bilateral torn RTC, severe orthostatic hypotension are also affecting patient's functional outcome.   REHAB POTENTIAL: Good  CLINICAL DECISION MAKING: Evolving/moderate complexity  EVALUATION COMPLEXITY: Moderate  PLAN:  PT FREQUENCY: 1-2x/week  PT DURATION: 8 weeks  PLANNED INTERVENTIONS: [id number]- PT Re-evaluation, 97750- Physical Performance Testing, 97110-Therapeutic exercises, 97530- Therapeutic activity, [id number]- Neuromuscular re-education, 97535- Self Care, [id number]- Manual therapy, [id number]- Gait training, [phone]- Orthotic Initial, [phone]- Orthotic/Prosthetic subsequent, [phone]- Aquatic Therapy, [phone]- Electrical stimulation (manual), Patient/Family education, Balance training, Stair training,  Taping, Joint mobilization, Vestibular training, DME instructions, Cryotherapy, and Moist heat  PLAN FOR NEXT SESSION:  CHECK BP!  How is HEP?  Expand HEP for dynamic balance and strength. SciFit for endurance - pt enjoys.  Bilateral shoulder pain management as needed.  Gait training.  L NMR/tone management.  Bilateral hip strengthening.  Standing balance on decline/incline.  Resisted walking/perturbations static and dynamic.  Leg press vs supported squats.  Can consider aquatics if pt able to make it 2x/wk? - may consider for knee pain! - discussed w/ patient 11/20 and he is interested at re-cert - he will think about frequency options discussed.  Daved KATHEE Bull, PT, DPT   08/18/2024, 2:47 PM

## 2024-08-18 NOTE — Therapy (Signed)
 OUTPATIENT OCCUPATIONAL THERAPY NEURO TREATMENT  Patient Name: Jerome Vaughn MRN: [id number] DOB:[birth year], 73 y.o., male Today's Date: 08/18/2024  PCP: Jerome Vaughn LABOR, MD REFERRING PROVIDER: Pegge Vaughn PARAS, PA-C  END OF SESSION:  OT End of Session - 08/18/24 1449     Visit Number 12    Number of Visits 16    Date for Recertification  08/22/24    Authorization Type [facility] - form submitted    Authorization Time Period 06/22/24-08/29/24    Authorization - Visit Number 12    Authorization - Number of Visits 16    Progress Note Due on Visit 10    OT Start Time 1445    OT Stop Time 1530    OT Time Calculation (min) 45 min    Equipment Utilized During Treatment fluidotherapy    Activity Tolerance Patient tolerated treatment well    Behavior During Therapy [facility] for tasks assessed/performed              Past Medical History:  Diagnosis Date   Carpal tunnel syndrome    both hands   Colon polyps    Coronary artery disease    sees Dr. [name]   Diabetes mellitus    sees Dr. Stefano Vaughn   Diabetes mellitus [facility]) 2009   Ganglion cyst of wrist, right 1989   GERD (gastroesophageal reflux disease)    History of echocardiogram    a. Echo 4/17: Moderate LVH, EF 50-55%, mild LAE, no pericardial effusion   Hyperlipidemia    Hypertension    MGUS (monoclonal gammopathy of unknown significance)    sees Dr. Norleen Vaughn   Neuropathy    gets foot exams with Dr. Thresa Vaughn (podiatry)   Pneumonia    Rotator cuff injury    Past Surgical History:  Procedure Laterality Date   ANTERIOR CERVICAL DECOMP/DISCECTOMY FUSION N/A 04/19/2024   Procedure: ANTERIOR CERVICAL DECOMPRESSION/DISCECTOMY FUSION CERVICAL SIX-SEVEN;  Surgeon: [facility] Dorn MATSU, MD;  Location: [facility];  Service: Neurosurgery;  Laterality: N/A;  ACDF C67   CARDIAC CATHETERIZATION N/A 11/21/2015   Procedure: Left Heart Cath and Coronary Angiography;  Surgeon: [facility] LELON Sharps, MD;  Location: [facility];   Service: Cardiovascular;  Laterality: N/A;   COLONOSCOPY  03/18/2024   per Dr. [name], adenomatous polyp, repeat in 5 yrs   CORONARY ARTERY BYPASS GRAFT N/A 11/22/2015   Procedure: CORONARY ARTERY BYPASS GRAFTING (CABG) times five using Jerome left internal mammary, right greater saphenous vein EVH, and left thigh greater saphenous vein EVH;  Surgeon: [facility] Fleeta Ochoa, MD;  Location: [facility];  Service: Open Heart Surgery;  Laterality: N/A;   CORONARY ARTERY BYPASS GRAFT  2017   CORONARY PRESSURE/FFR STUDY N/A 12/14/2018   Procedure: INTRAVASCULAR PRESSURE WIRE/FFR STUDY;  Surgeon: [facility] Candyce RAMAN, MD;  Location: [facility];  Service: Cardiovascular;  Laterality: N/A;   ESOPHAGOGASTRODUODENOSCOPY  03/18/2024   per Dr. [name], normal   frozen shoulder release Right    GANGLION CYST EXCISION Right 1989   LEFT HEART CATH AND CORS/GRAFTS ANGIOGRAPHY N/A 10/27/2017   Procedure: LEFT HEART CATH AND CORS/GRAFTS ANGIOGRAPHY;  Surgeon: Vaughn Jerome LELON, MD;  Location: [facility];  Service: Cardiovascular;  Laterality: N/A;   LEFT HEART CATH AND CORS/GRAFTS ANGIOGRAPHY N/A 12/14/2018   Procedure: LEFT HEART CATH AND CORS/GRAFTS ANGIOGRAPHY;  Surgeon: Jerome Candyce RAMAN, MD;  Location: [facility] INVASIVE CV [facility];  Service: Cardiovascular;  Laterality: N/A;   TEE WITHOUT CARDIOVERSION N/A 11/22/2015   Procedure:  TRANSESOPHAGEAL ECHOCARDIOGRAM (TEE);  Surgeon: [facility] Fleeta Ochoa, MD;  Location: [facility];  Service: Open Heart Surgery;  Laterality: N/A;   WRIST GANGLION EXCISION Right    Patient Active Problem List   Diagnosis Date Noted   Fall 05/27/2024   Orthostatic hypotension 05/27/2024   Post-traumatic spasticity 05/27/2024   Coping style affecting medical condition 05/04/2024   Central cord syndrome (HCC) 04/21/2024   Acute incomplete quadriplegia (HCC) 04/21/2024   Shock (HCC) 04/15/2024   Varicose veins of both lower extremities 07/03/2023   Full thickness rotator cuff tear 02/26/2023    Type 2 diabetes mellitus with diabetic polyneuropathy, without long-term current use of insulin  (HCC) 02/02/2023   Pancytopenia (HCC) 01/09/2023   OA (osteoarthritis) of knee 04/18/2022   Left shoulder pain 04/18/2022   GERD (gastroesophageal reflux disease)    Bacterial infection due to H. pylori 03/01/2020   Monoclonal gammopathy of unknown significance (MGUS) 03/01/2020   Iron  deficiency anemia 02/22/2020   Onychomycosis 02/08/2020   Rectal bleeding 01/24/2020   Indigestion 01/24/2020   Angina pectoris 12/12/2018   Erectile disorder due to medical condition in male 04/08/2018   Chronic combined systolic (congestive) and diastolic (congestive) heart failure (HCC) 07/29/2017   Pericardial effusion 12/24/2015   S/P CABG x 5 11/22/2015   Abnormal stress test 11/21/2015   Coronary artery disease of bypass graft of native heart with stable angina pectoris    Nonspecific abnormal electrocardiogram (ECG) (EKG) 11/07/2015   Essential hypertension 03/30/2011   Hypercholesterolemia 03/30/2011   Colon polyp 03/30/2011    ONSET DATE: 05/17/2024 (referral date)    REFERRING DIAG: G82.50 (ICD-10-CM) - Quadriplegia, unspecified S14.129A (ICD-10-CM) - Central cord syndrome, initial encounter (HCC)    THERAPY DIAG:  Other lack of coordination  Quadriplegia, C1-C4 incomplete (HCC)  Muscle weakness (generalized)  Other symptoms and signs involving Jerome musculoskeletal system  Rationale for Evaluation and Treatment: Rehabilitation  SUBJECTIVE:   SUBJECTIVE STATEMENT: Pt walked into therapy today with SPC, reports he has been able to pick up quarters recently.  Pt accompanied by: [name]   PERTINENT HISTORY: Presented 04/15/2024 after being on a roof cutting a tree branch when Jerome tree fell on him pinning him against Jerome roof. Pt underwent ACDF C6-7 on 04/19/24. No loss of consciousness. PMH:  significant for diabetes mellitus, CAD with CABG 2017, hypertension,  hyperlipidemia  IMPRESSION from MRI on 04/16/24: 1. No acute fracture Vaughn ligamentous injury of Jerome cervical spine. 2. 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 3. Severe spinal canal stenosis and bilateral neural foraminal stenosis at C6-7. 4. Mild spinal canal stenosis and severe bilateral neural foraminal stenosis at C3-4. 5. Moderate left C2-3, left C4-5 and bilateral C5-6 neural foraminal stenosis.  PRECAUTIONS: Cervical, Fall, and Other: soft collar, orthostatic hypotension (wears abdominal binder and TED hoses), lifting restrictions   WEIGHT BEARING RESTRICTIONS: ? UE wt bearing  PAIN:  Are you having pain? Yes: NPRS scale: 5/10 Pain location: B shoulders  Pain description: stiffness  Aggravating factors: standing Relieving factors: changing positions, OTC meds and muscle relaxers  FALLS: Has patient fallen in last 6 months? Yes. 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  HAND DOMINANCE: Right  ADLs: Transfers/ambulation related to ADLs: rollator Eating: mod I using Rt hand mostly with adapted utensils, wife cuts food Grooming: mod I for brushing teeth/shaving using both hands, except wife brushes pt's hair UB Dressing: min assist - improving LB Dressing: max assist for TED hose, fluctuating assist for pants/shoes Toileting: supervision and assist  for wiping Bathing: assist w/ lower legs and back  Tub Shower transfers: min assist Equipment: Shower seat with back and Grab bars  IADLs:  Shopping: pt has gone with wife 2x since d/c from hospital Light housekeeping: dependent Meal Prep: dependent Community mobility: dependent Medication management: wife places in pillbox, wife assists Handwriting: 90% legible in print (pt reports signature was always bad)   MOBILITY STATUS: using SPC   UPPER EXTREMITY ROM:  RUE AROM at shoulder and elbow WFL's. Limited more distally with 75% wrist and 50% composite flexion  LUE shoulder movement limited in approx 45* scaption (lesser true flex), 75% ER, 90% IR, Elbows distally WFLs w/ difficulty w/ index PIP extension   UPPER EXTREMITY MMT:   not tested d/t precautions, current deficits and premorbid rotator cuff tears bilaterally    HAND FUNCTION: Grip strength: Right: 5.2 lbs; Left: 29.3 lbs  COORDINATION: 9 Hole Peg test: Right: placed 6 in 2 min;   Left: 117.83 sec Box and Blocks:  Right 31 blocks, Left 35 blocks  SENSATION: Light touch: WFL Pt reports some numbness in fingertips (premorbid but more pronounced since accident)   EDEMA: mild Rt hand, slightly worse in fingers Rt hand  MUSCLE TONE: pt has reported spasms and on muscle relaxer's (take at night)   COGNITION: Overall cognitive status: Within functional limits for tasks assessed  VISION: Subjective report: Pt reports no changes  Baseline vision: Bifocals Visual history: none    PERCEPTION: Not tested  PRAXIS: Not tested  OBSERVATIONS: bilateral hand deficits (Rt worse than Lt), bilateral shoulder deficits (Lt worse than Rt)  Premorbid rotator cuff tears but pt reports he had majority of ROM both shoulders prior to accident - now limited in ROM Lt shoulder                                                                                                                             TREATMENT DATE: 08/18/24  Pt  placed BUE in Fluidotherapy machine with supervised ROM x 8 min. Pt was educated to complete tendon glides and gross composite fist during modality time to improve ROM and decrease pain/stiffness of affected extremity by use of Jerome machine's massaging action and thermal properties.    Pt engaged in FM coordination activities, picking up small marbles, grading down allowed by placing washcloth on tabletop and placing marbles on cloth for improved traction.  Pt attempted to hold all marbles in his hand, was able to hold 2-3 but d/t limited digit flexion especially on ulnar side was unable to hold these for very long. Pt encouraged to work digital opposition by completing tip to tip pinch at home with thumb to ring fingerto improve flexibility. Pt agreed.   Pt utilized small tweezers and removed small items through tip to tip pinch to carry over with grooming tasks.   PATIENT EDUCATION: Education details: SEE ABOVE Person educated: Patient and Spouse Education method: Explanation, Verbal cues, and Handouts Education comprehension: verbalized understanding and returned demonstration  HOME EXERCISE PROGRAM: 07/04/24: yellow theraputty HEP (ACCESS CODE [id number]), coordination 07/28/24: Isolated finger movements (ACCESS CODE [id number]) 08/09/24: splint wear and care    GOALS: Goals reviewed with patient? Yes  SHORT TERM GOALS: Target date: 07/22/24  Pt independent with HEP for bilateral coordination and grip strength Baseline:  Goal status: MET   2.  Pt independent with modified shoulder HEP (premorbid rotator cuff tear)  Baseline:  Goal status: MET   3.  Pt to consistently be mod I for donning/doffing overhead shirts and UB bathing w/ AE prn Baseline:  07/21/24: Pt reports donning/doffing UB short sleeve shirts independently/Mod I, needs Min A for donning long sleeves (adjustments at sleeves only). Pt also educated in and looking to obtain long handled sponge. Goal status: PARTIALLY  MET  4.  Pt to perform LE dressing w/ min assist using AE prn (w/ exception to TED hoses)  Baseline: mod to max assist 07/21/24: occasional assistance for pants at this time, doing his own shoes Goal status: MET  5.  Grip strength to increase by 10 lbs Vaughn more on Rt side and 5 lbs Vaughn more on Lt side Baseline: 5.2 lbs, 29.3 lbs 07/21/24: 13.0 lbs, 29.1 lbs Goal status: IN PROGRESS  6.  Improve BUE function as evidenced by increasing Box & Blocks by 5 Vaughn more blocks Baseline: Rt = 31, Lt = 35 07/21/24: R: 36, L: 39 Goal status: PARTIALLY MET (met on Rt, almost met on Lt)     LONG TERM GOALS: Target date: 08/22/24  Independent with updated HEP  Baseline:  Goal status: IN PROGRESS  2.  Pt to be mod I for all BADLS including brushing hair and LB dressing (exception TED hoses)  Baseline:  07/21/24: Pt reports improved grooming and is obtaining long handled comb, pt reports occasional assistance with pants especially when already wearing compression stockings Goal status: IN PROGRESS  3.  Improve Rt hand coordination as evidenced by reducing speed on 9 hole peg test in under 2 minutes Baseline:  07/21/24: 1 minutes, 24 seconds Goal status:  MET  4.  Improve Lt hand coordination as evidenced by reducing speed on 9 hole peg test in under 2 minutes Baseline:  07/21/24: 1 minute, 29 seconds Goal status:  MET  5.  Pt to perform light IADLS in standing with countertop support w/o LOB  Baseline:  Goal status: IN PROGRESS   6.  Pt to improve LUE shoulder ROM to 70* flexion Vaughn greater for mid level reaching Baseline:  07/21/24: 80 degrees measured however some scaption noted Goal status: IN PROGRESS  ASSESSMENT:  CLINICAL IMPRESSION: Pt seen this date for tx. Participating well wih fluiodotherapy and coordination tasks. Pt would benefit from continued skilled services to address limited gross composite flexion and digital opposition of R hand, fabricate hand based splint for L  hand as needed, and improve functional capability to complete ADL/IADLs  PERFORMANCE DEFICITS: in functional skills including ADLs, IADLs, coordination, dexterity, sensation, edema, ROM, strength, pain, flexibility, Fine motor control, Gross motor control, mobility, body mechanics, cardiopulmonary status limiting function, decreased knowledge of precautions, decreased knowledge of use of DME, and UE functional use.   IMPAIRMENTS: are limiting patient from ADLs, IADLs, rest and sleep, work, leisure, and social participation.   CO-MORBIDITIES: has co-morbidities such as orthostatic hypotension, bilateral rotator cuff tears that affects occupational performance. Patient will benefit from skilled OT to address above impairments and improve overall function.  MODIFICATION Vaughn ASSISTANCE TO COMPLETE EVALUATION: Min-Moderate modification of tasks Vaughn assist with assess necessary to complete an evaluation.  OT OCCUPATIONAL PROFILE AND HISTORY: Detailed assessment: Review of records and additional review of physical, cognitive, psychosocial history related to current functional performance.  CLINICAL DECISION MAKING: Moderate - several treatment options, min-mod task modification necessary  REHAB POTENTIAL: Good  EVALUATION COMPLEXITY: Moderate    PLAN:  OT FREQUENCY: 2x/week  OT DURATION: 8 weeks  PLANNED INTERVENTIONS: 97535 self care/ADL training, 02889 therapeutic exercise, 97530 therapeutic activity, 97112 neuromuscular re-education, 97140 manual therapy, 97113 aquatic therapy, 97018 paraffin, 02960 fluidotherapy, 97010 moist heat, 97760 Orthotic Initial, 97763 Orthotic/Prosthetic subsequent, passive range of motion, functional mobility training, patient/family education, and DME and/Vaughn AE instructions  RECOMMENDED OTHER SERVICES: none at this time  CONSULTED AND AGREED WITH PLAN OF CARE: Patient and family member/caregiver  PLAN FOR NEXT SESSION:  Check resting hand splint Rt  hand Continue fluidotherapy Coordination and grip strength activities Consider Lt hand based splint w/ index finger in MP flexion and PIP straight w/ strapping across PIP joint ADLS prn Shoulder activities to promote proper scapular movement    Rocky Dutch, OT 08/18/2024, 4:22 PM

## 2024-08-22 ENCOUNTER — Encounter: Payer: Self-pay | Admitting: Physical Therapy

## 2024-08-22 ENCOUNTER — Ambulatory Visit: Admitting: Physical Therapy

## 2024-08-22 ENCOUNTER — Ambulatory Visit

## 2024-08-22 VITALS — BP 176/99 | HR 72

## 2024-08-22 DIAGNOSIS — G8252 Quadriplegia, C1-C4 incomplete: Secondary | ICD-10-CM

## 2024-08-22 DIAGNOSIS — R2689 Other abnormalities of gait and mobility: Secondary | ICD-10-CM

## 2024-08-22 DIAGNOSIS — M6281 Muscle weakness (generalized): Secondary | ICD-10-CM

## 2024-08-22 DIAGNOSIS — R208 Other disturbances of skin sensation: Secondary | ICD-10-CM

## 2024-08-22 DIAGNOSIS — R293 Abnormal posture: Secondary | ICD-10-CM

## 2024-08-22 DIAGNOSIS — R278 Other lack of coordination: Secondary | ICD-10-CM | POA: Diagnosis not present

## 2024-08-22 DIAGNOSIS — G8929 Other chronic pain: Secondary | ICD-10-CM | POA: Diagnosis not present

## 2024-08-22 DIAGNOSIS — M62838 Other muscle spasm: Secondary | ICD-10-CM

## 2024-08-22 DIAGNOSIS — R29898 Other symptoms and signs involving the musculoskeletal system: Secondary | ICD-10-CM

## 2024-08-22 DIAGNOSIS — R29818 Other symptoms and signs involving the nervous system: Secondary | ICD-10-CM | POA: Diagnosis not present

## 2024-08-22 DIAGNOSIS — M25511 Pain in right shoulder: Secondary | ICD-10-CM | POA: Diagnosis not present

## 2024-08-22 DIAGNOSIS — M25512 Pain in left shoulder: Secondary | ICD-10-CM | POA: Diagnosis not present

## 2024-08-22 NOTE — Patient Instructions (Addendum)
 WEBSITE: [email or web address]     Desensitization Techniques  One way to desensitize a painful incision or area is by rubbing it with different textures. This will make your limb more tolerant to touch and pressure. Before you begin, make sure your hands and the materials you're using are clean.  To rub your painful/sensitive area with different textures:  Sit in a comfortable position with the painful/sensitive area uncovered. Start with a material that is soft, such as a cotton ball, silk or soft towel. Rub your painful/sensitive area in all directions. Start with a light pressure and gradually increase the pressure. Vary the textures you use as you are able to tolerate them. Start with soft materials like cotton balls or a makeup brush. Progress to materials that are rougher. Examples include a paper towel, cloth towel, wool, or velcro. As you progress, gradually increase the pressure and roughness of the texture you use. Be careful not to rub over your incision if you still have staples or sutures in place, or if there are any open areas. Rub your limb for at least 30 seconds progressing up to a minute or two as often as you can tolerate, or as recommended by your healthcare provider.  Other methods for desensitization include:  Dipping your affected area into a bowl of dry rice, sand, kidney beans, cold water or warm water. Allow cool or warm water to run over the area. Using a TENS unit - make sure to ask your physician or physical therapist prior to using one. If you choose to start with the method of dipping your affected area into a medium such as rice, sand, or water make sure to move the affected area around in the bowl until you cannot tolerate it or you reach one to two minutes, whichever comes first. Use a combination of these methods for 10 to 15 minutes, 3-4 times per [place].  The goal of  desensitization is to inhibit or interrupt the body's interpretation of routine stimuli as painful. It does not assure that these stimuli will become pleasant or enjoyable, but that they will no longer provoke an extreme pain response.
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
## 2024-08-22 NOTE — Therapy (Addendum)
 OUTPATIENT OCCUPATIONAL THERAPY NEURO TREATMENT  Patient Name: Jerome Vaughn MRN: [id number] DOB:04-Aug-1951, 73 y.o., male Today's Date: 08/22/2024  PCP: Jerome Vaughn REFERRING PROVIDER: [name] PARAS, Vaughn  END OF SESSION:  OT End of Session - 08/22/24 1404     Visit Number 13    Number of Visits 16    Date for Recertification  08/22/24    Authorization Type [facility] - form submitted    Authorization Time Period 06/22/24-08/29/24    Authorization - Visit Number 13    Authorization - Number of Visits 16    Progress Note Due on Visit 10    OT Start Time 1400    OT Stop Time 1445    OT Time Calculation (min) 45 min    Equipment Utilized During Treatment testing materials    Activity Tolerance Patient tolerated treatment well    Behavior During Therapy WFL for tasks assessed/performed              Past Medical History:  Diagnosis Date   Carpal tunnel syndrome    both hands   Colon polyps    Coronary artery disease    sees Jerome Vaughn   Diabetes mellitus    sees Dr. Stefano Vaughn   Diabetes mellitus [facility]) 2009   Ganglion cyst of wrist, right 1989   GERD (gastroesophageal reflux disease)    History of echocardiogram    a. Echo 4/17: Moderate LVH, EF 50-55%, mild LAE, no pericardial effusion   Hyperlipidemia    Hypertension    MGUS (monoclonal gammopathy of unknown significance)    sees Jerome Vaughn   Neuropathy    gets foot exams with Jerome Vaughn (podiatry)   Pneumonia    Rotator cuff injury    Past Surgical History:  Procedure Laterality Date   ANTERIOR CERVICAL DECOMP/DISCECTOMY FUSION N/A 04/19/2024   Procedure: ANTERIOR CERVICAL DECOMPRESSION/DISCECTOMY FUSION CERVICAL SIX-SEVEN;  Surgeon: Jerome Dorn MATSU, Vaughn;  Location: [facility] OR;  Service: Neurosurgery;  Laterality: N/A;  ACDF C67   CARDIAC CATHETERIZATION N/A 11/21/2015   Procedure: Left Heart Cath and Coronary Angiography;  Surgeon: Jerome Vaughn Sharps, Vaughn;  Location: [facility] INVASIVE CV LAB;   Service: Cardiovascular;  Laterality: N/A;   COLONOSCOPY  03/18/2024   per Jerome Vaughn, adenomatous polyp, repeat in 5 yrs   CORONARY ARTERY BYPASS GRAFT N/A 11/22/2015   Procedure: CORONARY ARTERY BYPASS GRAFTING (CABG) times five using the left internal mammary, right greater saphenous vein EVH, and left thigh greater saphenous vein EVH;  Surgeon: Jerome Fleeta Ochoa, Vaughn;  Location: [facility] OR;  Service: Open Heart Surgery;  Laterality: N/A;   CORONARY ARTERY BYPASS GRAFT  2017   CORONARY PRESSURE/FFR STUDY N/A 12/14/2018   Procedure: INTRAVASCULAR PRESSURE WIRE/FFR STUDY;  Surgeon: Jerome Candyce RAMAN, Vaughn;  Location: [facility] INVASIVE CV LAB;  Service: Cardiovascular;  Laterality: N/A;   ESOPHAGOGASTRODUODENOSCOPY  03/18/2024   per Jerome Vaughn, normal   frozen shoulder release Right    GANGLION CYST EXCISION Right 1989   LEFT HEART CATH AND CORS/GRAFTS ANGIOGRAPHY N/A 10/27/2017   Procedure: LEFT HEART CATH AND CORS/GRAFTS ANGIOGRAPHY;  Surgeon: Vaughn Jerome LELON, Vaughn;  Location: [name];  Service: Cardiovascular;  Laterality: N/A;   LEFT HEART CATH AND CORS/GRAFTS ANGIOGRAPHY N/A 12/14/2018   Procedure: LEFT HEART CATH AND CORS/GRAFTS ANGIOGRAPHY;  Surgeon: Jerome Candyce RAMAN, Vaughn;  Location: [facility] INVASIVE CV LAB;  Service: Cardiovascular;  Laterality: N/A;   TEE WITHOUT CARDIOVERSION N/A 11/22/2015  Procedure: TRANSESOPHAGEAL ECHOCARDIOGRAM (TEE);  Surgeon: Jerome Fleeta Ochoa, Vaughn;  Location: [facility] OR;  Service: Open Heart Surgery;  Laterality: N/A;   WRIST GANGLION EXCISION Right    Patient Active Problem List   Diagnosis Date Noted   Fall 05/27/2024   Orthostatic hypotension 05/27/2024   Post-traumatic spasticity 05/27/2024   Coping style affecting medical condition 05/04/2024   Central cord syndrome (HCC) 04/21/2024   Acute incomplete quadriplegia (HCC) 04/21/2024   Shock (HCC) 04/15/2024   Varicose veins of both lower extremities 07/03/2023   Full thickness rotator cuff tear  02/26/2023   Type 2 diabetes mellitus with diabetic polyneuropathy, without long-term current use of insulin  (HCC) 02/02/2023   Pancytopenia (HCC) 01/09/2023   OA (osteoarthritis) of knee 04/18/2022   Left shoulder pain 04/18/2022   GERD (gastroesophageal reflux disease)    Bacterial infection due to H. pylori 03/01/2020   Monoclonal gammopathy of unknown significance (MGUS) 03/01/2020   Iron  deficiency anemia 02/22/2020   Onychomycosis 02/08/2020   Rectal bleeding 01/24/2020   Indigestion 01/24/2020   Angina pectoris 12/12/2018   Erectile disorder due to medical condition in male 04/08/2018   Chronic combined systolic (congestive) and diastolic (congestive) heart failure (HCC) 07/29/2017   Pericardial effusion 12/24/2015   S/P CABG x 5 11/22/2015   Abnormal stress test 11/21/2015   Coronary artery disease of bypass graft of native heart with stable angina pectoris    Nonspecific abnormal electrocardiogram (ECG) (EKG) 11/07/2015   Essential hypertension 03/30/2011   Hypercholesterolemia 03/30/2011   Colon polyp 03/30/2011    ONSET DATE: 05/17/2024 (referral date)    REFERRING DIAG: G82.50 (ICD-10-CM) - Quadriplegia, unspecified S14.129A (ICD-10-CM) - Central cord syndrome, initial encounter (HCC)    THERAPY DIAG:  Other lack of coordination  Quadriplegia, C1-C4 incomplete (HCC)  Muscle weakness (generalized)  Rationale for Evaluation and Treatment: Rehabilitation  SUBJECTIVE:   SUBJECTIVE STATEMENT: Pt reports that on some days he is able to grasp things in palm more easily, but on other days he cannot.  Pt accompanied by: Jerome (Jerome Vaughn)   PERTINENT HISTORY: Presented 04/15/2024 after being on a roof cutting a tree branch when the tree fell on him pinning him against the roof. Pt underwent ACDF C6-7 on 04/19/24. No loss of consciousness. PMH:  significant for diabetes mellitus, CAD with CABG 2017, hypertension, hyperlipidemia  IMPRESSION from MRI on 04/16/24: 1. No  acute fracture or ligamentous injury of the cervical spine. 2. Focal hyperintense T2-weighted signal within the spinal cord at the C3-4 levels and at C7, which may indicate myelomalacia or spinal cord edema. 3. Severe spinal canal stenosis and bilateral neural foraminal stenosis at C6-7. 4. Mild spinal canal stenosis and severe bilateral neural foraminal stenosis at C3-4. 5. Moderate left C2-3, left C4-5 and bilateral C5-6 neural foraminal stenosis.  PRECAUTIONS: Cervical, Fall, and Other: soft collar, orthostatic hypotension (wears abdominal binder and TED hoses), lifting restrictions   WEIGHT BEARING RESTRICTIONS: ? UE wt bearing  PAIN:  Are you having pain? Yes: NPRS scale: 5/10 Pain location: B shoulders  Pain description: stiffness  Aggravating factors: standing Relieving factors: changing positions, OTC meds and muscle relaxers  FALLS: Has patient fallen in last 6 months? Yes. 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  HAND DOMINANCE: Right  ADLs: Transfers/ambulation related to ADLs: rollator Eating: mod I using Rt hand mostly with adapted utensils, Jerome cuts food Grooming: mod I for brushing teeth/shaving using both hands, except Jerome brushes pt's hair UB Dressing: min assist - improving LB Dressing: max assist for TED hose, fluctuating assist for pants/shoes Toileting: supervision and assist for wiping Bathing: assist w/ lower legs and back  Tub  Shower transfers: min assist Equipment: Shower seat with back and Grab bars  IADLs:  Shopping: pt has gone with Jerome 2x since d/c from hospital Light housekeeping: dependent Meal Prep: dependent Community mobility: dependent Medication management: Jerome places in pillbox, Jerome assists Handwriting: 90% legible in print (pt reports signature was always bad)   MOBILITY STATUS: using SPC   UPPER EXTREMITY ROM:  RUE AROM at shoulder and elbow WFL's. Limited more distally with 75% wrist and 50% composite flexion  LUE shoulder movement limited in approx 45* scaption (lesser true flex), 75% ER, 90% IR, Elbows distally WFLs w/ difficulty w/ index PIP extension   UPPER EXTREMITY MMT:   not tested d/t precautions, current deficits and premorbid rotator cuff tears bilaterally    HAND FUNCTION: Grip strength: Right: 5.2 lbs; Left: 29.3 lbs  COORDINATION: 9 Hole Peg test: Right: placed 6 in 2 min;   Left: 117.83 sec Box and Blocks:  Right 31 blocks, Left 35 blocks  SENSATION: Light touch: WFL Pt reports some numbness in fingertips (premorbid but more pronounced since accident)   EDEMA: mild Rt hand, slightly worse in fingers Rt hand  MUSCLE TONE: pt has reported spasms and on muscle relaxer's (take at night)   COGNITION: Overall cognitive status: Within functional limits for tasks assessed  VISION: Subjective report: Pt reports no changes  Baseline vision: Bifocals Visual history: none    PERCEPTION: Not tested  PRAXIS: Not tested  OBSERVATIONS: bilateral hand deficits (Rt worse than Lt), bilateral shoulder deficits (Lt worse than Rt)  Premorbid rotator cuff tears but pt reports he had majority of ROM both shoulders prior to accident - now limited in ROM Lt shoulder                                                                                                                             TREATMENT DATE: 08/22/24 Discussed with patient upcoming re-cert. Pt reports he feels  satisfied with current ADL completion and feels he hs made some improvements, however reports coordination has good days and bad days. Pt reports hypersensitivity in B arms , educated in desensitization as well as sleep positioning (with instructions to continue to use wedge pillow for cervical mgmt). Discussed with pt and spouse upcomig recert, agreed that they could benefit from continued skilled services to improve FM coordination further  and fit for hand based splint for reduced flexion contracture in L fingers, specifically index finger.  Pt completed light IADL tasks D SPV including putting away cups in cupboard and wiping down countertops.    PATIENT EDUCATION: Education details: SEE ABOVE Person educated: Patient and Spouse Education method: Explanation, Verbal cues, and Handouts Education comprehension: verbalized understanding and returned demonstration  HOME EXERCISE PROGRAM: 07/04/24: yellow theraputty HEP (ACCESS CODE [id number]), coordination 07/28/24: Isolated finger movements (ACCESS CODE [id number]) 08/09/24: splint wear and care 08/22/24: desensitization and sleep positioning    GOALS: Goals reviewed with patient? Yes  SHORT TERM GOALS: Target date: 07/22/24  Pt independent with HEP for bilateral coordination and grip strength Baseline:  Goal status: MET   2.  Pt independent with modified shoulder HEP (premorbid rotator cuff tear)  Baseline:  Goal status: MET   3.  Pt to consistently be mod I for donning/doffing overhead shirts and UB bathing w/ AE prn Baseline:  07/21/24: Pt reports donning/doffing UB short sleeve shirts independently/Mod I, needs Min A for donning long sleeves (adjustments at sleeves only). Pt also educated in and looking to obtain long handled sponge. Goal status: PARTIALLY MET  4.  Pt to perform LE dressing w/ min assist using AE prn (w/ exception to TED hoses)  Baseline: mod to max assist 07/21/24: occasional assistance for pants at this time,  doing his own shoes Goal status: MET  5.  Grip strength to increase by 10 lbs or more on Rt side and 5 lbs or more on Lt side Baseline: 5.2 lbs, 29.3 lbs 07/21/24: 13.0 lbs, 29.1 lbs 11.24.25: 12.7 R , 31.7 lbs L Goal status: IN PROGRESS  6.  Improve BUE function as evidenced by increasing Box & Blocks by 5 or more blocks Baseline: Rt = 31, Lt = 35 07/21/24: R: 36, L: 39 08/22/24: 39 blocks L hand Goal status: PARTIALLY MET (met on Rt, almost met on Lt)     LONG TERM GOALS: Target date: 08/22/24  Independent with updated HEP  Baseline:  Goal status: IN PROGRESS  2.  Pt to be mod I for all BADLS including brushing hair and LB dressing (exception TED hoses)  Baseline:  07/21/24: Pt reports improved grooming and is obtaining long handled comb, pt reports occasional assistance with pants especially when already wearing compression stockings Goal status: IN PROGRESS  3.  Improve Rt hand coordination as evidenced by reducing speed on 9 hole peg test in under 2 minutes Baseline:  07/21/24: 1 minutes, 24 seconds Goal status:  MET  4.  Improve Lt hand coordination as evidenced by reducing speed on 9 hole peg test in under 2 minutes Baseline:  07/21/24: 1 minute, 29 seconds Goal status:  MET  5.  Pt to perform light IADLS in standing with countertop support w/o LOB  Baseline:  Goal status: MET  6.  Pt to improve LUE shoulder ROM to 70* flexion or greater for mid level reaching Baseline:  07/21/24: 80 degrees measured however some scaption noted 08/22/24: 110, scaption noted Goal status: IN PROGRESS  ASSESSMENT:  CLINICAL IMPRESSION: Pt seen this date for tx. Pt in agreement that he would benefit from continued skilled services post this POC to continue to hone coordination and grip strength as well as fabricate splint. Pt would benefit from continued skilled services to address limited gross composite flexion and digital opposition of R hand, fabricate hand based splint for L  hand as needed, and improve functional capability to complete ADL/IADLs  PERFORMANCE DEFICITS: in functional skills including ADLs, IADLs, coordination, dexterity, sensation, edema, ROM, strength, pain, flexibility, Fine motor control, Gross motor control, mobility, body mechanics, cardiopulmonary status limiting function, decreased knowledge of precautions, decreased knowledge of use of DME, and UE functional use.   IMPAIRMENTS: are limiting patient from ADLs, IADLs, rest and sleep, work, leisure, and social participation.   CO-MORBIDITIES: has co-morbidities such as orthostatic hypotension, bilateral rotator cuff tears that affects occupational performance. Patient will benefit from skilled OT to address above impairments and improve overall function.  MODIFICATION OR ASSISTANCE TO COMPLETE EVALUATION: Min-Moderate modification of tasks or assist with assess necessary to complete an evaluation.  OT OCCUPATIONAL PROFILE AND HISTORY: Detailed assessment: Review of records and additional review of physical, cognitive, psychosocial history related to current functional performance.  CLINICAL DECISION MAKING: Moderate - several treatment options, min-mod task modification necessary  REHAB POTENTIAL: Good  EVALUATION COMPLEXITY: Moderate    PLAN:  OT FREQUENCY: 2x/week  OT DURATION: 8 weeks  PLANNED INTERVENTIONS: 97535 self care/ADL training, 02889 therapeutic exercise, 97530 therapeutic activity, 97112 neuromuscular re-education, 97140 manual therapy, 97113 aquatic therapy, 97018 paraffin, 02960 fluidotherapy, 97010 moist heat, 97760 Orthotic Initial, 97763 Orthotic/Prosthetic subsequent, passive range of motion, functional mobility training, patient/family education, and DME and/or AE instructions  RECOMMENDED OTHER SERVICES: none at this time  CONSULTED AND AGREED WITH PLAN OF CARE: Patient and family member/caregiver  PLAN FOR NEXT SESSION:  Check resting hand splint Rt  hand Continue fluidotherapy Coordination and grip strength activities Consider Lt hand based splint w/ index finger in MP flexion and PIP straight w/ strapping across PIP joint Recert?    Rocky Dutch, OT 08/22/2024, 3:02 PM

## 2024-08-23 ENCOUNTER — Encounter: Payer: Self-pay | Admitting: Cardiovascular Disease

## 2024-08-23 ENCOUNTER — Ambulatory Visit: Attending: Cardiovascular Disease | Admitting: Cardiovascular Disease

## 2024-08-23 VITALS — BP 120/78 | HR 80 | Resp 16 | Ht 67.0 in | Wt 181.0 lb

## 2024-08-23 DIAGNOSIS — I25708 Atherosclerosis of coronary artery bypass graft(s), unspecified, with other forms of angina pectoris: Secondary | ICD-10-CM

## 2024-08-23 DIAGNOSIS — I5042 Chronic combined systolic (congestive) and diastolic (congestive) heart failure: Secondary | ICD-10-CM

## 2024-08-23 DIAGNOSIS — N182 Chronic kidney disease, stage 2 (mild): Secondary | ICD-10-CM | POA: Diagnosis not present

## 2024-08-23 DIAGNOSIS — I9589 Other hypotension: Secondary | ICD-10-CM | POA: Diagnosis not present

## 2024-08-23 DIAGNOSIS — E118 Type 2 diabetes mellitus with unspecified complications: Secondary | ICD-10-CM | POA: Diagnosis not present

## 2024-08-23 DIAGNOSIS — D472 Monoclonal gammopathy: Secondary | ICD-10-CM

## 2024-08-23 MED ORDER — MIDODRINE HCL 10 MG PO TABS: ORAL_TABLET | ORAL | Status: DC

## 2024-08-23 NOTE — Patient Instructions (Signed)
 Medication Instructions:  Stop  the evening dose of Midodrine  (just take 2 times a [place]) *If you need a refill on your cardiac medications before your next appointment, please call your pharmacy*  Lab Work: None ordered If you have labs (blood work) drawn today and your tests are completely normal, you will receive your results only by: MyChart Message (if you have MyChart) OR A paper copy in the mail If you have any lab test that is abnormal or we need to change your treatment, we will call you to review the results.  Testing/Procedures: None ordered  Follow-Up: At [facility], you and your health needs are our priority.  As part of our continuing mission to provide you with exceptional heart care, our providers are all part of one team.  This team includes your primary Cardiologist (physician) and Advanced Practice Providers or APPs (Physician Assistants and Nurse Practitioners) who all work together to provide you with the care you need, when you need it.  Your next appointment:   6 month(s)  Provider:   Dr [name]  We recommend signing up for the patient portal called MyChart.  Sign up information is provided on this After Visit Summary.  MyChart is used to connect with patients for Virtual Visits (Telemedicine).  Patients are able to view lab/test results, encounter notes, upcoming appointments, etc.  Non-urgent messages can be sent to your provider as well.   To learn more about what you can do with MyChart, go to [email or web address].

## 2024-08-23 NOTE — Progress Notes (Unsigned)
 Cardiology Office Note:    Date:  08/25/2024   ID:  Jerome Vaughn, DOB [birth year], MRN [id number]  PCP:  Jerome Garnette LABOR, MD   [facility] Providers Cardiologist:  Jerome Balding, MD     Referring MD: Jerome Garnette LABOR, MD   Chief Complaint  Patient presents with   Coronary Artery Disease    History of Present Illness:    Jerome Vaughn is a 73 y.o. male with a hx of coronary artery disease (CABGx4 2017, known occlusion of SVG-Diag, patent LIMA-LAD, SVG-OM1-OM2 with occlusion of the distal limb of the graft, patent SVG-RCA by cath 2020), left ventricular systolic dysfunction (most recent LVEF 35-45% by angiography in 2020), type 2 diabetes mellitus, hypercholesterolemia, essential hypertension, bilateral carpal tunnel syndrome, presenting for routine follow-up.  He had to undergo cervical spine surgery on April 19, 2024 following an accident and development of acute incomplete quadriplegia.  He has had major changes in his hemodynamics since then.  He was no longer taking any of his heart failure medications but is on fludrocortisone  and midodrine  for hypotension.  Today his blood pressure is 120/78 on fludrocortisone  0.1 mg daily and midodrine  10 mg 3 times daily.  The only antianginal medication he is still taking his ranolazine .  He denies recent problems with dizziness and has not experienced syncope.  He denies any shortness of breath or anginal chest pain.  He has not had palpitations.  He does not have orthopnea, PND or lower extremity edema.    His most recent echocardiogram performed 07/18/2024 shows LVEF 35-40% (on my personal review I think the EF is closer to 45% actually).  There was no evidence of elevated left or right filling pressures or any serious valvular abnormalities.  There was mild dilation of the ascending aorta at 41 mm.  Metabolic parameters earlier this year pretty good with a creatinine 0.96, LDL 63, HDL 64, normal triglycerides.  Only his hemoglobin A1c was  out of range at 7.7% (when he was admitted for his cervical spine injury his hemoglobin A1c was much better at 6.4%).  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  His carpal tunnel syndrome symptoms began 15 years ago, suggesting that they are probably not due to amyloidosis.  To date there have been no cardiac manifestations to suggest amyloidosis. His endocrinologist is Jerome Vaughn.  His hematologist is Dr. Federico.   Past Medical History:  Diagnosis Date   Carpal tunnel syndrome    both hands   Colon polyps    Coronary artery disease    sees Dr. Balding   Diabetes mellitus    sees Dr. Stefano Vaughn   Diabetes mellitus [facility]) 2009   Ganglion cyst of wrist, right 1989   GERD (gastroesophageal reflux disease)    History of echocardiogram    a. Echo 4/17: Moderate LVH, EF 50-55%, mild LAE, no pericardial effusion   Hyperlipidemia    Hypertension    MGUS (monoclonal gammopathy of unknown significance)    sees Dr. Norleen Jerome Vaughn   Neuropathy    gets foot exams with Dr. Thresa Vaughn (podiatry)   Pneumonia    Rotator cuff injury     Past Surgical History:  Procedure Laterality Date   ANTERIOR CERVICAL DECOMP/DISCECTOMY FUSION N/A 04/19/2024   Procedure:  ANTERIOR CERVICAL DECOMPRESSION/DISCECTOMY FUSION CERVICAL SIX-SEVEN;  Surgeon: Jerome Dorn MATSU, MD;  Location: [facility] OR;  Service: Neurosurgery;  Laterality: N/A;  ACDF C67   CARDIAC CATHETERIZATION N/A 11/21/2015   Procedure: Left Heart Cath and Coronary Angiography;  Surgeon: Jerome LELON Sharps, MD;  Location: [facility] INVASIVE CV LAB;  Service: Cardiovascular;  Laterality: N/A;   COLONOSCOPY  03/18/2024   per Dr. [name], adenomatous polyp, repeat in 5 yrs   CORONARY ARTERY BYPASS GRAFT N/A 11/22/2015   Procedure:  CORONARY ARTERY BYPASS GRAFTING (CABG) times five using the left internal mammary, right greater saphenous vein EVH, and left thigh greater saphenous vein EVH;  Surgeon: Jerome Fleeta Ochoa, MD;  Location: [facility] OR;  Service: Open Heart Surgery;  Laterality: N/A;   CORONARY ARTERY BYPASS GRAFT  2017   CORONARY PRESSURE/FFR STUDY N/A 12/14/2018   Procedure: INTRAVASCULAR PRESSURE WIRE/FFR STUDY;  Surgeon: Jerome Candyce RAMAN, MD;  Location: [facility] INVASIVE CV LAB;  Service: Cardiovascular;  Laterality: N/A;   ESOPHAGOGASTRODUODENOSCOPY  03/18/2024   per Dr. [name], normal   frozen shoulder release Right    GANGLION CYST EXCISION Right 1989   LEFT HEART CATH AND CORS/GRAFTS ANGIOGRAPHY N/A 10/27/2017   Procedure: LEFT HEART CATH AND CORS/GRAFTS ANGIOGRAPHY;  Surgeon: Vaughn Jerome LELON, MD;  Location: [name];  Service: Cardiovascular;  Laterality: N/A;   LEFT HEART CATH AND CORS/GRAFTS ANGIOGRAPHY N/A 12/14/2018   Procedure: LEFT HEART CATH AND CORS/GRAFTS ANGIOGRAPHY;  Surgeon: Jerome Candyce RAMAN, MD;  Location: [facility] INVASIVE CV LAB;  Service: Cardiovascular;  Laterality: N/A;   TEE WITHOUT CARDIOVERSION N/A 11/22/2015   Procedure: TRANSESOPHAGEAL ECHOCARDIOGRAM (TEE);  Surgeon: Jerome Fleeta Ochoa, MD;  Location: [facility];  Service: Open Heart Surgery;  Laterality: N/A;   WRIST GANGLION EXCISION Right     Current Medications: Current Meds  Medication Sig   ACCU-CHEK GUIDE TEST test strip TEST BLOOD SUGAR IN THE MORNING, AT NOON AND AT BEDTIME   Accu-Chek Softclix Lancets lancets TEST BLOOD SUGAR IN THE MORNING, AT NOON, AND AT BEDTIME AS DIRECTED   acetaminophen  (TYLENOL ) 500 MG tablet Take 500 mg by mouth as needed.   Ascorbic Acid (VITAMIN C PO) Take 1 tablet by mouth daily.   aspirin  EC 81 MG tablet Take 1 tablet (81 mg total) by mouth daily.   atomoxetine  (STRATTERA ) 25 MG capsule Take 2 capsules (50 mg total) by mouth daily.   Baclofen  5 MG TABS Take 1-2 tablets (5-10 mg total) by mouth 3  (three) times daily. Since increasing the dose, increasing the amount- for spasticity- with robaxin  prn   Blood Glucose Monitoring Suppl (ACCU-CHEK GUIDE) w/Device KIT USE AS DIRECTED   Cyanocobalamin  (VITAMIN B-12 PO) Take 1 tablet by mouth daily.   diclofenac  Sodium (VOLTAREN ) 1 % GEL Apply 2 g topically 2 (two) times daily as needed (Shoulder pain).   docusate sodium  (COLACE) 100 MG capsule Take 1 capsule (100 mg total) by mouth 2 (two) times daily.   ezetimibe  (ZETIA ) 10 MG tablet Take 1 tablet (10 mg total) by mouth daily.   ferrous sulfate  325 (65 FE) MG tablet Take 1 tablet (325 mg total) by mouth daily with breakfast.   fludrocortisone  (FLORINEF ) 0.1 MG tablet Take 1 tablet (0.1 mg total) by mouth daily.   gabapentin  (NEURONTIN ) 300 MG capsule Take 1 capsule (300 mg total) by mouth 3 (three) times daily as needed (severe nerve pain- could cause loopiness).   lidocaine  (LIDODERM ) 5 % Place 2 patches onto the skin daily. Remove &  Discard patch within 12 hours or as directed by MD   meloxicam  (MOBIC ) 15 MG tablet Take 15 mg by mouth daily.   metFORMIN  (GLUCOPHAGE -XR) 500 MG 24 hr tablet Take 4 tablets (2,000 mg total) by mouth daily. Take 2 tablets by mouth twice daily   methocarbamol  (ROBAXIN ) 500 MG tablet Take 2 tablets (1,000 mg total) by mouth every 8 (eight) hours as needed for muscle spasms (can take with baclofen - for different reasons- spasms vs spasticity). With baclofen    Multiple Vitamin (MULTIVITAMIN) tablet Take 1 tablet by mouth daily.   nitroGLYCERIN  (NITROSTAT ) 0.4 MG SL tablet Place 1 tablet (0.4 mg total) under the tongue every 5 (five) minutes as needed for chest pain.   Omega-3 Fatty Acids (FISH OIL PO) Take 1 capsule by mouth daily.   pantoprazole  (PROTONIX ) 40 MG tablet Take 1 tablet (40 mg total) by mouth 2 (two) times daily. Please schedule an office visit for further refills. Thank you   polyethylene glycol (MIRALAX  / GLYCOLAX ) 17 g packet Take 17 g by mouth daily.    ranolazine  (RANEXA ) 500 MG 12 hr tablet TAKE 1 TABLET TWICE DAILY   rosuvastatin  (CRESTOR ) 40 MG tablet Take 1 tablet (40 mg total) by mouth daily.   Semaglutide , 2 MG/DOSE, 8 MG/3ML SOPN Inject 2 mg as directed once a week.   sulfamethoxazole -trimethoprim  (BACTRIM  DS) 800-160 MG tablet Take 1 tablet by mouth 2 (two) times daily.   traMADol  (ULTRAM ) 50 MG tablet Take 1 tablet (50 mg total) by mouth every 6 (six) hours as needed for severe pain (pain score 7-10).   VITAMIN E PO Take 1 capsule by mouth daily.   [DISCONTINUED] midodrine  (PROAMATINE ) 10 MG tablet Take 1 tablet (10 mg total) by mouth 3 (three) times daily with meals.     Allergies:   Oxycodone      Family History: The patient's family history includes Anuerysm in his brother; Colon polyps in his sister; Diabetes in his maternal grandmother, sister, sister, and son; Heart disease in his son; Heart failure in his father; Hyperlipidemia in his sister and sister; Hypertension in his sister; Kidney disease in his sister; Kidney failure in his sister; Other in his brother. There is no history of Heart attack, Colon cancer, Stomach cancer, Esophageal cancer, or Pancreatic cancer.  ROS:   Please see the history of present illness.     All other systems reviewed and are negative.  EKGs/Labs/Other Studies Reviewed:    The following studies were reviewed today: Cardiac catheterization 12/14/2018   Ost LM to Dist LM lesion is 65% stenosed. FFR of this lesion into the distal circumflex was negative, 0.95. Prox LAD lesion is 85% stenosed. LIMA to LAD is patent. 1st Diag lesion is 100% stenosed. SVG to diagonal is occluded. Ost 2nd Mrg to 2nd Mrg lesion is 65% stenosed. Jump graft from OM1 to OM2 is occluded. Ost 1st Mrg lesion is 75% stenosed. SVG to OM1 portion is patent. Mid RCA lesion is 100% stenosed. SVG to PDA is patent. Ostial PDA disease is unchanged from prior. Ost RPDA to RPDA lesion is 95% stenosed. The left ventricular  ejection fraction is 35-45% by visual estimate. There is mild to moderate left ventricular systolic dysfunction. LV end diastolic pressure is normal.   Continue medical therapy for small vessel disease and for LV dysfunction.   Diagnostic Dominance: Right    Echocardiogram 07/18/2024  1. Left ventricular ejection fraction, by estimation, is 35 to 40%. Left  ventricular ejection fraction by 3D volume  is 38 %. The left ventricle has  moderately decreased function. The left ventricle demonstrates global  hypokinesis. There is moderate  concentric left ventricular hypertrophy. Left ventricular diastolic  parameters are consistent with Grade I diastolic dysfunction (impaired  relaxation). The average left ventricular global longitudinal strain is  -10.8 %. The global longitudinal strain is  abnormal.   2. Right ventricular systolic function is normal. The right ventricular  size is normal.   3. Left atrial size was moderately dilated.   4. Right atrial size was moderately dilated.   5. The mitral valve is normal in structure. No evidence of mitral valve  regurgitation. No evidence of mitral stenosis.   6. The aortic valve is tricuspid. There is mild calcification of the  aortic valve. There is mild thickening of the aortic valve. Aortic valve  regurgitation is not visualized. Aortic valve sclerosis/calcification is  present, without any evidence of  aortic stenosis.   7. Aortic dilatation noted. There is mild dilatation of the aortic root,  measuring 40 mm. There is mild dilatation of the ascending aorta,  measuring 41 mm.   8. The inferior vena cava is normal in size with greater than 50%  respiratory variability, suggesting right atrial pressure of 3 mmHg.   EKG: Personally reviewed the most recent tracing from 04/24/2024 which shows sinus rhythm with first-degree AV block and left anterior fascicular block.  EKG Interpretation Date/Time:    Ventricular Rate:    PR Interval:     QRS Duration:    QT Interval:    QTC Calculation:   R Axis:      Text Interpretation:           Recent Labs: 03/25/2024: TSH 0.72 04/18/2024: Magnesium  1.8 05/16/2024: ALT 36; BUN 9; Creatinine, Ser 0.96; Hemoglobin 12.1; Platelets 127; Potassium 3.9; Sodium 138  Recent Lipid Panel    Component Value Date/Time   CHOL 141 03/25/2024 0944   CHOL 142 10/26/2019 0903   TRIG 68.0 03/25/2024 0944   HDL 64.20 03/25/2024 0944   HDL 74 10/26/2019 0903   CHOLHDL 2 03/25/2024 0944   VLDL 13.6 03/25/2024 0944   LDLCALC 63 03/25/2024 0944   LDLCALC 56 08/07/2020 0906   LDLDIRECT 43.0 10/10/2022 1041  08/07/2023 hemoglobin A1c 6.4%   Risk Assessment/Calculations:             Physical Exam:    VS:  BP 120/78 (BP Location: Left Arm, Patient Position: Sitting, Cuff Size: Normal)   Pulse 80   Resp 16   Ht 5' 7 (1.702 m)   Wt 181 lb (82.1 kg)   SpO2 97%   BMI 28.35 kg/m     Wt Readings from Last 3 Encounters:  08/23/24 181 lb (82.1 kg)  08/15/24 181 lb (82.1 kg)  07/22/24 178 lb (80.7 kg)      General: Alert, oriented x3, no distress, overweight Head: no evidence of trauma, PERRL, EOMI, no exophtalmos or lid lag, no myxedema, no xanthelasma; normal ears, nose and oropharynx Neck: normal jugular venous pulsations and no hepatojugular reflux; brisk carotid pulses without delay and no carotid bruits Chest: clear to auscultation, no signs of consolidation by percussion or palpation, normal fremitus, symmetrical and full respiratory excursions Cardiovascular: normal position and quality of the apical impulse, regular rhythm, normal first and second heart sounds, no murmurs, rubs or gallops Abdomen: no tenderness or distention, no masses by palpation, no abnormal pulsatility or arterial bruits, normal bowel sounds, no hepatosplenomegaly Extremities: no clubbing, cyanosis or  edema; 2+ radial, ulnar and brachial pulses bilaterally; 2+ right femoral, posterior tibial and dorsalis  pedis pulses; 2+ left femoral, posterior tibial and dorsalis pedis pulses; no subclavian or femoral bruits Neurological: grossly nonfocal Psych: Normal mood and affect    ASSESSMENT:    No diagnosis found.  PLAN:    In order of problems listed above:  Hypotension: Occurred following his cervical spine injury and acute incomplete quadriplegia and he is currently on fludrocortisone  and midodrine .  Will try to gradually wean off these medications.  Start by discontinuing the evening dose of midodrine .  Then we will try to cut the morning and midday doses of midodrine  to 5 mg only.  We will then try to wean the fludrocortisone  slowly by taking it every other [place].  Hopefully after several months we will be able to restart him on heart failure medications.   CAD: Currently without complaints of angina, but also quite inactive.  Continue ranolazine .  He has a several territories downstream of occluded vessels that are not amenable to revascularization which can cause angina pectoris (first diagonal, second oblique marginal, R-PLV territory).  Continue aspirin  and statin. CHF: NYHA functional class I, clinically euvolemic despite the fact that he does not take any diuretics or heart failure medications currently.  On my review of the most recent echocardiogram I think the ejection fraction is about 45%.   LVEF has been consistently estimated to be abnormal, although only mildly depressed by echo and moderately depressed by LV angiography.  HLP: Lipid profile is excellent.  Continue same doses of rosuvastatin  and ezetimibe . DM: Glycemic control is acceptable but not optimal.  Ideally would 1 A1c less than 7% to slow down progression of vascular disease. CKD2: Most recent creatinine is completely normal at 0.96. MGUS:  IgG lambda monoclonal spike.  He has had bilateral carpal tunnel syndrome, but this has been going on for 15 years is probably not from amyloidosis.  He has no other symptoms to suggest  amyloidosis.  No evidence of low voltage on ECG.  Very mild LVH on echo.  Mitral annulus velocities are only mildly decreased.           Medication Adjustments/Labs and Tests Ordered: Current medicines are reviewed at length with the patient today.  Concerns regarding medicines are outlined above.  No orders of the defined types were placed in this encounter.  Meds ordered this encounter  Medications   midodrine  (PROAMATINE ) 10 MG tablet    Sig: Take 10 mg with breakfast and lunch    Dose change, take with breakfast and lunch.    Patient Instructions  Medication Instructions:  Stop  the evening dose of Midodrine  (just take 2 times a [place]) *If you need a refill on your cardiac medications before your next appointment, please call your pharmacy*  Lab Work: None ordered If you have labs (blood work) drawn today and your tests are completely normal, you will receive your results only by: MyChart Message (if you have MyChart) OR A paper copy in the mail If you have any lab test that is abnormal or we need to change your treatment, we will call you to review the results.  Testing/Procedures: None ordered  Follow-Up: At [facility], you and your health needs are our priority.  As part of our continuing mission to provide you with exceptional heart care, our providers are all part of one team.  This team includes your primary Cardiologist (physician) and Advanced Practice Providers or APPs (Physician  Assistants and Nurse Practitioners) who all work together to provide you with the care you need, when you need it.  Your next appointment:   6 month(s)  Provider:   Dr [name]  We recommend signing up for the patient portal called MyChart.  Sign up information is provided on this After Visit Summary.  MyChart is used to connect with patients for Virtual Visits (Telemedicine).  Patients are able to view lab/test results, encounter notes, upcoming appointments, etc.   Non-urgent messages can be sent to your provider as well.   To learn more about what you can do with MyChart, go to [email or web address].      Signed, Jerome [name], MD  08/25/2024 2:46 PM    [facility]

## 2024-08-30 ENCOUNTER — Ambulatory Visit: Admitting: Occupational Therapy

## 2024-08-30 ENCOUNTER — Ambulatory Visit: Admitting: Physical Therapy

## 2024-08-30 VITALS — BP 122/81 | HR 80

## 2024-08-30 DIAGNOSIS — G8252 Quadriplegia, C1-C4 incomplete: Secondary | ICD-10-CM | POA: Insufficient documentation

## 2024-08-30 DIAGNOSIS — R208 Other disturbances of skin sensation: Secondary | ICD-10-CM | POA: Diagnosis present

## 2024-08-30 DIAGNOSIS — R278 Other lack of coordination: Secondary | ICD-10-CM | POA: Diagnosis present

## 2024-08-30 DIAGNOSIS — R2689 Other abnormalities of gait and mobility: Secondary | ICD-10-CM | POA: Insufficient documentation

## 2024-08-30 DIAGNOSIS — R293 Abnormal posture: Secondary | ICD-10-CM | POA: Insufficient documentation

## 2024-08-30 DIAGNOSIS — M62838 Other muscle spasm: Secondary | ICD-10-CM | POA: Diagnosis present

## 2024-08-30 DIAGNOSIS — M6281 Muscle weakness (generalized): Secondary | ICD-10-CM | POA: Diagnosis present

## 2024-08-30 DIAGNOSIS — R29898 Other symptoms and signs involving the musculoskeletal system: Secondary | ICD-10-CM | POA: Diagnosis present

## 2024-08-30 DIAGNOSIS — R29818 Other symptoms and signs involving the nervous system: Secondary | ICD-10-CM | POA: Insufficient documentation
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
## 2024-08-31 ENCOUNTER — Encounter: Payer: Self-pay | Admitting: Family Medicine

## 2024-09-01 ENCOUNTER — Ambulatory Visit

## 2024-09-01 ENCOUNTER — Ambulatory Visit: Admitting: Physical Therapy

## 2024-09-01 VITALS — BP 120/77 | HR 89

## 2024-09-01 DIAGNOSIS — R293 Abnormal posture: Secondary | ICD-10-CM

## 2024-09-01 DIAGNOSIS — G8252 Quadriplegia, C1-C4 incomplete: Secondary | ICD-10-CM

## 2024-09-01 DIAGNOSIS — R278 Other lack of coordination: Secondary | ICD-10-CM

## 2024-09-01 DIAGNOSIS — R208 Other disturbances of skin sensation: Secondary | ICD-10-CM

## 2024-09-01 DIAGNOSIS — M62838 Other muscle spasm: Secondary | ICD-10-CM

## 2024-09-01 DIAGNOSIS — R29898 Other symptoms and signs involving the musculoskeletal system: Secondary | ICD-10-CM

## 2024-09-01 DIAGNOSIS — M6281 Muscle weakness (generalized): Secondary | ICD-10-CM

## 2024-09-01 DIAGNOSIS — R29818 Other symptoms and signs involving the nervous system: Secondary | ICD-10-CM

## 2024-09-01 DIAGNOSIS — R2689 Other abnormalities of gait and mobility: Secondary | ICD-10-CM
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
## 2024-09-01 NOTE — Therapy (Signed)
 OUTPATIENT OCCUPATIONAL THERAPY NEURO TREATMENT/RE-CERT  Patient Name: Jerome Vaughn MRN: [id number] DOB:1950/11/05, 73 y.o., male Today's Date: 09/01/2024  PCP: [name] LABOR, MD REFERRING PROVIDER: [name] PARAS, PA-C   END OF SESSION:  OT End of Session - 09/01/24 1408     Visit Number 14    Authorization Type [employer] MCR Appd 8 OT visits 08/30/24-11/28/24    Authorization Time Period 08/30/24    Authorization - Visit Number 1    Authorization - Number of Visits 8    OT Start Time 1407    OT Stop Time 1445    OT Time Calculation (min) 38 min    Activity Tolerance Patient tolerated treatment well              Past Medical History:  Diagnosis Date   Carpal tunnel syndrome    both hands   Colon polyps    Coronary artery disease    sees Dr. [name]   Diabetes mellitus    sees Dr. [name]   Diabetes mellitus [facility]) 2009   Ganglion cyst of wrist, right 1989   GERD (gastroesophageal reflux disease)    History of echocardiogram    a. Echo 4/17: Moderate LVH, EF 50-55%, mild LAE, no pericardial effusion   Hyperlipidemia    Hypertension    MGUS (monoclonal gammopathy of unknown significance)    sees Dr. [name]   Neuropathy    gets foot exams with Dr. [name] (podiatry)   Pneumonia    Rotator cuff injury    Past Surgical History:  Procedure Laterality Date   ANTERIOR CERVICAL DECOMP/DISCECTOMY FUSION N/A 04/19/2024   Procedure: ANTERIOR CERVICAL DECOMPRESSION/DISCECTOMY FUSION CERVICAL SIX-SEVEN;  Surgeon: [name] MATSU, MD;  Location: [facility] OR;  Service: Neurosurgery;  Laterality: N/A;  ACDF C67   CARDIAC CATHETERIZATION N/A 11/21/2015   Procedure: Left Heart Cath and Coronary Angiography;  Surgeon: [name], MD;  Location: [facility] INVASIVE CV LAB;  Service: Cardiovascular;  Laterality: N/A;   COLONOSCOPY  03/18/2024   per Dr. [name], adenomatous polyp, repeat in 5 yrs   CORONARY ARTERY BYPASS GRAFT N/A 11/22/2015   Procedure: CORONARY  ARTERY BYPASS GRAFTING (CABG) times five using the left internal mammary, right greater saphenous vein EVH, and left thigh greater saphenous vein EVH;  Surgeon: [name], MD;  Location: [facility] OR;  Service: Open Heart Surgery;  Laterality: N/A;   CORONARY ARTERY BYPASS GRAFT  2017   CORONARY PRESSURE/FFR STUDY N/A 12/14/2018   Procedure: INTRAVASCULAR PRESSURE WIRE/FFR STUDY;  Surgeon: [name], MD;  Location: [facility] INVASIVE CV LAB;  Service: Cardiovascular;  Laterality: N/A;   ESOPHAGOGASTRODUODENOSCOPY  03/18/2024   per Dr. [name], normal   frozen shoulder release Right    GANGLION CYST EXCISION Right 1989   LEFT HEART CATH AND CORS/GRAFTS ANGIOGRAPHY N/A 10/27/2017   Procedure: LEFT HEART CATH AND CORS/GRAFTS ANGIOGRAPHY;  Surgeon: Sharps [name], MD;  Location: [name];  Service: Cardiovascular;  Laterality: N/A;   LEFT HEART CATH AND CORS/GRAFTS ANGIOGRAPHY N/A 12/14/2018   Procedure: LEFT HEART CATH AND CORS/GRAFTS ANGIOGRAPHY;  Surgeon: [name], MD;  Location: [facility] INVASIVE CV LAB;  Service: Cardiovascular;  Laterality: N/A;   TEE WITHOUT CARDIOVERSION N/A 11/22/2015   Procedure: TRANSESOPHAGEAL ECHOCARDIOGRAM (TEE);  Surgeon: [name], MD;  Location: [facility] OR;  Service: Open Heart Surgery;  Laterality: N/A;   WRIST GANGLION EXCISION Right    Patient Active Problem List   Diagnosis Date Noted  Fall 05/27/2024   Orthostatic hypotension 05/27/2024   Post-traumatic spasticity 05/27/2024   Coping style affecting medical condition 05/04/2024   Central cord syndrome (HCC) 04/21/2024   Acute incomplete quadriplegia (HCC) 04/21/2024   Shock (HCC) 04/15/2024   Varicose veins of both lower extremities 07/03/2023   Full thickness rotator cuff tear 02/26/2023   Type 2 diabetes mellitus with diabetic polyneuropathy, without long-term current use of insulin  (HCC) 02/02/2023   Pancytopenia (HCC) 01/09/2023   OA (osteoarthritis) of knee 04/18/2022    Left shoulder pain 04/18/2022   GERD (gastroesophageal reflux disease)    Bacterial infection due to H. pylori 03/01/2020   Monoclonal gammopathy of unknown significance (MGUS) 03/01/2020   Iron  deficiency anemia 02/22/2020   Onychomycosis 02/08/2020   Rectal bleeding 01/24/2020   Indigestion 01/24/2020   Angina pectoris 12/12/2018   Erectile disorder due to medical condition in male 04/08/2018   Chronic combined systolic (congestive) and diastolic (congestive) heart failure (HCC) 07/29/2017   Pericardial effusion 12/24/2015   S/P CABG x 5 11/22/2015   Abnormal stress test 11/21/2015   Coronary artery disease of bypass graft of native heart with stable angina pectoris    Nonspecific abnormal electrocardiogram (ECG) (EKG) 11/07/2015   Essential hypertension 03/30/2011   Hypercholesterolemia 03/30/2011   Colon polyp 03/30/2011    ONSET DATE: 05/17/2024 (referral date)    REFERRING DIAG: G82.50 (ICD-10-CM) - Quadriplegia, unspecified S14.129A (ICD-10-CM) - Central cord syndrome, initial encounter (HCC)    THERAPY DIAG:  Other symptoms and signs involving the musculoskeletal system  Other lack of coordination  Muscle weakness (generalized)  Quadriplegia, C1-C4 incomplete (HCC)  Other symptoms and signs involving the nervous system  Other disturbances of skin sensation  Rationale for Evaluation and Treatment: Rehabilitation  SUBJECTIVE:   SUBJECTIVE STATEMENT: Pt reports no pain I'm doing things with the hand, some days are better than others.  Pt accompanied by: [name]   PERTINENT HISTORY: Presented 04/15/2024 after being on a roof cutting a tree branch when the tree fell on him pinning him against the roof. Pt underwent ACDF C6-7 on 04/19/24. No loss of consciousness. PMH:  significant for diabetes mellitus, CAD with CABG 2017, hypertension, hyperlipidemia  IMPRESSION from MRI on 04/16/24: 1. No acute fracture or ligamentous injury of the cervical spine. 2.  Focal hyperintense T2-weighted signal within the spinal cord at the C3-4 levels and at C7, which may indicate myelomalacia or spinal cord edema. 3. Severe spinal canal stenosis and bilateral neural foraminal stenosis at C6-7. 4. Mild spinal canal stenosis and severe bilateral neural foraminal stenosis at C3-4. 5. Moderate left C2-3, left C4-5 and bilateral C5-6 neural foraminal stenosis.  PRECAUTIONS: Cervical, Fall, and Other: soft collar, orthostatic hypotension (wears abdominal binder and TED hoses), lifting restrictions   WEIGHT BEARING RESTRICTIONS: ? UE wt bearing  PAIN:  Are you having pain? No  FALLS: Has patient fallen in last 6 months? Yes. 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  HAND DOMINANCE: Right  ADLs: Transfers/ambulation related to ADLs: rollator Eating:  mod I using Rt hand mostly with adapted utensils, wife cuts food Grooming: mod I for brushing teeth/shaving using both hands, except wife brushes pt's hair UB Dressing: min assist - improving LB Dressing: max assist for TED hose, fluctuating assist for pants/shoes Toileting: supervision and assist for wiping Bathing: assist w/ lower legs and back  Tub Shower transfers: min assist Equipment: Shower seat with back and Grab bars  IADLs:  Shopping: pt has gone with wife 2x since d/c from hospital Light housekeeping: dependent Meal Prep: dependent Community mobility:  dependent Medication management: wife places in pillbox, wife assists Handwriting: 90% legible in print (pt reports signature was always bad)   MOBILITY STATUS: using SPC   UPPER EXTREMITY ROM:  RUE AROM at shoulder and elbow WFL's. Limited more distally with 75% wrist and 50% composite flexion  LUE shoulder movement limited in approx 45* scaption (lesser true flex), 75% ER, 90% IR, Elbows distally WFLs w/ difficulty w/ index PIP extension   UPPER EXTREMITY MMT:   not tested d/t precautions, current deficits and premorbid rotator cuff tears bilaterally    HAND FUNCTION: Grip strength: Right: 5.2 lbs; Left: 29.3 lbs  COORDINATION: 9 Hole Peg test: Right: placed 6 in 2 min;   Left: 117.83 sec Box and Blocks:  Right 31 blocks, Left 35 blocks  SENSATION: Light touch: WFL Pt reports some numbness in fingertips (premorbid but more pronounced since accident)   EDEMA: mild Rt hand, slightly worse in fingers Rt hand  MUSCLE TONE: pt has reported spasms and on muscle relaxer's (take at night)   COGNITION: Overall cognitive status: Within functional limits for tasks assessed  VISION: Subjective report: Pt reports no changes  Baseline vision: Bifocals Visual history: none    PERCEPTION: Not tested  PRAXIS: Not tested  OBSERVATIONS: bilateral hand deficits (Rt worse than Lt), bilateral shoulder deficits (Lt worse than Rt)  Premorbid rotator cuff tears but pt reports he had majority of ROM both shoulders prior to accident - now limited in ROM Lt shoulder                                                                                                                             TREATMENT DATE: 09/01/24  Pt placed BUE in Fluidotherapy machine with supervised ROM x 8 min. Pt was educated to complete tendon glides and active B hand ROM during modality time to improve ROM and decrease pain/stiffness of affected extremity by use of the machine's massaging action and thermal properties.       Discussed approval of future visits with pt and assisted with scheduling for future visits.   Pt engaged in therapeutic activity honing grip strength. With resistance clamp at 15# resistance removed pegs from peg board. Next completed 20 reps of gross grasp exercise with 3#, 5#, and 7# resistance Digiflex. Pt reported some pain in shoulder with this, noted increased ulnar deviation in R hand when completing. Pt cued to place forearm on  table for more support, pt reported this felt better. Provided pt with red theraputty and encouraged to attempt some of his putty exercises with this.  PATIENT EDUCATION: Education details: SEE ABOVE Person educated: Patient and Spouse Education method: Explanation, Verbal cues, and Handouts Education comprehension: verbalized understanding and returned demonstration  HOME EXERCISE PROGRAM: 07/04/24: yellow theraputty HEP (ACCESS CODE [id number]), coordination 07/28/24: Isolated finger movements (ACCESS CODE [id number]) 08/09/24: splint wear and care 08/22/24: desensitization and sleep positioning    GOALS: Goals reviewed with patient? Yes  SHORT TERM GOALS: Target date: 07/22/24  Pt independent with HEP for bilateral coordination and grip strength Baseline:  Goal status: MET   2.  Pt independent with modified shoulder HEP (premorbid rotator cuff tear)  Baseline:  Goal status: MET   3.  Pt to consistently be mod I for donning/doffing overhead shirts and UB bathing w/ AE prn Baseline:  07/21/24: Pt reports donning/doffing UB short sleeve shirts independently/Mod I, needs Min A for donning long sleeves (adjustments at sleeves only). Pt also educated in and looking to obtain long handled sponge. Goal status: PARTIALLY MET  4.  Pt to perform LE dressing w/ min assist using AE prn (w/ exception to TED hoses)  Baseline: mod to max assist 07/21/24: occasional assistance for pants at this time, doing his own shoes Goal status: MET  5.  Grip strength to  increase by 10 lbs or more on Rt side and 5 lbs or more on Lt side Baseline: 5.2 lbs, 29.3 lbs 07/21/24: 13.0 lbs, 29.1 lbs 11.24.25: 12.7 R , 31.7 lbs L Goal status: IN PROGRESS  6.  Improve BUE function as evidenced by increasing Box & Blocks by 5 or more blocks Baseline: Rt = 31, Lt = 35 07/21/24: R: 36, L: 39 08/22/24: 39 blocks L hand Goal status: PARTIALLY MET (met on Rt, almost met on Lt)     LONG TERM GOALS: Target date: 08/22/24  Independent with updated HEP  Baseline:  Goal status: IN PROGRESS  2.  Pt to be mod I for all BADLS including brushing hair and LB dressing (exception TED hoses)  Baseline:  07/21/24: Pt reports improved grooming and is obtaining long handled comb, pt reports occasional assistance with pants especially when already wearing compression stockings Goal status: IN PROGRESS  3.  Improve Rt hand coordination as evidenced by reducing speed on 9 hole peg test in under 2 minutes Baseline:  07/21/24: 1 minutes, 24 seconds Goal status:  MET  4.  Improve Lt hand coordination as evidenced by reducing speed on 9 hole peg test in under 2 minutes Baseline:  07/21/24: 1 minute, 29 seconds Goal status:  MET  5.  Pt to perform light IADLS in standing with countertop support w/o LOB  Baseline:  Goal status: MET  6.  Pt to improve LUE shoulder ROM to 70* flexion or greater for mid level reaching Baseline:  07/21/24: 80 degrees measured however some scaption noted 08/22/24: 110, scaption noted Goal status: IN PROGRESS     NEW SHORT TERM GOALS: Target date: 09/30/24 Grip strength to increase by 10 lbs or more on Rt side and 5 lbs or more on Lt side Baseline: 5.2 lbs, 29.3 lbs 07/21/24: 13.0 lbs, 29.1 lbs 11.24.25: 12.7 R , 31.7 lbs L Goal status: IN PROGRESS  2.  Improve BUE function as evidenced by increasing Box & Blocks by 5 or more blocks Baseline: Rt = 31, Lt = 35 07/21/24: R: 36, L: 39 08/22/24: 39 blocks L  hand Goal status: PARTIALLY MET  (met on Rt, almost met on Lt)     NEW LONG TERM GOALS: Target date:11/28/24  Independent with updated HEP  Baseline:  Goal status: IN PROGRESS  2.  Pt to be mod I for all BADLS including brushing hair and LB dressing (exception TED hoses)  Baseline:  08/22/24: Pt reports improved grooming and is obtaining long handled comb, pt reports occasional assistance with pants especially when already wearing compression stockings Goal status: IN PROGRESS   ASSESSMENT:  CLINICAL IMPRESSION: This 1st progress note/re-cert is for dates: 06/22/2024 to 09/01/2024. Pt has met 3/6 STGs and 4/6 LTGs. Pt making progress towards goals as expected and continues to benefit from skilled OT services in the outpatient setting to work towards remaining goals or until max rehab potential is met.  Pt has been approved for 8 visits from 08/30/25-11/28/24.  PERFORMANCE DEFICITS: in functional skills including ADLs, IADLs, coordination, dexterity, sensation, edema, ROM, strength, pain, flexibility, Fine motor control, Gross motor control, mobility, body mechanics, cardiopulmonary status limiting function, decreased knowledge of precautions, decreased knowledge of use of DME, and UE functional use.   IMPAIRMENTS: are limiting patient from ADLs, IADLs, rest and sleep, work, leisure, and social participation.   CO-MORBIDITIES: has co-morbidities such as orthostatic hypotension, bilateral rotator cuff tears that affects occupational performance. Patient will benefit from skilled OT to address above impairments and improve overall function.  MODIFICATION OR ASSISTANCE TO COMPLETE EVALUATION: Min-Moderate modification of tasks or assist with assess necessary to complete an evaluation.  OT OCCUPATIONAL PROFILE AND HISTORY: Detailed assessment: Review of records and additional review of physical, cognitive, psychosocial history related to current functional performance.  CLINICAL DECISION MAKING: Moderate - several treatment  options, min-mod task modification necessary  REHAB POTENTIAL: Good  EVALUATION COMPLEXITY: Moderate    PLAN:  OT FREQUENCY: 1x/week  OT DURATION: 8 weeks  PLANNED INTERVENTIONS: 97535 self care/ADL training, 02889 therapeutic exercise, 97530 therapeutic activity, 97112 neuromuscular re-education, 97140 manual therapy, 97113 aquatic therapy, 97018 paraffin, 02960 fluidotherapy, 97010 moist heat, 97760 Orthotic Initial, 97763 Orthotic/Prosthetic subsequent, passive range of motion, functional mobility training, patient/family education, and DME and/or AE instructions  RECOMMENDED OTHER SERVICES: none at this time  CONSULTED AND AGREED WITH PLAN OF CARE: Patient and family member/caregiver  PLAN FOR NEXT SESSION:  F/u attempting putty exercises with red putty Continue fluido Progress strength and coordination activities     [employer], OT 09/01/2024, 3:19 PM

## 2024-09-06 ENCOUNTER — Other Ambulatory Visit (HOSPITAL_COMMUNITY): Payer: Self-pay

## 2024-09-08 ENCOUNTER — Encounter: Payer: Self-pay | Admitting: Physical Therapy

## 2024-09-08 ENCOUNTER — Other Ambulatory Visit: Payer: Self-pay | Admitting: Family Medicine

## 2024-09-08 ENCOUNTER — Ambulatory Visit: Admitting: Physical Therapy

## 2024-09-08 DIAGNOSIS — R29818 Other symptoms and signs involving the nervous system: Secondary | ICD-10-CM

## 2024-09-08 DIAGNOSIS — R278 Other lack of coordination: Secondary | ICD-10-CM

## 2024-09-08 DIAGNOSIS — R208 Other disturbances of skin sensation: Secondary | ICD-10-CM

## 2024-09-08 DIAGNOSIS — M6281 Muscle weakness (generalized): Secondary | ICD-10-CM

## 2024-09-08 DIAGNOSIS — R29898 Other symptoms and signs involving the musculoskeletal system: Secondary | ICD-10-CM

## 2024-09-08 DIAGNOSIS — M62838 Other muscle spasm: Secondary | ICD-10-CM

## 2024-09-08 DIAGNOSIS — R2689 Other abnormalities of gait and mobility: Secondary | ICD-10-CM

## 2024-09-08 DIAGNOSIS — R293 Abnormal posture: Secondary | ICD-10-CM
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
## 2024-09-09 ENCOUNTER — Telehealth: Payer: Self-pay | Admitting: Family Medicine

## 2024-09-09 ENCOUNTER — Encounter: Attending: Physical Medicine and Rehabilitation | Admitting: Physical Medicine and Rehabilitation

## 2024-09-09 ENCOUNTER — Encounter: Payer: Self-pay | Admitting: Physical Medicine and Rehabilitation

## 2024-09-09 VITALS — BP 146/94 | HR 77 | Ht 67.0 in | Wt 186.0 lb

## 2024-09-09 DIAGNOSIS — R252 Cramp and spasm: Secondary | ICD-10-CM | POA: Diagnosis present

## 2024-09-09 DIAGNOSIS — G8929 Other chronic pain: Secondary | ICD-10-CM

## 2024-09-09 DIAGNOSIS — M25512 Pain in left shoulder: Secondary | ICD-10-CM | POA: Diagnosis not present

## 2024-09-09 DIAGNOSIS — M792 Neuralgia and neuritis, unspecified: Secondary | ICD-10-CM | POA: Diagnosis present

## 2024-09-09 DIAGNOSIS — G8250 Quadriplegia, unspecified: Secondary | ICD-10-CM

## 2024-09-09 DIAGNOSIS — S14129D Central cord syndrome at unspecified level of cervical spinal cord, subsequent encounter: Secondary | ICD-10-CM | POA: Diagnosis not present

## 2024-09-09 DIAGNOSIS — I951 Orthostatic hypotension: Secondary | ICD-10-CM | POA: Insufficient documentation

## 2024-09-09 NOTE — Telephone Encounter (Signed)
 Copied from CRM #[id number]. Topic: Clinical - Request for Lab/Test Order >> Sep 09, 2024 11:56 AM Jerome Vaughn wrote: Reason for CRM: Patient requests orders for a follow up urine orders to be put in to schedule.
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
## 2024-09-13 ENCOUNTER — Telehealth: Payer: Self-pay | Admitting: *Deleted

## 2024-09-13 ENCOUNTER — Ambulatory Visit: Admitting: Physical Therapy

## 2024-09-13 VITALS — BP 108/69 | HR 88

## 2024-09-13 DIAGNOSIS — R278 Other lack of coordination: Secondary | ICD-10-CM | POA: Diagnosis not present

## 2024-09-13 DIAGNOSIS — R293 Abnormal posture: Secondary | ICD-10-CM

## 2024-09-13 DIAGNOSIS — R29898 Other symptoms and signs involving the musculoskeletal system: Secondary | ICD-10-CM

## 2024-09-13 DIAGNOSIS — G8252 Quadriplegia, C1-C4 incomplete: Secondary | ICD-10-CM

## 2024-09-13 DIAGNOSIS — R2689 Other abnormalities of gait and mobility: Secondary | ICD-10-CM

## 2024-09-13 DIAGNOSIS — R29818 Other symptoms and signs involving the nervous system: Secondary | ICD-10-CM

## 2024-09-13 DIAGNOSIS — M6281 Muscle weakness (generalized): Secondary | ICD-10-CM

## 2024-09-13 NOTE — Telephone Encounter (Signed)
Left pt a message advised to call the office back regarding this message

## 2024-09-13 NOTE — Therapy (Signed)
 OUTPATIENT PHYSICAL THERAPY NEURO TREATMENT   Patient Name: Jerome Vaughn MRN: [id number] DOB:10-Aug-1951, 73 y.o., male Today's Date: 09/13/2024   PCP: [name] LABOR, MD REFERRING PROVIDER: [name] PARAS, PA-C   END OF SESSION:    PT End of Session - 09/13/24 1318     Visit Number 17    Number of Visits 23   recert   Date for Recertification  11/15/24   recert, to allow for scheduling delays   Authorization Type [employer]    Progress Note Due on Visit 10    PT Start Time 1315    PT Stop Time 1400    PT Time Calculation (min) 45 min    Equipment Utilized During Treatment Gait belt    Activity Tolerance Patient tolerated treatment well    Behavior During Therapy WFL for tasks assessed/performed                    Past Medical History:  Diagnosis Date   Carpal tunnel syndrome    both hands   Colon polyps    Coronary artery disease    sees Dr. [name]   Diabetes mellitus    sees Dr. [name]   Diabetes mellitus [facility]) 2009   Ganglion cyst of wrist, right 1989   GERD (gastroesophageal reflux disease)    History of echocardiogram    a. Echo 4/17: Moderate LVH, EF 50-55%, mild LAE, no pericardial effusion   Hyperlipidemia    Hypertension    MGUS (monoclonal gammopathy of unknown significance)    sees Dr. [name]   Neuropathy    gets foot exams with Dr. [name] (podiatry)   Pneumonia    Rotator cuff injury    Past Surgical History:  Procedure Laterality Date   ANTERIOR CERVICAL DECOMP/DISCECTOMY FUSION N/A 04/19/2024   Procedure: ANTERIOR CERVICAL DECOMPRESSION/DISCECTOMY FUSION CERVICAL SIX-SEVEN;  Surgeon: [name] MATSU, MD;  Location: [facility] OR;  Service: Neurosurgery;  Laterality: N/A;  ACDF C67   CARDIAC CATHETERIZATION N/A 11/21/2015   Procedure: Left Heart Cath and Coronary Angiography;  Surgeon: [name], MD;  Location: [facility] INVASIVE CV LAB;  Service: Cardiovascular;  Laterality: N/A;   COLONOSCOPY  03/18/2024   per  Dr. [name], adenomatous polyp, repeat in 5 yrs   CORONARY ARTERY BYPASS GRAFT N/A 11/22/2015   Procedure: CORONARY ARTERY BYPASS GRAFTING (CABG) times five using the left internal mammary, right greater saphenous vein EVH, and left thigh greater saphenous vein EVH;  Surgeon: [name], MD;  Location: [facility] OR;  Service: Open Heart Surgery;  Laterality: N/A;   CORONARY ARTERY BYPASS GRAFT  2017   CORONARY PRESSURE/FFR STUDY N/A 12/14/2018   Procedure: INTRAVASCULAR PRESSURE WIRE/FFR STUDY;  Surgeon: [name], MD;  Location: [facility] INVASIVE CV LAB;  Service: Cardiovascular;  Laterality: N/A;   ESOPHAGOGASTRODUODENOSCOPY  03/18/2024   per Dr. [name], normal   frozen shoulder release Right    GANGLION CYST EXCISION Right 1989   LEFT HEART CATH AND CORS/GRAFTS ANGIOGRAPHY N/A 10/27/2017   Procedure: LEFT HEART CATH AND CORS/GRAFTS ANGIOGRAPHY;  Surgeon: Sharps [name], MD;  Location: [name];  Service: Cardiovascular;  Laterality: N/A;   LEFT HEART CATH AND CORS/GRAFTS ANGIOGRAPHY N/A 12/14/2018   Procedure: LEFT HEART CATH AND CORS/GRAFTS ANGIOGRAPHY;  Surgeon: [name], MD;  Location: [facility] INVASIVE CV LAB;  Service: Cardiovascular;  Laterality: N/A;   TEE WITHOUT CARDIOVERSION N/A 11/22/2015   Procedure: TRANSESOPHAGEAL ECHOCARDIOGRAM (TEE);  Surgeon: Maude  Fleeta Ochoa, MD;  Location: [facility] OR;  Service: Open Heart Surgery;  Laterality: N/A;   WRIST GANGLION EXCISION Right    Patient Active Problem List   Diagnosis Date Noted   Neuropathic pain 09/09/2024   Fall 05/27/2024   Orthostatic hypotension 05/27/2024   Post-traumatic spasticity 05/27/2024   Coping style affecting medical condition 05/04/2024   Central cord syndrome (HCC) 04/21/2024   Acute incomplete quadriplegia (HCC) 04/21/2024   Shock (HCC) 04/15/2024   Varicose veins of both lower extremities 07/03/2023   Full thickness rotator cuff tear 02/26/2023   Type 2 diabetes mellitus with diabetic  polyneuropathy, without long-term current use of insulin  (HCC) 02/02/2023   Pancytopenia (HCC) 01/09/2023   OA (osteoarthritis) of knee 04/18/2022   Left shoulder pain 04/18/2022   GERD (gastroesophageal reflux disease)    Bacterial infection due to H. pylori 03/01/2020   Monoclonal gammopathy of unknown significance (MGUS) 03/01/2020   Iron  deficiency anemia 02/22/2020   Onychomycosis 02/08/2020   Rectal bleeding 01/24/2020   Indigestion 01/24/2020   Angina pectoris 12/12/2018   Erectile disorder due to medical condition in male 04/08/2018   Chronic combined systolic (congestive) and diastolic (congestive) heart failure (HCC) 07/29/2017   Pericardial effusion 12/24/2015   S/P CABG x 5 11/22/2015   Abnormal stress test 11/21/2015   Coronary artery disease of bypass graft of native heart with stable angina pectoris    Nonspecific abnormal electrocardiogram (ECG) (EKG) 11/07/2015   Essential hypertension 03/30/2011   Hypercholesterolemia 03/30/2011   Colon polyp 03/30/2011    ONSET DATE: 04/15/2024 (date of injury and hospital admission)  REFERRING DIAG: G82.50 (ICD-10-CM) - Quadriplegia, unspecified  THERAPY DIAG:  Other symptoms and signs involving the musculoskeletal system  Other lack of coordination  Muscle weakness (generalized)  Other symptoms and signs involving the nervous system  Other abnormalities of gait and mobility  Abnormal posture  Quadriplegia, C1-C4 incomplete (HCC)  Rationale for Evaluation and Treatment: Rehabilitation  SUBJECTIVE:                                                                                                                                                                                             SUBJECTIVE STATEMENT:  Pt reports he had a good trip to [place] for the wedding, everything went well with their travel and he was even able to navigate some steep stairs up/down.  No falls, no dizziness, Dr. Lovorn reduced some BP  medications; pt still wearing his compression stockings, forgot his abdominal binder today.  Pt accompanied by: significant other - Wife [name]  PERTINENT HISTORY: DM, mild neurogenic B/B, CAD/CABG 2017, severe orthostatic hypotension, combined  CHF, bilateral torn RTC, s/p C6-7 ACDF including discectomy for decompression of spinal cord and exiting nerve roots with foraminotomies 04/19/2024 per Dr. 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 Yes: NPRS scale: 8/10 Pain location: L hand Pain description: numbness/tingling Aggravating factors: random, weather Relieving factors: unsure  PRECAUTIONS: Cervical, Fall, and Other: pt reports he is to wear soft cervical collar as needed - he prefers it for comfort currently; last available order states OOB AAT - will clarify w/ Dr. Cornelio - deferred to surgeon  RED FLAGS: Bowel or bladder incontinence: Yes: neurogenic B/B   WEIGHT BEARING RESTRICTIONS: No  FALLS: Has patient fallen in last 6 months? Yes. Number of falls 2 - see eval subjective  LIVING ENVIRONMENT: Lives with: lives with their spouse and adult grandchild Lives in: House/apartment Stairs: Yes: External: 3 steps; can reach both Has following equipment at home: Walker - 4 wheeled, shower chair, bed side commode, Grab bars, and transport  PLOF: Needs assistance with ADLs, Needs assistance with homemaking, Needs assistance with gait, and Needs assistance with transfers  PATIENT GOALS: to work on pain and walking  OBJECTIVE:  Note: Objective measures were completed at Evaluation unless otherwise noted.  DIAGNOSTIC FINDINGS:  Cervical MRI 04/16/2024: IMPRESSION: 1. No acute fracture or ligamentous injury of the cervical spine. 2. Focal hyperintense T2-weighted signal within the spinal cord at the C3-4 levels and at C7, which may indicate myelomalacia or  spinal cord edema. 3. Severe spinal canal stenosis and bilateral neural foraminal stenosis at C6-7. 4. Mild spinal canal stenosis and severe bilateral neural foraminal stenosis at C3-4. 5. Moderate left C2-3, left C4-5 and bilateral C5-6 neural foraminal stenosis.  COGNITION: Overall cognitive status: Within functional limits for tasks assessed   SENSATION: Light touch: impaired knee to ankle bilaterally  COORDINATION: BLE RAMS:  WNL Bilateral Heel-to-shin:  limited by functional weakness of hips  EDEMA:  Bilateral LE mild non-pitting edema at baseline prior to incident - wears TED hose for orthostatic management.  MUSCLE TONE: LLE: Mild, Hypertonic, and Modifed Ashworth Scale 1+ = Slight increase in muscle tone, manifested by a catch, followed by minimal resistance throughout the remainder (less than half) of the ROM  POSTURE: posterior pelvic tilt and upper trunk rigidity - limited assessment by abdominal binder and soft cervical collar  LOWER EXTREMITY ROM:     Active  Right Eval Left Eval  Hip flexion Grossly WNL  Hip extension   Hip abduction   Hip adduction   Hip internal rotation   Hip external rotation   Knee flexion   Knee extension   Ankle dorsiflexion   Ankle plantarflexion    Ankle inversion    Ankle eversion     (Blank rows = not tested)  LOWER EXTREMITY MMT:    MMT Right Eval Left Eval  Hip flexion 4 4  Hip extension    Hip abduction    Hip adduction    Hip internal rotation    Hip external rotation    Knee flexion    Knee extension 4+ 4+  Ankle dorsiflexion 4- 4-  Ankle plantarflexion    Ankle inversion    Ankle eversion    (Blank rows = not tested)  BED MOBILITY:  Findings: Sit to supine Modified independence Supine to sit Modified independence Rolling to Right Modified independence Rolling to Left Modified independence Pt reports using  insertable bed rails for independence.  TRANSFERS: Sit to stand: SBA  Assistive device  utilized: Environmental Consultant - 4 wheeled     Stand to sit: SBA  Assistive device utilized: Environmental Consultant - 4 wheeled     Chair to chair: SBA  Assistive device utilized: Environmental Consultant - 4 wheeled       RAMP:  Not tested  CURB:  Not tested  STAIRS: Not tested GAIT: Findings: Gait Characteristics: step through pattern, decreased stride length, decreased hip/knee flexion- Right, decreased hip/knee flexion- Left, decreased trunk rotation, trunk flexed, and narrow BOS, Distance walked: various clinic distances, Assistive device utilized:Walker - 4 wheeled, Level of assistance: SBA, and Comments: No overt LOB, crossover stepping, ataxia or marked pathway deviation.  Slow pace.  FUNCTIONAL TESTS:  5 times sit to stand: 23.03 sec intermittent hand support on rollator, pt denies dizziness or lightheadedness Timed up and go (TUG): TBA 2 minute walk test: TBA Berg Balance Scale:  TBA    PATIENT SURVEYS:  None completed due to time.                                                                                                                              TREATMENT DATE: 09/13/2024   Self-Care BP assessed in LUE in siting and in standing. Pt's BP does have a drop with transition to standing but he has no dizziness or lightheadedness. No other complaints of dizziness or lightheadedness during session. Vitals:   09/13/24 1325 09/13/24 1328  BP: 119/80 108/69  Pulse: 78 88   TherAct To work on dynamic balance and stepping strategy: Resisted gait with black band x 115 ft Added in multidirectional perturbations x 230 ft Resisted gumdrop taps with black band around hips 2 x 30 reps to fatigue More difficulty tapping with RLE due to knee pain, one LOB due to R knee giving out and needs min A to catch himself to recover To work on balance with initial sit to stand with decreased UE support: Sit to stand with front half of feet on blue wedge x 10 reps (hands on thighs) Added in holding onto 6# medicine ball with chest  press 2 x 10 reps Mild back pain with this, cued to activate core musculature   PATIENT EDUCATION: Education details: continue HEP Person educated: Patient and Spouse Education method: Medical Illustrator Education comprehension: verbalized understanding, returned demonstration, and needs further education  HOME EXERCISE PROGRAM: Access Code: [email or web address] URL: [email or web address] Date: 08/09/2024 Prepared by: Daved Bull  Exercises - Supine March  - 1 x daily - 7 x weekly - 3 sets - 10 reps - Supine Posterior Pelvic Tilt  - 1 x daily - 7 x weekly - 3 sets - 10 reps - Supine Ankle Pumps  - 1 x daily - 7 x weekly - 3 sets - 10 reps - Supine Heel Slide  - 1 x daily - 7 x weekly - 3 sets - 10 reps - Side  Stepping with Resistance at Thighs and Counter Support  - 1 x daily - 5 x weekly - 3 sets - 10 reps - Forward Backward Monster Walk with Band at [employer] and Counter Support  - 1 x daily - 5 x weekly - 3 sets - 10 reps - Walking Step Over  - 1 x daily - 5 x weekly - 3 sets - 10 reps - Standing Tandem Balance with Counter Support  - 1 x daily - 5 x weekly - 1 sets - 2-3 reps - 45-60 seconds hold - Tandem Walking with Counter Support  - 1 x daily - 5 x weekly - 3 sets - 10 reps - Romberg Stance Eyes Closed on Foam Pad  - 1 x daily - 5 x weekly - 3 sets - 10 reps - Supine Bridge  - 1 x daily - 7 x weekly - 3 sets - 10 reps - 5 sec hold - Supine March  - 1 x daily - 7 x weekly - 3 sets - 10 reps  Verbally added LTR and SKTC  Can try slow STS at home and daily walks in home w/ supervision 2-5 minutes on days where BP is better.  GOALS: Goals reviewed with patient? Yes  SHORT TERM GOALS: Target date: 07/22/2024  Pt will be independent and compliant with introductory strength and balance focused HEP in order to maintain functional progress and improve mobility. Baseline:  Initiated on eval. Goal status: MET  2.  Pt will decrease 5xSTS to </=18.03 seconds in  order to demonstrate decreased risk for falls and improved functional bilateral LE strength and power. Baseline: 23.03 sec w/ int hand support, 19.38 sec with BUE support (10/23) Goal status: IN PROGRESS  3.  TUG to be assessed w/ STG set as appropriate. Baseline: 15.81 sec w/ rollator SBA (10/6), 13.75 sec with rollator SBA (10/23) Goal status: REVISED - LTG only  4.  Pt will ambulate>/=396 feet on to demonstrate improved endurance for functional tasks in home and community. Baseline: 38' w/ rollator SBA (10/6), 358 ft with rollator SBA (10/23) Goal status: IN PROGRESS  5.  Pt will increase BERG balance score to >/=45/56 to demonstrate improved static balance. Baseline: 40/56 (10/6), 42/56 (10/23) Goal status: IN PROGRESS   LONG TERM GOALS: Target date: 09/01/2024 (updated to cert date)  Pt will be independent and compliant with advanced and finalized strength and balance focused HEP in order to maintain functional progress and improve mobility. Baseline: 3 days consistently (11/20) Goal status: MET  2.  Pt will decrease 5xSTS to </=13.03 seconds in order to demonstrate decreased risk for falls and improved functional bilateral LE strength and power. Baseline: 23.03 sec w/ int hand support, 19.38 sec with BUE support (10/23), 15.4 sec no UE (12/4) Goal status: IN PROGRESS  3.  Pt will demonstrate TUG of </=12 seconds in order to decrease risk of falls and improve functional mobility using LRAD. Baseline: 15.81 sec w/ rollator SBA (10/6), 13.75 sec with rollator SBA (10/23), 13.28 sec no AD (12/4) Goal status: IN PROGRESS  4.  Pt will ambulate>/=400 feet on to demonstrate improved endurance for functional tasks in home and community. Baseline: 296' w/ rollator SBA (10/6), 358 ft with rollator SBA (10/23), 278 ft no AD (12/4) Goal status: IN PROGRESS  5.  Pt will increase BERG balance score to >/=50/56 to demonstrate improved static balance. Baseline: 40/56 (10/6), 42/56  (10/23), 50/56 (12/2) Goal status: MET  6.  Pt will appropriately  wean from soft cervical collar to improve postural awareness, ROM, and visual scanning of environment needed for upright stability. Baseline: Following up w/ managing MD, no longer necessary to wear collar (12/4) Goal status: MET   NEW SHORT TERM GOALS:   Target date: 10/04/2024  Pt will improve FGA to 16/30 for decreased fall risk  Baseline: 12/30 (12/4) Goal status: INITIAL  2.  Pt will start to wean off of wearing compression garments as safe and able Baseline: BLE ACE wraps, BLE compression stockings, abdominal binder 912/4) Goal status: INITIAL   NEW LONG TERM GOALS:  Target date: 11/01/2024  Pt will improve FGA to 16/30 for decreased fall risk  Baseline: 12/30 (12/4) Goal status: INITIAL  2.  Pt will decrease 5xSTS to </=13.03 seconds in order to demonstrate decreased risk for falls and improved functional bilateral LE strength and power. Baseline: 23.03 sec w/ int hand support, 19.38 sec with BUE support (10/23), 15.4 sec no UE (12/5) Goal status: IN PROGRESS  3.  Pt will demonstrate TUG of </=12 seconds in order to decrease risk of falls and improve functional mobility using LRAD. Baseline: 15.81 sec w/ rollator SBA (10/6), 13.75 sec with rollator SBA (10/23), 13.28 sec no AD (12/4) Goal status: IN PROGRESS  4.  Pt will ambulate>/=400 feet on to demonstrate improved endurance for functional tasks in home and community. Baseline: 296' w/ rollator SBA (10/6), 358 ft with rollator SBA (10/23), 278 ft no AD (12/4) Goal status: IN PROGRESS    ASSESSMENT:  CLINICAL IMPRESSION: Emphasis of skilled PT session on continuing to assess vitals, working on dynamic balance and stepping strategy, and working on functional strengthening. Pt does have a drop in BP with position change from sitting to standing but no reports of dizziness or lightheadedness with positional changes or during session. He is able to  complete entire session just wearing BLE compression stockings with no abdominal binder needed. He is challenged by resisted balance tasks this date as well as by wedge sit to stands with medicine ball. He continues to benefit from skilled PT services to work towards increased safety and independence with functional mobility. Continue POC.   OBJECTIVE IMPAIRMENTS: Abnormal gait, decreased activity tolerance, decreased balance, decreased coordination, decreased endurance, decreased mobility, difficulty walking, decreased strength, increased edema, impaired sensation, impaired tone, impaired UE functional use, improper body mechanics, postural dysfunction, and pain.   ACTIVITY LIMITATIONS: carrying, lifting, bending, standing, squatting, stairs, transfers, bathing, toileting, dressing, reach over head, and locomotion level  PARTICIPATION LIMITATIONS: meal prep, cleaning, laundry, medication management, driving, shopping, and community activity  PERSONAL FACTORS: Age, Fitness, Past/current experiences, Time since onset of injury/illness/exacerbation, and 1-2 comorbidities: bilateral torn RTC, severe orthostatic hypotension are also affecting patient's functional outcome.   REHAB POTENTIAL: Good  CLINICAL DECISION MAKING: Evolving/moderate complexity  EVALUATION COMPLEXITY: Moderate  PLAN:  PT FREQUENCY: 1-2x/week + 1xweek (recert)  PT DURATION: 8 weeks + 8 weeks (recert)  PLANNED INTERVENTIONS: 02835- PT Re-evaluation, 97750- Physical Performance Testing, 97110-Therapeutic exercises, 97530- Therapeutic activity, [id number]- Neuromuscular re-education, 97535- Self Care, 02859- Manual therapy, [id number]- Gait training, [phone]- Orthotic Initial, [id number]- Orthotic/Prosthetic subsequent, [phone]- Aquatic Therapy, [phone]- Electrical stimulation (manual), Patient/Family education, Balance training, Stair training, Taping, Joint mobilization, Vestibular training, DME instructions, Cryotherapy, and Moist heat  PLAN  FOR NEXT SESSION: CHECK BP!  How is HEP?  Expand HEP for dynamic balance and strength. SciFit for endurance - pt enjoys.  Bilateral shoulder pain management as needed.  Gait training.  L NMR/tone management.  Bilateral hip strengthening.  Standing balance on decline/incline.  Resisted walking/perturbations static and dynamic.  Leg press vs supported squats. FGA impairments  Aquatics:  ai chi, balance, LE and core strength   Waddell Southgate, PT Waddell Southgate, PT, DPT, CSRS     09/13/2024, 2:00 PM

## 2024-09-13 NOTE — Telephone Encounter (Signed)
 Duplicate encounter.

## 2024-09-13 NOTE — Telephone Encounter (Signed)
 Copied from CRM #[id number]. Topic: Clinical - Request for Lab/Test Order >> Sep 09, 2024 11:56 AM Roselie BROCKS wrote: Reason for CRM: Patient requests orders for a follow up urine orders to be put in to schedule. >> Sep 13, 2024 11:02 AM Suzen RAMAN wrote: Patient called back to check on the status of urine orders being placed in the system. Per patient chart orders have not been placed and unable to schedule appt until orders are placed. Please contact patient once orders are in.    RA#[id number]

## 2024-09-14 ENCOUNTER — Telehealth: Payer: Self-pay | Admitting: *Deleted

## 2024-09-14 NOTE — Telephone Encounter (Signed)
 Pt has a lab appointment on 09/15/24 for U/A recheck

## 2024-09-14 NOTE — Telephone Encounter (Signed)
This encounter is complete

## 2024-09-14 NOTE — Telephone Encounter (Signed)
 Copied from CRM #[id number]. Topic: General - Call Back - No Documentation >> Sep 13, 2024  4:11 PM Dedra B wrote: Reason for CRM: Pt returning call for nurse. Did not see any documentation of call.

## 2024-09-15 ENCOUNTER — Other Ambulatory Visit (INDEPENDENT_AMBULATORY_CARE_PROVIDER_SITE_OTHER)

## 2024-09-15 DIAGNOSIS — R3915 Urgency of urination: Secondary | ICD-10-CM | POA: Diagnosis not present

## 2024-09-15 LAB — POC URINALSYSI DIPSTICK (AUTOMATED)
Blood, UA: NEGATIVE
Glucose, UA: NEGATIVE
Ketones, UA: NEGATIVE
Leukocytes, UA: NEGATIVE
Nitrite, UA: NEGATIVE
Protein, UA: POSITIVE — AB
Spec Grav, UA: 1.02 (ref 1.010–1.025)
Urobilinogen, UA: 0.2 U/dL
pH, UA: 6 (ref 5.0–8.0)

## 2024-09-15 NOTE — Telephone Encounter (Signed)
 Pt came this morning to give urine stated that the only symptoms he has is urgency and discomfort in the urethra while urinating. Pt requests treatment or referral to Urology. urinalysis results are on Epic. Please advise

## 2024-09-15 NOTE — Telephone Encounter (Signed)
 Spoke with pt voiced understanding he will get a call to schedule appointment

## 2024-09-15 NOTE — Telephone Encounter (Signed)
 The UA today did not show bacteria. However I did refer him to Urology to evaluate further

## 2024-09-15 NOTE — Addendum Note (Signed)
 Addended by: JOHNNY SENIOR A on: 09/15/2024 12:16 PM   Modules accepted: Orders

## 2024-09-16 ENCOUNTER — Encounter: Payer: Self-pay | Admitting: Physical Medicine and Rehabilitation

## 2024-09-20 ENCOUNTER — Other Ambulatory Visit: Payer: Self-pay | Admitting: Family Medicine

## 2024-09-21 ENCOUNTER — Other Ambulatory Visit: Payer: Self-pay

## 2024-09-21 ENCOUNTER — Other Ambulatory Visit (HOSPITAL_COMMUNITY): Payer: Self-pay

## 2024-09-21 MED ORDER — LIDOCAINE 5 % EX PTCH
2.0000 | MEDICATED_PATCH | CUTANEOUS | 5 refills | Status: AC
  Filled 2024-09-21: qty 30, 15d supply, fill #0
  Filled 2024-10-05: qty 30, 15d supply, fill #1
  Filled 2024-10-20: qty 30, 15d supply, fill #2

## 2024-09-23 ENCOUNTER — Other Ambulatory Visit (HOSPITAL_COMMUNITY): Payer: Self-pay

## 2024-09-24 ENCOUNTER — Encounter: Payer: Self-pay | Admitting: Physical Medicine and Rehabilitation

## 2024-09-24 ENCOUNTER — Encounter: Payer: Self-pay | Admitting: Internal Medicine

## 2024-09-26 ENCOUNTER — Ambulatory Visit: Admitting: Podiatry

## 2024-09-26 ENCOUNTER — Encounter: Payer: Self-pay | Admitting: Podiatry

## 2024-09-26 VITALS — Ht 67.0 in | Wt 186.0 lb

## 2024-09-26 DIAGNOSIS — B351 Tinea unguium: Secondary | ICD-10-CM | POA: Diagnosis not present

## 2024-09-26 DIAGNOSIS — M79675 Pain in left toe(s): Secondary | ICD-10-CM | POA: Diagnosis not present

## 2024-09-26 DIAGNOSIS — E118 Type 2 diabetes mellitus with unspecified complications: Secondary | ICD-10-CM

## 2024-09-26 DIAGNOSIS — M79674 Pain in right toe(s): Secondary | ICD-10-CM

## 2024-09-26 MED ORDER — GABAPENTIN 300 MG PO CAPS: 300.0000 mg | ORAL_CAPSULE | Freq: Three times a day (TID) | ORAL | 1 refills | Status: AC | PRN

## 2024-09-26 NOTE — Progress Notes (Signed)
 This patient returns to my office for at risk foot care.  This patient requires this care by a professional since this patient will be at risk due to having This patient is unable to cut nails himself since the patient cannot reach his nails.These nails are painful walking and wearing shoes.  This patient presents for at risk foot care today.  General Appearance  Alert, conversant and in no acute stress.  Vascular  Dorsalis pedis and posterior tibial  pulses are palpable  bilaterally.  Capillary return is within normal limits  bilaterally. Temperature is within normal limits  bilaterally.  Neurologic  Senn-Weinstein monofilament wire test within normal limits  bilaterally. Muscle power within normal limits bilaterally.  Nails Thick disfigured discolored nails with subungual debris  from hallux to fifth toes bilaterally. No evidence of bacterial infection or drainage bilaterally.  Orthopedic  No limitations of motion  feet .  No crepitus or effusions noted.  No bony pathology or digital deformities noted.  Skin  normotropic skin with no porokeratosis noted bilaterally.  No signs of infections or ulcers noted.     Onychomycosis  Pain in right toes  Pain in left toes  Consent was obtained for treatment procedures.   Mechanical debridement of nails 1-5  bilaterally performed with a nail nipper.  Filed with dremel without incident.    Return office visit     3 months                Told patient to return for periodic foot care and evaluation due to potential at risk complications.   Helane Gunther DPM

## 2024-09-27 ENCOUNTER — Ambulatory Visit: Admitting: Occupational Therapy

## 2024-09-27 ENCOUNTER — Ambulatory Visit: Admitting: Physical Therapy
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
## 2024-10-04 ENCOUNTER — Ambulatory Visit: Admitting: Occupational Therapy

## 2024-10-05 ENCOUNTER — Other Ambulatory Visit: Payer: Self-pay | Admitting: Physical Medicine and Rehabilitation

## 2024-10-06 ENCOUNTER — Telehealth: Payer: Self-pay | Admitting: Physical Therapy

## 2024-10-06 ENCOUNTER — Ambulatory Visit: Attending: Physician Assistant | Admitting: Physical Therapy

## 2024-10-06 DIAGNOSIS — R29898 Other symptoms and signs involving the musculoskeletal system: Secondary | ICD-10-CM | POA: Insufficient documentation

## 2024-10-06 DIAGNOSIS — R2689 Other abnormalities of gait and mobility: Secondary | ICD-10-CM | POA: Insufficient documentation

## 2024-10-06 DIAGNOSIS — R29818 Other symptoms and signs involving the nervous system: Secondary | ICD-10-CM | POA: Insufficient documentation

## 2024-10-06 DIAGNOSIS — G8252 Quadriplegia, C1-C4 incomplete: Secondary | ICD-10-CM | POA: Insufficient documentation

## 2024-10-06 DIAGNOSIS — R208 Other disturbances of skin sensation: Secondary | ICD-10-CM | POA: Insufficient documentation

## 2024-10-06 DIAGNOSIS — M6281 Muscle weakness (generalized): Secondary | ICD-10-CM | POA: Insufficient documentation

## 2024-10-06 DIAGNOSIS — R278 Other lack of coordination: Secondary | ICD-10-CM | POA: Insufficient documentation

## 2024-10-06 DIAGNOSIS — R293 Abnormal posture: Secondary | ICD-10-CM | POA: Insufficient documentation

## 2024-10-06 NOTE — Telephone Encounter (Signed)
 This is to document my attempt to call patient after no-show for PT appt this AM.  This is patient's # 1 missed appt.   Primary phone number(s) was used in efforts to contact the patient.   Spoke to patient and spouse [name] to remind them of clinic attendance policy and upcoming therapy visit.  They thought appt today was at 12:15 not 10:15.  Are willing to reschedule if similar time opens up in coming weeks.  Reminded of next land appt.  Daved Bull, PT, DPT

## 2024-10-07 ENCOUNTER — Other Ambulatory Visit (HOSPITAL_COMMUNITY): Payer: Self-pay

## 2024-10-07 ENCOUNTER — Other Ambulatory Visit: Payer: Self-pay | Admitting: Cardiovascular Disease

## 2024-10-11 ENCOUNTER — Encounter: Payer: Self-pay | Admitting: Physical Therapy

## 2024-10-11 ENCOUNTER — Ambulatory Visit: Admitting: Physical Therapy

## 2024-10-11 ENCOUNTER — Ambulatory Visit

## 2024-10-11 VITALS — BP 118/69 | HR 83

## 2024-10-11 DIAGNOSIS — R2689 Other abnormalities of gait and mobility: Secondary | ICD-10-CM | POA: Diagnosis present

## 2024-10-11 DIAGNOSIS — R293 Abnormal posture: Secondary | ICD-10-CM | POA: Diagnosis present

## 2024-10-11 DIAGNOSIS — R29818 Other symptoms and signs involving the nervous system: Secondary | ICD-10-CM | POA: Diagnosis present

## 2024-10-11 DIAGNOSIS — M6281 Muscle weakness (generalized): Secondary | ICD-10-CM

## 2024-10-11 DIAGNOSIS — R278 Other lack of coordination: Secondary | ICD-10-CM

## 2024-10-11 DIAGNOSIS — R29898 Other symptoms and signs involving the musculoskeletal system: Secondary | ICD-10-CM | POA: Diagnosis present

## 2024-10-11 DIAGNOSIS — G8252 Quadriplegia, C1-C4 incomplete: Secondary | ICD-10-CM | POA: Diagnosis present

## 2024-10-11 DIAGNOSIS — R208 Other disturbances of skin sensation: Secondary | ICD-10-CM | POA: Diagnosis present

## 2024-10-11 NOTE — Therapy (Signed)
 " OUTPATIENT PHYSICAL THERAPY NEURO TREATMENT   Patient Name: Jerome Vaughn MRN: [id number] DOB:16-Aug-1951, 74 y.o., male Today's Date: 10/11/2024   PCP: Jerome Vaughn LABOR, MD REFERRING PROVIDER: [name] PARAS, PA-C   END OF SESSION:    PT End of Session - 10/11/24 1322     Visit Number 18    Number of Visits 23   recert   Date for Recertification  11/15/24   recert, to allow for scheduling delays   Authorization Type [employer]    Progress Note Due on Visit 10    PT Start Time 1315    PT Stop Time 1400    PT Time Calculation (min) 45 min    Equipment Utilized During Treatment Gait belt    Activity Tolerance Patient tolerated treatment well    Behavior During Therapy WFL for tasks assessed/performed                     Past Medical History:  Diagnosis Date   Carpal tunnel syndrome    both hands   Colon polyps    Coronary artery disease    sees Jerome Vaughn   Diabetes mellitus    sees Dr. Stefano Vaughn   Diabetes mellitus [facility]) 2009   Ganglion cyst of wrist, right 1989   GERD (gastroesophageal reflux disease)    History of echocardiogram    a. Echo 4/17: Moderate LVH, EF 50-55%, mild LAE, no pericardial effusion   Hyperlipidemia    Hypertension    MGUS (monoclonal gammopathy of unknown significance)    sees Jerome Vaughn   Neuropathy    gets foot exams with Dr. Thresa Vaughn (podiatry)   Pneumonia    Rotator cuff injury    Past Surgical History:  Procedure Laterality Date   ANTERIOR CERVICAL DECOMP/DISCECTOMY FUSION N/A 04/19/2024   Procedure: ANTERIOR CERVICAL DECOMPRESSION/DISCECTOMY FUSION CERVICAL SIX-SEVEN;  Surgeon: Jerome Dorn MATSU, MD;  Location: [facility] OR;  Service: Neurosurgery;  Laterality: N/A;  ACDF C67   CARDIAC CATHETERIZATION N/A 11/21/2015   Procedure: Left Heart Cath and Coronary Angiography;  Surgeon: Jerome Vaughn Sharps, MD;  Location: [facility] INVASIVE CV LAB;  Service: Cardiovascular;  Laterality: N/A;   COLONOSCOPY  03/18/2024   per  Dr. [name], adenomatous polyp, repeat in 5 yrs   CORONARY ARTERY BYPASS GRAFT N/A 11/22/2015   Procedure: CORONARY ARTERY BYPASS GRAFTING (CABG) times five using the left internal mammary, right greater saphenous vein EVH, and left thigh greater saphenous vein EVH;  Surgeon: Jerome Fleeta Ochoa, MD;  Location: [facility] OR;  Service: Open Heart Surgery;  Laterality: N/A;   CORONARY ARTERY BYPASS GRAFT  2017   CORONARY PRESSURE/FFR STUDY N/A 12/14/2018   Procedure: INTRAVASCULAR PRESSURE WIRE/FFR STUDY;  Surgeon: Jerome Candyce RAMAN, MD;  Location: [facility] INVASIVE CV LAB;  Service: Cardiovascular;  Laterality: N/A;   ESOPHAGOGASTRODUODENOSCOPY  03/18/2024   per Dr. [name], normal   frozen shoulder release Right    GANGLION CYST EXCISION Right 1989   LEFT HEART CATH AND CORS/GRAFTS ANGIOGRAPHY N/A 10/27/2017   Procedure: LEFT HEART CATH AND CORS/GRAFTS ANGIOGRAPHY;  Surgeon: Vaughn Jerome LELON, MD;  Location: [name];  Service: Cardiovascular;  Laterality: N/A;   LEFT HEART CATH AND CORS/GRAFTS ANGIOGRAPHY N/A 12/14/2018   Procedure: LEFT HEART CATH AND CORS/GRAFTS ANGIOGRAPHY;  Surgeon: Jerome Candyce RAMAN, MD;  Location: [facility] INVASIVE CV LAB;  Service: Cardiovascular;  Laterality: N/A;   TEE WITHOUT CARDIOVERSION N/A 11/22/2015   Procedure: TRANSESOPHAGEAL ECHOCARDIOGRAM (TEE);  Surgeon: Jerome Fleeta Ochoa, MD;  Location: [facility] OR;  Service: Open Heart Surgery;  Laterality: N/A;   WRIST GANGLION EXCISION Right    Patient Active Problem List   Diagnosis Date Noted   Neuropathic pain 09/09/2024   Fall 05/27/2024   Orthostatic hypotension 05/27/2024   Post-traumatic spasticity 05/27/2024   Coping style affecting medical condition 05/04/2024   Central cord syndrome (HCC) 04/21/2024   Acute incomplete quadriplegia (HCC) 04/21/2024   Shock (HCC) 04/15/2024   Varicose veins of both lower extremities 07/03/2023   Full thickness rotator cuff tear 02/26/2023   Type 2 diabetes mellitus with diabetic  polyneuropathy, without long-term current use of insulin  (HCC) 02/02/2023   Pancytopenia (HCC) 01/09/2023   OA (osteoarthritis) of knee 04/18/2022   Left shoulder pain 04/18/2022   GERD (gastroesophageal reflux disease)    Bacterial infection due to H. pylori 03/01/2020   Monoclonal gammopathy of unknown significance (MGUS) 03/01/2020   Iron  deficiency anemia 02/22/2020   Onychomycosis 02/08/2020   Rectal bleeding 01/24/2020   Indigestion 01/24/2020   Angina pectoris 12/12/2018   Erectile disorder due to medical condition in male 04/08/2018   Chronic combined systolic (congestive) and diastolic (congestive) heart failure (HCC) 07/29/2017   Pericardial effusion 12/24/2015   S/P CABG x 5 11/22/2015   Abnormal stress test 11/21/2015   Coronary artery disease of bypass graft of native heart with stable angina pectoris    Nonspecific abnormal electrocardiogram (ECG) (EKG) 11/07/2015   Essential hypertension 03/30/2011   Hypercholesterolemia 03/30/2011   Colon polyp 03/30/2011    ONSET DATE: 04/15/2024 (date of injury and hospital admission)  REFERRING DIAG: G82.50 (ICD-10-CM) - Quadriplegia, unspecified  THERAPY DIAG:  Other symptoms and signs involving the musculoskeletal system  Other lack of coordination  Muscle weakness (generalized)  Other symptoms and signs involving the nervous system  Other abnormalities of gait and mobility  Abnormal posture  Rationale for Evaluation and Treatment: Rehabilitation  SUBJECTIVE:                                                                                                                                                                                             SUBJECTIVE STATEMENT:  Wife and pt endorse he is wearing only compression stockings today and they continue to monitor his BP to ensure he is safe with just these garments.  No falls.  No pain.  Ambulating w/ SPC.  Pt accompanied by: significant other - Wife  Jerome Vaughn  PERTINENT HISTORY: DM, mild neurogenic B/B, CAD/CABG 2017, severe orthostatic hypotension, combined CHF, bilateral torn RTC, s/p C6-7 ACDF including discectomy for decompression of spinal cord and exiting nerve  roots with foraminotomies 04/19/2024 per Dr. 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 Yes: NPRS scale: 0/10 Pain location: L hand Pain description: numbness/tingling Aggravating factors: random, weather Relieving factors: unsure  PRECAUTIONS: Cervical, Fall, and Other: pt reports he is to wear soft cervical collar as needed - he prefers it for comfort currently; last available order states OOB AAT - will clarify w/ Dr. Cornelio - deferred to surgeon  RED FLAGS: Bowel or bladder incontinence: Yes: neurogenic B/B   WEIGHT BEARING RESTRICTIONS: No  FALLS: Has patient fallen in last 6 months? Yes. Number of falls 2 - see eval subjective  LIVING ENVIRONMENT: Lives with: lives with their spouse and adult grandchild Lives in: House/apartment Stairs: Yes: External: 3 steps; can reach both Has following equipment at home: Walker - 4 wheeled, shower chair, bed side commode, Grab bars, and transport  PLOF: Needs assistance with ADLs, Needs assistance with homemaking, Needs assistance with gait, and Needs assistance with transfers  PATIENT GOALS: to work on pain and walking  OBJECTIVE:  Note: Objective measures were completed at Evaluation unless otherwise noted.  DIAGNOSTIC FINDINGS:  Cervical MRI 04/16/2024: IMPRESSION: 1. No acute fracture or ligamentous injury of the cervical spine. 2. Focal hyperintense T2-weighted signal within the spinal cord at the C3-4 levels and at C7, which may indicate myelomalacia or spinal cord edema. 3. Severe spinal canal stenosis and bilateral neural foraminal stenosis at C6-7. 4. Mild spinal canal stenosis and  severe bilateral neural foraminal stenosis at C3-4. 5. Moderate left C2-3, left C4-5 and bilateral C5-6 neural foraminal stenosis.  COGNITION: Overall cognitive status: Within functional limits for tasks assessed   SENSATION: Light touch: impaired knee to ankle bilaterally  COORDINATION: BLE RAMS:  WNL Bilateral Heel-to-shin:  limited by functional weakness of hips  EDEMA:  Bilateral LE mild non-pitting edema at baseline prior to incident - wears TED hose for orthostatic management.  MUSCLE TONE: LLE: Mild, Hypertonic, and Modifed Ashworth Scale 1+ = Slight increase in muscle tone, manifested by a catch, followed by minimal resistance throughout the remainder (less than half) of the ROM  POSTURE: posterior pelvic tilt and upper trunk rigidity - limited assessment by abdominal binder and soft cervical collar  LOWER EXTREMITY ROM:     Active  Right Eval Left Eval  Hip flexion Grossly WNL  Hip extension   Hip abduction   Hip adduction   Hip internal rotation   Hip external rotation   Knee flexion   Knee extension   Ankle dorsiflexion   Ankle plantarflexion    Ankle inversion    Ankle eversion     (Blank rows = not tested)  LOWER EXTREMITY MMT:    MMT Right Eval Left Eval  Hip flexion 4 4  Hip extension    Hip abduction    Hip adduction    Hip internal rotation    Hip external rotation    Knee flexion    Knee extension 4+ 4+  Ankle dorsiflexion 4- 4-  Ankle plantarflexion    Ankle inversion    Ankle eversion    (Blank rows = not tested)  BED MOBILITY:  Findings: Sit to supine Modified independence Supine to sit Modified independence Rolling to Right Modified independence Rolling to Left Modified independence Pt reports using insertable bed rails for independence.  TRANSFERS: Sit to stand: SBA  Assistive device utilized: Environmental Consultant -  4 wheeled     Stand to sit: SBA  Assistive device utilized: Environmental Consultant - 4 wheeled     Chair to chair: SBA  Assistive device  utilized: Environmental Consultant - 4 wheeled       RAMP:  Not tested  CURB:  Not tested  STAIRS: Not tested GAIT: Findings: Gait Characteristics: step through pattern, decreased stride length, decreased hip/knee flexion- Right, decreased hip/knee flexion- Left, decreased trunk rotation, trunk flexed, and narrow BOS, Distance walked: various clinic distances, Assistive device utilized:Walker - 4 wheeled, Level of assistance: SBA, and Comments: No overt LOB, crossover stepping, ataxia or marked pathway deviation.  Slow pace.  FUNCTIONAL TESTS:  5 times sit to stand: 23.03 sec intermittent hand support on rollator, pt denies dizziness or lightheadedness Timed up and go (TUG): TBA 2 minute walk test: TBA Berg Balance Scale:  TBA    PATIENT SURVEYS:  None completed due to time.                                                                                                                              TREATMENT DATE: 10/11/2024  Self-Care BP assessed in LUE in siting and in standing. Pt's BP does have a drop with transition to standing but he has no dizziness or lightheadedness. No other complaints of dizziness or lightheadedness during session. Vitals:   10/11/24 1319  BP: 118/69  Pulse: 83    TherAct FGA:  [facility] PT Assessment - 10/11/24 1327       Functional Gait  Assessment   Gait assessed  Yes    Gait Level Surface Walks 20 ft in less than 5.5 sec, no assistive devices, good speed, no evidence for imbalance, normal gait pattern, deviates no more than 6 in outside of the 12 in walkway width.    Change in Gait Speed Able to change speed, demonstrates mild gait deviations, deviates 6-10 in outside of the 12 in walkway width, or no gait deviations, unable to achieve a major change in velocity, or uses a change in velocity, or uses an assistive device.    Gait with Horizontal Head Turns Performs head turns smoothly with no change in gait. Deviates no more than 6 in outside 12 in walkway width     Gait with Vertical Head Turns Performs head turns with no change in gait. Deviates no more than 6 in outside 12 in walkway width.    Gait and Pivot Turn Pivot turns safely in greater than 3 sec and stops with no loss of balance, or pivot turns safely within 3 sec and stops with mild imbalance, requires small steps to catch balance.    Step Over Obstacle Is able to step over one shoe box (4.5 in total height) but must slow down and adjust steps to clear box safely. May require verbal cueing.    Gait with Narrow Base of Support Ambulates less than 4 steps heel to toe or cannot perform without assistance.  Gait with Eyes Closed Walks 20 ft, uses assistive device, slower speed, mild gait deviations, deviates 6-10 in outside 12 in walkway width. Ambulates 20 ft in less than 9 sec but greater than 7 sec.    Ambulating Backwards Walks 20 ft, uses assistive device, slower speed, mild gait deviations, deviates 6-10 in outside 12 in walkway width.    Steps Alternating feet, must use rail.    Total Score 20    FGA comment: 20/30 = moderate fall risk         -4 inch reverse lunge w/ BUE support 2x10 each LE > lateral 4 offset squat 2x10 each LE (performed large circuit style) -Standing w/ unilateral UE support for hip flexor hang off 4 step into knee-to-chest march x15 each side -SciFit x8 minutes performed on level 4.0 throughout for dynamic cooldown and general endurance using reciprocal mobility.  Cued to find pace that reflects 5-6/10 workload, pt self-selects range of 70 - 90 steps/min.  PATIENT EDUCATION: Education details: continue HEP.  Having pt discuss left index finger splint or taping for extension if they deem appropriate. Person educated: Patient and Spouse Education method: Medical Illustrator Education comprehension: verbalized understanding, returned demonstration, and needs further education  HOME EXERCISE PROGRAM: Access Code: [email or web address] URL:  [email or web address] Date: 08/09/2024 Prepared by: Daved Bull  Exercises - Supine March  - 1 x daily - 7 x weekly - 3 sets - 10 reps - Supine Posterior Pelvic Tilt  - 1 x daily - 7 x weekly - 3 sets - 10 reps - Supine Ankle Pumps  - 1 x daily - 7 x weekly - 3 sets - 10 reps - Supine Heel Slide  - 1 x daily - 7 x weekly - 3 sets - 10 reps - Side Stepping with Resistance at Thighs and Counter Support  - 1 x daily - 5 x weekly - 3 sets - 10 reps - Forward Backward Monster Walk with Band at [employer] and Counter Support  - 1 x daily - 5 x weekly - 3 sets - 10 reps - Walking Step Over  - 1 x daily - 5 x weekly - 3 sets - 10 reps - Standing Tandem Balance with Counter Support  - 1 x daily - 5 x weekly - 1 sets - 2-3 reps - 45-60 seconds hold - Tandem Walking with Counter Support  - 1 x daily - 5 x weekly - 3 sets - 10 reps - Romberg Stance Eyes Closed on Foam Pad  - 1 x daily - 5 x weekly - 3 sets - 10 reps - Supine Bridge  - 1 x daily - 7 x weekly - 3 sets - 10 reps - 5 sec hold - Supine March  - 1 x daily - 7 x weekly - 3 sets - 10 reps  Verbally added LTR and SKTC  Can try slow STS at home and daily walks in home w/ supervision 2-5 minutes on days where BP is better.  GOALS: Goals reviewed with patient? Yes  SHORT TERM GOALS: Target date: 07/22/2024  Pt will be independent and compliant with introductory strength and balance focused HEP in order to maintain functional progress and improve mobility. Baseline:  Initiated on eval. Goal status: MET  2.  Pt will decrease 5xSTS to </=18.03 seconds in order to demonstrate decreased risk for falls and improved functional bilateral LE strength and power. Baseline: 23.03 sec w/ int hand support, 19.38 sec with BUE support (10/23) Goal status: IN  PROGRESS  3.  TUG to be assessed w/ STG set as appropriate. Baseline: 15.81 sec w/ rollator SBA (10/6), 13.75 sec with rollator SBA (10/23) Goal status: REVISED - LTG only  4.  Pt  will ambulate>/=396 feet on to demonstrate improved endurance for functional tasks in home and community. Baseline: 20' w/ rollator SBA (10/6), 358 ft with rollator SBA (10/23) Goal status: IN PROGRESS  5.  Pt will increase BERG balance score to >/=45/56 to demonstrate improved static balance. Baseline: 40/56 (10/6), 42/56 (10/23) Goal status: IN PROGRESS   LONG TERM GOALS: Target date: 09/01/2024 (updated to cert date)  Pt will be independent and compliant with advanced and finalized strength and balance focused HEP in order to maintain functional progress and improve mobility. Baseline: 3 days consistently (11/20) Goal status: MET  2.  Pt will decrease 5xSTS to </=13.03 seconds in order to demonstrate decreased risk for falls and improved functional bilateral LE strength and power. Baseline: 23.03 sec w/ int hand support, 19.38 sec with BUE support (10/23), 15.4 sec no UE (12/4) Goal status: IN PROGRESS  3.  Pt will demonstrate TUG of </=12 seconds in order to decrease risk of falls and improve functional mobility using LRAD. Baseline: 15.81 sec w/ rollator SBA (10/6), 13.75 sec with rollator SBA (10/23), 13.28 sec no AD (12/4) Goal status: IN PROGRESS  4.  Pt will ambulate>/=400 feet on to demonstrate improved endurance for functional tasks in home and community. Baseline: 296' w/ rollator SBA (10/6), 358 ft with rollator SBA (10/23), 278 ft no AD (12/4) Goal status: IN PROGRESS  5.  Pt will increase BERG balance score to >/=50/56 to demonstrate improved static balance. Baseline: 40/56 (10/6), 42/56 (10/23), 50/56 (12/2) Goal status: MET  6.  Pt will appropriately wean from soft cervical collar to improve postural awareness, ROM, and visual scanning of environment needed for upright stability. Baseline: Following up w/ managing MD, no longer necessary to wear collar (12/4) Goal status: MET   NEW SHORT TERM GOALS:   Target date: 10/04/2024  Pt will improve FGA to 16/30  for decreased fall risk  Baseline: 12/30 (12/4); 20/30 (1/13) Goal status: MET  2.  Pt will start to wean off of wearing compression garments as safe and able Baseline: BLE ACE wraps, BLE compression stockings, abdominal binder 912/4); only wearing compression stockings (1/13) Goal status: MET   NEW LONG TERM GOALS:  Target date: 11/01/2024  Pt will improve FGA to 24/30 for decreased fall risk  Baseline: 12/30 (12/4); 20/30 (1/13) Goal status: REVISED  2.  Pt will decrease 5xSTS to </=13.03 seconds in order to demonstrate decreased risk for falls and improved functional bilateral LE strength and power. Baseline: 23.03 sec w/ int hand support, 19.38 sec with BUE support (10/23), 15.4 sec no UE (12/5) Goal status: IN PROGRESS  3.  Pt will demonstrate TUG of </=12 seconds in order to decrease risk of falls and improve functional mobility using LRAD. Baseline: 15.81 sec w/ rollator SBA (10/6), 13.75 sec with rollator SBA (10/23), 13.28 sec no AD (12/4) Goal status: IN PROGRESS  4.  Pt will ambulate>/=400 feet on to demonstrate improved endurance for functional tasks in home and community. Baseline: 296' w/ rollator SBA (10/6), 358 ft with rollator SBA (10/23), 278 ft no AD (12/4) Goal status: IN PROGRESS    ASSESSMENT:  CLINICAL IMPRESSION: Emphasis of skilled PT session on increasing BLE demand with strengthening tasks.  He was challenged by fatigue with offset squats and  lunges.  He presents with ongoing hip weakness with the right appearing more impacted today.  His BP is holding steady WNL with less support garments donned.  His FGA score also improved to 20/30 demonstrating a moderate vs high fall risk.  Will continue to address gait and balance in aquatic environment at next visit.  Continue POC.   OBJECTIVE IMPAIRMENTS: Abnormal gait, decreased activity tolerance, decreased balance, decreased coordination, decreased endurance, decreased mobility, difficulty walking, decreased  strength, increased edema, impaired sensation, impaired tone, impaired UE functional use, improper body mechanics, postural dysfunction, and pain.   ACTIVITY LIMITATIONS: carrying, lifting, bending, standing, squatting, stairs, transfers, bathing, toileting, dressing, reach over head, and locomotion level  PARTICIPATION LIMITATIONS: meal prep, cleaning, laundry, medication management, driving, shopping, and community activity  PERSONAL FACTORS: Age, Fitness, Past/current experiences, Time since onset of injury/illness/exacerbation, and 1-2 comorbidities: bilateral torn RTC, severe orthostatic hypotension are also affecting patient's functional outcome.   REHAB POTENTIAL: Good  CLINICAL DECISION MAKING: Evolving/moderate complexity  EVALUATION COMPLEXITY: Moderate  PLAN:  PT FREQUENCY: 1-2x/week + 1xweek (recert)  PT DURATION: 8 weeks + 8 weeks (recert)  PLANNED INTERVENTIONS: 02835- PT Re-evaluation, 97750- Physical Performance Testing, 97110-Therapeutic exercises, 97530- Therapeutic activity, [id number]- Neuromuscular re-education, 97535- Self Care, 02859- Manual therapy, [id number]- Gait training, [phone]- Orthotic Initial, [id number]- Orthotic/Prosthetic subsequent, [phone]- Aquatic Therapy, [phone]- Electrical stimulation (manual), Patient/Family education, Balance training, Stair training, Taping, Joint mobilization, Vestibular training, DME instructions, Cryotherapy, and Moist heat  PLAN FOR NEXT SESSION: CHECK BP!  How is HEP?  Expand HEP for dynamic balance and strength. SciFit for endurance - pt enjoys.  Bilateral shoulder pain management as needed.  Gait training.  L NMR/tone management.  Bilateral hip strengthening.  Standing balance on decline/incline.  Resisted walking/perturbations static and dynamic.  Leg press vs supported squats. FGA impairments  Aquatics:  ai chi, balance, LE and core strength   Daved KATHEE Bull, PT, DPT     10/11/2024, 2:06 PM        "

## 2024-10-12 ENCOUNTER — Other Ambulatory Visit: Payer: Self-pay | Admitting: Cardiovascular Disease

## 2024-10-12 NOTE — Telephone Encounter (Signed)
 Lipid Panel completed on 03/25/24

## 2024-10-15 ENCOUNTER — Other Ambulatory Visit: Payer: Self-pay | Admitting: Physical Medicine and Rehabilitation

## 2024-10-18 ENCOUNTER — Ambulatory Visit

## 2024-10-18 DIAGNOSIS — M6281 Muscle weakness (generalized): Secondary | ICD-10-CM

## 2024-10-18 DIAGNOSIS — R29898 Other symptoms and signs involving the musculoskeletal system: Secondary | ICD-10-CM

## 2024-10-18 DIAGNOSIS — R29818 Other symptoms and signs involving the nervous system: Secondary | ICD-10-CM

## 2024-10-18 DIAGNOSIS — R278 Other lack of coordination: Secondary | ICD-10-CM

## 2024-10-18 DIAGNOSIS — R2689 Other abnormalities of gait and mobility: Secondary | ICD-10-CM

## 2024-10-18 DIAGNOSIS — R208 Other disturbances of skin sensation: Secondary | ICD-10-CM

## 2024-10-18 DIAGNOSIS — G8252 Quadriplegia, C1-C4 incomplete: Secondary | ICD-10-CM

## 2024-10-18 NOTE — Therapy (Signed)
 " OUTPATIENT OCCUPATIONAL THERAPY NEURO TREATMENT Patient Name: Jerome Vaughn MRN: [id number] DOB:21-Oct-1950, 74 y.o., male Today's Date: 10/18/2024  PCP: Jerome Garnette LABOR, MD REFERRING PROVIDER: [name] PARAS, PA-C   END OF SESSION:  OT End of Session - 10/18/24 1240     Visit Number 15    Number of Visits 24    Date for Recertification  11/28/24    Authorization Type [employer] MCR Appd 8 OT visits 08/30/24-11/28/24    Authorization Time Period 08/30/24-11/28/24    Authorization - Visit Number 2    Authorization - Number of Visits 8    Progress Note Due on Visit 8    OT Start Time 1231    OT Stop Time 1315    OT Time Calculation (min) 44 min    Equipment Utilized During Treatment fluido    Activity Tolerance Patient tolerated treatment well    Behavior During Therapy WFL for tasks assessed/performed              Past Medical History:  Diagnosis Date   Carpal tunnel syndrome    both hands   Colon polyps    Coronary artery disease    sees Jerome Vaughn   Diabetes mellitus    sees Jerome Vaughn   Diabetes mellitus [facility]) 2009   Ganglion cyst of wrist, right 1989   GERD (gastroesophageal reflux disease)    History of echocardiogram    a. Echo 4/17: Moderate LVH, EF 50-55%, mild LAE, no pericardial effusion   Hyperlipidemia    Hypertension    MGUS (monoclonal gammopathy of unknown significance)    sees Jerome Vaughn   Neuropathy    gets foot exams with Jerome Vaughn (podiatry)   Pneumonia    Rotator cuff injury    Past Surgical History:  Procedure Laterality Date   ANTERIOR CERVICAL DECOMP/DISCECTOMY FUSION N/A 04/19/2024   Procedure: ANTERIOR CERVICAL DECOMPRESSION/DISCECTOMY FUSION CERVICAL SIX-SEVEN;  Surgeon: Jerome Dorn MATSU, MD;  Location: [facility];  Service: Neurosurgery;  Laterality: N/A;  ACDF C67   CARDIAC CATHETERIZATION N/A 11/21/2015   Procedure: Left Heart Cath and Coronary Angiography;  Surgeon: Jerome Vaughn Sharps, MD;  Location: [facility];   Service: Cardiovascular;  Laterality: N/A;   COLONOSCOPY  03/18/2024   per Dr. [name], adenomatous polyp, repeat in 5 yrs   CORONARY ARTERY BYPASS GRAFT N/A 11/22/2015   Procedure: CORONARY ARTERY BYPASS GRAFTING (CABG) times five using the left internal mammary, right greater saphenous vein EVH, and left thigh greater saphenous vein EVH;  Surgeon: Jerome Fleeta Ochoa, MD;  Location: [facility];  Service: Open Heart Surgery;  Laterality: N/A;   CORONARY ARTERY BYPASS GRAFT  2017   CORONARY PRESSURE/FFR STUDY N/A 12/14/2018   Procedure: INTRAVASCULAR PRESSURE WIRE/FFR STUDY;  Surgeon: Jerome Candyce RAMAN, MD;  Location: [facility] INVASIVE [facility];  Service: Cardiovascular;  Laterality: N/A;   ESOPHAGOGASTRODUODENOSCOPY  03/18/2024   per Dr. [name], normal   frozen shoulder release Right    GANGLION CYST EXCISION Right 1989   LEFT HEART CATH AND CORS/GRAFTS ANGIOGRAPHY N/A 10/27/2017   Procedure: LEFT HEART CATH AND CORS/GRAFTS ANGIOGRAPHY;  Surgeon: Vaughn Jerome LELON, MD;  Location: [facility];  Service: Cardiovascular;  Laterality: N/A;   LEFT HEART CATH AND CORS/GRAFTS ANGIOGRAPHY N/A 12/14/2018   Procedure: LEFT HEART CATH AND CORS/GRAFTS ANGIOGRAPHY;  Surgeon: Jerome Candyce RAMAN, MD;  Location: [facility];  Service: Cardiovascular;  Laterality: N/A;   TEE WITHOUT CARDIOVERSION N/A 11/22/2015  Procedure: TRANSESOPHAGEAL ECHOCARDIOGRAM (TEE);  Surgeon: Jerome Fleeta Ochoa, MD;  Location: [facility];  Service: Open Heart Surgery;  Laterality: N/A;   WRIST GANGLION EXCISION Right    Patient Active Problem List   Diagnosis Date Noted   Neuropathic pain 09/09/2024   Fall 05/27/2024   Orthostatic hypotension 05/27/2024   Post-traumatic spasticity 05/27/2024   Coping style affecting medical condition 05/04/2024   Central cord syndrome (HCC) 04/21/2024   Acute incomplete quadriplegia (HCC) 04/21/2024   Shock (HCC) 04/15/2024   Varicose veins of both lower extremities 07/03/2023   Full  thickness rotator cuff tear 02/26/2023   Type 2 diabetes mellitus with diabetic polyneuropathy, without long-term current use of insulin  (HCC) 02/02/2023   Pancytopenia (HCC) 01/09/2023   OA (osteoarthritis) of knee 04/18/2022   Left shoulder pain 04/18/2022   GERD (gastroesophageal reflux disease)    Bacterial infection due to H. pylori 03/01/2020   Monoclonal gammopathy of unknown significance (MGUS) 03/01/2020   Iron  deficiency anemia 02/22/2020   Onychomycosis 02/08/2020   Rectal bleeding 01/24/2020   Indigestion 01/24/2020   Angina pectoris 12/12/2018   Erectile disorder due to medical condition in male 04/08/2018   Chronic combined systolic (congestive) and diastolic (congestive) heart failure (HCC) 07/29/2017   Pericardial effusion 12/24/2015   S/P CABG x 5 11/22/2015   Abnormal stress test 11/21/2015   Coronary artery disease of bypass graft of native heart with stable angina pectoris    Nonspecific abnormal electrocardiogram (ECG) (EKG) 11/07/2015   Essential hypertension 03/30/2011   Hypercholesterolemia 03/30/2011   Colon polyp 03/30/2011    ONSET DATE: 05/17/2024 (referral date)    REFERRING DIAG: G82.50 (ICD-10-CM) - Quadriplegia, unspecified S14.129A (ICD-10-CM) - Central cord syndrome, initial encounter (HCC)    THERAPY DIAG:  Other symptoms and signs involving the musculoskeletal system  Other lack of coordination  Muscle weakness (generalized)  Other symptoms and signs involving the nervous system  Other disturbances of skin sensation  Other abnormalities of gait and mobility  Quadriplegia, C1-C4 incomplete (HCC)  Rationale for Evaluation and Treatment: Rehabilitation  SUBJECTIVE:   SUBJECTIVE STATEMENT: Pt reports no pain I'm doing things with the hand, some days are better than others.  Pt accompanied by: Jerome ([name])   PERTINENT HISTORY: Presented 04/15/2024 after being on a roof cutting a tree branch when the tree fell on him pinning him  against the roof. Pt underwent ACDF C6-7 on 04/19/24. No loss of consciousness. PMH:  significant for diabetes mellitus, CAD with CABG 2017, hypertension, hyperlipidemia  IMPRESSION from MRI on 04/16/24: 1. No acute fracture Vaughn ligamentous injury of the cervical spine. 2. 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 3. Severe spinal canal stenosis and bilateral neural foraminal stenosis at C6-7. 4. Mild spinal canal stenosis and severe bilateral neural foraminal stenosis at C3-4. 5. Moderate left C2-3, left C4-5 and bilateral C5-6 neural foraminal stenosis.  PRECAUTIONS: Cervical, Fall, and Other: soft collar, orthostatic hypotension (wears abdominal binder and TED hoses), lifting restrictions   WEIGHT BEARING RESTRICTIONS: ? UE wt bearing  PAIN:  Are you having pain? No  FALLS: Has patient fallen in last 6 months? Yes. 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  HAND DOMINANCE: Right  ADLs: Transfers/ambulation related to ADLs: rollator Eating: mod I using Rt hand mostly with adapted utensils, Jerome cuts food Grooming: mod I for brushing teeth/shaving using both hands, except Jerome brushes pt's hair UB Dressing: min assist - improving LB Dressing: max assist for TED hose, fluctuating assist for pants/shoes Toileting: supervision and assist for wiping Bathing:  assist w/ lower legs and back  Tub Shower transfers: min assist Equipment: Shower seat with back and Grab bars  IADLs:  Shopping: pt has gone with Jerome 2x since d/c from hospital Light housekeeping: dependent Meal Prep: dependent Community mobility: dependent Medication management: Jerome places in pillbox, Jerome assists Handwriting: 90% legible in print (pt reports signature was always bad)   MOBILITY STATUS: using SPC   UPPER EXTREMITY ROM:  RUE AROM at shoulder and elbow WFL's. Limited more distally with 75% wrist and 50% composite flexion  LUE shoulder movement limited in approx 45* scaption (lesser true flex), 75% ER, 90% IR, Elbows distally WFLs w/ difficulty w/ index PIP extension   UPPER EXTREMITY MMT:   not tested d/t precautions, current deficits and premorbid rotator cuff tears bilaterally    HAND FUNCTION: Grip strength: Right: 5.2 lbs; Left: 29.3 lbs  COORDINATION: 9 Hole Peg test: Right: placed 6 in 2 min;   Left: 117.83 sec  Box and Blocks:  Right 31 blocks, Left 35 blocks 10/18/24: Right: 36 blocks, Left: 47 blocks SENSATION: Light touch: WFL Pt reports some numbness in fingertips (premorbid but more pronounced since accident)   EDEMA: mild Rt hand, slightly worse in fingers Rt hand  MUSCLE TONE: pt has reported spasms and on muscle relaxer's (take at night)   COGNITION: Overall cognitive status: Within functional limits for tasks assessed  VISION: Subjective report: Pt reports no changes  Baseline vision: Bifocals Visual history: none    PERCEPTION: Not tested  PRAXIS: Not tested  OBSERVATIONS: bilateral hand deficits (Rt worse than Lt), bilateral shoulder deficits (Lt worse than Rt)  Premorbid rotator cuff tears but pt reports he had majority of ROM both shoulders prior to accident - now limited in ROM Lt shoulder                                                                                                                             TREATMENT  DATE:  - Therapeutic exercises completed for duration as noted below including:  Pt placed BUE in Fluidotherapy machine with supervised ROM x 10 min. Pt was educated to complete tendon glides and active B hand ROM during modality time to improve ROM and decrease pain/stiffness of affected extremity by use of the machine's massaging action and thermal properties. Pt reported not completing therapeutic exercises as  much as I should, specifically theraputty HEP with red putty. Reviewed putty HEP with red clinic putty. Educated pt in importance of regular completion of putty HEP to increase B grip strength for carryover with ability to open jars and lift items as well as complete functional transfers.     - Self-care/home management completed for duration as noted below including: Re-assessed grip strength and Box and Blocks. Goal addressing box and blocks has been met, see Goals below for updated measurements.     PATIENT EDUCATION: Education details: SEE ABOVE Person educated: Patient and Spouse Education method: Explanation, Verbal cues, and Handouts Education comprehension: verbalized understanding and returned demonstration  HOME EXERCISE PROGRAM: 07/04/24: yellow theraputty HEP (ACCESS CODE [id number]), coordination 07/28/24: Isolated finger movements (ACCESS CODE [id number]) 08/09/24: splint wear and care 08/22/24: desensitization and sleep positioning    GOALS: Goals reviewed with patient? Yes  SHORT TERM GOALS: Target date: 07/22/24  Pt independent with HEP for bilateral coordination and grip strength Baseline:  Goal status: MET   2.  Pt independent with modified shoulder HEP (premorbid rotator cuff tear)  Baseline:  Goal status: MET   3.  Pt to consistently be mod I for donning/doffing overhead shirts and UB bathing w/ AE prn Baseline:  07/21/24: Pt reports donning/doffing UB short sleeve shirts independently/Mod I, needs Min A for donning long sleeves (adjustments at  sleeves only). Pt also educated in and looking to obtain long handled sponge. Goal status: PARTIALLY MET  4.  Pt to perform LE dressing w/ min assist using AE prn (w/ exception to TED hoses)  Baseline: mod to max assist 07/21/24: occasional assistance for pants at this time, doing his own shoes Goal status: MET  5.  Grip strength to increase by 10 lbs Vaughn more on Rt side and 5 lbs Vaughn more on Lt side Baseline: 5.2 lbs, 29.3 lbs 07/21/24: 13.0 lbs, 29.1 lbs 11.24.25: 12.7 R , 31.7 lbs L Goal status: IN PROGRESS  6.  Improve BUE function as evidenced by increasing Box & Blocks by 5 Vaughn more blocks Baseline: Rt = 31, Lt = 35 07/21/24: R: 36, L: 39 08/22/24: 39 blocks L hand Goal status: PARTIALLY MET (met on Rt, almost met on Lt)     LONG TERM GOALS: Target date: 08/22/24  Independent with updated HEP  Baseline:  Goal status: IN PROGRESS  2.  Pt to be mod I for all BADLS including brushing hair and LB dressing (exception TED hoses)  Baseline:  07/21/24: Pt reports improved grooming and is obtaining long handled comb, pt reports occasional assistance with pants especially when already wearing compression stockings Goal status: IN PROGRESS  3.  Improve Rt hand coordination as evidenced by reducing speed on 9 hole peg test in under 2 minutes Baseline:  07/21/24: 1 minutes, 24 seconds Goal status:  MET  4.  Improve Lt hand coordination as evidenced by reducing speed on 9 hole peg test in under 2 minutes Baseline:  07/21/24: 1 minute, 29 seconds Goal status:  MET  5.  Pt to perform light IADLS in standing with countertop support w/o LOB  Baseline:  Goal status: MET  6.  Pt to improve LUE shoulder ROM to 70* flexion Vaughn greater for mid level reaching Baseline:  07/21/24: 80 degrees measured however some scaption noted 08/22/24: 110, scaption noted Goal status: IN PROGRESS     NEW SHORT TERM GOALS: Target date: 10/31/24 Grip strength to increase by 10 lbs Vaughn more on Rt side  and  5 lbs Vaughn more on Lt side Baseline: 5.2 lbs, 29.3 lbs 07/21/24: 13.0 lbs, 29.1 lbs 11.24.25: 12.7 R , 31.7 lbs L 10/18/24: 12.1 R, 28.4 L hand Goal status: IN PROGRESS  2.  Improve BUE function as evidenced by increasing Box & Blocks by 5 Vaughn more blocks Baseline: Rt = 31, Lt = 35 07/21/24: R: 36, L: 39 08/22/24: 39 blocks L hand 10/18/24: R=36, R=47 Goal status: MET    NEW LONG TERM GOALS: Target date:11/28/24  Independent with updated HEP  Baseline:  Goal status: IN PROGRESS  2.  Pt to be mod I for all BADLS including brushing hair and LB dressing (exception TED hoses)  Baseline:  08/22/24: Pt reports improved grooming and is obtaining long handled comb, pt reports occasional assistance with pants especially when already wearing compression stockings Goal status: IN PROGRESS   ASSESSMENT:  CLINICAL IMPRESSION: Patient is a 74 y.o. male who was seen today for occupational therapy tx for Central cord syndrome. Hx includes CAD, RTC injury. Pt demonstrated improved FM coordination as evidenced by Box and Blocks score. Pt demonstrated decline in grip strength, reported that he had not been completing grip HEP like he should have, stressed importance of regular completion.   PERFORMANCE DEFICITS: in functional skills including ADLs, IADLs, coordination, dexterity, sensation, edema, ROM, strength, pain, flexibility, Fine motor control, Gross motor control, mobility, body mechanics, cardiopulmonary status limiting function, decreased knowledge of precautions, decreased knowledge of use of DME, and UE functional use.   IMPAIRMENTS: are limiting patient from ADLs, IADLs, rest and sleep, work, leisure, and social participation.   CO-MORBIDITIES: has co-morbidities such as orthostatic hypotension, bilateral rotator cuff tears that affects occupational performance. Patient will benefit from skilled OT to address above impairments and improve overall function.  MODIFICATION Vaughn ASSISTANCE TO  COMPLETE EVALUATION: Min-Moderate modification of tasks Vaughn assist with assess necessary to complete an evaluation.  OT OCCUPATIONAL PROFILE AND HISTORY: Detailed assessment: Review of records and additional review of physical, cognitive, psychosocial history related to current functional performance.  CLINICAL DECISION MAKING: Moderate - several treatment options, min-mod task modification necessary  REHAB POTENTIAL: Good  EVALUATION COMPLEXITY: Moderate    PLAN:  OT FREQUENCY: 1x/week  OT DURATION: 8 weeks  PLANNED INTERVENTIONS: 97535 self care/ADL training, 02889 therapeutic exercise, 97530 therapeutic activity, 97112 neuromuscular re-education, 97140 manual therapy, 97113 aquatic therapy, 97018 paraffin, 02960 fluidotherapy, 97010 moist heat, 97760 Orthotic Initial, 97763 Orthotic/Prosthetic subsequent, passive range of motion, functional mobility training, patient/family education, and DME and/Vaughn AE instructions  RECOMMENDED OTHER SERVICES: none at this time  CONSULTED AND AGREED WITH PLAN OF CARE: Patient and family member/caregiver  PLAN FOR NEXT SESSION:  Continue fluido Progress grip strength and coordination activities     [employer], OT 10/18/2024, 1:33 PM           "

## 2024-10-19 ENCOUNTER — Encounter: Payer: Self-pay | Admitting: Physical Medicine and Rehabilitation

## 2024-10-20 ENCOUNTER — Encounter: Payer: Self-pay | Admitting: Physical Therapy

## 2024-10-20 ENCOUNTER — Other Ambulatory Visit: Payer: Self-pay

## 2024-10-20 ENCOUNTER — Ambulatory Visit: Admitting: Physical Therapy

## 2024-10-20 DIAGNOSIS — M6281 Muscle weakness (generalized): Secondary | ICD-10-CM

## 2024-10-20 DIAGNOSIS — R278 Other lack of coordination: Secondary | ICD-10-CM

## 2024-10-20 DIAGNOSIS — R2689 Other abnormalities of gait and mobility: Secondary | ICD-10-CM

## 2024-10-20 DIAGNOSIS — R29818 Other symptoms and signs involving the nervous system: Secondary | ICD-10-CM

## 2024-10-20 DIAGNOSIS — R29898 Other symptoms and signs involving the musculoskeletal system: Secondary | ICD-10-CM

## 2024-10-20 DIAGNOSIS — R208 Other disturbances of skin sensation: Secondary | ICD-10-CM
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
## 2024-10-24 ENCOUNTER — Encounter: Payer: Self-pay | Admitting: Family Medicine

## 2024-10-25 ENCOUNTER — Ambulatory Visit

## 2024-10-25 ENCOUNTER — Ambulatory Visit: Admitting: Physical Therapy
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
## 2024-10-26 ENCOUNTER — Ambulatory Visit (HOSPITAL_COMMUNITY): Admission: EM | Admit: 2024-10-26 | Discharge: 2024-10-26 | Disposition: A | Discharge status: Home / Self Care

## 2024-10-26 ENCOUNTER — Other Ambulatory Visit (HOSPITAL_COMMUNITY): Payer: Self-pay

## 2024-10-26 ENCOUNTER — Encounter (HOSPITAL_COMMUNITY): Payer: Self-pay

## 2024-10-26 DIAGNOSIS — J029 Acute pharyngitis, unspecified: Secondary | ICD-10-CM | POA: Diagnosis not present

## 2024-10-26 LAB — POCT RAPID STREP A (OFFICE): Rapid Strep A Screen: NEGATIVE

## 2024-10-26 MED ORDER — AZELASTINE HCL 0.1 % NA SOLN: 1.0000 | Freq: Two times a day (BID) | NASAL | 0 refills | Status: AC

## 2024-10-26 MED ORDER — PREDNISONE 20 MG PO TABS: 40.0000 mg | ORAL_TABLET | Freq: Every day | ORAL | 0 refills | Status: AC

## 2024-10-26 NOTE — ED Provider Notes (Signed)
 " [facility] [place]  Note:  This document was prepared using Dragon voice recognition software and may include unintentional dictation errors.  MRN: [id number] DOB: July 20, 1951  Subjective:   Jerome Vaughn is a 74 y.o. male presenting for difficulty swallowing due to throat inflammation and postnasal drip.  Patient reports that he woke up this morning with phlegm stuck in the back of his throat.  Patient was able to clear some of the phlegm but is still having a feeling in the back of his throat like there is swelling.  Patient is able to breathe without difficulty, no respiratory distress, no hot potato voice, nasal congestion, fever, body aches, chest pain.  Current Medications[1]   Allergies[2]  Past Medical History:  Diagnosis Date   Carpal tunnel syndrome    both hands   Colon polyps    Coronary artery disease    sees Dr. [name]   Diabetes mellitus    sees Dr. [name]   Diabetes mellitus [facility]) 2009   Ganglion cyst of wrist, right 1989   GERD (gastroesophageal reflux disease)    History of echocardiogram    a. Echo 4/17: Moderate LVH, EF 50-55%, mild LAE, no pericardial effusion   Hyperlipidemia    Hypertension    MGUS (monoclonal gammopathy of unknown significance)    sees Dr. [name]   Neuropathy    gets foot exams with Dr. [name] (podiatry)   Pneumonia    Rotator cuff injury      Past Surgical History:  Procedure Laterality Date   ANTERIOR CERVICAL DECOMP/DISCECTOMY FUSION N/A 04/19/2024   Procedure: ANTERIOR CERVICAL DECOMPRESSION/DISCECTOMY FUSION CERVICAL SIX-SEVEN;  Surgeon: [name] MATSU, MD;  Location: [facility] OR;  Service: Neurosurgery;  Laterality: N/A;  ACDF C67   CARDIAC CATHETERIZATION N/A 11/21/2015   Procedure: Left Heart Cath and Coronary Angiography;  Surgeon: [name], MD;  Location: [facility] INVASIVE CV LAB;  Service: Cardiovascular;  Laterality: N/A;   COLONOSCOPY  03/18/2024   per Dr. [name], adenomatous polyp, repeat  in 5 yrs   CORONARY ARTERY BYPASS GRAFT N/A 11/22/2015   Procedure: CORONARY ARTERY BYPASS GRAFTING (CABG) times five using the left internal mammary, right greater saphenous vein EVH, and left thigh greater saphenous vein EVH;  Surgeon: [name], MD;  Location: [facility] OR;  Service: Open Heart Surgery;  Laterality: N/A;   CORONARY ARTERY BYPASS GRAFT  2017   CORONARY PRESSURE/FFR STUDY N/A 12/14/2018   Procedure: INTRAVASCULAR PRESSURE WIRE/FFR STUDY;  Surgeon: [name], MD;  Location: [facility] INVASIVE CV LAB;  Service: Cardiovascular;  Laterality: N/A;   ESOPHAGOGASTRODUODENOSCOPY  03/18/2024   per Dr. [name], normal   frozen shoulder release Right    GANGLION CYST EXCISION Right 1989   LEFT HEART CATH AND CORS/GRAFTS ANGIOGRAPHY N/A 10/27/2017   Procedure: LEFT HEART CATH AND CORS/GRAFTS ANGIOGRAPHY;  Surgeon: Sharps [name], MD;  Location: [name];  Service: Cardiovascular;  Laterality: N/A;   LEFT HEART CATH AND CORS/GRAFTS ANGIOGRAPHY N/A 12/14/2018   Procedure: LEFT HEART CATH AND CORS/GRAFTS ANGIOGRAPHY;  Surgeon: [name], MD;  Location: [facility] INVASIVE CV LAB;  Service: Cardiovascular;  Laterality: N/A;   TEE WITHOUT CARDIOVERSION N/A 11/22/2015   Procedure: TRANSESOPHAGEAL ECHOCARDIOGRAM (TEE);  Surgeon: [name], MD;  Location: [facility] OR;  Service: Open Heart Surgery;  Laterality: N/A;   WRIST GANGLION EXCISION Right     Family History  Problem Relation Age of Onset   Heart failure Father  Hyperlipidemia Sister    Diabetes Sister    Hypertension Sister    Kidney failure Sister    Hyperlipidemia Sister    Diabetes Sister    Colon polyps Sister    Kidney disease Sister    Anuerysm Brother        AAA   Other Brother        COVID 19   Diabetes Maternal Grandmother    Diabetes Son    Heart disease Son    Heart attack Neg Hx    Colon cancer Neg Hx    Stomach cancer Neg Hx    Esophageal cancer Neg Hx    Pancreatic cancer Neg Hx      Social History[3]  ROS Refer to HPI for ROS details.  Objective:    Vitals: BP (!) 91/59 (BP Location: Left Arm)   Pulse 82   Temp 97.7 F (36.5 C) (Oral)   Resp 16   SpO2 98%   Physical Exam Vitals and nursing note reviewed.  Constitutional:      General: He is not in acute distress.    Appearance: Normal appearance. He is well-developed. He is not ill-appearing or toxic-appearing.  HENT:     Head: Normocephalic.     Mouth/Throat:     Mouth: Mucous membranes are moist.  Cardiovascular:     Rate and Rhythm: Normal rate.  Pulmonary:     Effort: Pulmonary effort is normal. No respiratory distress.     Breath sounds: No stridor. No wheezing.  Chest:     Chest wall: No tenderness.  Skin:    General: Skin is warm and dry.  Neurological:     General: No focal deficit present.     Mental Status: He is alert and oriented to person, place, and time.  Psychiatric:        Mood and Affect: Mood normal.        Behavior: Behavior normal.     Procedures  Results for orders placed or performed during the hospital encounter of 10/26/24 (from the past 24 hours)  POC rapid strep A     Status: Normal   Collection Time: 10/26/24  3:01 PM  Result Value Ref Range   Rapid Strep A Screen Negative Negative    Assessment and Plan :     Discharge Instructions       1. Acute viral pharyngitis (Primary) - POC rapid strep A completed today in UC is negative for strep pharyngitis - azelastine  (ASTELIN ) 0.1 % nasal spray; Place 1 spray into both nostrils 2 (two) times daily. Use in each nostril as directed  Dispense: 30 mL; Refill: 0 - predniSONE  (DELTASONE ) 20 MG tablet; Take 2 tablets (40 mg total) by mouth daily for 5 days.  Dispense: 10 tablet; Refill: 0 - Continue using nasal sinus saline rinse 2 flush any productive mucus out of the throat and nasal passages.  -Continue to monitor symptoms for any change in severity if there is any escalation of current symptoms or  development of new symptoms follow-up in ER for further evaluation and management.      Haneen Bernales B Shawntee Mainwaring    [1] No current facility-administered medications for this encounter.  Current Outpatient Medications:    azelastine  (ASTELIN ) 0.1 % nasal spray, Place 1 spray into both nostrils 2 (two) times daily. Use in each nostril as directed, Disp: 30 mL, Rfl: 0   predniSONE  (DELTASONE ) 20 MG tablet, Take 2 tablets (40 mg total) by mouth daily for 5  days., Disp: 10 tablet, Rfl: 0   ACCU-CHEK GUIDE TEST test strip, TEST BLOOD SUGAR IN THE MORNING, AT NOON AND AT BEDTIME, Disp: 300 strip, Rfl: 3   Accu-Chek Softclix Lancets lancets, TEST BLOOD SUGAR IN THE MORNING, AT NOON, AND AT BEDTIME AS DIRECTED, Disp: 300 each, Rfl: 3   acetaminophen  (TYLENOL ) 500 MG tablet, Take 500 mg by mouth as needed., Disp: , Rfl:    Ascorbic Acid (VITAMIN C PO), Take 1 tablet by mouth daily., Disp: , Rfl:    aspirin  EC 81 MG tablet, Take 1 tablet (81 mg total) by mouth daily., Disp: , Rfl:    atomoxetine  (STRATTERA ) 25 MG capsule, Take 2 capsules (50 mg total) by mouth daily., Disp: 180 capsule, Rfl: 1   Baclofen  5 MG TABS, Take 1-2 tablets (5-10 mg total) by mouth 3 (three) times daily. Since increasing the dose, increasing the amount- for spasticity- with robaxin  prn, Disp: 540 tablet, Rfl: 1   Blood Glucose Monitoring Suppl (ACCU-CHEK GUIDE) w/Device KIT, USE AS DIRECTED, Disp: 1 kit, Rfl: 3   Cyanocobalamin  (VITAMIN B-12 PO), Take 1 tablet by mouth daily., Disp: , Rfl:    diclofenac  Sodium (VOLTAREN ) 1 % GEL, Apply 2 g topically 2 (two) times daily as needed (Shoulder pain)., Disp: 100 g, Rfl: 0   docusate sodium  (COLACE) 100 MG capsule, Take 1 capsule (100 mg total) by mouth 2 (two) times daily., Disp: , Rfl:    ezetimibe  (ZETIA ) 10 MG tablet, TAKE 1 TABLET (10 MG TOTAL) BY MOUTH DAILY., Disp: 90 tablet, Rfl: 3   ferrous sulfate  325 (65 FE) MG tablet, Take 1 tablet (325 mg total) by mouth daily with breakfast.,  Disp: 30 tablet, Rfl: 0   fludrocortisone  (FLORINEF ) 0.1 MG tablet, Take 1 tablet (0.1 mg total) by mouth daily., Disp: 90 tablet, Rfl: 1   gabapentin  (NEURONTIN ) 300 MG capsule, Take 1 capsule (300 mg total) by mouth 3 (three) times daily as needed (severe nerve pain- could cause loopiness)., Disp: 270 capsule, Rfl: 1   lidocaine  (LIDODERM ) 5 %, Place 2 patches onto the skin daily. Remove & Discard patch within 12 hours or as directed by MD, Disp: 30 patch, Rfl: 5   meloxicam  (MOBIC ) 15 MG tablet, Take 15 mg by mouth daily., Disp: , Rfl:    metFORMIN  (GLUCOPHAGE -XR) 500 MG 24 hr tablet, Take 4 tablets (2,000 mg total) by mouth daily. Take 2 tablets by mouth twice daily, Disp: 360 tablet, Rfl: 3   methocarbamol  (ROBAXIN ) 500 MG tablet, Take 2 tablets (1,000 mg total) by mouth every 8 (eight) hours as needed for muscle spasms (can take with baclofen - for different reasons- spasms vs spasticity). With baclofen , Disp: 540 tablet, Rfl: 1   midodrine  (PROAMATINE ) 10 MG tablet, TAKE 1 TABLET THREE TIMES DAILY WITH MEALS, Disp: 270 tablet, Rfl: 3   Multiple Vitamin (MULTIVITAMIN) tablet, Take 1 tablet by mouth daily., Disp: , Rfl:    nitroGLYCERIN  (NITROSTAT ) 0.4 MG SL tablet, Place 1 tablet (0.4 mg total) under the tongue every 5 (five) minutes as needed for chest pain., Disp: 25 tablet, Rfl: 1   Omega-3 Fatty Acids (FISH OIL PO), Take 1 capsule by mouth daily., Disp: , Rfl:    pantoprazole  (PROTONIX ) 40 MG tablet, Take 1 tablet (40 mg total) by mouth 2 (two) times daily. Please schedule an office visit for further refills. Thank you, Disp: 60 tablet, Rfl: 0   polyethylene glycol (MIRALAX  / GLYCOLAX ) 17 g packet, Take 17 g by mouth daily.,  Disp: , Rfl:    ranolazine  (RANEXA ) 500 MG 12 hr tablet, TAKE 1 TABLET TWICE DAILY, Disp: 180 tablet, Rfl: 3   rosuvastatin  (CRESTOR ) 40 MG tablet, Take 1 tablet (40 mg total) by mouth daily., Disp: 30 tablet, Rfl: 0   Semaglutide , 2 MG/DOSE, 8 MG/3ML SOPN, Inject 2 mg as  directed once a week., Disp: 9 mL, Rfl: 3   sulfamethoxazole -trimethoprim  (BACTRIM  DS) 800-160 MG tablet, Take 1 tablet by mouth 2 (two) times daily., Disp: 20 tablet, Rfl: 0   traMADol  (ULTRAM ) 50 MG tablet, Take 1 tablet (50 mg total) by mouth every 6 (six) hours as needed for severe pain (pain score 7-10). (Patient not taking: Reported on 09/09/2024), Disp: 30 tablet, Rfl: 0   VITAMIN E PO, Take 1 capsule by mouth daily., Disp: , Rfl:  [2]  Allergies Allergen Reactions   Oxycodone  Other (See Comments)    Pt gets confused/loopy on Oxycodone - he wants to avoid  [3]  Social History Tobacco Use   Smoking status: Former    Current packs/[place]: 0.00    Types: Cigarettes    Quit date: 1974    Years since quitting: 52.1   Smokeless tobacco: Never  Vaping Use   Vaping status: Never Used  Substance Use Topics   Alcohol use: Yes    Alcohol/week: 3.0 - 4.0 standard drinks of alcohol    Types: 3 - 4 Standard drinks or equivalent per week   Drug use: Never     Aurea Goodell B, NP 10/26/24 1527  "

## 2024-10-26 NOTE — ED Triage Notes (Signed)
 Pt states when he woke up this morning he had phlegm stuck in his throat.  States he has been able to cough some of it up but states he feels like it is covering the back of his throat.  Pt denies any difficulty breathing and is in no respiratory distress.

## 2024-10-26 NOTE — Discharge Instructions (Addendum)
" °  1. Acute viral pharyngitis (Primary) - POC rapid strep A completed today in UC is negative for strep pharyngitis - azelastine  (ASTELIN ) 0.1 % nasal spray; Place 1 spray into both nostrils 2 (two) times daily. Use in each nostril as directed  Dispense: 30 mL; Refill: 0 - predniSONE  (DELTASONE ) 20 MG tablet; Take 2 tablets (40 mg total) by mouth daily for 5 days.  Dispense: 10 tablet; Refill: 0 - Continue using nasal sinus saline rinse 2 flush any productive mucus out of the throat and nasal passages.  -Continue to monitor symptoms for any change in severity if there is any escalation of current symptoms or development of new symptoms follow-up in ER for further evaluation and management. "

## 2024-10-31 ENCOUNTER — Other Ambulatory Visit (HOSPITAL_COMMUNITY): Payer: Self-pay

## 2024-11-01 ENCOUNTER — Ambulatory Visit: Admitting: Occupational Therapy

## 2024-11-01 ENCOUNTER — Encounter: Payer: Self-pay | Admitting: Occupational Therapy

## 2024-11-01 DIAGNOSIS — R29818 Other symptoms and signs involving the nervous system: Secondary | ICD-10-CM

## 2024-11-01 DIAGNOSIS — R278 Other lack of coordination: Secondary | ICD-10-CM

## 2024-11-01 DIAGNOSIS — R29898 Other symptoms and signs involving the musculoskeletal system: Secondary | ICD-10-CM

## 2024-11-01 DIAGNOSIS — M6281 Muscle weakness (generalized): Secondary | ICD-10-CM

## 2024-11-01 DIAGNOSIS — R208 Other disturbances of skin sensation: Secondary | ICD-10-CM

## 2024-11-01 NOTE — Patient Instructions (Signed)
 SHOULDER: Flexion - Sitting    Hold cane with both hands. Raise arms up. Keep elbows straight. Hold _3__ seconds.  _10__ reps per set, _2__ sets per [place]   ROM: Abduction - Wand    Holding wand with left hand thumb/palm up, push wand directly out to side at an angle, leading with other hand palm down, until stretch is felt. Hold _3___ seconds. Repeat __10__ times per set. Then repeat to other side x 10 reps with that thumb/palm up. Do __2__ sets per Rowser.

## 2024-11-02 ENCOUNTER — Other Ambulatory Visit: Payer: Self-pay | Admitting: Physical Medicine and Rehabilitation

## 2024-11-03 ENCOUNTER — Ambulatory Visit: Payer: Self-pay | Admitting: Physical Therapy

## 2024-11-04 ENCOUNTER — Other Ambulatory Visit: Payer: Self-pay | Admitting: Physical Medicine and Rehabilitation

## 2024-11-04 ENCOUNTER — Encounter: Payer: Self-pay | Admitting: Family Medicine

## 2024-11-04 ENCOUNTER — Ambulatory Visit: Admitting: Family Medicine

## 2024-11-04 VITALS — BP 110/76 | HR 88 | Temp 98.9°F | Wt 174.0 lb

## 2024-11-04 DIAGNOSIS — N390 Urinary tract infection, site not specified: Secondary | ICD-10-CM

## 2024-11-04 DIAGNOSIS — H1031 Unspecified acute conjunctivitis, right eye: Secondary | ICD-10-CM

## 2024-11-04 LAB — POC URINALSYSI DIPSTICK (AUTOMATED)
Glucose, UA: NEGATIVE
Ketones, UA: NEGATIVE
Protein, UA: POSITIVE — AB
Spec Grav, UA: <=1.005 — AB
Urobilinogen, UA: NEGATIVE U/dL — AB
pH, UA: 8.5 — AB

## 2024-11-04 MED ORDER — SULFAMETHOXAZOLE-TRIMETHOPRIM 800-160 MG PO TABS: 1.0000 | ORAL_TABLET | Freq: Two times a day (BID) | ORAL | 0 refills | Status: AC

## 2024-11-04 MED ORDER — TOBRAMYCIN 0.3 % OP SOLN: 2.0000 [drp] | OPHTHALMIC | 0 refills | Status: AC

## 2024-11-04 NOTE — Addendum Note (Signed)
 Addended by: LADONNA INOCENTE SAILOR on: 11/04/2024 03:15 PM   Modules accepted: Orders

## 2024-11-04 NOTE — Progress Notes (Signed)
" ° °  Subjective:    Patient ID: Jerome Vaughn, male    DOB: 01-21-1951, 74 y.o.   MRN: [id number]  HPI Here for 2 issues. First his urine has been dark and cloudy for several days. He also has urgency and burning. No fever. He drinks plenty of water. He had an E coli UTI in August and this was cleared with Cipro. He then saw us  in November for another E coli UTI, and this was treated with Bactrim  DS. He sees Dr. Lovie for urologic care, and he last saw him on 10-27-24. His urine was clear that Jerome Vaughn. Dr. 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 The second issue is redness and burning in the right eye for the past 2 days. There has been a lot of mucus buildup. His vision is fine. No photophobia.    Review of Systems  Constitutional: Negative.   Cardiovascular: Negative.   Genitourinary:  Positive for dysuria, frequency and urgency. Negative for flank pain and hematuria.       Objective:   Physical Exam Constitutional:      Appearance: Normal appearance.  Eyes:     Pupils: Pupils are equal, round, and reactive to light.     Comments: Right conjunctiva is pink. The left is clear   Cardiovascular:     Rate and Rhythm: Normal rate and regular rhythm.     Pulses: Normal pulses.     Heart sounds: Normal heart sounds.  Pulmonary:     Effort: Pulmonary effort is normal.     Breath sounds: Normal breath sounds.  Abdominal:     Tenderness: There is no right CVA tenderness or left CVA tenderness.  Neurological:     Mental Status: He is alert.           Assessment & Plan:  Recurrent UTI. We will culture the sample today. Treat with 10 days of Bactrim  DS. I advised him to let Dr. Lovie know what is going on. He also has a conjunctivitis, and we will treat this with Tobramycin  drops.  Garnette Olmsted, MD   "

## 2024-11-08 ENCOUNTER — Ambulatory Visit: Payer: Self-pay | Admitting: Physical Therapy

## 2024-11-17 ENCOUNTER — Ambulatory Visit

## 2024-11-22 ENCOUNTER — Ambulatory Visit

## 2024-11-24 ENCOUNTER — Ambulatory Visit: Admitting: Physical Therapy

## 2024-11-28 ENCOUNTER — Ambulatory Visit: Admitting: Occupational Therapy

## 2024-12-09 ENCOUNTER — Encounter: Admitting: Physical Medicine and Rehabilitation

## 2024-12-26 ENCOUNTER — Ambulatory Visit: Admitting: Podiatry

## 2025-01-20 ENCOUNTER — Ambulatory Visit: Admitting: Internal Medicine

## 2025-01-27 ENCOUNTER — Encounter: Attending: Physical Medicine and Rehabilitation | Admitting: Physical Medicine and Rehabilitation
